# Patient Record
Sex: Male | Born: 1955
Health system: Southern US, Community
[De-identification: ages and names within clinical notes are randomized; demographics above are authoritative.]

## PROBLEM LIST (undated history)

## (undated) DIAGNOSIS — Z9229 Personal history of other drug therapy: Secondary | ICD-10-CM

## (undated) DIAGNOSIS — I2510 Atherosclerotic heart disease of native coronary artery without angina pectoris: Secondary | ICD-10-CM

## (undated) DIAGNOSIS — I480 Paroxysmal atrial fibrillation: Secondary | ICD-10-CM

## (undated) DIAGNOSIS — F1010 Alcohol abuse, uncomplicated: Secondary | ICD-10-CM

## (undated) DIAGNOSIS — I700 Atherosclerosis of aorta: Secondary | ICD-10-CM

## (undated) DIAGNOSIS — E785 Hyperlipidemia, unspecified: Secondary | ICD-10-CM

## (undated) DIAGNOSIS — Z91148 Patient's other noncompliance with medication regimen for other reason: Secondary | ICD-10-CM

## (undated) DIAGNOSIS — I4891 Unspecified atrial fibrillation: Secondary | ICD-10-CM

## (undated) DIAGNOSIS — I639 Cerebral infarction, unspecified: Secondary | ICD-10-CM

## (undated) DIAGNOSIS — Z720 Tobacco use: Secondary | ICD-10-CM

## (undated) DIAGNOSIS — Z7901 Long term (current) use of anticoagulants: Secondary | ICD-10-CM

## (undated) DIAGNOSIS — N189 Chronic kidney disease, unspecified: Secondary | ICD-10-CM

## (undated) DIAGNOSIS — I10 Essential (primary) hypertension: Secondary | ICD-10-CM

## (undated) DIAGNOSIS — K219 Gastro-esophageal reflux disease without esophagitis: Secondary | ICD-10-CM

## (undated) DIAGNOSIS — Z79899 Other long term (current) drug therapy: Secondary | ICD-10-CM

## (undated) DIAGNOSIS — J449 Chronic obstructive pulmonary disease, unspecified: Secondary | ICD-10-CM

## (undated) DIAGNOSIS — M4802 Spinal stenosis, cervical region: Secondary | ICD-10-CM

## (undated) HISTORY — DX: Hyperlipidemia, unspecified: E78.5

## (undated) HISTORY — DX: Tobacco use: Z72.0

## (undated) HISTORY — PX: TONSILLECTOMY: SUR1361

## (undated) HISTORY — DX: Chronic kidney disease, unspecified: N18.9

## (undated) HISTORY — DX: Other long term (current) drug therapy: Z79.899

## (undated) HISTORY — DX: Unspecified atrial fibrillation: I48.91

## (undated) HISTORY — DX: Essential (primary) hypertension: I10

---

## 1998-10-18 ENCOUNTER — Emergency Department (HOSPITAL_COMMUNITY): Admission: EM | Admit: 1998-10-18 | Discharge: 1998-10-18 | Payer: Self-pay | Admitting: Emergency Medicine

## 1998-10-18 ENCOUNTER — Encounter: Payer: Self-pay | Admitting: Emergency Medicine

## 2000-09-10 ENCOUNTER — Emergency Department (HOSPITAL_COMMUNITY): Admission: EM | Admit: 2000-09-10 | Discharge: 2000-09-10 | Payer: Self-pay | Admitting: Emergency Medicine

## 2000-09-10 ENCOUNTER — Encounter: Payer: Self-pay | Admitting: Emergency Medicine

## 2006-12-23 ENCOUNTER — Emergency Department (HOSPITAL_COMMUNITY): Admission: EM | Admit: 2006-12-23 | Discharge: 2006-12-23 | Payer: Self-pay | Admitting: Emergency Medicine
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
## 2009-12-30 ENCOUNTER — Emergency Department (HOSPITAL_COMMUNITY): Admission: EM | Admit: 2009-12-30 | Discharge: 2009-12-30 | Payer: Self-pay | Admitting: Emergency Medicine

## 2010-03-22 ENCOUNTER — Encounter (INDEPENDENT_AMBULATORY_CARE_PROVIDER_SITE_OTHER): Payer: Self-pay | Admitting: Family Medicine

## 2010-03-22 ENCOUNTER — Ambulatory Visit: Payer: Self-pay | Admitting: Internal Medicine

## 2010-03-22 LAB — CONVERTED CEMR LAB
ALT: 18 units/L (ref 0–53)
AST: 23 units/L (ref 0–37)
Albumin: 4.4 g/dL (ref 3.5–5.2)
Alkaline Phosphatase: 58 units/L (ref 39–117)
BUN: 12 mg/dL (ref 6–23)
Basophils Absolute: 0.1 10*3/uL (ref 0.0–0.1)
Basophils Relative: 1 % (ref 0–1)
CO2: 24 meq/L (ref 19–32)
Calcium: 9 mg/dL (ref 8.4–10.5)
Chloride: 104 meq/L (ref 96–112)
Cholesterol: 143 mg/dL (ref 0–200)
Creatinine, Ser: 1.01 mg/dL (ref 0.40–1.50)
Eosinophils Absolute: 0.1 10*3/uL (ref 0.0–0.7)
Eosinophils Relative: 2 % (ref 0–5)
Glucose, Bld: 84 mg/dL (ref 70–99)
HCT: 45.9 % (ref 39.0–52.0)
HDL: 48 mg/dL (ref >39)
Hemoglobin: 15 g/dL (ref 13.0–17.0)
LDL Cholesterol: 83 mg/dL (ref 0–99)
Lymphocytes Relative: 13 % (ref 12–46)
Lymphs Abs: 0.9 10*3/uL (ref 0.7–4.0)
MCHC: 32.7 g/dL (ref 30.0–36.0)
MCV: 98.5 fL (ref 78.0–100.0)
Monocytes Absolute: 0.6 10*3/uL (ref 0.1–1.0)
Monocytes Relative: 8 % (ref 3–12)
Neutro Abs: 5.6 10*3/uL (ref 1.7–7.7)
Neutrophils Relative %: 77 % (ref 43–77)
PSA: 0.54 ng/mL (ref 0.10–4.00)
Platelets: 253 10*3/uL (ref 150–400)
Potassium: 4.2 meq/L (ref 3.5–5.3)
RBC: 4.66 M/uL (ref 4.22–5.81)
RDW: 14.3 % (ref 11.5–15.5)
Sodium: 141 meq/L (ref 135–145)
Total Bilirubin: 0.7 mg/dL (ref 0.3–1.2)
Total CHOL/HDL Ratio: 3
Total Protein: 7.1 g/dL (ref 6.0–8.3)
Triglycerides: 61 mg/dL (ref <150)
VLDL: 12 mg/dL (ref 0–40)
Vit D, 25-Hydroxy: 29 ng/mL — ABNORMAL LOW (ref 30–89)
WBC: 7.3 10*3/uL (ref 4.0–10.5)

## 2010-04-05 ENCOUNTER — Encounter (INDEPENDENT_AMBULATORY_CARE_PROVIDER_SITE_OTHER): Payer: Self-pay | Admitting: Family Medicine

## 2010-04-05 ENCOUNTER — Ambulatory Visit: Payer: Self-pay | Admitting: Internal Medicine

## 2010-04-05 LAB — CONVERTED CEMR LAB
BUN: 14 mg/dL (ref 6–23)
CO2: 27 meq/L (ref 19–32)
Calcium: 9.6 mg/dL (ref 8.4–10.5)
Chloride: 97 meq/L (ref 96–112)
Creatinine, Ser: 1.16 mg/dL (ref 0.40–1.50)
Glucose, Bld: 87 mg/dL (ref 70–99)
Potassium: 4.5 meq/L (ref 3.5–5.3)
Sodium: 135 meq/L (ref 135–145)

## 2010-06-29 ENCOUNTER — Telehealth (INDEPENDENT_AMBULATORY_CARE_PROVIDER_SITE_OTHER): Payer: Self-pay | Admitting: *Deleted

## 2010-06-29 ENCOUNTER — Emergency Department (HOSPITAL_COMMUNITY): Admission: EM | Admit: 2010-06-29 | Discharge: 2010-06-29 | Payer: Self-pay | Admitting: Family Medicine

## 2010-07-05 ENCOUNTER — Encounter (INDEPENDENT_AMBULATORY_CARE_PROVIDER_SITE_OTHER): Payer: Self-pay | Admitting: Family Medicine

## 2010-07-05 ENCOUNTER — Ambulatory Visit: Payer: Self-pay | Admitting: Internal Medicine

## 2010-07-05 LAB — CONVERTED CEMR LAB
BUN: 17 mg/dL (ref 6–23)
CO2: 25 meq/L (ref 19–32)
Calcium: 9.7 mg/dL (ref 8.4–10.5)
Creatinine, Ser: 1.19 mg/dL (ref 0.40–1.50)
Glucose, Bld: 80 mg/dL (ref 70–99)

## 2010-10-20 ENCOUNTER — Encounter (INDEPENDENT_AMBULATORY_CARE_PROVIDER_SITE_OTHER): Payer: Self-pay | Admitting: *Deleted

## 2011-01-20 NOTE — Progress Notes (Signed)
Summary: triage/swollen face/broken tooth/pain  Phone Note Call from Patient   Caller: Patient Reason for Call: Talk to Nurse Summary of Call: Patient states he has a broken front tooth and is in alot of pain...he says his face is swollen and has a hard time breathing out of his nose.  Patient wanting appointment today. Patient advised to go to urgent care since we already have all our acute slots filled and there are no openings in our schedule and the possibility of increasing swelling and compromised airway. Patient agrees and will call and follow up with [place] if needed. Initial call taken by: Conchita Paris,  June 29, 2010 9:23 AM

## 2012-05-16 ENCOUNTER — Ambulatory Visit (HOSPITAL_COMMUNITY)
Admission: RE | Admit: 2012-05-16 | Discharge: 2012-05-16 | Disposition: A | Discharge status: Home / Self Care | Payer: Self-pay | Source: Ambulatory Visit | Attending: Family Medicine | Admitting: Family Medicine

## 2012-05-16 DIAGNOSIS — I10 Essential (primary) hypertension: Secondary | ICD-10-CM | POA: Insufficient documentation

## 2012-05-16 DIAGNOSIS — R42 Dizziness and giddiness: Secondary | ICD-10-CM | POA: Insufficient documentation

## 2012-05-16 DIAGNOSIS — R55 Syncope and collapse: Secondary | ICD-10-CM

## 2012-05-16 DIAGNOSIS — I951 Orthostatic hypotension: Secondary | ICD-10-CM

## 2012-05-16 DIAGNOSIS — F17200 Nicotine dependence, unspecified, uncomplicated: Secondary | ICD-10-CM | POA: Insufficient documentation

## 2012-05-16 NOTE — Progress Notes (Signed)
VASCULAR LAB PRELIMINARY  PRELIMINARY  PRELIMINARY  PRELIMINARY  Carotid duplex completed.    Preliminary report: Bilateral:  No evidence of hemodynamically significant internal carotid artery stenosis.   Vertebral artery flow is antegrade.     Azariel Banik D, RVS 05/16/2012, 1:54 PM

## 2013-02-21 ENCOUNTER — Other Ambulatory Visit: Payer: Self-pay | Admitting: Radiology

## 2013-03-15 ENCOUNTER — Encounter: Payer: Self-pay | Admitting: Cardiology

## 2013-03-15 ENCOUNTER — Ambulatory Visit (INDEPENDENT_AMBULATORY_CARE_PROVIDER_SITE_OTHER): Payer: No Typology Code available for payment source | Admitting: Cardiology

## 2013-03-15 VITALS — BP 118/82 | HR 82 | Ht 73.0 in | Wt 175.2 lb

## 2013-03-15 DIAGNOSIS — R55 Syncope and collapse: Secondary | ICD-10-CM

## 2013-03-15 DIAGNOSIS — E7800 Pure hypercholesterolemia, unspecified: Secondary | ICD-10-CM

## 2013-03-15 DIAGNOSIS — R9431 Abnormal electrocardiogram [ECG] [EKG]: Secondary | ICD-10-CM

## 2013-03-15 NOTE — Progress Notes (Signed)
HPI The patient presents for evaluation of syncope. He has had no prior cardiovascular history. However, he's had 3 episodes of near syncope. He's not quite sure that he lost consciousness completely although one time he hit his head. All 3 episodes have been when he has been lying down and jumps up suddenly. He'll make his several feet and then have a near loss of consciousness. He recovers quickly. He otherwise has not numbness and can be quite active without bringing on any symptoms. He has no chest pain, neck or arm discomfort. He has no palpitations. He has no shortness of breath, PND or orthopnea. He's not been exercising but he has been working particularly recently vigorously without bringing on any symptoms. He is trying to quit smoking. He did see his provider recently. I have reviewed these records.  Of note the patient does describe a chronic and constant tinglingin his left hand without pain or weakness. He reports some neck pain as well with certain positions. He is apparently scheduled to see a neurologist.   No Known Allergies  Current Outpatient Prescriptions  Medication Sig Dispense Refill  . aspirin 81 MG tablet Take 81 mg by mouth daily.      Marland Kitchen ibuprofen (ADVIL,MOTRIN) 200 MG tablet Take 200 mg by mouth every 6 (six) hours as needed for pain.      Marland Kitchen lisinopril (PRINIVIL,ZESTRIL) 20 MG tablet Take 20 mg by mouth daily.       No current facility-administered medications for this visit.    Past Medical History  Diagnosis Date  . Chest pain   . Hypertension   . Limb pain   . Numbness     Past Surgical History  Procedure Laterality Date  . Tonsillectomy      No family history on file.  History   Social History  . Marital Status: Divorced    Spouse Name: N/A    Number of Children: N/A  . Years of Education: N/A   Occupational History  . Not on file.   Social History Main Topics  . Smoking status: Current Every Day Smoker -- 1.00 packs/day for 40 years   Types: Cigarettes  . Smokeless tobacco: Not on file     Comment: pt is using the vapor cigeratte  . Alcohol Use: Yes  . Drug Use: Not on file  . Sexually Active: Not on file   Other Topics Concern  . Not on file   Social History Narrative  . No narrative on file    ROS: positive for dizziness, cough with sputum production, occasional reflux, urinary frequency, hand cramping. Otherwise as stated in the HPI and negative for all other systems.   PHYSICAL EXAM BP 118/82  Pulse 82  Ht 6\' 1"  (1.854 m)  Wt 175 lb 3.2 oz (79.47 kg)  BMI 23.12 kg/m2  SpO2 97% GENERAL:  Well appearing HEENT:  Pupils equal round and reactive, fundi not visualized, oral mucosa unremarkable NECK:  No jugular venous distention, waveform within normal limits, carotid upstroke brisk and symmetric, no bruits, no thyromegaly LYMPHATICS:  No cervical, inguinal adenopathy LUNGS:   Decreased breath sounds, no wheezing.   BACK:  No CVA tenderness CHEST:  Unremarkable HEART:  PMI not displaced or sustained,S1 and S2 within normal limits, no S3, no S4, no clicks, no rubs, no murmurs ABD:  Flat, positive bowel sounds normal in frequency in pitch, no bruits, no rebound, no guarding, no midline pulsatile mass, no hepatomegaly, no splenomegaly EXT:  2 plus  pulses throughout, no edema, no cyanosis no clubbing SKIN:  No rashes no nodules NEURO:  Cranial nerves II through XII grossly intact, motor grossly intact throughout PSYCH:  Cognitively intact, oriented to person place and time  EKG:  Sinus rhythm, , rate 73, axis within normal limits, intervals within the limits except for borderline QTC, premature ventricular contractions, no acute ST-T wave changes.  03/15/2013   ASSESSMENT AND PLAN  SYNCOPE:  This is clearly related to orthostasis. He has learned how to avoid this. No further evaluation is indicated.  TOBACCO ABUSE:  We had a long discussion about this. He's going to try to quit but wouldn't be a little  forward any of the replacement therapies other than the cigarette he is currently doing.  RISK REDUCTION:  Given his family history and risk factors I think exercise treadmill testing would be indicated. However, this would be a substantial cost burden for this patient. He does agree to come back for fasting lipid profile.

## 2013-03-15 NOTE — Patient Instructions (Addendum)
The current medical regimen is effective;  continue present plan and medications.  Please return fasting for lipid profile.  Follow up as needed.  You Can Quit Smoking If you are ready to quit smoking or are thinking about it, congratulations! You have chosen to help yourself be healthier and live longer! There are lots of different ways to quit smoking. Nicotine gum, nicotine patches, a nicotine inhaler, or nicotine nasal spray can help with physical craving. Hypnosis, support groups, and medicines help break the habit of smoking. TIPS TO GET OFF AND STAY OFF CIGARETTES  Learn to predict your moods. Do not let a bad situation be your excuse to have a cigarette. Some situations in your life might tempt you to have a cigarette.  Ask friends and co-workers not to smoke around you.  Make your home smoke-free.  Never have "just one" cigarette. It leads to wanting another and another. Remind yourself of your decision to quit.  On a card, make a list of your reasons for not smoking. Read it at least the same number of times a day as you have a cigarette. Tell yourself everyday, "I do not want to smoke. I choose not to smoke."  Ask someone at home or work to help you with your plan to quit smoking.  Have something planned after you eat or have a cup of coffee. Take a walk or get other exercise to perk you up. This will help to keep you from overeating.  Try a relaxation exercise to calm you down and decrease your stress. Remember, you may be tense and nervous the first two weeks after you quit. This will pass.  Find new activities to keep your hands busy. Play with a pen, coin, or rubber band. Doodle or draw things on paper.  Brush your teeth right after eating. This will help cut down the craving for the taste of tobacco after meals. You can try mouthwash too.  Try gum, breath mints, or diet candy to keep something in your mouth. IF YOU SMOKE AND WANT TO QUIT:  Do not stock up on  cigarettes. Never buy a carton. Wait until one pack is finished before you buy another.  Never carry cigarettes with you at work or at home.  Keep cigarettes as far away from you as possible. Leave them with someone else.  Never carry matches or a lighter with you.  Ask yourself, "Do I need this cigarette or is this just a reflex?"  Bet with someone that you can quit. Put cigarette money in a piggy bank every morning. If you smoke, you give up the money. If you do not smoke, by the end of the week, you keep the money.  Keep trying. It takes 21 days to change a habit!  Talk to your doctor about using medicines to help you quit. These include nicotine replacement gum, lozenges, or skin patches. Document Released: 10/01/2009 Document Revised: 02/27/2012 Document Reviewed: 10/01/2009 [facility] Patient Information 2013 [place], [place].

## 2013-03-17 DIAGNOSIS — R55 Syncope and collapse: Secondary | ICD-10-CM | POA: Insufficient documentation

## 2013-03-27 ENCOUNTER — Encounter: Payer: Self-pay | Admitting: Cardiology

## 2013-04-12 DIAGNOSIS — I10 Essential (primary) hypertension: Secondary | ICD-10-CM | POA: Insufficient documentation

## 2014-06-12 ENCOUNTER — Encounter (HOSPITAL_COMMUNITY): Payer: Self-pay | Admitting: Emergency Medicine

## 2014-06-12 ENCOUNTER — Emergency Department (HOSPITAL_COMMUNITY): Payer: PRIVATE HEALTH INSURANCE

## 2014-06-12 ENCOUNTER — Emergency Department (INDEPENDENT_AMBULATORY_CARE_PROVIDER_SITE_OTHER)
Admission: EM | Admit: 2014-06-12 | Discharge: 2014-06-12 | Disposition: A | Discharge status: Hospice | Payer: PRIVATE HEALTH INSURANCE | Source: Home / Self Care | Attending: Family Medicine | Admitting: Family Medicine

## 2014-06-12 ENCOUNTER — Inpatient Hospital Stay (HOSPITAL_COMMUNITY)
Admission: EM | Admit: 2014-06-12 | Discharge: 2014-06-13 | DRG: 684 | Disposition: A | Discharge status: Home / Self Care | Payer: PRIVATE HEALTH INSURANCE | Attending: Internal Medicine | Admitting: Internal Medicine

## 2014-06-12 DIAGNOSIS — N179 Acute kidney failure, unspecified: Principal | ICD-10-CM | POA: Diagnosis present

## 2014-06-12 DIAGNOSIS — R55 Syncope and collapse: Secondary | ICD-10-CM

## 2014-06-12 DIAGNOSIS — I10 Essential (primary) hypertension: Secondary | ICD-10-CM | POA: Diagnosis present

## 2014-06-12 DIAGNOSIS — X30XXXA Exposure to excessive natural heat, initial encounter: Secondary | ICD-10-CM | POA: Diagnosis present

## 2014-06-12 DIAGNOSIS — T671XXA Heat syncope, initial encounter: Secondary | ICD-10-CM | POA: Diagnosis present

## 2014-06-12 DIAGNOSIS — I951 Orthostatic hypotension: Secondary | ICD-10-CM | POA: Diagnosis present

## 2014-06-12 DIAGNOSIS — E860 Dehydration: Secondary | ICD-10-CM | POA: Diagnosis present

## 2014-06-12 DIAGNOSIS — F17200 Nicotine dependence, unspecified, uncomplicated: Secondary | ICD-10-CM | POA: Diagnosis present

## 2014-06-12 DIAGNOSIS — R9431 Abnormal electrocardiogram [ECG] [EKG]: Secondary | ICD-10-CM | POA: Diagnosis present

## 2014-06-12 LAB — URINALYSIS, ROUTINE W REFLEX MICROSCOPIC
Bilirubin Urine: NEGATIVE
Glucose, UA: NEGATIVE mg/dL
Ketones, ur: NEGATIVE mg/dL
Leukocytes, UA: NEGATIVE
NITRITE: NEGATIVE
PROTEIN: 30 mg/dL — AB
Specific Gravity, Urine: 1.015 (ref 1.005–1.030)
UROBILINOGEN UA: 0.2 mg/dL (ref 0.0–1.0)
pH: 5 (ref 5.0–8.0)

## 2014-06-12 LAB — CBC WITH DIFFERENTIAL/PLATELET
Basophils Absolute: 0 10*3/uL (ref 0.0–0.1)
Basophils Relative: 0 % (ref 0–1)
EOS PCT: 1 % (ref 0–5)
Eosinophils Absolute: 0.1 10*3/uL (ref 0.0–0.7)
HCT: 41.8 % (ref 39.0–52.0)
Hemoglobin: 14.6 g/dL (ref 13.0–17.0)
LYMPHS ABS: 1.3 10*3/uL (ref 0.7–4.0)
LYMPHS PCT: 15 % (ref 12–46)
MCH: 33.3 pg (ref 26.0–34.0)
MCHC: 34.9 g/dL (ref 30.0–36.0)
MCV: 95.2 fL (ref 78.0–100.0)
MONOS PCT: 13 % — AB (ref 3–12)
Monocytes Absolute: 1.1 10*3/uL — ABNORMAL HIGH (ref 0.1–1.0)
Neutro Abs: 5.9 10*3/uL (ref 1.7–7.7)
Neutrophils Relative %: 71 % (ref 43–77)
Platelets: 271 10*3/uL (ref 150–400)
RBC: 4.39 MIL/uL (ref 4.22–5.81)
RDW: 12.8 % (ref 11.5–15.5)
WBC: 8.4 10*3/uL (ref 4.0–10.5)

## 2014-06-12 LAB — POCT I-STAT, CHEM 8
BUN: 41 mg/dL — ABNORMAL HIGH (ref 6–23)
CREATININE: 5.9 mg/dL — AB (ref 0.50–1.35)
Calcium, Ion: 1.25 mmol/L — ABNORMAL HIGH (ref 1.12–1.23)
Chloride: 103 mEq/L (ref 96–112)
Glucose, Bld: 96 mg/dL (ref 70–99)
HCT: 50 % (ref 39.0–52.0)
Hemoglobin: 17 g/dL (ref 13.0–17.0)
Potassium: 4.4 mEq/L (ref 3.7–5.3)
SODIUM: 134 meq/L — AB (ref 137–147)
TCO2: 18 mmol/L (ref 0–100)

## 2014-06-12 LAB — COMPREHENSIVE METABOLIC PANEL
ALT: 14 U/L (ref 0–53)
AST: 24 U/L (ref 0–37)
Albumin: 4.6 g/dL (ref 3.5–5.2)
Alkaline Phosphatase: 60 U/L (ref 39–117)
BILIRUBIN TOTAL: 0.4 mg/dL (ref 0.3–1.2)
BUN: 43 mg/dL — AB (ref 6–23)
CALCIUM: 9.4 mg/dL (ref 8.4–10.5)
CO2: 20 meq/L (ref 19–32)
CREATININE: 4.98 mg/dL — AB (ref 0.50–1.35)
Chloride: 95 mEq/L — ABNORMAL LOW (ref 96–112)
GFR, EST AFRICAN AMERICAN: 13 mL/min — AB (ref >90)
GFR, EST NON AFRICAN AMERICAN: 12 mL/min — AB (ref >90)
GLUCOSE: 91 mg/dL (ref 70–99)
Potassium: 4.8 mEq/L (ref 3.7–5.3)
Sodium: 134 mEq/L — ABNORMAL LOW (ref 137–147)
Total Protein: 7.9 g/dL (ref 6.0–8.3)

## 2014-06-12 LAB — I-STAT TROPONIN, ED: TROPONIN I, POC: 0 ng/mL (ref 0.00–0.08)

## 2014-06-12 LAB — CK TOTAL AND CKMB (NOT AT ARMC)
CK TOTAL: 172 U/L (ref 7–232)
CK, MB: 4.7 ng/mL — ABNORMAL HIGH (ref 0.3–4.0)
Relative Index: 2.7 — ABNORMAL HIGH (ref 0.0–2.5)

## 2014-06-12 LAB — URINE MICROSCOPIC-ADD ON

## 2014-06-12 LAB — CBG MONITORING, ED: Glucose-Capillary: 83 mg/dL (ref 70–99)

## 2014-06-12 LAB — CK: Total CK: 174 U/L (ref 7–232)

## 2014-06-12 IMAGING — CR DG CHEST 2V
2 series · 2 of 2 positions shown · non-contrast
Comparison: Chest x-ray [DATE].

CLINICAL DATA: Loss of consciousness.

EXAM:
CHEST  2 VIEW

[w chest pa]
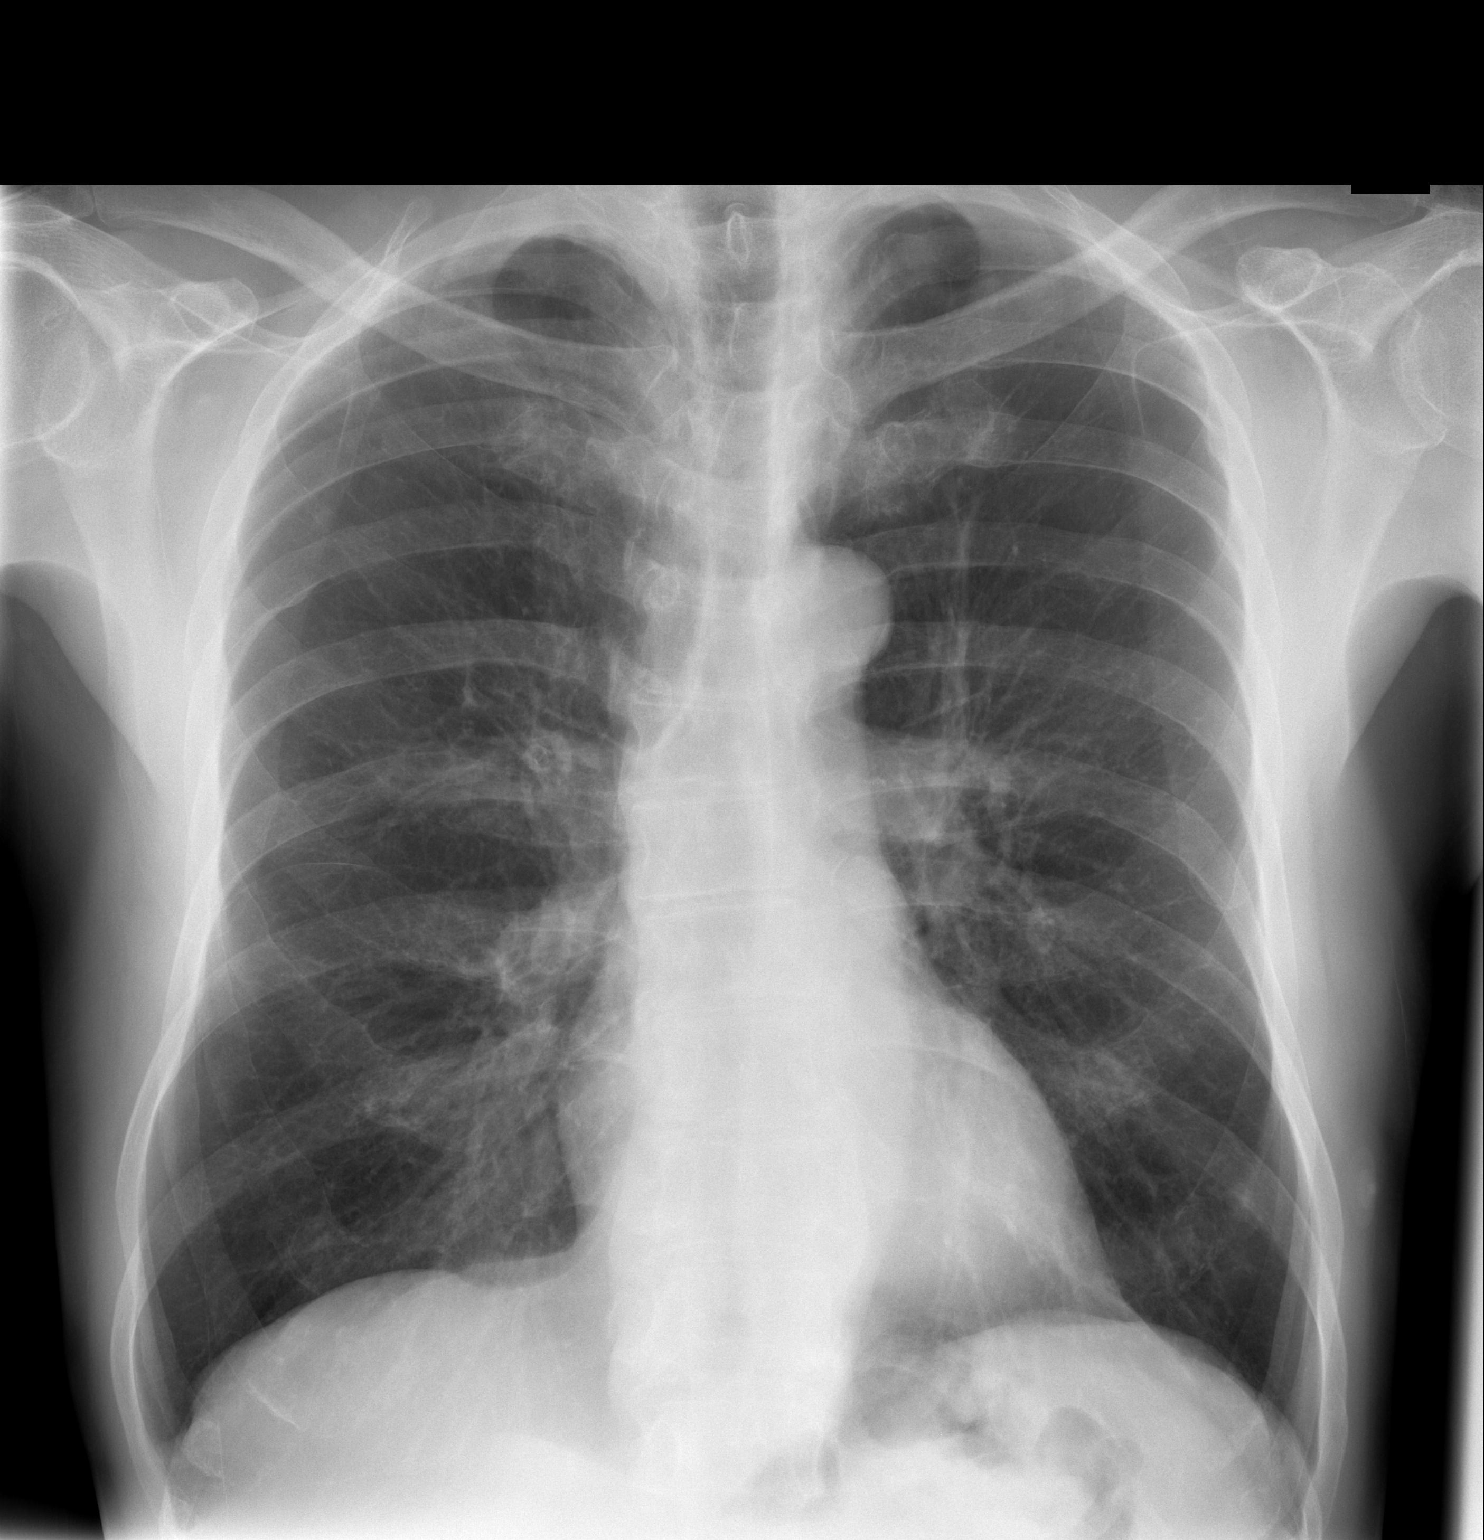

[w chest lat]
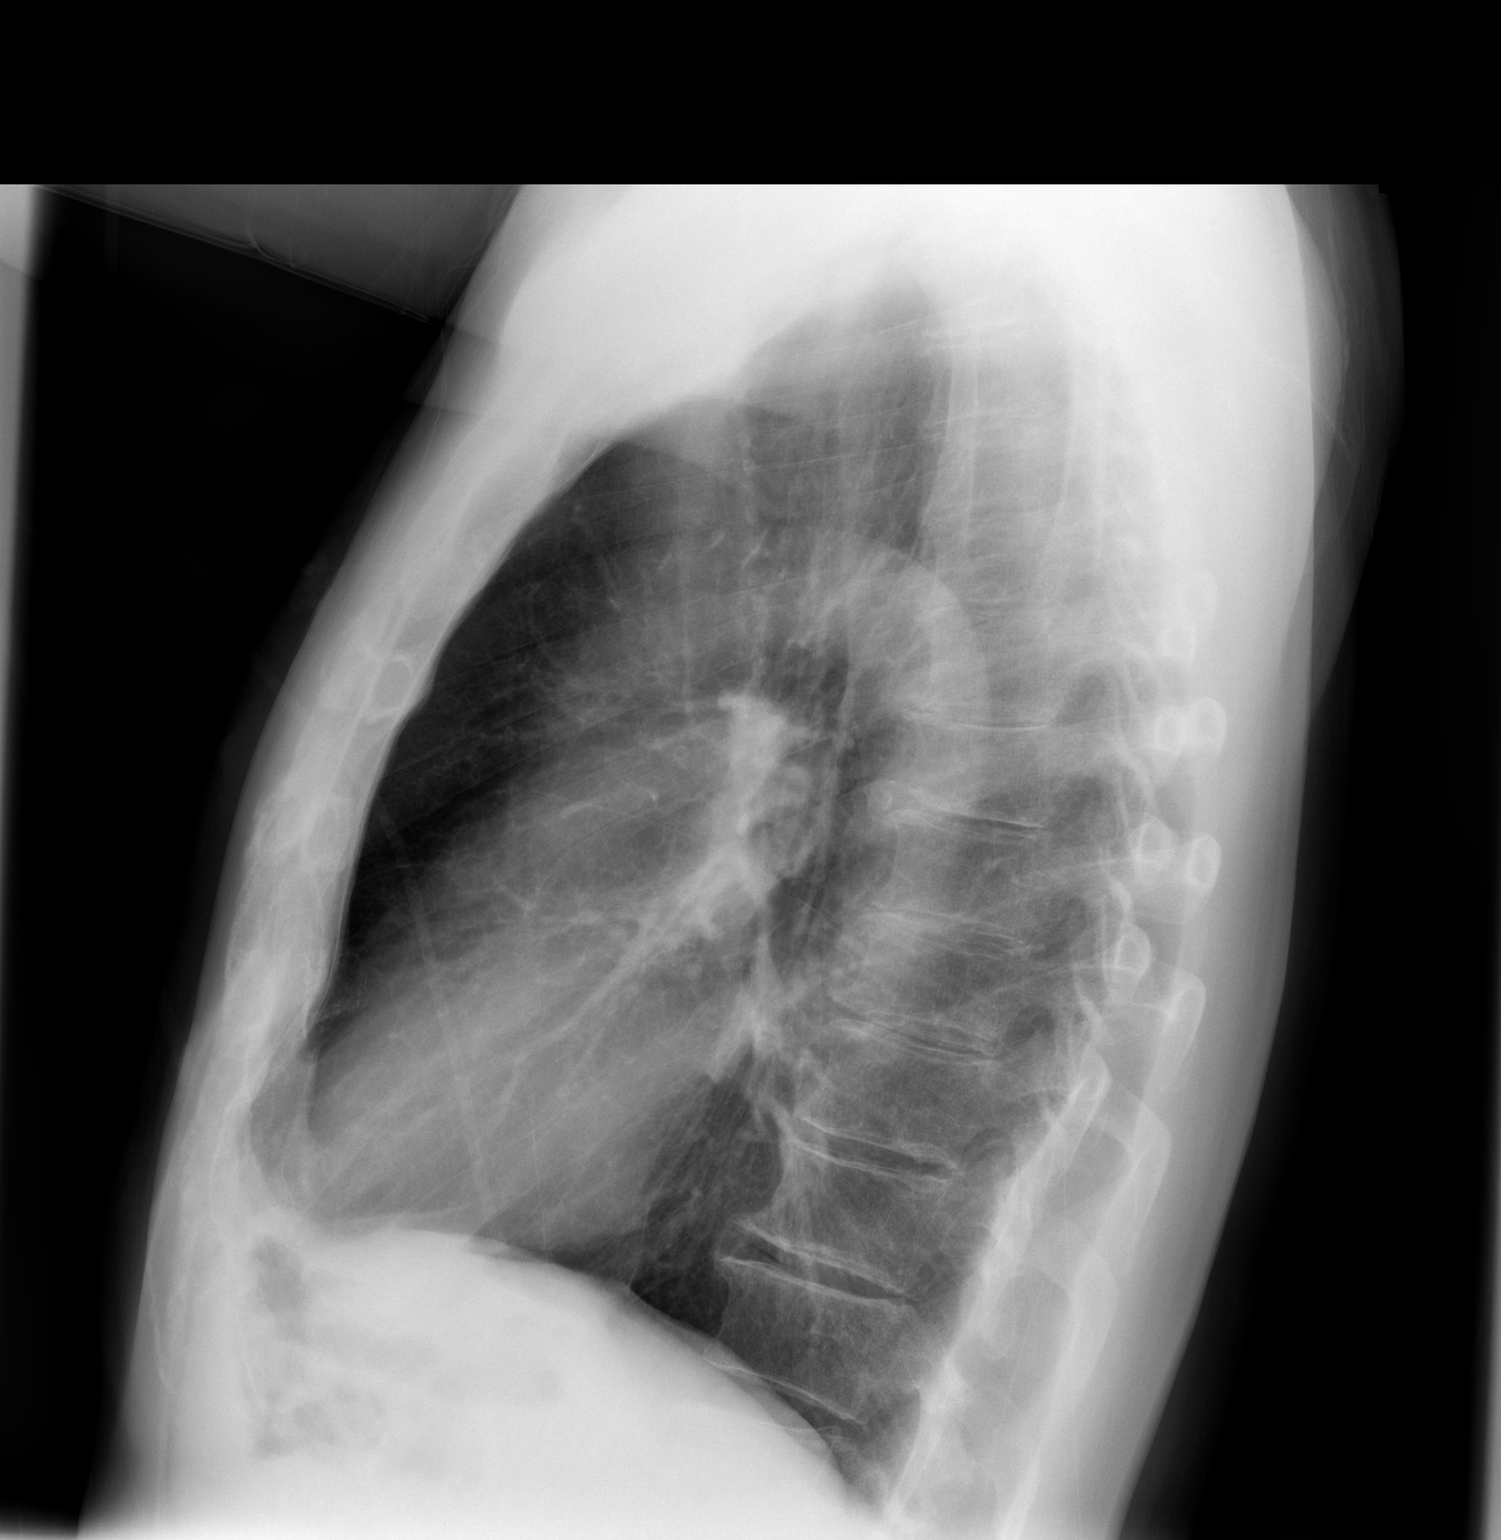

[2 of 2 positions shown; findings below may reference images not displayed]

FINDINGS: Lung volumes are normal. No consolidative airspace disease. No
pleural effusions. No pneumothorax. No pulmonary nodule or mass
noted. Pulmonary vasculature and the cardiomediastinal silhouette
are within normal limits. Atherosclerotic calcifications within the
arch of the aorta.
IMPRESSION: 1. No radiographic evidence of acute cardiopulmonary disease.
2. Atherosclerosis.

## 2014-06-12 MED ORDER — SODIUM CHLORIDE 0.9 % IV SOLN
INTRAVENOUS | Status: DC
  Administered 2014-06-12 – 2014-06-13 (×2): via INTRAVENOUS

## 2014-06-12 MED ORDER — NICOTINE 21 MG/24HR TD PT24
21.0000 mg | MEDICATED_PATCH | Freq: Every day | TRANSDERMAL | Status: DC
  Administered 2014-06-12 – 2014-06-13 (×2): 21 mg via TRANSDERMAL
  Filled 2014-06-12 (×2): qty 1

## 2014-06-12 MED ORDER — SODIUM CHLORIDE 0.9 % IV BOLUS (SEPSIS)
2000.0000 mL | Freq: Once | INTRAVENOUS | Status: AC
  Administered 2014-06-12: 2000 mL via INTRAVENOUS

## 2014-06-12 MED ORDER — SODIUM CHLORIDE 0.9 % IJ SOLN: 3.0000 mL | Freq: Two times a day (BID) | INTRAMUSCULAR | Status: DC

## 2014-06-12 MED ORDER — HEPARIN SODIUM (PORCINE) 5000 UNIT/ML IJ SOLN
5000.0000 [IU] | Freq: Three times a day (TID) | INTRAMUSCULAR | Status: DC
  Administered 2014-06-12 – 2014-06-13 (×2): 5000 [IU] via SUBCUTANEOUS
  Filled 2014-06-12 (×5): qty 1

## 2014-06-12 MED ORDER — ASPIRIN 325 MG PO TABS
325.0000 mg | ORAL_TABLET | ORAL | Status: DC
  Administered 2014-06-12: 325 mg via ORAL
  Filled 2014-06-12: qty 1

## 2014-06-12 MED ORDER — SODIUM CHLORIDE 0.9 % IV BOLUS (SEPSIS): 1000.0000 mL | Freq: Once | INTRAVENOUS | Status: DC

## 2014-06-12 NOTE — ED Notes (Signed)
Pt back from x-ray.

## 2014-06-12 NOTE — ED Notes (Signed)
Attempted report x1. 

## 2014-06-12 NOTE — ED Notes (Signed)
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 Today he had a syncopal episode at work, left the scene and drove himself to Urgent Care. He had positive orthostatic BPs there, UC infused 700 cc of NS and the orthostatics lessened in severity. He has been transferred her because UC suspected kidney damage from dehydration.

## 2014-06-12 NOTE — ED Notes (Signed)
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

## 2014-06-12 NOTE — H&P (Signed)
[facility] History and Physical  Ronald Lowe VEL:[id number] DOB: 11/05/1956 DOA: 06/12/2014  Referring physician: EDP PCP: No PCP Per Patient   Chief Complaint: Syncope   HPI: Ronald Lowe is a 58 y.o. 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  Today he suffered a syncopal episode at work.  He left the scene and drove himself to UC who then took labs and promptly sent him to the ED for AKI.  In the ED he was confirmed to be in AKI, also found to have orthostatic hypotension.  Review of Systems: Systems reviewed.  As above, otherwise negative  Past Medical History  Diagnosis Date  . Hypertension   . Tobacco abuse    Past Surgical History  Procedure Laterality Date  . Tonsillectomy     Social History:  reports that he has been smoking Cigarettes.  He has a 40 pack-year smoking history. He does not have any smokeless tobacco history on file. He reports that he drinks alcohol. His drug history is not on file.  No Known Allergies  Family History  Problem Relation Age of Onset  . Emphysema Father   . Stroke Mother   . CAD Brother 38    CABG     Prior to Admission medications   Medication Sig Start Date End Date Taking? Authorizing Provider  aspirin 325 MG tablet Take 325 mg by mouth 2 (two) times a week.   Yes Historical Provider, MD  ibuprofen (ADVIL,MOTRIN) 200 MG tablet Take 600 mg by mouth daily.    Yes Historical Provider, MD  lisinopril (PRINIVIL,ZESTRIL) 20 MG tablet Take 20 mg by mouth at bedtime.    Yes Historical Provider, MD   Physical Exam: Filed Vitals:   06/12/14 1800  BP: 110/66  Pulse: 87  Resp: 21    BP 110/66  Pulse 87  Resp 21  SpO2 100%  General Appearance:    Alert, oriented, no distress, appears stated age  Head:    Normocephalic, atraumatic  Eyes:    PERRL, EOMI, sclera non-icteric        Nose:   Nares without drainage or epistaxis. Mucosa, turbinates normal  Throat:   Moist  mucous membranes. Oropharynx without erythema or exudate.  Neck:   Supple. No carotid bruits.  No thyromegaly.  No lymphadenopathy.   Back:     No CVA tenderness, no spinal tenderness  Lungs:     Clear to auscultation bilaterally, without wheezes, rhonchi or rales  Chest wall:    No tenderness to palpitation  Heart:    Regular rate and rhythm without murmurs, gallops, rubs  Abdomen:     Soft, non-tender, nondistended, normal bowel sounds, no organomegaly  Genitalia:    deferred  Rectal:    deferred  Extremities:   No clubbing, cyanosis or edema.  Pulses:   2+ and symmetric all extremities  Skin:   Skin color, texture, turgor normal, no rashes or lesions  Lymph nodes:   Cervical, supraclavicular, and axillary nodes normal  Neurologic:   CNII-XII intact. Normal strength, sensation and reflexes      throughout    Labs on Admission:  Basic Metabolic Panel:  Recent Labs Lab 06/12/14 1631 06/12/14 1719  NA 134* 134*  K 4.4 4.8  CL 103 95*  CO2  --  20  GLUCOSE 96 91  BUN 41* 43*  CREATININE 5.90* 4.98*  CALCIUM  --  9.4   Liver Function Tests:  Recent Labs Lab 06/12/14 1719  AST 24  ALT 14  ALKPHOS 60  BILITOT 0.4  PROT 7.9  ALBUMIN 4.6   No results found for this basename: LIPASE, AMYLASE,  in the last 168 hours No results found for this basename: AMMONIA,  in the last 168 hours CBC:  Recent Labs Lab 06/12/14 1631 06/12/14 1719  WBC  --  8.4  NEUTROABS  --  5.9  HGB 17.0 14.6  HCT 50.0 41.8  MCV  --  95.2  PLT  --  271   Cardiac Enzymes:  Recent Labs Lab 06/12/14 1719  CKTOTAL 172  174  CKMB 4.7*    BNP (last 3 results) No results found for this basename: PROBNP,  in the last 8760 hours CBG:  Recent Labs Lab 06/12/14 1722  GLUCAP 83    Radiological Exams on Admission: Dg Chest 2 View  06/12/2014   CLINICAL DATA:  Loss of consciousness.  EXAM: CHEST  2 VIEW  COMPARISON:  Chest x-ray 12/23/2006.  FINDINGS: Lung volumes are normal. No  consolidative airspace disease. No pleural effusions. No pneumothorax. No pulmonary nodule or mass noted. Pulmonary vasculature and the cardiomediastinal silhouette are within normal limits. Atherosclerotic calcifications within the arch of the aorta.  IMPRESSION: 1. No radiographic evidence of acute cardiopulmonary disease. 2. Atherosclerosis.   Electronically Signed   By: Vinnie Langton M.D.   On: 06/12/2014 18:41    EKG: Independently reviewed.  Assessment/Plan Principal Problem:   AKI (acute kidney injury) Active Problems:   Syncope   Orthostatic hypotension   Dehydration, severe   EKG abnormality   HTN (hypertension)   1. Severe Dehydration: causing Acute kidney failure and orthostatic hypotension- UA pending, pre-renal vs ATN, thankfully he is already making urine after IVF resuscitation in the ED.  Hold nephrotoxic lisinopril and NSAID.  Strict intake and output, 3L IVF bolus in ED, and NS going at 75 cc/hr.  Recheck labs in AM.  If no improvement then check urine lytes.  No evidence at this time of hyperkalemia.  Exact baseline unknown but was 1.1 back in 2011.  B renal ultrasound ordered.  If no improvement then would also consult nephrology given severity of AKI. 2. Syncope and EKG abnormality - due to dehydration and orthostatic hypotension most likely.  No cardiac symptoms, but will keep patient on tele monitor due to pan ST elevation.  Although the EKG abnormality is likely just early repol, will also get 2D echo to rule out pericarditis (though I doubt it given no symptoms).  Troponin pending, though again, no SOB, no chest pain.    Code Status: Full Code  Family Communication: Wife at bedside Disposition Plan: Admit to inpatient   Time spent: 77 min  GARDNER, JARED M. [facility] Pager [phone]  If 7AM-7PM, please contact the day team taking care of the patient [email or web address] Password [facility] 06/12/2014, 7:41 PM

## 2014-06-12 NOTE — Progress Notes (Signed)
New Admission Note:  Arrival Method: via stretcher with nurse Tech Mental Orientation: alert and orientedx4 Telemetry: Placed on box 18, notified CCMD Assessment: Completed Skin: dry and intact Pain: denies pain Safety Measures: Safety Fall Prevention Plan was given, discussed and signed. Admission: Completed [address] Orientation: Patient has been orientated to the room, unit and the staff. Family: None at bedside  Orders have been reviewed and implemented. Will continue to monitor the patient. Call light has been placed within reach and bed alarm has been activated.   Leandro Reasoner BSN, RN  Phone Number: [phone] [facility]

## 2014-06-12 NOTE — ED Provider Notes (Signed)
JOHNHENRY Lowe is a 58 y.o. male who presents to Urgent Care today for syncope. Patient suffered a syncopal episode today while working outside. Over the past several days he has become extremely lightheaded. 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 He notes that he is drinking plenty of water and urinating normally. He has a has a history of hypertension and is currently taking lisinopril. He denies any chest pains or palpitations or shortness of breath.   Past Medical History  Diagnosis Date  . Hypertension   . Tobacco abuse    History  Substance Use Topics  . Smoking status: Current Every Day Smoker -- 1.00 packs/day for 40 years    Types: Cigarettes  . Smokeless tobacco: Not on file     Comment: Pt is using the vapor cigeratte  . Alcohol Use: Yes   ROS as above Medications: Current Facility-Administered Medications  Medication Dose Route Frequency Provider Last Rate Last Dose  . sodium chloride 0.9 % bolus 1,000 mL  1,000 mL Intravenous Once Gregor Hams, MD       Current Outpatient Prescriptions  Medication Sig Dispense Refill  . aspirin 81 MG tablet Take 81 mg by mouth daily.      Marland Kitchen ibuprofen (ADVIL,MOTRIN) 200 MG tablet Take 200 mg by mouth every 6 (six) hours as needed for pain.      Marland Kitchen lisinopril (PRINIVIL,ZESTRIL) 20 MG tablet Take 20 mg by mouth daily.        Exam:  BP 92/55  Pulse 110  Temp(Src) 97.5 F (36.4 C) (Oral)  Resp 18  SpO2 100% Orthostatic Lying - BP- Lying: 81/58 mmHg ; Pulse- Lying: 89  Orthostatic Sitting - BP- Sitting: 120/108 mmHg ; Pulse- Sitting: 105  Orthostatic Standing at 0 minutes - BP- Standing at 0 minutes: 87/34 mmHg ; Pulse- Standing at 0 minutes: 150   Gen: Well NAD HEENT: EOMI,  MMM Lungs: Normal work of breathing. CTABL Heart: RRR no MRG Abd: NABS, Soft. NT, ND Exts: Brisk capillary refill, warm and well perfused.   Twelve-lead EKG shows normal sinus rhythm at 88 beats per minute. No significant abnormalities.  Results for orders  placed during the hospital encounter of 06/12/14 (from the past 24 hour(s))  POCT I-STAT, CHEM 8     Status: Abnormal   Collection Time    06/12/14  4:31 PM      Result Value Ref Range   Sodium 134 (*) 137 - 147 mEq/L   Potassium 4.4  3.7 - 5.3 mEq/L   Chloride 103  96 - 112 mEq/L   BUN 41 (*) 6 - 23 mg/dL   Creatinine, Ser 5.90 (*) 0.50 - 1.35 mg/dL   Glucose, Bld 96  70 - 99 mg/dL   Calcium, Ion 1.25 (*) 1.12 - 1.23 mmol/L   TCO2 18  0 - 100 mmol/L   Hemoglobin 17.0  13.0 - 17.0 g/dL   HCT 50.0  39.0 - 52.0 %   No results found.  Assessment and Plan: 58 y.o. male with syncope with severe orthostatic hypotension.  Patient additionally has acute kidney failure also likely as a result of the dehydration This is exacerbated by lisinopril.  Plan to start an IV and transferred to the emergency room for evaluation and management.   Discussed warning signs or symptoms. Please see discharge instructions. Patient expresses understanding.    Gregor Hams, MD 06/12/14 [phone]

## 2014-06-12 NOTE — ED Provider Notes (Signed)
CSN: [id number]     Arrival date & time 06/12/14  1707 History   First MD Initiated Contact with Patient 06/12/14 1715     Chief Complaint  Patient presents with  . Loss of Consciousness     (Consider location/radiation/quality/duration/timing/severity/associated sxs/prior Treatment) The history is provided by the patient.    This is a 58 year old male who presents to the emergency department for chief complaint of syncope. Past medical history of hypertension, tobacco abuse. The patient has been working outside in the heat for the past several days. The patient states that he has been drinking beer, water and Gatorade daily for the past several days. She states that over the past several days he has had multiple episodes of orthostatic dizziness, worsening cramps especially at night after being in the sun all day, and fatigue. Patient states that he cannot remember the last time he urinated. Today he was out in the heat and syncopized by his work truck. The patient was found to have positive orthostatic hypotension at the urgent care as well as acute kidney injury.  Past Medical History  Diagnosis Date  . Hypertension   . Tobacco abuse    Past Surgical History  Procedure Laterality Date  . Tonsillectomy     Family History  Problem Relation Age of Onset  . Emphysema Father   . Stroke Mother   . CAD Brother 51    CABG   History  Substance Use Topics  . Smoking status: Current Every Day Smoker -- 1.00 packs/day for 40 years    Types: Cigarettes  . Smokeless tobacco: Not on file     Comment: Pt is using the vapor cigeratte  . Alcohol Use: Yes    Review of Systems  Ten systems reviewed and are negative for acute change, except as noted in the HPI.    Allergies  Review of patient's allergies indicates no known allergies.  Home Medications   Prior to Admission medications   Medication Sig Start Date End Date Taking? Authorizing Provider  aspirin 325 MG tablet Take 325  mg by mouth 2 (two) times a week.   Yes Historical Provider, MD  ibuprofen (ADVIL,MOTRIN) 200 MG tablet Take 600 mg by mouth daily.    Yes Historical Provider, MD  lisinopril (PRINIVIL,ZESTRIL) 20 MG tablet Take 20 mg by mouth at bedtime.    Yes Historical Provider, MD   BP 110/66  Pulse 87  Resp 21  SpO2 100% Physical Exam  Nursing note and vitals reviewed. Constitutional: He appears well-developed and well-nourished. No distress.  HENT:  Head: Normocephalic and atraumatic.  Eyes: Conjunctivae are normal. No scleral icterus.  Neck: Normal range of motion. Neck supple.  Cardiovascular: Normal rate, regular rhythm and normal heart sounds.   Pulmonary/Chest: Effort normal and breath sounds normal. No respiratory distress.  Abdominal: Soft. There is no tenderness.  Musculoskeletal: He exhibits no edema.  Neurological: He is alert.  Skin: Skin is warm and dry. He is not diaphoretic.  Psychiatric: His behavior is normal.    ED Course  Procedures (including critical care time) Labs Review Labs Reviewed  CK TOTAL AND CKMB - Abnormal; Notable for the following:    CK, MB 4.7 (*)    Relative Index 2.7 (*)    All other components within normal limits  CBC WITH DIFFERENTIAL - Abnormal; Notable for the following:    Monocytes Relative 13 (*)    Monocytes Absolute 1.1 (*)    All other components within normal limits  COMPREHENSIVE METABOLIC PANEL - Abnormal; Notable for the following:    Sodium 134 (*)    Chloride 95 (*)    BUN 43 (*)    Creatinine, Ser 4.98 (*)    GFR calc non Af Amer 12 (*)    GFR calc Af Amer 13 (*)    All other components within normal limits  CK  URINALYSIS, ROUTINE W REFLEX MICROSCOPIC  POCT CBG (FASTING - GLUCOSE)-MANUAL ENTRY  CBG MONITORING, ED    Imaging Review Dg Chest 2 View  06/12/2014   CLINICAL DATA:  Loss of consciousness.  EXAM: CHEST  2 VIEW  COMPARISON:  Chest x-ray 12/23/2006.  FINDINGS: Lung volumes are normal. No consolidative airspace  disease. No pleural effusions. No pneumothorax. No pulmonary nodule or mass noted. Pulmonary vasculature and the cardiomediastinal silhouette are within normal limits. Atherosclerotic calcifications within the arch of the aorta.  IMPRESSION: 1. No radiographic evidence of acute cardiopulmonary disease. 2. Atherosclerosis.   Electronically Signed   By: Vinnie Langton M.D.   On: 06/12/2014 18:41     EKG Interpretation   Date/Time:  Thursday June 12 2014 17:09:16 EDT Ventricular Rate:  76 PR Interval:  146 QRS Duration: 100 QT Interval:  396 QTC Calculation: 445 R Axis:   78 Text Interpretation:  Sinus rhythm ST elevation suggests acute  pericarditis No significant change since last tracing Confirmed by  Maryan Rued  MD, Loree Fee ([id number]) on 06/12/2014 6:16:44 PM      MDM   Final diagnoses:  Heat causing syncope  Acute kidney injury   Patient with AKI, likely due to dehydration and heat exposure, no concern for rhabdo. claculated gfr odf 12 at this time.  CXR unremarkable. Diffuse ST elevations. EKG shows changes from Previous. No cp or sob. I have troponins pending. Patient will be admitted for acute renal failure. Discussed finding with the patient and his wife who express their understanding and agree with plan of care.  Dr. Alcario Drought will accept for admission.  Patient / Family / Caregiver informed of clinical course, understand medical decision-making process, and agree with plan.  I personally reviewed the imaging tests through PACS system. I have reviewed and interpreted Lab values. I reviewed available ER/hospitalization records through the [place], [place] 06/16/14 1721

## 2014-06-13 ENCOUNTER — Inpatient Hospital Stay (HOSPITAL_COMMUNITY): Payer: PRIVATE HEALTH INSURANCE

## 2014-06-13 LAB — BASIC METABOLIC PANEL
BUN: 45 mg/dL — AB (ref 6–23)
CO2: 15 meq/L — AB (ref 19–32)
Calcium: 7.9 mg/dL — ABNORMAL LOW (ref 8.4–10.5)
Chloride: 102 mEq/L (ref 96–112)
Creatinine, Ser: 3.74 mg/dL — ABNORMAL HIGH (ref 0.50–1.35)
GFR calc Af Amer: 19 mL/min — ABNORMAL LOW (ref >90)
GFR calc non Af Amer: 16 mL/min — ABNORMAL LOW (ref >90)
Glucose, Bld: 94 mg/dL (ref 70–99)
Potassium: 4 mEq/L (ref 3.7–5.3)
Sodium: 136 mEq/L — ABNORMAL LOW (ref 137–147)

## 2014-06-13 LAB — CBC
HCT: 35.1 % — ABNORMAL LOW (ref 39.0–52.0)
Hemoglobin: 12 g/dL — ABNORMAL LOW (ref 13.0–17.0)
MCH: 32.5 pg (ref 26.0–34.0)
MCHC: 34.2 g/dL (ref 30.0–36.0)
MCV: 95.1 fL (ref 78.0–100.0)
Platelets: 203 10*3/uL (ref 150–400)
RBC: 3.69 MIL/uL — AB (ref 4.22–5.81)
RDW: 13.1 % (ref 11.5–15.5)
WBC: 5.4 10*3/uL (ref 4.0–10.5)

## 2014-06-13 IMAGING — US US RENAL
1 series · 14 of 16 positions shown · non-contrast
Comparison: None.

CLINICAL DATA: Elevated creatinine.

EXAM:
RENAL/URINARY TRACT ULTRASOUND COMPLETE

[Series 1: us renal · 0.20mm/px · 14 of 16 slices shown]
[im 1/16]
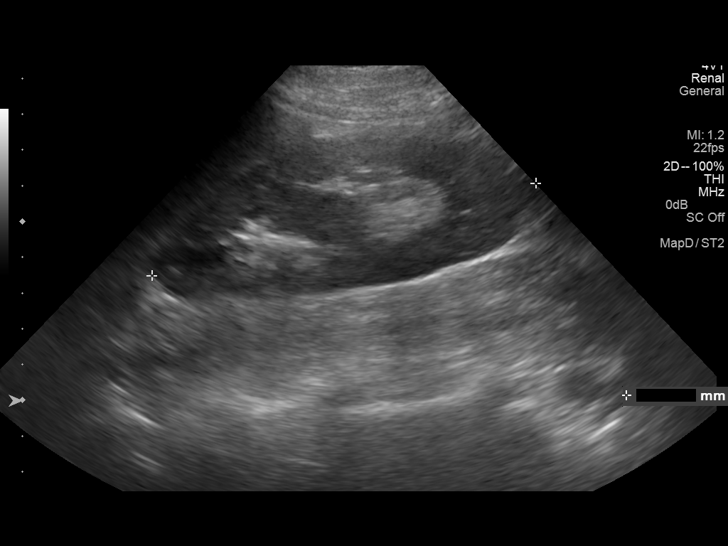
[im 2/16]
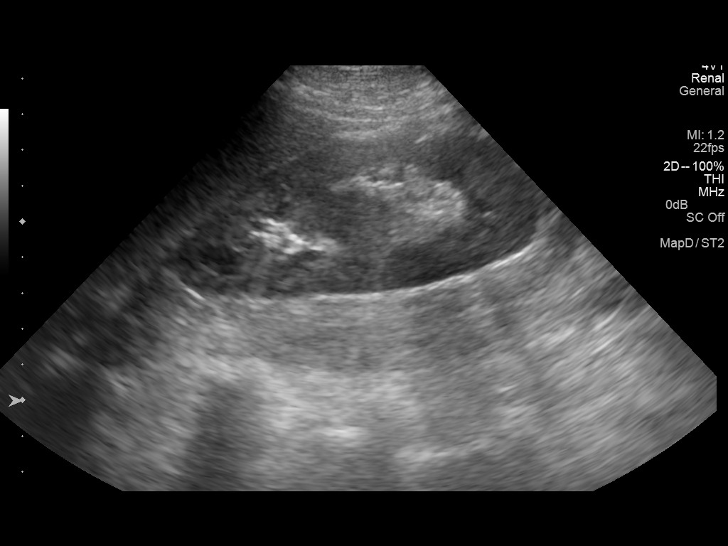
[im 3/16]
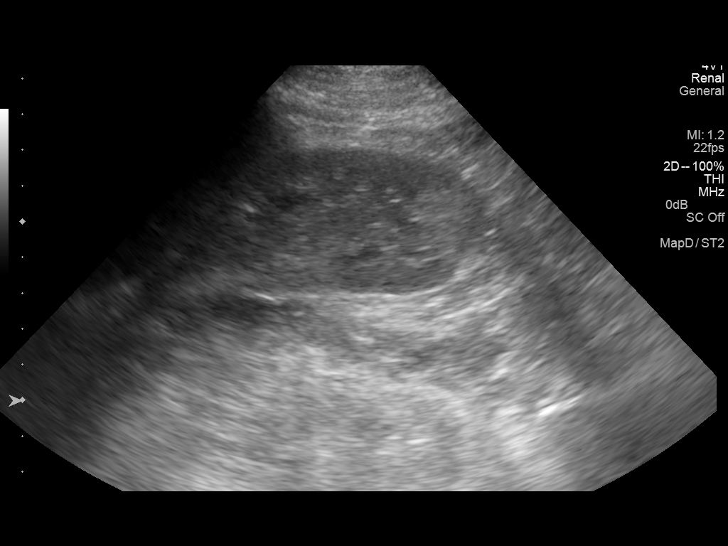
[im 5/16]
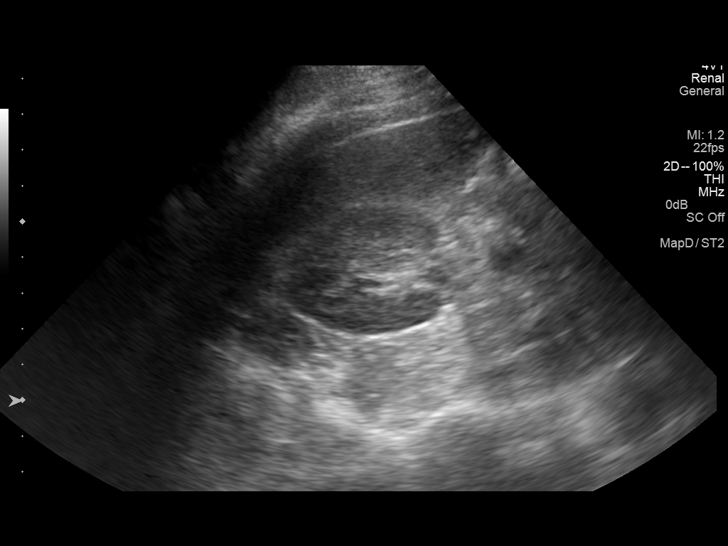
[im 6/16]
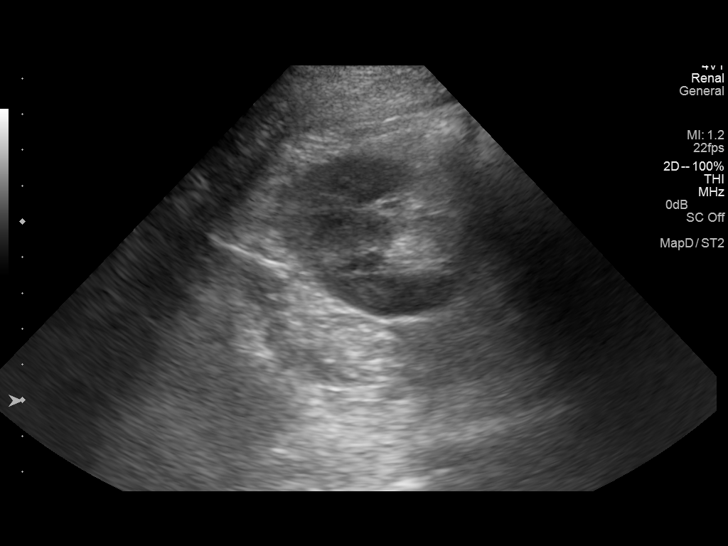
[im 7/16]
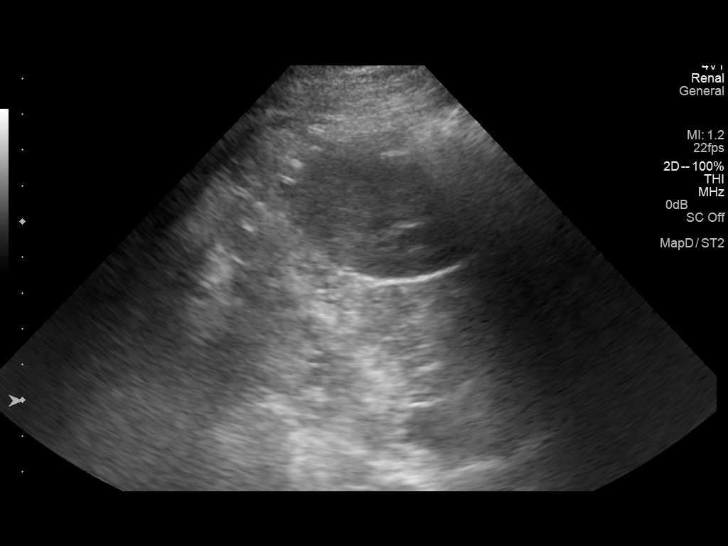
[im 8/16]
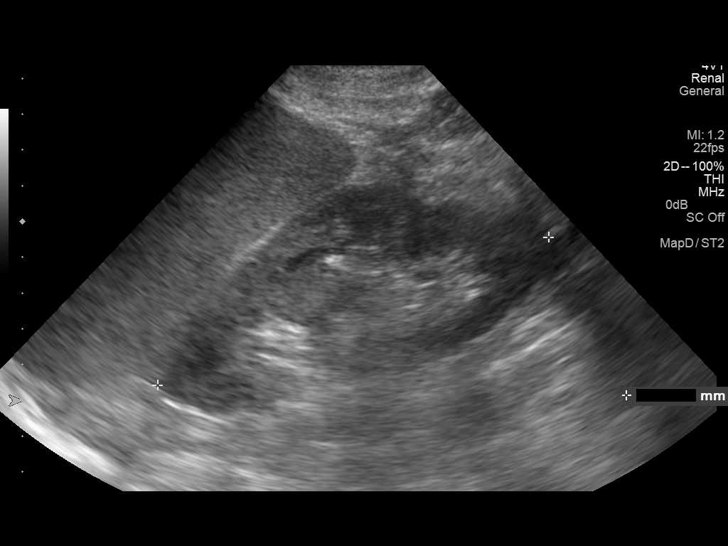
[im 9/16]
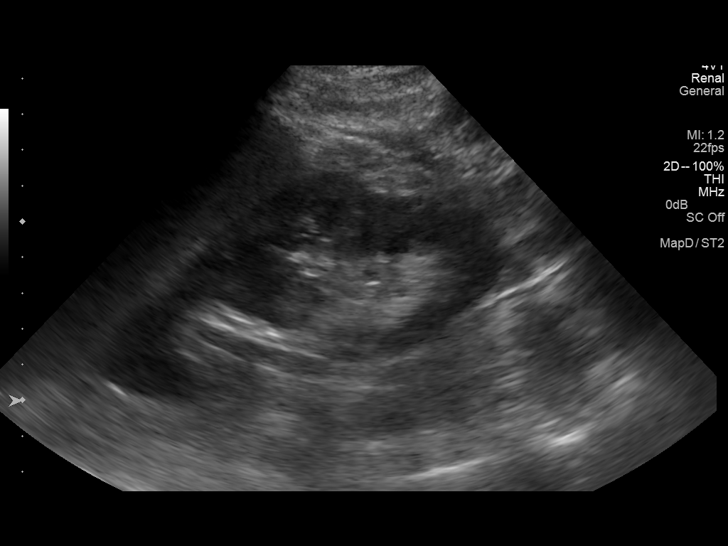
[im 10/16]
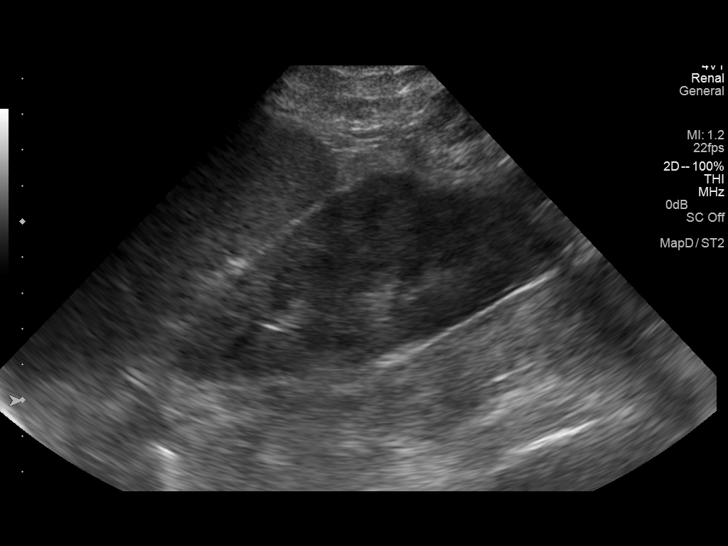
[im 11/16]
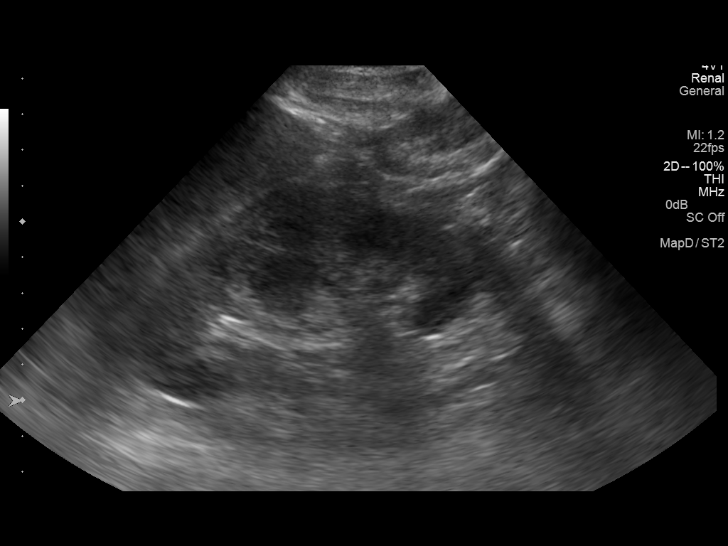
[im 13/16]
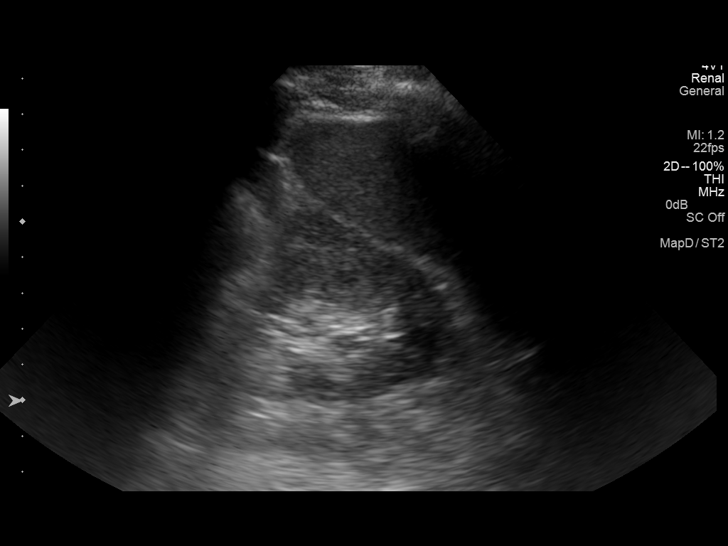
[im 14/16]
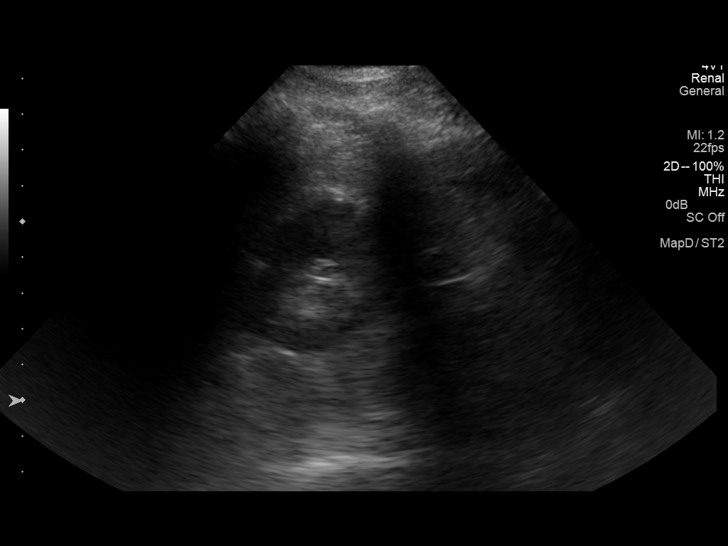
[im 15/16]
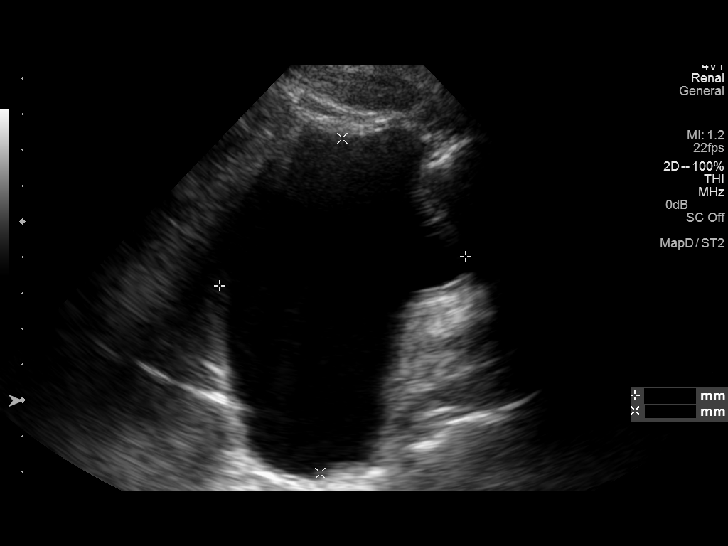
[im 16/16]
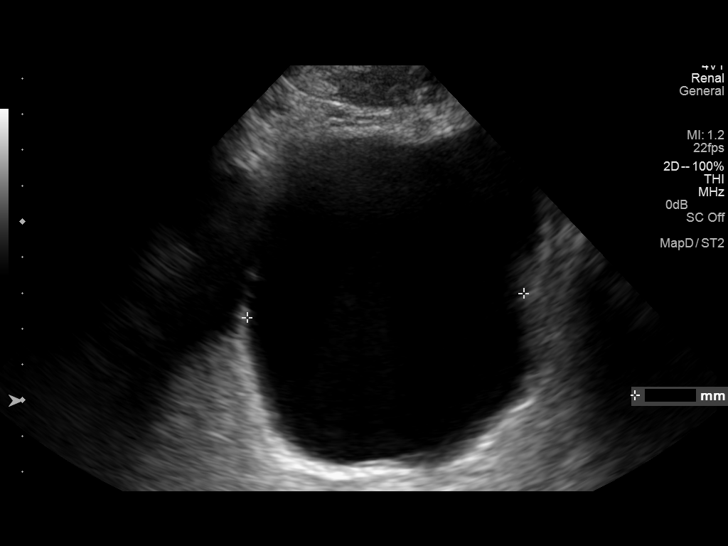

[14 of 16 positions shown; findings below may reference images not displayed]

FINDINGS: Right Kidney:

Length: 11.1 cm. Echogenicity within normal limits. No mass or
hydronephrosis visualized.

Left Kidney:

Length: 11.7 cm. Echogenicity within normal limits. No mass or
hydronephrosis visualized.

Bladder:

Appears normal for degree of bladder distention. Prevoid volume is
252 mm. No postvoid images obtained.
IMPRESSION: Normal ultrasound appearance of the kidneys and bladder. No
hydronephrosis.

## 2014-06-13 MED ORDER — HYDRALAZINE HCL 20 MG/ML IJ SOLN
5.0000 mg | Freq: Four times a day (QID) | INTRAMUSCULAR | Status: DC | PRN
Start: 2014-06-13 — End: 2014-06-13
  Administered 2014-06-13: 5 mg via INTRAVENOUS
  Filled 2014-06-13: qty 1

## 2014-06-13 NOTE — Progress Notes (Signed)
Utilization Review Completed.Dowell, Deborah T6/26/2015  

## 2014-06-13 NOTE — Discharge Summary (Signed)
Physician Discharge Summary  TRACE WIRICK MRN: [id number] DOB/AGE: 1956-05-01 58 y.o.  PCP: No PCP Per Patient   Admit date: 06/12/2014 Discharge date: 06/13/2014  Discharge Diagnoses:      AKI (acute kidney injury) Active Problems:   Syncope   Orthostatic hypotension   Dehydration, severe   EKG abnormality   HTN (hypertension)     Medication List    STOP taking these medications       ibuprofen 200 MG tablet  Commonly known as:  ADVIL,MOTRIN     lisinopril 20 MG tablet  Commonly known as:  PRINIVIL,ZESTRIL      TAKE these medications       aspirin 325 MG tablet  Take 325 mg by mouth 2 (two) times a week.        Discharge Condition: Stable  Disposition: [facility]-Hospice/Medical Facility   Consults: None   Significant Diagnostic Studies: Dg Chest 2 View  06/12/2014   CLINICAL DATA:  Loss of consciousness.  EXAM: CHEST  2 VIEW  COMPARISON:  Chest x-ray 12/23/2006.  FINDINGS: Lung volumes are normal. No consolidative airspace disease. No pleural effusions. No pneumothorax. No pulmonary nodule or mass noted. Pulmonary vasculature and the cardiomediastinal silhouette are within normal limits. Atherosclerotic calcifications within the arch of the aorta.  IMPRESSION: 1. No radiographic evidence of acute cardiopulmonary disease. 2. Atherosclerosis.   Electronically Signed   By: Vinnie Langton M.D.   On: 06/12/2014 18:41   US Renal  06/13/2014   CLINICAL DATA:  Elevated creatinine.  EXAM: RENAL/URINARY TRACT ULTRASOUND COMPLETE  COMPARISON:  None.  FINDINGS: Right Kidney:  Length: 11.1 cm. Echogenicity within normal limits. No mass or hydronephrosis visualized.  Left Kidney:  Length: 11.7 cm. Echogenicity within normal limits. No mass or hydronephrosis visualized.  Bladder:  Appears normal for degree of bladder distention. Prevoid volume is 252 mm. No postvoid images obtained.  IMPRESSION: Normal ultrasound appearance of the kidneys and bladder. No hydronephrosis.    Electronically Signed   By: Lucienne Capers M.D.   On: 06/13/2014 05:26      Microbiology: No results found for this or any previous visit (from the past 240 hour(s)).   Labs: Results for orders placed during the hospital encounter of 06/12/14 (from the past 48 hour(s))  CK TOTAL AND CKMB     Status: Abnormal   Collection Time    06/12/14  5:19 PM      Result Value Ref Range   Total CK 172  7 - 232 U/L   CK, MB 4.7 (*) 0.3 - 4.0 ng/mL   Relative Index 2.7 (*) 0.0 - 2.5  CK     Status: None   Collection Time    06/12/14  5:19 PM      Result Value Ref Range   Total CK 174  7 - 232 U/L  CBC WITH DIFFERENTIAL     Status: Abnormal   Collection Time    06/12/14  5:19 PM      Result Value Ref Range   WBC 8.4  4.0 - 10.5 K/uL   RBC 4.39  4.22 - 5.81 MIL/uL   Hemoglobin 14.6  13.0 - 17.0 g/dL   HCT 41.8  39.0 - 52.0 %   MCV 95.2  78.0 - 100.0 fL   MCH 33.3  26.0 - 34.0 pg   MCHC 34.9  30.0 - 36.0 g/dL   RDW 12.8  11.5 - 15.5 %   Platelets 271  150 - 400  K/uL   Neutrophils Relative % 71  43 - 77 %   Neutro Abs 5.9  1.7 - 7.7 K/uL   Lymphocytes Relative 15  12 - 46 %   Lymphs Abs 1.3  0.7 - 4.0 K/uL   Monocytes Relative 13 (*) 3 - 12 %   Monocytes Absolute 1.1 (*) 0.1 - 1.0 K/uL   Eosinophils Relative 1  0 - 5 %   Eosinophils Absolute 0.1  0.0 - 0.7 K/uL   Basophils Relative 0  0 - 1 %   Basophils Absolute 0.0  0.0 - 0.1 K/uL  COMPREHENSIVE METABOLIC PANEL     Status: Abnormal   Collection Time    06/12/14  5:19 PM      Result Value Ref Range   Sodium 134 (*) 137 - 147 mEq/L   Potassium 4.8  3.7 - 5.3 mEq/L   Chloride 95 (*) 96 - 112 mEq/L   CO2 20  19 - 32 mEq/L   Glucose, Bld 91  70 - 99 mg/dL   BUN 43 (*) 6 - 23 mg/dL   Creatinine, Ser 4.98 (*) 0.50 - 1.35 mg/dL   Calcium 9.4  8.4 - 10.5 mg/dL   Total Protein 7.9  6.0 - 8.3 g/dL   Albumin 4.6  3.5 - 5.2 g/dL   AST 24  0 - 37 U/L   ALT 14  0 - 53 U/L   Alkaline Phosphatase 60  39 - 117 U/L   Total Bilirubin 0.4   0.3 - 1.2 mg/dL   GFR calc non Af Amer 12 (*) >90 mL/min   GFR calc Af Amer 13 (*) >90 mL/min   Comment: (NOTE)     The eGFR has been calculated using the CKD EPI equation.     This calculation has not been validated in all clinical situations.     eGFR's persistently <90 mL/min signify possible Chronic Kidney     Disease.  CBG MONITORING, ED     Status: None   Collection Time    06/12/14  5:22 PM      Result Value Ref Range   Glucose-Capillary 83  70 - 99 mg/dL  I-STAT TROPOININ, ED     Status: None   Collection Time    06/12/14  7:38 PM      Result Value Ref Range   Troponin i, poc 0.00  0.00 - 0.08 ng/mL   Comment 3            Comment: Due to the release kinetics of cTnI,     a negative result within the first hours     of the onset of symptoms does not rule out     myocardial infarction with certainty.     If myocardial infarction is still suspected,     repeat the test at appropriate intervals.  URINALYSIS, ROUTINE W REFLEX MICROSCOPIC     Status: Abnormal   Collection Time    06/12/14  7:43 PM      Result Value Ref Range   Color, Urine YELLOW  YELLOW   APPearance CLEAR  CLEAR   Specific Gravity, Urine 1.015  1.005 - 1.030   pH 5.0  5.0 - 8.0   Glucose, UA NEGATIVE  NEGATIVE mg/dL   Hgb urine dipstick SMALL (*) NEGATIVE   Bilirubin Urine NEGATIVE  NEGATIVE   Ketones, ur NEGATIVE  NEGATIVE mg/dL   Protein, ur 30 (*) NEGATIVE mg/dL   Urobilinogen, UA 0.2  0.0 - 1.0  mg/dL   Nitrite NEGATIVE  NEGATIVE   Leukocytes, UA NEGATIVE  NEGATIVE  URINE MICROSCOPIC-ADD ON     Status: Abnormal   Collection Time    06/12/14  7:43 PM      Result Value Ref Range   Squamous Epithelial / LPF FEW (*) RARE   WBC, UA 0-2  <3 WBC/hpf   Bacteria, UA FEW (*) RARE   Casts HYALINE CASTS (*) NEGATIVE  CBC     Status: Abnormal   Collection Time    06/13/14  2:45 AM      Result Value Ref Range   WBC 5.4  4.0 - 10.5 K/uL   RBC 3.69 (*) 4.22 - 5.81 MIL/uL   Hemoglobin 12.0 (*) 13.0 - 17.0  g/dL   Comment: DELTA CHECK NOTED     REPEATED TO VERIFY   HCT 35.1 (*) 39.0 - 52.0 %   MCV 95.1  78.0 - 100.0 fL   MCH 32.5  26.0 - 34.0 pg   MCHC 34.2  30.0 - 36.0 g/dL   RDW 13.1  11.5 - 15.5 %   Platelets 203  150 - 400 K/uL   Comment: DELTA CHECK NOTED     REPEATED TO VERIFY  BASIC METABOLIC PANEL     Status: Abnormal   Collection Time    06/13/14  2:45 AM      Result Value Ref Range   Sodium 136 (*) 137 - 147 mEq/L   Potassium 4.0  3.7 - 5.3 mEq/L   Chloride 102  96 - 112 mEq/L   CO2 15 (*) 19 - 32 mEq/L   Glucose, Bld 94  70 - 99 mg/dL   BUN 45 (*) 6 - 23 mg/dL   Creatinine, Ser 3.74 (*) 0.50 - 1.35 mg/dL   Calcium 7.9 (*) 8.4 - 10.5 mg/dL   GFR calc non Af Amer 16 (*) >90 mL/min   GFR calc Af Amer 19 (*) >90 mL/min   Comment: (NOTE)     The eGFR has been calculated using the CKD EPI equation.     This calculation has not been validated in all clinical situations.     eGFR's persistently <90 mL/min signify possible Chronic Kidney     Disease.     HPI  58 year old male with history of hypertension admitted yesterday after a syncopal episode. The patient was admitted for orthostatic hypotension and acute kidney injury   HOSPITAL COURSE: Acute renal failure/prerenal Initial creatinine was found to be 5.9, patient was found to be orthostatic upon presentation This is likely secondary to dehydration Patient started making urine after volume resuscitation He regularly takes ibuprofen, 3 tablets on a daily basis He is also an ACE inhibitor Patient instructed to discontinue ibuprofen and hold lisinopril until a repeat BMP at his PCPs office Creatinine today prior to discharge is 3.74. The patient has had 1825 cc of urine output over the last 24 hours Instructed to follow up with PCP closely CK was normal, renal ultrasound was negative without any hydronephrosis  Syncope Likely secondary to orthostatic hypotension, cardiac enzymes negative, 2-D echo pending at this  time, previous echo done on 5/13 showed EF of 60-65% Blood pressure stable off lisinopril Resume lisinopril after PCP followup if creatinine normalizes   Discharge Exam:   Blood pressure 137/87, pulse 80, temperature 97.8 F (36.6 C), temperature source Oral, resp. rate 18, height 6' (1.829 m), weight 73.7 kg (162 lb 7.7 oz), SpO2 99.00%.  Gen: Well NAD  HEENT: EOMI,  MMM  Lungs: Normal work of breathing. CTABL  Heart: RRR no MRG  Abd: NABS, Soft. NT, ND  Exts: Brisk capillary refill, warm and well perfused.         Discharge Instructions   Diet - low sodium heart healthy    Complete by:  As directed      Increase activity slowly    Complete by:  As directed            Follow-up Information   Follow up with PCP In 1 week.      SignedReyne Dumas 06/13/2014, 1:53 PM

## 2014-06-13 NOTE — Progress Notes (Signed)
5 mg of Hydralazine given for BP of 169/90, passed information to morning RN.

## 2014-06-13 NOTE — Progress Notes (Signed)
Pt discharge instructions given, pt verbalized understanding.  VSS. Denies pain.  Pt left floor via wheelchair accompanied by staff and family. 

## 2014-06-13 NOTE — Discharge Instructions (Signed)
Discontinue ibuprofen Hold lisinopril until kidney function is rechecked by primary care provider

## 2014-06-17 NOTE — ED Provider Notes (Signed)
Medical screening examination/treatment/procedure(s) were performed by non-physician practitioner and as supervising physician I was immediately available for consultation/collaboration.   EKG Interpretation   Date/Time:  Thursday June 12 2014 17:09:16 EDT Ventricular Rate:  76 PR Interval:  146 QRS Duration: 100 QT Interval:  396 QTC Calculation: 445 R Axis:   78 Text Interpretation:  Sinus rhythm ST elevation suggests acute  pericarditis No significant change since last tracing Confirmed by  Maryan Rued  MD, WHITNEY ([id number]) on 06/12/2014 6:16:44 PM        Blanchie Dessert, MD 06/17/14 [phone]

## 2016-03-03 ENCOUNTER — Emergency Department (INDEPENDENT_AMBULATORY_CARE_PROVIDER_SITE_OTHER): Payer: No Typology Code available for payment source

## 2016-03-03 ENCOUNTER — Encounter (HOSPITAL_COMMUNITY): Payer: Self-pay | Admitting: Emergency Medicine

## 2016-03-03 ENCOUNTER — Emergency Department (HOSPITAL_COMMUNITY)
Admission: EM | Admit: 2016-03-03 | Discharge: 2016-03-03 | Disposition: A | Discharge status: Home / Self Care | Payer: No Typology Code available for payment source | Source: Home / Self Care | Attending: Family Medicine | Admitting: Family Medicine

## 2016-03-03 DIAGNOSIS — M79641 Pain in right hand: Secondary | ICD-10-CM

## 2016-03-03 DIAGNOSIS — N179 Acute kidney failure, unspecified: Secondary | ICD-10-CM

## 2016-03-03 LAB — POCT I-STAT, CHEM 8
BUN: 20 mg/dL (ref 6–20)
CREATININE: 1.1 mg/dL (ref 0.61–1.24)
Calcium, Ion: 1.16 mmol/L (ref 1.13–1.30)
Chloride: 104 mmol/L (ref 101–111)
Glucose, Bld: 123 mg/dL — ABNORMAL HIGH (ref 65–99)
HEMATOCRIT: 48 % (ref 39.0–52.0)
HEMOGLOBIN: 16.3 g/dL (ref 13.0–17.0)
Potassium: 3.7 mmol/L (ref 3.5–5.1)
Sodium: 142 mmol/L (ref 135–145)
TCO2: 27 mmol/L (ref 0–100)

## 2016-03-03 IMAGING — DX DG HAND COMPLETE 3+V*R*
3 series · 3 of 3 positions shown · non-contrast
Comparison: None.

CLINICAL DATA: Pain in the right hand. Weakness. Symptoms
especially in the index finger MCP joint region.

EXAM:
RIGHT HAND - COMPLETE 3+ VIEW

[hand pa]
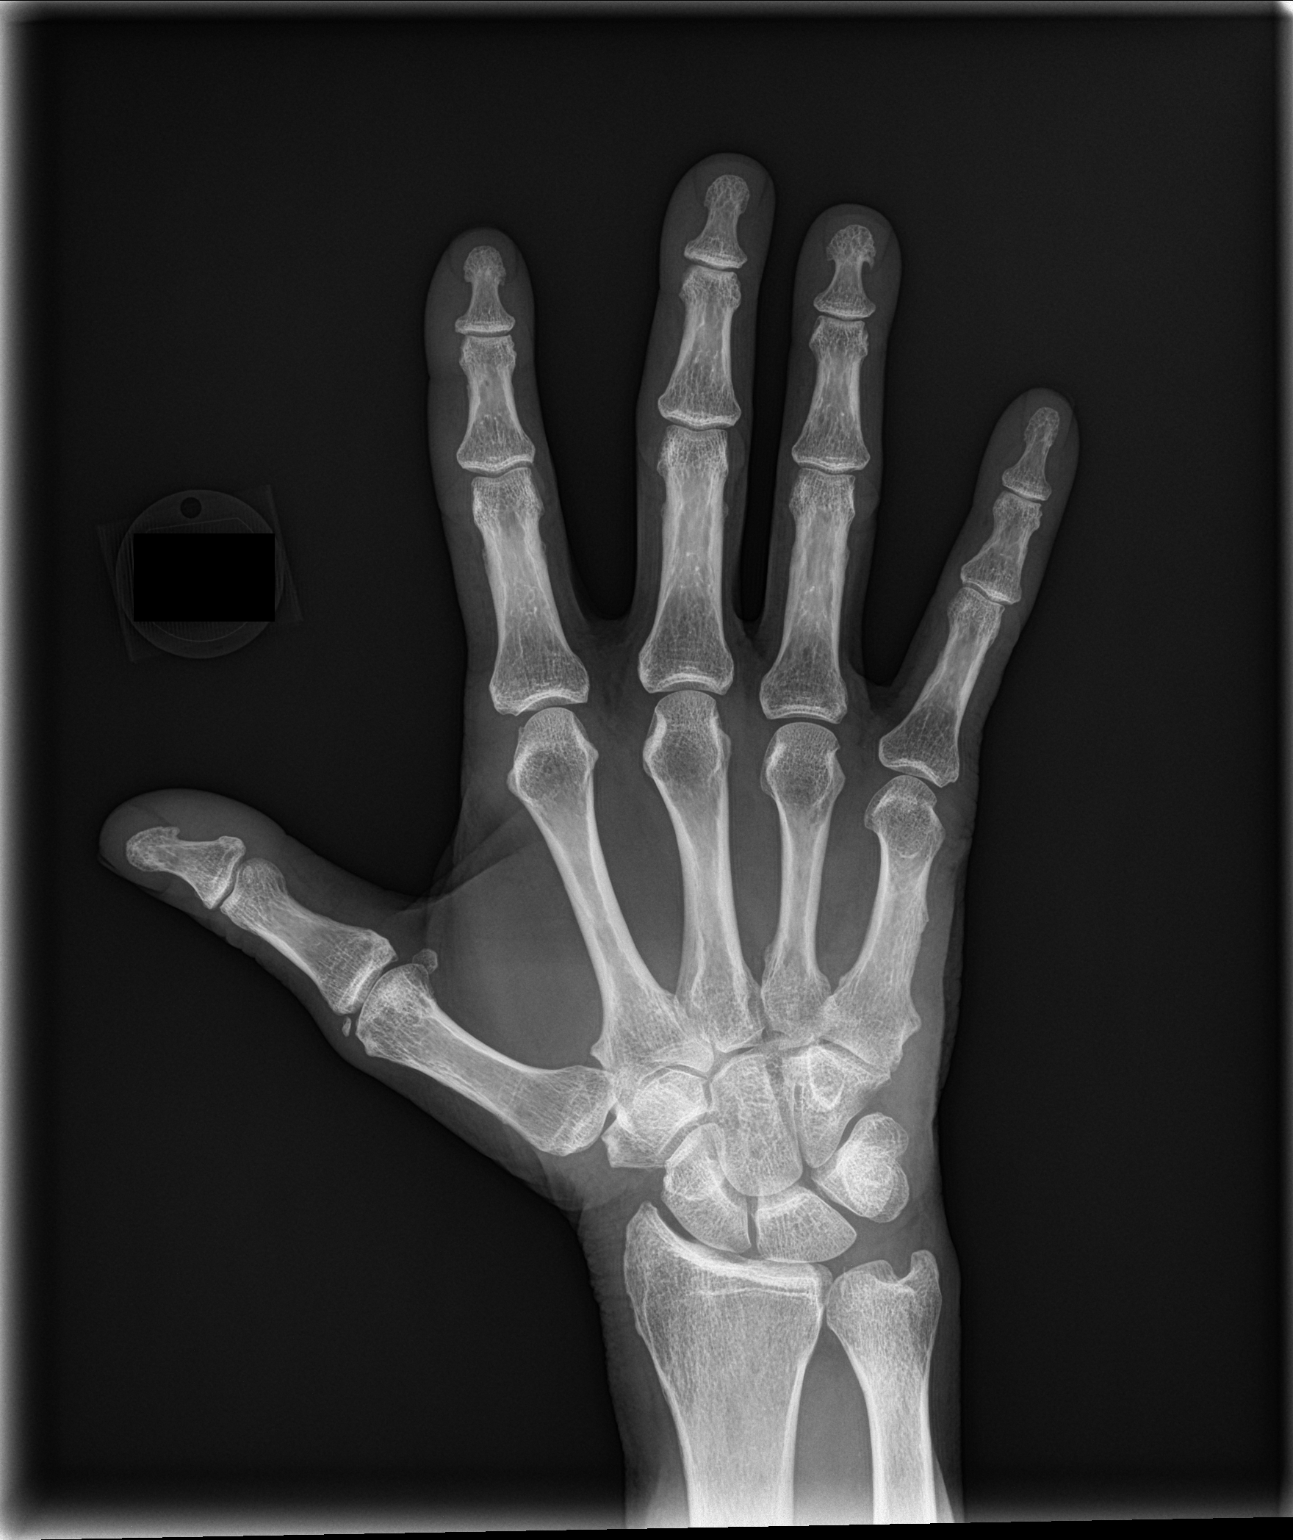

[hand obl]
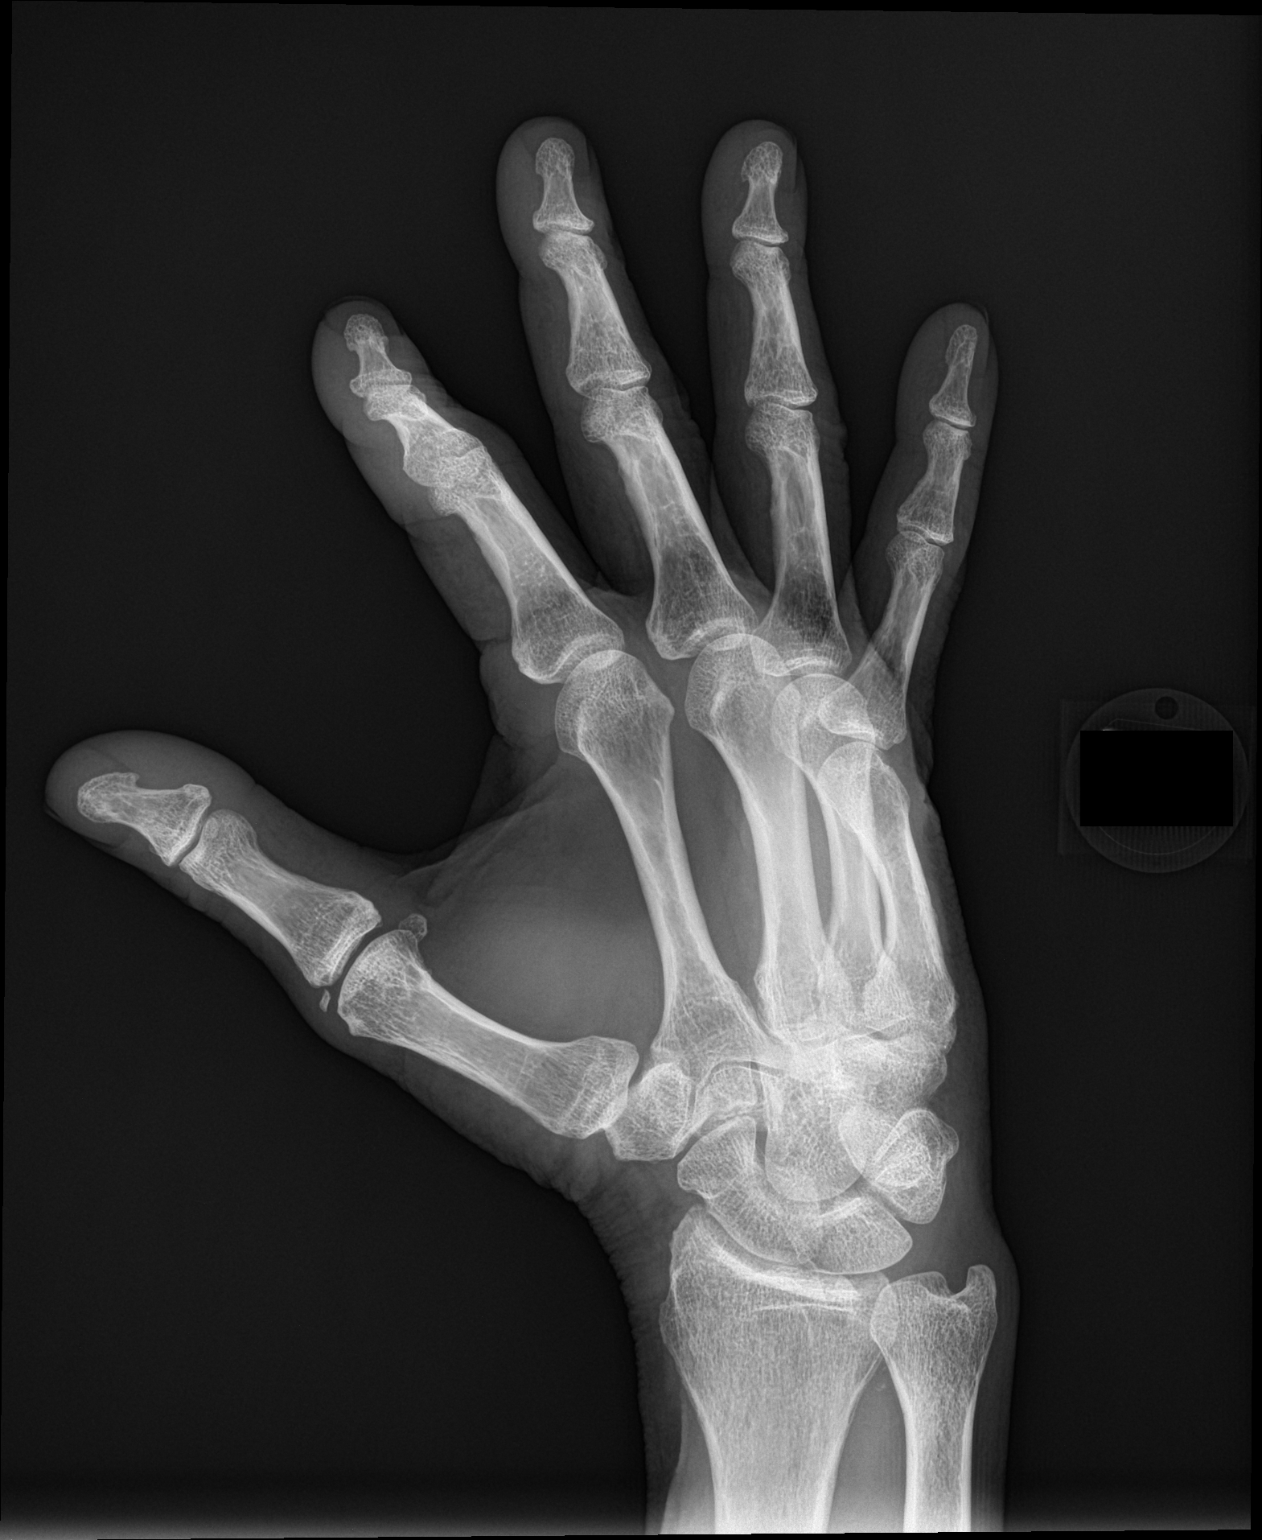

[hand lat]
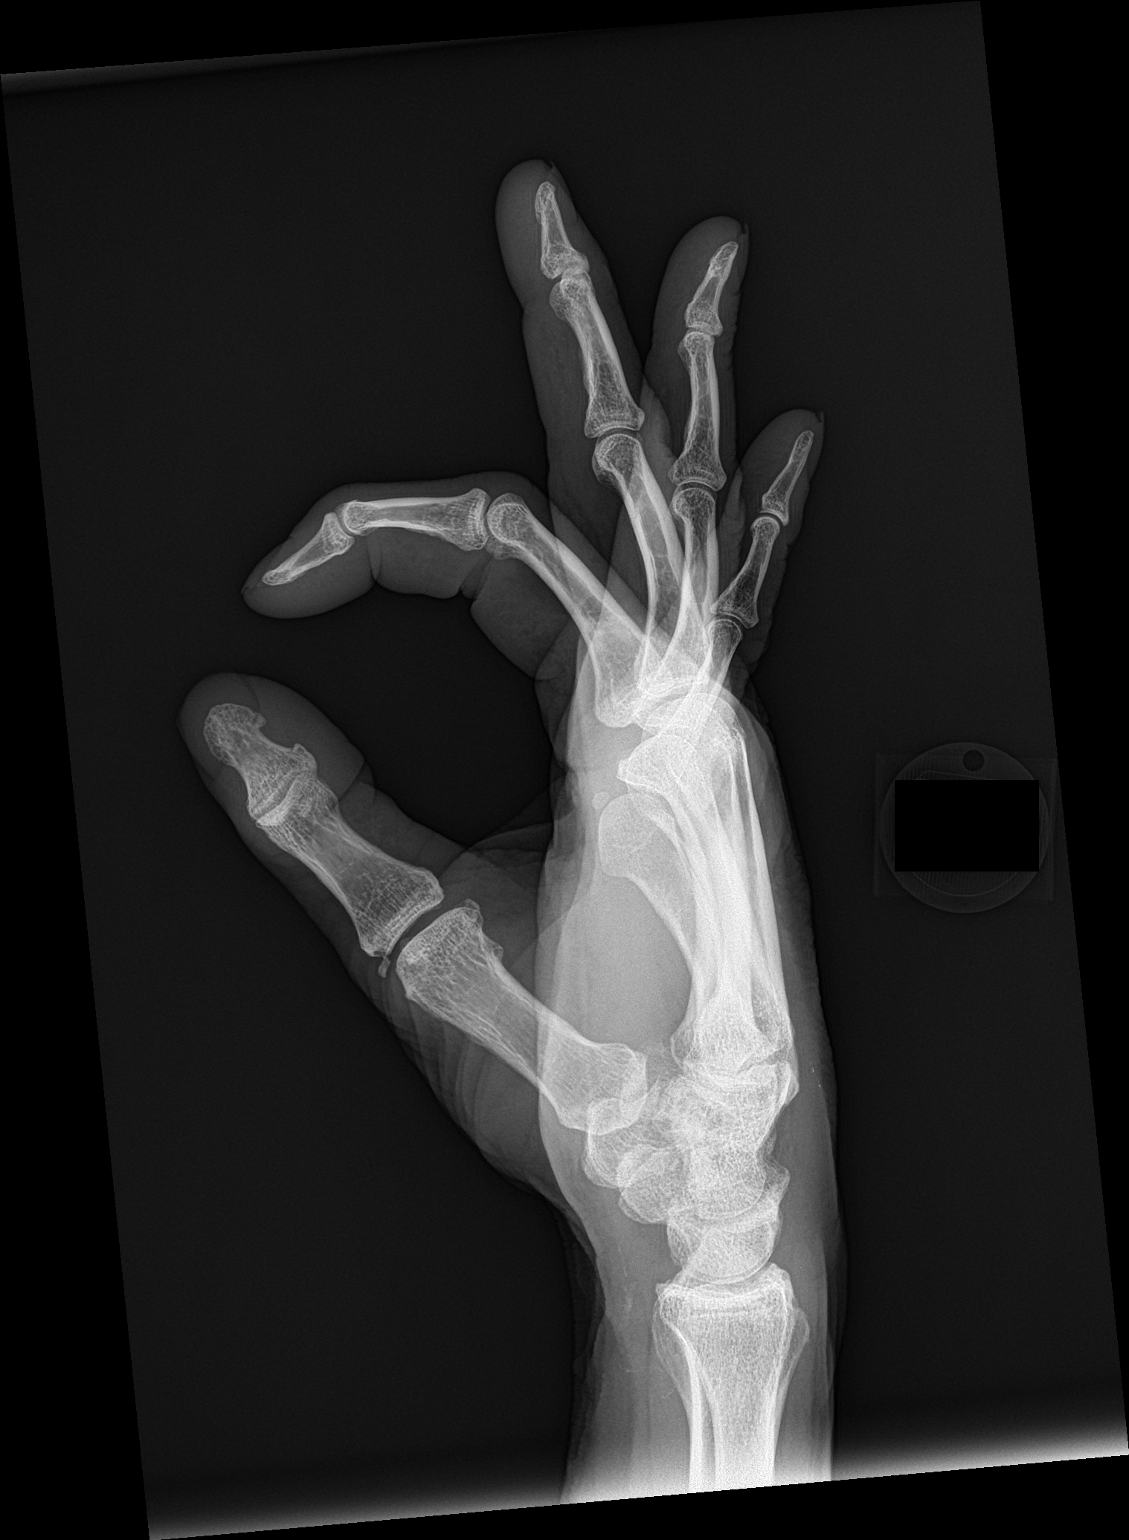

[3 of 3 positions shown; findings below may reference images not displayed]

FINDINGS: There is no evidence of acute fracture or dislocation. Questionable
old boxer' s fracture the fifth metacarpal, versus unusual spurring.

At the index finger MCP joint there may be some minimal spurring but
otherwise no significant arthropathy.
IMPRESSION: 1. No acute findings. There may be minimal spurring at the index
finger MCP joint but otherwise no significant arthropathy. If pain
persists despite conservative therapy, MRI may be warranted for
further characterization.

## 2016-03-03 MED ORDER — TRAMADOL HCL 50 MG PO TABS: 50.0000 mg | ORAL_TABLET | Freq: Two times a day (BID) | ORAL | Status: DC | PRN

## 2016-03-03 NOTE — ED Notes (Signed)
Pt injured the knuckle on his right second finger last year.  Since then he has had a lot of trouble and pain in the knuckle.  Recently that pain went away and has moved to the top of his hand.  He states when he is working the hand will swell, become very painful and turn red.

## 2016-03-03 NOTE — ED Provider Notes (Addendum)
CSN: [id number]     Arrival date & time 03/03/16  1353 History   First MD Initiated Contact with Patient 03/03/16 1605     Chief Complaint  Patient presents with  . Hand Pain   (Consider location/radiation/quality/duration/timing/severity/associated sxs/prior Treatment) Patient is a 60 y.o. male presenting with hand pain. The history is provided by the patient. No language interpreter was used.  Hand Pain   Patient presents with complaint of R hand pain, primarily focused on R index finger at MCP joint.  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  Has felt weaker to grasp wiwth R hand than usual.  No wrist pain. No focal trauma recently. Pain is not present when resting, gets worse the more he uses the R hand.   Of note, patient had labs in June 2015, had Cr 5.9 on admission and came down to 3.74 at discharge. AKI attributed to ACEI and NSAID use.   Patient does not have a doctor and no records of follow up labs since then.   Past Medical History  Diagnosis Date  . Hypertension   . Tobacco abuse    Past Surgical History  Procedure Laterality Date  . Tonsillectomy     Family History  Problem Relation Age of Onset  . Emphysema Father   . Stroke Mother   . CAD Brother 3    CABG  . CAD Father     CABG   Social History  Substance Use Topics  . Smoking status: Current Every Day Smoker -- 0.50 packs/day for 40 years    Types: Cigarettes  . Smokeless tobacco: None     Comment: Pt is using the vapor cigeratte  . Alcohol Use: Yes    Review of Systems  Constitutional: Negative for fever, chills and fatigue.  HENT: Negative for congestion.   All other systems reviewed and are negative.   Allergies  Review of patient's allergies indicates no known allergies.  Home Medications   Prior to Admission medications    Medication Sig Start Date End Date Taking? Authorizing Provider  aspirin 325 MG tablet Take 325 mg by mouth 2 (two) times a week.    Historical Provider, MD   Meds Ordered and Administered this Visit  Medications - No data to display  BP 163/93 mmHg  Pulse 84  Temp(Src) 98.4 F (36.9 C) (Oral)  Resp 18  SpO2 97% No data found.   Physical Exam  Constitutional: He appears well-developed and well-nourished. No distress.  HENT:  Head: Normocephalic and atraumatic.  Musculoskeletal:  No active synovitis in knuckles of R or L hand.  Full active strength and ROM; handgrip intact and symmetric.  Radial pulses symmetric.  Enlargement of R index MCP joint, without redness or focal tenderness Negative Tinnels signs bilaterally.  Full active ROM bilateral wrists. No point tenderness over anatomic snuff box.    Neurological:  Sensation in distal finger tips of both hands intact; brisk cap refill < 2 seconds bilaterally.   Skin: He is not diaphoretic.    ED Course  Procedures (including critical care time)  Labs Review Labs Reviewed - No data to display  Imaging Review No results found.   Visual Acuity Review  Right Eye Distance:   Left Eye Distance:   Bilateral Distance:    Right Eye Near:  Left Eye Near:    Bilateral Near:         MDM   1. AKI (acute kidney injury) ([place])   2. Right hand pain    R hand pain-- Suspect overuse; no active synovitis.  Distant history (9 months or more) of hammer injury to R index finger MCP joint. Recommend rest.  X-ray R hand reviewed by me: radiology read, "There may be minimal spurring at the index finger MCP joint but otherwise no significant arthropathy."  To recommend rest, may use Tramadol 50mg  po every 12 hours as needed. Avoid NSAIDs.  2. HIstory AKI, attributed to NSAIDs and dehydration. No follow up labs on record. To check iSTAT today, Cr 1.1 and K+ 3.7.  PATIENT ADVISED NOT TO TAKE NSAIDS.   Dalbert Mayotte,  MD    Willeen Niece, MD 03/03/16 [place], MD 03/03/16 [phone]

## 2016-03-03 NOTE — Discharge Instructions (Signed)
It is a pleasure to see you today.  The x-ray does not show any marked findings to explain your hand pain.   I suspect the pain is related to overuse.   I recommend attempting to rest the right hand as much as possible.  I am giving you a prescription for Tramadol 50mg  to be taken every 12 hours as needed for extreme pain> Not to take with alcohol; may cause drowsiness.   GIVEN YOUR PRIOR HISTORY OF KIDNEY ISSUES WITH ANTI-INFLAMMATORIES, I RECOMMEND NO ADVIL OR NAPROXEN OR OTHER NSAIDS.    I recommend you establish care with a primary care doctor.

## 2019-09-23 ENCOUNTER — Telehealth: Payer: Self-pay

## 2019-09-23 NOTE — Telephone Encounter (Signed)

## 2019-09-24 ENCOUNTER — Other Ambulatory Visit: Payer: Self-pay

## 2019-09-24 ENCOUNTER — Ambulatory Visit (INDEPENDENT_AMBULATORY_CARE_PROVIDER_SITE_OTHER): Payer: Medicare Other | Admitting: Internal Medicine

## 2019-09-24 VITALS — BP 166/101 | HR 81 | Temp 97.2°F | Resp 17 | Ht 70.0 in | Wt 161.2 lb

## 2019-09-24 DIAGNOSIS — F1010 Alcohol abuse, uncomplicated: Secondary | ICD-10-CM | POA: Insufficient documentation

## 2019-09-24 DIAGNOSIS — K5900 Constipation, unspecified: Secondary | ICD-10-CM | POA: Diagnosis not present

## 2019-09-24 DIAGNOSIS — I10 Essential (primary) hypertension: Secondary | ICD-10-CM

## 2019-09-24 DIAGNOSIS — F1090 Alcohol use, unspecified, uncomplicated: Secondary | ICD-10-CM | POA: Insufficient documentation

## 2019-09-24 DIAGNOSIS — Z23 Encounter for immunization: Secondary | ICD-10-CM | POA: Diagnosis not present

## 2019-09-24 DIAGNOSIS — Z114 Encounter for screening for human immunodeficiency virus [HIV]: Secondary | ICD-10-CM

## 2019-09-24 DIAGNOSIS — Z125 Encounter for screening for malignant neoplasm of prostate: Secondary | ICD-10-CM

## 2019-09-24 DIAGNOSIS — R768 Other specified abnormal immunological findings in serum: Secondary | ICD-10-CM | POA: Diagnosis not present

## 2019-09-24 DIAGNOSIS — Z1211 Encounter for screening for malignant neoplasm of colon: Secondary | ICD-10-CM

## 2019-09-24 DIAGNOSIS — R21 Rash and other nonspecific skin eruption: Secondary | ICD-10-CM

## 2019-09-24 DIAGNOSIS — F17200 Nicotine dependence, unspecified, uncomplicated: Secondary | ICD-10-CM | POA: Insufficient documentation

## 2019-09-24 DIAGNOSIS — Z2821 Immunization not carried out because of patient refusal: Secondary | ICD-10-CM

## 2019-09-24 DIAGNOSIS — F17210 Nicotine dependence, cigarettes, uncomplicated: Secondary | ICD-10-CM | POA: Diagnosis not present

## 2019-09-24 DIAGNOSIS — Z1159 Encounter for screening for other viral diseases: Secondary | ICD-10-CM

## 2019-09-24 MED ORDER — NICOTINE 21 MG/24HR TD PT24: 21.0000 mg | MEDICATED_PATCH | Freq: Every day | TRANSDERMAL | 0 refills | Status: DC

## 2019-09-24 MED ORDER — LISINOPRIL 5 MG PO TABS: 5.0000 mg | ORAL_TABLET | Freq: Every day | ORAL | 3 refills | Status: DC

## 2019-09-24 MED ORDER — TRIAMCINOLONE ACETONIDE 0.1 % EX CREA: 1.0000 "application " | TOPICAL_CREAM | Freq: Two times a day (BID) | CUTANEOUS | 0 refills | Status: DC

## 2019-09-24 MED ORDER — AMLODIPINE BESYLATE 5 MG PO TABS: 5.0000 mg | ORAL_TABLET | Freq: Every day | ORAL | 3 refills | Status: DC

## 2019-09-24 NOTE — Patient Instructions (Addendum)
Start amlodipine 5 mg and lisinopril 5 mg daily for blood pressure.  Continue to check your blood pressure at least twice a week.  Goal is 130/80 or lower.  Use the triamcinolone cream for the rash on your back.  Check and make sure you do not have bugs on your couch.  Start using the nicotine patch the 21 mg daily to help quit smoking.  He will get the Cologuard kit in the mail for colon cancer screening.

## 2019-09-24 NOTE — Progress Notes (Signed)
Patient here to get established.  Patient is fasting today.  Doesn't get flu shots.  Was on BP medications in the past.

## 2019-09-24 NOTE — Progress Notes (Signed)
Patient ID: Ronald Lowe, male    DOB: 1956-08-31  MRN: [id number]  CC: Establish Care   Subjective: Ronald Lowe is a 63 y.o. male who presents for new pt visit at [facility] His concerns today include:   Last PCP was at [facility].  Last seen 10 yrs ago.  Hx of HTN.  Last took med 5 yrs ago Has wrist cuff.  Checks BP occasionally; last check this wkend.  Reports reading was high Denies CP/SOB/LE edema/HA dizziness. Tries to limit salt in foods  Tob dep: was 1 pk a day smoker since age 41. Now 1/3 pk a day.  Had cut back "almost to a crawl" but never quit. No chronic cough Drinks 4 beers and 2 mix drinks/day.  He has been doing this for quite some time.  HM:  Due for colon CA screen, Prevnar 13, Tdap and flu shot.  Patient Active Problem List   Diagnosis Date Noted  . Orthostatic hypotension 06/12/2014  . EKG abnormality 06/12/2014  . HTN (hypertension) 06/12/2014     No current outpatient medications on file prior to visit.   No current facility-administered medications on file prior to visit.     No Known Allergies  Social History   Socioeconomic History  . Marital status: Married    Spouse name: Not on file  . Number of children: 2  . Years of education: Not on file  . Highest education level: Not on file  Occupational History  . Occupation: retired  Scientific laboratory technician  . Financial resource strain: Not on file  . Food insecurity    Worry: Not on file    Inability: Not on file  . Transportation needs    Medical: Not on file    Non-medical: Not on file  Tobacco Use  . Smoking status: Current Every Day Smoker    Packs/day: 0.50    Years: 40.00    Pack years: 20.00    Types: Cigarettes  . Smokeless tobacco: Never Used  Substance and Sexual Activity  . Alcohol use: Yes    Alcohol/week: 42.0 standard drinks    Types: 28 Cans of beer, 14 Shots of liquor per week  . Drug use: No  . Sexual activity: Yes    Partners: Female    Birth  control/protection: None  Lifestyle  . Physical activity    Days per week: Not on file    Minutes per session: Not on file  . Stress: Not on file  Relationships  . Social Herbalist on phone: Not on file    Gets together: Not on file    Attends religious service: Not on file    Active member of club or organization: Not on file    Attends meetings of clubs or organizations: Not on file    Relationship status: Not on file  . Intimate partner violence    Fear of current or ex partner: Not on file    Emotionally abused: Not on file    Physically abused: Not on file    Forced sexual activity: Not on file  Other Topics Concern  . Not on file  Social History Narrative   Lives with girlfriend.      Family History  Problem Relation Age of Onset  . Emphysema Father   . CAD Father        CABG  . Stroke Mother   . CAD Brother 52  CABG  . Hypertension Brother     Past Surgical History:  Procedure Laterality Date  . TONSILLECTOMY      ROS: Review of Systems  Constitutional: Negative for activity change and appetite change.  Cardiovascular: Negative for palpitations and leg swelling.  Gastrointestinal:       Stools have been hard intermittently.  No blood in stools. Loss 15 lbs over last 5 yrs. reports good appetite  Skin:       Rash on posterior thorax x 2 wks. very itchy.  No initiating factors.  He lives with his wife.  She has not had a rash.  There is been no recent change in body products.  He normally sleeps on the couch.  No bugs on the house.    PHYSICAL EXAM: BP (!) 166/101   Pulse 81   Temp (!) 97.2 F (36.2 C) (Temporal)   Resp 17   Ht 5\' 10"  (1.778 m)   Wt 161 lb 3.2 oz (73.1 kg)   SpO2 96%   BMI 23.13 kg/m   Physical Exam  General appearance - alert, well appearing, older Caucasian male and in no distress Mental status - normal mood, behavior, speech, dress, motor activity, and thought processes Eyes - pupils equal and reactive,  extraocular eye movements intact Nose - normal and patent, no erythema, discharge or polyps Mouth - mucous membranes moist, pharynx normal without lesions Neck -no thyroid enlargement.  No neck masses. Lymphatics -no cervical axillary lymphadenopathy Chest - clear to auscultation, no wheezes, rales or rhonchi, symmetric air entry Heart - normal rate, regular rhythm, normal S1, S2, no murmurs, rubs, clicks or gallops Extremities -no lower extremity edema.  Radial and brachial pulses are 3+ bilaterally. Skin: Scattered macular rash over the posterior thorax.  Looks like petit borrows into the skin.  No rash noted on the scalp, anterior thorax, between the webs or in the abdomen. CMP Latest Ref Rng & Units 03/03/2016 06/13/2014 06/12/2014  Glucose 65 - 99 mg/dL 123(H) 94 91  BUN 6 - 20 mg/dL 20 45(H) 43(H)  Creatinine 0.61 - 1.24 mg/dL 1.10 3.74(H) 4.98(H)  Sodium 135 - 145 mmol/L 142 136(L) 134(L)  Potassium 3.5 - 5.1 mmol/L 3.7 4.0 4.8  Chloride 101 - 111 mmol/L 104 102 95(L)  CO2 19 - 32 mEq/L - 15(L) 20  Calcium 8.4 - 10.5 mg/dL - 7.9(L) 9.4  Total Protein 6.0 - 8.3 g/dL - - 7.9  Total Bilirubin 0.3 - 1.2 mg/dL - - 0.4  Alkaline Phos 39 - 117 U/L - - 60  AST 0 - 37 U/L - - 24  ALT 0 - 53 U/L - - 14   Lipid Panel     Component Value Date/Time   CHOL 143 03/22/2010 1954   TRIG 61 03/22/2010 1954   HDL 48 03/22/2010 1954   CHOLHDL 3.0 Ratio 03/22/2010 1954   VLDL 12 03/22/2010 1954   LDLCALC 83 03/22/2010 1954    CBC    Component Value Date/Time   WBC 5.4 06/13/2014 0245   RBC 3.69 (L) 06/13/2014 0245   HGB 16.3 03/03/2016 1635   HCT 48.0 03/03/2016 1635   PLT 203 06/13/2014 0245   MCV 95.1 06/13/2014 0245   MCH 32.5 06/13/2014 0245   MCHC 34.2 06/13/2014 0245   RDW 13.1 06/13/2014 0245   LYMPHSABS 1.3 06/12/2014 1719   MONOABS 1.1 (H) 06/12/2014 1719   EOSABS 0.1 06/12/2014 1719   BASOSABS 0.0 06/12/2014 1719    ASSESSMENT AND PLAN:  1. Essential hypertension DASH  diet discussed and encouraged.  We will start him on low-dose amlodipine and lisinopril.  I chose not to use HCTZ given daily EtOH use - CBC - Lipid panel - Comprehensive metabolic panel - amLODipine (NORVASC) 5 MG tablet; Take 1 tablet (5 mg total) by mouth daily.  Dispense: 30 tablet; Refill: 3 - lisinopril (ZESTRIL) 5 MG tablet; Take 1 tablet (5 mg total) by mouth daily.  Dispense: 30 tablet; Refill: 3  2. Tobacco dependence Patient advised to quit smoking. Discussed health risks associated with smoking including lung and other types of cancers, chronic lung diseases and CV risks.. Pt ready to give trail of quitting.  Discussed methods to help quit including quitting cold [place], use of NRT, Chantix and Bupropion.  He is wanting to try the nicotine patches.  We will do the stepdown approach. I also discussed lung cancer screening with the patient.  He does not want to do low-dose CT scan at this time.  5 minutes spent on smoking cessation counseling.  - nicotine (NICODERM CQ - DOSED IN MG/24 HOURS) 21 mg/24hr patch; Place 1 patch (21 mg total) onto the skin daily.  Dispense: 28 patch; Refill: 0  3. Rash and nonspecific skin eruption Advised patient to check his couch and make sure that he does not have bedbugs as the rash looks like it may be due to insect bite - triamcinolone cream (KENALOG) 0.1 %; Apply 1 application topically 2 (two) times daily.  Dispense: 60 g; Refill: 0  4. Constipation, unspecified constipation type Encourage drinking several glasses of water a day and eating foods high in fiber especially stocking green leafy vegetables.  Advised to purchase MiraLAX over-the-counter and use as needed  5. Influenza vaccination declined   6. Need for Tdap vaccination - Tdap vaccine greater than or equal to 7yo IM  7. Need for vaccination against Streptococcus pneumoniae using pneumococcal conjugate vaccine 13 - Pneumococcal conjugate vaccine 13-valent  8. Alcohol use disorder,  mild, abuse Discussed how much is too much.  Discussed health issues that can develop due to excessive alcohol use.  Encouraged him to cut back to no more than 2 standard drinks a day.  9. Screening for HIV (human immunodeficiency virus) - HIV antibody (with reflex)  10. Need for hepatitis C screening test - Hepatitis C Antibody  11. Screening for colon cancer Discussed colon cancer screening methods.  Patient prefer to do the Cologuard test - Cologuard; Future  12. Prostate cancer screening His insurance does not pay for PSA   Patient was given the opportunity to ask questions.  Patient verbalized understanding of the plan and was able to repeat key elements of the plan.   Orders Placed This Encounter  Procedures  . Pneumococcal conjugate vaccine 13-valent  . Tdap vaccine greater than or equal to 7yo IM  . HIV antibody (with reflex)  . Hepatitis C Antibody  . CBC  . Lipid panel  . Comprehensive metabolic panel  . Cologuard     Requested Prescriptions   Signed Prescriptions Disp Refills  . nicotine (NICODERM CQ - DOSED IN MG/24 HOURS) 21 mg/24hr patch 28 patch 0    Sig: Place 1 patch (21 mg total) onto the skin daily.  Marland Kitchen amLODipine (NORVASC) 5 MG tablet 30 tablet 3    Sig: Take 1 tablet (5 mg total) by mouth daily.  Marland Kitchen lisinopril (ZESTRIL) 5 MG tablet 30 tablet 3    Sig: Take 1 tablet (5 mg total) by  mouth daily.  Marland Kitchen triamcinolone cream (KENALOG) 0.1 % 60 g 0    Sig: Apply 1 application topically 2 (two) times daily.    Return in about 4 weeks (around 10/22/2019) for BP recheck.  Karle Plumber, MD, FACP

## 2019-09-25 LAB — LIPID PANEL
Chol/HDL Ratio: 2.7 ratio (ref 0.0–5.0)
Cholesterol, Total: 182 mg/dL (ref 100–199)
HDL: 68 mg/dL (ref >39)
LDL Chol Calc (NIH): 99 mg/dL (ref 0–99)
Triglycerides: 84 mg/dL (ref 0–149)
VLDL Cholesterol Cal: 15 mg/dL (ref 5–40)

## 2019-09-25 LAB — HEPATITIS C ANTIBODY: Hep C Virus Ab: 9.2 s/co ratio — ABNORMAL HIGH (ref 0.0–0.9)

## 2019-09-25 LAB — CBC
Hematocrit: 48.4 % (ref 37.5–51.0)
Hemoglobin: 17 g/dL (ref 13.0–17.7)
MCH: 35.3 pg — ABNORMAL HIGH (ref 26.6–33.0)
MCHC: 35.1 g/dL (ref 31.5–35.7)
MCV: 101 fL — ABNORMAL HIGH (ref 79–97)
Platelets: 274 10*3/uL (ref 150–450)
RBC: 4.81 x10E6/uL (ref 4.14–5.80)
RDW: 12.8 % (ref 11.6–15.4)
WBC: 5.4 10*3/uL (ref 3.4–10.8)

## 2019-09-25 LAB — COMPREHENSIVE METABOLIC PANEL
ALT: 21 IU/L (ref 0–44)
AST: 33 IU/L (ref 0–40)
Albumin/Globulin Ratio: 1.8 (ref 1.2–2.2)
Albumin: 4.9 g/dL — ABNORMAL HIGH (ref 3.8–4.8)
Alkaline Phosphatase: 73 IU/L (ref 39–117)
BUN/Creatinine Ratio: 9 — ABNORMAL LOW (ref 10–24)
BUN: 9 mg/dL (ref 8–27)
Bilirubin Total: 1 mg/dL (ref 0.0–1.2)
CO2: 19 mmol/L — ABNORMAL LOW (ref 20–29)
Calcium: 9.8 mg/dL (ref 8.6–10.2)
Chloride: 100 mmol/L (ref 96–106)
Creatinine, Ser: 1.01 mg/dL (ref 0.76–1.27)
GFR calc Af Amer: 90 mL/min/{1.73_m2} (ref >59)
GFR calc non Af Amer: 78 mL/min/{1.73_m2} (ref >59)
Globulin, Total: 2.7 g/dL (ref 1.5–4.5)
Glucose: 89 mg/dL (ref 65–99)
Potassium: 4.4 mmol/L (ref 3.5–5.2)
Sodium: 142 mmol/L (ref 134–144)
Total Protein: 7.6 g/dL (ref 6.0–8.5)

## 2019-09-25 LAB — HIV ANTIBODY (ROUTINE TESTING W REFLEX): HIV Screen 4th Generation wRfx: NONREACTIVE

## 2019-09-25 NOTE — Addendum Note (Signed)
Addended by: Karle Plumber B on: 09/25/2019 11:00 AM   Modules accepted: Orders

## 2019-09-27 NOTE — Progress Notes (Signed)
Patient notified of results & recommendations. Expressed understanding. Will call back to make a lab appointment.

## 2019-09-30 ENCOUNTER — Other Ambulatory Visit: Payer: Self-pay

## 2019-09-30 ENCOUNTER — Other Ambulatory Visit: Payer: Medicare Other

## 2019-09-30 DIAGNOSIS — R768 Other specified abnormal immunological findings in serum: Secondary | ICD-10-CM

## 2019-09-30 NOTE — Progress Notes (Signed)
Patient here for additional labs.

## 2019-10-01 LAB — HCV RNA QUANT: Hepatitis C Quantitation: NOT DETECTED IU/mL

## 2019-10-02 ENCOUNTER — Encounter: Payer: Self-pay | Admitting: Internal Medicine

## 2019-10-02 DIAGNOSIS — R768 Other specified abnormal immunological findings in serum: Secondary | ICD-10-CM | POA: Insufficient documentation

## 2019-10-03 NOTE — Progress Notes (Signed)
Patient notified of results & recommendations. Expressed understanding.

## 2019-10-19 LAB — COLOGUARD: Cologuard: NEGATIVE

## 2019-10-21 ENCOUNTER — Telehealth: Payer: Self-pay

## 2019-10-21 NOTE — Telephone Encounter (Signed)
Cologuard results received via fax. Updated health maintenance.  Results were negative.

## 2019-10-21 NOTE — Telephone Encounter (Signed)

## 2019-10-21 NOTE — Addendum Note (Signed)
Addended by: Carylon Perches on: 10/21/2019 05:25 PM   Modules accepted: Orders

## 2019-10-22 ENCOUNTER — Ambulatory Visit (INDEPENDENT_AMBULATORY_CARE_PROVIDER_SITE_OTHER): Payer: Medicare Other | Admitting: Internal Medicine

## 2019-10-22 ENCOUNTER — Other Ambulatory Visit: Payer: Self-pay

## 2019-10-22 VITALS — BP 129/87 | HR 80 | Temp 97.5°F | Resp 17 | Ht 71.5 in | Wt 163.4 lb

## 2019-10-22 DIAGNOSIS — F17210 Nicotine dependence, cigarettes, uncomplicated: Secondary | ICD-10-CM | POA: Diagnosis not present

## 2019-10-22 DIAGNOSIS — F1010 Alcohol abuse, uncomplicated: Secondary | ICD-10-CM

## 2019-10-22 DIAGNOSIS — I10 Essential (primary) hypertension: Secondary | ICD-10-CM

## 2019-10-22 DIAGNOSIS — F17200 Nicotine dependence, unspecified, uncomplicated: Secondary | ICD-10-CM

## 2019-10-22 DIAGNOSIS — J069 Acute upper respiratory infection, unspecified: Secondary | ICD-10-CM

## 2019-10-22 MED ORDER — LISINOPRIL 5 MG PO TABS: 2.5000 mg | ORAL_TABLET | Freq: Every day | ORAL | 3 refills | Status: DC

## 2019-10-22 NOTE — Progress Notes (Signed)
Patient ID: Ronald Lowe, male    DOB: August 31, 1956  MRN: [id number]  CC: Hypertension   Subjective: Ronald Lowe is a 63 y.o. male who presents for chronic ds management His concerns today include:  Pt with hx of HTN, pos Hep C ab, ETOH use disorder, tob dep  HYPERTENSION Currently taking: see medication list Med Adherence: .  He was started on Lisinopril and Norvasc on last visit.  started taking meds QOD about 2 weeks ago because he was having dizziness when he bends over then go to stand up.  Medication side effects: [x]  Yes    []  No Adherence with salt restriction: [x]  Yes    []  No Home Monitoring?: [x]  Yes  -using a wrist cuff but does not write down numbers  Monitoring Frequency:  2 x a wk Home BP results range: []  Yes    []  No SOB? []  Yes    [x]  No Chest Pain?: []  Yes    [x]  No Leg swelling?: []  Yes    [x]  No Headaches?: []  Yes    [x]  No Dizziness? [x]  Yes - see above    []  No Comments:   Tob dep:  Did not get nicotine patches because of cost.    ETOH use disorder:  Reports he sometimes has 2 mix drinks OR 4 beers a day but not doing both the same day anymore since we spoke on last visit.   He reports having a little bit of runny nose this morning.  No cough or fever.  Denies any shortness of breath no sore throat.  States he plans to buy an over-the-counter medication to help with the "sniffles."  I went over labs from his last visit.  His hepatitis C antibody was positive but confirmatory RNA test negative.  We did not check a PSA because his insurance would not pay for it.  Patient was made aware of this.  I offered today whether he would like to have it done with the understanding that he would be billed for it.  Patient declined. Patient Active Problem List   Diagnosis Date Noted  . Positive hepatitis C antibody test 10/02/2019  . Tobacco dependence 09/24/2019  . Alcohol use disorder, mild, abuse 09/24/2019  . Orthostatic hypotension 06/12/2014  . EKG  abnormality 06/12/2014  . HTN (hypertension) 06/12/2014     Current Outpatient Medications on File Prior to Visit  Medication Sig Dispense Refill  . amLODipine (NORVASC) 5 MG tablet Take 1 tablet (5 mg total) by mouth daily. (Patient taking differently: Take 5 mg by mouth every other day. ) 30 tablet 3  . nicotine (NICODERM CQ - DOSED IN MG/24 HOURS) 21 mg/24hr patch Place 1 patch (21 mg total) onto the skin daily. 28 patch 0  . triamcinolone cream (KENALOG) 0.1 % Apply 1 application topically 2 (two) times daily. 60 g 0   No current facility-administered medications on file prior to visit.     No Known Allergies  Social History   Socioeconomic History  . Marital status: Married    Spouse name: Not on file  . Number of children: 2  . Years of education: Not on file  . Highest education level: Not on file  Occupational History  . Occupation: retired  Scientific laboratory technician  . Financial resource strain: Not on file  . Food insecurity    Worry: Not on file    Inability: Not on file  . Transportation needs    Medical: Not on file  Non-medical: Not on file  Tobacco Use  . Smoking status: Current Every Day Smoker    Packs/day: 0.50    Years: 40.00    Pack years: 20.00    Types: Cigarettes  . Smokeless tobacco: Never Used  Substance and Sexual Activity  . Alcohol use: Yes    Alcohol/week: 42.0 standard drinks    Types: 28 Cans of beer, 14 Shots of liquor per week  . Drug use: No  . Sexual activity: Yes    Partners: Female    Birth control/protection: None  Lifestyle  . Physical activity    Days per week: Not on file    Minutes per session: Not on file  . Stress: Not on file  Relationships  . Social Herbalist on phone: Not on file    Gets together: Not on file    Attends religious service: Not on file    Active member of club or organization: Not on file    Attends meetings of clubs or organizations: Not on file    Relationship status: Not on file  . Intimate  partner violence    Fear of current or ex partner: Not on file    Emotionally abused: Not on file    Physically abused: Not on file    Forced sexual activity: Not on file  Other Topics Concern  . Not on file  Social History Narrative   Lives with girlfriend.      Family History  Problem Relation Age of Onset  . Emphysema Father   . CAD Father        CABG  . Stroke Mother   . CAD Brother 28       CABG  . Hypertension Brother     Past Surgical History:  Procedure Laterality Date  . TONSILLECTOMY      ROS: Review of Systems Negative except as stated above  PHYSICAL EXAM: BP 129/87   Pulse 80   Temp (!) 97.5 F (36.4 C) (Temporal)   Resp 17   Ht 5' 11.5" (1.816 m)   Wt 163 lb 6.4 oz (74.1 kg)   SpO2 94%   BMI 22.47 kg/m   Repeat BP sitting 124/87, standing 124/86 Physical Exam General appearance - alert, well appearing, and in no distress Mental status - normal mood, behavior, speech, dress, motor activity, and thought processes Nose -clear mucus in the nostrils.  No enlargement of nasal turbinates. Mouth - mucous membranes moist, pharynx normal without lesions Chest - clear to auscultation, no wheezes, rales or rhonchi, symmetric air entry Heart - normal rate, regular rhythm, normal S1, S2, no murmurs, rubs, clicks or gallops Extremities - peripheral pulses normal, no pedal edema, no clubbing or cyanosis  CMP Latest Ref Rng & Units 09/24/2019 03/03/2016 06/13/2014  Glucose 65 - 99 mg/dL 89 123(H) 94  BUN 8 - 27 mg/dL 9 20 45(H)  Creatinine 0.76 - 1.27 mg/dL 1.01 1.10 3.74(H)  Sodium 134 - 144 mmol/L 142 142 136(L)  Potassium 3.5 - 5.2 mmol/L 4.4 3.7 4.0  Chloride 96 - 106 mmol/L 100 104 102  CO2 20 - 29 mmol/L 19(L) - 15(L)  Calcium 8.6 - 10.2 mg/dL 9.8 - 7.9(L)  Total Protein 6.0 - 8.5 g/dL 7.6 - -  Total Bilirubin 0.0 - 1.2 mg/dL 1.0 - -  Alkaline Phos 39 - 117 IU/L 73 - -  AST 0 - 40 IU/L 33 - -  ALT 0 - 44 IU/L 21 - -  Lipid Panel     Component  Value Date/Time   CHOL 182 09/24/2019 1144   TRIG 84 09/24/2019 1144   HDL 68 09/24/2019 1144   CHOLHDL 2.7 09/24/2019 1144   CHOLHDL 3.0 Ratio 03/22/2010 1954   VLDL 12 03/22/2010 1954   LDLCALC 99 09/24/2019 1144    CBC    Component Value Date/Time   WBC 5.4 09/24/2019 1144   WBC 5.4 06/13/2014 0245   RBC 4.81 09/24/2019 1144   RBC 3.69 (L) 06/13/2014 0245   HGB 17.0 09/24/2019 1144   HCT 48.4 09/24/2019 1144   PLT 274 09/24/2019 1144   MCV 101 (H) 09/24/2019 1144   MCH 35.3 (H) 09/24/2019 1144   MCH 32.5 06/13/2014 0245   MCHC 35.1 09/24/2019 1144   MCHC 34.2 06/13/2014 0245   RDW 12.8 09/24/2019 1144   LYMPHSABS 1.3 06/12/2014 1719   MONOABS 1.1 (H) 06/12/2014 1719   EOSABS 0.1 06/12/2014 1719   BASOSABS 0.0 06/12/2014 1719    ASSESSMENT AND PLAN:  1. Essential hypertension Diastolic blood pressure not at goal.  He has not taken blood pressure medicines today. Advised that patient go slow with position changes. Recommend that he continue Norvasc as 5 mg but decrease lisinopril to 2.5 mg daily.  Advised to check blood pressure at least twice a week and write down the numbers.  Goal is 130/80 or lower. - lisinopril (ZESTRIL) 5 MG tablet; Take 0.5 tablets (2.5 mg total) by mouth daily.  Dispense: 30 tablet; Refill: 3  2. Tobacco dependence Advised to quit.  He is unable to afford the nicotine patches.  I gave him the information for 1 800 quit NOW to call and request the nicotine patches for free.  3. Alcohol use disorder, mild, abuse Commended him on cutting back some.  Encouraged him to cut back more.  4. Acute upper respiratory infection Recommend over-the-counter Coricidin HBP.    Patient was given the opportunity to ask questions.  Patient verbalized understanding of the plan and was able to repeat key elements of the plan.   No orders of the defined types were placed in this encounter.    Requested Prescriptions   Signed Prescriptions Disp Refills  .  lisinopril (ZESTRIL) 5 MG tablet 30 tablet 3    Sig: Take 0.5 tablets (2.5 mg total) by mouth daily.    Return in about 3 months (around 01/22/2020).  Karle Plumber, MD, FACP

## 2019-10-22 NOTE — Progress Notes (Signed)
Patient notified of results & recommendations. Expressed understanding.

## 2019-10-22 NOTE — Progress Notes (Signed)
Here for BP follow up. Does check BP at home, states that readings have been getting better. Denies chest pain, SHOB, headaches, palpitations, lower extremity swelling. Has some c/o dizziness. Has started taking BP medication every other day.  Informed of negative Cologuard results. Aware of repeat in 3 years.

## 2019-10-22 NOTE — Patient Instructions (Signed)
Call 1 800 quit now to request the nicotine patches for free.  Go slow with position changes.  Continue taking amlodipine 5 mg daily. Decrease the lisinopril to 1/2 tablet daily. Continue to check your blood pressure at least twice a week.  The goal is 130/80 or lower.

## 2020-01-20 ENCOUNTER — Telehealth: Payer: Self-pay | Admitting: Internal Medicine

## 2020-01-20 NOTE — Telephone Encounter (Signed)
Called and confirmed patient phone visit for tomorrow, 01/21/2020 @ 9:10.

## 2020-01-21 ENCOUNTER — Encounter: Payer: Self-pay | Admitting: Internal Medicine

## 2020-01-21 ENCOUNTER — Ambulatory Visit (INDEPENDENT_AMBULATORY_CARE_PROVIDER_SITE_OTHER): Payer: Medicare Other | Admitting: Internal Medicine

## 2020-01-21 DIAGNOSIS — F17200 Nicotine dependence, unspecified, uncomplicated: Secondary | ICD-10-CM

## 2020-01-21 DIAGNOSIS — I10 Essential (primary) hypertension: Secondary | ICD-10-CM | POA: Diagnosis not present

## 2020-01-21 MED ORDER — LISINOPRIL 5 MG PO TABS: 2.5000 mg | ORAL_TABLET | ORAL | 3 refills | Status: DC

## 2020-01-21 NOTE — Progress Notes (Signed)
Virtual Visit via Telephone Note  I connected with Ronald Lowe, on 01/21/2020 at 9:11 AM by telephone due to the COVID-19 pandemic and verified that I am speaking with the correct person using two identifiers.   Consent: I discussed the limitations, risks, security and privacy concerns of performing an evaluation and management service by telephone and the availability of in person appointments. I also discussed with the patient that there may be a patient responsible charge related to this service. The patient expressed understanding and agreed to proceed.   Location of Patient: Home   Location of Provider: Clinic    Persons participating in Telemedicine visit: Ronald Lowe Dr. Juleen China      History of Present Illness: Patient has follow up for HTN. Medication changed recently due to some lightheadedness Lisinopril 2.5 every other day and Amlodipine 5 mg every other day. No LOC or syncope. No lightheadedness or dizziness. Denies chest pain, SOB, headaches, vision changes. Was previously keeping a blood pressure log but doesn't have the correct batteries for his meter and its dead. His wife took it about two weeks ago and said it was in range but he doesn't recall the number.   Current smoker. Has cut back to 1/2 ppd from 1 ppd. Makes an effort to not smoke until after lunch and that helps a lot. Has gotten into the routine of it, been consistent with this for 6 months. Has called the 800 Quit Now and hasn't been able to get through 3x.    Past Medical History:  Diagnosis Date  . Hypertension   . Tobacco abuse    No Known Allergies  Current Outpatient Medications on File Prior to Visit  Medication Sig Dispense Refill  . amLODipine (NORVASC) 5 MG tablet Take 1 tablet (5 mg total) by mouth daily. (Patient taking differently: Take 5 mg by mouth every other day. ) 30 tablet 3  . lisinopril (ZESTRIL) 5 MG tablet Take 0.5 tablets (2.5 mg total) by mouth daily. 30  tablet 3  . nicotine (NICODERM CQ - DOSED IN MG/24 HOURS) 21 mg/24hr patch Place 1 patch (21 mg total) onto the skin daily. 28 patch 0  . triamcinolone cream (KENALOG) 0.1 % Apply 1 application topically 2 (two) times daily. 60 g 0   No current facility-administered medications on file prior to visit.    Observations/Objective: NAD. Speaking clearly.  Work of breathing normal.  Alert and oriented. Mood appropriate.   Assessment and Plan: 1. Essential hypertension Continue current regimen. Advised to obtain proper batteries; patient willing to resume keeping a log.  - lisinopril (ZESTRIL) 5 MG tablet; Take 0.5 tablets (2.5 mg total) by mouth every other day.  Dispense: 30 tablet; Refill: 3  2. Tobacco dependence Patient currently smokes 1/2 ppd. Spent 4 minutes counseling on dangers of tobacco use and benefits of quitting, offered pharmacological intervention to aid quitting and patient is not ready to fully quit saying it is a "nervous habit" but wants to continue to cut down. Discussed trying the 1800 Quit Now line again. Congratulated on cutting back by 1/2 of the amount he was previously smoking.    Follow Up Instructions: Return in 3 months for HTN f/u    I discussed the assessment and treatment plan with the patient. The patient was provided an opportunity to ask questions and all were answered. The patient agreed with the plan and demonstrated an understanding of the instructions.   The patient was advised to call back  or seek an in-person evaluation if the symptoms worsen or if the condition fails to improve as anticipated.     I provided 12 minutes total of non-face-to-face time during this encounter including median intraservice time, reviewing previous notes, investigations, ordering medications, medical decision making, coordinating care and patient verbalized understanding at the end of the visit.    Phill Myron, D.O. Primary [facility] at [facility]  01/21/2020,  9:11 AM

## 2020-02-10 ENCOUNTER — Telehealth: Payer: Self-pay | Admitting: Internal Medicine

## 2020-02-10 DIAGNOSIS — R21 Rash and other nonspecific skin eruption: Secondary | ICD-10-CM

## 2020-02-10 MED ORDER — TRIAMCINOLONE ACETONIDE 0.1 % EX CREA: 1.0000 "application " | TOPICAL_CREAM | Freq: Two times a day (BID) | CUTANEOUS | 1 refills | Status: DC

## 2020-02-10 NOTE — Telephone Encounter (Signed)
Rx sent 

## 2020-02-10 NOTE — Telephone Encounter (Signed)
1) Medication(s) Requested (by name):triamcinolone cream (KENALOG) 0.1 % [id number]    2) Pharmacy of Choice:[id number] - [place], [address] [place] [address]   3) Special Requests:   Approved medications will be sent to the pharmacy, we will reach out if there is an issue.  Requests made after 3pm may not be addressed until the following business day!  If a patient is unsure of the name of the medication(s) please note and ask patient to call back when they are able to provide all info, do not send to responsible party until all information is available!

## 2020-04-20 ENCOUNTER — Ambulatory Visit (INDEPENDENT_AMBULATORY_CARE_PROVIDER_SITE_OTHER): Payer: Medicare Other

## 2020-04-20 ENCOUNTER — Ambulatory Visit
Admission: EM | Admit: 2020-04-20 | Discharge: 2020-04-20 | Disposition: A | Discharge status: Home / Self Care | Payer: Medicare Other | Attending: Emergency Medicine | Admitting: Emergency Medicine

## 2020-04-20 DIAGNOSIS — Z720 Tobacco use: Secondary | ICD-10-CM

## 2020-04-20 DIAGNOSIS — R61 Generalized hyperhidrosis: Secondary | ICD-10-CM

## 2020-04-20 DIAGNOSIS — R519 Headache, unspecified: Secondary | ICD-10-CM

## 2020-04-20 DIAGNOSIS — R5381 Other malaise: Secondary | ICD-10-CM | POA: Diagnosis not present

## 2020-04-20 DIAGNOSIS — R6883 Chills (without fever): Secondary | ICD-10-CM | POA: Diagnosis not present

## 2020-04-20 LAB — POC SARS CORONAVIRUS 2 AG -  ED: SARS Coronavirus 2 Ag: NEGATIVE

## 2020-04-20 IMAGING — DX DG CHEST 2V
2 series · 2 of 2 positions shown · non-contrast
Comparison: Radiograph [DATE] and [DATE].

CLINICAL DATA: Chills with diaphoresis and headaches for 1 day.

EXAM:
CHEST - 2 VIEW

[chest pa]
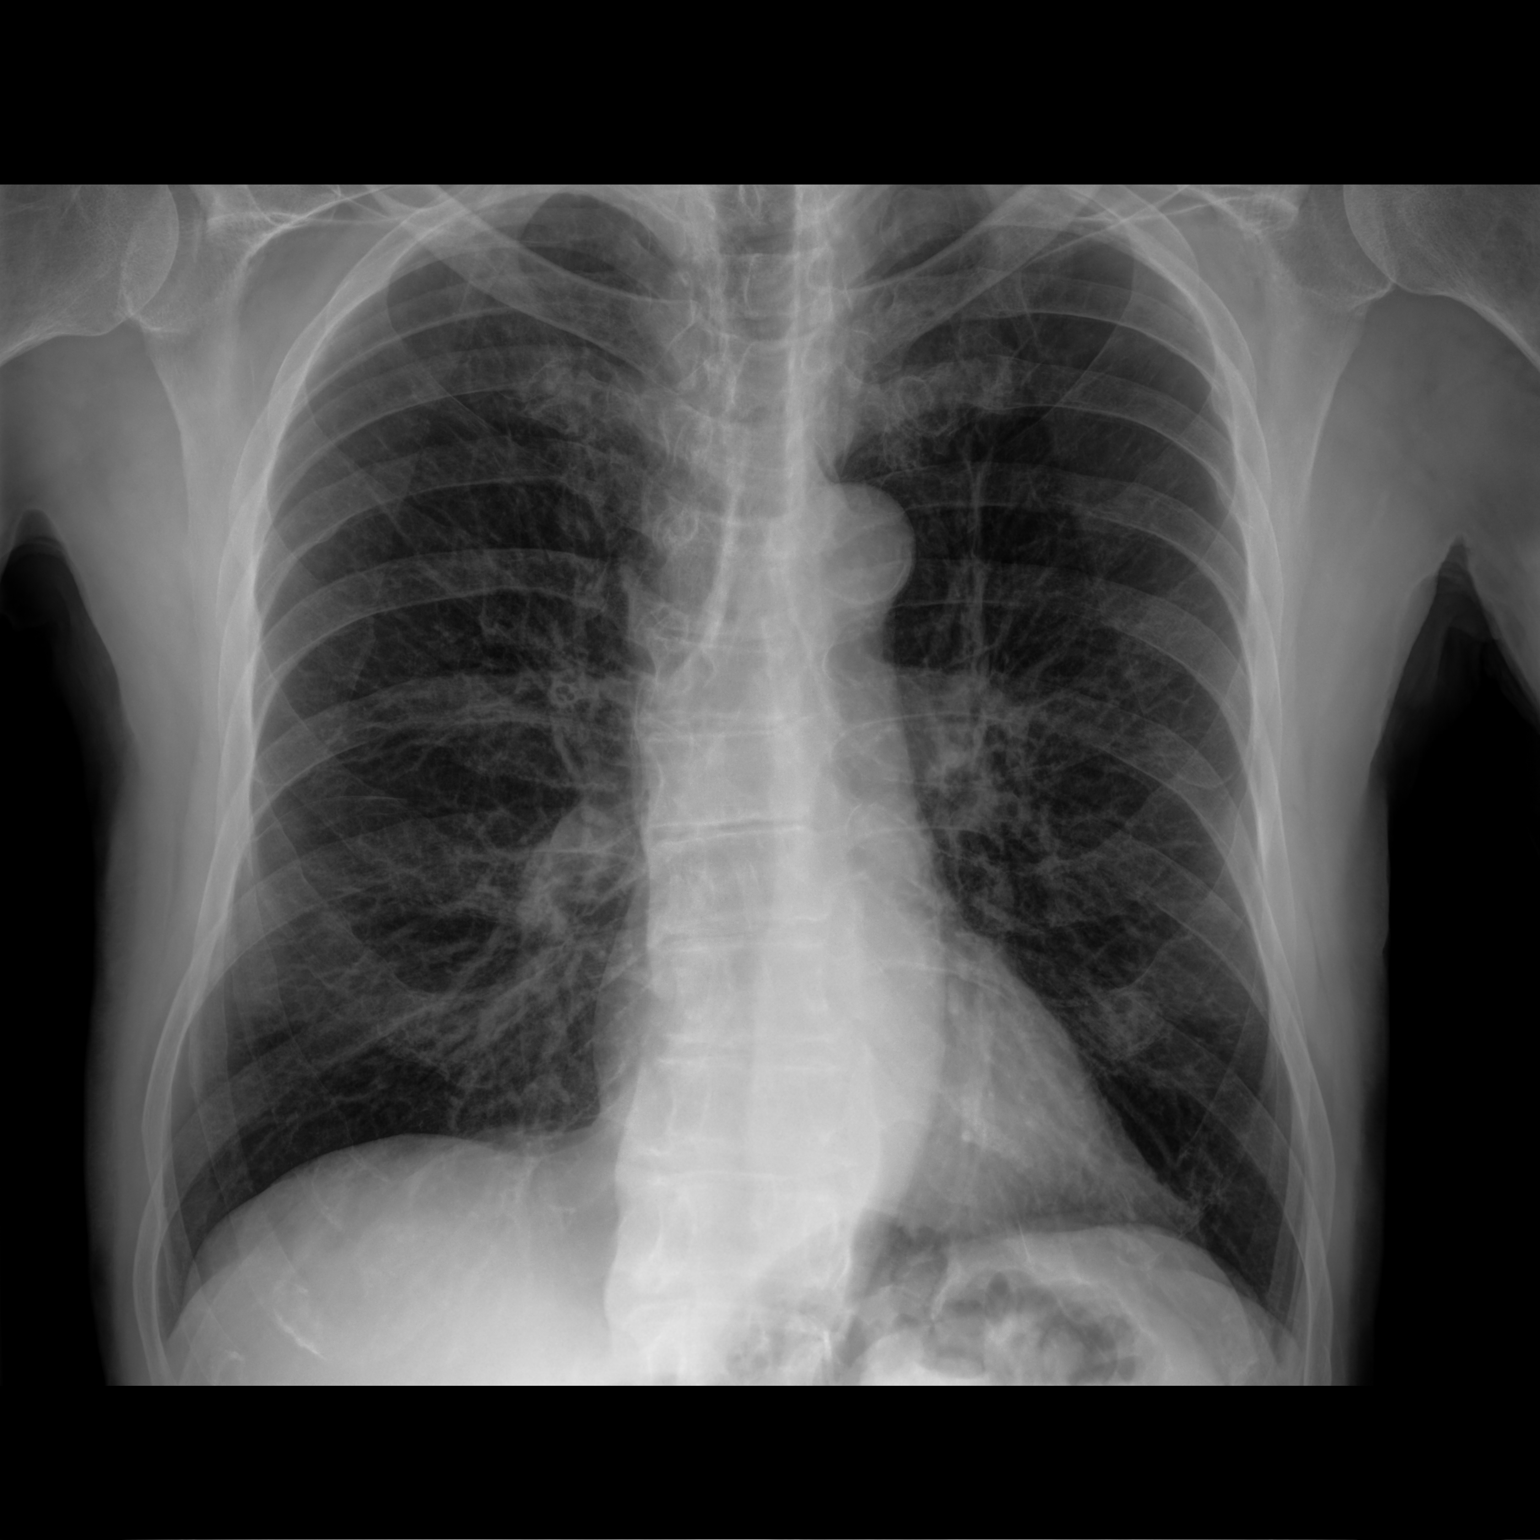

[chest lat]
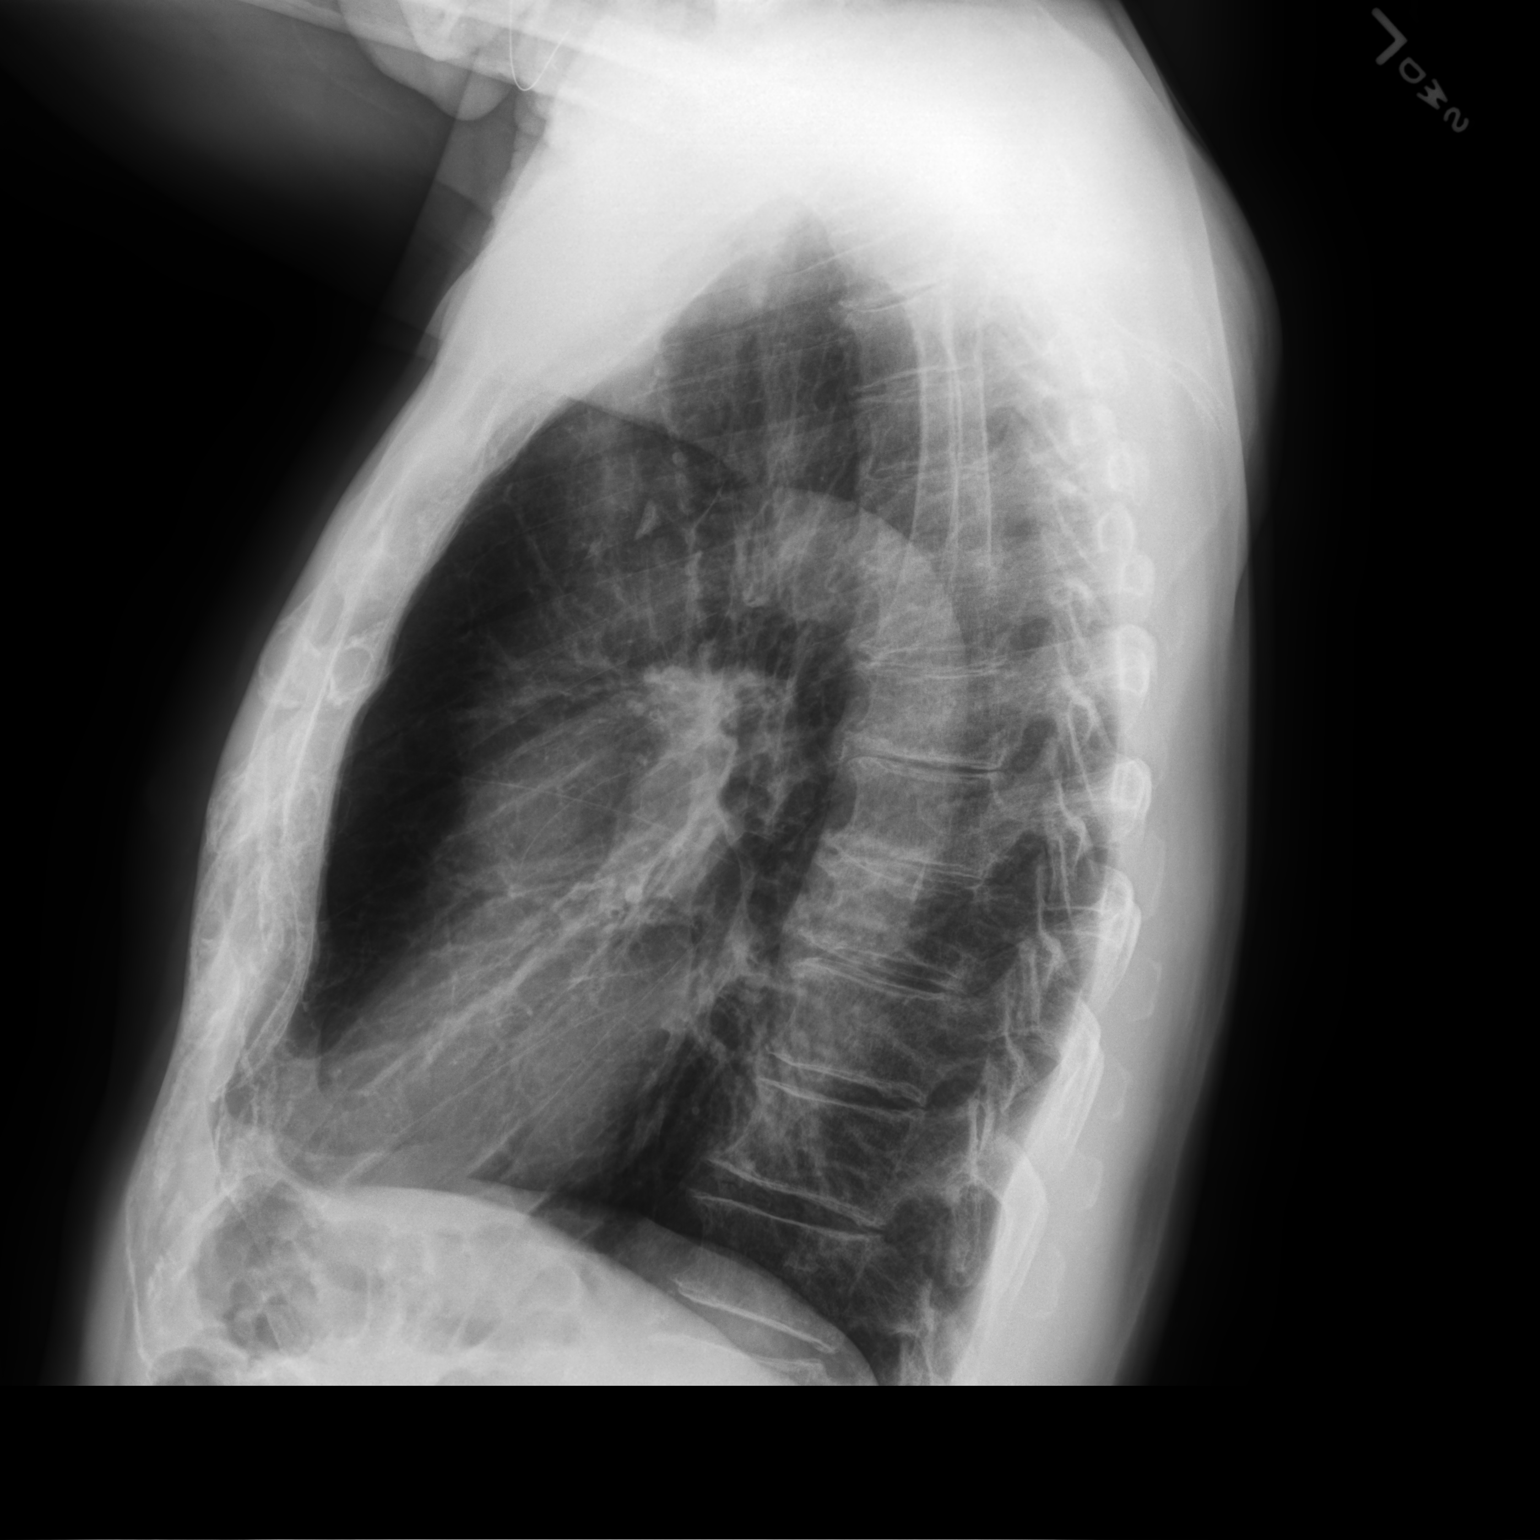

[2 of 2 positions shown; findings below may reference images not displayed]

FINDINGS: The heart size and mediastinal contours are stable. Possible mild
chronic central airway thickening. There is no airspace disease,
edema, pleural effusion or pneumothorax. No acute osseous findings.
IMPRESSION: No acute cardiopulmonary process. Possible mild chronic central
airway thickening.

## 2020-04-20 NOTE — ED Triage Notes (Signed)
Pt c/o sweatiness last night and today. States "I just don't feel good". Pt states had a headache last night.

## 2020-04-20 NOTE — ED Provider Notes (Signed)
[name]    CSN: [id number] Arrival date & time: 04/20/20  1050      History   Chief Complaint Chief Complaint  Patient presents with  . Headache    HPI Ronald Lowe is a 64 y.o. male with history of hypertension, tobacco use, alcohol use presenting for malaise since yesterday.  Patient had a general headache, which since resolved, as well as night sweats.  Patient states he did sleep with the heat on, though just "does not feel good".  Patient denying change in cough, breathing, chest pain, leg swelling.  No fever, known sick contacts.  Has not tried thing for this.   Past Medical History:  Diagnosis Date  . Hypertension   . Tobacco abuse     Patient Active Problem List   Diagnosis Date Noted  . Positive hepatitis C antibody test 10/02/2019  . Tobacco dependence 09/24/2019  . Alcohol use disorder, mild, abuse 09/24/2019  . Orthostatic hypotension 06/12/2014  . EKG abnormality 06/12/2014  . HTN (hypertension) 06/12/2014    Past Surgical History:  Procedure Laterality Date  . TONSILLECTOMY         Home Medications    Prior to Admission medications   Medication Sig Start Date End Date Taking? Authorizing Provider  amLODipine (NORVASC) 5 MG tablet Take 1 tablet (5 mg total) by mouth daily. Patient taking differently: Take 5 mg by mouth every other day.  09/24/19   Ladell Pier, MD  lisinopril (ZESTRIL) 5 MG tablet Take 0.5 tablets (2.5 mg total) by mouth every other day. 01/21/20   Nicolette Bang, DO  nicotine (NICODERM CQ - DOSED IN MG/24 HOURS) 21 mg/24hr patch Place 1 patch (21 mg total) onto the skin daily. 09/24/19   Ladell Pier, MD  triamcinolone cream (KENALOG) 0.1 % Apply 1 application topically 2 (two) times daily. 02/10/20   Nicolette Bang, DO    Family History Family History  Problem Relation Age of Onset  . Emphysema Father   . CAD Father        CABG  . Stroke Mother   . CAD Brother 71       CABG    . Hypertension Brother     Social History Social History   Tobacco Use  . Smoking status: Current Every Day Smoker    Packs/day: 0.50    Years: 40.00    Pack years: 20.00    Types: Cigarettes  . Smokeless tobacco: Never Used  Substance Use Topics  . Alcohol use: Yes    Alcohol/week: 42.0 standard drinks    Types: 28 Cans of beer, 14 Shots of liquor per week  . Drug use: No     Allergies   Patient has no known allergies.   Review of Systems As per HPI   Physical Exam Triage Vital Signs ED Triage Vitals  Enc Vitals Group     BP      Pulse      Resp      Temp      Temp src      SpO2      Weight      Height      Head Circumference      Peak Flow      Pain Score      Pain Loc      Pain Edu?      Excl. in [place]?    No data found.  Updated Vital Signs BP (!) 142/99 (  BP Location: Left Arm)   Pulse (!) 104   Temp 98.5 F (36.9 C) (Oral)   Resp 18   SpO2 96%   Visual Acuity Right Eye Distance:   Left Eye Distance:   Bilateral Distance:    Right Eye Near:   Left Eye Near:    Bilateral Near:     Physical Exam Constitutional:      General: He is not in acute distress.    Appearance: He is not toxic-appearing or diaphoretic.  HENT:     Head: Normocephalic and atraumatic.     Mouth/Throat:     Mouth: Mucous membranes are moist.     Pharynx: Oropharynx is clear.  Eyes:     General: No scleral icterus.    Conjunctiva/sclera: Conjunctivae normal.     Pupils: Pupils are equal, round, and reactive to light.  Neck:     Comments: Trachea midline, negative JVD Cardiovascular:     Rate and Rhythm: Normal rate and regular rhythm.  Pulmonary:     Effort: Pulmonary effort is normal. No respiratory distress.     Breath sounds: No wheezing.     Comments: Decreased breath sounds bilaterally Musculoskeletal:     Cervical back: Neck supple. No tenderness.  Lymphadenopathy:     Cervical: No cervical adenopathy.  Skin:    Capillary Refill: Capillary refill  takes less than 2 seconds.     Coloration: Skin is not jaundiced or pale.     Findings: No rash.  Neurological:     Mental Status: He is alert and oriented to person, place, and time.      UC Treatments / Results  Labs (all labs ordered are listed, but only abnormal results are displayed) Labs Reviewed  POC SARS CORONAVIRUS 2 AG -  ED - Normal  NOVEL CORONAVIRUS, NAA    EKG   Radiology DG Chest 2 View  Result Date: 04/20/2020 CLINICAL DATA:  Chills with diaphoresis and headaches for 1 day. EXAM: CHEST - 2 VIEW COMPARISON:  Radiograph 06/12/2014 and 12/23/2006. FINDINGS: The heart size and mediastinal contours are stable. Possible mild chronic central airway thickening. There is no airspace disease, edema, pleural effusion or pneumothorax. No acute osseous findings. IMPRESSION: No acute cardiopulmonary process. Possible mild chronic central airway thickening. Electronically Signed   By: Richardean Sale M.D.   On: 04/20/2020 12:19    Procedures Procedures (including critical care time)  Medications Ordered in UC Medications - No data to display  Initial Impression / Assessment and Plan / UC Course  I have reviewed the triage vital signs and the nursing notes.  Pertinent labs & imaging results that were available during my care of the patient were reviewed by me and considered in my medical decision making (see chart for details).     Febrile, nontoxic in office today.  Rapid Covid negative, PCR pending: Patient to quarantine until results are back.  Chest x-ray done office, reviewed by me radiology.  Compared to previous from 12/23/2006: No acute cardiopulmonary process, though possible mild chronic central airway thickening.  Reviewed findings with patient verbalized understanding.Low concern for pneumonia, though discussed that given patient's chronic tobacco use patient should have further evaluation for COPD with his PCP.  Return precautions discussed, patient verbalized  understanding and is agreeable to plan. Final Clinical Impressions(s) / UC Diagnoses   Final diagnoses:  Malaise  Tobacco use     Discharge Instructions     Your COVID test is pending - it is important to  quarantine / isolate at home until your results are back. If you test positive and would like further evaluation for persistent or worsening symptoms, you may schedule an E-visit or virtual (video) visit throughout the [facility] app or website.  PLEASE NOTE: If you develop severe chest pain or shortness of breath please go to the ER or call 9-1-1 for further evaluation --> DO NOT schedule electronic or virtual visits for this. Please call our office for further guidance / recommendations as needed.  For information about the Covid vaccine, please visit [email or web address]    ED Prescriptions    None     PDMP not reviewed this encounter.   [place], [place], [place] 04/20/20 1713

## 2020-04-20 NOTE — Discharge Instructions (Signed)
Your COVID test is pending - it is important to quarantine / isolate at home until your results are back. °If you test positive and would like further evaluation for persistent or worsening symptoms, you may schedule an E-visit or virtual (video) visit throughout the [facility] app or website. ° °PLEASE NOTE: If you develop severe chest pain or shortness of breath please go to the ER or call 9-1-1 for further evaluation --> DO NOT schedule electronic or virtual visits for this. °Please call our office for further guidance / recommendations as needed. ° °For information about the Covid vaccine, please visit [email or web address] °

## 2020-04-21 LAB — SARS-COV-2, NAA 2 DAY TAT

## 2020-04-21 LAB — NOVEL CORONAVIRUS, NAA: SARS-CoV-2, NAA: NOT DETECTED

## 2020-05-01 ENCOUNTER — Other Ambulatory Visit: Payer: Self-pay

## 2020-05-01 ENCOUNTER — Telehealth (INDEPENDENT_AMBULATORY_CARE_PROVIDER_SITE_OTHER): Payer: Medicare Other | Admitting: Internal Medicine

## 2020-05-01 ENCOUNTER — Encounter: Payer: Self-pay | Admitting: Internal Medicine

## 2020-05-01 DIAGNOSIS — R5383 Other fatigue: Secondary | ICD-10-CM | POA: Diagnosis not present

## 2020-05-01 DIAGNOSIS — M545 Low back pain, unspecified: Secondary | ICD-10-CM

## 2020-05-01 DIAGNOSIS — G8929 Other chronic pain: Secondary | ICD-10-CM

## 2020-05-01 DIAGNOSIS — R5381 Other malaise: Secondary | ICD-10-CM | POA: Diagnosis not present

## 2020-05-01 MED ORDER — MELOXICAM 15 MG PO TABS: 15.0000 mg | ORAL_TABLET | Freq: Every day | ORAL | 0 refills | Status: DC

## 2020-05-01 MED ORDER — CYCLOBENZAPRINE HCL 10 MG PO TABS: 10.0000 mg | ORAL_TABLET | Freq: Three times a day (TID) | ORAL | 0 refills | Status: DC | PRN

## 2020-05-01 NOTE — Progress Notes (Signed)
Virtual Visit via Telephone Note  I connected with Ronald Lowe, on 05/01/2020 at 10:33 AM by telephone due to the COVID-19 pandemic and verified that I am speaking with the correct person using two identifiers.   Consent: I discussed the limitations, risks, security and privacy concerns of performing an evaluation and management service by telephone and the availability of in person appointments. I also discussed with the patient that there may be a patient responsible charge related to this service. The patient expressed understanding and agreed to proceed.   Location of Patient: Home   Location of Provider: Clinic    Persons participating in Telemedicine visit: De[facility] Dr. Juleen China      History of Present Illness: Patient has follow up for urgent care visit on 5/3 for malaise, headache, and night sweats x1 day. COVID testing was negative. CXR was done with no acute cardiopulmonary process, though possible mild chronic central airway thickening. He reports that all of these symptoms have resolved.   He is now complaining of low back pain. Located on left lower back, patient reports 6 inches above pelvis. Reports that it has been occurring for 2 months. This pain started after he had to carry a toilet out of his house. Reports by the time he got the toilet out of the house it was hurting. It has improved for the past month. Rated as 8/10. Hurts with bending over and sitting for long periods of time. No changes in bowel/bladder function and no incontinence. No radiation down leg of pain. No numbness or tingling in the inner thighs. No fevers. No history of cancer. Has been taking Ibuprofen occasionally.    Past Medical History:  Diagnosis Date  . Hypertension   . Tobacco abuse    No Known Allergies  Current Outpatient Medications on File Prior to Visit  Medication Sig Dispense Refill  . amLODipine (NORVASC) 5 MG tablet Take 1 tablet (5 mg total) by mouth  daily. (Patient taking differently: Take 5 mg by mouth every other day. ) 30 tablet 3  . lisinopril (ZESTRIL) 5 MG tablet Take 0.5 tablets (2.5 mg total) by mouth every other day. 30 tablet 3  . nicotine (NICODERM CQ - DOSED IN MG/24 HOURS) 21 mg/24hr patch Place 1 patch (21 mg total) onto the skin daily. 28 patch 0  . triamcinolone cream (KENALOG) 0.1 % Apply 1 application topically 2 (two) times daily. 60 g 1   No current facility-administered medications on file prior to visit.    Observations/Objective: NAD. Speaking clearly.  Work of breathing normal.  Alert and oriented. Mood appropriate.   Assessment and Plan: 1. Malaise and fatigue Resolved since urgent care visit. No further work up.   2. Chronic left-sided low back pain without sciatica Sounds most consistent with a muscle strain given occurred after heavy lifting/carrying/bending. Will treat with NSAID and muscle relaxant. Discussed heat to the area and epsom salt baths. Avoid heavy lifting. If not improving within one month, return for x-ray.  - meloxicam (MOBIC) 15 MG tablet; Take 1 tablet (15 mg total) by mouth daily.  Dispense: 30 tablet; Refill: 0 - cyclobenzaprine (FLEXERIL) 10 MG tablet; Take 1 tablet (10 mg total) by mouth 3 (three) times daily as needed for muscle spasms.  Dispense: 30 tablet; Refill: 0   Follow Up Instructions: PRN and for chronic medical care    I discussed the assessment and treatment plan with the patient. The patient was provided an opportunity to ask  questions and all were answered. The patient agreed with the plan and demonstrated an understanding of the instructions.   The patient was advised to call back or seek an in-person evaluation if the symptoms worsen or if the condition fails to improve as anticipated.     I provided 12 minutes total of non-face-to-face time during this encounter including median intraservice time, reviewing previous notes, investigations, ordering medications,  medical decision making, coordinating care and patient verbalized understanding at the end of the visit.    Phill Myron, D.O. Primary [facility] at [facility]  05/01/2020, 10:33 AM

## 2020-05-28 ENCOUNTER — Other Ambulatory Visit: Payer: Self-pay | Admitting: Internal Medicine

## 2020-05-28 DIAGNOSIS — I10 Essential (primary) hypertension: Secondary | ICD-10-CM

## 2020-06-08 ENCOUNTER — Other Ambulatory Visit: Payer: Self-pay

## 2020-06-08 DIAGNOSIS — M545 Low back pain, unspecified: Secondary | ICD-10-CM

## 2020-06-08 DIAGNOSIS — G8929 Other chronic pain: Secondary | ICD-10-CM

## 2020-06-08 MED ORDER — MELOXICAM 15 MG PO TABS: 15.0000 mg | ORAL_TABLET | Freq: Every day | ORAL | 1 refills | Status: DC

## 2020-08-26 ENCOUNTER — Other Ambulatory Visit: Payer: Self-pay

## 2020-08-26 DIAGNOSIS — I10 Essential (primary) hypertension: Secondary | ICD-10-CM

## 2020-08-26 MED ORDER — AMLODIPINE BESYLATE 5 MG PO TABS: 5.0000 mg | ORAL_TABLET | ORAL | 0 refills | Status: DC

## 2020-09-08 ENCOUNTER — Telehealth: Payer: Self-pay | Admitting: Oncology

## 2020-09-08 ENCOUNTER — Emergency Department (HOSPITAL_COMMUNITY): Payer: Medicare Other

## 2020-09-08 ENCOUNTER — Other Ambulatory Visit: Payer: Self-pay

## 2020-09-08 ENCOUNTER — Emergency Department (HOSPITAL_COMMUNITY)
Admission: EM | Admit: 2020-09-08 | Discharge: 2020-09-08 | Discharge status: Against Medical Advice | Payer: Medicare Other | Attending: Emergency Medicine | Admitting: Emergency Medicine

## 2020-09-08 ENCOUNTER — Ambulatory Visit (HOSPITAL_COMMUNITY)
Admission: EM | Admit: 2020-09-08 | Discharge: 2020-09-08 | Disposition: A | Discharge status: Home / Self Care | Payer: Medicare Other

## 2020-09-08 ENCOUNTER — Encounter (HOSPITAL_COMMUNITY): Payer: Self-pay | Admitting: Emergency Medicine

## 2020-09-08 DIAGNOSIS — C641 Malignant neoplasm of right kidney, except renal pelvis: Secondary | ICD-10-CM | POA: Insufficient documentation

## 2020-09-08 DIAGNOSIS — R509 Fever, unspecified: Secondary | ICD-10-CM

## 2020-09-08 DIAGNOSIS — Z79899 Other long term (current) drug therapy: Secondary | ICD-10-CM | POA: Diagnosis not present

## 2020-09-08 DIAGNOSIS — R591 Generalized enlarged lymph nodes: Secondary | ICD-10-CM | POA: Diagnosis not present

## 2020-09-08 DIAGNOSIS — R1909 Other intra-abdominal and pelvic swelling, mass and lump: Secondary | ICD-10-CM

## 2020-09-08 DIAGNOSIS — Z20822 Contact with and (suspected) exposure to covid-19: Secondary | ICD-10-CM | POA: Diagnosis not present

## 2020-09-08 DIAGNOSIS — F17210 Nicotine dependence, cigarettes, uncomplicated: Secondary | ICD-10-CM | POA: Diagnosis not present

## 2020-09-08 DIAGNOSIS — R1031 Right lower quadrant pain: Secondary | ICD-10-CM | POA: Diagnosis not present

## 2020-09-08 DIAGNOSIS — I10 Essential (primary) hypertension: Secondary | ICD-10-CM | POA: Diagnosis not present

## 2020-09-08 DIAGNOSIS — D49519 Neoplasm of unspecified behavior of unspecified kidney: Secondary | ICD-10-CM

## 2020-09-08 LAB — CBC WITH DIFFERENTIAL/PLATELET
Abs Immature Granulocytes: 0.05 10*3/uL (ref 0.00–0.07)
Basophils Absolute: 0.1 10*3/uL (ref 0.0–0.1)
Basophils Relative: 1 %
Eosinophils Absolute: 0.1 10*3/uL (ref 0.0–0.5)
Eosinophils Relative: 1 %
HCT: 47.9 % (ref 39.0–52.0)
Hemoglobin: 16.2 g/dL (ref 13.0–17.0)
Immature Granulocytes: 1 %
Lymphocytes Relative: 19 %
Lymphs Abs: 1.5 10*3/uL (ref 0.7–4.0)
MCH: 34.8 pg — ABNORMAL HIGH (ref 26.0–34.0)
MCHC: 33.8 g/dL (ref 30.0–36.0)
MCV: 103 fL — ABNORMAL HIGH (ref 80.0–100.0)
Monocytes Absolute: 0.8 10*3/uL (ref 0.1–1.0)
Monocytes Relative: 10 %
Neutro Abs: 5.6 10*3/uL (ref 1.7–7.7)
Neutrophils Relative %: 68 %
Platelets: 231 10*3/uL (ref 150–400)
RBC: 4.65 MIL/uL (ref 4.22–5.81)
RDW: 13.8 % (ref 11.5–15.5)
WBC: 8.1 10*3/uL (ref 4.0–10.5)
nRBC: 0 % (ref 0.0–0.2)

## 2020-09-08 LAB — COMPREHENSIVE METABOLIC PANEL
ALT: 17 U/L (ref 0–44)
AST: 24 U/L (ref 15–41)
Albumin: 3.9 g/dL (ref 3.5–5.0)
Alkaline Phosphatase: 51 U/L (ref 38–126)
Anion gap: 15 (ref 5–15)
BUN: 10 mg/dL (ref 8–23)
CO2: 20 mmol/L — ABNORMAL LOW (ref 22–32)
Calcium: 9 mg/dL (ref 8.9–10.3)
Chloride: 99 mmol/L (ref 98–111)
Creatinine, Ser: 1.46 mg/dL — ABNORMAL HIGH (ref 0.61–1.24)
GFR calc Af Amer: 58 mL/min — ABNORMAL LOW (ref >60)
GFR calc non Af Amer: 50 mL/min — ABNORMAL LOW (ref >60)
Glucose, Bld: 143 mg/dL — ABNORMAL HIGH (ref 70–99)
Potassium: 4 mmol/L (ref 3.5–5.1)
Sodium: 134 mmol/L — ABNORMAL LOW (ref 135–145)
Total Bilirubin: 1.1 mg/dL (ref 0.3–1.2)
Total Protein: 7.2 g/dL (ref 6.5–8.1)

## 2020-09-08 LAB — URINALYSIS, ROUTINE W REFLEX MICROSCOPIC
Bilirubin Urine: NEGATIVE
Glucose, UA: NEGATIVE mg/dL
Hgb urine dipstick: NEGATIVE
Ketones, ur: 5 mg/dL — AB
Leukocytes,Ua: NEGATIVE
Nitrite: NEGATIVE
Protein, ur: NEGATIVE mg/dL
Specific Gravity, Urine: >1.046 — ABNORMAL HIGH (ref 1.005–1.030)
pH: 5 (ref 5.0–8.0)

## 2020-09-08 LAB — SARS CORONAVIRUS 2 BY RT PCR (HOSPITAL ORDER, PERFORMED IN ~~LOC~~ HOSPITAL LAB): SARS Coronavirus 2: NEGATIVE

## 2020-09-08 LAB — LACTIC ACID, PLASMA
Lactic Acid, Venous: 2.4 mmol/L (ref 0.5–1.9)
Lactic Acid, Venous: 1.3 mmol/L (ref 0.5–1.9)

## 2020-09-08 IMAGING — CT CT ABD-PELV W/ CM
2 of 5 series · 15 of 46 positions shown, 17 images · IV contrast (omnipaque)
Comparison: Renal ultrasound [DATE]

CLINICAL DATA: Right inguinal lymphadenopathy, fever

EXAM:
CT ABDOMEN AND PELVIS WITH CONTRAST
TECHNIQUE: Multidetector CT imaging of the abdomen and pelvis was performed
using the standard protocol following bolus administration of
intravenous contrast.
CONTRAST:  100mL OMNIPAQUE IOHEXOL 300 MG/ML  SOLN

[Series 3: abdomen 5.0 · axial · 0.85mm/px · z∈[+786,+1191]mm · 12 of 95 slices shown, 14 images]
[im 7/95  soft-tissue]
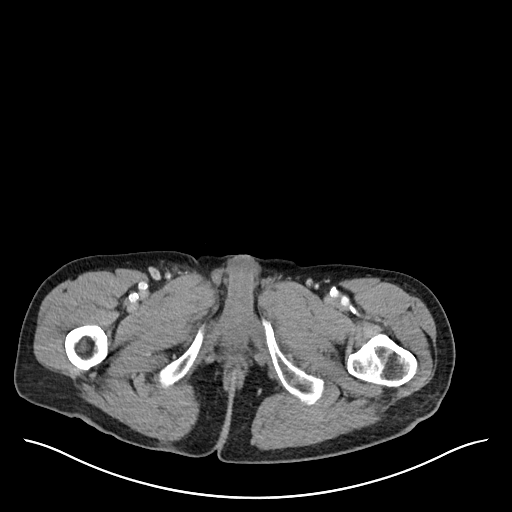
[im 7/95  bone]
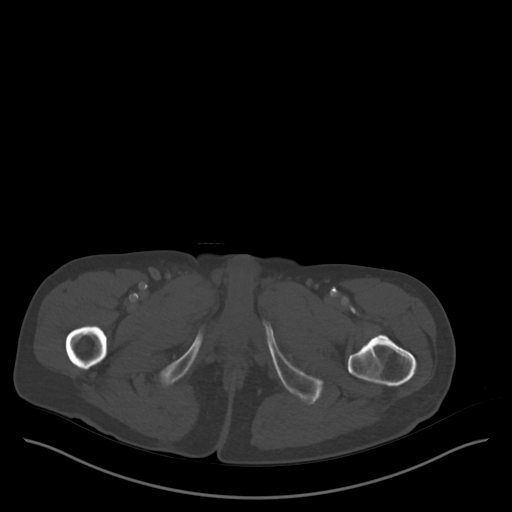
[im 13/95  soft-tissue]
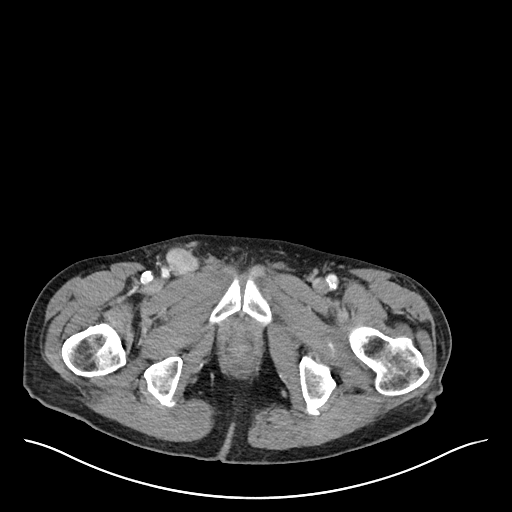
[im 19/95  soft-tissue]
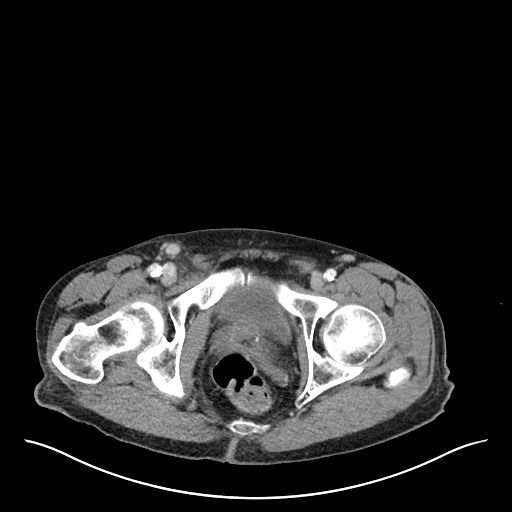
[im 32/95  soft-tissue]
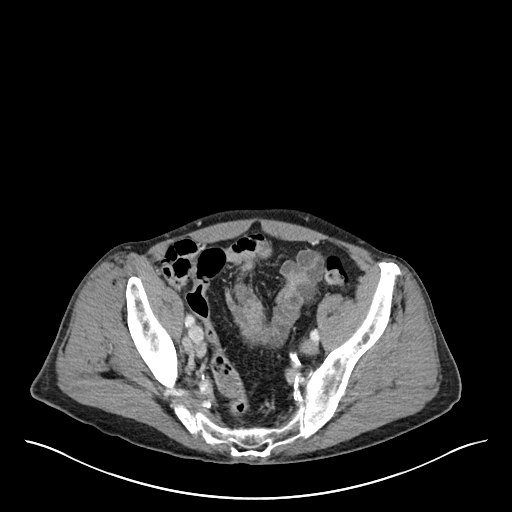
[im 38/95  soft-tissue]
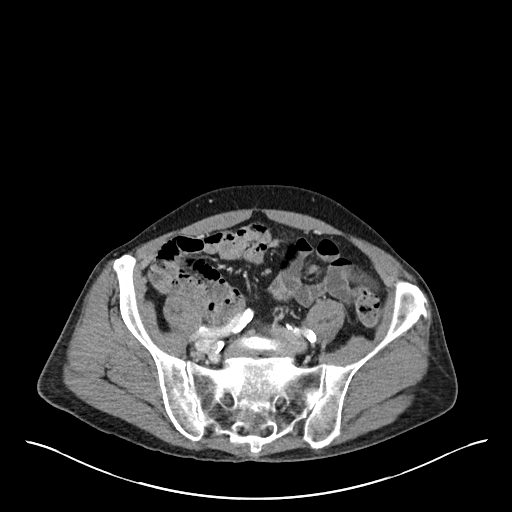
[im 44/95  soft-tissue]
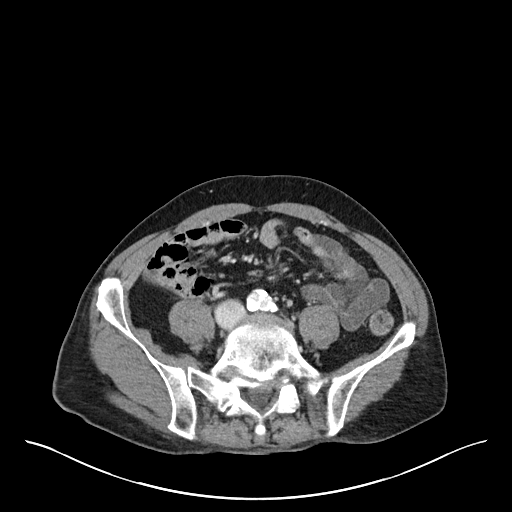
[im 51/95  soft-tissue]
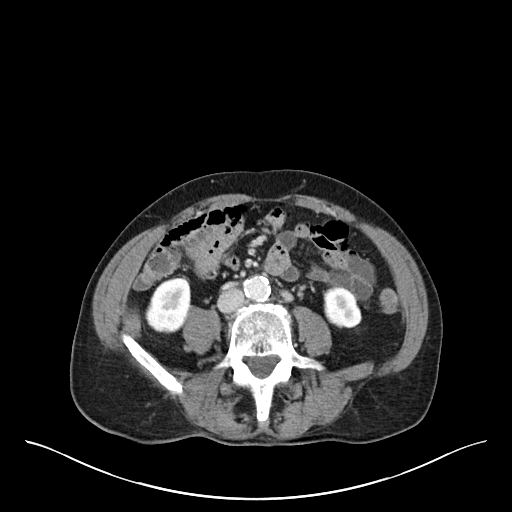
[im 57/95  soft-tissue]
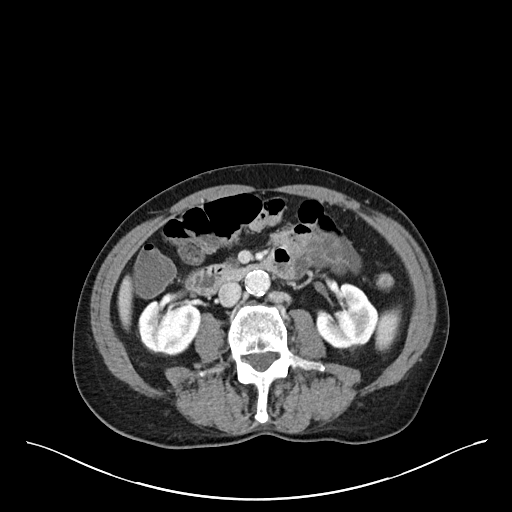
[im 63/95  soft-tissue]
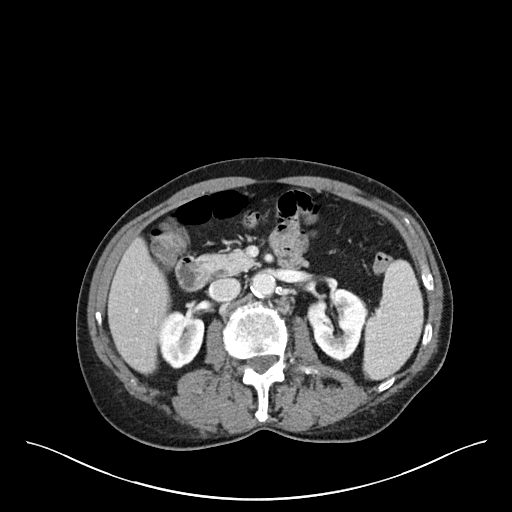
[im 63/95  bone]
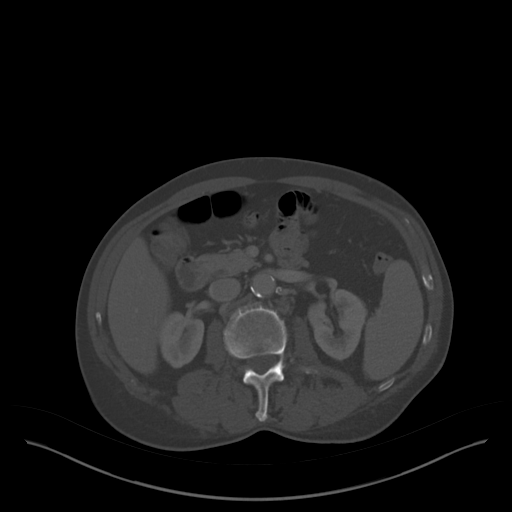
[im 76/95  soft-tissue]
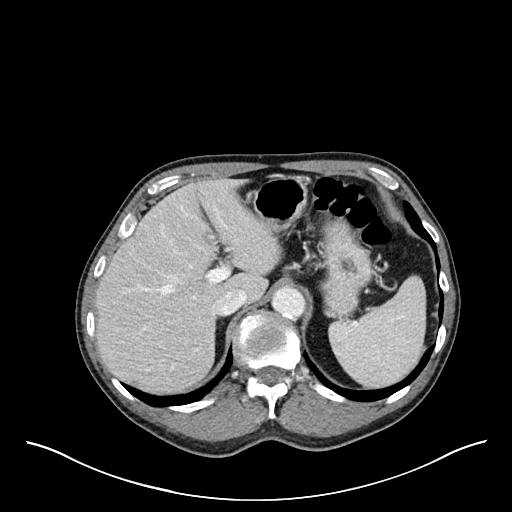
[im 82/95  soft-tissue]
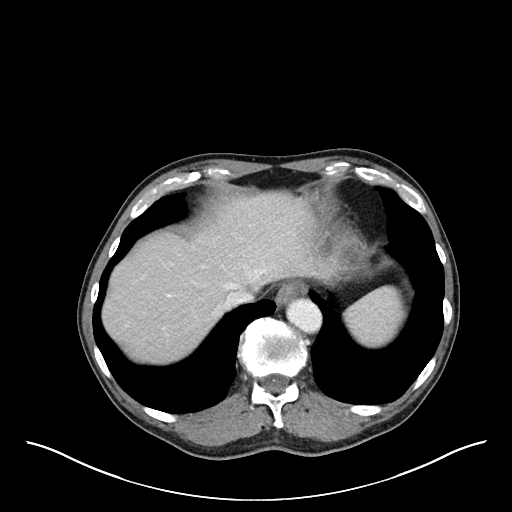
[im 88/95  soft-tissue]
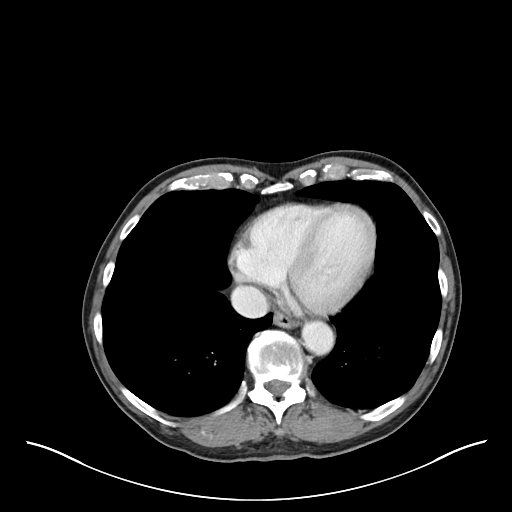

[Series 6: abdomen 3.0 mpr cor · coronal · 0.72mm/px · 3 of 92 slices shown]
[im 31/92  soft-tissue]
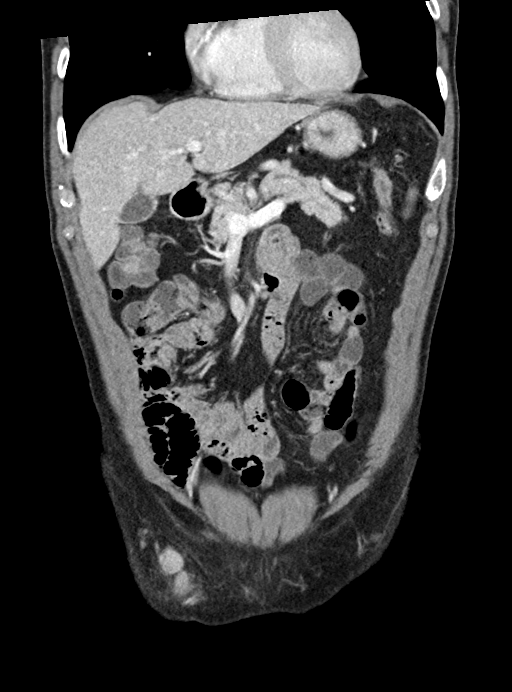
[im 41/92  soft-tissue]
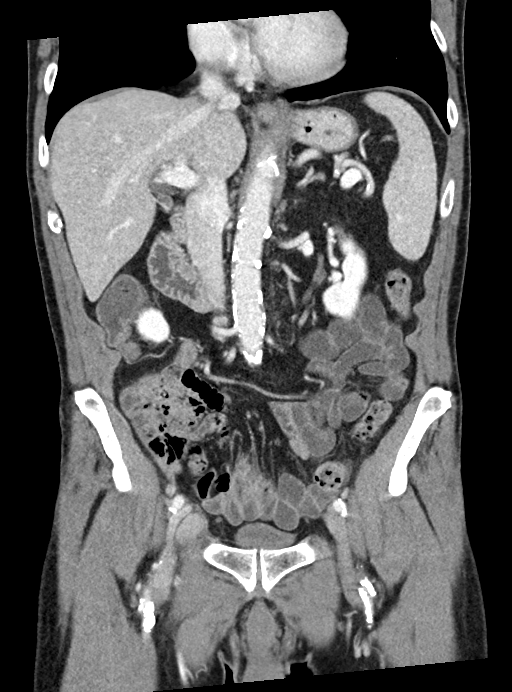
[im 51/92  soft-tissue]
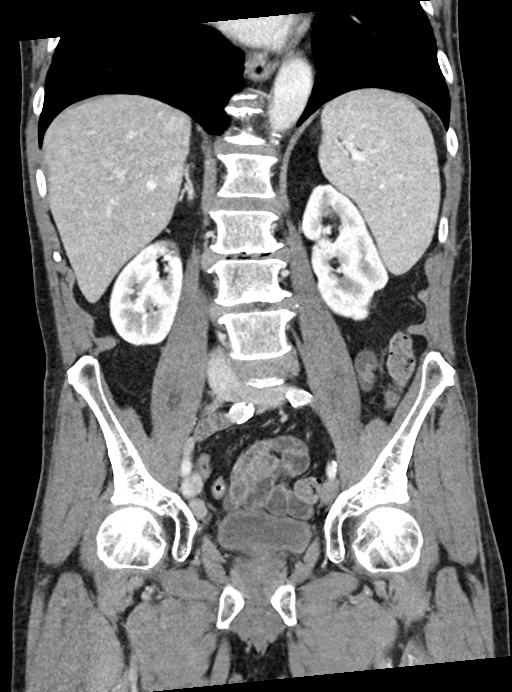

[15 of 46 positions shown; findings below may reference images not displayed]

FINDINGS: Lower chest: Lung bases are clear. No pulmonary nodules identified.
Heart size is normal.

Hepatobiliary: No focal liver lesion is identified. Unremarkable
appearance of the gallbladder. No biliary dilatation.

Pancreas: Unremarkable. No pancreatic ductal dilatation or
surrounding inflammatory changes.

Spleen: Normal in size without focal abnormality.

Adrenals/Urinary Tract: No adrenal nodule. Heterogeneously enhancing
solid mass within the posterolateral aspect of the mid to upper pole
of the right kidney measuring 2.0 x 1.3 x 1.6 cm (series 3, image
36; series 6, image 60). Left kidney is unremarkable. No renal
stone. No hydronephrosis. Right renal vein appears unremarkable.
Urinary bladder unremarkable for the degree of distension.

Stomach/Bowel: Stomach is within normal limits. No evidence of bowel
wall thickening, distention, or inflammatory changes.

Vascular/Lymphatic: Multiple enlarged right external iliac chain and
right inguinal lymph nodes, largest within the right inguinal region
measuring 2.2 cm short axis (series 3, image 82). Extensive
calcified atherosclerotic plaque of the aortoiliac axis. No
aneurysm.

Reproductive: Prostate is unremarkable.

Other: No free fluid. No abdominopelvic fluid collection. No
pneumoperitoneum. No abdominal wall hernia.

Musculoskeletal: No acute or significant osseous findings. Chronic
appearing bilateral L5 pars interarticularis defects. No suspicious
bony lesion.
IMPRESSION: 1. Heterogeneously enhancing 2.0 cm solid mass within the right
kidney, highly suspicious for renal cell carcinoma.
2. Multiple enlarged right external iliac chain and right inguinal
lymph nodes, largest within the right inguinal region measuring up
to 2.2 cm. Findings are most suggestive of metastatic disease. A
primary lymphoproliferative process such as lymphoma is also a
consideration.

Aortic Atherosclerosis ([F8]-[F8]).

## 2020-09-08 IMAGING — CR DG CHEST 2V
2 series · 2 of 2 positions shown · non-contrast
Comparison: [DATE].

CLINICAL DATA: Cough.

EXAM:
CHEST - 2 VIEW

[chest pa]
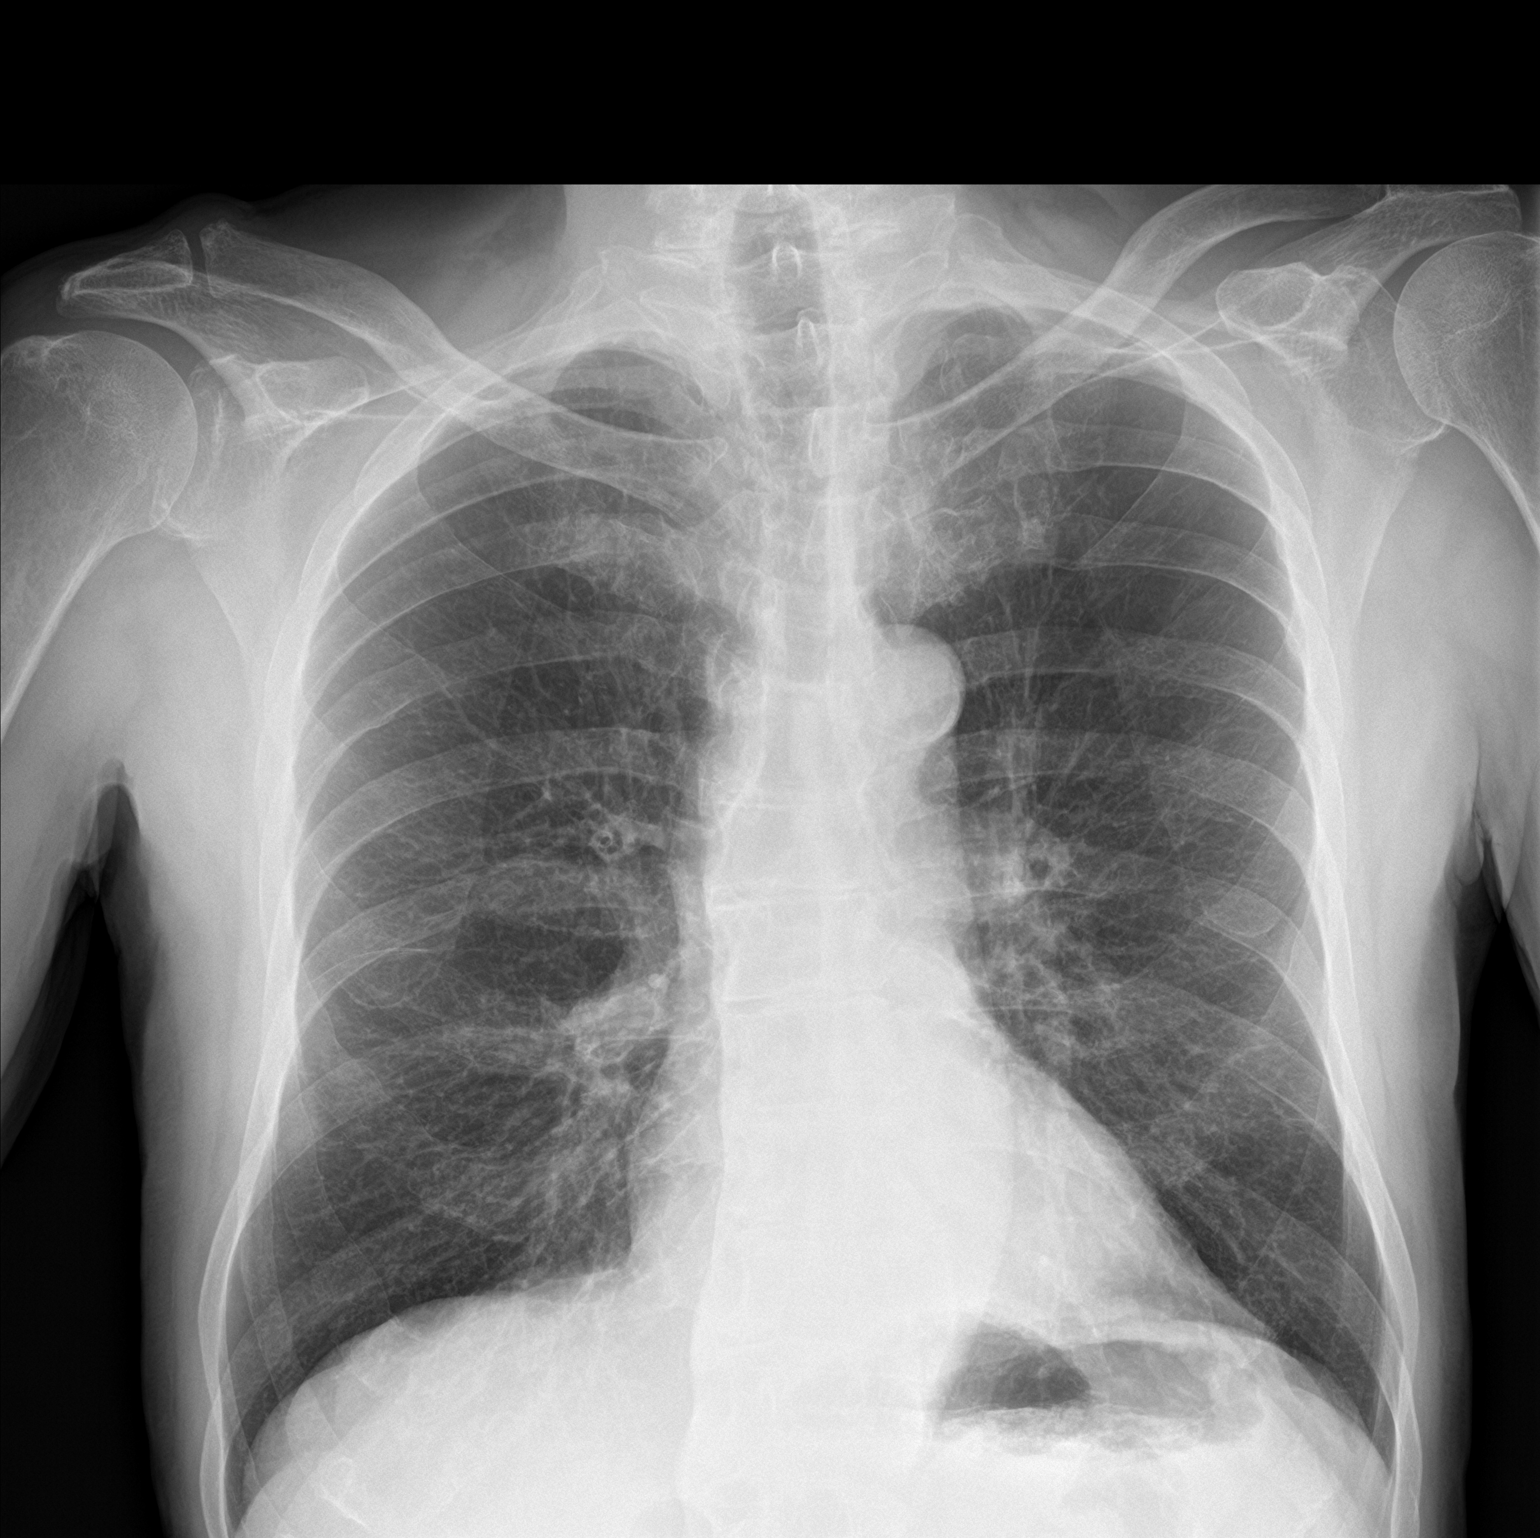

[chest lat]
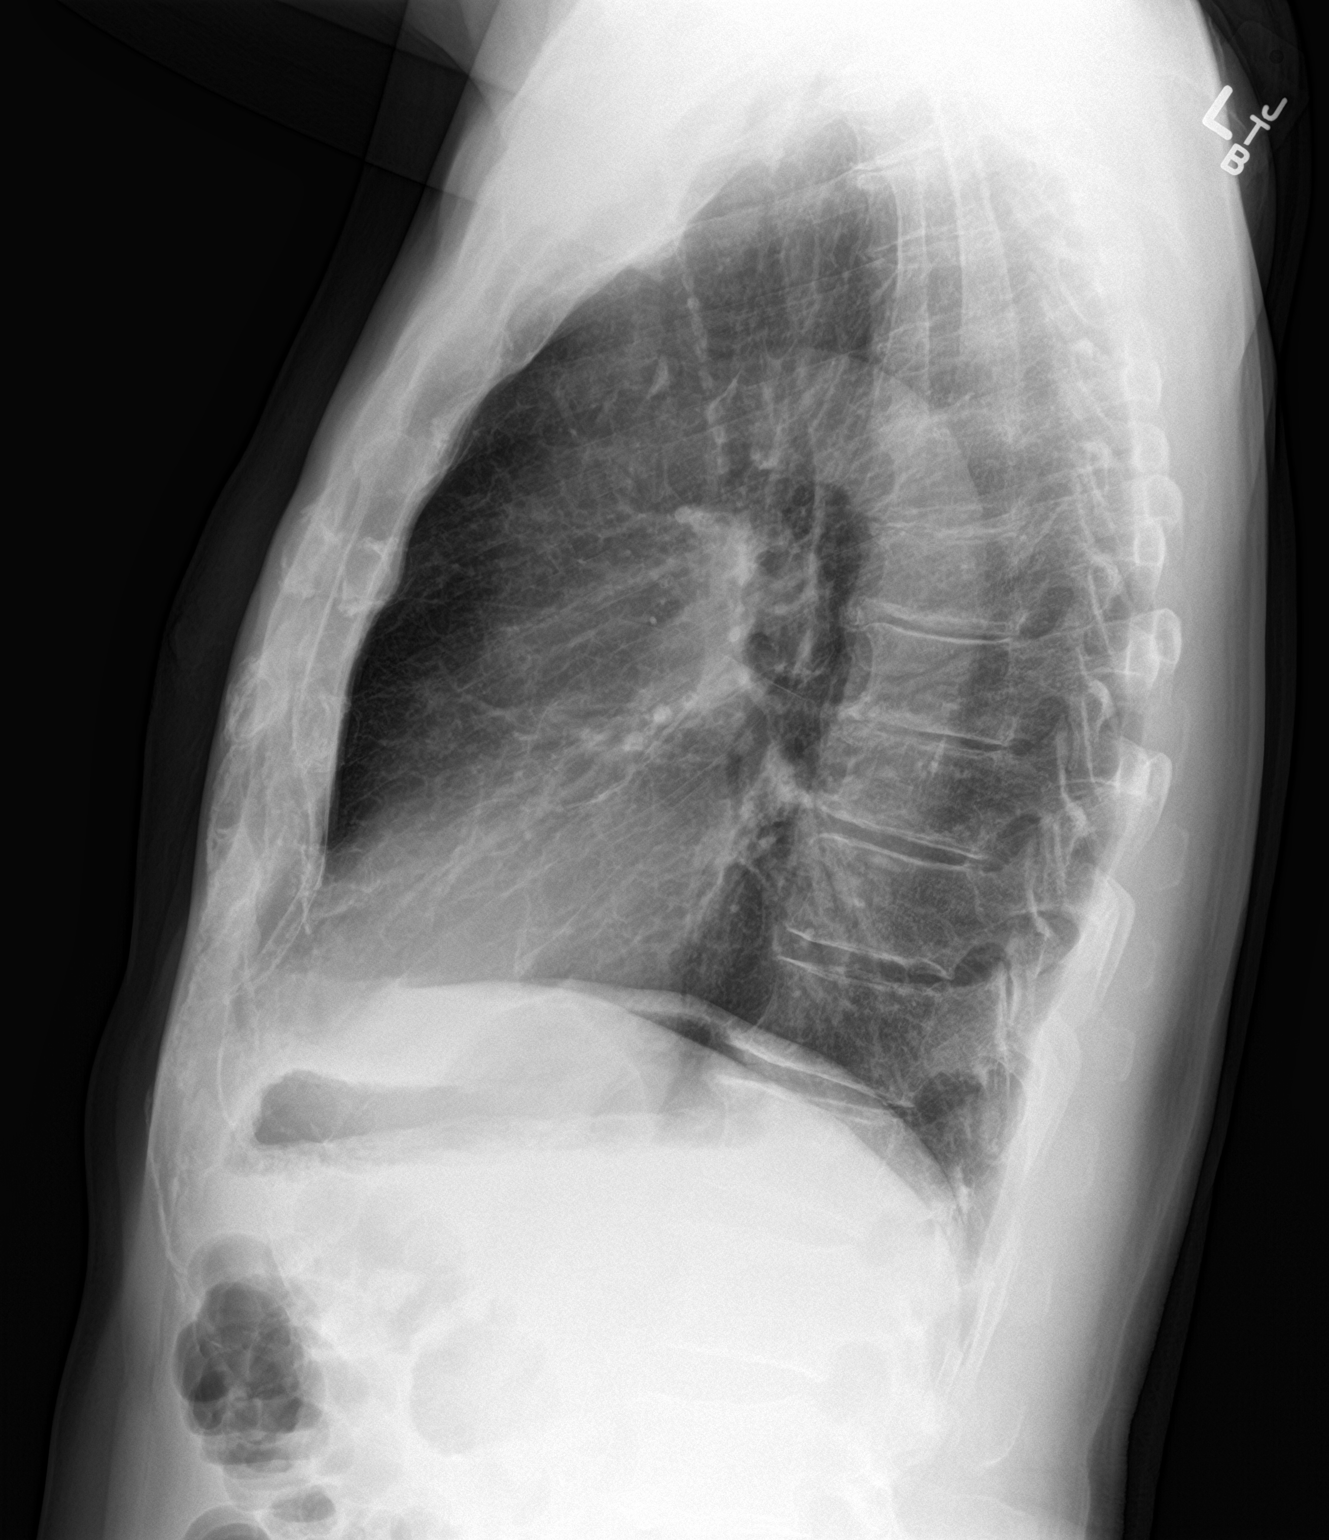

[2 of 2 positions shown; findings below may reference images not displayed]

FINDINGS: Mediastinum hilar structures normal. Heart size normal. No focal
infiltrate. Mild peribronchial cuffing again noted consistent with
bronchitis. Mild pleuroparenchymal thickening again noted consistent
scarring. No pleural effusion or pneumothorax. Degenerative changes
scoliosis thoracic spine.
IMPRESSION: Mild peribronchial cuffing again noted consistent with bronchitis.
Mild pleuroparenchymal thickening again noted consistent scarring.
No acute cardiopulmonary disease.

## 2020-09-08 MED ORDER — SODIUM CHLORIDE 0.9 % IV BOLUS
500.0000 mL | Freq: Once | INTRAVENOUS | Status: AC
  Administered 2020-09-08: 500 mL via INTRAVENOUS

## 2020-09-08 MED ORDER — IOHEXOL 300 MG/ML  SOLN
100.0000 mL | Freq: Once | INTRAMUSCULAR | Status: AC | PRN
  Administered 2020-09-08: 100 mL via INTRAVENOUS

## 2020-09-08 NOTE — ED Provider Notes (Signed)
[facility]    CSN: [id number] Arrival date & time: 09/08/20  0805      History   Chief Complaint Chief Complaint  Patient presents with  . Groin Pain    HPI Ronald Lowe is a 64 y.o. male.   Patient reports for 3-day history of painful groin mass.  He reports he has had a small mass in his groin for almost 25 years, states about the size of the tip of his finger.  He reports since Saturday after his cat jumped on his groin this mass is grown to the size of his thumb.  He reports that his 7 out of 10 in pain, worse with touch.  Reports he ran a fever of 100.5 yesterday, 100.2 this morning.  He has been taking Tylenol for the pain fever.  He reports he has had issues in his groin before, which would get sore after heavy lifting and working and typically goes away after a week.  He reports this is much more painful concerning that it has been before.  Denies nausea, vomiting, diarrhea.  Denies other symptoms today.  Urinating per usual.     Past Medical History:  Diagnosis Date  . Hypertension   . Tobacco abuse     Patient Active Problem List   Diagnosis Date Noted  . Positive hepatitis C antibody test 10/02/2019  . Tobacco dependence 09/24/2019  . Alcohol use disorder, mild, abuse 09/24/2019  . Orthostatic hypotension 06/12/2014  . EKG abnormality 06/12/2014  . HTN (hypertension) 06/12/2014    Past Surgical History:  Procedure Laterality Date  . TONSILLECTOMY         Home Medications    Prior to Admission medications   Medication Sig Start Date End Date Taking? Authorizing Provider  amLODipine (NORVASC) 5 MG tablet Take 1 tablet (5 mg total) by mouth every other day. Dx: I10 08/26/20   Nicolette Bang, DO  cyclobenzaprine (FLEXERIL) 10 MG tablet Take 1 tablet (10 mg total) by mouth 3 (three) times daily as needed for muscle spasms. 05/01/20   Nicolette Bang, DO  lisinopril (ZESTRIL) 5 MG tablet Take 0.5 tablets (2.5 mg total) by  mouth every other day. 01/21/20   Nicolette Bang, DO  meloxicam (MOBIC) 15 MG tablet Take 1 tablet (15 mg total) by mouth daily. 06/08/20   Nicolette Bang, DO  nicotine (NICODERM CQ - DOSED IN MG/24 HOURS) 21 mg/24hr patch Place 1 patch (21 mg total) onto the skin daily. 09/24/19   Ladell Pier, MD  triamcinolone cream (KENALOG) 0.1 % Apply 1 application topically 2 (two) times daily. 02/10/20   Nicolette Bang, DO    Family History Family History  Problem Relation Age of Onset  . Emphysema Father   . CAD Father        CABG  . Stroke Mother   . CAD Brother 108       CABG  . Hypertension Brother     Social History Social History   Tobacco Use  . Smoking status: Current Every Day Smoker    Packs/day: 0.50    Years: 40.00    Pack years: 20.00    Types: Cigarettes  . Smokeless tobacco: Never Used  Vaping Use  . Vaping Use: Never used  Substance Use Topics  . Alcohol use: Yes    Alcohol/week: 42.0 standard drinks    Types: 28 Cans of beer, 14 Shots of liquor per week  . Drug use: No  Allergies   Patient has no known allergies.   Review of Systems Review of Systems   Physical Exam Triage Vital Signs ED Triage Vitals  Enc Vitals Group     BP      Pulse      Resp      Temp      Temp src      SpO2      Weight      Height      Head Circumference      Peak Flow      Pain Score      Pain Loc      Pain Edu?      Excl. in [place]?    No data found.  Updated Vital Signs BP (!) 151/102 (BP Location: Right Arm)   Pulse (!) 108   Temp 100.2 F (37.9 C) (Oral)   Resp 16   SpO2 97%   Visual Acuity Right Eye Distance:   Left Eye Distance:   Bilateral Distance:    Right Eye Near:   Left Eye Near:    Bilateral Near:     Physical Exam Vitals and nursing note reviewed.  Constitutional:      Appearance: He is well-developed.  HENT:     Head: Normocephalic and atraumatic.  Eyes:     Conjunctiva/sclera: Conjunctivae normal.    Cardiovascular:     Rate and Rhythm: Regular rhythm. Tachycardia present.     Heart sounds: No murmur heard.   Pulmonary:     Effort: Pulmonary effort is normal. No respiratory distress.     Breath sounds: Normal breath sounds.  Abdominal:     Palpations: Abdomen is soft.     Tenderness: There is no abdominal tenderness.  Genitourinary:    Comments: Right groin with tender firm masses roughly the size of a thumb and visible swelling.  Masses feel deep in the tissues, does not feel superficial in the skin.  Exquisitely tender to palpation.  No overlying erythema or open wound.  No testicular swelling or appreciable mass in the scrotum.  Left groin without swelling. Musculoskeletal:     Cervical back: Neck supple.  Skin:    General: Skin is warm and dry.  Neurological:     Mental Status: He is alert.      UC Treatments / Results  Labs (all labs ordered are listed, but only abnormal results are displayed) Labs Reviewed - No data to display  EKG   Radiology No results found.  Procedures Procedures (including critical care time)  Medications Ordered in UC Medications - No data to display  Initial Impression / Assessment and Plan / UC Course  I have reviewed the triage vital signs and the nursing notes.  Pertinent labs & imaging results that were available during my care of the patient were reviewed by me and considered in my medical decision making (see chart for details).     #Groin mass #Groin pain #Fever Patient is a 64 year old presenting with painful groin mass, low-grade temperature elevated heart rate.  Concern would be for incarcerated hernia with infection, believe patient needs higher level evaluation for rule out of this with of his pelvis.  Discussed with patient given his fever, elevated heart rate and complaints today that he needs higher level evaluation in the emergency department.  Stressed the importance of being evaluated in the emergency department.   Patient verbalized agreement understanding plan of care Final Clinical Impressions(s) / UC Diagnoses   Final diagnoses:  Right groin mass  Fever, unspecified  Right inguinal pain     Discharge Instructions     You need higher level of evaluation in the Emergency Department today. Please report to [facility] Emergency Department following discharge from Urgent Care. It is important you have this fully evaluated today    ED Prescriptions    None     PDMP not reviewed this encounter.   Purnell Shoemaker, PA-C 09/08/20 [phone]

## 2020-09-08 NOTE — ED Triage Notes (Signed)
Pt states he has had a knot near his groin for 25 years that has not bothered him. He states his cat jumped onto his lap Saturday and landed on that spot. Pt states he started running a fever of 100.5 and he took tylenol. Pt states the pain is 7/10.

## 2020-09-08 NOTE — ED Triage Notes (Signed)
Onset 3 days ago patient sitting on couch and cat jump on right groin. States 30 years ago had a lump right groin and now increased in size since the cat jump on lap. Pain 6/10 dull sharp. States fever yesterday 100.5 F.

## 2020-09-08 NOTE — Discharge Instructions (Signed)
You need higher level of evaluation in the Emergency Department today. Please report to [facility] Emergency Department following discharge from Urgent Care. It is important you have this fully evaluated today

## 2020-09-08 NOTE — Telephone Encounter (Signed)
Received a new pt referral from Dr. Annamary Rummage from the Ed for renal cell carcinoma. Mr. Conover has been scheduled to see Dr. Alen Blew on 10/12 at 2pm. Letter mailed to the pt.

## 2020-09-08 NOTE — ED Provider Notes (Signed)
[facility] EMERGENCY DEPARTMENT Provider Note   CSN: [id number] Arrival date & time: 09/08/20  7209     History Chief Complaint  Patient presents with  . Groin Pain    Ronald Lowe is a 64 y.o. male.  HPI Patient reports he has a very tender swollen area in his right groin.  He advises he has had a small hernia there for over 20 years.  Occasionally, if he jumps in and out of a truck or lift something it will get slightly larger but then go back down again.  Is never been much larger than a few centimeters by his illustration.  He reports he was lying on the couch 3 days ago and the cat jumped directly onto his groin using both front and rear feet to launch off his groin.  It was very painful.  He reports since then, he has been extremely tender in that area.  He noticed that it was more swollen and almost the size of his thumb.  He has been resting on the couch and taking some Tylenol.  He reports last night it was hard for him to sleep because it was very painful.  No radiation of pain into the scrotum.  No pain into the leg.  No nausea or vomiting.  No abdominal pain.  Patient reports he did have a low-grade fever yesterday 100.5.  He reports he always has a slight cough due to smoking but he quit recently.  He has been immunized for Covid having gotten both immunizations.  Denies recent body aches, URI symptoms diarrhea or vomiting.  Patient recently married his long-term male partner of over 27 years.  He reports they are however not sexually active due to erectile dysfunction.  He has not sought professional evaluation for this.    Past Medical History:  Diagnosis Date  . Hypertension   . Tobacco abuse     Patient Active Problem List   Diagnosis Date Noted  . Positive hepatitis C antibody test 10/02/2019  . Tobacco dependence 09/24/2019  . Alcohol use disorder, mild, abuse 09/24/2019  . Orthostatic hypotension 06/12/2014  . EKG abnormality 06/12/2014  .  HTN (hypertension) 06/12/2014    Past Surgical History:  Procedure Laterality Date  . TONSILLECTOMY         Family History  Problem Relation Age of Onset  . Emphysema Father   . CAD Father        CABG  . Stroke Mother   . CAD Brother 59       CABG  . Hypertension Brother     Social History   Tobacco Use  . Smoking status: Current Every Day Smoker    Packs/day: 0.50    Years: 40.00    Pack years: 20.00    Types: Cigarettes  . Smokeless tobacco: Never Used  Vaping Use  . Vaping Use: Never used  Substance Use Topics  . Alcohol use: Yes    Alcohol/week: 42.0 standard drinks    Types: 28 Cans of beer, 14 Shots of liquor per week  . Drug use: No    Home Medications Prior to Admission medications   Medication Sig Start Date End Date Taking? Authorizing Provider  acetaminophen (TYLENOL) 500 MG tablet Take 1,000 mg by mouth every 6 (six) hours as needed for mild pain.   Yes [provider]  amLODipine (NORVASC) 5 MG tablet Take 1 tablet (5 mg total) by mouth every other day. Dx: [place] Patient taking  differently: Take 2.5 mg by mouth every other day. Dx: I10 08/26/20  Yes Nicolette Bang, DO  lisinopril (ZESTRIL) 5 MG tablet Take 0.5 tablets (2.5 mg total) by mouth every other day. 01/21/20  Yes Nicolette Bang, DO  cyclobenzaprine (FLEXERIL) 10 MG tablet Take 1 tablet (10 mg total) by mouth 3 (three) times daily as needed for muscle spasms. Patient not taking: Reported on 09/08/2020 05/01/20   Nicolette Bang, DO  meloxicam (MOBIC) 15 MG tablet Take 1 tablet (15 mg total) by mouth daily. Patient not taking: Reported on 09/08/2020 06/08/20   Nicolette Bang, DO  triamcinolone cream (KENALOG) 0.1 % Apply 1 application topically 2 (two) times daily. Patient not taking: Reported on 09/08/2020 02/10/20   Nicolette Bang, DO    Allergies    Patient has no known allergies.  Review of Systems   Review of Systems 10 systems  reviewed and negative except as per HPI Physical Exam Updated Vital Signs BP (!) 142/89 (BP Location: Right Arm)   Pulse (!) 109   Temp 98.8 F (37.1 C) (Oral)   Resp 16   Ht 6' (1.829 m)   Wt 79.4 kg   SpO2 100%   BMI 23.73 kg/m   Physical Exam Constitutional:      Comments: Alert and nontoxic.  Clinically well in appearance.  No respiratory distress.  HENT:     Head: Normocephalic and atraumatic.     Mouth/Throat:     Pharynx: Oropharynx is clear.  Eyes:     Extraocular Movements: Extraocular movements intact.     Conjunctiva/sclera: Conjunctivae normal.  Cardiovascular:     Comments: Borderline tachycardia.  Regular no rub murmur gallop. Pulmonary:     Effort: Pulmonary effort is normal.     Breath sounds: Normal breath sounds.  Abdominal:     General: There is no distension.     Palpations: Abdomen is soft.     Tenderness: There is no abdominal tenderness. There is no guarding.     Comments: In standing position, mild fullness of inguinal hernia in right groin.  This reduces easily with pressure.  No scrotal enlargement.  Smooth contours of right testicle, nontender.  Patient is very tender to palpation over the inguinal ligament on the right.  In supine position hernia spontaneously reduces.  No significant lymphadenopathy.  No lymphadenopathy in the left groin.  Left groin nontender.  Femoral pulses normal without any palpaple thrill or nodule enlargement of the vessel.  Penis normal in appearance without any rash erythema or drainage.  Musculoskeletal:        General: No swelling. Normal range of motion.     Right lower leg: No edema.     Left lower leg: No edema.  Skin:    General: Skin is warm and dry.  Neurological:     General: No focal deficit present.     Mental Status: He is oriented to person, place, and time.     Coordination: Coordination normal.  Psychiatric:        Mood and Affect: Mood normal.     ED Results / Procedures / Treatments   Labs (all  labs ordered are listed, but only abnormal results are displayed) Labs Reviewed  LACTIC ACID, PLASMA - Abnormal; Notable for the following components:      Result Value   Lactic Acid, Venous 2.4 (*)    All other components within normal limits  COMPREHENSIVE METABOLIC PANEL - Abnormal; Notable for the following  components:   Sodium 134 (*)    CO2 20 (*)    Glucose, Bld 143 (*)    Creatinine, Ser 1.46 (*)    GFR calc non Af Amer 50 (*)    GFR calc Af Amer 58 (*)    All other components within normal limits  CBC WITH DIFFERENTIAL/PLATELET - Abnormal; Notable for the following components:   MCV 103.0 (*)    MCH 34.8 (*)    All other components within normal limits  URINALYSIS, ROUTINE W REFLEX MICROSCOPIC - Abnormal; Notable for the following components:   Specific Gravity, Urine >1.046 (*)    Ketones, ur 5 (*)    All other components within normal limits  SARS CORONAVIRUS 2 BY RT PCR (HOSPITAL ORDER, [facility])  LACTIC ACID, PLASMA    EKG None  Radiology DG Chest 2 View  Result Date: 09/08/2020 CLINICAL DATA:  Cough. EXAM: CHEST - 2 VIEW COMPARISON:  04/20/2020. FINDINGS: Mediastinum hilar structures normal. Heart size normal. No focal infiltrate. Mild peribronchial cuffing again noted consistent with bronchitis. Mild pleuroparenchymal thickening again noted consistent scarring. No pleural effusion or pneumothorax. Degenerative changes scoliosis thoracic spine. IMPRESSION: Mild peribronchial cuffing again noted consistent with bronchitis. Mild pleuroparenchymal thickening again noted consistent scarring. No acute cardiopulmonary disease. Electronically Signed   By: Marcello Moores  Register   On: 09/08/2020 09:48   CT Abdomen Pelvis W Contrast  Result Date: 09/08/2020 CLINICAL DATA:  Right inguinal lymphadenopathy, fever EXAM: CT ABDOMEN AND PELVIS WITH CONTRAST TECHNIQUE: Multidetector CT imaging of the abdomen and pelvis was performed using the standard protocol  following bolus administration of intravenous contrast. CONTRAST:  148mL OMNIPAQUE IOHEXOL 300 MG/ML  SOLN COMPARISON:  Renal ultrasound 06/13/2014 FINDINGS: Lower chest: Lung bases are clear. No pulmonary nodules identified. Heart size is normal. Hepatobiliary: No focal liver lesion is identified. Unremarkable appearance of the gallbladder. No biliary dilatation. Pancreas: Unremarkable. No pancreatic ductal dilatation or surrounding inflammatory changes. Spleen: Normal in size without focal abnormality. Adrenals/Urinary Tract: No adrenal nodule. Heterogeneously enhancing solid mass within the posterolateral aspect of the mid to upper pole of the right kidney measuring 2.0 x 1.3 x 1.6 cm (series 3, image 36; series 6, image 60). Left kidney is unremarkable. No renal stone. No hydronephrosis. Right renal vein appears unremarkable. Urinary bladder unremarkable for the degree of distension. Stomach/Bowel: Stomach is within normal limits. No evidence of bowel wall thickening, distention, or inflammatory changes. Vascular/Lymphatic: Multiple enlarged right external iliac chain and right inguinal lymph nodes, largest within the right inguinal region measuring 2.2 cm short axis (series 3, image 82). Extensive calcified atherosclerotic plaque of the aortoiliac axis. No aneurysm. Reproductive: Prostate is unremarkable. Other: No free fluid. No abdominopelvic fluid collection. No pneumoperitoneum. No abdominal wall hernia. Musculoskeletal: No acute or significant osseous findings. 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 No suspicious bony lesion. IMPRESSION: 1. Heterogeneously enhancing 2.0 cm solid mass within the right kidney, highly suspicious for renal cell carcinoma. 2. Multiple enlarged right external iliac chain and right inguinal lymph nodes, largest within the right inguinal region measuring up to 2.2 cm. Findings are most suggestive of metastatic disease. A primary lymphoproliferative process  such as lymphoma is also a consideration. Aortic Atherosclerosis (ICD10-I70.0). Electronically Signed   By: Davina Poke D.O.   On: 09/08/2020 13:09    Procedures Procedures (including critical care time)  Medications Ordered in ED Medications  sodium chloride 0.9 % bolus 500 mL (0 mLs Intravenous Stopped 09/08/20 1335)  iohexol (OMNIPAQUE) 300 MG/ML solution 100 mL (100 mLs Intravenous Contrast Given 09/08/20 1249)  sodium chloride 0.9 % bolus 500 mL (0 mLs Intravenous Stopped 09/08/20 1527)    ED Course  I have reviewed the triage vital signs and the nursing notes.  Pertinent labs & imaging results that were available during my care of the patient were reviewed by me and considered in my medical decision making (see chart for details).    MDM Rules/Calculators/A&P                         Patient presents with findings most consistent with soft tissue contusion to inguinal ligament.  Palpation of the groin is normal except for tenderness along the inguinal ligament.  There is a small, easily reducible hernia.  No significant lymphadenopathy.  No scrotal swelling or rashes or skin lesions of the genitals.  No numbness weakness or pain radiating into the leg to suggest arterial injury.  Femoral pulse normal without thrill or palpable enlargement.  Patient does have low-grade fever 100.5 and mild tachycardia.  Unclear etiology for this.  This does not correlate well with the patient's history and physical exam.  Will obtain basic lab work, chest x-ray, urinalysis and Covid swab.  CT scan obtained shows a 2 cm right renal mass concerning for malignancy and right inguinal lymphadenopathy concerning for metastatic lymphadenopathy.  Patient was made aware of this finding.  He reported feeling much improved since having hydration.  He is comfortable in appearance and in no distress.  I advised the patient I was contacting oncology to make plans for disposition and ongoing treatment.  Patient made it  clear he did not intend to stay as an admitted patient in the hospital.  Advised that was probably reasonable but I did want to speak with oncology for ongoing planning.  There was delay in return contact from oncology after 2 pages.  Patient's nurse advised me that he removed his own IV and eloped.  I have messaged Dr. Julien Nordmann regarding this patient and the necessity for contact and follow-up.  Ambulatory referral has been placed for oncology.  Patient clearly understood the diagnosis and the need to establish a follow-up plan. Final Clinical Impression(s) / ED Diagnoses Final diagnoses:  Kidney neoplasm  Lymphadenopathy    Rx / DC Orders ED Discharge Orders         Ordered    Ambulatory referral to Oncology        09/08/20 1530           Charlesetta Shanks, MD 09/08/20 1538

## 2020-09-08 NOTE — ED Notes (Signed)
Pt transported to CT ?

## 2020-09-08 NOTE — ED Notes (Signed)
Cam to check on Pt and Pt was not there. IV was on bed along with pulse ox and BP cuff. Pt had been given new that he had cancer and was awaiting for further planning

## 2020-09-21 ENCOUNTER — Other Ambulatory Visit: Payer: Self-pay

## 2020-09-21 ENCOUNTER — Ambulatory Visit (INDEPENDENT_AMBULATORY_CARE_PROVIDER_SITE_OTHER): Payer: Medicare Other | Admitting: Internal Medicine

## 2020-09-21 ENCOUNTER — Encounter: Payer: Self-pay | Admitting: Internal Medicine

## 2020-09-21 VITALS — BP 158/94 | HR 82 | Temp 97.3°F | Resp 17 | Wt 149.0 lb

## 2020-09-21 DIAGNOSIS — N2889 Other specified disorders of kidney and ureter: Secondary | ICD-10-CM

## 2020-09-21 DIAGNOSIS — I10 Essential (primary) hypertension: Secondary | ICD-10-CM

## 2020-09-21 MED ORDER — LISINOPRIL 5 MG PO TABS: 2.5000 mg | ORAL_TABLET | Freq: Every day | ORAL | 0 refills | Status: DC

## 2020-09-21 MED ORDER — AMLODIPINE BESYLATE 5 MG PO TABS: 5.0000 mg | ORAL_TABLET | Freq: Every day | ORAL | 0 refills | Status: DC

## 2020-09-21 NOTE — Progress Notes (Signed)
  Subjective:    Ronald Lowe - 64 y.o. male MRN [id number]  Date of birth: [birth year]  HPI  Ronald Lowe is here for HTN f/u.   Chronic HTN Disease Monitoring:  Home BP Monitoring - Does not monitor  Chest pain- no  Dyspnea- no Headache - no  Medications: Amlodipine 5 mg, Lisinopril 2.5 mg  Compliance- no---reports taking every other to every third day Lightheadedness- no  Edema- no    Health Maintenance:  Health Maintenance Due  Topic Date Due  . COVID-19 Vaccine (1) Never done    -  reports that he has been smoking cigarettes. He has a 20.00 pack-year smoking history. He has never used smokeless tobacco. - Review of Systems: Per HPI. - Past Medical History: Patient Active Problem List   Diagnosis Date Noted  . Positive hepatitis C antibody test 10/02/2019  . Tobacco dependence 09/24/2019  . Alcohol use disorder, mild, abuse 09/24/2019  . Orthostatic hypotension 06/12/2014  . EKG abnormality 06/12/2014  . HTN (hypertension) 06/12/2014   - Medications: reviewed and updated   Objective:   Physical Exam BP (!) 158/94   Pulse 82   Temp (!) 97.3 F (36.3 C) (Temporal)   Resp 17   Wt 149 lb (67.6 kg)   SpO2 96%   BMI 20.21 kg/m  Physical Exam Constitutional:      General: He is not in acute distress.    Appearance: He is not diaphoretic.  HENT:     Head: Normocephalic and atraumatic.  Eyes:     Conjunctiva/sclera: Conjunctivae normal.  Cardiovascular:     Rate and Rhythm: Normal rate and regular rhythm.     Heart sounds: Normal heart sounds. No murmur heard.   Pulmonary:     Effort: Pulmonary effort is normal. No respiratory distress.     Breath sounds: Normal breath sounds.  Musculoskeletal:        General: Normal range of motion.  Skin:    General: Skin is warm and dry.  Neurological:     Mental Status: He is alert and oriented to person, place, and time.  Psychiatric:        Mood and Affect: Affect normal.        Judgment:  Judgment normal.            Assessment & Plan:   1. Essential hypertension BP elevated. Discussed would recommend daily compliance with medication for more optimal control; continue at current doses. Return within 2 months for follow up and further adjustment as needed. Asymptomatic.  Counseled on blood pressure goal of less than 130/80, low-sodium, DASH diet, medication compliance, 150 minutes of moderate intensity exercise per week. Discussed medication compliance, adverse effects. - amLODipine (NORVASC) 5 MG tablet; Take 1 tablet (5 mg total) by mouth daily. Dx: I10  Dispense: 90 tablet; Refill: 0 - lisinopril (ZESTRIL) 5 MG tablet; Take 0.5 tablets (2.5 mg total) by mouth daily.  Dispense: 90 tablet; Refill: 0  2. Renal mass, right Discussed imaging findings from ED visit with patient--- heterogeneously enhancing 2.0 cm solid mass of right kidney with multiple enlarged right inguinal lymph nodes concerning for neoplasm and potentially metastatic disease. Patient has an appointment with Oncology later this week to establish care. Discussed biopsy or nephrectomy for definitive diagnosis is likely course. Provided support.     Phill Myron, D.O. 09/21/2020, 11:19 AM Primary Care at [facility]

## 2020-09-29 ENCOUNTER — Other Ambulatory Visit: Payer: Self-pay

## 2020-09-29 ENCOUNTER — Inpatient Hospital Stay: Payer: Medicare Other | Attending: Oncology | Admitting: Oncology

## 2020-09-29 ENCOUNTER — Encounter (HOSPITAL_COMMUNITY): Payer: Self-pay | Admitting: Radiology

## 2020-09-29 VITALS — BP 85/73 | HR 78 | Temp 97.8°F | Resp 18 | Ht 72.0 in | Wt 155.1 lb

## 2020-09-29 DIAGNOSIS — F17210 Nicotine dependence, cigarettes, uncomplicated: Secondary | ICD-10-CM | POA: Insufficient documentation

## 2020-09-29 DIAGNOSIS — R590 Localized enlarged lymph nodes: Secondary | ICD-10-CM | POA: Diagnosis not present

## 2020-09-29 DIAGNOSIS — N2889 Other specified disorders of kidney and ureter: Secondary | ICD-10-CM | POA: Insufficient documentation

## 2020-09-29 NOTE — Progress Notes (Signed)
Ronald Lowe. Ronald Lowe Male, 64 y.o., 10/30/1956 MRN:  [id number] Phone:  [phone] Ronald Lowe) PCP:  Nicolette Bang, DO Primary Cvg:  Medicare/Medicare Part A And B Next Appt With Radiology ([facility]) 10/09/2020 at 1:00 PM  RE: [place] Core Lymph Node Biopsy Received: Today Suttle, Rosanne Ashing, MD  Garth Bigness D Approved for ultrasound guided right inguinal lymph node biopsy.   Dylan       Previous Messages   ----- Message -----  From: Garth Bigness D  Sent: 09/29/2020  2:11 PM EDT  To: [facility] Procedure Requests  Subject: [place] Core Lymph Node Biopsy             Procedure: [place] Core Biopsy Lymph Node   Reason: Inguinal lymphadenopathy   History: CT in computer   Provider:  Wyatt Portela   Provider Contact: [phone]

## 2020-09-29 NOTE — Progress Notes (Signed)
Reason for the request:    Renal neoplasm  HPI: I was asked by Dr. Juleen China  to evaluate Ronald Lowe for possible diagnosis of renal neoplasm.  He is a 64 year old man presented to the emergency department with pain in his right groin.  His evaluation at that time included a CT scan completed on 09/09/2019 one of the abdomen and pelvis.  The scan showed a 2.0 cm solid mass in the right kidney suspicious for neoplasm.  He has also noted to have multiple enlarged right external iliac chain and inguinal lymph nodes largest floating right inguinal node measuring 2.2 cm.  Clinically, he reports no major changes in his health.  He denies any weight loss or appetite changes.  Benign abdominal pain, hematochezia or melena.  Continues to smoke a pack every 2 days.   He does not report any headaches, blurry vision, syncope or seizures. Does not report any fevers, chills or sweats.  Does not report any cough, wheezing or hemoptysis.  Does not report any chest pain, palpitation, orthopnea or leg edema.  Does not report any nausea, vomiting or abdominal pain.  Does not report any constipation or diarrhea.  Does not report any skeletal complaints.    Does not report frequency, urgency or hematuria.  Does not report any skin rashes or lesions. Does not report any heat or cold intolerance.  Does not report any lymphadenopathy or petechiae.  Does not report any anxiety or depression.  Remaining review of systems is negative.    Past Medical History:  Diagnosis Date  . Hypertension   . Tobacco abuse   :  Past Surgical History:  Procedure Laterality Date  . TONSILLECTOMY    :   Current Outpatient Medications:  .  acetaminophen (TYLENOL) 500 MG tablet, Take 1,000 mg by mouth every 6 (six) hours as needed for mild pain., Disp: , Rfl:  .  amLODipine (NORVASC) 5 MG tablet, Take 1 tablet (5 mg total) by mouth daily. Dx: I10, Disp: 90 tablet, Rfl: 0 .  lisinopril (ZESTRIL) 5 MG tablet, Take 0.5 tablets (2.5 mg total) by  mouth daily., Disp: 90 tablet, Rfl: 0:  No Known Allergies:  Family History  Problem Relation Age of Onset  . Emphysema Father   . CAD Father        CABG  . Stroke Mother   . CAD Brother 50       CABG  . Hypertension Brother   :  Social History   Socioeconomic History  . Marital status: Married    Spouse name: Not on file  . Number of children: 2  . Years of education: Not on file  . Highest education level: Not on file  Occupational History  . Occupation: retired  Tobacco Use  . Smoking status: Current Every Day Smoker    Packs/day: 0.50    Years: 40.00    Pack years: 20.00    Types: Cigarettes  . Smokeless tobacco: Never Used  Vaping Use  . Vaping Use: Never used  Substance and Sexual Activity  . Alcohol use: Yes    Alcohol/week: 42.0 standard drinks    Types: 28 Cans of beer, 14 Shots of liquor per week  . Drug use: No  . Sexual activity: Yes    Partners: Female    Birth control/protection: None  Other Topics Concern  . Not on file  Social History Narrative   Lives with girlfriend.     Social Determinants of Radio broadcast assistant  Strain:   . Difficulty of Paying Living Expenses: Not on file  Food Insecurity:   . Worried About Charity fundraiser in the Last Year: Not on file  . Ran Out of Food in the Last Year: Not on file  Transportation Needs:   . Lack of Transportation (Medical): Not on file  . Lack of Transportation (Non-Medical): Not on file  Physical Activity:   . Days of Exercise per Week: Not on file  . Minutes of Exercise per Session: Not on file  Stress:   . Feeling of Stress : Not on file  Social Connections:   . Frequency of Communication with Friends and Family: Not on file  . Frequency of Social Gatherings with Friends and Family: Not on file  . Attends Religious Services: Not on file  . Active Member of Clubs or Organizations: Not on file  . Attends Archivist Meetings: Not on file  . Marital Status: Not on file   Intimate Partner Violence:   . Fear of Current or Ex-Partner: Not on file  . Emotionally Abused: Not on file  . Physically Abused: Not on file  . Sexually Abused: Not on file  :  Pertinent items are noted in HPI.  Exam: Blood pressure (!) 85/73, pulse 78, temperature 97.8 F (36.6 C), temperature source Tympanic, resp. rate 18, height 6' (1.829 m), weight 155 lb 1.6 oz (70.4 kg), SpO2 100 %.   ECOG 1  General appearance: alert and cooperative appeared without distress. Head: atraumatic without any abnormalities. Eyes: conjunctivae/corneas clear. PERRL.  Sclera anicteric. Throat: lips, mucosa, and tongue normal; without oral thrush or ulcers. Resp: clear to auscultation bilaterally without rhonchi, wheezes or dullness to percussion. Cardio: regular rate and rhythm, S1, S2 normal, no murmur, click, rub or gallop GI: soft, non-tender; bowel sounds normal; no masses,  no organomegaly Skin: Skin color, texture, turgor normal. No rashes or lesions Lymph nodes: Cervical, supraclavicular, and axillary nodes normal. Neurologic: Grossly normal without any motor, sensory or deep tendon reflexes. Musculoskeletal: No joint deformity or effusion.    DG Chest 2 View  Result Date: 09/08/2020 CLINICAL DATA:  Cough. EXAM: CHEST - 2 VIEW COMPARISON:  04/20/2020. FINDINGS: Mediastinum hilar structures normal. Heart size normal. No focal infiltrate. Mild peribronchial cuffing again noted consistent with bronchitis. Mild pleuroparenchymal thickening again noted consistent scarring. No pleural effusion or pneumothorax. Degenerative changes scoliosis thoracic spine. IMPRESSION: Mild peribronchial cuffing again noted consistent with bronchitis. Mild pleuroparenchymal thickening again noted consistent scarring. No acute cardiopulmonary disease. Electronically Signed   By: Marcello Moores  Register   On: 09/08/2020 09:48   CT Abdomen Pelvis W Contrast  Result Date: 09/08/2020 CLINICAL DATA:  Right inguinal  lymphadenopathy, fever EXAM: CT ABDOMEN AND PELVIS WITH CONTRAST TECHNIQUE: Multidetector CT imaging of the abdomen and pelvis was performed using the standard protocol following bolus administration of intravenous contrast. CONTRAST:  123mL OMNIPAQUE IOHEXOL 300 MG/ML  SOLN COMPARISON:  Renal ultrasound 06/13/2014 FINDINGS: Lower chest: Lung bases are clear. No pulmonary nodules identified. Heart size is normal. Hepatobiliary: No focal liver lesion is identified. Unremarkable appearance of the gallbladder. No biliary dilatation. Pancreas: Unremarkable. No pancreatic ductal dilatation or surrounding inflammatory changes. Spleen: Normal in size without focal abnormality. Adrenals/Urinary Tract: No adrenal nodule. Heterogeneously enhancing solid mass within the posterolateral aspect of the mid to upper pole of the right kidney measuring 2.0 x 1.3 x 1.6 cm (series 3, image 36; series 6, image 60). Left kidney is unremarkable. No renal stone. No  hydronephrosis. Right renal vein appears unremarkable. Urinary bladder unremarkable for the degree of distension. Stomach/Bowel: Stomach is within normal limits. No evidence of bowel wall thickening, distention, or inflammatory changes. Vascular/Lymphatic: Multiple enlarged right external iliac chain and right inguinal lymph nodes, largest within the right inguinal region measuring 2.2 cm short axis (series 3, image 82). Extensive calcified atherosclerotic plaque of the aortoiliac axis. No aneurysm. Reproductive: Prostate is unremarkable. Other: No free fluid. No abdominopelvic fluid collection. No pneumoperitoneum. No abdominal wall hernia. Musculoskeletal: No acute or significant osseous findings. 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 No suspicious bony lesion. IMPRESSION: 1. Heterogeneously enhancing 2.0 cm solid mass within the right kidney, highly suspicious for renal cell carcinoma. 2. Multiple enlarged right external iliac chain and right inguinal  lymph nodes, largest within the right inguinal region measuring up to 2.2 cm. Findings are most suggestive of metastatic disease. A primary lymphoproliferative process such as lymphoma is also a consideration. Aortic Atherosclerosis (ICD10-I70.0). Electronically Signed   By: Davina Poke D.O.   On: 09/08/2020 13:09    Assessment and Plan:   64 year old with:  1.  Right renal mass noted on a CT scan on September 08, 2020.  Mass measuring 2.0 x 1.3 and 1.6 cm.  He has also lymphadenopathy noted including right inguinal as well as iliac chain lymph nodes.  These findings were reviewed and discussed with the patient.  Differential diagnosis was also reiterated.  Primary kidney neoplasm appears to be very likely with metastatic disease to the lymph nodes appear to be a possibility.  A 2nd primary in addition to his kidney neoplasm such as lymphoproliferative disorder would be a possibility but less likely.  Laboratory data obtained on 09/08/2020 showed a normal CBC except for elevated MCV and no other abnormalities to suggest lymphoma.  From a management standpoint, I recommended obtaining tissue biopsy to confirm the diagnosis.  I favor obtaining biopsy of his inguinal lymph node to determine whether he has indeed metastatic kidney cancer.  Based on these findings we can determine best course of action which include systemic therapy, nephrectomy or combination of the above.  We will make the appropriate referral for interventional radiology for a biopsy and will discuss results after that.  2.  Follow-up: We will be in the next few weeks after obtaining tissue biopsy.  60  minutes were dedicated to this visit. The time was spent on reviewing laboratory data, imaging studies, discussing treatment options, discussing differential diagnosis and answering questions regarding future plan.     A copy of this consult has been forwarded to the requesting physician.

## 2020-10-02 ENCOUNTER — Telehealth: Payer: Self-pay | Admitting: Oncology

## 2020-10-02 NOTE — Telephone Encounter (Signed)
Scheduled per los, patient has been called and notified. 

## 2020-10-08 ENCOUNTER — Other Ambulatory Visit: Payer: Self-pay | Admitting: Student

## 2020-10-09 ENCOUNTER — Other Ambulatory Visit: Payer: Self-pay

## 2020-10-09 ENCOUNTER — Ambulatory Visit (HOSPITAL_COMMUNITY)
Admission: RE | Admit: 2020-10-09 | Discharge: 2020-10-09 | Disposition: A | Discharge status: Home / Self Care | Payer: Medicare Other | Source: Ambulatory Visit | Attending: Oncology | Admitting: Oncology

## 2020-10-09 ENCOUNTER — Encounter (HOSPITAL_COMMUNITY): Payer: Self-pay

## 2020-10-09 DIAGNOSIS — F17210 Nicotine dependence, cigarettes, uncomplicated: Secondary | ICD-10-CM | POA: Insufficient documentation

## 2020-10-09 DIAGNOSIS — D34 Benign neoplasm of thyroid gland: Secondary | ICD-10-CM | POA: Insufficient documentation

## 2020-10-09 DIAGNOSIS — R590 Localized enlarged lymph nodes: Secondary | ICD-10-CM

## 2020-10-09 IMAGING — US US BIOPSY LYMPH NODE
1 series · 12 of 12 positions shown · non-contrast
Comparison: none

INDICATION: 64-year-old male with right inguinal lymphadenopathy and right renal
mass.

[Series 1: us biopsy lymph node · 12 of 12 slices shown]
[im 1/12]
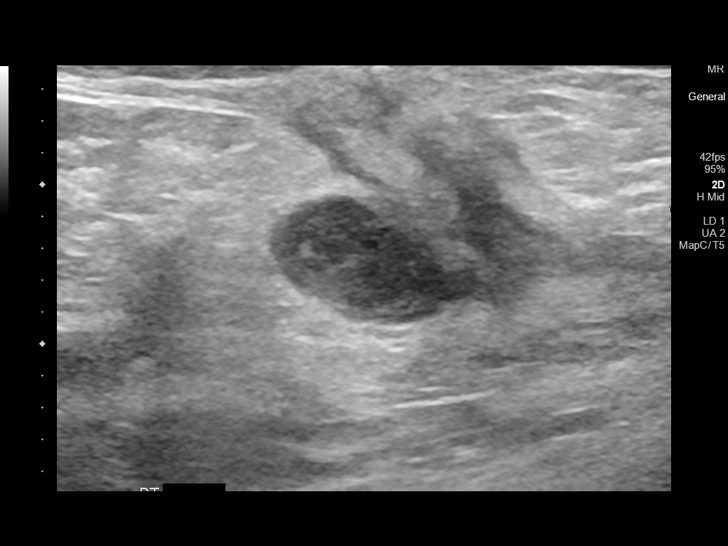
[im 2/12]
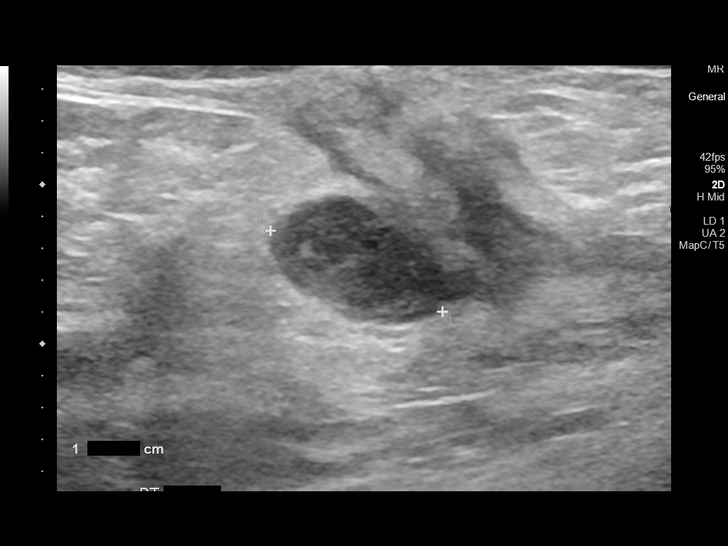
[im 3/12]
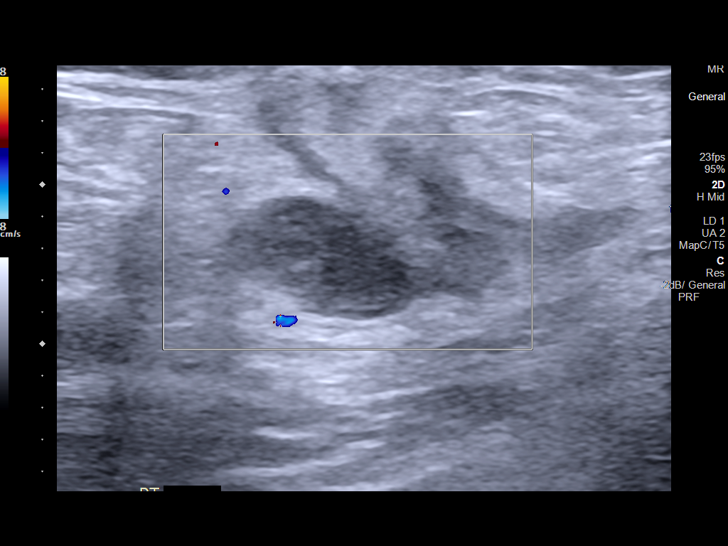
[im 4/12]
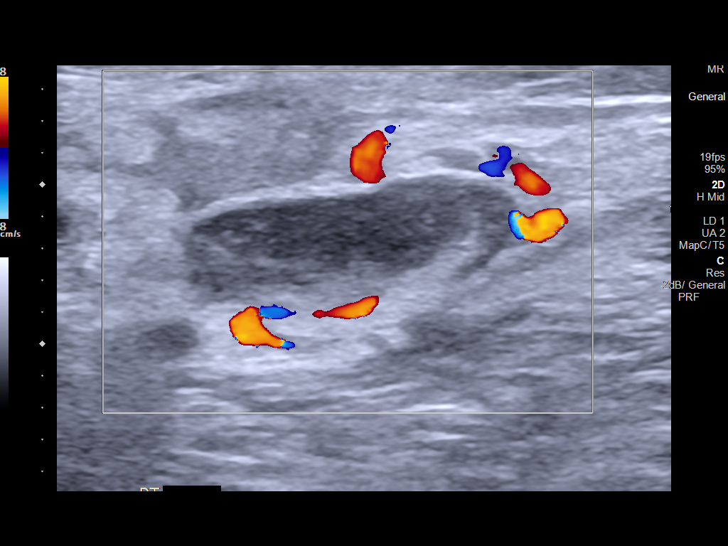
[im 5/12]
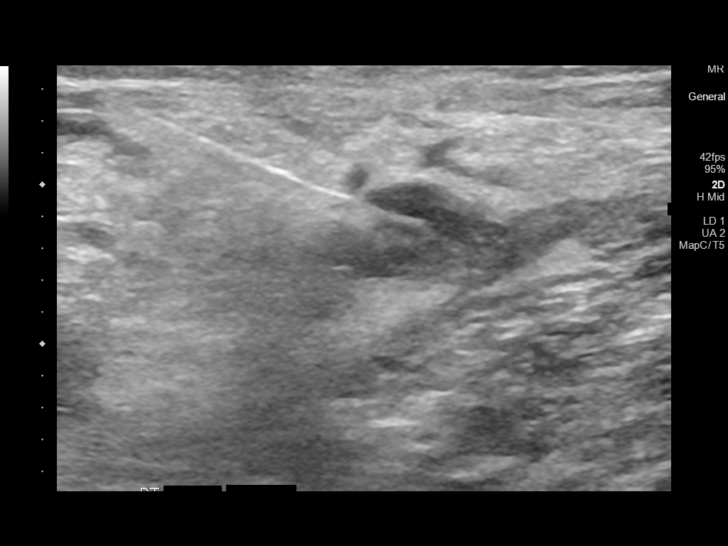
[im 6/12]
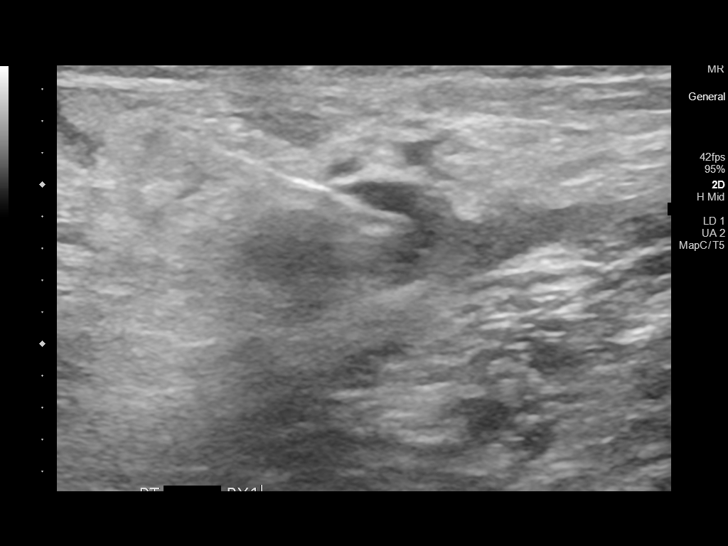
[im 7/12]
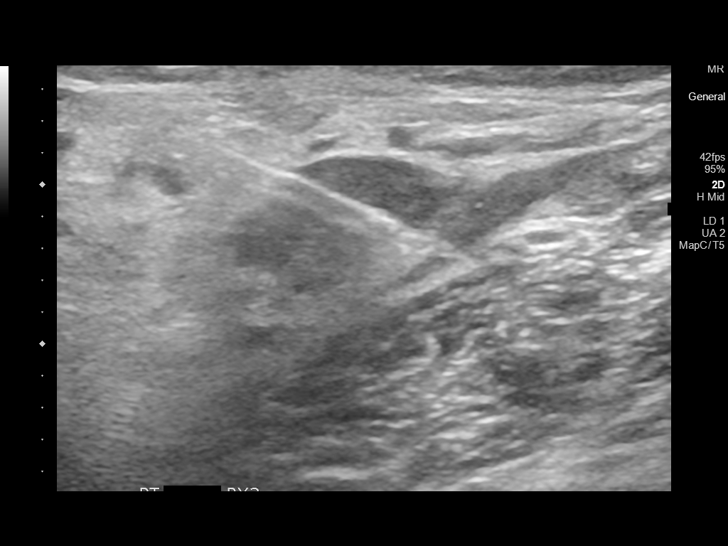
[im 8/12]
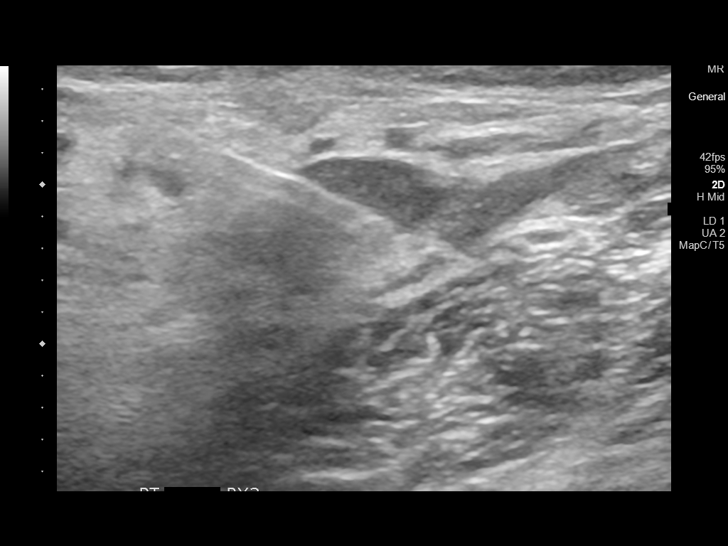
[im 9/12]
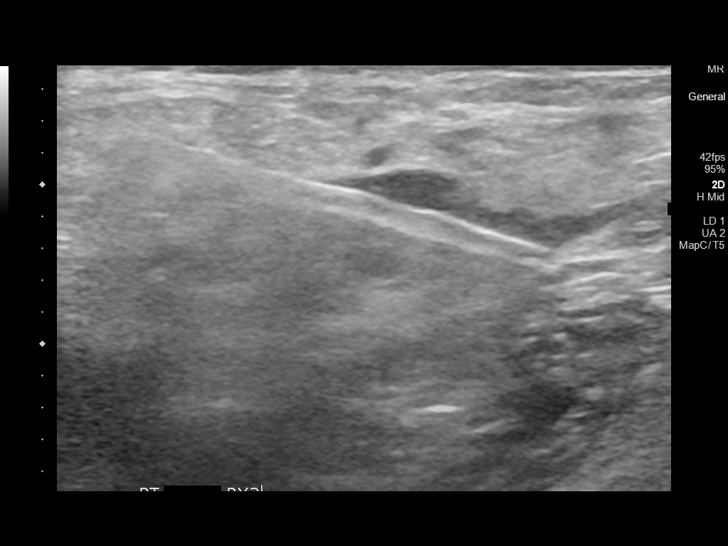
[im 10/12]
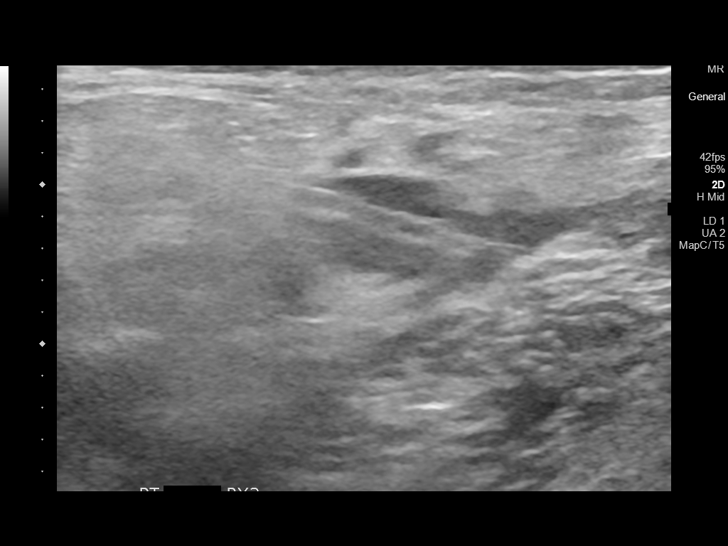
[im 11/12]
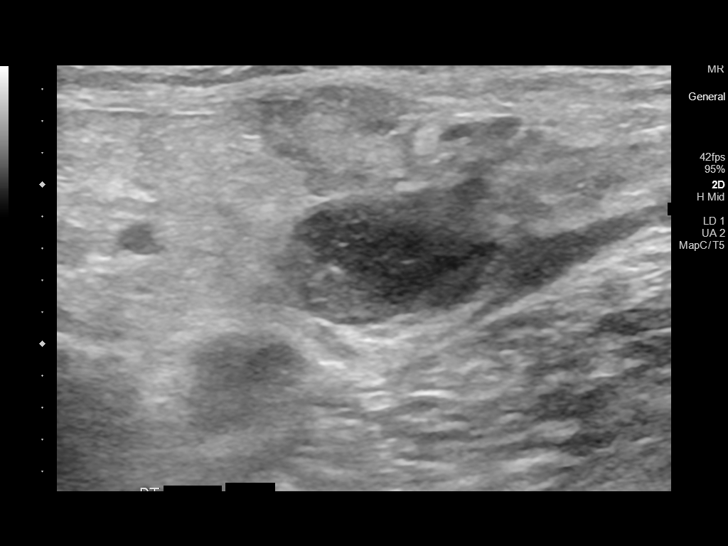
[im 12/12]
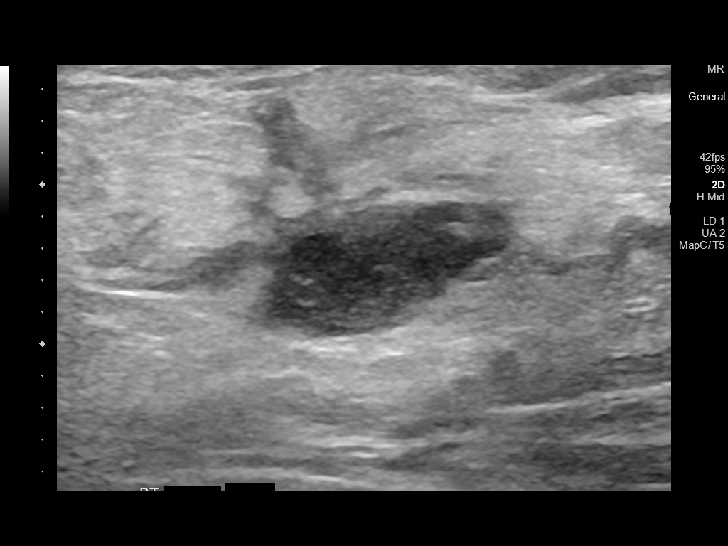

[12 of 12 positions shown; findings below may reference images not displayed]

EXAM:
ULTRASOUND GUIDED core BIOPSY OF right inguinal lymph node

MEDICATIONS:
None.

ANESTHESIA/SEDATION:
Fentanyl 100 mcg IV; Versed 2 mg IV

Moderate Sedation Time:  15

The patient was continuously monitored during the procedure by the
interventional radiology nurse under my direct supervision.

PROCEDURE:
The procedure, risks, benefits, and alternatives were explained to
the patient. Questions regarding the procedure were encouraged and
answered. The patient understands and consents to the procedure.

The right groin was prepped with chlorhexidine in a sterile fashion,
and a sterile drape was applied covering the operative field. A
sterile gown and sterile gloves were used for the procedure. Local
anesthesia was provided with 1% Lidocaine.

Ultrasound evaluation demonstrated a hypoechoic irregular appearing
lymph node in the right groin compatible with findings on recent CT
abdomen pelvis. The procedure was planned. Local anesthesia was
provided subdermally with 1% lidocaine as well as deeper along the
planned biopsy entrance of the lymph node. Small skin nick was made.
Under direct ultrasound guidance, an 18 gauge biopsy device was
directed to the periphery of the lesion. A total of 4 core biopsies
were obtained and placed in formalin prior to sending to Pathology.
Postprocedure ultrasound images demonstrated no evidence of
surrounding hematoma. Hemostasis was obtained with manual
compression. A bandage was applied. The patient tolerated the
procedure well was transferred to the recovery area in stable
condition.

COMPLICATIONS:
None immediate.
FINDINGS: Abnormal right inguinal lymph node.
IMPRESSION: Successful core biopsy of right inguinal lymph node.

## 2020-10-09 MED ORDER — MIDAZOLAM HCL 2 MG/2ML IJ SOLN
INTRAMUSCULAR | Status: AC
  Filled 2020-10-09: qty 4

## 2020-10-09 MED ORDER — FENTANYL CITRATE (PF) 100 MCG/2ML IJ SOLN
INTRAMUSCULAR | Status: AC | PRN
Start: 2020-10-09 — End: 2020-10-09
  Administered 2020-10-09 (×2): 50 ug via INTRAVENOUS

## 2020-10-09 MED ORDER — SODIUM CHLORIDE 0.9 % IV SOLN: INTRAVENOUS | Status: DC

## 2020-10-09 MED ORDER — MIDAZOLAM HCL 2 MG/2ML IJ SOLN
INTRAMUSCULAR | Status: AC | PRN
  Administered 2020-10-09 (×2): 1 mg via INTRAVENOUS

## 2020-10-09 MED ORDER — LIDOCAINE HCL 1 % IJ SOLN
INTRAMUSCULAR | Status: AC
  Filled 2020-10-09: qty 20

## 2020-10-09 MED ORDER — FENTANYL CITRATE (PF) 100 MCG/2ML IJ SOLN
INTRAMUSCULAR | Status: DC
Start: 2020-10-09 — End: 2020-10-10
  Filled 2020-10-09: qty 2

## 2020-10-09 NOTE — Procedures (Signed)
Interventional Radiology Procedure Note  Procedure: Ultrasound guided right inguinal lymph node biopsy  Findings: Please refer to procedural dictation for full description. 18 ga core lymph node biopsy x4.  Samples placed in formalin and sent to Pathology.  Complications: None immediate.  Estimated Blood Loss: <5 mL  Recommendations: Follow up Pathology results.   Ruthann Cancer, MD

## 2020-10-09 NOTE — Consult Note (Signed)
Chief Complaint: Patient was seen in consultation today for image guided biopsy of right inguinal lymph node  Referring Physician(s): Wyatt Portela  Supervising Physician: Gretta Cool  Patient Status: [facility] - Out-pt  History of Present Illness: Ronald Lowe is a 64 y.o. male smoker with history of weight loss, right groin discomfort and subsequent imaging which revealed:  1. Heterogeneously enhancing 2.0 cm solid mass within the right kidney, highly suspicious for renal cell carcinoma. 2. Multiple enlarged right external iliac chain and right inguinal lymph nodes, largest within the right inguinal region measuring up to 2.2 cm. Findings are most suggestive of metastatic disease. A primary lymphoproliferative process such as lymphoma is also a consideration.  Aortic Atherosclerosis   He has no reported history of malignancy.  He presents today for image guided right inguinal lymph node biopsy for further evaluation.  Past Medical History:  Diagnosis Date  . Hypertension   . Tobacco abuse     Past Surgical History:  Procedure Laterality Date  . TONSILLECTOMY      Allergies: Patient has no known allergies.  Medications: Prior to Admission medications   Medication Sig Start Date End Date Taking? Authorizing Provider  acetaminophen (TYLENOL) 500 MG tablet Take 1,000 mg by mouth every 6 (six) hours as needed for mild pain.   Yes [provider]  amLODipine (NORVASC) 5 MG tablet Take 1 tablet (5 mg total) by mouth daily. Dx: I10 09/21/20  Yes Nicolette Bang, DO  lisinopril (ZESTRIL) 5 MG tablet Take 0.5 tablets (2.5 mg total) by mouth daily. 09/21/20  Yes Nicolette Bang, DO     Family History  Problem Relation Age of Onset  . Emphysema Father   . CAD Father        CABG  . Stroke Mother   . CAD Brother 44       CABG  . Hypertension Brother     Social History   Socioeconomic History  . Marital status: Married    Spouse name: Not  on file  . Number of children: 2  . Years of education: Not on file  . Highest education level: Not on file  Occupational History  . Occupation: retired  Tobacco Use  . Smoking status: Current Every Day Smoker    Packs/day: 0.50    Years: 40.00    Pack years: 20.00    Types: Cigarettes  . Smokeless tobacco: Never Used  Vaping Use  . Vaping Use: Never used  Substance and Sexual Activity  . Alcohol use: Yes    Alcohol/week: 42.0 standard drinks    Types: 28 Cans of beer, 14 Shots of liquor per week  . Drug use: No  . Sexual activity: Yes    Partners: Female    Birth control/protection: None  Other Topics Concern  . Not on file  Social History Narrative   Lives with girlfriend.     Social Determinants of Health   Financial Resource Strain:   . Difficulty of Paying Living Expenses: Not on file  Food Insecurity:   . Worried About Charity fundraiser in the Last Year: Not on file  . Ran Out of Food in the Last Year: Not on file  Transportation Needs:   . Lack of Transportation (Medical): Not on file  . Lack of Transportation (Non-Medical): Not on file  Physical Activity:   . Days of Exercise per Week: Not on file  . Minutes of Exercise per Session: Not on file  Stress:   .  Feeling of Stress : Not on file  Social Connections:   . Frequency of Communication with Friends and Family: Not on file  . Frequency of Social Gatherings with Friends and Family: Not on file  . Attends Religious Services: Not on file  . Active Member of Clubs or Organizations: Not on file  . Attends Archivist Meetings: Not on file  . Marital Status: Not on file      Review of Systems : denies fever, headache, chest pain, worsening dyspnea, cough, abdominal pain, back pain, nausea, vomiting or bleeding.  Does have some right groin discomfort: has had wt loss   Vital Signs: BP (!) 153/99 (BP Location: Right Arm)   Pulse 73   Temp 98.4 F (36.9 C) (Oral)   Resp 18   SpO2 99%    Physical Exam awake, alert.  Chest with distant breath sounds bilaterally.  Heart with regular rate and rhythm.  Abdomen soft, positive bowel sounds, nontender.  Right inguinal adenopathy noted,tender to palpation.  No lower extremity edema.  Imaging: No results found.  Labs:  CBC: Recent Labs    09/08/20 0910  WBC 8.1  HGB 16.2  HCT 47.9  PLT 231    COAGS: No results for input(s): INR, APTT in the last 8760 hours.  BMP: Recent Labs    09/08/20 0910  NA 134*  K 4.0  CL 99  CO2 20*  GLUCOSE 143*  BUN 10  CALCIUM 9.0  CREATININE 1.46*  GFRNONAA 50*  GFRAA 58*    LIVER FUNCTION TESTS: Recent Labs    09/08/20 0910  BILITOT 1.1  AST 24  ALT 17  ALKPHOS 51  PROT 7.2  ALBUMIN 3.9    TUMOR MARKERS: No results for input(s): AFPTM, CEA, CA199, CHROMGRNA in the last 8760 hours.  Assessment and Plan: 65 y.o. male smoker with history of weight loss, right groin discomfort and subsequent imaging which revealed:  1. Heterogeneously enhancing 2.0 cm solid mass within the right kidney, highly suspicious for renal cell carcinoma. 2. Multiple enlarged right external iliac chain and right inguinal lymph nodes, largest within the right inguinal region measuring up to 2.2 cm. Findings are most suggestive of metastatic disease. A primary lymphoproliferative process such as lymphoma is also a consideration.  Aortic Atherosclerosis   He has no reported history of malignancy.  He presents today for image guided right inguinal lymph node biopsy for further evaluation.Risks and benefits of procedure was discussed with the patient  including, but not limited to bleeding, infection, damage to adjacent structures or low yield requiring additional tests.  All of the questions were answered and there is agreement to proceed.  Consent signed and in chart.    Thank you for this interesting consult.  I greatly enjoyed meeting Ronald Lowe and look forward to participating  in their care.  A copy of this report was sent to the requesting provider on this date.  Electronically Signed: D. Rowe Robert, PA-C 10/09/2020, 12:38 PM   I spent a total of 25 minutes in face to face in clinical consultation, greater than 50% of which was counseling/coordinating care for image guided right inguinal lymph node biopsy

## 2020-10-09 NOTE — Discharge Instructions (Signed)
Please call Interventional Radiology clinic [phone] with any questions or concerns.  You may remove your dressing and shower tomorrow.   Needle Biopsy, Care After These instructions tell you how to care for yourself after your procedure. Your doctor may also give you more specific instructions. Call your doctor if you have any problems or questions. What can I expect after the procedure? After the procedure, it is common to have:  Soreness.  Bruising.  Mild pain. Follow these instructions at home:   Return to your normal activities as told by your doctor. Ask your doctor what activities are safe for you.  Take over-the-counter and prescription medicines only as told by your doctor.  Wash your hands with soap and water before you change your bandage (dressing). If you cannot use soap and water, use hand sanitizer.  Follow instructions from your doctor about: ? How to take care of your puncture site. ? When and how to change your bandage. ? When to remove your bandage.  Check your puncture site every day for signs of infection. Watch for: ? Redness, swelling, or pain. ? Fluid or blood. ? Pus or a bad smell. ? Warmth.  Do not take baths, swim, or use a hot tub until your doctor approves. Ask your doctor if you may take showers. You may only be allowed to take sponge baths.  Keep all follow-up visits as told by your doctor. This is important. Contact a doctor if you have:  A fever.  Redness, swelling, or pain at the puncture site, and it lasts longer than a few days.  Fluid, blood, or pus coming from the puncture site.  Warmth coming from the puncture site. Get help right away if:  You have a lot of bleeding from the puncture site. Summary  After the procedure, it is common to have soreness, bruising, or mild pain at the puncture site.  Check your puncture site every day for signs of infection, such as redness, swelling, or pain.  Get help right away if you  have severe bleeding from your puncture site. This information is not intended to replace advice given to you by your health care provider. Make sure you discuss any questions you have with your health care provider. Document Revised: 12/18/2017 Document Reviewed: 12/18/2017 Elsevier Patient Education  2020 [employer]   Moderate Conscious Sedation, Adult, Care After These instructions provide you with information about caring for yourself after your procedure. Your health care provider may also give you more specific instructions. Your treatment has been planned according to current medical practices, but problems sometimes occur. Call your health care provider if you have any problems or questions after your procedure. What can I expect after the procedure? After your procedure, it is common:  To feel sleepy for several hours.  To feel clumsy and have poor balance for several hours.  To have poor judgment for several hours.  To vomit if you eat too soon. Follow these instructions at home: For at least 24 hours after the procedure:   Do not: ? Participate in activities where you could fall or become injured. ? Drive. ? Use heavy machinery. ? Drink alcohol. ? Take sleeping pills or medicines that cause drowsiness. ? Make important decisions or sign legal documents. ? Take care of children on your own.  Rest. Eating and drinking  Follow the diet recommended by your health care provider.  If you vomit: ? Drink water, juice, or soup when you can drink without vomiting. ?   Make sure you have little or no nausea before eating solid foods. General instructions  Have a responsible adult stay with you until you are awake and alert.  Take over-the-counter and prescription medicines only as told by your health care provider.  If you smoke, do not smoke without supervision.  Keep all follow-up visits as told by your health care provider. This is important. Contact a health care  provider if:  You keep feeling nauseous or you keep vomiting.  You feel light-headed.  You develop a rash.  You have a fever. Get help right away if:  You have trouble breathing. This information is not intended to replace advice given to you by your health care provider. Make sure you discuss any questions you have with your health care provider. Document Revised: 11/17/2017 Document Reviewed: 03/26/2016 Elsevier Patient Education  2020 [employer].   

## 2020-10-13 LAB — SURGICAL PATHOLOGY

## 2020-10-15 ENCOUNTER — Encounter: Payer: Self-pay | Admitting: Physician Assistant

## 2020-10-15 ENCOUNTER — Other Ambulatory Visit: Payer: Self-pay

## 2020-10-15 ENCOUNTER — Inpatient Hospital Stay (HOSPITAL_BASED_OUTPATIENT_CLINIC_OR_DEPARTMENT_OTHER): Payer: Medicare Other | Admitting: Oncology

## 2020-10-15 ENCOUNTER — Telehealth (INDEPENDENT_AMBULATORY_CARE_PROVIDER_SITE_OTHER): Payer: Medicare Other | Admitting: Physician Assistant

## 2020-10-15 VITALS — BP 150/100 | HR 93 | Temp 97.4°F | Resp 18 | Wt 155.2 lb

## 2020-10-15 DIAGNOSIS — N2889 Other specified disorders of kidney and ureter: Secondary | ICD-10-CM

## 2020-10-15 DIAGNOSIS — R590 Localized enlarged lymph nodes: Secondary | ICD-10-CM

## 2020-10-15 DIAGNOSIS — K219 Gastro-esophageal reflux disease without esophagitis: Secondary | ICD-10-CM

## 2020-10-15 MED ORDER — PANTOPRAZOLE SODIUM 40 MG PO TBEC: 40.0000 mg | DELAYED_RELEASE_TABLET | Freq: Every day | ORAL | 1 refills | Status: DC

## 2020-10-15 NOTE — Progress Notes (Signed)
Established Patient Office Visit  Subjective:  Patient ID: Ronald Lowe, male    DOB: [birth year]  Age: 64 y.o. MRN: [id number]  CC:  Chief Complaint  Patient presents with  . Heartburn    Virtual Visit via Telephone Note  I connected with Ronald Lowe on 10/15/20 at  2:50 PM EDT by telephone and verified that I am speaking with the correct person using two identifiers.  Location: Patient: Home Provider: Primary Care at [facility]   I discussed the limitations, risks, security and privacy concerns of performing an evaluation and management service by telephone and the availability of in person appointments. I also discussed with the patient that there may be a patient responsible charge related to this service. The patient expressed understanding and agreed to proceed.   History of Present Illness: Reports that he has been having heartburn and acid reflux for the past three weeks.  States that he will wake at night with acid reflux.  Has been taking an OTC heartburn medication (name unknown) without relief.  States new stressor of recent biopsy waiting for results.  Denies NSAID use, denies significant history of GERD.     Observations/Objective: Medical history and current medications reviewed, no physical exam completed    Past Medical History:  Diagnosis Date  . Hypertension   . Tobacco abuse     Past Surgical History:  Procedure Laterality Date  . TONSILLECTOMY      Family History  Problem Relation Age of Onset  . Emphysema Father   . CAD Father        CABG  . Stroke Mother   . CAD Brother 31       CABG  . Hypertension Brother     Social History   Socioeconomic History  . Marital status: Married    Spouse name: Not on file  . Number of children: 2  . Years of education: Not on file  . Highest education level: Not on file  Occupational History  . Occupation: retired  Tobacco Use  . Smoking status: Current Every Day Smoker    Packs/day: 0.50     Years: 40.00    Pack years: 20.00    Types: Cigarettes  . Smokeless tobacco: Never Used  Vaping Use  . Vaping Use: Never used  Substance and Sexual Activity  . Alcohol use: Yes    Alcohol/week: 42.0 standard drinks    Types: 28 Cans of beer, 14 Shots of liquor per week  . Drug use: No  . Sexual activity: Yes    Partners: Female    Birth control/protection: None  Other Topics Concern  . Not on file  Social History Narrative   Lives with girlfriend.     Social Determinants of Health   Financial Resource Strain:   . Difficulty of Paying Living Expenses: Not on file  Food Insecurity:   . Worried About Charity fundraiser in the Last Year: Not on file  . Ran Out of Food in the Last Year: Not on file  Transportation Needs:   . Lack of Transportation (Medical): Not on file  . Lack of Transportation (Non-Medical): Not on file  Physical Activity:   . Days of Exercise per Week: Not on file  . Minutes of Exercise per Session: Not on file  Stress:   . Feeling of Stress : Not on file  Social Connections:   . Frequency of Communication with Friends and Family: Not on file  . Frequency of  Social Gatherings with Friends and Family: Not on file  . Attends Religious Services: Not on file  . Active Member of Clubs or Organizations: Not on file  . Attends Archivist Meetings: Not on file  . Marital Status: Not on file  Intimate Partner Violence:   . Fear of Current or Ex-Partner: Not on file  . Emotionally Abused: Not on file  . Physically Abused: Not on file  . Sexually Abused: Not on file    Outpatient Medications Prior to Visit  Medication Sig Dispense Refill  . acetaminophen (TYLENOL) 500 MG tablet Take 1,000 mg by mouth every 6 (six) hours as needed for mild pain.    Marland Kitchen amLODipine (NORVASC) 5 MG tablet Take 1 tablet (5 mg total) by mouth daily. Dx: I10 90 tablet 0  . lisinopril (ZESTRIL) 5 MG tablet Take 0.5 tablets (2.5 mg total) by mouth daily. 90 tablet 0   No  facility-administered medications prior to visit.    No Known Allergies  ROS Review of Systems  Constitutional: Negative for chills, fatigue and fever.  HENT: Negative.   Eyes: Negative.   Respiratory: Negative for shortness of breath and wheezing.   Cardiovascular: Negative for chest pain.  Gastrointestinal: Negative for abdominal pain, diarrhea, nausea and vomiting.  Endocrine: Negative.   Genitourinary: Negative.   Musculoskeletal: Negative.   Skin: Negative.   Allergic/Immunologic: Negative.   Neurological: Negative.   Hematological: Negative.   Psychiatric/Behavioral: Negative.       Objective:   There were no vitals taken for this visit. Wt Readings from Last 3 Encounters:  10/15/20 155 lb 3.2 oz (70.4 kg)  09/29/20 155 lb 1.6 oz (70.4 kg)  09/21/20 149 lb (67.6 kg)     Health Maintenance Due  Topic Date Due  . PNA vac Low Risk Adult (2 of 2 - PPSV23) 09/23/2020    There are no preventive care reminders to display for this patient.  No results found for: TSH Lab Results  Component Value Date   WBC 8.1 09/08/2020   HGB 16.2 09/08/2020   HCT 47.9 09/08/2020   MCV 103.0 (H) 09/08/2020   PLT 231 09/08/2020   Lab Results  Component Value Date   NA 134 (L) 09/08/2020   K 4.0 09/08/2020   CO2 20 (L) 09/08/2020   GLUCOSE 143 (H) 09/08/2020   BUN 10 09/08/2020   CREATININE 1.46 (H) 09/08/2020   BILITOT 1.1 09/08/2020   ALKPHOS 51 09/08/2020   AST 24 09/08/2020   ALT 17 09/08/2020   PROT 7.2 09/08/2020   ALBUMIN 3.9 09/08/2020   CALCIUM 9.0 09/08/2020   ANIONGAP 15 09/08/2020   Lab Results  Component Value Date   CHOL 182 09/24/2019   Lab Results  Component Value Date   HDL 68 09/24/2019   Lab Results  Component Value Date   LDLCALC 99 09/24/2019   Lab Results  Component Value Date   TRIG 84 09/24/2019   Lab Results  Component Value Date   CHOLHDL 2.7 09/24/2019   No results found for: HGBA1C    Assessment & Plan:   Problem List  Items Addressed This Visit    None    Visit Diagnoses    Gastroesophageal reflux disease without esophagitis    -  Primary   Relevant Medications   pantoprazole (PROTONIX) 40 MG tablet     Assessment and Plan: 1. Gastroesophageal reflux disease without esophagitis Patient education given.  Gerd lifestyle modifications, trial protonix.  The patient was  given clear instructions to go to ER or return to medical center if symptoms don't improve, worsen or new problems develop. The patient verbalized understanding.       Follow Up Instructions:    I discussed the assessment and treatment plan with the patient. The patient was provided an opportunity to ask questions and all were answered. The patient agreed with the plan and demonstrated an understanding of the instructions.   The patient was advised to call back or seek an in-person evaluation if the symptoms worsen or if the condition fails to improve as anticipated.  I provided 21 minutes of non-face-to-face time during this encounter.   Kashia Brossard S Mayers, PA-C  No AVS created, patient declines MyChart   Meds ordered this encounter  Medications  . pantoprazole (PROTONIX) 40 MG tablet    Sig: Take 1 tablet (40 mg total) by mouth daily.    Dispense:  30 tablet    Refill:  1    Order Specific Question:   Supervising Provider    Answer:   Elsie Stain [[id number]]    Follow-up: Return if symptoms worsen or fail to improve.    Loraine Grip Mayers, PA-C

## 2020-10-15 NOTE — Progress Notes (Signed)
Patient has taken medication today. Patient has used OTC (no name recalled) Patient reports acid wakes him up at night and comes at random times.

## 2020-10-15 NOTE — Progress Notes (Signed)
Hematology and Oncology Follow Up Visit  Ronald Lowe [id number] 12-Aug-1956 64 y.o. 10/15/2020 12:38 PM Ronald Lowe, DOWallace, Ronald Lowe*   Principle Diagnosis: 63 year old with right renal mass noted in September 2021.  He was found to have 2.0 x 1.7 x 1.6 cm.  He has also lymphadenopathy that is biopsy proven to be benign.   Prior Therapy: He is status post right inguinal lymph node biopsy completed on October 09, 2020.   Current therapy: Under evaluation for his right kidney mass.  Interim History: Ronald Lowe returns today for a follow-up visit.  Since the last visit, he underwent biopsy without any complications.  The results did not show any malignancy at this time.  Clinically, he reports no inguinal adenopathy that is palpated at this time.  Not reporting any pain or discomfort.  He denies any fevers chills.  He denies any hematuria or dysuria.     Medications: I have reviewed the patient's current medications.  Current Outpatient Medications  Medication Sig Dispense Refill  . acetaminophen (TYLENOL) 500 MG tablet Take 1,000 mg by mouth every 6 (six) hours as needed for mild pain.    Marland Kitchen amLODipine (NORVASC) 5 MG tablet Take 1 tablet (5 mg total) by mouth daily. Dx: I10 90 tablet 0  . lisinopril (ZESTRIL) 5 MG tablet Take 0.5 tablets (2.5 mg total) by mouth daily. 90 tablet 0   No current facility-administered medications for this visit.     Allergies: No Known Allergies    Physical Exam: Blood pressure (!) 150/100, pulse 93, temperature (!) 97.4 F (36.3 C), temperature source Tympanic, resp. rate 18, weight 155 lb 3.2 oz (70.4 kg), SpO2 100 %.   ECOG: 0  General appearance: Comfortable appearing without any discomfort Head: Normocephalic without any trauma Oropharynx: Mucous membranes are moist and pink without any thrush or ulcers. Eyes: Pupils are equal and round reactive to light. Lymph nodes: No cervical, supraclavicular, inguinal or  axillary lymphadenopathy.   Heart:regular rate and rhythm.  S1 and S2 without leg edema. Lung: Clear without any rhonchi or wheezes.  No dullness to percussion. Abdomin: Soft, nontender, nondistended with good bowel sounds.  No hepatosplenomegaly. Musculoskeletal: No joint deformity or effusion.  Full range of motion noted. Neurological: No deficits noted on motor, sensory and deep tendon reflex exam. Skin: No petechial rash or dryness.  Appeared moist.      Lab Results: Lab Results  Component Value Date   WBC 8.1 09/08/2020   HGB 16.2 09/08/2020   HCT 47.9 09/08/2020   MCV 103.0 (H) 09/08/2020   PLT 231 09/08/2020     Chemistry      Component Value Date/Time   NA 134 (L) 09/08/2020 0910   NA 142 09/24/2019 1144   K 4.0 09/08/2020 0910   CL 99 09/08/2020 0910   CO2 20 (L) 09/08/2020 0910   BUN 10 09/08/2020 0910   BUN 9 09/24/2019 1144   CREATININE 1.46 (H) 09/08/2020 0910      Component Value Date/Time   CALCIUM 9.0 09/08/2020 0910   ALKPHOS 51 09/08/2020 0910   AST 24 09/08/2020 0910   ALT 17 09/08/2020 0910   BILITOT 1.1 09/08/2020 0910   BILITOT 1.0 09/24/2019 1144        Impression and Plan:  64 year old with:  1.  Right kidney mass noted in September 2021 measuring 2.0 x 1.7 x 1.6 cm.  Imaging studies did not show any evidence of metastatic disease but did show regional adenopathy with  right inguinal lymph node.  The differential diagnosis of these findings were reviewed.  Given that his inguinal lymph node biopsy does not show clear-cut metastatic disease and likely reactive lymphadenopathy, I will refer him to urology for the management of his kidney mass.  Appeared of observation versus tissue biopsy versus nephrectomy would be a possibility at this time.  Cryoablation could also be a consideration.   2.  Pelvic and inguinal adenopathy: Unclear etiology at this time.  This is biopsy proven to be benign at this time.  Repeat imaging studies may be needed  in the future to follow the status of of these lymphadenopathy.   3.  Follow-up: Will be in the next few months after evaluation by urology    30  minutes were dedicated to this encounter.  The time was spent on reviewing his pathology results, discussing differential diagnosis and management options for the future.    Zola Button, MD 10/28/202112:38 PM

## 2020-11-10 ENCOUNTER — Other Ambulatory Visit: Payer: Self-pay | Admitting: Internal Medicine

## 2020-11-10 DIAGNOSIS — M545 Low back pain, unspecified: Secondary | ICD-10-CM

## 2020-11-10 DIAGNOSIS — G8929 Other chronic pain: Secondary | ICD-10-CM

## 2020-11-24 ENCOUNTER — Other Ambulatory Visit: Payer: Self-pay

## 2020-11-24 ENCOUNTER — Encounter: Payer: Self-pay | Admitting: Internal Medicine

## 2020-11-24 ENCOUNTER — Ambulatory Visit (INDEPENDENT_AMBULATORY_CARE_PROVIDER_SITE_OTHER): Payer: Medicare Other | Admitting: Internal Medicine

## 2020-11-24 VITALS — BP 138/90 | HR 87 | Temp 97.2°F | Resp 17 | Wt 151.0 lb

## 2020-11-24 DIAGNOSIS — I10 Essential (primary) hypertension: Secondary | ICD-10-CM

## 2020-11-24 MED ORDER — PANTOPRAZOLE SODIUM 40 MG PO TBEC
40.0000 mg | DELAYED_RELEASE_TABLET | Freq: Every day | ORAL | 1 refills | Status: DC
Start: 2020-11-24 — End: 2020-12-02

## 2020-11-24 NOTE — Progress Notes (Signed)
  Subjective:    Ronald Lowe - 64 y.o. male MRN [id number]  Date of birth: [birth year]  HPI  Ronald Lowe is here for follow up of HTN. At last visit, BP was above goal at 150/100 and recommended daily compliance with medications as patient had been taking  every third day. He reports he has only been taking every other day.   Chronic HTN Disease Monitoring:  Home BP Monitoring - Does not monitor  Chest pain- no  Dyspnea- no Headache - no  Medications: Amlodipine 5 mg, Lisinopril 2.5 mg  Compliance- yes Lightheadedness- no  Edema- no        Health Maintenance:  Health Maintenance Due  Topic Date Due  . PNA vac Low Risk Adult (2 of 2 - PPSV23) 09/23/2020    -  reports that he has been smoking cigarettes. He has a 20.00 pack-year smoking history. He has never used smokeless tobacco. - Review of Systems: Per HPI. - Past Medical History: Patient Active Problem List   Diagnosis Date Noted  . Positive hepatitis C antibody test 10/02/2019  . Tobacco dependence 09/24/2019  . Alcohol use disorder, mild, abuse 09/24/2019  . Orthostatic hypotension 06/12/2014  . EKG abnormality 06/12/2014  . HTN (hypertension) 06/12/2014   - Medications: reviewed and updated   Objective:   Physical Exam BP 138/90   Pulse 87   Temp (!) 97.2 F (36.2 C) (Temporal)   Resp 17   Wt 151 lb (68.5 kg)   SpO2 98%   BMI 20.48 kg/m  Physical Exam Constitutional:      General: He is not in acute distress.    Appearance: He is not diaphoretic.  HENT:     Head: Normocephalic and atraumatic.  Eyes:     Conjunctiva/sclera: Conjunctivae normal.  Cardiovascular:     Rate and Rhythm: Normal rate and regular rhythm.     Heart sounds: Normal heart sounds. No murmur heard.   Pulmonary:     Effort: Pulmonary effort is normal. No respiratory distress.     Breath sounds: Normal breath sounds.  Musculoskeletal:        General: Normal range of motion.  Skin:    General: Skin is  warm and dry.  Neurological:     Mental Status: He is alert and oriented to person, place, and time.  Psychiatric:        Mood and Affect: Affect normal.        Judgment: Judgment normal.            Assessment & Plan:   1. Essential hypertension BP better controlled with every other day instead of every third day dosing of Lisinopril and Amlodipine. Will leave regimen as is and continue to monitor. Patient is asymptomatic.      Phill Myron, D.O. 11/24/2020, 10:39 AM Primary Care at [facility]

## 2020-11-27 ENCOUNTER — Telehealth: Payer: Self-pay | Admitting: Internal Medicine

## 2020-11-27 NOTE — Telephone Encounter (Signed)
Pt is asking if he could get a call from his PCP at her earliest conveniece, regarding a cough he's been having.

## 2020-11-30 NOTE — Telephone Encounter (Signed)
Please schedule him a phone visit.

## 2020-11-30 NOTE — Telephone Encounter (Signed)
Called pt to schedule tele.visit with Dr. Juleen China. No answer. LVM to return call at [facility] to schedule appt.

## 2020-12-02 ENCOUNTER — Other Ambulatory Visit: Payer: Self-pay | Admitting: Physician Assistant

## 2020-12-28 ENCOUNTER — Other Ambulatory Visit: Payer: Self-pay

## 2020-12-28 DIAGNOSIS — M545 Low back pain, unspecified: Secondary | ICD-10-CM

## 2020-12-28 DIAGNOSIS — G8929 Other chronic pain: Secondary | ICD-10-CM

## 2020-12-28 MED ORDER — MELOXICAM 15 MG PO TABS: 15.0000 mg | ORAL_TABLET | Freq: Every day | ORAL | 0 refills | Status: DC

## 2021-03-08 ENCOUNTER — Other Ambulatory Visit: Payer: Self-pay

## 2021-03-08 ENCOUNTER — Ambulatory Visit (INDEPENDENT_AMBULATORY_CARE_PROVIDER_SITE_OTHER): Payer: Medicare Other | Admitting: Internal Medicine

## 2021-03-08 ENCOUNTER — Encounter: Payer: Self-pay | Admitting: Internal Medicine

## 2021-03-08 ENCOUNTER — Other Ambulatory Visit: Payer: Self-pay | Admitting: Internal Medicine

## 2021-03-08 VITALS — BP 121/81 | HR 81 | Temp 97.2°F | Ht 72.0 in | Wt 152.2 lb

## 2021-03-08 DIAGNOSIS — E119 Type 2 diabetes mellitus without complications: Secondary | ICD-10-CM | POA: Diagnosis not present

## 2021-03-08 DIAGNOSIS — Z87891 Personal history of nicotine dependence: Secondary | ICD-10-CM

## 2021-03-08 DIAGNOSIS — R058 Other specified cough: Secondary | ICD-10-CM

## 2021-03-08 DIAGNOSIS — Z122 Encounter for screening for malignant neoplasm of respiratory organs: Secondary | ICD-10-CM | POA: Diagnosis not present

## 2021-03-08 DIAGNOSIS — J439 Emphysema, unspecified: Secondary | ICD-10-CM

## 2021-03-08 LAB — POCT GLYCOSYLATED HEMOGLOBIN (HGB A1C)

## 2021-03-08 MED ORDER — ALBUTEROL SULFATE HFA 108 (90 BASE) MCG/ACT IN AERS: 2.0000 | INHALATION_SPRAY | Freq: Four times a day (QID) | RESPIRATORY_TRACT | 1 refills | Status: DC | PRN

## 2021-03-08 MED ORDER — ADVAIR HFA 45-21 MCG/ACT IN AERO: 2.0000 | INHALATION_SPRAY | Freq: Two times a day (BID) | RESPIRATORY_TRACT | 12 refills | Status: DC

## 2021-03-08 NOTE — Progress Notes (Unsigned)
Subjective:    Ronald Lowe - 65 y.o. male MRN [id number]  Date of birth: May 06, 1956  HPI  Ronald Lowe is here for acute concerns. Feels like for the past few weeks to months has been spitting out white foam. He is coughing it up. Has always been coughing up stuff from his lungs but has changed in nature. He denies SOB, chest tightness, DOE. Has been cigarette smoker since he was 71, close to 50 years. Smokes THC as well. Has never seen a pulmonologist. His wife has been established. She has prescriptions for Advair, Albuterol, Ipatropium. Patient has no known history of COPD or asthma. No history of seasonal allergies.    Health Maintenance:  Health Maintenance Due  Topic Date Due  . PNA vac Low Risk Adult (2 of 2 - PPSV23) 09/23/2020  . COVID-19 Vaccine (3 - Booster for Pfizer series) 10/10/2020    -  reports that he has been smoking cigarettes. He has a 20.00 pack-year smoking history. He has never used smokeless tobacco. - Review of Systems: Per HPI. - Past Medical History: Patient Active Problem List   Diagnosis Date Noted  . Positive hepatitis C antibody test 10/02/2019  . Tobacco dependence 09/24/2019  . Alcohol use disorder, mild, abuse 09/24/2019  . Orthostatic hypotension 06/12/2014  . EKG abnormality 06/12/2014  . HTN (hypertension) 06/12/2014   - Medications: reviewed and updated   Objective:   Physical Exam BP 121/81 (BP Location: Right Arm, Patient Position: Sitting, Cuff Size: Normal)   Pulse 81   Temp (!) 97.2 F (36.2 C) (Temporal)   Ht 6' (1.829 m)   Wt 152 lb 3.2 oz (69 kg)   SpO2 96%   BMI 20.64 kg/m  Physical Exam Constitutional:      General: He is not in acute distress.    Appearance: He is not diaphoretic.  HENT:     Head: Normocephalic and atraumatic.  Eyes:     Conjunctiva/sclera: Conjunctivae normal.  Cardiovascular:     Rate and Rhythm: Normal rate and regular rhythm.     Heart sounds: Normal heart sounds. No murmur  heard.   Pulmonary:     Effort: Pulmonary effort is normal. No respiratory distress.     Breath sounds: Wheezing present.     Comments: Chronic, intermittent cough noted.  Musculoskeletal:        General: Normal range of motion.  Skin:    General: Skin is warm and dry.  Neurological:     Mental Status: He is alert and oriented to person, place, and time.  Psychiatric:        Mood and Affect: Affect normal.        Judgment: Judgment normal.          Assessment & Plan:   1. Productive cough 2. History of smoking Suspect that patient has undiagnosed COPD given report of symptoms, lung exam findings, and significant smoking history. CXR Sept 2021 showed mild peribronchial cuffing and mild pleuroparenchymal thickening. Will repeat CXR today. Send in rescue inhaler and start chronic ICS-LABA combo. Will refer to pulmonology for PFTs and further evaluation.  - DG Chest 2 View; Future - albuterol (VENTOLIN HFA) 108 (90 Base) MCG/ACT inhaler; Inhale 2 puffs into the lungs every 6 (six) hours as needed for wheezing or shortness of breath.  Dispense: 18 g; Refill: 1 - fluticasone-salmeterol (ADVAIR HFA) 45-21 MCG/ACT inhaler; Inhale 2 puffs into the lungs 2 (two) times daily.  Dispense: 1 each; Refill: 12 -  Ambulatory referral to Pulmonology - CT CHEST LUNG CA SCREEN LOW DOSE W/O CM; Future  3. Screening for lung cancer Discussed that patient qualifies for low dose CT chest for lung cancer screening given age >87, >20 year pack history, and current smoker.  - CT CHEST LUNG CA SCREEN LOW DOSE W/O CM; Future  4. Type 2 diabetes mellitus without complication, without long-term current use of insulin ([place]) - HgB A1c   Phill Myron, D.O. 03/08/2021, 10:24 AM Primary Care at [facility]

## 2021-03-08 NOTE — Progress Notes (Signed)
Pt has been using his wifes inhalers he has brought them with him. He is wanting to know if he can get a prescription of his own  Pt reports he did not take nay medication over the weekend  Takes Bp meds before bed

## 2021-03-09 NOTE — Telephone Encounter (Signed)
It looks like all the inhalers are T3 on Medicare list. Can we clarify with the pharmacy if there is an alternative that they know would be covered? Thanks.   Phill Myron, D.O. Primary Care at [facility]  03/09/2021, 10:13 AM

## 2021-03-15 NOTE — Telephone Encounter (Signed)
Current inhaler is not covered, please provide an alternate for patient.

## 2021-03-16 ENCOUNTER — Ambulatory Visit (HOSPITAL_COMMUNITY)
Admission: RE | Admit: 2021-03-16 | Discharge: 2021-03-16 | Disposition: A | Discharge status: Home / Self Care | Payer: Medicare Other | Source: Ambulatory Visit | Attending: Internal Medicine | Admitting: Internal Medicine

## 2021-03-16 ENCOUNTER — Other Ambulatory Visit: Payer: Self-pay

## 2021-03-16 DIAGNOSIS — Z122 Encounter for screening for malignant neoplasm of respiratory organs: Secondary | ICD-10-CM

## 2021-03-16 DIAGNOSIS — Z87891 Personal history of nicotine dependence: Secondary | ICD-10-CM

## 2021-03-16 DIAGNOSIS — J984 Other disorders of lung: Secondary | ICD-10-CM | POA: Diagnosis not present

## 2021-03-16 DIAGNOSIS — R058 Other specified cough: Secondary | ICD-10-CM | POA: Insufficient documentation

## 2021-03-16 DIAGNOSIS — R059 Cough, unspecified: Secondary | ICD-10-CM | POA: Diagnosis not present

## 2021-03-16 DIAGNOSIS — J398 Other specified diseases of upper respiratory tract: Secondary | ICD-10-CM | POA: Diagnosis not present

## 2021-03-16 DIAGNOSIS — J438 Other emphysema: Secondary | ICD-10-CM | POA: Diagnosis not present

## 2021-03-16 IMAGING — CT CT CHEST W/O CM
2 of 4 series · 15 of 36 positions shown, 18 images · non-contrast
Comparison: CT abdomen pelvis [DATE]

CLINICAL DATA: Lung cancer screening. Productive cough for 6
months. Smoker for 50 years.

EXAM:
CT CHEST WITHOUT CONTRAST
TECHNIQUE: Multidetector CT imaging of the chest was performed following the
standard protocol without IV contrast.

[Series 2: thorax · axial · 0.67mm/px · z∈[-486,-168]mm · 12 of 189 slices shown, 15 images]
[im 15/189  mediastinal]
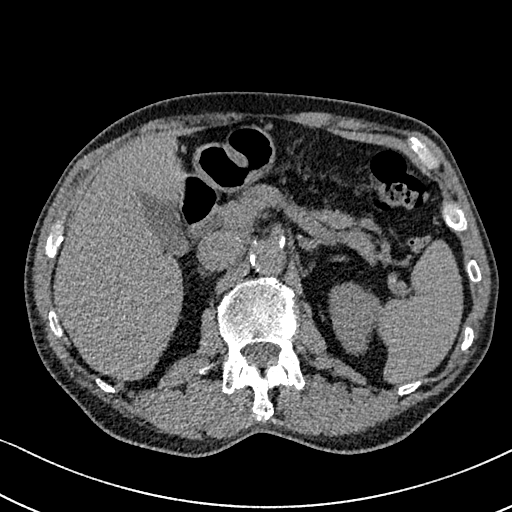
[im 15/189  lung]
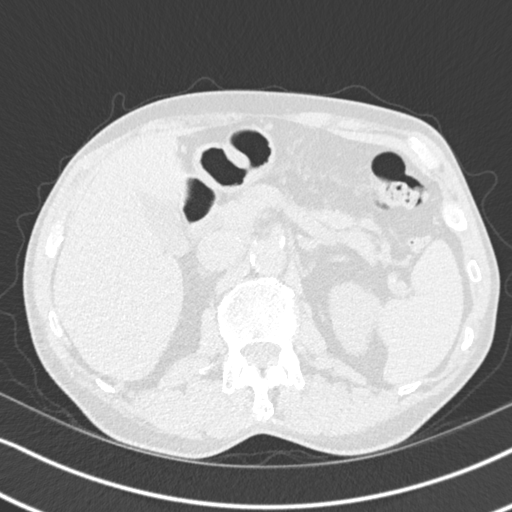
[im 29/189  lung]
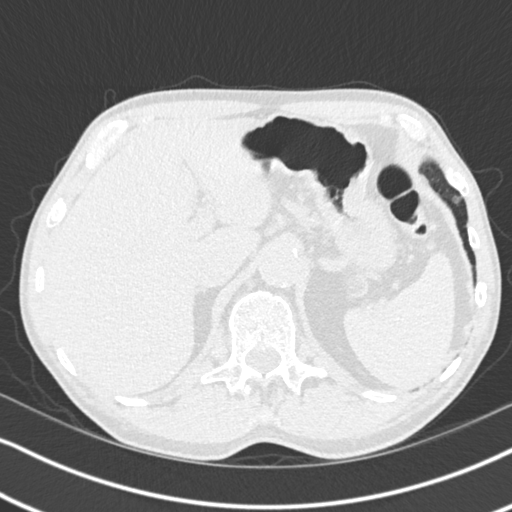
[im 44/189  lung]
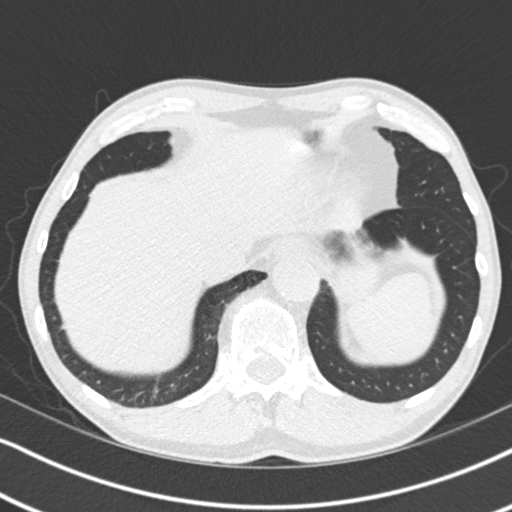
[im 58/189  lung]
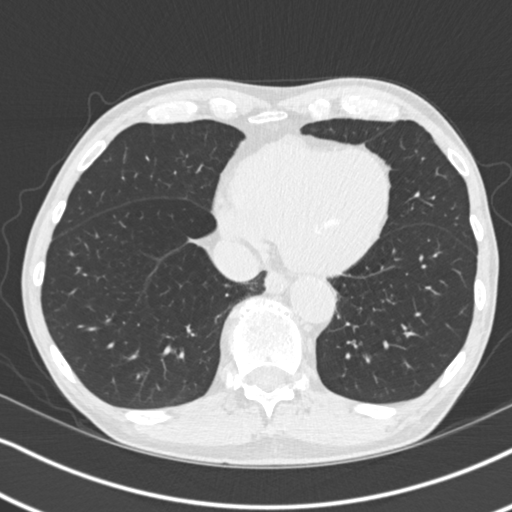
[im 73/189  mediastinal]
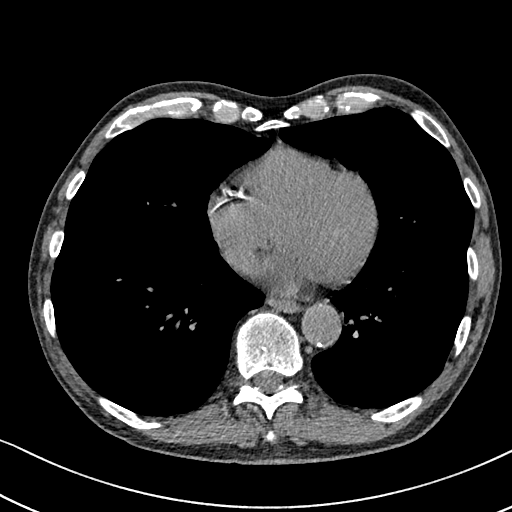
[im 73/189  lung]
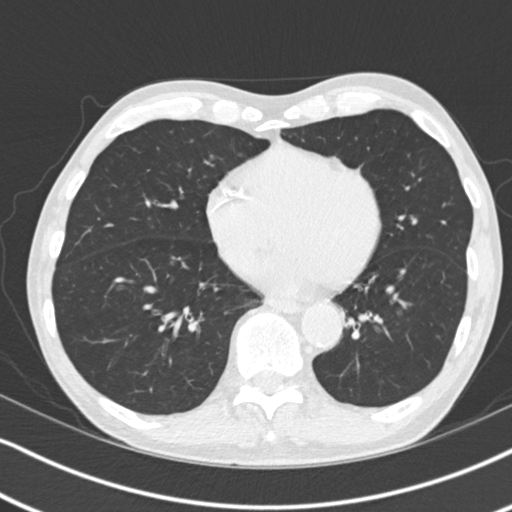
[im 87/189  lung]
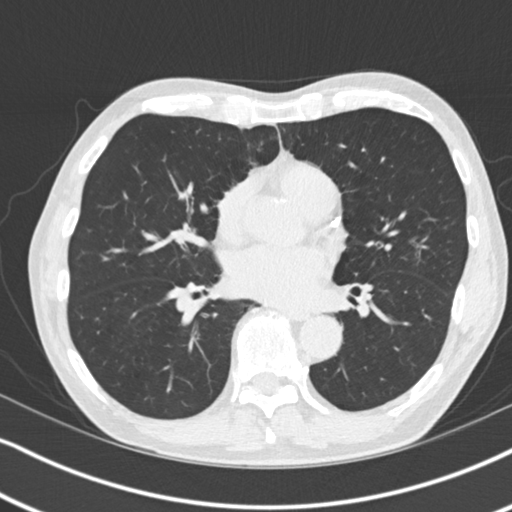
[im 102/189  lung]
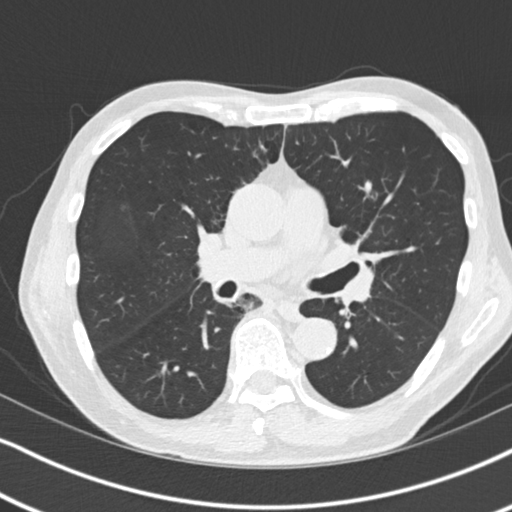
[im 116/189  lung]
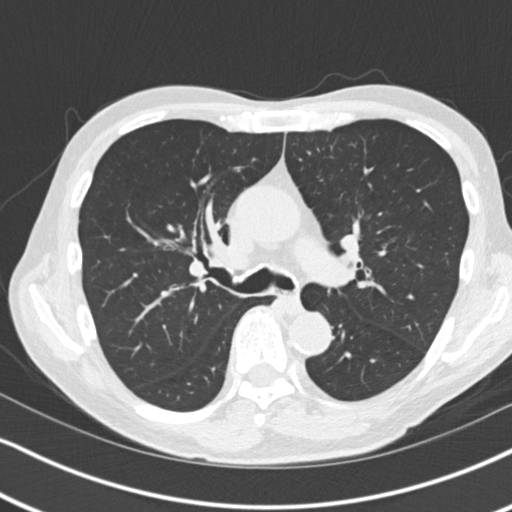
[im 131/189  mediastinal]
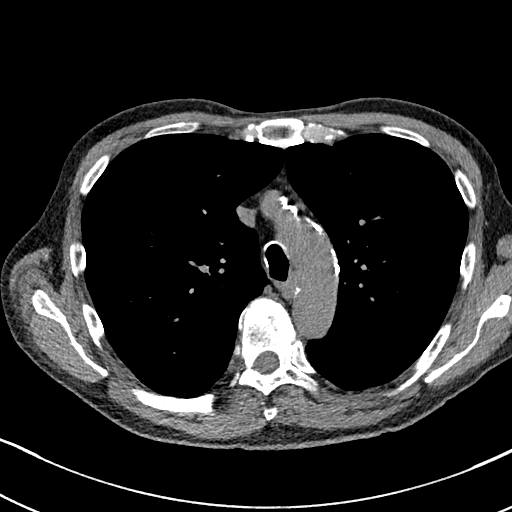
[im 131/189  lung]
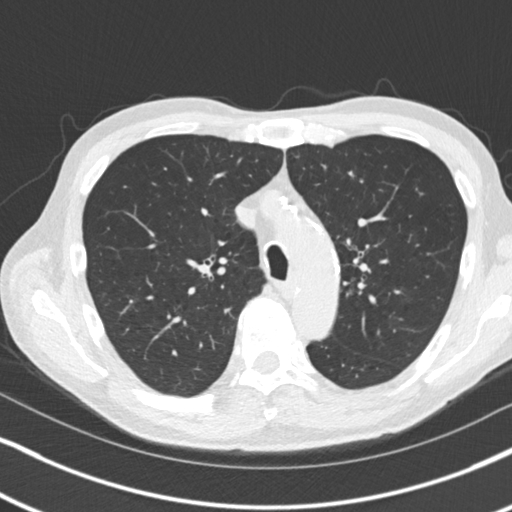
[im 145/189  lung]
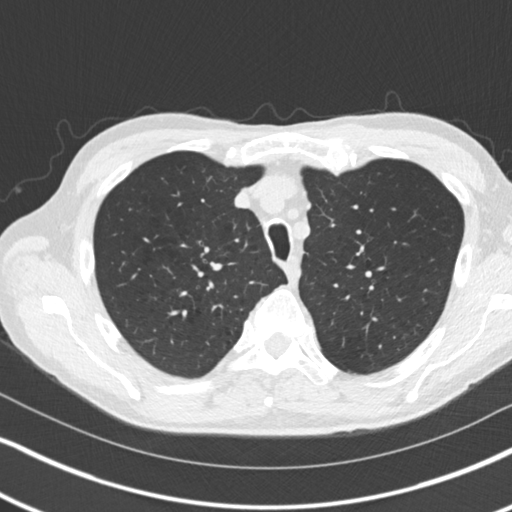
[im 160/189  lung]
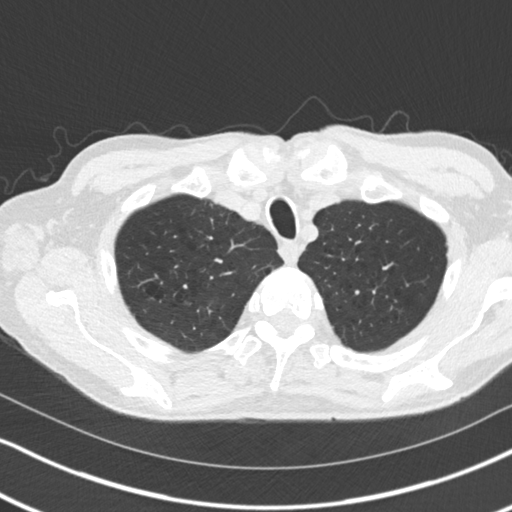
[im 174/189  lung]
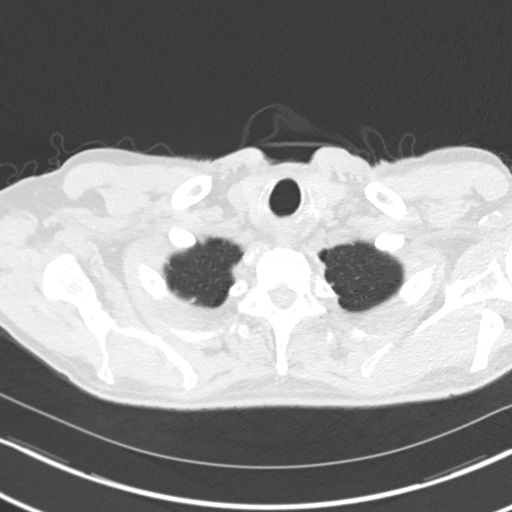

[Series 6: coronal · coronal · 0.73mm/px · 3 of 139 slices shown]
[im 28/139  lung]
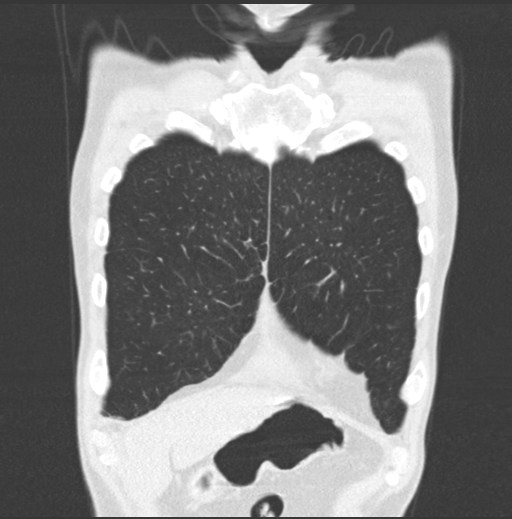
[im 56/139  lung]
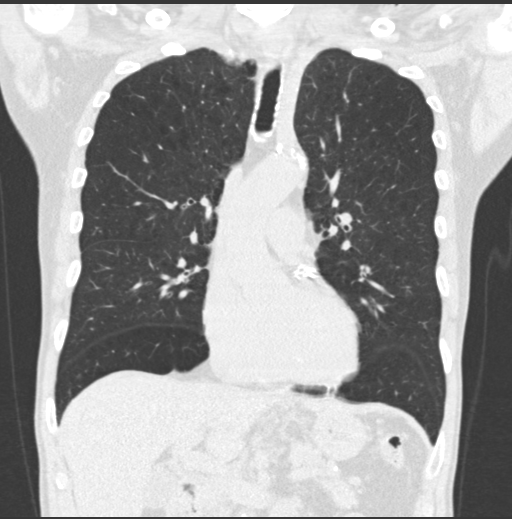
[im 83/139  lung]
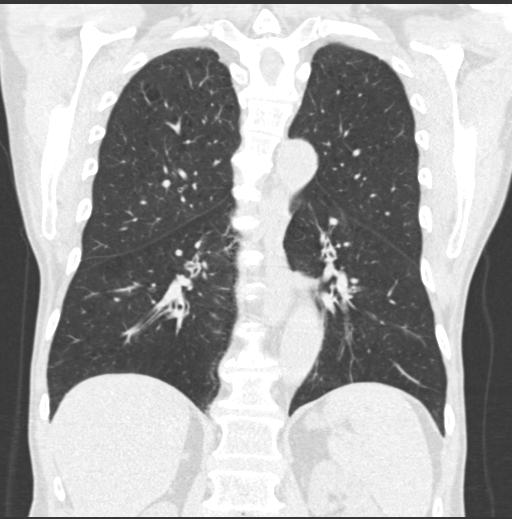

[15 of 36 positions shown; findings below may reference images not displayed]

FINDINGS: Cardiovascular: Normal heart size. No significant pericardial
effusion. The thoracic aorta is normal in caliber. Mild
atherosclerotic plaque of the thoracic aorta. At least mild
three-vessel coronary artery calcifications. The main pulmonary
artery is normal in caliber.

Mediastinum/Nodes: No gross hilar adenopathy, noting limited
sensitivity for the detection of hilar adenopathy on this
noncontrast study. No enlarged mediastinal or axillary lymph nodes.
Thyroid gland, trachea, and esophagus demonstrate no significant
findings.

Lungs/Pleura: Debris within the trachea and main bronchi
bilaterally. Biapical pleural/pulmonary scarring. Moderate
centrilobular emphysematous changes. Right lower lobe subsegmental
atelectasis. Couple of subpleural micronodules within the right
upper lobe ([DATE], 77, 80). No pulmonary mass. No pleural effusion.
No pneumothorax.

Upper Abdomen: No acute abnormality.

Musculoskeletal:

No abdominal wall hernia or abnormality.

No suspicious lytic or blastic osseous lesions. No acute displaced
fracture. Multilevel degenerative changes of the spine.
IMPRESSION: 1. Debris within the trachea and main bronchi bilaterally. Correlate
with aspiration.
2. Smoking-related emphysema and small airway disease.
3. Aortic Atherosclerosis ([FQ]-[FQ]) and Emphysema ([FQ]-[FQ]).
4. Recommend continued yearly low-dose lung cancer screening chest
CT.

## 2021-03-16 IMAGING — DX DG CHEST 2V
2 series · 2 of 2 positions shown · non-contrast
Comparison: [DATE]

CLINICAL DATA: Productive cough and history of tobacco abuse.

EXAM:
CHEST - 2 VIEW

[chest pa]
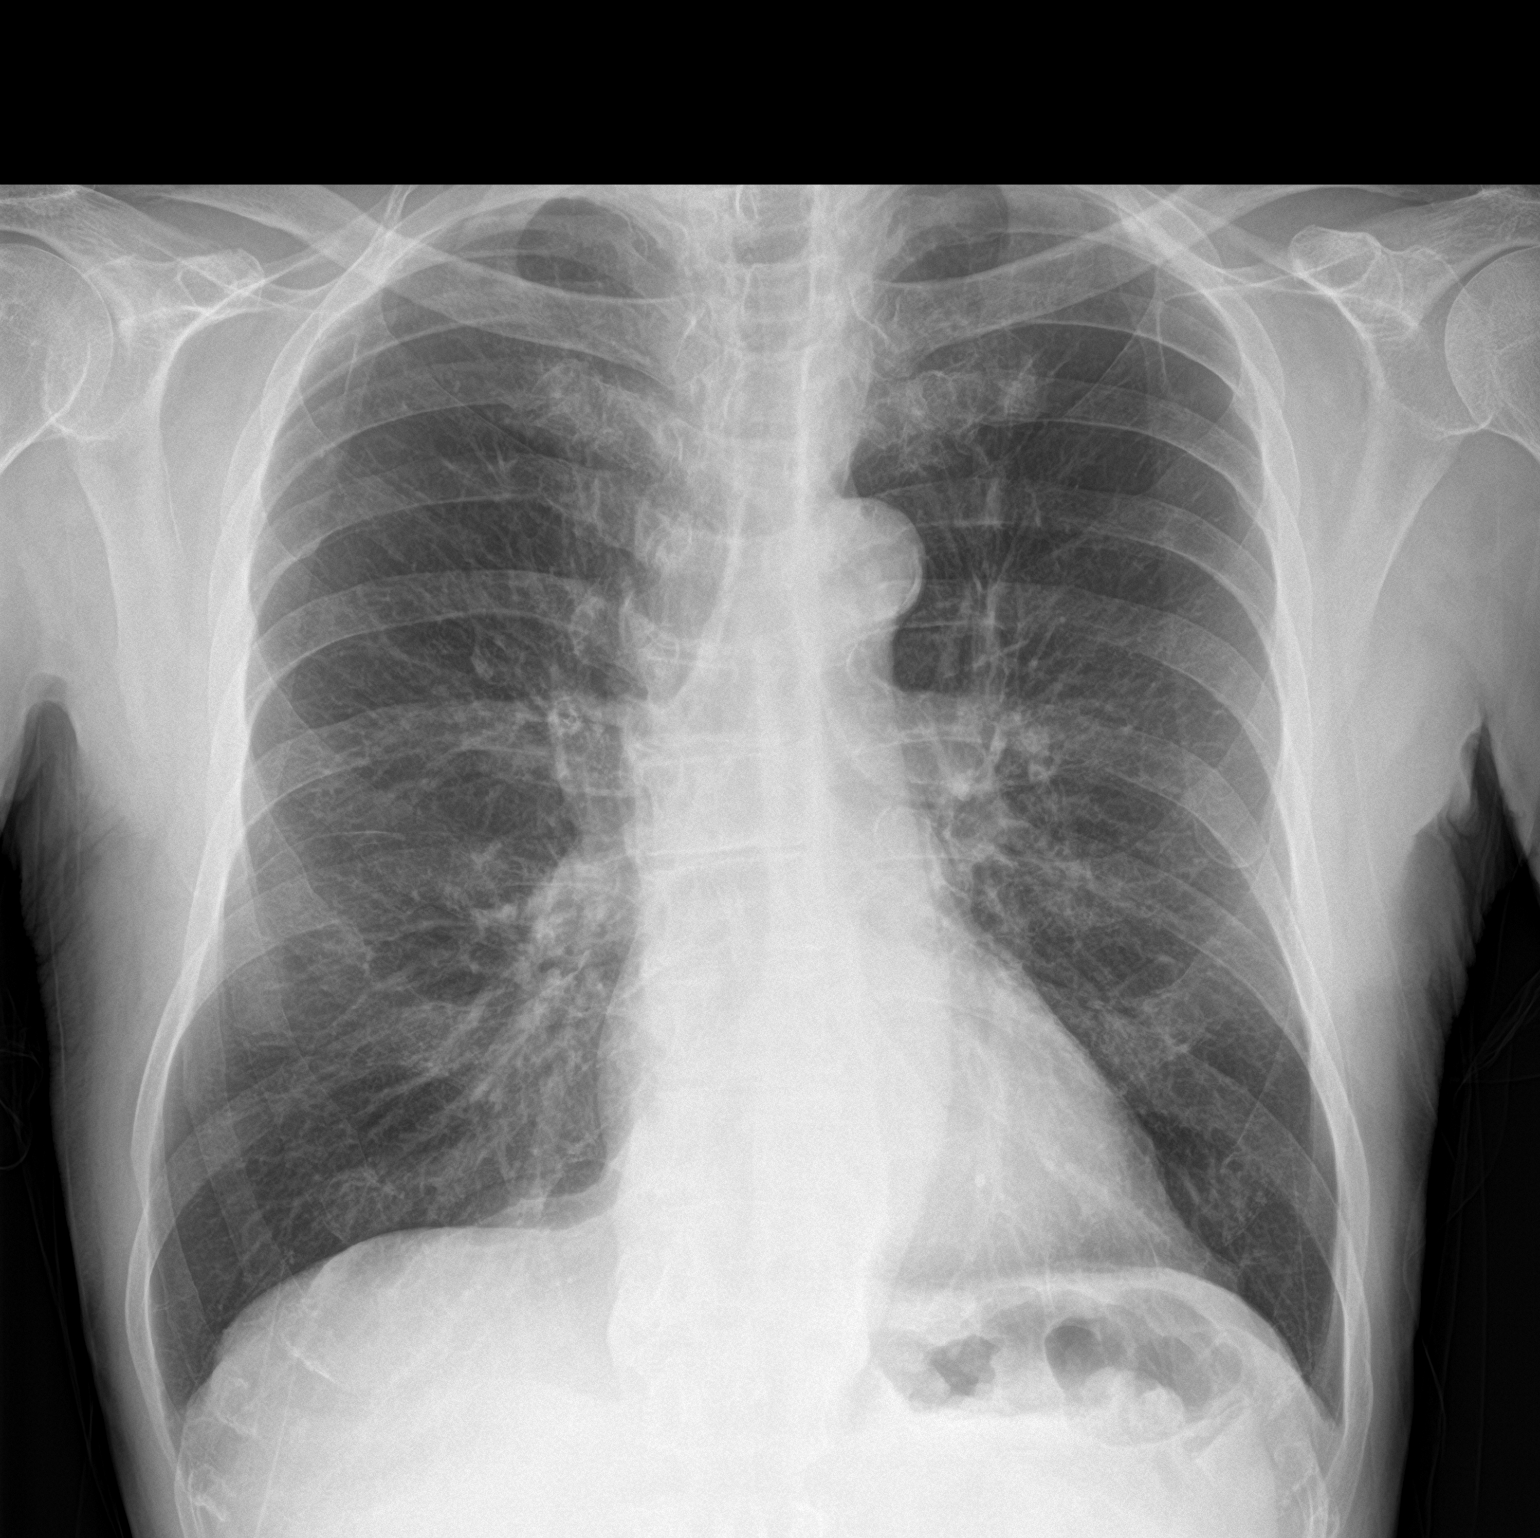

[chest lat]
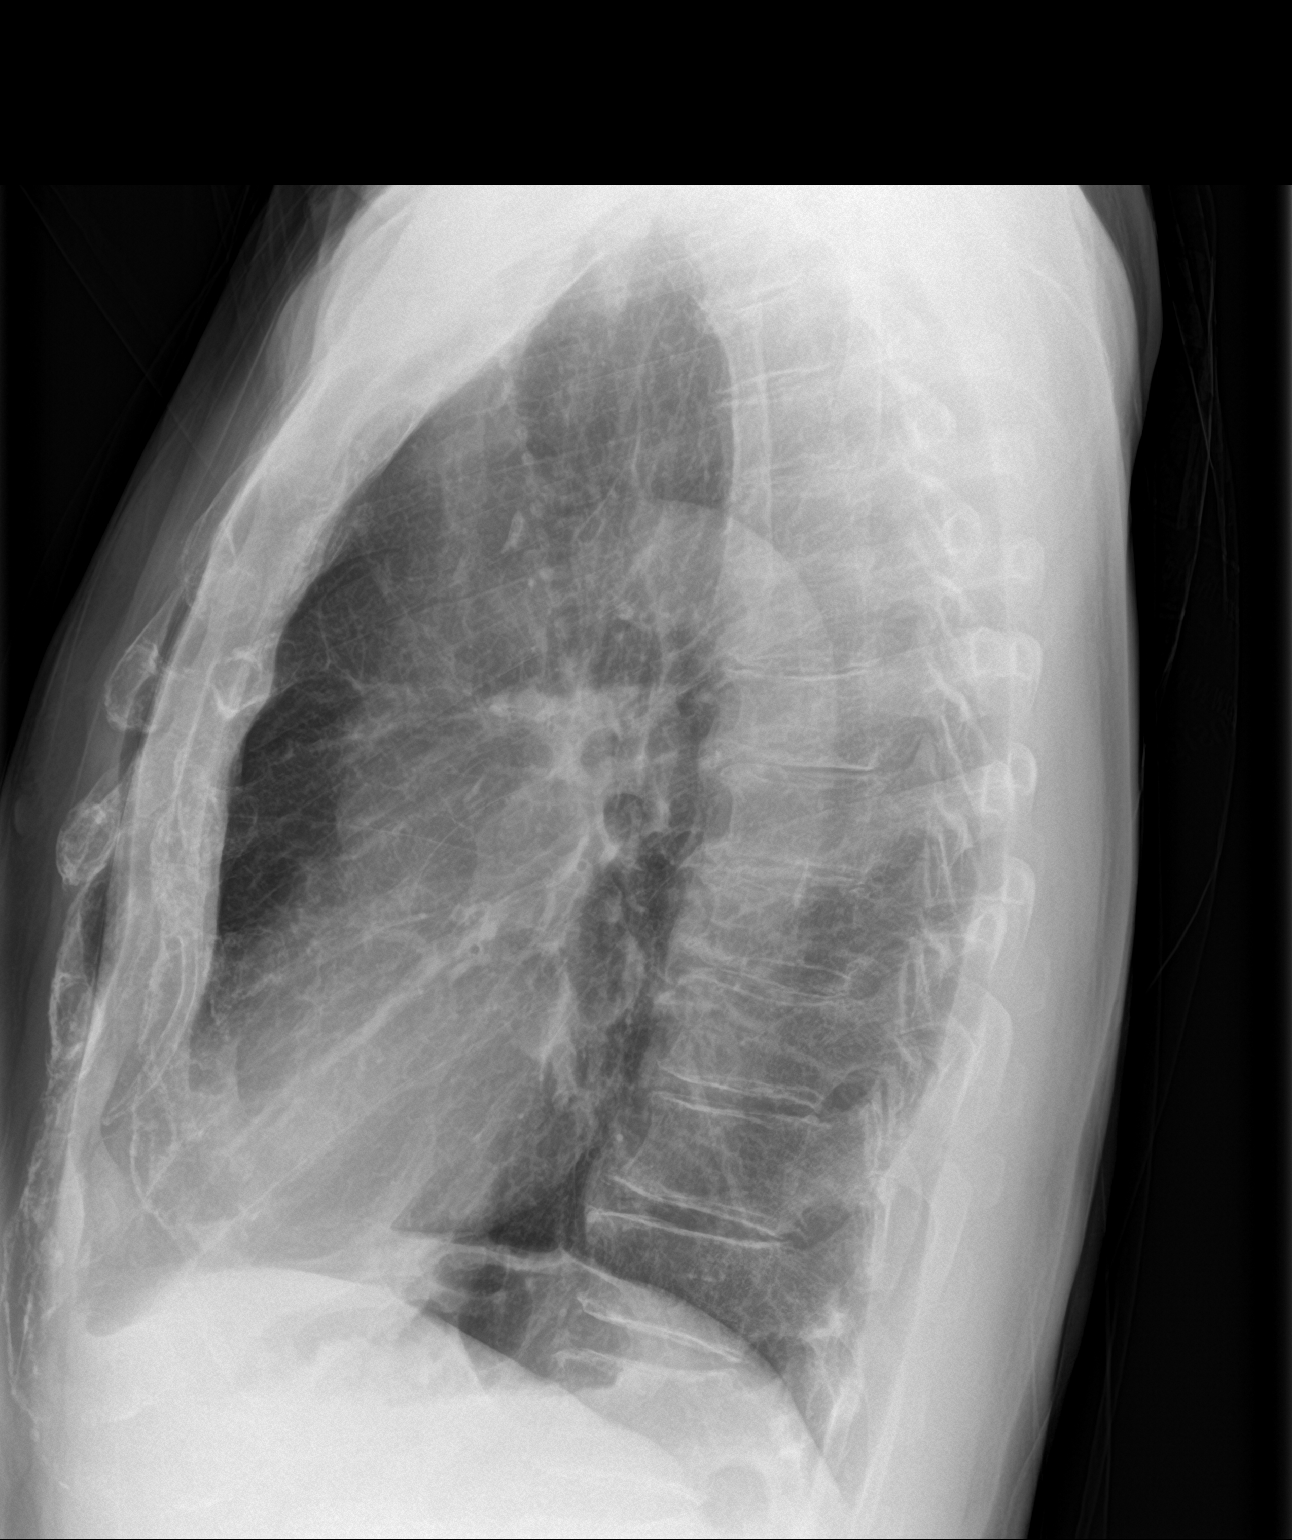

[2 of 2 positions shown; findings below may reference images not displayed]

FINDINGS: Cardiac shadow is within normal limits. Mild aortic calcifications
are noted. The lungs are well aerated bilaterally. No focal
infiltrate or sizable effusion is seen. No focal nodule is seen.
Degenerative changes of the thoracic spine are noted.
IMPRESSION: No acute abnormality noted.

## 2021-03-16 NOTE — Addendum Note (Signed)
Addended by: Camillia Herter on: 03/16/2021 11:24 AM   Modules accepted: Orders

## 2021-03-17 DIAGNOSIS — J439 Emphysema, unspecified: Secondary | ICD-10-CM | POA: Insufficient documentation

## 2021-03-17 NOTE — Progress Notes (Signed)
Referral to Pulmonology for emphysema.   Patient encouraged to return for updated cholesterol testing for aortic atherosclerosis/hardening of the arteries.   Lung cancer shows no mass. Repeat screening in 1 year or sooner if needed.

## 2021-03-17 NOTE — Addendum Note (Signed)
Addended by: Camillia Herter on: 03/17/2021 10:09 AM   Modules accepted: Orders

## 2021-03-18 ENCOUNTER — Telehealth: Payer: Self-pay | Admitting: Internal Medicine

## 2021-03-18 NOTE — Telephone Encounter (Signed)
Pt is calling, asking for the latest Imaging Results ordered by PCP. Pt was informed PCP will be back on Monday. Pt agreed that'd be fine. Please advise and thankyou

## 2021-03-19 ENCOUNTER — Encounter: Payer: Self-pay | Admitting: *Deleted

## 2021-03-19 NOTE — Telephone Encounter (Signed)
Patient given results to CT scan-  Referral to Pulmonology for emphysema.   Patient encouraged to return for updated cholesterol testing for aortic atherosclerosis/hardening of the arteries.   Lung cancer shows no mass. Repeat screening in 1 year or sooner if needed.    Please advise for labs on cholesterol.  Place orders if applicable.

## 2021-03-22 ENCOUNTER — Other Ambulatory Visit: Payer: Self-pay | Admitting: Internal Medicine

## 2021-03-22 DIAGNOSIS — I700 Atherosclerosis of aorta: Secondary | ICD-10-CM

## 2021-03-22 NOTE — Telephone Encounter (Signed)
I have placed orders for lipid panel. Please have him return for testing when he is fasting. Given the aortic atherosclerosis present on imaging, would likely recommend considering starting medication for that finding but we will obtain labs first to have more data.   Phill Myron, D.O. Primary Care at [facility]  03/22/2021, 9:12 AM

## 2021-03-23 NOTE — Telephone Encounter (Signed)
Please schedule patient an appt for tomorrow to come to office to have lab test: Lipid panel.   Left message on voicemail to return call.

## 2021-03-24 ENCOUNTER — Other Ambulatory Visit: Payer: Self-pay

## 2021-03-24 ENCOUNTER — Telehealth: Payer: Self-pay | Admitting: Internal Medicine

## 2021-03-24 ENCOUNTER — Other Ambulatory Visit: Payer: Medicare Other

## 2021-03-24 DIAGNOSIS — I700 Atherosclerosis of aorta: Secondary | ICD-10-CM

## 2021-03-24 NOTE — Telephone Encounter (Signed)
L. Hand swollen, hurting, so much that his wedding ring was stuck and was able to remove only with Dishwasher soap. Pt states it's been swollen since 03/16/21 since he got his CT Scan, and it has been feeling very hot.  Pt is asking if there's something he could do if he's retaining liquids and if the CT scan had anything to do with that. Please advise and thank you

## 2021-03-25 ENCOUNTER — Other Ambulatory Visit: Payer: Self-pay | Admitting: Internal Medicine

## 2021-03-25 LAB — LIPID PANEL
Chol/HDL Ratio: 2.1 ratio (ref 0.0–5.0)
Cholesterol, Total: 154 mg/dL (ref 100–199)
HDL: 74 mg/dL (ref >39)
LDL Chol Calc (NIH): 66 mg/dL (ref 0–99)
Triglycerides: 74 mg/dL (ref 0–149)
VLDL Cholesterol Cal: 14 mg/dL (ref 5–40)

## 2021-03-25 MED ORDER — ATORVASTATIN CALCIUM 40 MG PO TABS: 40.0000 mg | ORAL_TABLET | Freq: Every day | ORAL | 3 refills | Status: DC

## 2021-03-26 NOTE — Telephone Encounter (Signed)
States that swelling is not bad today.  It onset after he had CT scan.  He did not have contrast with CT.   Advised patient that he may need lab test for inflammation or arthritis. At this time he did not wish to schedule an appt. Advised he could f/u with UC or the [facility] if it persist in the future since we rarely have same day appts.    Also given phone numbers to: [facility] for CT f/u emphysema Urology for f/u kidney mass and Dr. Alen Blew

## 2021-04-07 ENCOUNTER — Other Ambulatory Visit: Payer: Self-pay

## 2021-04-07 ENCOUNTER — Emergency Department (HOSPITAL_COMMUNITY)
Admission: EM | Admit: 2021-04-07 | Discharge: 2021-04-07 | Disposition: A | Discharge status: Home / Self Care | Payer: Medicare Other | Attending: Emergency Medicine | Admitting: Emergency Medicine

## 2021-04-07 ENCOUNTER — Emergency Department (HOSPITAL_COMMUNITY): Payer: Medicare Other

## 2021-04-07 DIAGNOSIS — N179 Acute kidney failure, unspecified: Secondary | ICD-10-CM | POA: Diagnosis not present

## 2021-04-07 DIAGNOSIS — Z79899 Other long term (current) drug therapy: Secondary | ICD-10-CM | POA: Diagnosis not present

## 2021-04-07 DIAGNOSIS — R531 Weakness: Secondary | ICD-10-CM

## 2021-04-07 DIAGNOSIS — I10 Essential (primary) hypertension: Secondary | ICD-10-CM | POA: Insufficient documentation

## 2021-04-07 DIAGNOSIS — R4182 Altered mental status, unspecified: Secondary | ICD-10-CM | POA: Diagnosis not present

## 2021-04-07 DIAGNOSIS — F17210 Nicotine dependence, cigarettes, uncomplicated: Secondary | ICD-10-CM | POA: Insufficient documentation

## 2021-04-07 DIAGNOSIS — I951 Orthostatic hypotension: Secondary | ICD-10-CM | POA: Insufficient documentation

## 2021-04-07 DIAGNOSIS — R000 Tachycardia, unspecified: Secondary | ICD-10-CM | POA: Diagnosis not present

## 2021-04-07 LAB — PROTIME-INR
INR: 1 (ref 0.8–1.2)
Prothrombin Time: 12.8 seconds (ref 11.4–15.2)

## 2021-04-07 LAB — I-STAT CHEM 8, ED
BUN: 11 mg/dL (ref 8–23)
Calcium, Ion: 1.02 mmol/L — ABNORMAL LOW (ref 1.15–1.40)
Chloride: 98 mmol/L (ref 98–111)
Creatinine, Ser: 1.9 mg/dL — ABNORMAL HIGH (ref 0.61–1.24)
Glucose, Bld: 135 mg/dL — ABNORMAL HIGH (ref 70–99)
HCT: 50 % (ref 39.0–52.0)
Hemoglobin: 17 g/dL (ref 13.0–17.0)
Potassium: 3.6 mmol/L (ref 3.5–5.1)
Sodium: 134 mmol/L — ABNORMAL LOW (ref 135–145)
TCO2: 23 mmol/L (ref 22–32)

## 2021-04-07 LAB — COMPREHENSIVE METABOLIC PANEL
ALT: 32 U/L (ref 0–44)
AST: 55 U/L — ABNORMAL HIGH (ref 15–41)
Albumin: 4.6 g/dL (ref 3.5–5.0)
Alkaline Phosphatase: 60 U/L (ref 38–126)
Anion gap: 12 (ref 5–15)
BUN: 11 mg/dL (ref 8–23)
CO2: 25 mmol/L (ref 22–32)
Calcium: 9.5 mg/dL (ref 8.9–10.3)
Chloride: 98 mmol/L (ref 98–111)
Creatinine, Ser: 2.09 mg/dL — ABNORMAL HIGH (ref 0.61–1.24)
GFR, Estimated: 34 mL/min — ABNORMAL LOW (ref >60)
Glucose, Bld: 138 mg/dL — ABNORMAL HIGH (ref 70–99)
Potassium: 3.6 mmol/L (ref 3.5–5.1)
Sodium: 135 mmol/L (ref 135–145)
Total Bilirubin: 1.9 mg/dL — ABNORMAL HIGH (ref 0.3–1.2)
Total Protein: 7.7 g/dL (ref 6.5–8.1)

## 2021-04-07 LAB — DIFFERENTIAL
Abs Immature Granulocytes: 0.08 10*3/uL — ABNORMAL HIGH (ref 0.00–0.07)
Basophils Absolute: 0.1 10*3/uL (ref 0.0–0.1)
Basophils Relative: 1 %
Eosinophils Absolute: 0.3 10*3/uL (ref 0.0–0.5)
Eosinophils Relative: 3 %
Immature Granulocytes: 1 %
Lymphocytes Relative: 9 %
Lymphs Abs: 1 10*3/uL (ref 0.7–4.0)
Monocytes Absolute: 0.8 10*3/uL (ref 0.1–1.0)
Monocytes Relative: 7 %
Neutro Abs: 9 10*3/uL — ABNORMAL HIGH (ref 1.7–7.7)
Neutrophils Relative %: 79 %

## 2021-04-07 LAB — URINALYSIS, ROUTINE W REFLEX MICROSCOPIC
Bilirubin Urine: NEGATIVE
Glucose, UA: NEGATIVE mg/dL
Hgb urine dipstick: NEGATIVE
Ketones, ur: NEGATIVE mg/dL
Leukocytes,Ua: NEGATIVE
Nitrite: NEGATIVE
Protein, ur: NEGATIVE mg/dL
Specific Gravity, Urine: 1.005 (ref 1.005–1.030)
pH: 7 (ref 5.0–8.0)

## 2021-04-07 LAB — CBC
HCT: 47.3 % (ref 39.0–52.0)
Hemoglobin: 16.3 g/dL (ref 13.0–17.0)
MCH: 35.1 pg — ABNORMAL HIGH (ref 26.0–34.0)
MCHC: 34.5 g/dL (ref 30.0–36.0)
MCV: 101.7 fL — ABNORMAL HIGH (ref 80.0–100.0)
Platelets: 263 10*3/uL (ref 150–400)
RBC: 4.65 MIL/uL (ref 4.22–5.81)
RDW: 13 % (ref 11.5–15.5)
WBC: 11.3 10*3/uL — ABNORMAL HIGH (ref 4.0–10.5)
nRBC: 0 % (ref 0.0–0.2)

## 2021-04-07 LAB — APTT: aPTT: 28 seconds (ref 24–36)

## 2021-04-07 LAB — CK: Total CK: 225 U/L (ref 49–397)

## 2021-04-07 LAB — RAPID URINE DRUG SCREEN, HOSP PERFORMED
Amphetamines: POSITIVE — AB
Barbiturates: NOT DETECTED
Benzodiazepines: NOT DETECTED
Cocaine: NOT DETECTED
Opiates: NOT DETECTED
Tetrahydrocannabinol: NOT DETECTED

## 2021-04-07 LAB — ETHANOL: Alcohol, Ethyl (B): <10 mg/dL (ref <10)

## 2021-04-07 LAB — CBG MONITORING, ED: Glucose-Capillary: 175 mg/dL — ABNORMAL HIGH (ref 70–99)

## 2021-04-07 IMAGING — CT CT HEAD W/O CM
3 series · 15 of 47 positions shown, 18 images · non-contrast
Comparison: None.

CLINICAL DATA: Mental status change.

EXAM:
CT HEAD WITHOUT CONTRAST
TECHNIQUE: Contiguous axial images were obtained from the base of the skull
through the vertex without intravenous contrast.

[Series 3: head 5.0 h30s · axial · 0.46mm/px · z∈[-54,+71]mm · 9 of 31 slices shown, 12 images]
[im 3/31  brain]
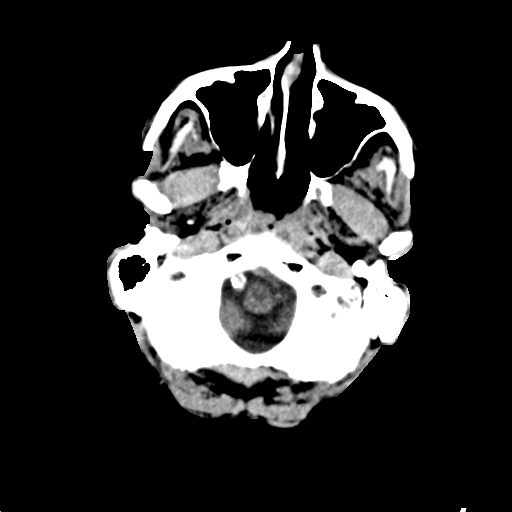
[im 3/31  bone]
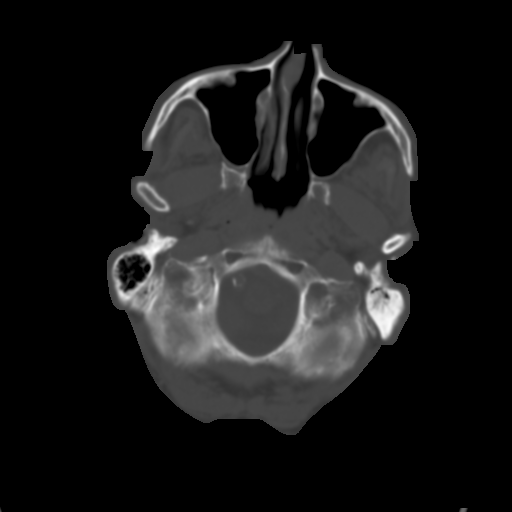
[im 6/31  brain]
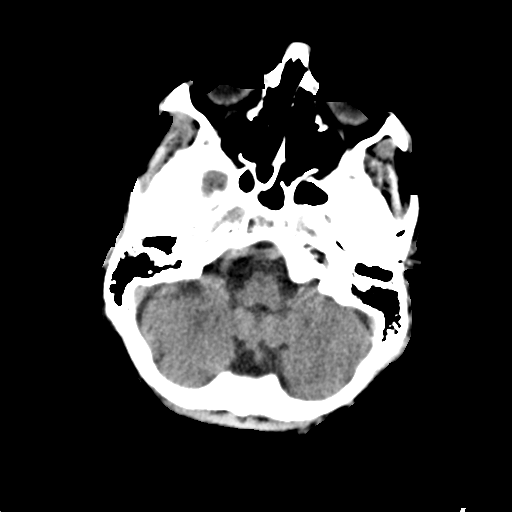
[im 9/31  brain]
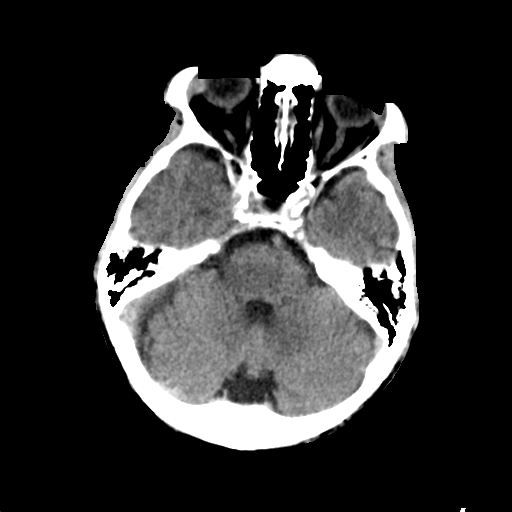
[im 12/31  brain]
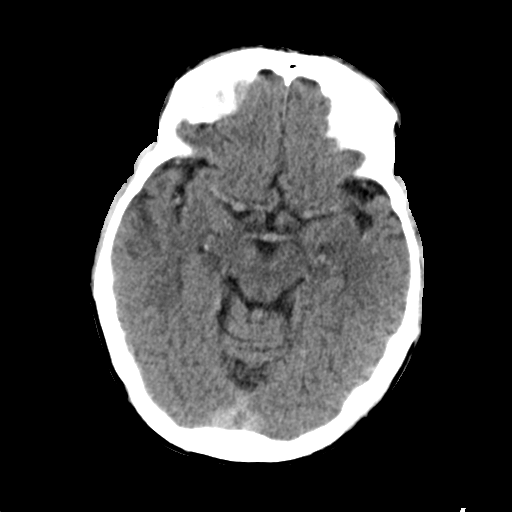
[im 16/31  brain]
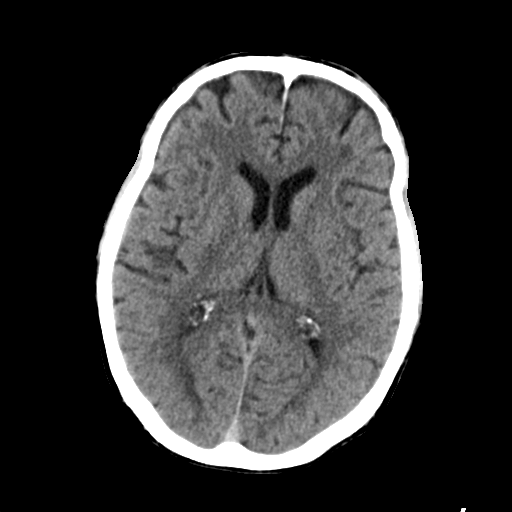
[im 16/31  bone]
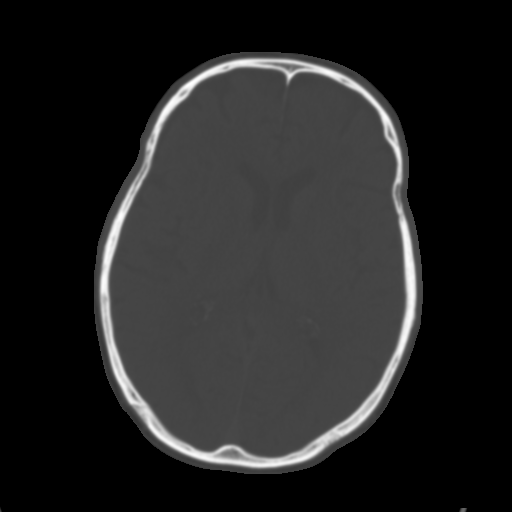
[im 19/31  brain]
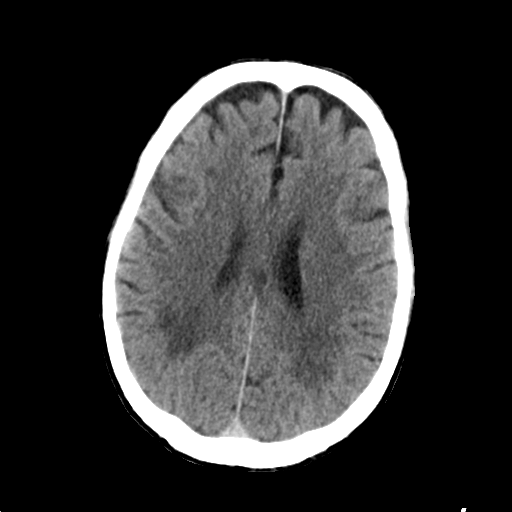
[im 22/31  brain]
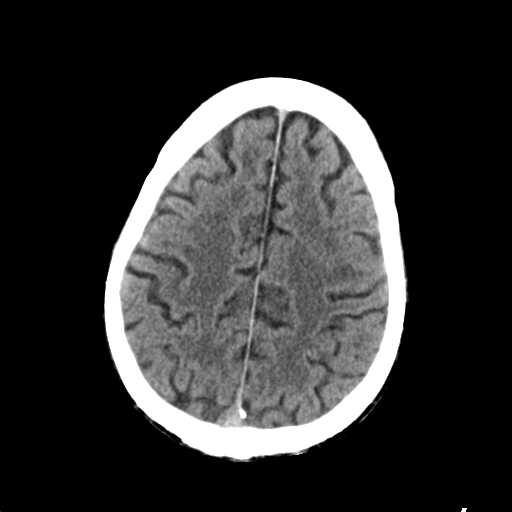
[im 25/31  brain]
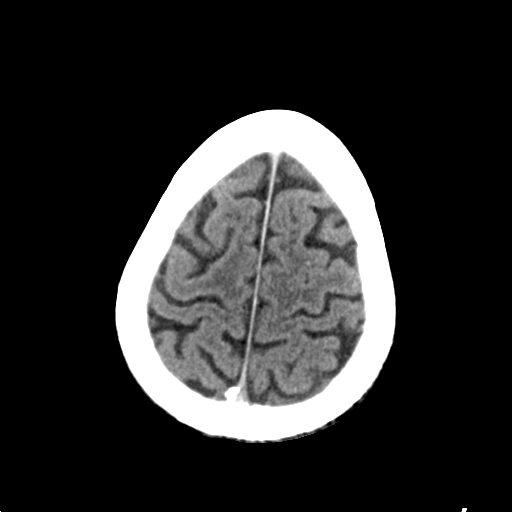
[im 28/31  brain]
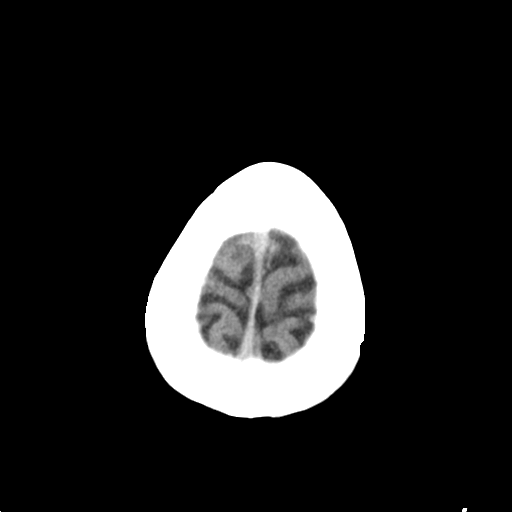
[im 28/31  bone]
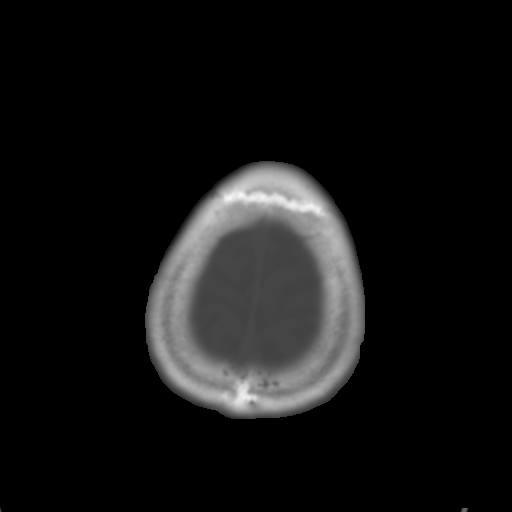

[Series 5: head 3.0 mpr cor · coronal · 0.33mm/px · 3 of 67 slices shown]
[im 23/67  brain]
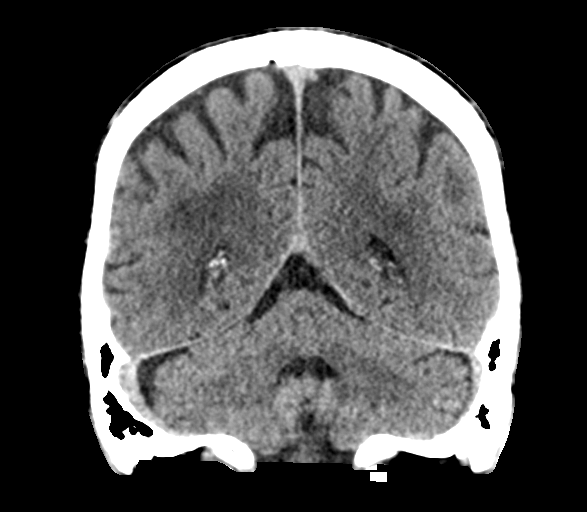
[im 30/67  brain]
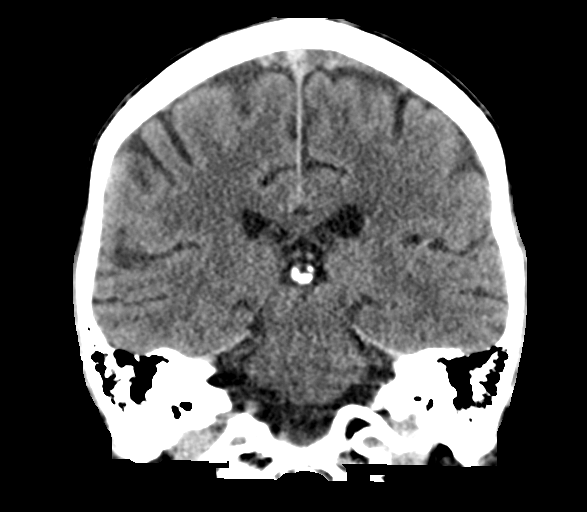
[im 37/67  brain]
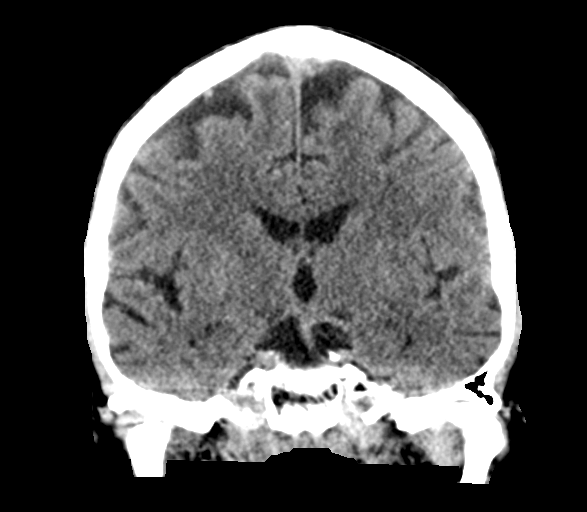

[Series 6: head 3.0 mpr sag · sagittal · 0.33mm/px · 3 of 53 slices shown]
[im 18/53  brain]
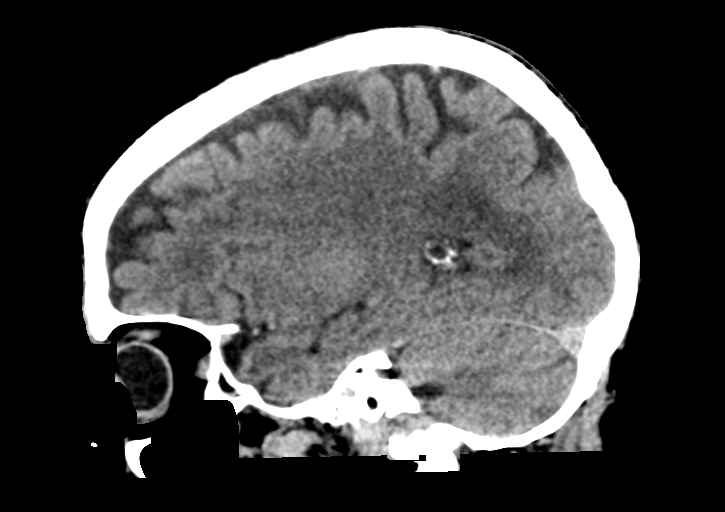
[im 27/53  brain]
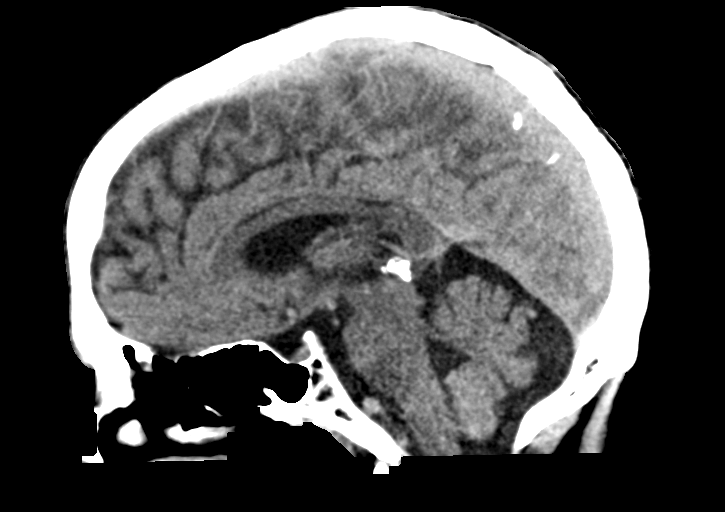
[im 35/53  brain]
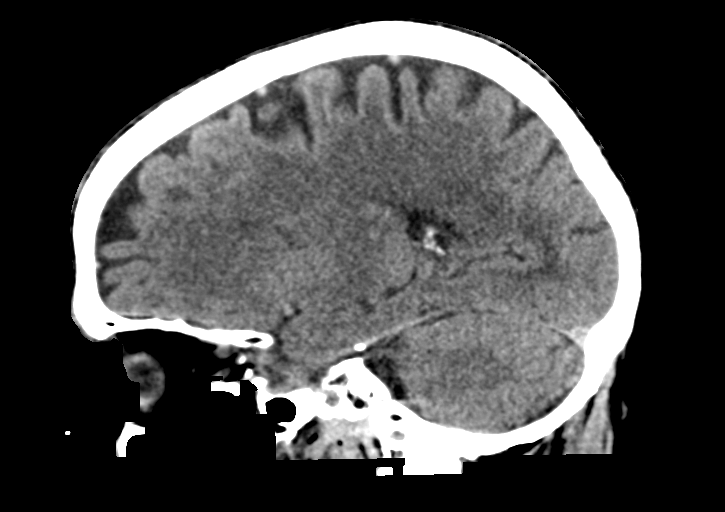

[15 of 47 positions shown; findings below may reference images not displayed]

FINDINGS: Brain: No evidence of acute large vascular territory infarction,
hemorrhage, hydrocephalus, extra-axial collection or mass
lesion/mass effect. Moderate patchy white matter hypoattenuation,
nonspecific but most likely related to chronic microvascular
ischemic disease. Mildly prominent midline retro cerebellar CSF,
likely chronic arachnoid cyst versus mega cisterna magna without
significant mass effect.

Vascular: Calcific atherosclerosis. No hyperdense vessel identified.

Skull: No acute fracture.

Sinuses/Orbits: Mild left maxillary and ethmoid air cell mucosal
thickening without air-fluid levels. Unremarkable orbits.

Other: No mastoid effusions. Cerumen in bilateral external auditory
canals.
IMPRESSION: 1. No evidence of acute intracranial abnormality.
2. Moderate presumed chronic microvascular ischemic disease.

## 2021-04-07 MED ORDER — SODIUM CHLORIDE 0.9 % IV BOLUS
1000.0000 mL | Freq: Once | INTRAVENOUS | Status: AC
  Administered 2021-04-07: 1000 mL via INTRAVENOUS

## 2021-04-07 MED ORDER — LACTATED RINGERS IV BOLUS: 1000.0000 mL | Freq: Once | INTRAVENOUS | Status: DC

## 2021-04-07 NOTE — ED Notes (Signed)
Orthostatic VS Lying BP 164/88 PR 96 Sitting BP 177/96 PR 67 Standing 0 Min BP 121/107 PR 95 Standing 3 Min BP 106/82 PR 120  PA Peter informed

## 2021-04-07 NOTE — ED Triage Notes (Signed)
Pt here with c/o gen weakness while working , near syncopal, alert and oriented on arrival

## 2021-04-07 NOTE — ED Provider Notes (Signed)
[facility] EMERGENCY DEPARTMENT Provider Note   CSN: [id number] Arrival date & time: 04/07/21  1442     History No chief complaint on file.   Ronald Lowe is a 65 y.o. male hypertension, emphysema, orthostatic hypotension, alcohol use disorder, tobacco dependence.  Patient presents with a chief complaint of generalized weakness.  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  Patient reports that weakness was in his arms and legs bilaterally.  Patient reports he was working at approximately 1230 in the longer he worked more week he felt.  Patient reports that his "knees went out on him," causing him to sit down in the bucket.  Patient denies any falls, traumatic injuries, or syncopal episodes.  Patient stated after this when he went to stand up he began to feel dizzy.    He denied any fever, chills, visual disturbance, shortness of breath, chest pain, palpitations, abdominal pain, nausea, vomiting, difficulty urinating or back pain, neck pain, facial asymmetry, slurred speech, syncope.  Patient endorses nightly alcohol consumption of approximately 2-6 beers.  Patient endorses regular marijuana use.  Patient endorses tobacco use with 50-year history.  HPI     Past Medical History:  Diagnosis Date  . Hypertension   . Tobacco abuse     Patient Active Problem List   Diagnosis Date Noted  . Emphysema lung ([place]) 03/17/2021  . Positive hepatitis C antibody test 10/02/2019  . Tobacco dependence 09/24/2019  . Alcohol use disorder, mild, abuse 09/24/2019  . Orthostatic hypotension 06/12/2014  . EKG abnormality 06/12/2014  . HTN (hypertension) 06/12/2014    Past Surgical History:  Procedure Laterality Date  . TONSILLECTOMY         Family History  Problem Relation Age of Onset  . Emphysema Father   . CAD Father        CABG  . Stroke Mother   . CAD Brother 59       CABG  . Hypertension  Brother     Social History   Tobacco Use  . Smoking status: Current Every Day Smoker    Packs/day: 0.50    Years: 40.00    Pack years: 20.00    Types: Cigarettes  . Smokeless tobacco: Never Used  Vaping Use  . Vaping Use: Never used  Substance Use Topics  . Alcohol use: Yes    Alcohol/week: 42.0 standard drinks    Types: 28 Cans of beer, 14 Shots of liquor per week  . Drug use: No    Home Medications Prior to Admission medications   Medication Sig Start Date End Date Taking? Authorizing Provider  atorvastatin (LIPITOR) 40 MG tablet Take 1 tablet (40 mg total) by mouth daily. 03/25/21   Nicolette Bang, DO  ADVAIR HFA [phone] MCG/ACT inhaler INHALE 2 PUFFS INTO THE LUNGS TWICE A DAY 03/18/21   Nicolette Bang, DO  albuterol (VENTOLIN HFA) 108 (90 Base) MCG/ACT inhaler Inhale 2 puffs into the lungs every 6 (six) hours as needed for wheezing or shortness of breath. 03/08/21   Nicolette Bang, DO  amLODipine (NORVASC) 5 MG tablet Take 1 tablet (5 mg total) by mouth daily. Dx: I10 09/21/20   Nicolette Bang, DO  lisinopril (ZESTRIL) 5 MG tablet Take 0.5 tablets (2.5 mg total) by mouth daily. 09/21/20   Nicolette Bang, DO  meloxicam (MOBIC) 15 MG tablet Take 1 tablet (15 mg total) by mouth daily.  12/28/20   Nicolette Bang, DO  pantoprazole (PROTONIX) 40 MG tablet Take 1 tablet (40 mg total) by mouth daily. 12/02/20   Nicolette Bang, DO    Allergies    Patient has no known allergies.  Review of Systems   Review of Systems  Constitutional: Negative for chills and fever.  Eyes: Negative for visual disturbance.  Respiratory: Positive for cough (baseline per patient). Negative for shortness of breath.   Cardiovascular: Negative for chest pain.  Gastrointestinal: Negative for abdominal pain, anal bleeding, blood in stool, nausea and vomiting.  Genitourinary: Negative for difficulty urinating, dysuria, flank pain, frequency  and hematuria.  Musculoskeletal: Negative for back pain and neck pain.  Skin: Negative for color change and rash.  Neurological: Positive for dizziness and weakness (Generalized). Negative for tremors, seizures, syncope, facial asymmetry, speech difficulty, light-headedness, numbness and headaches.  Psychiatric/Behavioral: Negative for confusion.    Physical Exam Updated Vital Signs BP 104/84 (BP Location: Right Arm)   Pulse (!) 117   Temp 97.7 F (36.5 C) (Oral)   Resp 18   SpO2 99%   Physical Exam Vitals and nursing note reviewed.  Constitutional:      General: He is not in acute distress.    Appearance: He is not ill-appearing, toxic-appearing or diaphoretic.  HENT:     Head: Normocephalic and atraumatic. No raccoon eyes, abrasion, masses, right periorbital erythema or left periorbital erythema. Hair is normal.     Jaw: No trismus or pain on movement.     Mouth/Throat:     Dentition: Abnormal dentition.     Pharynx: Oropharynx is clear. Uvula midline. No pharyngeal swelling, oropharyngeal exudate, posterior oropharyngeal erythema or uvula swelling.     Comments: Patient has no teeth Eyes:     General: No scleral icterus.       Right eye: No discharge.        Left eye: No discharge.     Extraocular Movements: Extraocular movements intact.     Pupils: Pupils are equal, round, and reactive to light.  Cardiovascular:     Rate and Rhythm: Normal rate.  Pulmonary:     Effort: Pulmonary effort is normal. No tachypnea, bradypnea or respiratory distress.     Breath sounds: Normal breath sounds. No stridor.  Abdominal:     General: Abdomen is flat.     Palpations: Abdomen is soft. There is no mass or pulsatile mass.     Tenderness: There is no abdominal tenderness. There is no guarding or rebound.  Musculoskeletal:     Cervical back: Normal range of motion and neck supple. No rigidity.     Right lower leg: No swelling or tenderness. No edema.     Left lower leg: No swelling or  tenderness. No edema.  Skin:    General: Skin is warm and dry.     Coloration: Skin is not jaundiced or pale.  Neurological:     General: No focal deficit present.     Mental Status: He is alert.     GCS: GCS eye subscore is 4. GCS verbal subscore is 5. GCS motor subscore is 6.     Cranial Nerves: No cranial nerve deficit or facial asymmetry.     Sensory: Sensation is intact.     Motor: No weakness, tremor, seizure activity or pronator drift.     Coordination: Finger-Nose-Finger Test normal.     Gait: Gait is intact. Gait normal.     Comments: CN II-XII intact, equal grip  strength, +5 strength to bilateral upper and lower extremities     Psychiatric:        Behavior: Behavior is cooperative.     ED Results / Procedures / Treatments   Labs (all labs ordered are listed, but only abnormal results are displayed) Labs Reviewed  CBC - Abnormal; Notable for the following components:      Result Value   WBC 11.3 (*)    MCV 101.7 (*)    MCH 35.1 (*)    All other components within normal limits  DIFFERENTIAL - Abnormal; Notable for the following components:   Neutro Abs 9.0 (*)    Abs Immature Granulocytes 0.08 (*)    All other components within normal limits  COMPREHENSIVE METABOLIC PANEL - Abnormal; Notable for the following components:   Glucose, Bld 138 (*)    Creatinine, Ser 2.09 (*)    AST 55 (*)    Total Bilirubin 1.9 (*)    GFR, Estimated 34 (*)    All other components within normal limits  RAPID URINE DRUG SCREEN, HOSP PERFORMED - Abnormal; Notable for the following components:   Amphetamines POSITIVE (*)    All other components within normal limits  URINALYSIS, ROUTINE W REFLEX MICROSCOPIC - Abnormal; Notable for the following components:   APPearance HAZY (*)    All other components within normal limits  I-STAT CHEM 8, ED - Abnormal; Notable for the following components:   Sodium 134 (*)    Creatinine, Ser 1.90 (*)    Glucose, Bld 135 (*)    Calcium, Ion 1.02 (*)     All other components within normal limits  CBG MONITORING, ED - Abnormal; Notable for the following components:   Glucose-Capillary 175 (*)    All other components within normal limits  ETHANOL  PROTIME-INR  APTT  CK    EKG EKG Interpretation  Date/Time:  Wednesday April 07 2021 15:21:58 EDT Ventricular Rate:  107 PR Interval:  148 QRS Duration: 100 QT Interval:  364 QTC Calculation: 486 R Axis:   83 Text Interpretation: Sinus tachycardia Borderline right axis deviation Borderline prolonged QT interval when compared to prior, faster rate. No STEMI Confirmed by Antony Blackbird [id number]) on 04/07/2021 4:24:01 PM   Radiology CT HEAD WO CONTRAST  Result Date: 04/07/2021 CLINICAL DATA:  Mental status change. EXAM: CT HEAD WITHOUT CONTRAST TECHNIQUE: Contiguous axial images were obtained from the base of the skull through the vertex without intravenous contrast. COMPARISON:  None. FINDINGS: Brain: No evidence of acute large vascular territory infarction, hemorrhage, hydrocephalus, extra-axial collection or mass lesion/mass effect. Moderate patchy white matter hypoattenuation, nonspecific but most likely related to chronic microvascular ischemic disease. Mildly prominent midline retro cerebellar CSF, likely chronic arachnoid cyst versus mega cisterna magna without significant mass effect. Vascular: Calcific atherosclerosis. No hyperdense vessel identified. Skull: No acute fracture. Sinuses/Orbits: Mild left maxillary and ethmoid air cell mucosal thickening without air-fluid levels. Unremarkable orbits. Other: No mastoid effusions. Cerumen in bilateral external auditory canals. IMPRESSION: 1. No evidence of acute intracranial abnormality. 2. Moderate presumed chronic microvascular ischemic disease. Electronically Signed   By: Margaretha Sheffield MD   On: 04/07/2021 16:19    Procedures Procedures   Medications Ordered in ED Medications  sodium chloride 0.9 % bolus 1,000 mL (0 mLs Intravenous  Stopped 04/07/21 1738)  sodium chloride 0.9 % bolus 1,000 mL (0 mLs Intravenous Stopped 04/07/21 2028)    ED Course  I have reviewed the triage vital signs and the nursing notes.  Pertinent  labs & imaging results that were available during my care of the patient were reviewed by me and considered in my medical decision making (see chart for details).    MDM Rules/Calculators/A&P                          Alert 65 year old male no acute distress, nontoxic-appearing.  Patient presents with chief complaint of generalized weakness and near syncopal episode.  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  Patient reports the longer he works the week or he became.  Patient reports he had a near syncopal episode where his "legs gave way."  Patient denies losing consciousness, falling or any traumatic injuries.  On physical exam patient has no focal neurological deficit.  Patient is able to stand and ambulate without difficulty.  Patient does report feeling dizzy when standing.  CBG, CMP, CBC, ethanol, APTT, INR, urinalysis, urine drug screen, EKG, CT head without contrast ordered while patient was in triage.  Will add CK to evaluate for possible rhabdo.  Will obtain orthostatic vital signs.  We will give patient 1 L fluid bolus.  CBG 175.  CT head without contrast showed no evidence of acute intracranial abnormality.  Moderate presumed chronic microvascular ischemic disease. Low suspicion for CVA as patient denies having any slurred speech, facial asymmetry or focal neurological deficit.  EKG shows sinus tachycardia. CMP shows AKI with creatinine elevated at 2.09.  This is increased from 1.46 on lab work was obtained 7 months prior.   CBC shows slight leukocytosis at 11.3; no signs of anemia. Ethanol less than 10; low suspicion for alcohol intoxication causing patient's symptoms. APTT and INR within normal  limits. CK within normal limits; low suspicion for rhabdo.  Orthostatic vital signs obtained showed orthostatic hypotension.  Will add another 1 L fluid bolus for patient.  On serial reexamination patient continues to have no focal neurological deficits.  Patient reports feeling better after receiving IV fluids.  Patient symptoms likely due to dehydration and orthostatic hypotension.  Patient expresses that he does not wish to remain in the hospital overnight.  Urinalysis and UDS still pending at this time.  If urinalysis shows no signs of infection will discharge patient to follow-up with his primary care provider.  Patient care transferred to Dr.Tegeler at the end of my shift. Patient presentation, ED course, and plan of care discussed with review of all pertinent labs and imaging. Please see his/her note for further details regarding further ED course and disposition.   Final Clinical Impression(s) / ED Diagnoses Final diagnoses:  Weakness  AKI (acute kidney injury) ([place])  Orthostatic hypotension    Rx / DC Orders ED Discharge Orders    None       Dyann Ruddle 04/07/21 2211    Tegeler, Gwenyth Allegra, MD 04/08/21 [phone]

## 2021-04-07 NOTE — ED Notes (Signed)
Repeat orthostatic VS following 2 L IVF Lying BP 174/98 PR 84 Sitting BP 138/87 PR 79 Standing 0 min BP 105/75 PR 115 Standing 3 min BP 120/78 PR 122  Dr. [name] Ruffing informed.

## 2021-04-07 NOTE — ED Notes (Signed)
Patient transported to CT 

## 2021-04-07 NOTE — ED Provider Notes (Signed)
7:08 PM Care assumed from [facility], [place].  At time of transfer care, patient is awaiting results of urinalysis and reassessment after rehydration.  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  Patient was found to have AKI but otherwise had no evidence of rhabdo.  CT head was completed showing no acute intracranial abnormality and patient has been feeling better after some fluids.  Plan of care is to discuss disposition and if patient is feeling better, likely discharge home with close follow-up.  9:28 PM CK is normal.  Patient is feeling much better after fluids.  He does not want to be admitted for this AKI and would like to go home.   Ronald Lowe, Gwenyth Allegra, MD 04/08/21 [phone]

## 2021-04-07 NOTE — ED Notes (Signed)
Pt pulled out IV. States he is ready to go. Dr. [name] Ruffing informed to come to bedside

## 2021-04-07 NOTE — ED Triage Notes (Signed)
Emergency Medicine Provider Triage Evaluation Note  Ronald Lowe , a 66 y.o. male  was evaluated in triage.  Pt complains of weakness.  Patient was dropped off by a friend who stated that he was weak and not acting like himself and seemed to have some slurred speech.  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  Currently he reports he feels much better.  Denies prior history of stroke.  Patient also reports daily alcohol use.  Review of Systems  Positive: Weakness, slurred speech Negative: Fever, syncope  Physical Exam  BP 104/84 (BP Location: Right Arm)   Pulse (!) 117   Temp 97.7 F (36.5 C) (Oral)   Resp 18   SpO2 99%  Gen:   Awake, no distress   HEENT:  Atraumatic  Resp:  Normal effort  Cardiac:  Normal rate  Abd:   Nondistended, nontender  MSK:   Moves extremities without difficulty Neuro:    Speech is clear, able to follow commands  CN III-XII intact  Normal strength in upper and lower extremities bilaterally including dorsiflexion and plantar flexion,  strong and equal grip strength  Sensation normal to light and sharp touch  Moves extremities without ataxia, coordination intact  Normal finger to nose and rapid alternating movements  No pronator drift    Medical Decision Making  Medically screening exam initiated at 2:46 PM.  Appropriate orders placed.  Patient does not have any focal neurologic deficits currently and is oriented, question whether he may have had TIA, versus near syncopal episode.  Will be bedded immediately for further evaluation.  Clinical Impression  1. Weakness   Jacqlyn Larsen, [place] 04/07/21 1451

## 2021-04-07 NOTE — ED Notes (Signed)
Discharge instructions including follow up care discussed with pt. Pt was able to verbalize understanding with no other questions at this time. Pt to go home with family.

## 2021-04-07 NOTE — Discharge Instructions (Addendum)
You came to the emergency department today to be evaluated for your generalized weakness.  Your symptoms were likely due to orthostatic hypotension resulting from dehydration.  It was found that you have been acute kidney injury.  Please make sure to stay well-hydrated.  You will need to follow-up with your primary care provider for repeat testing.  Get help right away if you: Develop sudden weakness, especially on one side of your face or body. Have chest pain. Have trouble breathing or shortness of breath. Have problems with your vision. Have trouble talking or swallowing. Have trouble standing or walking. Are light-headed or lose consciousness.

## 2021-04-15 ENCOUNTER — Other Ambulatory Visit: Payer: Self-pay | Admitting: Internal Medicine

## 2021-04-15 DIAGNOSIS — I10 Essential (primary) hypertension: Secondary | ICD-10-CM

## 2021-04-16 ENCOUNTER — Other Ambulatory Visit: Payer: Self-pay

## 2021-04-16 ENCOUNTER — Inpatient Hospital Stay: Payer: Medicare Other | Attending: Oncology | Admitting: Oncology

## 2021-04-16 VITALS — BP 133/93 | HR 81 | Temp 97.5°F | Resp 18 | Ht 72.0 in | Wt 151.4 lb

## 2021-04-16 DIAGNOSIS — R059 Cough, unspecified: Secondary | ICD-10-CM | POA: Diagnosis not present

## 2021-04-16 DIAGNOSIS — F17200 Nicotine dependence, unspecified, uncomplicated: Secondary | ICD-10-CM | POA: Diagnosis not present

## 2021-04-16 DIAGNOSIS — N2889 Other specified disorders of kidney and ureter: Secondary | ICD-10-CM | POA: Diagnosis not present

## 2021-04-16 DIAGNOSIS — R062 Wheezing: Secondary | ICD-10-CM | POA: Diagnosis not present

## 2021-04-16 DIAGNOSIS — R590 Localized enlarged lymph nodes: Secondary | ICD-10-CM | POA: Insufficient documentation

## 2021-04-16 NOTE — Progress Notes (Signed)
Hematology and Oncology Follow Up Visit  Ronald Lowe [id number] 11-11-1956 65 y.o. 04/16/2021 9:57 AM Ronald Lowe, DOWallace, Ronald Lowe*   Principle Diagnosis: 65 year old with 2.0 x 1.7 x 1.6 right renal mass diagnosed in September 2021. He he was noted to have inguinal lymphadenopathy with benign pathology on biopsy.   Prior Therapy: He is status post right inguinal lymph node biopsy completed on October 09, 2020.   Current therapy: Not receiving any active treatment.  Interim History: Ronald Lowe returns today for repeat evaluation.  Since the last visit, he reports no major changes in his health.  He does report some occasional respiratory complaints including cough and wheezing.  He has history of heavy smoking and continues to smoke at this time.  He reports that he has cut down some.  He had denies any flank pain, hematuria or dysuria.  He denies any enlarging lymphadenopathy.     Medications: Updated on review. Current Outpatient Medications  Medication Sig Dispense Refill  . atorvastatin (LIPITOR) 40 MG tablet Take 1 tablet (40 mg total) by mouth daily. 90 tablet 3  . ADVAIR HFA 45-21 MCG/ACT inhaler INHALE 2 PUFFS INTO THE LUNGS TWICE A DAY 8 each 12  . albuterol (VENTOLIN HFA) 108 (90 Base) MCG/ACT inhaler Inhale 2 puffs into the lungs every 6 (six) hours as needed for wheezing or shortness of breath. 18 g 1  . amLODipine (NORVASC) 5 MG tablet Take 1 tablet (5 mg total) by mouth daily. Dx: I10 90 tablet 0  . lisinopril (ZESTRIL) 5 MG tablet Take 0.5 tablets (2.5 mg total) by mouth daily. 90 tablet 0  . meloxicam (MOBIC) 15 MG tablet Take 1 tablet (15 mg total) by mouth daily. 30 tablet 0  . pantoprazole (PROTONIX) 40 MG tablet Take 1 tablet (40 mg total) by mouth daily. 90 tablet 1   No current facility-administered medications for this visit.     Allergies: No Known Allergies    Physical Exam:  Blood pressure (!) 133/93, pulse 81, temperature  (!) 97.5 F (36.4 C), temperature source Tympanic, resp. rate 18, height 6' (1.829 m), weight 151 lb 6.4 oz (68.7 kg), SpO2 100 %.   ECOG: 0    General appearance: Alert, awake without any distress. Head: Atraumatic without abnormalities Oropharynx: Without any thrush or ulcers. Eyes: No scleral icterus. Lymph nodes: No lymphadenopathy noted in the cervical, supraclavicular, or axillary nodes Heart:regular rate and rhythm, without any murmurs or gallops.   Lung: Clear to auscultation without any rhonchi, wheezes or dullness to percussion. Abdomin: Soft, nontender without any shifting dullness or ascites. Musculoskeletal: No clubbing or cyanosis. Neurological: No motor or sensory deficits. Skin: No rashes or lesions.     Lab Results: Lab Results  Component Value Date   WBC 11.3 (H) 04/07/2021   HGB 17.0 04/07/2021   HCT 50.0 04/07/2021   MCV 101.7 (H) 04/07/2021   PLT 263 04/07/2021     Chemistry      Component Value Date/Time   NA 134 (L) 04/07/2021 1506   NA 142 09/24/2019 1144   K 3.6 04/07/2021 1506   CL 98 04/07/2021 1506   CO2 25 04/07/2021 1448   BUN 11 04/07/2021 1506   BUN 9 09/24/2019 1144   CREATININE 1.90 (H) 04/07/2021 1506      Component Value Date/Time   CALCIUM 9.5 04/07/2021 1448   ALKPHOS 60 04/07/2021 1448   AST 55 (H) 04/07/2021 1448   ALT 32 04/07/2021 1448   BILITOT  1.9 (H) 04/07/2021 1448   BILITOT 1.0 09/24/2019 1144        Impression and Plan:  65 year old with:  1.  2.0 x 1.7 x 1.6 cm right kidney mass noted in September 2021.     His disease status was updated at this time and management options were reiterated.  He has been evaluated by urology and he is planned to have repeat imaging studies 6 months from his previous scan.  Partial nephrectomy versus radical nephrectomy could be his options moving forward.  Adjuvant immunotherapy could be considered if he has high risk tumor after surgical resection.   2.  Pelvic and  inguinal adenopathy: This is biopsy-proven to be benign at this time.  We will reevaluate in the future with imaging studies.   3.  Dyspnea and cough: CT scan of the chest obtained in March 2022 personally reviewed and showed signs and symptoms of emphysema without any malignancy.  He is advised to decrease smoking.   4.  Follow-up: I am happy to see him in the future as needed at this time.    30  minutes were spent on this visit.  The time was dedicated to reviewing his disease status, treatment options and outlining future plan of care.    Zola Button, MD 4/29/20229:57 AM

## 2021-04-22 ENCOUNTER — Encounter: Payer: Self-pay | Admitting: Internal Medicine

## 2021-04-22 ENCOUNTER — Ambulatory Visit (INDEPENDENT_AMBULATORY_CARE_PROVIDER_SITE_OTHER): Payer: Medicare Other | Admitting: Internal Medicine

## 2021-04-22 ENCOUNTER — Other Ambulatory Visit: Payer: Self-pay

## 2021-04-22 VITALS — BP 102/60 | HR 94 | Ht 72.0 in | Wt 150.0 lb

## 2021-04-22 DIAGNOSIS — F17210 Nicotine dependence, cigarettes, uncomplicated: Secondary | ICD-10-CM | POA: Diagnosis not present

## 2021-04-22 DIAGNOSIS — J449 Chronic obstructive pulmonary disease, unspecified: Secondary | ICD-10-CM

## 2021-04-22 DIAGNOSIS — F17200 Nicotine dependence, unspecified, uncomplicated: Secondary | ICD-10-CM

## 2021-04-22 MED ORDER — INCRUSE ELLIPTA 62.5 MCG/INH IN AEPB: 1.0000 | INHALATION_SPRAY | Freq: Every day | RESPIRATORY_TRACT | 11 refills | Status: DC

## 2021-04-22 NOTE — Patient Instructions (Signed)
The patient should have follow up scheduled with myself in 3 months.   Prior to next visit patient should have: Full set of PFTs - one hour, sometime before the follow appt.   Start incruse once daily one puff Continue advair hfa (purple) 2 puffs twice a day. Gargle after use.  Take the albuterol rescue inhaler every 4 to 6 hours as needed for wheezing or shortness of breath. You can also take it 15 minutes before exercise or exertional activity. Side effects include heart racing or pounding, jitters or anxiety. If you have a history of an irregular heart rhythm, it can make this worse. Can also give some patients a hard time sleeping.  To inhale the aerosol using an inhaler, follow these steps:  1. Remove the protective dust cap from the end of the mouthpiece. If the dust cap was not placed on the mouthpiece, check the mouthpiece for dirt or other objects. Be sure that the canister is fully and firmly inserted in the mouthpiece. 2. If you are using the inhaler for the first time or if you have not used the inhaler in more than 14 days, you will need to prime it. You may also need to prime the inhaler if it has been dropped. Ask your pharmacist or check the manufacturer's information if this happens. To prime the inhaler, shake it well and then press down on the canister 4 times to release 4 sprays into the air, away from your face. Be careful not to get albuterol in your eyes. 3. Shake the inhaler well. 4. Breathe out as completely as possible through your mouth. 4. Hold the canister with the mouthpiece on the bottom, facing you and the canister pointing upward. Place the open end of the mouthpiece into your mouth. Close your lips tightly around the mouthpiece. 6. Breathe in slowly and deeply through the mouthpiece.At the same time, press down once on the container to spray the medication into your mouth. 7. Try to hold your breath for 10 seconds. remove the inhaler, and breathe out slowly. 8.  If you were told to use 2 puffs, wait 1 minute and then repeat steps 3-7. 9. Replace the protective cap on the inhaler. 10. Clean your inhaler regularly. Follow the manufacturer's directions carefully and ask your doctor or pharmacist if you have any questions about cleaning your inhaler.  Check the back of the inhaler to keep track of the total number of doses left on the inhaler.

## 2021-04-22 NOTE — Progress Notes (Signed)
The patient has been prescribed the inhaler incruse. Inhaler technique was demonstrated to patient. The patient subsequently demonstrated correct technique.

## 2021-04-22 NOTE — Progress Notes (Signed)
Ronald Lowe    [id number]    [birth year]  Primary Care Physician:[name], Bayard Beaver, DO  Referring Physician: Nicolette Bang, DO [address] [place],  [place] [address] Reason for Consultation: shortness of breath Date of Consultation: 04/22/2021  Chief complaint:   Chief Complaint  Patient presents with  . Consult    Shortness of breath x 6 months, cough     HPI: Ronald Lowe is a 65 y.o. gentleman with tobacco use disorder who presents with cough and shortness of breath. Worsening over the last 6 months. Never had pneumonia or bronchitis. Never been hospitalized.  Dyspnea is with exertion usually.  Cough is constant with sputum production, whitish, no blood.  Has been started on advair and albuterol for about a month, but previous to this was borrowing his wife's inhalers.  He also takes his wife's spiriva which helps him a lot. Takes albuterol about 4 times/day. 2 puffs each time.   He has tried smoking with patches, gum, quit line before.  His wife says a neighbor took chantix and it made him nauseated and she doesn't want him to take that.   No fevers, chills, night sweats or weight loss  Social history:  Occupation: He is retired from [employer], installed signs all around [place] Exposures: lives at home with his wife. Pet cat.  Smoking history: as below. He smoked 1 ppd but is currently smoking 1/3 ppd. Trying to cut back. Both parents worked at [employer].  Social History   Occupational History  . Occupation: retired  Tobacco Use  . Smoking status: Current Every Day Smoker    Packs/day: 1.00    Years: 50.00    Pack years: 50.00    Types: Cigarettes    Start date: 12/19/1970  . Smokeless tobacco: Never Used  Vaping Use  . Vaping Use: Never used  Substance and Sexual Activity  . Alcohol use: Yes    Alcohol/week: 42.0 standard drinks    Types: 28 Cans of beer, 14 Shots of liquor per  week  . Drug use: No  . Sexual activity: Yes    Partners: Female    Birth control/protection: None    Relevant family history:  Family History  Problem Relation Age of Onset  . Emphysema Father   . CAD Father        CABG  . Stroke Mother   . CAD Brother 95       CABG  . Hypertension Brother     Past Medical History:  Diagnosis Date  . Chronic kidney disease   . Hyperlipidemia   . Hypertension   . Tobacco abuse     Past Surgical History:  Procedure Laterality Date  . TONSILLECTOMY       Physical Exam: Blood pressure 102/60, pulse 94, height 6' (1.829 m), weight 150 lb (68 kg), SpO2 96 %. Gen:      No acute distress ENT:  no nasal polyps, mucus membranes moist Lungs:    Diminished bilaterally, no wheezes or crackles CV:         Regular rate and rhythm; no murmurs, rubs, or gallops.  No pedal edema Abd:      + bowel sounds; soft, non-tender; no distension MSK: no acute synovitis of DIP or PIP joints, no mechanics hands.  Skin:      Warm and dry; no rashes Neuro: normal speech, no focal facial asymmetry Psych: alert and  oriented x3, normal mood and affect   Data Reviewed/Medical Decision Making:  Independent interpretation of tests: Imaging: . Review of patient's CT Chest images revealed in March 2022 sabersheat trachea, centrilobular emphysema, mucus in the airways. The patient's images have been independently reviewed by me.    PFTs: None on file No flowsheet data found.  Labs:  Lab Results  Component Value Date   WBC 11.3 (H) 04/07/2021   HGB 17.0 04/07/2021   HCT 50.0 04/07/2021   MCV 101.7 (H) 04/07/2021   PLT 263 04/07/2021   Lab Results  Component Value Date   NA 134 (L) 04/07/2021   K 3.6 04/07/2021   CL 98 04/07/2021   CO2 25 04/07/2021     Immunization status:  Immunization History  Administered Date(s) Administered  . PFIZER(Purple Top)SARS-COV-2 Vaccination 03/20/2020, 04/10/2020  . Pneumococcal Conjugate-13 09/24/2019  . Tdap  09/24/2019    . I reviewed prior external note(s) from PCP, ED visit . I reviewed the result(s) of the labs and imaging as noted above.  . I have ordered PFT  Assessment:  COPD, new diagnosis 2 cm renal mass concerning for renal cell carcinoma Tobacco use disorder  Plan/Recommendations:  Will start incruse inhaler one puff once daily Continue advair 2 puffs BID Continue albuterol prn Will obtain PFTs He is due for LDCT in March 2023 for lung cancer screening  Smoking Cessation Counseling:  1. The patient is an everyday smoker and symptomatic due to the following condition COPD 2. The patient is currentlypre-contemplative in quitting smoking. 3. I advised patient to quit smoking. 4. We identified patient specific barriers to change.  5. I personally spent 22minutes counseling the patient regarding tobacco use disorder. 6. We discussed management of stress and anxiety to help with smoking cessation, when applicable. 7. We discussed nicotine replacement therapy, Wellbutrin, Chantix as possible options. 8. I advised setting a quit date. 9. Follow?up arranged with our office to continue ongoing discussions. 10.Resources given to patient including quit hotline.    We discussed disease management and progression at length today for COPD.     Return to Care: Return in about 3 months (around 07/23/2021).  Lenice Llamas, MD Pulmonary and [place]  CC: Caryl Never*

## 2021-05-04 ENCOUNTER — Ambulatory Visit: Payer: Medicare Other | Admitting: Internal Medicine

## 2021-05-05 ENCOUNTER — Other Ambulatory Visit: Payer: Self-pay

## 2021-05-05 ENCOUNTER — Ambulatory Visit (INDEPENDENT_AMBULATORY_CARE_PROVIDER_SITE_OTHER): Payer: Medicare Other | Admitting: Internal Medicine

## 2021-05-05 ENCOUNTER — Encounter: Payer: Self-pay | Admitting: Internal Medicine

## 2021-05-05 VITALS — BP 131/86 | HR 85 | Temp 97.3°F | Resp 20 | Wt 150.0 lb

## 2021-05-05 DIAGNOSIS — J439 Emphysema, unspecified: Secondary | ICD-10-CM | POA: Diagnosis not present

## 2021-05-05 DIAGNOSIS — I10 Essential (primary) hypertension: Secondary | ICD-10-CM

## 2021-05-05 MED ORDER — ALBUTEROL SULFATE HFA 108 (90 BASE) MCG/ACT IN AERS: 2.0000 | INHALATION_SPRAY | Freq: Four times a day (QID) | RESPIRATORY_TRACT | 1 refills | Status: DC | PRN

## 2021-05-05 NOTE — Progress Notes (Signed)
Follow up  BP- takes BP medication every other day d/t dizziness SHOB- states one inhaler made it difficult for him to breath

## 2021-05-05 NOTE — Progress Notes (Signed)
  Subjective:    Ronald Lowe - 65 y.o. male MRN [id number]  Date of birth: [birth year]  HPI  Ronald Lowe is here for follow up.  Was seen by pulmonology on 5/5. Another inhaler was added. He reports compliance with both daily inhalers. Does not yet have PFTs scheduled.   Chronic HTN Disease Monitoring:  Home BP Monitoring - Does not monitor  Chest pain- no  Dyspnea- no Headache - no  Medications: Amlodipine 10 mg, Lisinopril 5 mg ---takes every other day otherwise gets lightheaded  Compliance- yes Lightheadedness- no  Edema- no    Health Maintenance:  Health Maintenance Due  Topic Date Due  . COVID-19 Vaccine (3 - Booster for Pfizer series) 09/10/2020  . PNA vac Low Risk Adult (2 of 2 - PPSV23) 09/23/2020    -  reports that he has been smoking cigarettes. He started smoking about 50 years ago. He has a 50.00 pack-year smoking history. He has never used smokeless tobacco. - Review of Systems: Per HPI. - Past Medical History: Patient Active Problem List   Diagnosis Date Noted  . Emphysema lung ([place]) 03/17/2021  . Positive hepatitis C antibody test 10/02/2019  . Tobacco dependence 09/24/2019  . Alcohol use disorder, mild, abuse 09/24/2019  . Orthostatic hypotension 06/12/2014  . EKG abnormality 06/12/2014  . HTN (hypertension) 06/12/2014   - Medications: reviewed and updated   Objective:   Physical Exam BP 131/86 (BP Location: Right Arm, Patient Position: Sitting, Cuff Size: Normal)   Pulse 85   Temp (!) 97.3 F (36.3 C)   Resp 20   Wt 150 lb (68 kg)   SpO2 97%   BMI 20.34 kg/m  Physical Exam Constitutional:      General: He is not in acute distress.    Appearance: He is not diaphoretic.  Cardiovascular:     Rate and Rhythm: Normal rate.  Pulmonary:     Effort: Pulmonary effort is normal. No respiratory distress.  Musculoskeletal:        General: Normal range of motion.  Skin:    General: Skin is warm and dry.  Neurological:      Mental Status: He is alert and oriented to person, place, and time.  Psychiatric:        Mood and Affect: Affect normal.        Judgment: Judgment normal.            Assessment & Plan:   1. Hypertension, unspecified type BP well controlled. Asymptomatic. Continue current regimen.   2. Pulmonary emphysema, unspecified emphysema type ([place]) Continue Advair and Incruse. Provided refill for rescue inhaler Albuterol. Discussed need to f/u with pulm and purpose of PFTs.  - albuterol (VENTOLIN HFA) 108 (90 Base) MCG/ACT inhaler; Inhale 2 puffs into the lungs every 6 (six) hours as needed for wheezing or shortness of breath.  Dispense: 18 g; Refill: [place], D.O. 05/05/2021, 10:16 AM Primary Care at [facility]

## 2021-05-20 ENCOUNTER — Other Ambulatory Visit: Payer: Self-pay | Admitting: Internal Medicine

## 2021-05-20 DIAGNOSIS — I10 Essential (primary) hypertension: Secondary | ICD-10-CM

## 2021-05-24 ENCOUNTER — Telehealth: Payer: Self-pay | Admitting: Internal Medicine

## 2021-05-24 NOTE — Telephone Encounter (Signed)
PT requesting refills for his  ADVAIR [facility] 45-21 MCG/ACT inhaler [[id number]]  And  pantoprazole (PROTONIX) 40 MG tablet [[id number]]   Pharmacy  [id number] [address], [place] [place] [address]  Phone:  [phone] Fax:  [phone]     Please advise and thank you.

## 2021-05-26 ENCOUNTER — Other Ambulatory Visit: Payer: Self-pay | Admitting: Internal Medicine

## 2021-05-26 ENCOUNTER — Ambulatory Visit: Payer: Medicare Other | Admitting: Internal Medicine

## 2021-05-26 ENCOUNTER — Other Ambulatory Visit: Payer: Self-pay | Admitting: *Deleted

## 2021-05-26 DIAGNOSIS — R058 Other specified cough: Secondary | ICD-10-CM

## 2021-05-26 DIAGNOSIS — Z87891 Personal history of nicotine dependence: Secondary | ICD-10-CM

## 2021-05-26 MED ORDER — ADVAIR HFA 45-21 MCG/ACT IN AERO: INHALATION_SPRAY | RESPIRATORY_TRACT | 12 refills | Status: DC

## 2021-05-26 MED ORDER — PANTOPRAZOLE SODIUM 40 MG PO TBEC: 40.0000 mg | DELAYED_RELEASE_TABLET | Freq: Every day | ORAL | 1 refills | Status: DC

## 2021-06-01 ENCOUNTER — Other Ambulatory Visit: Payer: Self-pay

## 2021-06-01 ENCOUNTER — Ambulatory Visit (HOSPITAL_COMMUNITY)
Admission: RE | Admit: 2021-06-01 | Discharge: 2021-06-01 | Disposition: A | Discharge status: Home / Self Care | Payer: Medicare Other | Source: Ambulatory Visit | Attending: Urology | Admitting: Urology

## 2021-06-01 ENCOUNTER — Other Ambulatory Visit (HOSPITAL_COMMUNITY): Payer: Self-pay | Admitting: Urology

## 2021-06-01 DIAGNOSIS — M545 Low back pain, unspecified: Secondary | ICD-10-CM | POA: Diagnosis not present

## 2021-06-01 DIAGNOSIS — C641 Malignant neoplasm of right kidney, except renal pelvis: Secondary | ICD-10-CM

## 2021-06-01 DIAGNOSIS — Z85528 Personal history of other malignant neoplasm of kidney: Secondary | ICD-10-CM | POA: Diagnosis not present

## 2021-06-01 DIAGNOSIS — I700 Atherosclerosis of aorta: Secondary | ICD-10-CM | POA: Diagnosis not present

## 2021-06-01 DIAGNOSIS — R590 Localized enlarged lymph nodes: Secondary | ICD-10-CM | POA: Diagnosis not present

## 2021-06-01 DIAGNOSIS — N2889 Other specified disorders of kidney and ureter: Secondary | ICD-10-CM | POA: Diagnosis not present

## 2021-06-01 IMAGING — DX DG CHEST 2V
2 series · 2 of 2 positions shown · non-contrast
Comparison: Chest x-ray [DATE].

CLINICAL DATA: 64-year-old male with history of right renal cancer.

EXAM:
CHEST - 2 VIEW

[chest pa]
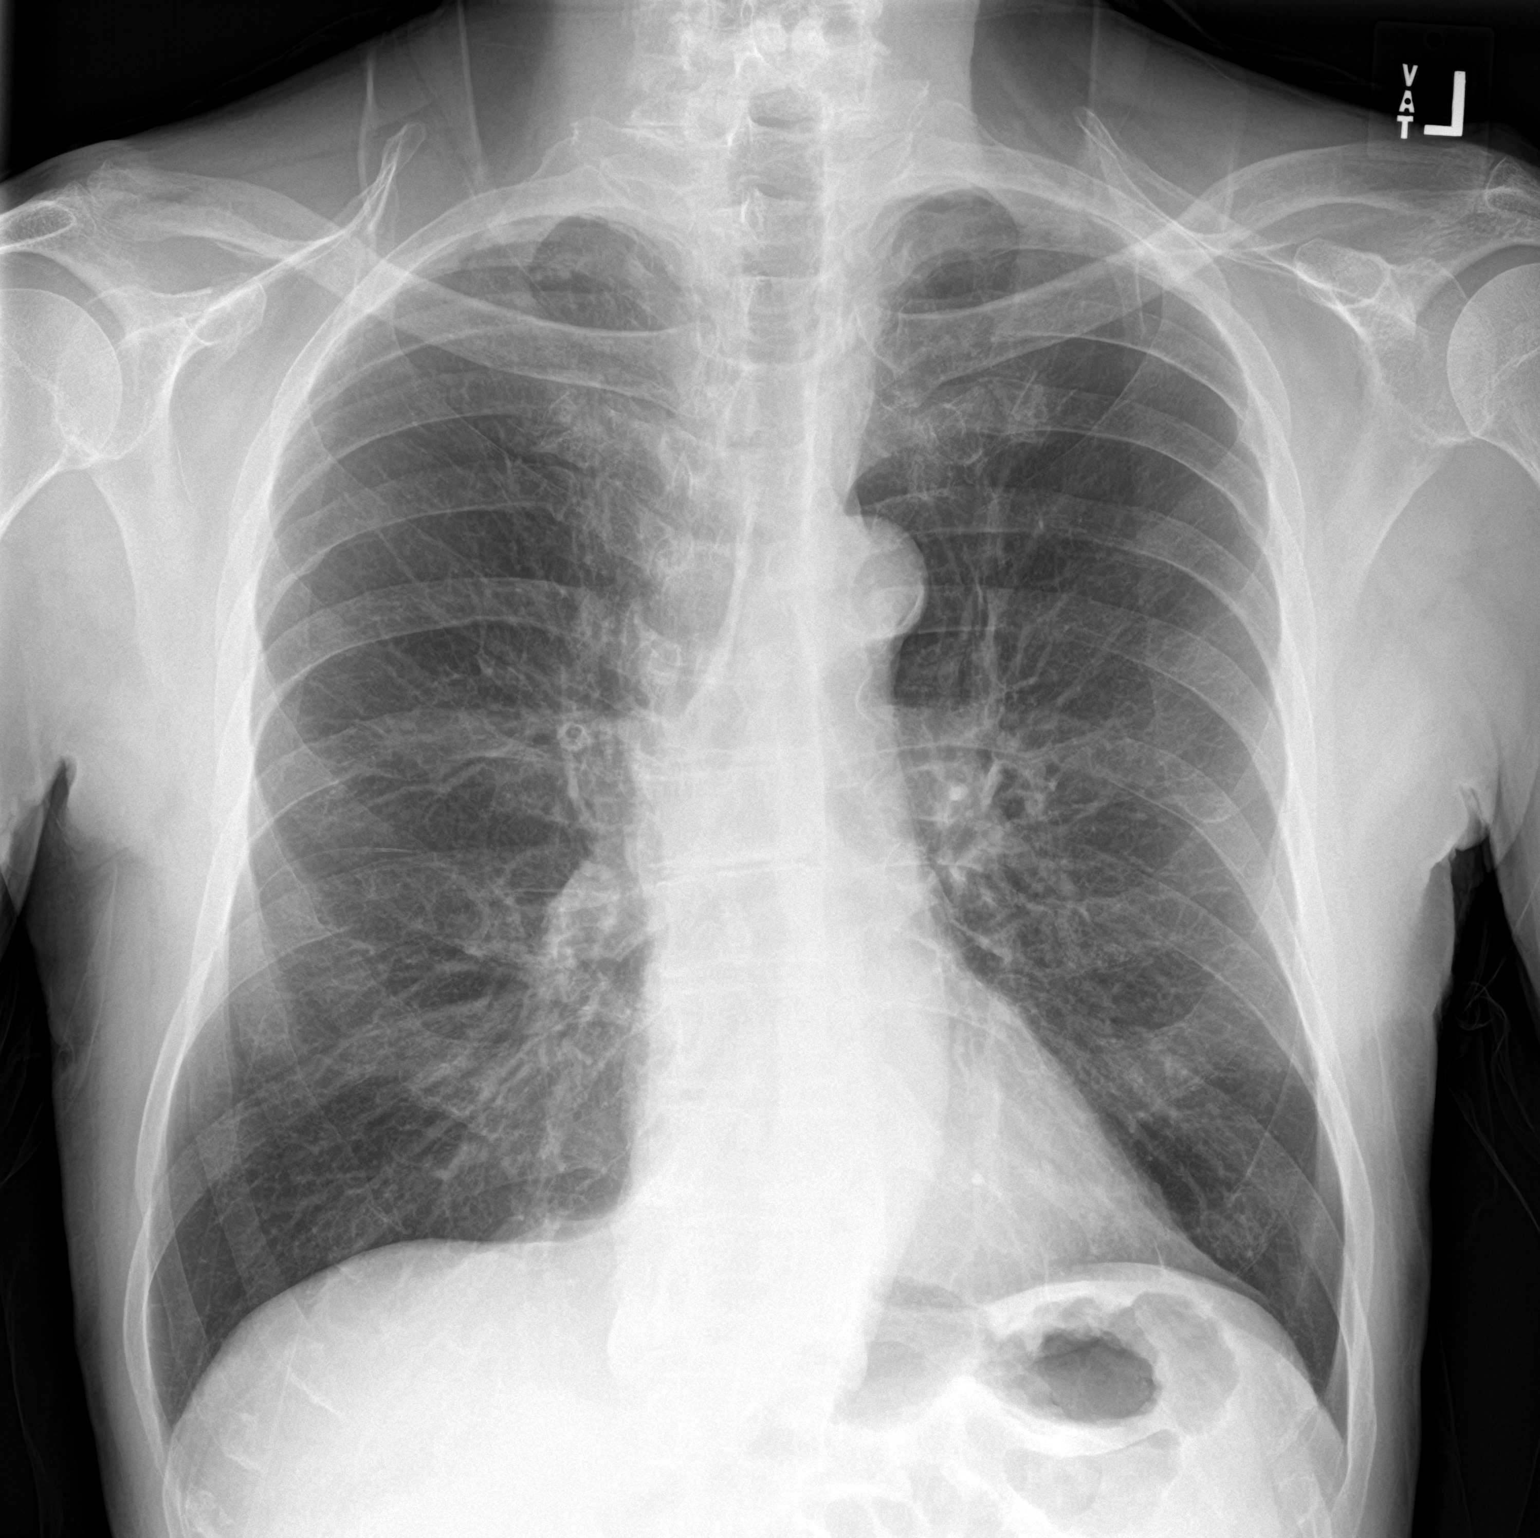

[chest lat]
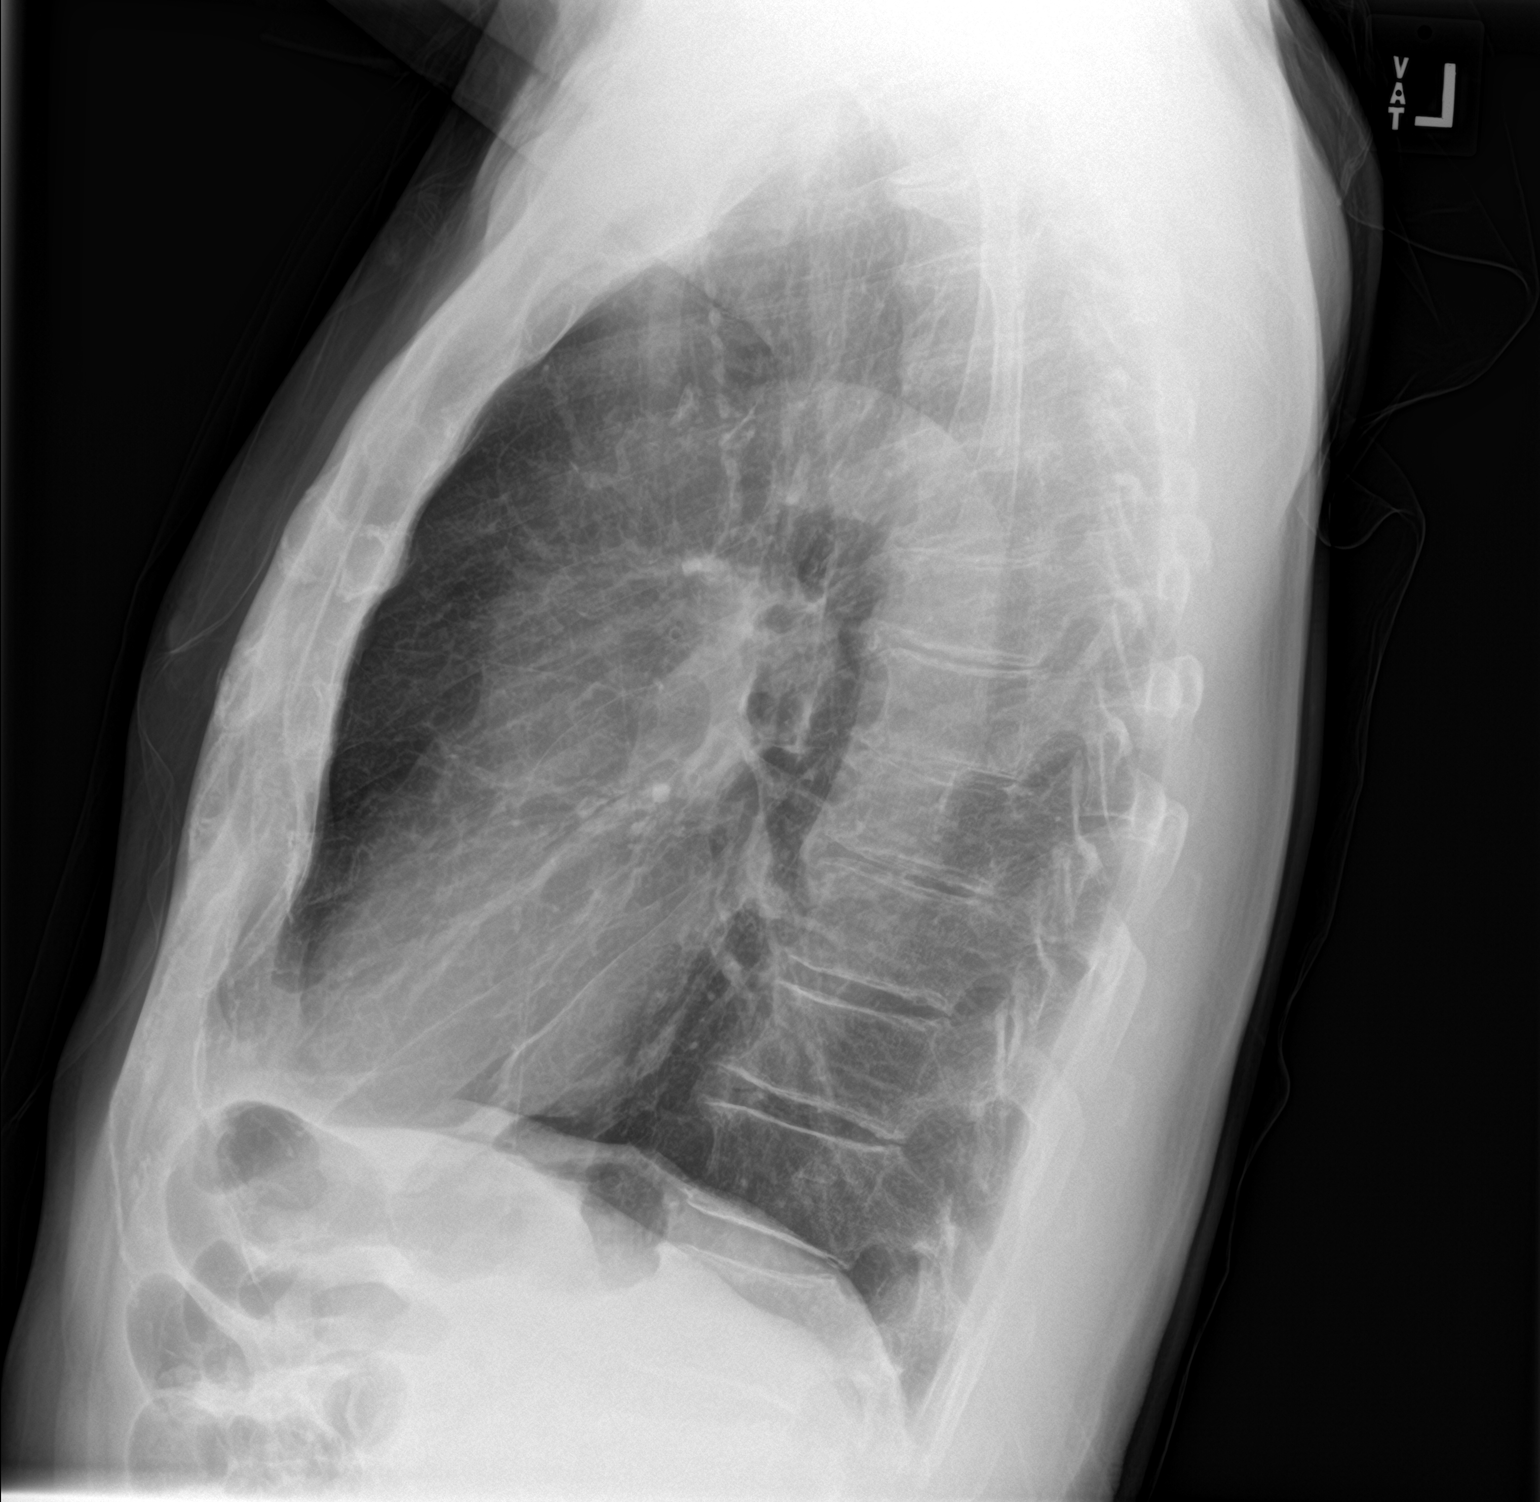

[2 of 2 positions shown; findings below may reference images not displayed]

FINDINGS: Lung volumes are normal. No consolidative airspace disease. No
pleural effusions. No pneumothorax. No pulmonary nodule or mass
noted. Pulmonary vasculature and the cardiomediastinal silhouette
are within normal limits. Atherosclerosis in the thoracic aorta.
IMPRESSION: 1.  No radiographic evidence of acute cardiopulmonary disease.
2. Aortic atherosclerosis.

## 2021-06-13 ENCOUNTER — Encounter (HOSPITAL_COMMUNITY): Payer: Self-pay | Admitting: *Deleted

## 2021-06-13 ENCOUNTER — Emergency Department (HOSPITAL_COMMUNITY): Payer: Medicare Other

## 2021-06-13 ENCOUNTER — Inpatient Hospital Stay (HOSPITAL_COMMUNITY)
Admission: EM | Admit: 2021-06-13 | Discharge: 2021-06-18 | DRG: 896 | Disposition: A | Discharge status: Home / Self Care | Payer: Medicare Other | Attending: Family Medicine | Admitting: Family Medicine

## 2021-06-13 ENCOUNTER — Other Ambulatory Visit: Payer: Self-pay

## 2021-06-13 DIAGNOSIS — J189 Pneumonia, unspecified organism: Secondary | ICD-10-CM | POA: Diagnosis not present

## 2021-06-13 DIAGNOSIS — J439 Emphysema, unspecified: Secondary | ICD-10-CM | POA: Diagnosis present

## 2021-06-13 DIAGNOSIS — E872 Acidosis: Secondary | ICD-10-CM | POA: Diagnosis present

## 2021-06-13 DIAGNOSIS — Z79899 Other long term (current) drug therapy: Secondary | ICD-10-CM

## 2021-06-13 DIAGNOSIS — F10231 Alcohol dependence with withdrawal delirium: Secondary | ICD-10-CM | POA: Diagnosis not present

## 2021-06-13 DIAGNOSIS — R339 Retention of urine, unspecified: Secondary | ICD-10-CM | POA: Diagnosis present

## 2021-06-13 DIAGNOSIS — I959 Hypotension, unspecified: Secondary | ICD-10-CM | POA: Diagnosis present

## 2021-06-13 DIAGNOSIS — E876 Hypokalemia: Secondary | ICD-10-CM | POA: Diagnosis present

## 2021-06-13 DIAGNOSIS — F17200 Nicotine dependence, unspecified, uncomplicated: Secondary | ICD-10-CM | POA: Diagnosis present

## 2021-06-13 DIAGNOSIS — Z8249 Family history of ischemic heart disease and other diseases of the circulatory system: Secondary | ICD-10-CM

## 2021-06-13 DIAGNOSIS — F17210 Nicotine dependence, cigarettes, uncomplicated: Secondary | ICD-10-CM | POA: Diagnosis present

## 2021-06-13 DIAGNOSIS — Y902 Blood alcohol level of 40-59 mg/100 ml: Secondary | ICD-10-CM | POA: Diagnosis present

## 2021-06-13 DIAGNOSIS — Z825 Family history of asthma and other chronic lower respiratory diseases: Secondary | ICD-10-CM

## 2021-06-13 DIAGNOSIS — I4891 Unspecified atrial fibrillation: Secondary | ICD-10-CM | POA: Diagnosis not present

## 2021-06-13 DIAGNOSIS — J441 Chronic obstructive pulmonary disease with (acute) exacerbation: Secondary | ICD-10-CM | POA: Diagnosis not present

## 2021-06-13 DIAGNOSIS — R000 Tachycardia, unspecified: Secondary | ICD-10-CM | POA: Diagnosis not present

## 2021-06-13 DIAGNOSIS — Z7951 Long term (current) use of inhaled steroids: Secondary | ICD-10-CM

## 2021-06-13 DIAGNOSIS — F1290 Cannabis use, unspecified, uncomplicated: Secondary | ICD-10-CM | POA: Diagnosis present

## 2021-06-13 DIAGNOSIS — R1011 Right upper quadrant pain: Secondary | ICD-10-CM | POA: Diagnosis not present

## 2021-06-13 DIAGNOSIS — E785 Hyperlipidemia, unspecified: Secondary | ICD-10-CM | POA: Diagnosis present

## 2021-06-13 DIAGNOSIS — R062 Wheezing: Secondary | ICD-10-CM | POA: Diagnosis not present

## 2021-06-13 DIAGNOSIS — I10 Essential (primary) hypertension: Secondary | ICD-10-CM | POA: Diagnosis present

## 2021-06-13 DIAGNOSIS — I129 Hypertensive chronic kidney disease with stage 1 through stage 4 chronic kidney disease, or unspecified chronic kidney disease: Secondary | ICD-10-CM | POA: Diagnosis present

## 2021-06-13 DIAGNOSIS — F10931 Alcohol use, unspecified with withdrawal delirium: Secondary | ICD-10-CM | POA: Diagnosis present

## 2021-06-13 DIAGNOSIS — E1165 Type 2 diabetes mellitus with hyperglycemia: Secondary | ICD-10-CM | POA: Diagnosis not present

## 2021-06-13 DIAGNOSIS — Z20822 Contact with and (suspected) exposure to covid-19: Secondary | ICD-10-CM | POA: Diagnosis present

## 2021-06-13 DIAGNOSIS — Z823 Family history of stroke: Secondary | ICD-10-CM

## 2021-06-13 DIAGNOSIS — D539 Nutritional anemia, unspecified: Secondary | ICD-10-CM | POA: Diagnosis present

## 2021-06-13 DIAGNOSIS — N179 Acute kidney failure, unspecified: Secondary | ICD-10-CM | POA: Diagnosis present

## 2021-06-13 DIAGNOSIS — Z781 Physical restraint status: Secondary | ICD-10-CM

## 2021-06-13 DIAGNOSIS — R0602 Shortness of breath: Secondary | ICD-10-CM | POA: Diagnosis not present

## 2021-06-13 DIAGNOSIS — F419 Anxiety disorder, unspecified: Secondary | ICD-10-CM | POA: Diagnosis present

## 2021-06-13 LAB — HEPATIC FUNCTION PANEL
ALT: 21 U/L (ref 0–44)
AST: 36 U/L (ref 15–41)
Albumin: 3 g/dL — ABNORMAL LOW (ref 3.5–5.0)
Alkaline Phosphatase: 35 U/L — ABNORMAL LOW (ref 38–126)
Bilirubin, Direct: 0.3 mg/dL — ABNORMAL HIGH (ref 0.0–0.2)
Indirect Bilirubin: 0.8 mg/dL (ref 0.3–0.9)
Total Bilirubin: 1.1 mg/dL (ref 0.3–1.2)
Total Protein: 6.1 g/dL — ABNORMAL LOW (ref 6.5–8.1)

## 2021-06-13 LAB — RESP PANEL BY RT-PCR (FLU A&B, COVID) ARPGX2
Influenza A by PCR: NEGATIVE
Influenza B by PCR: NEGATIVE
SARS Coronavirus 2 by RT PCR: NEGATIVE

## 2021-06-13 LAB — CBC
HCT: 37.9 % — ABNORMAL LOW (ref 39.0–52.0)
Hemoglobin: 13 g/dL (ref 13.0–17.0)
MCH: 35.4 pg — ABNORMAL HIGH (ref 26.0–34.0)
MCHC: 34.3 g/dL (ref 30.0–36.0)
MCV: 103.3 fL — ABNORMAL HIGH (ref 80.0–100.0)
Platelets: 166 10*3/uL (ref 150–400)
RBC: 3.67 MIL/uL — ABNORMAL LOW (ref 4.22–5.81)
RDW: 13.2 % (ref 11.5–15.5)
WBC: 9.1 10*3/uL (ref 4.0–10.5)
nRBC: 0 % (ref 0.0–0.2)

## 2021-06-13 LAB — LACTIC ACID, PLASMA
Lactic Acid, Venous: 7 mmol/L (ref 0.5–1.9)
Lactic Acid, Venous: 3.5 mmol/L (ref 0.5–1.9)

## 2021-06-13 LAB — ETHANOL: Alcohol, Ethyl (B): 55 mg/dL — ABNORMAL HIGH (ref <10)

## 2021-06-13 LAB — BASIC METABOLIC PANEL
Anion gap: 16 — ABNORMAL HIGH (ref 5–15)
BUN: 14 mg/dL (ref 8–23)
CO2: 16 mmol/L — ABNORMAL LOW (ref 22–32)
Calcium: 8 mg/dL — ABNORMAL LOW (ref 8.9–10.3)
Chloride: 99 mmol/L (ref 98–111)
Creatinine, Ser: 1.21 mg/dL (ref 0.61–1.24)
GFR, Estimated: >60 mL/min (ref >60)
Glucose, Bld: 218 mg/dL — ABNORMAL HIGH (ref 70–99)
Potassium: 3.2 mmol/L — ABNORMAL LOW (ref 3.5–5.1)
Sodium: 131 mmol/L — ABNORMAL LOW (ref 135–145)

## 2021-06-13 IMAGING — CR DG CHEST 2V
2 series · 2 of 2 positions shown · non-contrast
Comparison: Chest x-ray dated [DATE].

CLINICAL DATA: Shortness of breath.

EXAM:
CHEST - 2 VIEW

[chest lat]
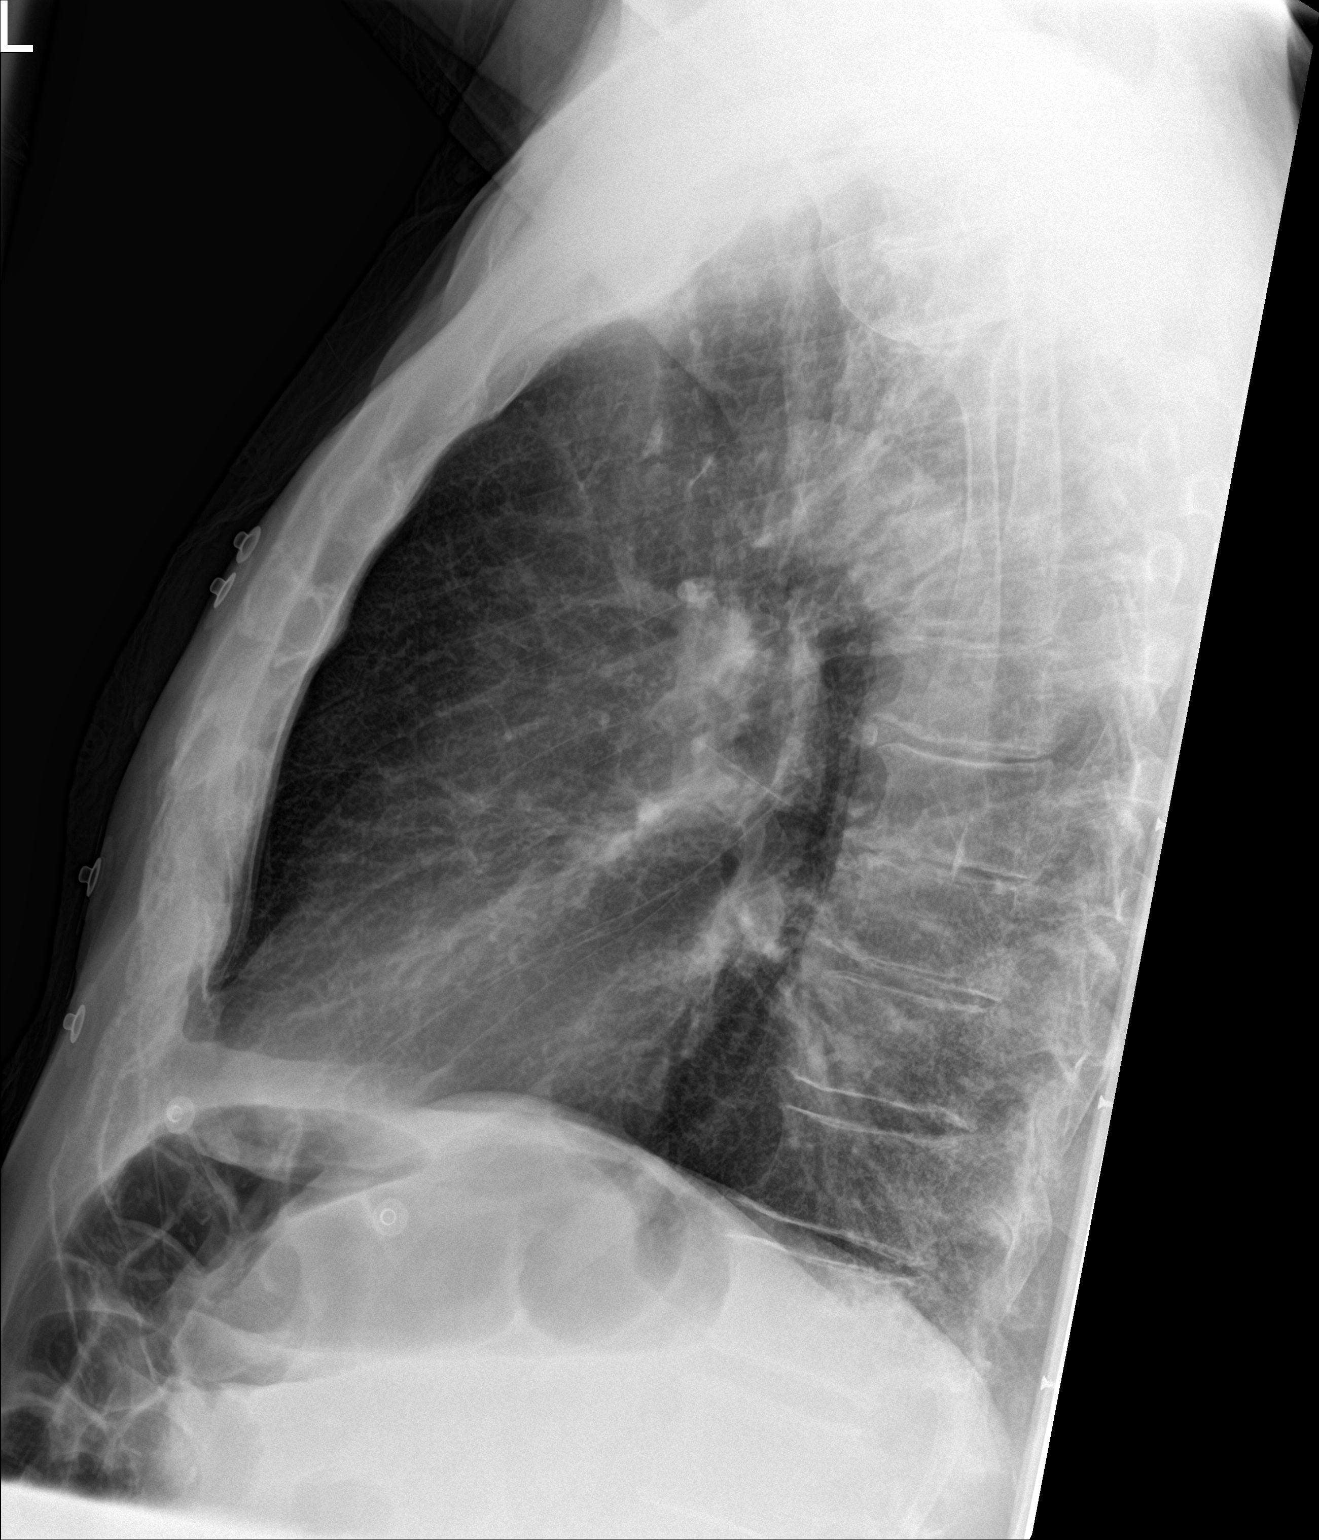

[chest ap]
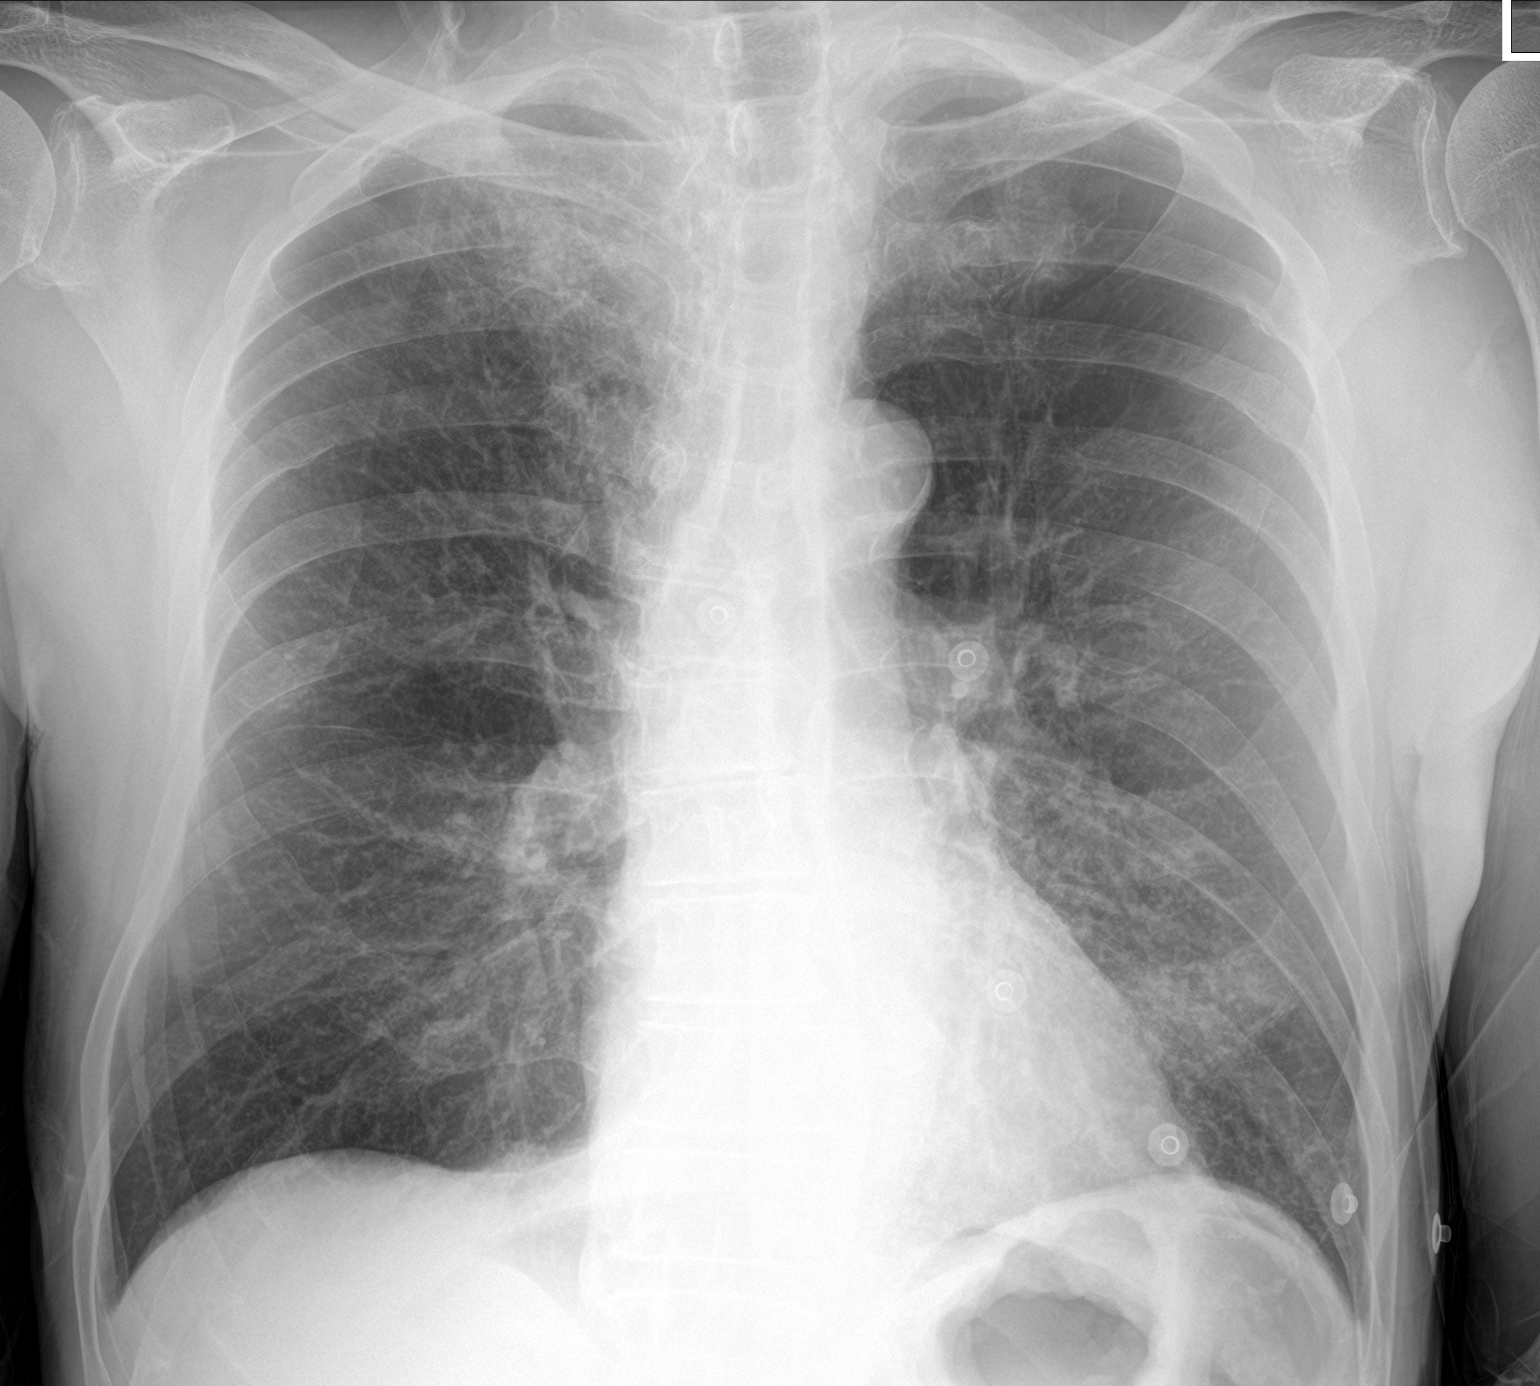

[2 of 2 positions shown; findings below may reference images not displayed]

FINDINGS: The heart size and mediastinal contours are within normal limits.
Normal pulmonary vascularity. The lungs remain hyperinflated. New
reticulonodular opacities in the right upper lobe and left lower
lobe. No pleural effusion or pneumothorax. No acute osseous
abnormality.
IMPRESSION: 1. New reticulonodular opacities in the right upper lobe and left
lower lobe, concerning for multifocal pneumonia.

## 2021-06-13 MED ORDER — ACETAMINOPHEN 325 MG PO TABS
650.0000 mg | ORAL_TABLET | Freq: Four times a day (QID) | ORAL | Status: DC | PRN
  Administered 2021-06-14: 650 mg via ORAL
  Filled 2021-06-13: qty 2

## 2021-06-13 MED ORDER — PANTOPRAZOLE SODIUM 40 MG PO TBEC: 40.0000 mg | DELAYED_RELEASE_TABLET | Freq: Every day | ORAL | Status: DC

## 2021-06-13 MED ORDER — LISINOPRIL 2.5 MG PO TABS: 2.5000 mg | ORAL_TABLET | ORAL | Status: DC

## 2021-06-13 MED ORDER — SODIUM CHLORIDE 0.9 % IV BOLUS
1000.0000 mL | Freq: Once | INTRAVENOUS | Status: AC
  Administered 2021-06-13: 1000 mL via INTRAVENOUS

## 2021-06-13 MED ORDER — ADULT MULTIVITAMIN W/MINERALS CH
1.0000 | ORAL_TABLET | Freq: Every day | ORAL | Status: DC
  Administered 2021-06-16 – 2021-06-18 (×3): 1 via ORAL
  Filled 2021-06-13 (×3): qty 1

## 2021-06-13 MED ORDER — FOLIC ACID 1 MG PO TABS: 1.0000 mg | ORAL_TABLET | Freq: Every day | ORAL | Status: DC

## 2021-06-13 MED ORDER — FLUTICASONE FUROATE-VILANTEROL 100-25 MCG/INH IN AEPB
1.0000 | INHALATION_SPRAY | Freq: Every day | RESPIRATORY_TRACT | Status: DC
  Filled 2021-06-13: qty 28

## 2021-06-13 MED ORDER — AZITHROMYCIN 250 MG PO TABS
500.0000 mg | ORAL_TABLET | Freq: Once | ORAL | Status: AC
  Administered 2021-06-13: 500 mg via ORAL
  Filled 2021-06-13: qty 2

## 2021-06-13 MED ORDER — LORAZEPAM 2 MG/ML IJ SOLN
1.0000 mg | Freq: Once | INTRAMUSCULAR | Status: AC
  Administered 2021-06-13: 1 mg via INTRAVENOUS
  Filled 2021-06-13: qty 1

## 2021-06-13 MED ORDER — UMECLIDINIUM BROMIDE 62.5 MCG/INH IN AEPB
1.0000 | INHALATION_SPRAY | Freq: Every day | RESPIRATORY_TRACT | Status: DC
  Filled 2021-06-13: qty 7

## 2021-06-13 MED ORDER — ALBUTEROL SULFATE (2.5 MG/3ML) 0.083% IN NEBU: 3.0000 mL | INHALATION_SOLUTION | RESPIRATORY_TRACT | Status: DC | PRN

## 2021-06-13 MED ORDER — ATORVASTATIN CALCIUM 40 MG PO TABS
40.0000 mg | ORAL_TABLET | Freq: Every day | ORAL | Status: DC
  Administered 2021-06-16 – 2021-06-18 (×3): 40 mg via ORAL
  Filled 2021-06-13 (×3): qty 1

## 2021-06-13 MED ORDER — THIAMINE HCL 100 MG PO TABS: 100.0000 mg | ORAL_TABLET | Freq: Every day | ORAL | Status: DC

## 2021-06-13 MED ORDER — SODIUM CHLORIDE 0.9 % IV SOLN
1.0000 g | Freq: Once | INTRAVENOUS | Status: AC
  Administered 2021-06-13: 1 g via INTRAVENOUS
  Filled 2021-06-13: qty 10

## 2021-06-13 MED ORDER — IPRATROPIUM-ALBUTEROL 0.5-2.5 (3) MG/3ML IN SOLN: 3.0000 mL | Freq: Once | RESPIRATORY_TRACT | Status: DC

## 2021-06-13 MED ORDER — IPRATROPIUM BROMIDE HFA 17 MCG/ACT IN AERS
2.0000 | INHALATION_SPRAY | Freq: Once | RESPIRATORY_TRACT | Status: AC
  Administered 2021-06-13: 2 via RESPIRATORY_TRACT
  Filled 2021-06-13: qty 12.9

## 2021-06-13 MED ORDER — ACETAMINOPHEN 650 MG RE SUPP: 650.0000 mg | Freq: Four times a day (QID) | RECTAL | Status: DC | PRN

## 2021-06-13 MED ORDER — ENOXAPARIN SODIUM 40 MG/0.4ML IJ SOSY
40.0000 mg | PREFILLED_SYRINGE | INTRAMUSCULAR | Status: DC
  Administered 2021-06-14: 40 mg via SUBCUTANEOUS
  Filled 2021-06-13: qty 0.4

## 2021-06-13 MED ORDER — AMLODIPINE BESYLATE 5 MG PO TABS: 5.0000 mg | ORAL_TABLET | ORAL | Status: DC

## 2021-06-13 MED ORDER — NICOTINE 14 MG/24HR TD PT24
14.0000 mg | MEDICATED_PATCH | Freq: Every day | TRANSDERMAL | Status: DC
  Administered 2021-06-14 – 2021-06-18 (×5): 14 mg via TRANSDERMAL
  Filled 2021-06-13 (×5): qty 1

## 2021-06-13 MED ORDER — METHYLPREDNISOLONE SODIUM SUCC 125 MG IJ SOLR
125.0000 mg | Freq: Once | INTRAMUSCULAR | Status: AC
  Administered 2021-06-13: 125 mg via INTRAVENOUS
  Filled 2021-06-13: qty 2

## 2021-06-13 MED ORDER — POTASSIUM CHLORIDE CRYS ER 20 MEQ PO TBCR
40.0000 meq | EXTENDED_RELEASE_TABLET | Freq: Two times a day (BID) | ORAL | Status: DC
  Administered 2021-06-13: 40 meq via ORAL
  Filled 2021-06-13: qty 2

## 2021-06-13 MED ORDER — ALBUTEROL SULFATE HFA 108 (90 BASE) MCG/ACT IN AERS
4.0000 | INHALATION_SPRAY | Freq: Once | RESPIRATORY_TRACT | Status: AC
  Administered 2021-06-13: 4 via RESPIRATORY_TRACT
  Filled 2021-06-13: qty 6.7

## 2021-06-13 NOTE — H&P (Addendum)
[facility] Admission History and Physical Service Pager: [phone]  Patient name: Ronald Lowe Medical record number: [id number] Date of birth: 1956/04/22 Age: 65 y.o. Gender: male  Primary Care Provider: Nicolette Bang, MD Consultants: None Code Status: Full Code  Preferred Emergency Contact: Darrick Meigs, Wife, [phone]  Chief Complaint: SOB  Assessment and Plan: Ronald Lowe is a 65 y.o. male presenting with SOB. PMH is significant for COPD, Alcohol Use d/o, HLD, HTN, Tobacco Use d/o.     SOB 2/2 to PNA vs COPD Exacerbation Has been going on for past 2 days.  Also had myalgias "all over" body that believes was due to hangover from heavy drinking on night of 6/24.  Endorses increased sputum production and blood tinged sputum. Denies chest pain.  Currently satting >95% on room air.  BP elevated to 160's-170's.  HR- 90's-120's.  WBC normal.  Ethanol is 55.  Lactic acid of 3.5.  Concern for pneumonia given this and chest X-Ray.  Chest X-ray showed new reticulonodular opacities in the right upper lobe and left lower lobe, concerning for multifocal pneumonia. Received dose of Azithromycin and Ceftriaxone in ED.  Blood cultures obtained after antibiotic usage.  Denies any urinary symptoms.  Tachycardia and Hypertension possibly due to Alcohol withdrawal.  Wells score 2.5.  CTA completed due to persistent SOB, tachycardia and tachypnea, no PE. Plan to treat for pneumonia and COPD exacerbation.  Reports chronic productive cough, denies fever or pleuritic chest pain. On Incruse Elipta and Advair HFA scheduled and Albuterol inhaler PRN.  - Admit to FPTS, Attending Dr Erin Hearing    - Vital signs per Unit - follow up A1C, B1 - follow up BCx - CIWA monitoring  with PRN Ativan  - Continue Azithromycin  and Ceftriaxone - Continue steroid course, total 5 days, s/p IV solumedrol x1  - Continuous O2 montioring - cardiac monitoring  - AM EKG    RUQ  Tenderness  Patient endorses right upper quadrant tenderness on abdominal exam.  Given history of chronic alcohol consumption, concern for potential cirrhotic changes.  Chart review shows history of positive hepatitis C testing with negative RNA as of 2020. -Right upper quadrant ultrasound -LFTs   Alcohol Use Disorder Typically has around 4 drinks a day.  Last drink 6/26 prior to reporting to ED. Wife indicated patient had shaking in arms and legs and encouraged him to have drink given concern for withdrawal. Patient reports some shakiness at baseline.  Action tremor observed on Physical exam.  Both patient and wife deny hx of DT.  Initial CIWA is 8.  Received IV Ativan 1 mg x2 in ED.  RUQ Ultrrasound obtained and normal. - CIWA scoring with PRN Ativan - TOC consult for alcohol consumption    Tobacco Use  Patient reports smoking 7 cigarettes per day.  -nicotine patch   HTN On Amlodipine 5 mg every other day and Lisinopril 2.5 mg every other day. Patient consistently hypertensive in ED 101-170/74-101. Denies presence of HA or chest pain. Most recently 152/86  - Continue home Amlodipine, will order for daily while admitted  - Hold Lisinopril in setting of AKI  Mactrocytic Anemia Hgb normal at 13.  MCV Currently elevated at 103.3.  B12 and folate normal. - Start Folate supplementation  Hypokalemia K 3.2.  Repleted with 40 mEq Kdur x2.   - AM BMP  FEN/GI: heart healthy  Prophylaxis: lovenox  Disposition: med-surg  History of Present Illness:  Ronald Lowe is a 65  y.o. male presenting with SOB.   Patient reports that two days ago he hurt his back and then woke up Saturday with difficulty breathing. Pt reports that he has been using his prescribed inhalers including Advair and ProAir. Patient continues to report smoking cigarettes. He reports that today was the first time he used his inhalers and they did not help his breathing. Patient adds that his wife gave him Seroquel and he was  able to sleep. Patient denies sick contacts. Patient reports sometimes feeling lightheaded. He has chronic productive cough. On Saturday, he reports having pain from his head to his feet due to a hangover after over indulging in alcohol. He reports drinking alcohol daily and sometimes he drinks more than other days. He usually has 4 drinks per day and Friday evening he had 8 drinks including margaritas and vodka among other mixed drinks. He reports smoking 7 cigarettes per day usually sharing the pack with his wife. He reports at the most he used to smoke a 1/4 pack per day along with marijuana. Patient smokes marijuana daily.   Review Of Systems: Per HPI with the following additions:   Review of Systems  Constitutional:  Negative for fever.  HENT:  Negative for sneezing and sore throat.   Respiratory:  Positive for cough, shortness of breath and wheezing.   Gastrointestinal:  Negative for abdominal distention, constipation, diarrhea and vomiting.  Genitourinary:  Negative for difficulty urinating, dysuria and hematuria.  Neurological:  Positive for light-headedness.    Patient Active Problem List   Diagnosis Date Noted   Emphysema lung ([place]) 03/17/2021   Positive hepatitis C antibody test 10/02/2019   Tobacco dependence 09/24/2019   Alcohol use disorder, mild, abuse 09/24/2019   Orthostatic hypotension 06/12/2014   EKG abnormality 06/12/2014   HTN (hypertension) 06/12/2014    Past Medical History: Past Medical History:  Diagnosis Date   Chronic kidney disease    Hyperlipidemia    Hypertension    Tobacco abuse     Past Surgical History: Past Surgical History:  Procedure Laterality Date   TONSILLECTOMY      Social History: Social History   Tobacco Use   Smoking status: Every Day    Packs/day: 1.00    Years: 50.00    Pack years: 50.00    Types: Cigarettes    Start date: 12/19/1970   Smokeless tobacco: Never  Vaping Use   Vaping Use: Never used  Substance Use Topics    Alcohol use: Yes    Alcohol/week: 42.0 standard drinks    Types: 28 Cans of beer, 14 Shots of liquor per week    Comment: did not drink yesterday, was shaky today and wife gave him one shot pta   Drug use: No    Family History: Family History  Problem Relation Age of Onset   Emphysema Father    CAD Father        CABG   Stroke Mother    CAD Brother 15       CABG   Hypertension Brother    Allergies and Medications: No Known Allergies No current facility-administered medications on file prior to encounter.   Current Outpatient Medications on File Prior to Encounter  Medication Sig Dispense Refill   albuterol (VENTOLIN HFA) 108 (90 Base) MCG/ACT inhaler Inhale 2 puffs into the lungs every 6 (six) hours as needed for wheezing or shortness of breath. 18 g 1   amLODipine (NORVASC) 5 MG tablet TAKE 1 TABLET (5 MG TOTAL) BY MOUTH  EVERY OTHER DAY. DX: I10 (Patient taking differently: Take 5 mg by mouth every other day.) 90 tablet 1   atorvastatin (LIPITOR) 40 MG tablet Take 1 tablet (40 mg total) by mouth daily. 90 tablet 3   fluticasone-salmeterol (ADVAIR HFA) 45-21 MCG/ACT inhaler INHALE 2 PUFFS INTO THE LUNGS TWICE A DAY (Patient taking differently: Inhale 2 puffs into the lungs 2 (two) times daily.) 8 each 12   lisinopril (ZESTRIL) 5 MG tablet TAKE 0.5 TABLETS (2.5 MG TOTAL) BY MOUTH EVERY OTHER DAY. (Patient taking differently: Take 2.5 mg by mouth every other day.) 23 tablet 5   pantoprazole (PROTONIX) 40 MG tablet Take 1 tablet (40 mg total) by mouth daily. 90 tablet 1   umeclidinium bromide (INCRUSE ELLIPTA) 62.5 MCG/INH AEPB Inhale 1 puff into the lungs daily. 1 each 11    Objective: BP 104/82   Pulse (!) 115   Temp 97.6 F (36.4 C) (Oral)   Resp (!) 23   SpO2 98%   Exam:   Physical Exam Constitutional:      General: He is not in acute distress. HENT:     Head: Normocephalic and atraumatic.  Cardiovascular:     Rate and Rhythm: Normal rate and regular rhythm.      Pulses: Normal pulses.  Pulmonary:     Breath sounds: Examination of the right-middle field reveals rhonchi. Examination of the left-middle field reveals rhonchi. Examination of the right-lower field reveals rhonchi. Examination of the left-lower field reveals rhonchi. Rhonchi present. No decreased breath sounds, wheezing or rales.  Chest:     Chest wall: No deformity or crepitus.  Abdominal:     Tenderness: There is abdominal tenderness.     Comments: Right sided abdominal tenderness  Musculoskeletal:     Right lower leg: No edema.     Left lower leg: No edema.  Skin:    General: Skin is warm.  Neurological:     General: No focal deficit present.     Mental Status: He is alert.     Labs and Imaging: CBC BMET  Recent Labs  Lab 06/13/21 1310  WBC 9.1  HGB 13.0  HCT 37.9*  PLT 166   Recent Labs  Lab 06/13/21 1310  NA 131*  K 3.2*  CL 99  CO2 16*  BUN 14  CREATININE 1.21  GLUCOSE 218*  CALCIUM 8.0*     EKG: Sinus tachycardia   Delora Fuel, MD 06/13/2021, 8:32 PM PGY-1, [place] [facility] pager: [phone], text pages welcome    FPTS Upper-Level Resident Addendum   I have independently interviewed and examined the patient. I have discussed the above with the Dr. Manus Rudd and agree with the documentation. My edits for correction/addition/clarification are included above. Please see any attending notes.   Eulis Foster, MD PGY-2, [facility] 06/14/2021 3:40 AM  FPTS Service pager: [phone] (text pages welcome through [facility])

## 2021-06-13 NOTE — ED Notes (Signed)
Wife [name] can be reached at [phone]. Angie, neighbor and family friend can be reached at [phone]

## 2021-06-13 NOTE — ED Notes (Signed)
Pt had a 18g IV placed in LAC by ems and received the following medications:  125mg  solumedrol 1mg  atrovent 15mg  albuterol 474ml NS  Pt CBG was 317 for ems RR 28 and HR 116.  GCS 15

## 2021-06-13 NOTE — ED Notes (Signed)
Ronald Lowe wife [phone]

## 2021-06-13 NOTE — ED Triage Notes (Addendum)
Pt arrives from home by ems for SOB.  Pt has been sob since last pm and was not able to sleep well.  HX copd and emphysema, wheezing.  Pt had neb by ems.  EMS reports that pt was tachypneic and diaphoretic and initially did not want to come but then reluctantly agreed.  Pt also drinks ETOH heavily but did not drink yesterday due to significant hangover.  Pt was not able to sleep last night so wife gave him half of her seroquel, pt was restless this am so wife gave him one shot prior to EMS arrival.  Denies any CP

## 2021-06-13 NOTE — ED Provider Notes (Signed)
Care assumed from [facility], PA-C at shift change with re-assessment pending.  In brief, this patient is a 65 y.o. M past medical Struve hypertension, COPD, chronic tobacco use who presents for evaluation of shortness of breath.  He reports that since last night, he has had trouble breathing.  He does have a history of COPD.  On EMS arrival, he was given a nebulizer and did have improvement.  He has also had persistent cough.  He has not noted any fevers.  He has been vaccinated for COVID.  No sick contacts that he knows of.  Please see note from previous provider for full history/physical exam.  Patient also daily drinker.  CIWA score was low but was given an Ativan   Physical Exam  BP (!) 169/93   Pulse (!) 114   Temp 97.6 F (36.4 C) (Oral)   Resp (!) 22   SpO2 96%   Physical Exam Mild diffuse expiratory wheezing noted.  Rales noted at bilateral bases.  Mild rhonchi.  ED Course/Procedures     Procedures Results for orders placed or performed during the hospital encounter of 06/13/21 (from the past 24 hour(s))  Ethanol     Status: Abnormal   Collection Time: 06/13/21  1:05 PM  Result Value Ref Range   Alcohol, Ethyl (B) 55 (H) <10 mg/dL  CBC     Status: Abnormal   Collection Time: 06/13/21  1:10 PM  Result Value Ref Range   WBC 9.1 4.0 - 10.5 K/uL   RBC 3.67 (L) 4.22 - 5.81 MIL/uL   Hemoglobin 13.0 13.0 - 17.0 g/dL   HCT 37.9 (L) 39.0 - 52.0 %   MCV 103.3 (H) 80.0 - 100.0 fL   MCH 35.4 (H) 26.0 - 34.0 pg   MCHC 34.3 30.0 - 36.0 g/dL   RDW 13.2 11.5 - 15.5 %   Platelets 166 150 - 400 K/uL   nRBC 0.0 0.0 - 0.2 %  Basic metabolic panel     Status: Abnormal   Collection Time: 06/13/21  1:10 PM  Result Value Ref Range   Sodium 131 (L) 135 - 145 mmol/L   Potassium 3.2 (L) 3.5 - 5.1 mmol/L   Chloride 99 98 - 111 mmol/L   CO2 16 (L) 22 - 32 mmol/L   Glucose, Bld 218 (H) 70 - 99 mg/dL   BUN 14 8 - 23 mg/dL   Creatinine, Ser 1.21 0.61 - 1.24 mg/dL   Calcium 8.0 (L) 8.9 -  10.3 mg/dL   GFR, Estimated >60 >60 mL/min   Anion gap 16 (H) 5 - 15  Resp Panel by RT-PCR (Flu A&B, Covid) Nasopharyngeal Swab     Status: None   Collection Time: 06/13/21  2:00 PM   Specimen: Nasopharyngeal Swab; Nasopharyngeal(NP) swabs in vial transport medium  Result Value Ref Range   SARS Coronavirus 2 by RT PCR NEGATIVE NEGATIVE   Influenza A by PCR NEGATIVE NEGATIVE   Influenza B by PCR NEGATIVE NEGATIVE  Lactic acid, plasma     Status: Abnormal   Collection Time: 06/13/21  2:06 PM  Result Value Ref Range   Lactic Acid, Venous 7.0 (HH) 0.5 - 1.9 mmol/L  Lactic acid, plasma     Status: Abnormal   Collection Time: 06/13/21  4:06 PM  Result Value Ref Range   Lactic Acid, Venous 3.5 (HH) 0.5 - 1.9 mmol/L     MDM   PLAN: Patient with pneumonia here today.  He is not hypoxic.  He is  still having some wheezing.  Will reassess after nebulizers.  If improvement, will discharge home.  Patient given antibiotics here in the ED.  MDM: BMP shows potassium 3.2.  BUN and creatinine within normal limits.  Anion gap is 16.  Alcohol level is 55.  Likely alcohol acidosis.  CBC shows reassuring white blood cell count, hemoglobin.  COVID is negative.  Chest x-ray shows new opacities in the right upper lobe and left lower lobe concerning for multifocal pneumonia.  Patient given IV Rocephin, azithromycin by previous provider.  Lactic acid was 7.  We will repeat it to ensure that this was not a lab error.  Repeat lactic acid was 3.5.    Reevaluation.  Patient was still mild wheezing, tachypnea.  Given lactic acidosis, continued tachypnea, tachycardia we will plan for admission.  Discussed patient with Family Medicine team who accepts patient for admission.   Portions of this note were generated with Lobbyist. Dictation errors may occur despite best attempts at proofreading.   1. COPD exacerbation ([place])   2. Multifocal pneumonia      Desma Mcgregor 06/13/21  2312    Quintella Reichert, MD 06/14/21 2243

## 2021-06-13 NOTE — ED Provider Notes (Signed)
[facility] EMERGENCY DEPARTMENT Provider Note   CSN: [id number] Arrival date & time: 06/13/21  1257     History Chief Complaint  Patient presents with   Shortness of Breath    Ronald Lowe is a 65 y.o. male with pertinent past medical history of hypertension, CKD, emphysema, chronic tobacco use that presents to the emergency department today for shortness of breath.  Patient states that he is having difficulty breathing last night, EMS was called by his wife and patient initially did not want to come but then reluctantly agreed.  Patient states that he has been shortness of breath since last night, EMS did report that he was diaphoretic initially, they did give him a nebulizer and this did slightly improve.  Patient states that he has a persistent ongoing nonproductive cough, thinks this is most likely from tobacco use, states that it has not been any worse.  Patient denies any fevers, sore throat congestion.  States that he is been vaccinated against COVID has not been boosted.  Denies any sick contacts.  Patient states that he feels fine, just having some shortness of breath.  Audible wheezes heard on exam.  Patient denies any chest pain, myalgias nausea vomiting diarrhea.  Patient is not on any oxygen for COPD.  Patient does smoke about a pack per day.  Does also admit to alcohol use, states that he does drink pretty heavily, normally drinks about a couple beers and a couple mixed drinks daily, states that he did not drink yesterday due to being hung over.  However EMS notes that patient given 1 shot prior to EMS arrival.  Patient states that he is been taking his COPD medications as prescribed.  HPI     Past Medical History:  Diagnosis Date   Chronic kidney disease    Hyperlipidemia    Hypertension    Tobacco abuse     Patient Active Problem List   Diagnosis Date Noted   Emphysema lung ([place]) 03/17/2021   Positive hepatitis C antibody test 10/02/2019   Tobacco  dependence 09/24/2019   Alcohol use disorder, mild, abuse 09/24/2019   Orthostatic hypotension 06/12/2014   EKG abnormality 06/12/2014   HTN (hypertension) 06/12/2014    Past Surgical History:  Procedure Laterality Date   TONSILLECTOMY         Family History  Problem Relation Age of Onset   Emphysema Father    CAD Father        CABG   Stroke Mother    CAD Brother 51       CABG   Hypertension Brother     Social History   Tobacco Use   Smoking status: Every Day    Packs/day: 1.00    Years: 50.00    Pack years: 50.00    Types: Cigarettes    Start date: 12/19/1970   Smokeless tobacco: Never  Vaping Use   Vaping Use: Never used  Substance Use Topics   Alcohol use: Yes    Alcohol/week: 42.0 standard drinks    Types: 28 Cans of beer, 14 Shots of liquor per week    Comment: did not drink yesterday, was shaky today and wife gave him one shot pta   Drug use: No    Home Medications Prior to Admission medications   Medication Sig Start Date End Date Taking? Authorizing Provider  albuterol (VENTOLIN HFA) 108 (90 Base) MCG/ACT inhaler Inhale 2 puffs into the lungs every 6 (six) hours as needed for wheezing  or shortness of breath. 05/05/21  Yes Nicolette Bang, MD  amLODipine (NORVASC) 5 MG tablet TAKE 1 TABLET (5 MG TOTAL) BY MOUTH EVERY OTHER DAY. DX: I10 Patient taking differently: Take 5 mg by mouth every other day. 05/20/21  Yes Nicolette Bang, MD  atorvastatin (LIPITOR) 40 MG tablet Take 1 tablet (40 mg total) by mouth daily. 03/25/21  Yes Nicolette Bang, MD  fluticasone-salmeterol (ADVAIR HFA) [phone] MCG/ACT inhaler INHALE 2 PUFFS INTO THE LUNGS TWICE A DAY Patient taking differently: Inhale 2 puffs into the lungs 2 (two) times daily. 05/26/21  Yes Nicolette Bang, MD  lisinopril (ZESTRIL) 5 MG tablet TAKE 0.5 TABLETS (2.5 MG TOTAL) BY MOUTH EVERY OTHER DAY. Patient taking differently: Take 2.5 mg by mouth every other day. 04/16/21  Yes  Nicolette Bang, MD  pantoprazole (PROTONIX) 40 MG tablet Take 1 tablet (40 mg total) by mouth daily. 05/26/21  Yes Nicolette Bang, MD  umeclidinium bromide (INCRUSE ELLIPTA) 62.5 MCG/INH AEPB Inhale 1 puff into the lungs daily. 04/22/21  Yes Spero Geralds, MD    Allergies    Patient has no known allergies.  Review of Systems   Review of Systems  Constitutional:  Negative for chills, diaphoresis, fatigue and fever.  HENT:  Negative for congestion, sore throat and trouble swallowing.   Eyes:  Negative for pain and visual disturbance.  Respiratory:  Positive for cough, shortness of breath and wheezing.   Cardiovascular:  Negative for chest pain, palpitations and leg swelling.  Gastrointestinal:  Negative for abdominal distention, abdominal pain, diarrhea, nausea and vomiting.  Genitourinary:  Negative for difficulty urinating.  Musculoskeletal:  Negative for back pain, neck pain and neck stiffness.  Skin:  Negative for pallor.  Neurological:  Negative for dizziness, speech difficulty, weakness and headaches.  Psychiatric/Behavioral:  Negative for confusion.    Physical Exam Updated Vital Signs BP 125/74 (BP Location: Right Arm)   Pulse 88   Temp 97.6 F (36.4 C) (Oral)   Resp (!) 23   SpO2 96%   Physical Exam Constitutional:      General: He is in acute distress.     Appearance: Normal appearance. He is not ill-appearing, toxic-appearing or diaphoretic.     Comments: Patient with mild respiratory distress, is using accessory muscle use however is able to speak to me in full sentences.  Patient is satting at 98% on room air.    HENT:     Mouth/Throat:     Mouth: Mucous membranes are moist.     Pharynx: Oropharynx is clear.  Eyes:     General: No scleral icterus.    Extraocular Movements: Extraocular movements intact.     Pupils: Pupils are equal, round, and reactive to light.  Cardiovascular:     Rate and Rhythm: Regular rhythm. Tachycardia present.      Pulses: Normal pulses.     Heart sounds: Normal heart sounds.  Pulmonary:     Effort: Tachypnea and accessory muscle usage present. No respiratory distress.     Breath sounds: No stridor. Wheezing and rhonchi present. No rales.     Comments: Audible wheezes heard Chest:     Chest wall: No tenderness.  Abdominal:     General: Abdomen is flat. There is no distension.     Palpations: Abdomen is soft.     Tenderness: There is no abdominal tenderness. There is no guarding or rebound.  Musculoskeletal:        General: No swelling or  tenderness. Normal range of motion.     Cervical back: Normal range of motion and neck supple. No rigidity.     Right lower leg: No edema.     Left lower leg: No edema.  Skin:    General: Skin is warm and dry.     Capillary Refill: Capillary refill takes less than 2 seconds.     Coloration: Skin is not pale.  Neurological:     General: No focal deficit present.     Mental Status: He is alert and oriented to person, place, and time.  Psychiatric:        Mood and Affect: Mood normal.        Behavior: Behavior normal.    ED Results / Procedures / Treatments   Labs (all labs ordered are listed, but only abnormal results are displayed) Labs Reviewed  CBC - Abnormal; Notable for the following components:      Result Value   RBC 3.67 (*)    HCT 37.9 (*)    MCV 103.3 (*)    MCH 35.4 (*)    All other components within normal limits  BASIC METABOLIC PANEL - Abnormal; Notable for the following components:   Sodium 131 (*)    Potassium 3.2 (*)    CO2 16 (*)    Glucose, Bld 218 (*)    Calcium 8.0 (*)    Anion gap 16 (*)    All other components within normal limits  RESP PANEL BY RT-PCR (FLU A&B, COVID) ARPGX2  ETHANOL  LACTIC ACID, PLASMA  LACTIC ACID, PLASMA    EKG None  Radiology DG Chest 2 View  Result Date: 06/13/2021 CLINICAL DATA:  Shortness of breath. EXAM: CHEST - 2 VIEW COMPARISON:  Chest x-ray dated June 01, 2021. FINDINGS: The heart  size and mediastinal contours are within normal limits. Normal pulmonary vascularity. The lungs remain hyperinflated. New reticulonodular opacities in the right upper lobe and left lower lobe. No pleural effusion or pneumothorax. No acute osseous abnormality. IMPRESSION: 1. New reticulonodular opacities in the right upper lobe and left lower lobe, concerning for multifocal pneumonia. Electronically Signed   By: Titus Dubin M.D.   On: 06/13/2021 13:37    Procedures Procedures   Medications Ordered in ED Medications  albuterol (PROVENTIL) (2.5 MG/3ML) 0.083% nebulizer solution 3 mL (has no administration in time range)  cefTRIAXone (ROCEPHIN) 1 g in sodium chloride 0.9 % 100 mL IVPB (has no administration in time range)  azithromycin (ZITHROMAX) tablet 500 mg (has no administration in time range)  ipratropium-albuterol (DUONEB) 0.5-2.5 (3) MG/3ML nebulizer solution 3 mL (has no administration in time range)  albuterol (VENTOLIN HFA) 108 (90 Base) MCG/ACT inhaler 4 puff (4 puffs Inhalation Given 06/13/21 1434)  ipratropium (ATROVENT HFA) inhaler 2 puff (2 puffs Inhalation Given 06/13/21 1434)  methylPREDNISolone sodium succinate (SOLU-MEDROL) 125 mg/2 mL injection 125 mg (125 mg Intravenous Given 06/13/21 1435)  LORazepam (ATIVAN) injection 1 mg (1 mg Intravenous Given 06/13/21 1454)    ED Course  I have reviewed the triage vital signs and the nursing notes.  Pertinent labs & imaging results that were available during my care of the patient were reviewed by me and considered in my medical decision making (see chart for details).    MDM Rules/Calculators/A&P                          Patient presents to the emergency department today for shortness of breath.  Patient is in mild respiratory distress, however not needing oxygen requirement at this time.  Patient is tachypneic and tachycardic, with wheezing heard.  We will treat for COPD at this time.  Chest x-ray interpreted by me does appear as  if patient does have multifocal pneumonia.  EKG interpreted me without any signs of ischemia, interpreted by Dr. [name] Ruffing as well. Pt will need DuoNeb once COVID results.  We will give Atrovent and albuterol and Solu-Medrol this time.  Does not appear septic I think he is tachycardic and tachypneic from COPD exacerbation.  Patient does not have a white count, will obtain lactic acid.  Think is appropriate to start antibiotics at this time.   Per nursing CIWA 8, however patient states that he drank today.  Ethanol pending.  We will give Ativan at this time.  Pt care was handed off to L. Layden PA-C at 330.  Complete history and physical and current plan have been communicated.  Please refer to their note for the remainder of ED care and ultimate disposition.  Plan is for pending labs, if patient gets better after DuoNeb given, patient will be able to be discharged if able to ambulate with normal oxygen.  However if patient is continuously tachypneic, tachycardic with accessory muscle use, patient will need to be admitted with CAP coverage.  Final Clinical Impression(s) / ED Diagnoses Final diagnoses:  COPD exacerbation [facility])  Multifocal pneumonia    Rx / DC Orders ED Discharge Orders     None        Alfredia Client, PA-C 06/13/21 1523    Tegeler, Gwenyth Allegra, MD 06/13/21 [phone]

## 2021-06-14 ENCOUNTER — Observation Stay (HOSPITAL_COMMUNITY): Payer: Medicare Other

## 2021-06-14 ENCOUNTER — Inpatient Hospital Stay (HOSPITAL_COMMUNITY): Payer: Medicare Other

## 2021-06-14 DIAGNOSIS — Z781 Physical restraint status: Secondary | ICD-10-CM | POA: Diagnosis not present

## 2021-06-14 DIAGNOSIS — E785 Hyperlipidemia, unspecified: Secondary | ICD-10-CM | POA: Diagnosis present

## 2021-06-14 DIAGNOSIS — G9340 Encephalopathy, unspecified: Secondary | ICD-10-CM

## 2021-06-14 DIAGNOSIS — J439 Emphysema, unspecified: Secondary | ICD-10-CM | POA: Diagnosis present

## 2021-06-14 DIAGNOSIS — R0602 Shortness of breath: Secondary | ICD-10-CM | POA: Diagnosis not present

## 2021-06-14 DIAGNOSIS — I129 Hypertensive chronic kidney disease with stage 1 through stage 4 chronic kidney disease, or unspecified chronic kidney disease: Secondary | ICD-10-CM | POA: Diagnosis present

## 2021-06-14 DIAGNOSIS — J189 Pneumonia, unspecified organism: Secondary | ICD-10-CM

## 2021-06-14 DIAGNOSIS — F419 Anxiety disorder, unspecified: Secondary | ICD-10-CM | POA: Diagnosis present

## 2021-06-14 DIAGNOSIS — Y902 Blood alcohol level of 40-59 mg/100 ml: Secondary | ICD-10-CM | POA: Diagnosis present

## 2021-06-14 DIAGNOSIS — Z79899 Other long term (current) drug therapy: Secondary | ICD-10-CM | POA: Diagnosis not present

## 2021-06-14 DIAGNOSIS — F17210 Nicotine dependence, cigarettes, uncomplicated: Secondary | ICD-10-CM | POA: Diagnosis present

## 2021-06-14 DIAGNOSIS — F1290 Cannabis use, unspecified, uncomplicated: Secondary | ICD-10-CM | POA: Diagnosis present

## 2021-06-14 DIAGNOSIS — Z823 Family history of stroke: Secondary | ICD-10-CM | POA: Diagnosis not present

## 2021-06-14 DIAGNOSIS — E876 Hypokalemia: Secondary | ICD-10-CM | POA: Diagnosis present

## 2021-06-14 DIAGNOSIS — Z7951 Long term (current) use of inhaled steroids: Secondary | ICD-10-CM | POA: Diagnosis not present

## 2021-06-14 DIAGNOSIS — Z8249 Family history of ischemic heart disease and other diseases of the circulatory system: Secondary | ICD-10-CM | POA: Diagnosis not present

## 2021-06-14 DIAGNOSIS — J441 Chronic obstructive pulmonary disease with (acute) exacerbation: Secondary | ICD-10-CM

## 2021-06-14 DIAGNOSIS — F10231 Alcohol dependence with withdrawal delirium: Secondary | ICD-10-CM | POA: Diagnosis present

## 2021-06-14 DIAGNOSIS — I4891 Unspecified atrial fibrillation: Secondary | ICD-10-CM | POA: Diagnosis not present

## 2021-06-14 DIAGNOSIS — N179 Acute kidney failure, unspecified: Secondary | ICD-10-CM | POA: Diagnosis present

## 2021-06-14 DIAGNOSIS — D539 Nutritional anemia, unspecified: Secondary | ICD-10-CM | POA: Diagnosis present

## 2021-06-14 DIAGNOSIS — Z20822 Contact with and (suspected) exposure to covid-19: Secondary | ICD-10-CM | POA: Diagnosis present

## 2021-06-14 DIAGNOSIS — E872 Acidosis: Secondary | ICD-10-CM | POA: Diagnosis present

## 2021-06-14 DIAGNOSIS — I959 Hypotension, unspecified: Secondary | ICD-10-CM | POA: Diagnosis present

## 2021-06-14 DIAGNOSIS — R1011 Right upper quadrant pain: Secondary | ICD-10-CM | POA: Diagnosis not present

## 2021-06-14 DIAGNOSIS — R339 Retention of urine, unspecified: Secondary | ICD-10-CM | POA: Diagnosis present

## 2021-06-14 DIAGNOSIS — Z825 Family history of asthma and other chronic lower respiratory diseases: Secondary | ICD-10-CM | POA: Diagnosis not present

## 2021-06-14 LAB — LACTIC ACID, PLASMA
Lactic Acid, Venous: 3.2 mmol/L (ref 0.5–1.9)
Lactic Acid, Venous: 2.2 mmol/L (ref 0.5–1.9)

## 2021-06-14 LAB — CBC
HCT: 32.4 % — ABNORMAL LOW (ref 39.0–52.0)
Hemoglobin: 11.6 g/dL — ABNORMAL LOW (ref 13.0–17.0)
MCH: 35.2 pg — ABNORMAL HIGH (ref 26.0–34.0)
MCHC: 35.8 g/dL (ref 30.0–36.0)
MCV: 98.2 fL (ref 80.0–100.0)
Platelets: 164 10*3/uL (ref 150–400)
RBC: 3.3 MIL/uL — ABNORMAL LOW (ref 4.22–5.81)
RDW: 13.2 % (ref 11.5–15.5)
WBC: 7.3 10*3/uL (ref 4.0–10.5)
nRBC: 0 % (ref 0.0–0.2)

## 2021-06-14 LAB — BASIC METABOLIC PANEL
Anion gap: 13 (ref 5–15)
BUN: 16 mg/dL (ref 8–23)
CO2: 20 mmol/L — ABNORMAL LOW (ref 22–32)
Calcium: 8.3 mg/dL — ABNORMAL LOW (ref 8.9–10.3)
Chloride: 98 mmol/L (ref 98–111)
Creatinine, Ser: 1.1 mg/dL (ref 0.61–1.24)
GFR, Estimated: >60 mL/min (ref >60)
Glucose, Bld: 181 mg/dL — ABNORMAL HIGH (ref 70–99)
Potassium: 3.7 mmol/L (ref 3.5–5.1)
Sodium: 131 mmol/L — ABNORMAL LOW (ref 135–145)

## 2021-06-14 LAB — COMPREHENSIVE METABOLIC PANEL
ALT: 20 U/L (ref 0–44)
AST: 29 U/L (ref 15–41)
Albumin: 3 g/dL — ABNORMAL LOW (ref 3.5–5.0)
Alkaline Phosphatase: 34 U/L — ABNORMAL LOW (ref 38–126)
Anion gap: 12 (ref 5–15)
BUN: 19 mg/dL (ref 8–23)
CO2: 20 mmol/L — ABNORMAL LOW (ref 22–32)
Calcium: 8.5 mg/dL — ABNORMAL LOW (ref 8.9–10.3)
Chloride: 103 mmol/L (ref 98–111)
Creatinine, Ser: 0.99 mg/dL (ref 0.61–1.24)
GFR, Estimated: >60 mL/min (ref >60)
Glucose, Bld: 123 mg/dL — ABNORMAL HIGH (ref 70–99)
Potassium: 3.5 mmol/L (ref 3.5–5.1)
Sodium: 135 mmol/L (ref 135–145)
Total Bilirubin: 0.9 mg/dL (ref 0.3–1.2)
Total Protein: 6 g/dL — ABNORMAL LOW (ref 6.5–8.1)

## 2021-06-14 LAB — MRSA NEXT GEN BY PCR, NASAL: MRSA by PCR Next Gen: NOT DETECTED

## 2021-06-14 LAB — VITAMIN B12: Vitamin B-12: 211 pg/mL (ref 180–914)

## 2021-06-14 LAB — GLUCOSE, CAPILLARY
Glucose-Capillary: 118 mg/dL — ABNORMAL HIGH (ref 70–99)
Glucose-Capillary: 133 mg/dL — ABNORMAL HIGH (ref 70–99)

## 2021-06-14 LAB — HIV ANTIBODY (ROUTINE TESTING W REFLEX): HIV Screen 4th Generation wRfx: NONREACTIVE

## 2021-06-14 LAB — FOLATE: Folate: 6.8 ng/mL (ref >5.9)

## 2021-06-14 LAB — MAGNESIUM
Magnesium: 1.3 mg/dL — ABNORMAL LOW (ref 1.7–2.4)
Magnesium: 2.6 mg/dL — ABNORMAL HIGH (ref 1.7–2.4)

## 2021-06-14 LAB — PROCALCITONIN: Procalcitonin: 21.61 ng/mL

## 2021-06-14 IMAGING — US US ABDOMEN LIMITED
1 series · 14 of 25 positions shown · non-contrast
Comparison: [DATE]

CLINICAL DATA: Right upper quadrant pain

EXAM:
ULTRASOUND ABDOMEN LIMITED RIGHT UPPER QUADRANT

[Series 1: us abdomen limited ruq (liver/gb) · 14 of 50 slices shown]
[im 1/50]
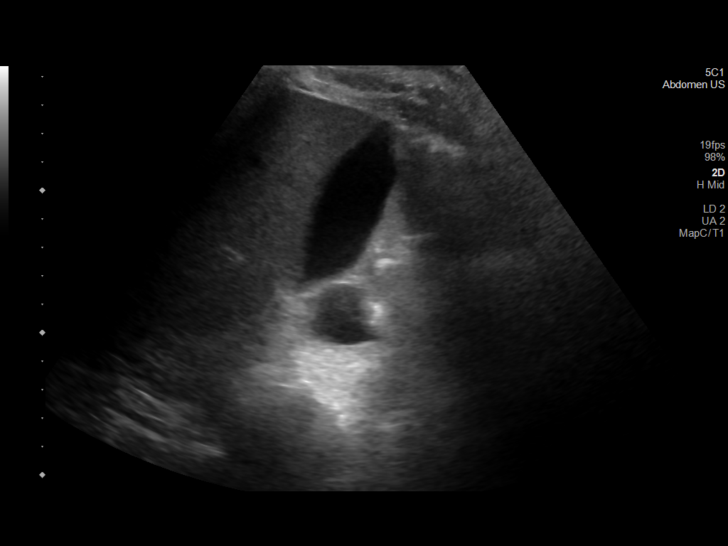
[im 5/50]
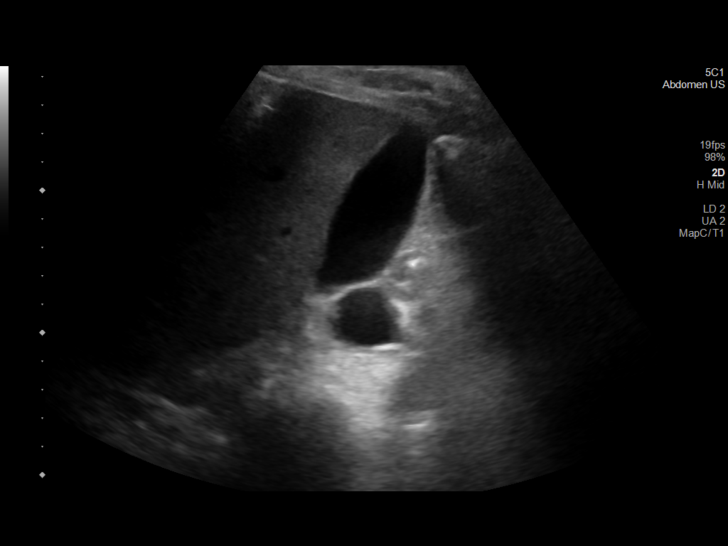
[im 9/50]
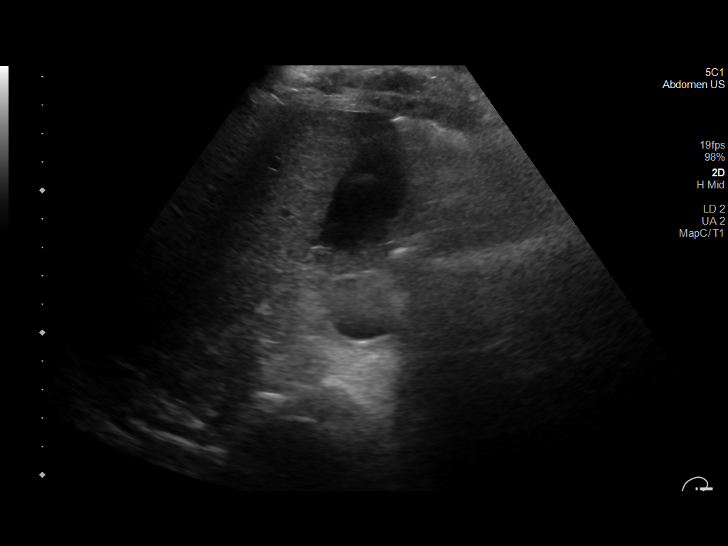
[im 13/50]
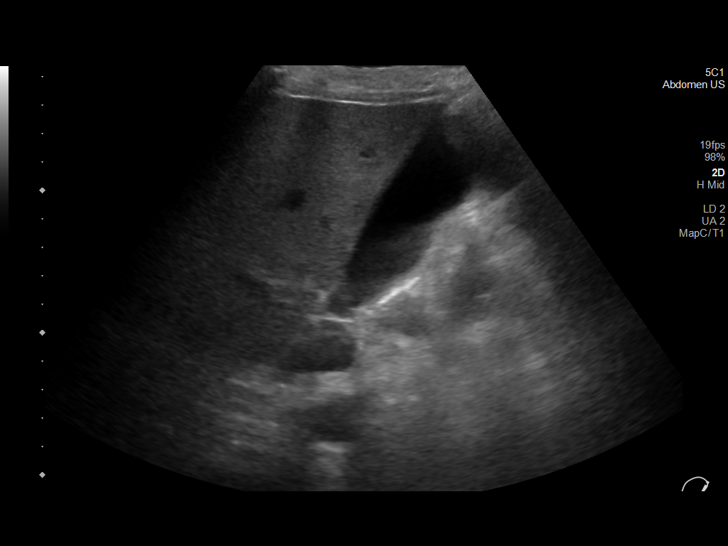
[im 17/50]
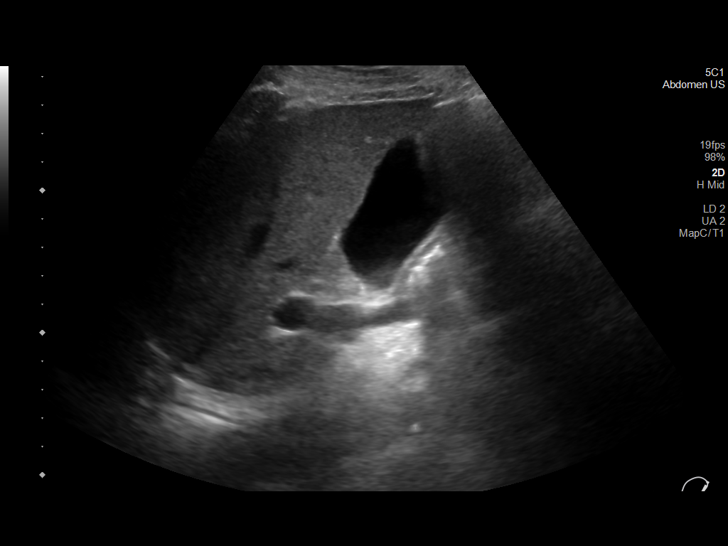
[im 19/50]
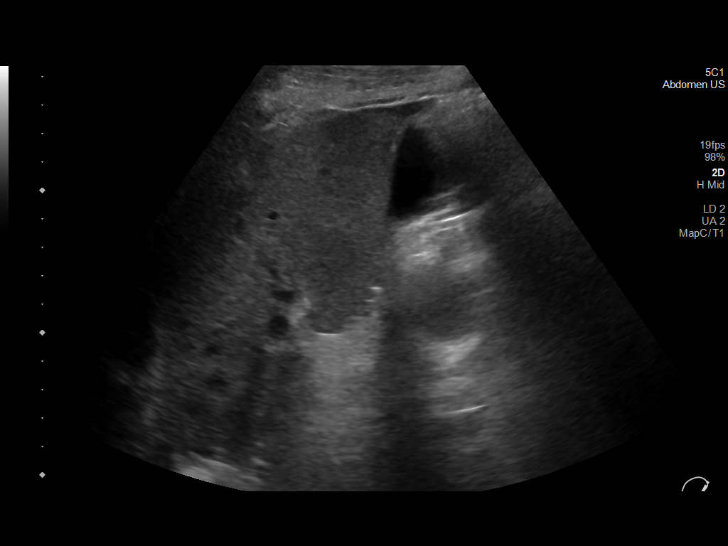
[im 23/50]
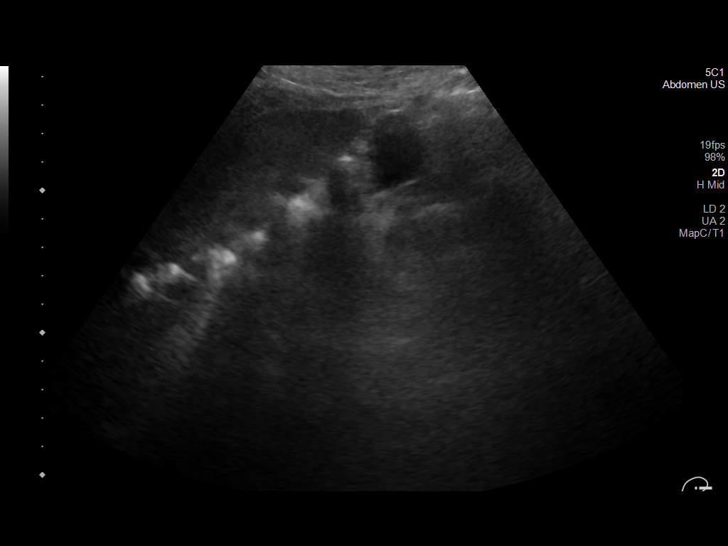
[im 27/50]
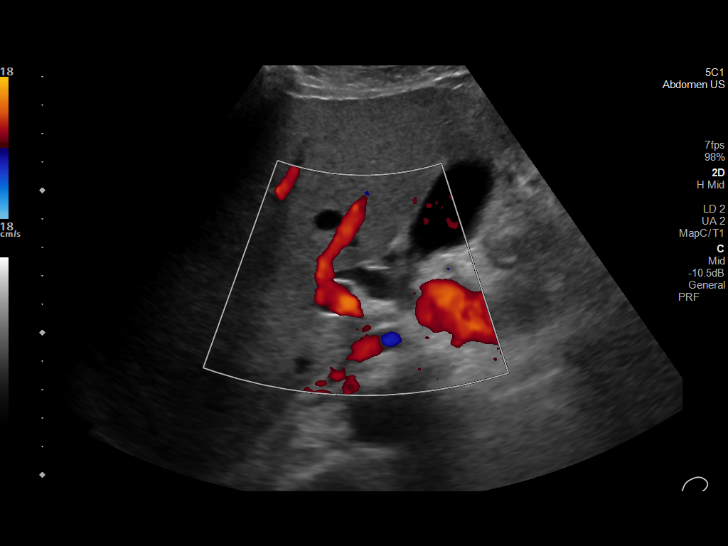
[im 31/50]
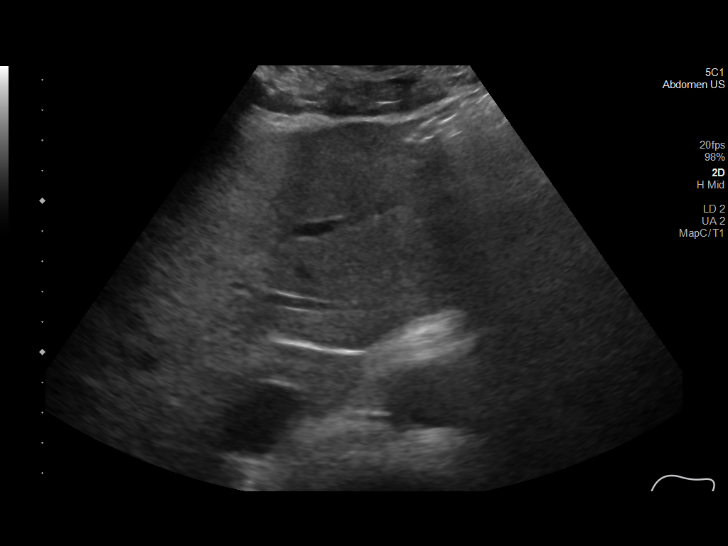
[im 33/50]
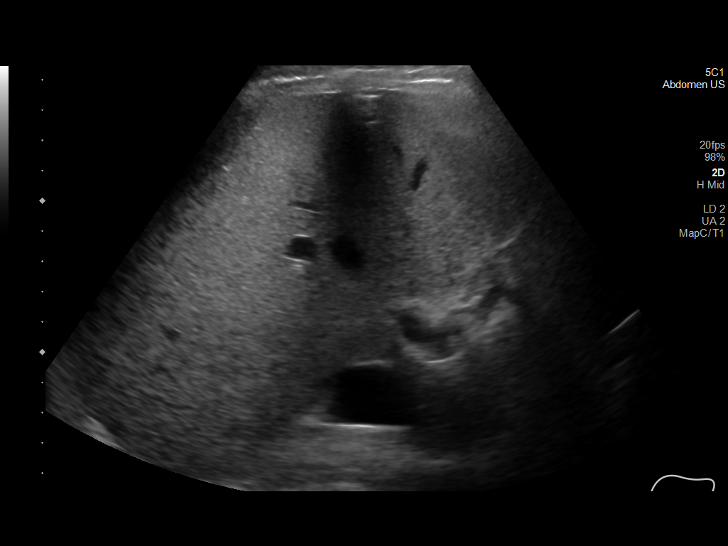
[im 37/50]
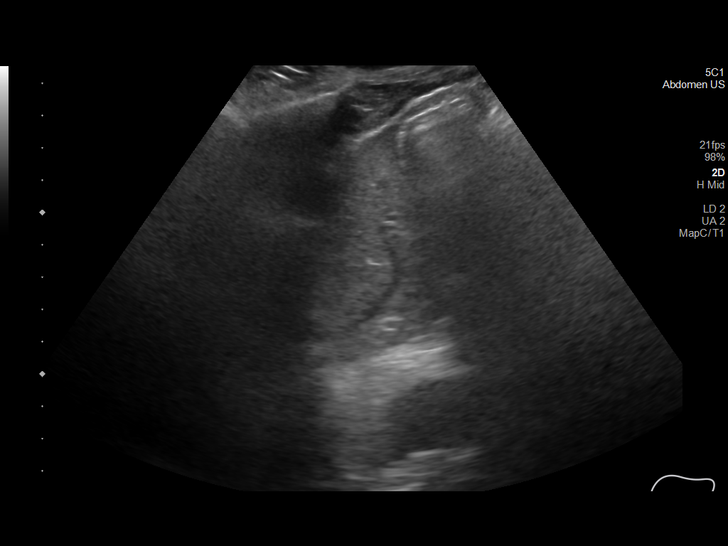
[im 41/50]
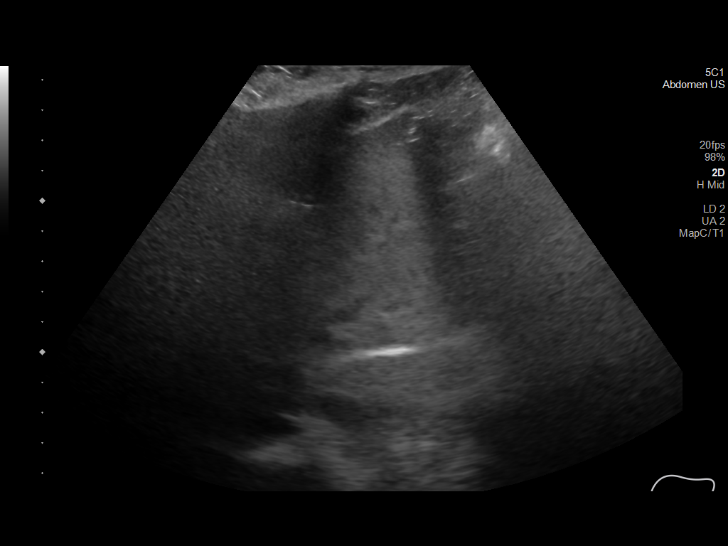
[im 45/50]
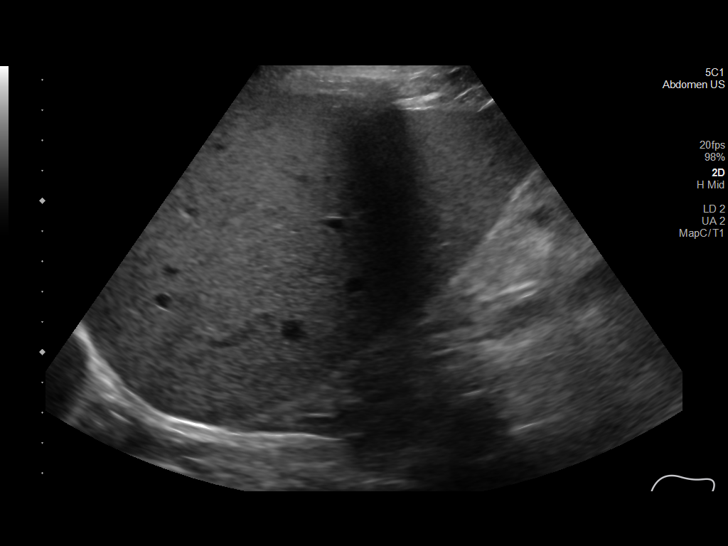
[im 50/50]
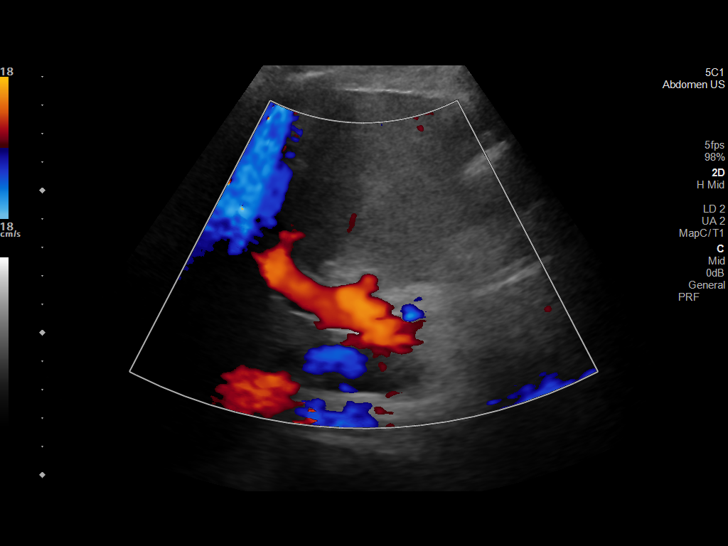

[14 of 25 positions shown; findings below may reference images not displayed]

FINDINGS: Gallbladder:

No gallstones or wall thickening visualized. No sonographic Murphy
sign noted by sonographer.

Common bile duct:

Diameter: Mildly prominent, 8 mm

Liver:

No focal lesion identified. Within normal limits in parenchymal
echogenicity. Portal vein is patent on color Doppler imaging with
normal direction of blood flow towards the liver.

Other: Small right pleural effusion
IMPRESSION: No acute findings in the right upper quadrant.

Small right pleural effusion.

## 2021-06-14 MED ORDER — POTASSIUM CHLORIDE 10 MEQ/100ML IV SOLN
10.0000 meq | INTRAVENOUS | Status: AC
  Administered 2021-06-14 – 2021-06-15 (×4): 10 meq via INTRAVENOUS
  Filled 2021-06-14 (×4): qty 100

## 2021-06-14 MED ORDER — AMIODARONE LOAD VIA INFUSION
150.0000 mg | Freq: Once | INTRAVENOUS | Status: AC
  Administered 2021-06-14: 150 mg via INTRAVENOUS
  Filled 2021-06-14: qty 83.34

## 2021-06-14 MED ORDER — LORAZEPAM 2 MG/ML IJ SOLN
1.0000 mg | INTRAMUSCULAR | Status: DC | PRN
  Administered 2021-06-14: 4 mg via INTRAVENOUS
  Administered 2021-06-14 (×2): 2 mg via INTRAVENOUS
  Administered 2021-06-14: 1 mg via INTRAVENOUS
  Administered 2021-06-14: 2 mg via INTRAVENOUS
  Administered 2021-06-14: 4 mg via INTRAVENOUS
  Filled 2021-06-14 (×3): qty 1
  Filled 2021-06-14: qty 2
  Filled 2021-06-14: qty 1
  Filled 2021-06-14: qty 2
  Filled 2021-06-14: qty 1

## 2021-06-14 MED ORDER — DEXMEDETOMIDINE HCL IN NACL 400 MCG/100ML IV SOLN
0.2000 ug/kg/h | INTRAVENOUS | Status: DC
Start: 2021-06-14 — End: 2021-06-16
  Administered 2021-06-14 – 2021-06-15 (×2): 0.7 ug/kg/h via INTRAVENOUS
  Administered 2021-06-15: 0.2 ug/kg/h via INTRAVENOUS
  Filled 2021-06-14 (×2): qty 100

## 2021-06-14 MED ORDER — IOHEXOL 350 MG/ML SOLN
80.0000 mL | Freq: Once | INTRAVENOUS | Status: AC | PRN
  Administered 2021-06-14: 80 mL via INTRAVENOUS

## 2021-06-14 MED ORDER — PREDNISONE 20 MG PO TABS: 40.0000 mg | ORAL_TABLET | Freq: Every day | ORAL | Status: DC

## 2021-06-14 MED ORDER — MAGNESIUM SULFATE 4 GM/100ML IV SOLN
4.0000 g | Freq: Once | INTRAVENOUS | Status: AC
  Administered 2021-06-14: 4 g via INTRAVENOUS
  Filled 2021-06-14: qty 100

## 2021-06-14 MED ORDER — HYDROXYZINE HCL 25 MG PO TABS
25.0000 mg | ORAL_TABLET | Freq: Four times a day (QID) | ORAL | Status: DC
  Administered 2021-06-14 – 2021-06-17 (×5): 25 mg via ORAL
  Filled 2021-06-14 (×5): qty 1

## 2021-06-14 MED ORDER — LORAZEPAM 2 MG/ML IJ SOLN
2.0000 mg | INTRAMUSCULAR | Status: DC
  Administered 2021-06-14: 2 mg via INTRAVENOUS
  Filled 2021-06-14: qty 1

## 2021-06-14 MED ORDER — LORAZEPAM 1 MG PO TABS: 1.0000 mg | ORAL_TABLET | ORAL | Status: DC | PRN

## 2021-06-14 MED ORDER — LORAZEPAM 2 MG/ML IJ SOLN: 4.0000 mg | INTRAMUSCULAR | Status: DC

## 2021-06-14 MED ORDER — CHLORHEXIDINE GLUCONATE CLOTH 2 % EX PADS
6.0000 | MEDICATED_PAD | Freq: Every day | CUTANEOUS | Status: DC
  Administered 2021-06-14 – 2021-06-18 (×5): 6 via TOPICAL

## 2021-06-14 MED ORDER — ENOXAPARIN SODIUM 40 MG/0.4ML IJ SOSY: 40.0000 mg | PREFILLED_SYRINGE | INTRAMUSCULAR | Status: DC

## 2021-06-14 MED ORDER — DEXMEDETOMIDINE HCL IN NACL 400 MCG/100ML IV SOLN
0.4000 ug/kg/h | INTRAVENOUS | Status: DC
  Administered 2021-06-14: 0.8 ug/kg/h via INTRAVENOUS
  Filled 2021-06-14: qty 100

## 2021-06-14 MED ORDER — LORAZEPAM 2 MG/ML IJ SOLN
1.0000 mg | INTRAMUSCULAR | Status: DC | PRN
  Administered 2021-06-14: 2 mg via INTRAVENOUS
  Filled 2021-06-14: qty 1

## 2021-06-14 MED ORDER — SODIUM CHLORIDE 0.9 % IV SOLN
INTRAVENOUS | Status: DC | PRN
  Administered 2021-06-14: 500 mL via INTRAVENOUS

## 2021-06-14 MED ORDER — BUDESONIDE 0.25 MG/2ML IN SUSP
0.2500 mg | Freq: Two times a day (BID) | RESPIRATORY_TRACT | Status: DC
  Administered 2021-06-15 – 2021-06-18 (×7): 0.25 mg via RESPIRATORY_TRACT
  Filled 2021-06-14 (×7): qty 2

## 2021-06-14 MED ORDER — SODIUM CHLORIDE 0.9 % IV SOLN
1.0000 g | INTRAVENOUS | Status: AC
  Administered 2021-06-14 – 2021-06-15 (×2): 1 g via INTRAVENOUS
  Filled 2021-06-14: qty 10
  Filled 2021-06-14: qty 1

## 2021-06-14 MED ORDER — METHYLPREDNISOLONE SODIUM SUCC 125 MG IJ SOLR
125.0000 mg | Freq: Once | INTRAMUSCULAR | Status: AC
  Administered 2021-06-14: 125 mg via INTRAVENOUS
  Filled 2021-06-14: qty 2

## 2021-06-14 MED ORDER — HEPARIN BOLUS VIA INFUSION
3700.0000 [IU] | Freq: Once | INTRAVENOUS | Status: AC
  Administered 2021-06-14: 3700 [IU] via INTRAVENOUS
  Filled 2021-06-14: qty 3700

## 2021-06-14 MED ORDER — HEPARIN (PORCINE) 25000 UT/250ML-% IV SOLN
1650.0000 [IU]/h | INTRAVENOUS | Status: DC
  Administered 2021-06-14: 1100 [IU]/h via INTRAVENOUS
  Administered 2021-06-15: 1600 [IU]/h via INTRAVENOUS
  Administered 2021-06-16: 1650 [IU]/h via INTRAVENOUS
  Filled 2021-06-14 (×3): qty 250

## 2021-06-14 MED ORDER — SODIUM CHLORIDE 0.9 % IV SOLN
500.0000 mg | Freq: Once | INTRAVENOUS | Status: AC
  Administered 2021-06-14: 500 mg via INTRAVENOUS
  Filled 2021-06-14: qty 500

## 2021-06-14 MED ORDER — HALOPERIDOL LACTATE 5 MG/ML IJ SOLN
5.0000 mg | Freq: Four times a day (QID) | INTRAMUSCULAR | Status: DC | PRN
  Administered 2021-06-16: 5 mg via INTRAVENOUS
  Filled 2021-06-14: qty 1

## 2021-06-14 MED ORDER — REVEFENACIN 175 MCG/3ML IN SOLN
175.0000 ug | Freq: Every day | RESPIRATORY_TRACT | Status: DC
  Filled 2021-06-14: qty 3

## 2021-06-14 MED ORDER — LACTATED RINGERS IV BOLUS
2000.0000 mL | Freq: Once | INTRAVENOUS | Status: AC
  Administered 2021-06-14: 2000 mL via INTRAVENOUS

## 2021-06-14 MED ORDER — ARFORMOTEROL TARTRATE 15 MCG/2ML IN NEBU
15.0000 ug | INHALATION_SOLUTION | Freq: Two times a day (BID) | RESPIRATORY_TRACT | Status: DC
  Administered 2021-06-15 – 2021-06-18 (×7): 15 ug via RESPIRATORY_TRACT
  Filled 2021-06-14 (×8): qty 2

## 2021-06-14 MED ORDER — AMIODARONE HCL IN DEXTROSE 360-4.14 MG/200ML-% IV SOLN
30.0000 mg/h | INTRAVENOUS | Status: DC
  Administered 2021-06-15 – 2021-06-17 (×6): 30 mg/h via INTRAVENOUS
  Filled 2021-06-14 (×7): qty 200

## 2021-06-14 MED ORDER — AMIODARONE HCL IN DEXTROSE 360-4.14 MG/200ML-% IV SOLN
60.0000 mg/h | INTRAVENOUS | Status: AC
  Administered 2021-06-14 – 2021-06-15 (×2): 60 mg/h via INTRAVENOUS
  Filled 2021-06-14 (×2): qty 200

## 2021-06-14 MED ORDER — LORAZEPAM 2 MG/ML IJ SOLN: 1.0000 mg | INTRAMUSCULAR | Status: DC | PRN

## 2021-06-14 MED ORDER — AMLODIPINE BESYLATE 5 MG PO TABS
5.0000 mg | ORAL_TABLET | Freq: Every day | ORAL | Status: DC
  Administered 2021-06-14 – 2021-06-18 (×4): 5 mg via ORAL
  Filled 2021-06-14 (×4): qty 1

## 2021-06-14 MED ORDER — LORAZEPAM 2 MG/ML IJ SOLN
2.0000 mg | Freq: Once | INTRAMUSCULAR | Status: AC
  Administered 2021-06-14: 2 mg via INTRAVENOUS

## 2021-06-14 MED ORDER — AZITHROMYCIN 250 MG PO TABS: 250.0000 mg | ORAL_TABLET | Freq: Every day | ORAL | Status: DC

## 2021-06-14 MED ORDER — HALOPERIDOL LACTATE 5 MG/ML IJ SOLN
INTRAMUSCULAR | Status: AC
  Filled 2021-06-14: qty 1

## 2021-06-14 MED ORDER — LORAZEPAM 2 MG/ML IJ SOLN
4.0000 mg | Freq: Once | INTRAMUSCULAR | Status: AC
  Administered 2021-06-14: 4 mg via INTRAVENOUS
  Filled 2021-06-14: qty 2

## 2021-06-14 NOTE — Progress Notes (Signed)
FPTS Interim Progress Note  S: Patient seen at the bedside with primary RN. Patient hallucinating.   O: BP (!) 154/106 (BP Location: Left Arm)   Pulse (!) 112   Temp (!) 97 F (36.1 C) (Axillary)   Resp 20   SpO2 98%    General: alert, confused,  CVS: tachycardic, regular rhythm  RESP: no increased work of breathing   A/P: Acute EtOH Withdrawal Continue scheduled Ativan 2mg  q4h  Add Atarax 25mg  q6h  RN to reassess in 1 hr    Lyndee Hensen, DO 06/14/2021, 3:13 PM PGY-2, [place] [facility] Service pager [phone]

## 2021-06-14 NOTE — Progress Notes (Signed)
FPTS Interim Progress Note  S:  Received page from nurse about patient being agitated, tachycardic and slightly hypotensive and had CIWA score of 34.  Patient had 4 mg of Ativan scheduled but nurse held due to hypotension.  On our arrival, patient combatitive with nurses, spitting, and trying to removed soft restraints.  SBP came back up to 110's on recheck.  Gave 2 mg of Ativan.  Waited 15 minutes, and patient remained normotensive and still agitated and instructed nurses to give remaining 2 mg.   O: BP 112/90 (BP Location: Left Arm)   Pulse (!) 163   Temp (!) 97.5 F (36.4 C) (Axillary)   Resp 20   SpO2 98%    Physical Exam HENT:     Head: Normocephalic and atraumatic.     Mouth/Throat:     Mouth: Mucous membranes are moist.  Cardiovascular:     Rate and Rhythm: Tachycardia present.  Pulmonary:     Breath sounds: Normal breath sounds.  Neurological:     Mental Status: He is alert. He is disoriented.     Cranial Nerves: Cranial nerves are intact.     Comments: Patient disoriented and agitated  Psychiatric:        Mood and Affect: Affect is angry.        Behavior: Behavior is uncooperative, agitated, aggressive and combative.     A/P:  Alcohol withdrawal Gave 4 mg Ativan.  Patient now received 21 mg over last 12 hours.  CIWA score 34.  Patient in wrist restraints.  Given agitation, combativeness, CIWA of 34 and 21 mg of Ativan over last 12 hours, consulted CCM for further withdrawal management.  Indicated they would come see patient and transfer to their service.  Updated wife on patient's condition.  Delora Fuel, MD 06/14/2021, 8:30 PM PGY-1, [place] [facility] Service pager [phone]

## 2021-06-14 NOTE — ED Notes (Signed)
Breakfast order placed ?

## 2021-06-14 NOTE — Significant Event (Signed)
Rapid Response Event Note   Reason for Call :  Agitation  Initial Focused Assessment:  Pt agitated in bed, trying to get up, remove mittens, spit, and kick staff.  Pt disoriented x 4 and is having auditory and visual hallucinations. He is tachycardic-160s and hypotensive(SBP-80s). Lungs clear. Skin warm and diaphoretic.   T-97.5, HR-160, BP-88/61, RR-30s, SpO2-96% on RA, CIWA-34  Pt has received 17mg  ativan IV since 0744 this AM.   Interventions:  EKG-Afib RVR 2mg  ativan given once BP up to 115/82, additional 2mg  ativan given d/t continued agitation (making 21mg  total since 0744) PCCM consulted: 5mg  haldol given IV  Tx to 2M13 for precedex Plan of Care:  Tx to 2M13   Event Summary:   MD Notified: MDs Maness and  Simmons-Robinson notified and came to bedside Call Stickney [id number] End [id number]  Dillard Essex, RN

## 2021-06-14 NOTE — ED Notes (Signed)
Pt sleeping at this time. Restraints discontinued

## 2021-06-14 NOTE — ED Notes (Signed)
Teaching service providers at bedside to reassess pt

## 2021-06-14 NOTE — Consult Note (Addendum)
NAME:  Ronald Lowe, MRN:  [id number], DOB:  07/13/1956, LOS: 0 ADMISSION DATE:  06/13/2021, CONSULTATION DATE:  6/27 REFERRING MD:  Dr. Erin Hearing, CHIEF COMPLAINT:  Pneumonia; alcohol withdrawal   History of Present Illness:  Patient is a 66 yo M w/ pertinent PMH of COPD (Dr. Mauricio Po patient),  tobacco use d/o, Alcohol use d/o, HLD, HTN presents to [facility] on 6/26 with SOB.  Patient symptoms started 2 days prior to admission. ER course: Positive for myalgias, increased sputum, SOB. Denies chest pain. On room air. SBP 160-170s. WBC normal. Lactic acid 3.5. CXR indicative of RUL and LLL pneumonia. Started on Azithromycin and Ceftriaxone.  Patients last drink on 6/25. Urine ethanol level 55.   PCCM consulted on 6/27 to manage alcohol withdrawal and admit to ICU to start on precedex.  Pertinent  Medical History   Past Medical History:  Diagnosis Date   Chronic kidney disease    Hyperlipidemia    Hypertension    Tobacco abuse      Significant Hospital Events: Including procedures, antibiotic start and stop dates in addition to other pertinent events   6/26: admitted to [facility] for pneumonia. Started on Azithromycin and ceftriaxone 6/27: pccm consulted for alcohol withdrawal. 17 ativan and 5 of haldol given. Admitted to icu and started on precedex.  Interim History / Subjective:  Patient tachycardic (160s) with EKG showing Afib RVR rhythm BP stable Patient agitated and coming out of bed. Restraints in place.  On RA  Objective   Blood pressure 90/72, pulse (!) 163, temperature (!) 97.5 F (36.4 C), temperature source Axillary, resp. rate 20, SpO2 99 %.    FiO2 (%):  [21 %] 21 %   Intake/Output Summary (Last 24 hours) at 06/14/2021 2019 Last data filed at 06/14/2021 1816 Gross per 24 hour  Intake 1000 ml  Output 475 ml  Net 525 ml   There were no vitals filed for this visit.  Examination: General:  restless and agitated. Wearing mittens and restrained. HEENT: MM  pink/moist Neuro: agitated and confused;  CV: s1s2, EKG showing Afib RVR with HR in 160s, no m/r/g PULM:  dim clear BS with crackles in bilateral bases; on room air GI: soft, bsx4 active  Extremities: warm/dry, no edema  Skin: no rashes or lesions   Resolved Hospital Problem list     Assessment & Plan:  Acute alcohol withdrawal: hx of alcohol use disorder: 6/27 not responding to 17 ativan and 5 of haldol. Sent to ICU and started on Precedex drip Delirium Tremens P: -admit to ICU for telemetry monitoring -Start Precedex -LR bolus given -Continue folate and thiamine -CIWA protocol in place -prn Ativan and Haldol  Multifocal Pneumonia Acute COPD exacerbation P: -Continue Azithromycin and Ceftriaxone -Order urine legionella and strep -Respiratory PCR panel -MRSA PCR neg -expectorated sputum ordered -dc home inhalers and started Yupelri, brovana, pulmicort nebs -continue steroids  Afib RVR:6/27 likely agitation related from alcohol withdrawal P: -telemetry monitoring -start precedex -consider Amio -heparin per pharmacy  Hypomagnesemia Hypokalemia P: -Trend and replete as needed  AKI with HTN P: -continue amoldipine -hold lisinopril in setting of aki -IV fluids  Tobacco use P: -nicotine   Best Practice (right click and "Reselect all SmartList Selections" daily)   Diet/type: NPO DVT prophylaxis: systemic heparin per pharmacy GI prophylaxis: PPI Lines: N/A Foley:  N/A Code Status:  full code Last date of multidisciplinary goals of care discussion [pending]  Labs   CBC: Recent Labs  Lab 06/13/21 1310  WBC 9.1  HGB 13.0  HCT 37.9*  MCV 103.3*  PLT 712    Basic Metabolic Panel: Recent Labs  Lab 06/13/21 1310 06/14/21 0357  NA 131* 131*  K 3.2* 3.7  CL 99 98  CO2 16* 20*  GLUCOSE 218* 181*  BUN 14 16  CREATININE 1.21 1.10  CALCIUM 8.0* 8.3*  MG  --  1.3*   GFR: CrCl cannot be calculated (Unknown ideal weight.). Recent Labs  Lab  06/13/21 1310 06/13/21 1406 06/13/21 1606 06/14/21 0357  WBC 9.1  --   --   --   LATICACIDVEN  --  7.0* 3.5* 3.2*    Liver Function Tests: Recent Labs  Lab 06/13/21 2216  AST 36  ALT 21  ALKPHOS 35*  BILITOT 1.1  PROT 6.1*  ALBUMIN 3.0*   No results for input(s): LIPASE, AMYLASE in the last 168 hours. No results for input(s): AMMONIA in the last 168 hours.  ABG    Component Value Date/Time   TCO2 23 04/07/2021 1506     Coagulation Profile: No results for input(s): INR, PROTIME in the last 168 hours.  Cardiac Enzymes: No results for input(s): CKTOTAL, CKMB, CKMBINDEX, TROPONINI in the last 168 hours.  HbA1C: Hemoglobin A1C  Date/Time Value Ref Range Status  03/08/2021 10:59 AM   Corrected    Comment:    ordered in error unable to delete     CBG: No results for input(s): GLUCAP in the last 168 hours.  Review of Systems:   Unable to obtain from patient; obtained from chart and bedside nurse  Past Medical History:  He,  has a past medical history of Chronic kidney disease, Hyperlipidemia, Hypertension, and Tobacco abuse.   Surgical History:   Past Surgical History:  Procedure Laterality Date   TONSILLECTOMY       Social History:   reports that he has been smoking cigarettes. He started smoking about 50 years ago. He has a 50.00 pack-year smoking history. He has never used smokeless tobacco. He reports current alcohol use of about 42.0 standard drinks of alcohol per week. He reports that he does not use drugs.   Family History:  His family history includes CAD in his father; CAD (age of onset: 75) in his brother; Emphysema in his father; Hypertension in his brother; Stroke in his mother.   Allergies No Known Allergies   Home Medications  Prior to Admission medications   Medication Sig Start Date End Date Taking? Authorizing Provider  albuterol (VENTOLIN HFA) 108 (90 Base) MCG/ACT inhaler Inhale 2 puffs into the lungs every 6 (six) hours as needed for  wheezing or shortness of breath. 05/05/21  Yes Nicolette Bang, MD  amLODipine (NORVASC) 5 MG tablet TAKE 1 TABLET (5 MG TOTAL) BY MOUTH EVERY OTHER DAY. DX: I10 Patient taking differently: Take 5 mg by mouth every other day. 05/20/21  Yes Nicolette Bang, MD  atorvastatin (LIPITOR) 40 MG tablet Take 1 tablet (40 mg total) by mouth daily. 03/25/21  Yes Nicolette Bang, MD  fluticasone-salmeterol (ADVAIR HFA) [phone] MCG/ACT inhaler INHALE 2 PUFFS INTO THE LUNGS TWICE A DAY Patient taking differently: Inhale 2 puffs into the lungs 2 (two) times daily. 05/26/21  Yes Nicolette Bang, MD  lisinopril (ZESTRIL) 5 MG tablet TAKE 0.5 TABLETS (2.5 MG TOTAL) BY MOUTH EVERY OTHER DAY. Patient taking differently: Take 2.5 mg by mouth every other day. 04/16/21  Yes Nicolette Bang, MD  pantoprazole (PROTONIX) 40 MG tablet Take 1 tablet (40 mg total)  by mouth daily. 05/26/21  Yes Nicolette Bang, MD  umeclidinium bromide (INCRUSE ELLIPTA) 62.5 MCG/INH AEPB Inhale 1 puff into the lungs daily. 04/22/21  Yes Spero Geralds, MD     Critical care time: 38       JD Rexene Agent [facility] 06/14/2021, 8:19 PM  Please see [email or web address] for pager details.  From 7A-7P if no response, please call [phone]. After hours, please call ELink [phone].

## 2021-06-14 NOTE — Progress Notes (Signed)
Spoke with ED RN, notified RN that I was in a pt room, providing pt care. I will call back for report shortly.

## 2021-06-14 NOTE — ED Notes (Signed)
Roslyn Smiling MD to notify of CIWA of 25. Per Chambliss MD pt is under internal medicine teaching service. Will notify them of CIWA.

## 2021-06-14 NOTE — Progress Notes (Signed)
Attempted report 

## 2021-06-14 NOTE — ED Notes (Addendum)
Chambliss MD at bedside

## 2021-06-14 NOTE — Progress Notes (Signed)
Family Medicine Teaching Service Daily Progress Note Intern Pager: [phone]  Patient name: Ronald Lowe Medical record number: [id number] Date of birth: 05/23/1956 Age: 65 y.o. Gender: male  Primary Care Provider: Nicolette Bang, MD Consultants: None Code Status: Full  Pt Overview and Major Events to Date:  6/26: admitted  XAYNE BRUMBAUGH is a 65 y.o. male presenting with shortness of breath. PMH significant for COPD, alcohol use disorder, HTN, HLD, and tobacco use.  Assessment and Plan:  Acute Alcohol Withdrawal  Hx of Alcohol Use Disorder Last drink 6/26 in am. Alcohol level 55 on admission. This morning patient tachycardic, hypertensive, agitated, trying to eat the pulse ox device. Bilateral soft wrist restraints in place. Oriented to self only. Last CIWA score 25.  -s/p 10mg  IV Ativan this morning -Continue CIWA monitoring with prn Ativan -Consider scheduled Ativan if persistently elevated CIWA scores -Consider Librium taper -Daily thiamine, folic acid, MVI -Check UDS  Multifocal Opacity Consistent With PNA  COPD Exacerbation CXR and CTA both concerning for multifocal pneumonia. Patient afebrile with normal white count on admission, although lactic acid significantly elevated at 3.2. Patient breathing comfortably on room air this morning with SpO2 >95%. -Continue CTX and azithromycin -s/p Solumedrol 125mg , tentative plan to start PO prednisone tomorrow -f/u blood cx results -Continuous pulse ox -Breo & Incruse ellipta daily -Albuterol inhaler q2h prn  Hypomagnesemia Mag 1.3 this morning. -4g IV Mag -Daily BMP, mag   FEN/GI: Heart healthy diet, Protonix PPx: Lovenox Dispo:Home pending clinical improvement . Barriers include acute withdrawal.   Subjective:  Patient acutely agitated, confused this morning. Unable to provide any meaningful history.  Objective: Temp:  [97.6 F (36.4 C)] 97.6 F (36.4 C) (06/26 1304) Pulse Rate:  [80-125] 96 (06/27  0553) Resp:  [15-25] 22 (06/27 0515) BP: (101-170)/(74-101) 141/92 (06/27 0515) SpO2:  [90 %-100 %] 98 % (06/27 0553) Physical Exam: General: alert, agitated, bilateral soft wrist restraints in place Cardiovascular: sinus tachycardia, regular rhythm, normal S1/S2 Respiratory: normal effort Abdomen: soft, nondistended, nontender Extremities: no peripheral edema Neuro: patient repeatedly trying to sit up in bed, attempting to bite the pulse ox device, oriented to self only, follows basic commands, no focal deficits  Laboratory: Recent Labs  Lab 06/13/21 1310  WBC 9.1  HGB 13.0  HCT 37.9*  PLT 166   Recent Labs  Lab 06/13/21 1310 06/13/21 2216 06/14/21 0357  NA 131*  --  131*  K 3.2*  --  3.7  CL 99  --  98  CO2 16*  --  20*  BUN 14  --  16  CREATININE 1.21  --  1.10  CALCIUM 8.0*  --  8.3*  PROT  --  6.1*  --   BILITOT  --  1.1  --   ALKPHOS  --  35*  --   ALT  --  21  --   AST  --  36  --   GLUCOSE 218*  --  181*      Imaging/Diagnostic Tests: DG Chest 2 View  Result Date: 06/13/2021 CLINICAL DATA:  Shortness of breath. EXAM: CHEST - 2 VIEW COMPARISON:  Chest x-ray dated June 01, 2021. FINDINGS: The heart size and mediastinal contours are within normal limits. Normal pulmonary vascularity. The lungs remain hyperinflated. New reticulonodular opacities in the right upper lobe and left lower lobe. No pleural effusion or pneumothorax. No acute osseous abnormality. IMPRESSION: 1. New reticulonodular opacities in the right upper lobe and left lower lobe, concerning for multifocal  pneumonia. Electronically Signed   By: Titus Dubin M.D.   On: 06/13/2021 13:37   CT Angio Chest Pulmonary Embolism (PE) W or WO Contrast  Result Date: 06/14/2021 CLINICAL DATA:  Shortness of breath EXAM: CT ANGIOGRAPHY CHEST WITH CONTRAST TECHNIQUE: Multidetector CT imaging of the chest was performed using the standard protocol during bolus administration of intravenous contrast. Multiplanar CT  image reconstructions and MIPs were obtained to evaluate the vascular anatomy. CONTRAST:  66mL OMNIPAQUE IOHEXOL 350 MG/ML SOLN COMPARISON:  03/16/2021 FINDINGS: Cardiovascular: No filling defects in the pulmonary arteries to suggest pulmonary emboli. Heart is normal size. Aorta is normal caliber. Coronary artery and aortic calcifications. Mediastinum/Nodes: No mediastinal, hilar, or axillary adenopathy. Trachea and esophagus are unremarkable. Thyroid unremarkable. Lungs/Pleura: Airspace disease posteriorly in the right upper lobe as well as posteriorly in the left lower lobe compatible with multifocal pneumonia. Trace right pleural effusion. Upper Abdomen: Imaging into the upper abdomen demonstrates no acute findings. Musculoskeletal: Chest wall soft tissues are unremarkable. No acute bony abnormality. Review of the MIP images confirms the above findings. IMPRESSION: No evidence of pulmonary embolus. Areas of consolidation noted in the posterior right upper lobe and posterior left lower lobe compatible with multifocal pneumonia. Trace right pleural effusion. Coronary artery disease. Aortic Atherosclerosis (ICD10-I70.0). Electronically Signed   By: Rolm Baptise M.D.   On: 06/14/2021 02:18   US Abdomen Limited RUQ (LIVER/GB)  Result Date: 06/14/2021 CLINICAL DATA:  Right upper quadrant pain EXAM: ULTRASOUND ABDOMEN LIMITED RIGHT UPPER QUADRANT COMPARISON:  06/01/2021 FINDINGS: Gallbladder: No gallstones or wall thickening visualized. No sonographic Murphy sign noted by sonographer. Common bile duct: Diameter: Mildly prominent, 8 mm Liver: No focal lesion identified. Within normal limits in parenchymal echogenicity. Portal vein is patent on color Doppler imaging with normal direction of blood flow towards the liver. Other: Small right pleural effusion IMPRESSION: No acute findings in the right upper quadrant. Small right pleural effusion. Electronically Signed   By: Rolm Baptise M.D.   On: 06/14/2021 00:43      Alcus Dad, MD 06/14/2021, 6:41 AM PGY-1, [place] Intern pager: [phone], text pages welcome

## 2021-06-14 NOTE — ED Notes (Signed)
Admitting providers at bedside again to reevaluate pt

## 2021-06-14 NOTE — Progress Notes (Signed)
ANTICOAGULATION CONSULT NOTE - Initial Consult  Pharmacy Consult for Heparin Indication: atrial fibrillation  No Known Allergies  Patient Measurements: Height: 6' (182.9 cm) Weight: 74 kg (163 lb 2.3 oz) IBW/kg (Calculated) : 77.6 Heparin Dosing Weight: 74 kg   Vital Signs: Temp: 98.2 F (36.8 C) (06/27 2047) Temp Source: Axillary (06/27 2047) BP: 95/65 (06/27 2115) Pulse Rate: 120 (06/27 2115)  Labs: Recent Labs    06/13/21 1310 06/14/21 0357 06/14/21 2103  HGB 13.0  --  11.6*  HCT 37.9*  --  32.4*  PLT 166  --  164  CREATININE 1.21 1.10 0.99    Estimated Creatinine Clearance: 76.8 mL/min (by C-G formula based on SCr of 0.99 mg/dL).   Medical History: Past Medical History:  Diagnosis Date   Chronic kidney disease    Hyperlipidemia    Hypertension    Tobacco abuse     Medications:  Medications Prior to Admission  Medication Sig Dispense Refill Last Dose   albuterol (VENTOLIN HFA) 108 (90 Base) MCG/ACT inhaler Inhale 2 puffs into the lungs every 6 (six) hours as needed for wheezing or shortness of breath. 18 g 1 unknown   amLODipine (NORVASC) 5 MG tablet TAKE 1 TABLET (5 MG TOTAL) BY MOUTH EVERY OTHER DAY. DX: I10 (Patient taking differently: Take 5 mg by mouth every other day.) 90 tablet 1 06/12/2021   atorvastatin (LIPITOR) 40 MG tablet Take 1 tablet (40 mg total) by mouth daily. 90 tablet 3 06/12/2021   fluticasone-salmeterol (ADVAIR HFA) 45-21 MCG/ACT inhaler INHALE 2 PUFFS INTO THE LUNGS TWICE A DAY (Patient taking differently: Inhale 2 puffs into the lungs 2 (two) times daily.) 8 each 12 06/13/2021   lisinopril (ZESTRIL) 5 MG tablet TAKE 0.5 TABLETS (2.5 MG TOTAL) BY MOUTH EVERY OTHER DAY. (Patient taking differently: Take 2.5 mg by mouth every other day.) 23 tablet 5 06/12/2021   pantoprazole (PROTONIX) 40 MG tablet Take 1 tablet (40 mg total) by mouth daily. 90 tablet 1 06/12/2021   umeclidinium bromide (INCRUSE ELLIPTA) 62.5 MCG/INH AEPB Inhale 1 puff into the  lungs daily. 1 each 11 06/13/2021    Assessment: 65 yo male presented on 06/13/2021 with SOB. Transferred to ICU for management of alcohol. Patient now in Afib. Pharmacy consulted to dose heparin for Afib. No anticoagulation prior to admission. Hgb 11.6. Plt wnl. No bleeding noted.  Goal of Therapy:  Heparin level 0.3-0.7 units/ml Monitor platelets by anticoagulation protocol: Yes   Plan:  Heparin 3700 units bolus x1  Start heparin 1100 units/hr  Check 6 hr heparin level  Monitor heparin level, CBC and s/s of bleeding daily   Cristela Felt, PharmD Clinical Pharmacist  06/14/2021,10:03 PM

## 2021-06-14 NOTE — ED Notes (Signed)
Admitting team at bedside. Obtained verbal order for 2mg  IV ativan from [facility] MD

## 2021-06-14 NOTE — ED Notes (Signed)
Paged internal medicine teaching service. Awaiting response.

## 2021-06-14 NOTE — ED Notes (Signed)
Pt was found wandering around room trying to come out of room and talking about leaving. Pt is tachycardic, hypertensive, has tremors and is disoriented. CIWA to be obtained and admitting provider to be notified.

## 2021-06-14 NOTE — ED Notes (Signed)
Patient transported to CT 

## 2021-06-14 NOTE — ED Notes (Signed)
Family medicine resident paged by Network engineer. Received call back from provider. Updated provider on pt status w/ CIWA of 25, AMS, and pt in restraints for safety. Provider to come reassess pt.

## 2021-06-14 NOTE — ED Notes (Signed)
Attempted report x 2 

## 2021-06-14 NOTE — ED Notes (Signed)
Attempted report x1. 

## 2021-06-14 NOTE — Progress Notes (Addendum)
eLink Physician-Brief Progress Note Patient Name: ANUEL SITTER DOB: [birth year] MRN: [id number]   Date of Service  06/14/2021  HPI/Events of Note  AFIB with RVR - Ventricular rate = 148. BP = 103/82. K+ = 3.5 and Creatinine = 0.99.  eICU Interventions  Plan: Amiodarone IV load and infusion. Replace K+. Mg++ level STAT. Heparin IV infusion per pharmacy consult. D/C Lovenox.     Intervention Category Major Interventions: Electrolyte abnormality - evaluation and management;Arrhythmia - evaluation and management  Frederick Marro Eugene 06/14/2021, 9:59 PM

## 2021-06-15 ENCOUNTER — Inpatient Hospital Stay (HOSPITAL_COMMUNITY): Payer: Medicare Other

## 2021-06-15 DIAGNOSIS — J441 Chronic obstructive pulmonary disease with (acute) exacerbation: Secondary | ICD-10-CM | POA: Diagnosis not present

## 2021-06-15 LAB — RESPIRATORY PANEL BY PCR

## 2021-06-15 LAB — CBC
HCT: 33.8 % — ABNORMAL LOW (ref 39.0–52.0)
Hemoglobin: 11.9 g/dL — ABNORMAL LOW (ref 13.0–17.0)
MCH: 35.1 pg — ABNORMAL HIGH (ref 26.0–34.0)
MCHC: 35.2 g/dL (ref 30.0–36.0)
MCV: 99.7 fL (ref 80.0–100.0)
Platelets: 150 10*3/uL (ref 150–400)
RBC: 3.39 MIL/uL — ABNORMAL LOW (ref 4.22–5.81)
RDW: 13.3 % (ref 11.5–15.5)
WBC: 6.6 10*3/uL (ref 4.0–10.5)
nRBC: 0 % (ref 0.0–0.2)

## 2021-06-15 LAB — HEMOGLOBIN A1C
Hgb A1c MFr Bld: 5 % (ref 4.8–5.6)
Mean Plasma Glucose: 97 mg/dL

## 2021-06-15 LAB — GLUCOSE, CAPILLARY: Glucose-Capillary: 147 mg/dL — ABNORMAL HIGH (ref 70–99)

## 2021-06-15 LAB — HEPARIN LEVEL (UNFRACTIONATED)
Heparin Unfractionated: 0.13 IU/mL — ABNORMAL LOW (ref 0.30–0.70)
Heparin Unfractionated: 0.14 IU/mL — ABNORMAL LOW (ref 0.30–0.70)
Heparin Unfractionated: 0.33 IU/mL (ref 0.30–0.70)

## 2021-06-15 LAB — BASIC METABOLIC PANEL
Anion gap: 10 (ref 5–15)
BUN: 19 mg/dL (ref 8–23)
CO2: 21 mmol/L — ABNORMAL LOW (ref 22–32)
Calcium: 8.6 mg/dL — ABNORMAL LOW (ref 8.9–10.3)
Chloride: 103 mmol/L (ref 98–111)
Creatinine, Ser: 0.94 mg/dL (ref 0.61–1.24)
GFR, Estimated: >60 mL/min (ref >60)
Glucose, Bld: 163 mg/dL — ABNORMAL HIGH (ref 70–99)
Potassium: 4.3 mmol/L (ref 3.5–5.1)
Sodium: 134 mmol/L — ABNORMAL LOW (ref 135–145)

## 2021-06-15 LAB — MAGNESIUM: Magnesium: 2.6 mg/dL — ABNORMAL HIGH (ref 1.7–2.4)

## 2021-06-15 LAB — PROCALCITONIN: Procalcitonin: 17.3 ng/mL

## 2021-06-15 IMAGING — DX DG CHEST 1V PORT
1 series · 1 of 1 positions shown · non-contrast
Comparison: [DATE].  CT [DATE]

CLINICAL DATA: Pneumonia

EXAM:
PORTABLE CHEST 1 VIEW

[chest ap]
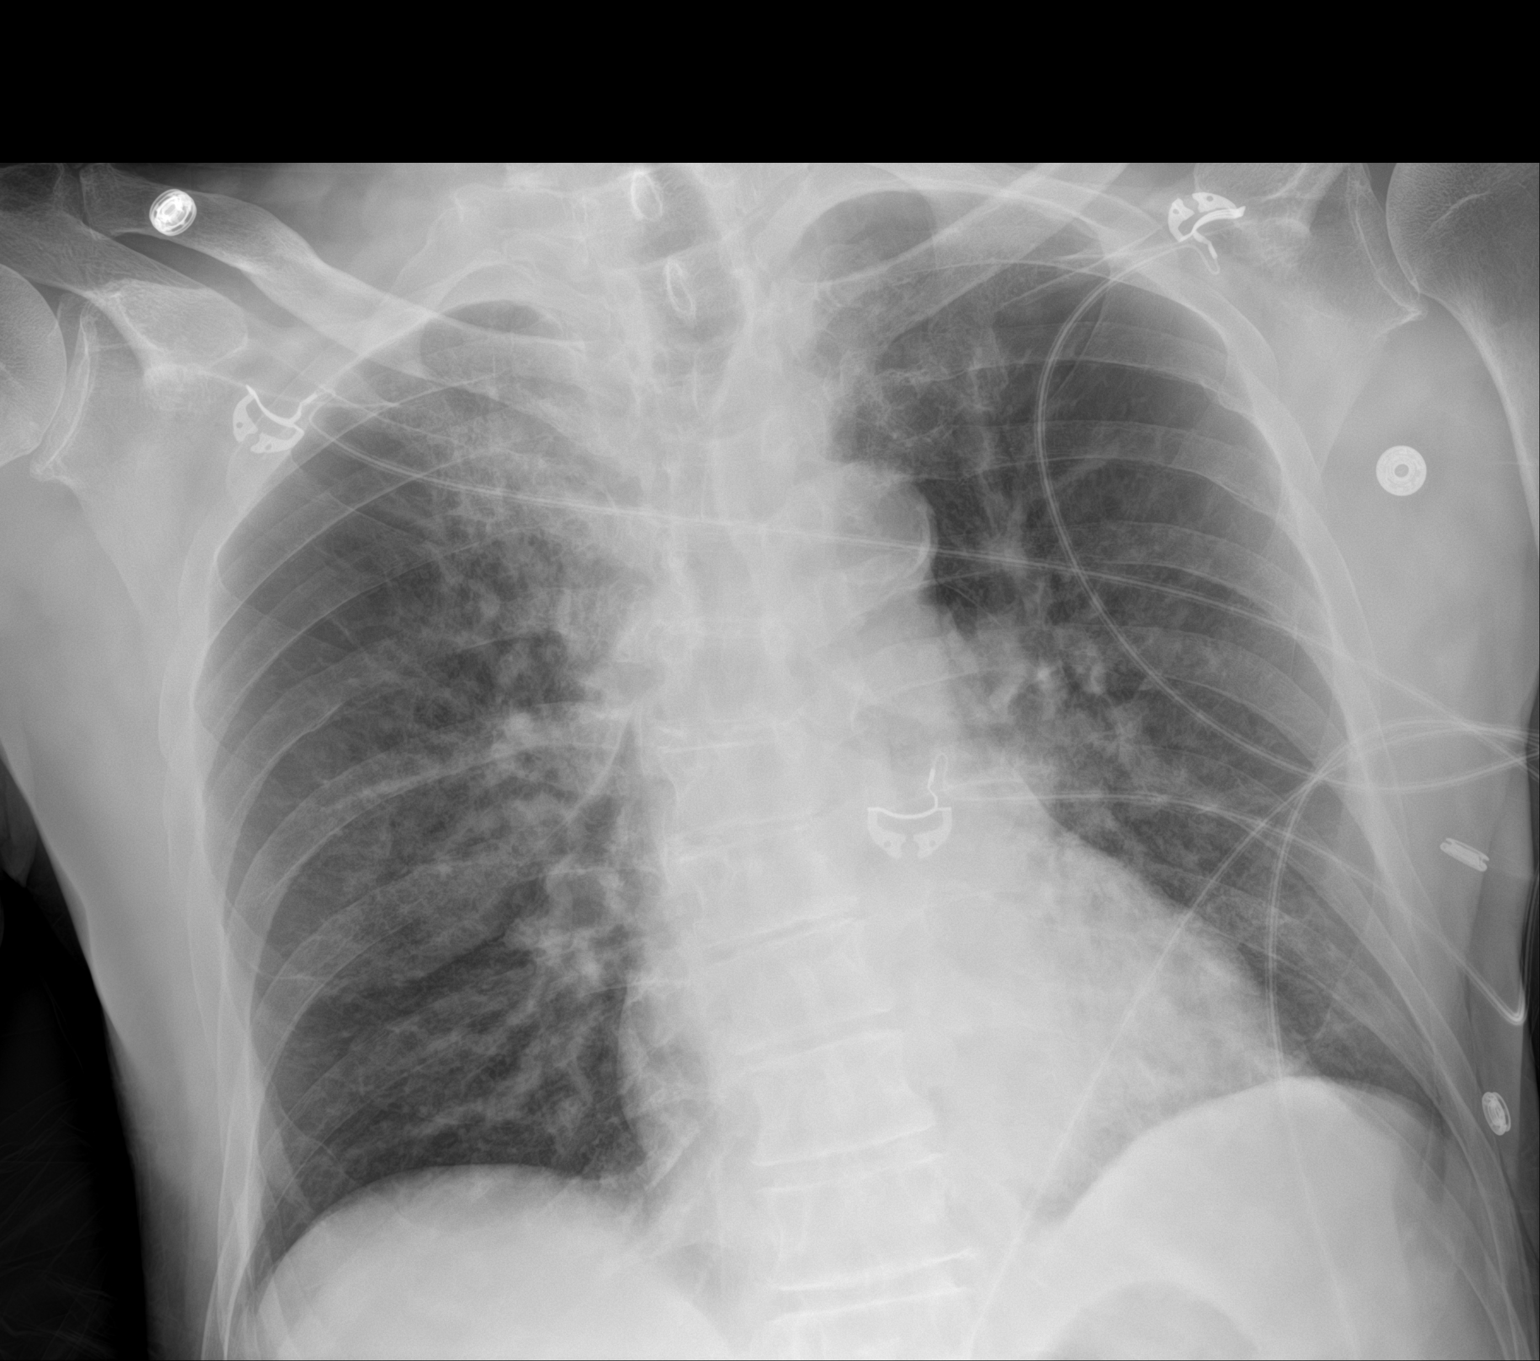

[1 of 1 positions shown; findings below may reference images not displayed]

FINDINGS: Patchy airspace disease in the left lower lobe and medial right
upper lobe compatible with pneumonia as seen on earlier chest CT.
Heart is normal size. No effusions or pneumothorax. No acute bony
abnormality.
IMPRESSION: Continued right upper lobe and left lower lobe airspace
opacities/pneumonia.

## 2021-06-15 MED ORDER — ORAL CARE MOUTH RINSE
15.0000 mL | Freq: Two times a day (BID) | OROMUCOSAL | Status: DC
  Administered 2021-06-15 – 2021-06-18 (×7): 15 mL via OROMUCOSAL

## 2021-06-15 MED ORDER — SODIUM CHLORIDE 0.9 % IV SOLN
1.0000 mg | Freq: Once | INTRAVENOUS | Status: AC
  Administered 2021-06-15: 1 mg via INTRAVENOUS
  Filled 2021-06-15: qty 0.2

## 2021-06-15 MED ORDER — HEPARIN BOLUS VIA INFUSION
2500.0000 [IU] | Freq: Once | INTRAVENOUS | Status: AC
  Administered 2021-06-15: 2500 [IU] via INTRAVENOUS
  Filled 2021-06-15: qty 2500

## 2021-06-15 MED ORDER — PANTOPRAZOLE SODIUM 40 MG IV SOLR
40.0000 mg | INTRAVENOUS | Status: DC
  Administered 2021-06-15 – 2021-06-16 (×2): 40 mg via INTRAVENOUS
  Filled 2021-06-15 (×2): qty 40

## 2021-06-15 MED ORDER — REVEFENACIN 175 MCG/3ML IN SOLN
175.0000 ug | Freq: Every day | RESPIRATORY_TRACT | Status: DC
  Administered 2021-06-15 – 2021-06-18 (×4): 175 ug via RESPIRATORY_TRACT
  Filled 2021-06-15 (×3): qty 3

## 2021-06-15 MED ORDER — METHYLPREDNISOLONE SODIUM SUCC 125 MG IJ SOLR
40.0000 mg | Freq: Two times a day (BID) | INTRAMUSCULAR | Status: DC
  Administered 2021-06-15 – 2021-06-16 (×2): 40 mg via INTRAVENOUS
  Filled 2021-06-15 (×2): qty 2

## 2021-06-15 MED ORDER — THIAMINE HCL 100 MG/ML IJ SOLN
100.0000 mg | Freq: Every day | INTRAMUSCULAR | Status: DC
  Administered 2021-06-15: 100 mg via INTRAVENOUS
  Filled 2021-06-15 (×2): qty 2

## 2021-06-15 MED ORDER — HEPARIN BOLUS VIA INFUSION
3000.0000 [IU] | Freq: Once | INTRAVENOUS | Status: AC
  Administered 2021-06-15: 3000 [IU] via INTRAVENOUS
  Filled 2021-06-15: qty 3000

## 2021-06-15 NOTE — Progress Notes (Signed)
NAME:  Ronald Lowe, MRN:  [id number], DOB:  1956/09/12, LOS: 1 ADMISSION DATE:  06/13/2021, CONSULTATION DATE:  6/27 REFERRING MD:  Dr. Erin Hearing, CHIEF COMPLAINT:  Pneumonia; alcohol withdrawal   History of Present Illness:  Patient is a 65 yo M w/ pertinent PMH of COPD (Dr. Mauricio Po patient),  tobacco use d/o, Alcohol use d/o, HLD, HTN [facility].  Patient symptoms started 2 days prior to admission. ER course: Positive for myalgias, increased sputum, SOB. Denies chest pain. On room air. SBP 160-170s. WBC normal. Lactic acid 3.5. CXR indicative of RUL and LLL pneumonia. Started on Azithromycin and Ceftriaxone.  Patients last drink on 6/25. Urine ethanol level 55.   PCCM consulted on 6/27 to manage alcohol withdrawal and admit to ICU to start on precedex.  Pertinent  Medical History   Past Medical History:  Diagnosis Date   Chronic kidney disease    Hyperlipidemia    Hypertension    Tobacco abuse      Significant Hospital Events: Including procedures, antibiotic start and stop dates in addition to other pertinent events   6/26: admitted to [facility] for pneumonia. Started on Azithromycin and ceftriaxone 6/27: pccm consulted for alcohol withdrawal. 17 ativan and 5 of haldol given. Admitted to icu and started on precedex.  Interim History / Subjective:  6/28: transferred to ICU overnight 2/2 afib with rvr, agitation and potential need for intubation. At this time pt appears comfortable, sedated on precedex but spontaneously moving all 4. Wife at bedside and updated.   Objective   Blood pressure (!) 138/109, pulse 91, temperature 98 F (36.7 C), temperature source Axillary, resp. rate 17, height 6' (1.829 m), weight 74 kg, SpO2 98 %.    FiO2 (%):  [21 %] 21 %   Intake/Output Summary (Last 24 hours) at 06/15/2021 0830 Last data filed at 06/15/2021 0600 Gross per 24 hour  Intake 2852.24 ml  Output 1175 ml  Net 1677.24 ml   Filed Weights   06/14/21 2001   Weight: 74 kg    Examination: General:  sedated on precedex laying supine in bed in nad HEENT: ncat , perrla, MM pink/moist Neuro: sedated but no focal deficits appreciated, mobilizing all 4 spontaneously CV: s1s2, EKG showing Afib HR in 80's PULM:  dim clear BS with crackles in bilateral bases; on room air GI: soft, bsx4 active  Extremities: warm/dry, no edema  Skin: no rashes or lesions   Resolved Hospital Problem list     Assessment & Plan:  Acute alcohol withdrawal: hx of alcohol use disorder: 6/27 not responding to 17 ativan and 5 of haldol. Sent to ICU and started on Precedex drip Delirium Tremens P: -admit to ICU for telemetry monitoring -wean precedex at this time -LR bolus given -Continue folate and thiamine -CIWA protocol in place -prn Ativan and Haldol  Multifocal Pneumonia Acute COPD exacerbation P: -Continue Azithromycin and Ceftriaxone - urine legionella (pending) and strep (pending collection -RVP negative -MRSA PCR neg -expectorated sputum ordered -dc home inhalers and started Yupelri, brovana, pulmicort nebs -steroids  Afib RVR:6/27 likely agitation related from alcohol withdrawal P: -telemetry monitoring -amio infusion -heparin per pharmacy  Hypomagnesemia Hypokalemia P: -Trend and replete as needed  AKI with HTN P: -continue amoldipine -hold lisinopril in setting of aki -IV fluids  Tobacco use P: -nicotine   Best Practice (right click and "Reselect all SmartList Selections" daily)   Diet/type: NPO DVT prophylaxis: systemic heparin per pharmacy GI prophylaxis: PPI Lines: N/A Foley:  N/A Code Status:  full code Last date of multidisciplinary goals of care discussion [with wife and RN at bedside 6/28]  Labs   CBC: Recent Labs  Lab 06/13/21 1310 06/14/21 2103 06/15/21 0504  WBC 9.1 7.3 6.6  HGB 13.0 11.6* 11.9*  HCT 37.9* 32.4* 33.8*  MCV 103.3* 98.2 99.7  PLT 166 164 779    Basic Metabolic Panel: Recent Labs  Lab  06/13/21 1310 06/14/21 0357 06/14/21 2103 06/15/21 0504  NA 131* 131* 135 134*  K 3.2* 3.7 3.5 4.3  CL 99 98 103 103  CO2 16* 20* 20* 21*  GLUCOSE 218* 181* 123* 163*  BUN 14 16 19 19   CREATININE 1.21 1.10 0.99 0.94  CALCIUM 8.0* 8.3* 8.5* 8.6*  MG  --  1.3* 2.6* 2.6*   GFR: Estimated Creatinine Clearance: 80.9 mL/min (by C-G formula based on SCr of 0.94 mg/dL). Recent Labs  Lab 06/13/21 1310 06/13/21 1406 06/13/21 1606 06/14/21 0357 06/14/21 2103 06/15/21 0504  PROCALCITON  --   --   --   --  21.61 17.30  WBC 9.1  --   --   --  7.3 6.6  LATICACIDVEN  --  7.0* 3.5* 3.2* 2.2*  --     Liver Function Tests: Recent Labs  Lab 06/13/21 2216 06/14/21 2103  AST 36 29  ALT 21 20  ALKPHOS 35* 34*  BILITOT 1.1 0.9  PROT 6.1* 6.0*  ALBUMIN 3.0* 3.0*   No results for input(s): LIPASE, AMYLASE in the last 168 hours. No results for input(s): AMMONIA in the last 168 hours.  ABG    Component Value Date/Time   TCO2 23 04/07/2021 1506     Coagulation Profile: No results for input(s): INR, PROTIME in the last 168 hours.  Cardiac Enzymes: No results for input(s): CKTOTAL, CKMB, CKMBINDEX, TROPONINI in the last 168 hours.  HbA1C: Hemoglobin A1C  Date/Time Value Ref Range Status  03/08/2021 10:59 AM   Corrected    Comment:    ordered in error unable to delete    Hgb A1c MFr Bld  Date/Time Value Ref Range Status  06/14/2021 03:57 AM 5.0 4.8 - 5.6 % Final    Comment:    (NOTE)         Prediabetes: 5.7 - 6.4         Diabetes: >6.4         Glycemic control for adults with diabetes: <7.0     CBG: Recent Labs  Lab 06/14/21 2047 06/14/21 2331 06/15/21 0321  GLUCAP 118* 133* 147*   Critical care time: The patient is critically ill with multiple organ systems failure and requires high complexity decision making for assessment and support, frequent evaluation and titration of therapies, application of advanced monitoring technologies and extensive interpretation of  multiple databases.  Critical care time 35 mins. This represents my time independent of the NPs time taking care of the pt. This is excluding procedures.    Audria Nine DO [facility] 06/15/2021, 8:30 AM See Amion for pager If no response to pager, please call [phone] until 1900 After 1900 please call [facility] [phone]

## 2021-06-15 NOTE — Progress Notes (Signed)
ANTICOAGULATION CONSULT NOTE - Follow Up Consult  Pharmacy Consult for heparin Indication: atrial fibrillation   Labs: Recent Labs    06/13/21 1310 06/14/21 0357 06/14/21 2103 06/15/21 0504  HGB 13.0  --  11.6* 11.9*  HCT 37.9*  --  32.4* 33.8*  PLT 166  --  164 150  HEPARINUNFRC  --   --   --  0.13*  CREATININE 1.21 1.10 0.99 0.94    Assessment: 64yo male subtherapeutic on heparin with initial dosing for Afib; no gtt issues or signs of bleeding per RN.  Goal of Therapy:  Heparin level 0.3-0.7 units/ml   Plan:  Will rebolus with heparin 3000 units and increase heparin gtt by 3 units/kg/hr to 1300 units/hr and check level in 6 hours.    Wynona Neat, PharmD, BCPS  06/15/2021,6:04 AM

## 2021-06-15 NOTE — Hospital Course (Addendum)
Ronald Lowe is a 65 y.o. male who presented with dyspnea and found to have multifocal PNA and later underwent acute alcohol withdrawal.   Multifocal pneumonia  Acute COPD exacerbation  Patient was treated with IV ceftriaxone, azithromycin and given a dose of IV steroids with suspicion for COPD exacerbation.  Patient did not require supplemental oxygen.  Patient was started on steroids for total of 5 days. Patient was treated with antibiotics for several days (ceftriaxone x3 doses, azithromycin x2 doses) and was not continued while in the ICU, patient missed doses before transfer to the floor and did not feel the need to continue antibiotics as patient was significantly improved. In the ICU, patient was tested for Legionella and strep pneumo. His strep pneumo was negative and Legionella had not resulted at the time of discharge.   Acute Alcohol withdrawal  Delirium Tremens On the morning after admission, patient's CIWA was elevated and he was transitioned from [facility] Ativan to 4mg  Ativan every 4 hours. His agitation later worsened and CIWA increased to 34. The patient also became more agitated and combative despite receiving 4 mg of Ativan. Vitals at the time were hypotensive with systolic BP in the 56O &  tachycardia (AF with RVR). He was evaluated by CCM and subsequently transferred to ICU and started on Precedex drip.  Patient transferred back out to floor status on 6/30. Patient started on Naltrexone for decreasing alcohol craving. Patient was continued on folate and thiamine during hospitalization and upon discharge.   Afib with RVR On 6/27 patient went into A. fib with RVR, likely related to alcohol use and withdrawal. In the ICU, patient was placed on amiodarone drip. Patient was started on Eliquis 5mg  BID. On return to the floor, cardiology was consulted and recommended transition to oral 200mg  of amiodarone followed by 30 days of amiodarone once daily. Echocardiogram was completed to evaluate  cardiac structure and showed EF 13-08%, Grade I diastolic dysfunction and no valvular pathology. Patient's heart rate was  controlled at the time of discharge.  AKI Patient had elevated creatinine of 1.90.  Home lisinopril was held until resolution. Blood pressures were stable during hospitalization and patient discharged on 06/18/2021.   All other chronic conditions were stable during hospitalization.   Issues for follow-up: Recommend outpt PFTs given amiodarone therapy.  Naltrexone started in the hospital, continued encouragement of cessation counseling  Recommend continuation of folic acid and thiamine supplementation. Monitor respiratory symptoms, antibiotic treatment would not have covered duration recommended for CAP. Patient voices a desire to quit smoking. Given nicotine patches upon discharge. Recommend further counseling regarding cessation/abstinence.  May consider increasing metoprolol tartrate from 12.5mg  to 25mg  twice daily if his BP remains high. Recommend follow-up CXR in 6 weeks to follow-up on COPD/possible multifocal PNA

## 2021-06-15 NOTE — Progress Notes (Signed)
eLink Physician-Brief Progress Note Patient Name: Ronald Lowe DOB: 1956/02/22 MRN: [id number]   Date of Service  06/15/2021  HPI/Events of Note  Urinary retention - Bladder scan with 604 mL residual.   eICU Interventions  Plan: I/O Cath PRN.      Intervention Category Major Interventions: Other:  [name] 06/15/2021, 3:53 AM

## 2021-06-15 NOTE — Progress Notes (Signed)
SLP Cancellation Note  Patient Details Name: Ronald Lowe MRN: [id number] DOB: September 11, 1956   Cancelled treatment:       Reason Eval/Treat Not Completed: Patient's level of consciousness. May check back later if possible   Danai Gotto, Katherene Ponto 06/15/2021, 8:48 AM

## 2021-06-15 NOTE — Progress Notes (Signed)
ANTICOAGULATION CONSULT NOTE - Follow Up Consult  Pharmacy Consult for IV Heparin Indication: atrial fibrillation  No Known Allergies  Patient Measurements: Height: 6' (182.9 cm) Weight: 74 kg (163 lb 2.3 oz) IBW/kg (Calculated) : 77.6 Heparin Dosing Weight: 74 kg  Vital Signs: Temp: 98 F (36.7 C) (06/28 0331) Temp Source: Axillary (06/28 0331) BP: 130/107 (06/28 0900) Pulse Rate: 96 (06/28 0900)  Labs: Recent Labs    06/13/21 1310 06/14/21 0357 06/14/21 2103 06/15/21 0504 06/15/21 1305  HGB 13.0  --  11.6* 11.9*  --   HCT 37.9*  --  32.4* 33.8*  --   PLT 166  --  164 150  --   HEPARINUNFRC  --   --   --  0.13* 0.14*  CREATININE 1.21 1.10 0.99 0.94  --     Estimated Creatinine Clearance: 80.9 mL/min (by C-G formula based on SCr of 0.94 mg/dL).   Medications:  Infusions:   sodium chloride 10 mL/hr at 06/15/21 0700   amiodarone 30 mg/hr (06/15/21 1043)   cefTRIAXone (ROCEPHIN)  IV Stopped (06/14/21 1629)   dexmedetomidine (PRECEDEX) IV infusion 0.2 mcg/kg/hr (06/15/21 0700)   heparin 1,300 Units/hr (06/15/21 0700)    Assessment: 65 years of age male who presented on 06/13/2021 with shortness of breath. Patient was transferred to ICU care for management of alcohol withdrawal. Patient went into atrial fibrillation and pharmacy was consulted to dose IV Heparin. Patient was not on any anticoagulation prior to admission.   Heparin level remains low at 0.14 after bolus and increase in drip rate to 1300 units/hr. CBC is stable. No issues with infusion or bleeding noted per discussion with Nate,RN.  Goal of Therapy:  Heparin level 0.3-0.7 units/ml Monitor platelets by anticoagulation protocol: Yes   Plan:  Re-bolus IV Heparin 2500 units x1. Increase IV Heparin to 1600 units/hr.  Recheck Heparin level in 6-8 hours.  Daily Heparin level and CBC while on therapy.   Sloan Leiter, PharmD, BCPS, BCCCP Clinical Pharmacist Please refer to [facility] for [place]  numbers 06/15/2021,2:13 PM

## 2021-06-15 NOTE — Progress Notes (Signed)
ANTICOAGULATION CONSULT NOTE - Follow Up Consult  Pharmacy Consult for IV Heparin Indication: atrial fibrillation  No Known Allergies  Patient Measurements: Height: 6' (182.9 cm) Weight: 74 kg (163 lb 2.3 oz) IBW/kg (Calculated) : 77.6 Heparin Dosing Weight: 74 kg  Vital Signs: Temp: 98.3 F (36.8 C) (06/28 1936) Temp Source: Oral (06/28 1936) BP: 141/108 (06/28 2200) Pulse Rate: 101 (06/28 2200)  Labs: Recent Labs    06/13/21 1310 06/14/21 0357 06/14/21 2103 06/15/21 0504 06/15/21 1305 06/15/21 2115  HGB 13.0  --  11.6* 11.9*  --   --   HCT 37.9*  --  32.4* 33.8*  --   --   PLT 166  --  164 150  --   --   HEPARINUNFRC  --   --   --  0.13* 0.14* 0.33  CREATININE 1.21 1.10 0.99 0.94  --   --      Estimated Creatinine Clearance: 80.9 mL/min (by C-G formula based on SCr of 0.94 mg/dL).   Medications:  Infusions:   sodium chloride 10 mL/hr at 06/15/21 2200   amiodarone 30 mg/hr (06/15/21 2200)   dexmedetomidine (PRECEDEX) IV infusion 0.2 mcg/kg/hr (06/15/21 2200)   heparin 1,600 Units/hr (06/15/21 2200)    Assessment: 65 years of age male who presented on 06/13/2021 with shortness of breath. Patient was transferred to ICU care for management of alcohol withdrawal. Patient went into atrial fibrillation and pharmacy was consulted to dose IV Heparin. Patient was not on any anticoagulation prior to admission.   Heparin level of 0.33 on heparin 1600 units/hr is therapeutic though at the lower end of range. Level drawn appropriately per RN. No issues with IV infusion or access. No bleeding noted.   Goal of Therapy:  Heparin level 0.3-0.7 units/ml Monitor platelets by anticoagulation protocol: Yes   Plan:  Increase heparin to 1650 units/hr to ensure remains in therapeutic range  Check 6 hr heparin level  Monitor heparin level, CBC and s/s of bleeding  Cristela Felt, PharmD Clinical Pharmacist  06/15/2021,10:28 PM

## 2021-06-16 DIAGNOSIS — J441 Chronic obstructive pulmonary disease with (acute) exacerbation: Secondary | ICD-10-CM | POA: Diagnosis not present

## 2021-06-16 LAB — BASIC METABOLIC PANEL
Anion gap: 9 (ref 5–15)
BUN: 25 mg/dL — ABNORMAL HIGH (ref 8–23)
CO2: 21 mmol/L — ABNORMAL LOW (ref 22–32)
Calcium: 8.5 mg/dL — ABNORMAL LOW (ref 8.9–10.3)
Chloride: 109 mmol/L (ref 98–111)
Creatinine, Ser: 1.06 mg/dL (ref 0.61–1.24)
GFR, Estimated: >60 mL/min (ref >60)
Glucose, Bld: 135 mg/dL — ABNORMAL HIGH (ref 70–99)
Potassium: 4.1 mmol/L (ref 3.5–5.1)
Sodium: 139 mmol/L (ref 135–145)

## 2021-06-16 LAB — CBC
HCT: 36.8 % — ABNORMAL LOW (ref 39.0–52.0)
Hemoglobin: 12.9 g/dL — ABNORMAL LOW (ref 13.0–17.0)
MCH: 35 pg — ABNORMAL HIGH (ref 26.0–34.0)
MCHC: 35.1 g/dL (ref 30.0–36.0)
MCV: 99.7 fL (ref 80.0–100.0)
Platelets: 210 10*3/uL (ref 150–400)
RBC: 3.69 MIL/uL — ABNORMAL LOW (ref 4.22–5.81)
RDW: 13.6 % (ref 11.5–15.5)
WBC: 4.9 10*3/uL (ref 4.0–10.5)
nRBC: 0 % (ref 0.0–0.2)

## 2021-06-16 LAB — VITAMIN B1: Vitamin B1 (Thiamine): 132.8 nmol/L (ref 66.5–200.0)

## 2021-06-16 LAB — RAPID URINE DRUG SCREEN, HOSP PERFORMED
Amphetamines: NOT DETECTED
Barbiturates: NOT DETECTED
Benzodiazepines: POSITIVE — AB
Cocaine: POSITIVE — AB
Opiates: NOT DETECTED
Tetrahydrocannabinol: POSITIVE — AB

## 2021-06-16 LAB — HEPARIN LEVEL (UNFRACTIONATED): Heparin Unfractionated: 0.26 IU/mL — ABNORMAL LOW (ref 0.30–0.70)

## 2021-06-16 LAB — PROCALCITONIN: Procalcitonin: 7.38 ng/mL

## 2021-06-16 LAB — STREP PNEUMONIAE URINARY ANTIGEN: Strep Pneumo Urinary Antigen: NEGATIVE

## 2021-06-16 MED ORDER — AMIODARONE IV BOLUS ONLY 150 MG/100ML
150.0000 mg | Freq: Once | INTRAVENOUS | Status: DC
  Filled 2021-06-16: qty 100

## 2021-06-16 MED ORDER — THIAMINE HCL 100 MG PO TABS
100.0000 mg | ORAL_TABLET | Freq: Every day | ORAL | Status: DC
  Administered 2021-06-16 – 2021-06-18 (×3): 100 mg via ORAL
  Filled 2021-06-16 (×3): qty 1

## 2021-06-16 MED ORDER — PREDNISONE 20 MG PO TABS
40.0000 mg | ORAL_TABLET | Freq: Every day | ORAL | Status: DC
  Administered 2021-06-17: 40 mg via ORAL
  Filled 2021-06-16: qty 2

## 2021-06-16 MED ORDER — APIXABAN 5 MG PO TABS
5.0000 mg | ORAL_TABLET | Freq: Two times a day (BID) | ORAL | Status: DC
  Administered 2021-06-16 – 2021-06-18 (×5): 5 mg via ORAL
  Filled 2021-06-16 (×5): qty 1

## 2021-06-16 MED ORDER — FOLIC ACID 1 MG PO TABS
1.0000 mg | ORAL_TABLET | Freq: Every day | ORAL | Status: DC
  Administered 2021-06-16 – 2021-06-18 (×3): 1 mg via ORAL
  Filled 2021-06-16 (×3): qty 1

## 2021-06-16 MED ORDER — PANTOPRAZOLE SODIUM 40 MG PO TBEC
40.0000 mg | DELAYED_RELEASE_TABLET | Freq: Every day | ORAL | Status: DC
  Administered 2021-06-17: 40 mg via ORAL
  Filled 2021-06-16: qty 1

## 2021-06-16 MED ORDER — AMIODARONE LOAD VIA INFUSION
150.0000 mg | Freq: Once | INTRAVENOUS | Status: AC
  Administered 2021-06-16: 150 mg via INTRAVENOUS
  Filled 2021-06-16: qty 83.34

## 2021-06-16 NOTE — Progress Notes (Signed)
SLP Cancellation Note  Patient Details Name: Ronald Lowe MRN: [id number] DOB: 05/06/1956   Cancelled treatment:       Reason Eval/Treat Not Completed: Other (comment). Pt passed [facility] with RN. Will defer eval.    Lakai Moree, Katherene Ponto 06/16/2021, 9:59 AM

## 2021-06-16 NOTE — Progress Notes (Signed)
ANTICOAGULATION CONSULT NOTE - Follow Up Consult  Pharmacy Consult for IV Heparin Indication: atrial fibrillation  No Known Allergies  Patient Measurements: Height: 6' (182.9 cm) Weight: 73.6 kg (162 lb 4.1 oz) IBW/kg (Calculated) : 77.6 Heparin Dosing Weight: 74 kg  Vital Signs: Temp: 97.6 F (36.4 C) (06/29 0739) Temp Source: Axillary (06/29 0739) BP: 132/96 (06/29 0800) Pulse Rate: 103 (06/29 0800)  Labs: Recent Labs    06/14/21 2103 06/14/21 2103 06/15/21 0504 06/15/21 1305 06/15/21 2115 06/16/21 0725  HGB 11.6*  --  11.9*  --   --  12.9*  HCT 32.4*  --  33.8*  --   --  36.8*  PLT 164  --  150  --   --  210  HEPARINUNFRC  --    < > 0.13* 0.14* 0.33 0.26*  CREATININE 0.99  --  0.94  --   --  1.06   < > = values in this interval not displayed.    Estimated Creatinine Clearance: 71.4 mL/min (by C-G formula based on SCr of 1.06 mg/dL).   Medications:  Infusions:   sodium chloride 10 mL/hr at 06/16/21 1021   amiodarone 30 mg/hr (06/16/21 1021)   dexmedetomidine (PRECEDEX) IV infusion Stopped (06/16/21 0824)   heparin 1,650 Units/hr (06/16/21 1021)    Assessment: 65 years of age male who presented on 06/13/2021 with shortness of breath. Patient was transferred to ICU care for management of alcohol withdrawal. Patient went into atrial fibrillation and pharmacy was consulted to dose IV Heparin. Patient was not on any anticoagulation prior to admission.   Heparin level of 026 on heparin 1650 units/hr is sub-therapeutic. Level drawn appropriately per RN. No issues with IV infusion or access. No bleeding noted.  Renal function is stable. Patient is now able to take orals.   Goal of Therapy:  Heparin level 0.3-0.7 units/ml Monitor platelets by anticoagulation protocol: Yes   Plan:  Increase heparin to 1750 units/hr  Check 6 hr heparin level  Monitor heparin level, CBC and s/s of bleeding Will discuss with CCM team regarding change to DOAC or Lovenox.   Sloan Leiter, PharmD, BCPS, BCCCP Clinical Pharmacist Please refer to [facility] for [place] numbers 06/16/2021,10:33 AM

## 2021-06-16 NOTE — Care Management (Signed)
Benefit check sent for DOACs

## 2021-06-16 NOTE — Progress Notes (Signed)
NAME:  Ronald Lowe, MRN:  [id number], DOB:  16-Nov-1956, LOS: 2 ADMISSION DATE:  06/13/2021, CONSULTATION DATE:  6/27 REFERRING MD:  Dr. Erin Hearing, CHIEF COMPLAINT:  Pneumonia; alcohol withdrawal   History of Present Illness:  Patient is a 65 yo M w/ pertinent PMH of COPD (Dr. Mauricio Po patient),  tobacco use d/o, Alcohol use d/o, HLD, HTN presents to [facility] on 6/26 with SOB.  Patient symptoms started 2 days prior to admission. ER course: Positive for myalgias, increased sputum, SOB. Denies chest pain. On room air. SBP 160-170s. WBC normal. Lactic acid 3.5. CXR indicative of RUL and LLL pneumonia. Started on Azithromycin and Ceftriaxone.  Patients last drink on 6/25. Urine ethanol level 55.   PCCM consulted on 6/27 to manage alcohol withdrawal and admit to ICU to start on precedex.  Pertinent  Medical History   Past Medical History:  Diagnosis Date   Chronic kidney disease    Hyperlipidemia    Hypertension    Tobacco abuse      Significant Hospital Events: Including procedures, antibiotic start and stop dates in addition to other pertinent events   6/26: admitted to [facility] for pneumonia. Started on Azithromycin and ceftriaxone 6/27: pccm consulted for alcohol withdrawal. 17 ativan and 5 of haldol given. Admitted to icu and started on precedex.  Interim History / Subjective:  6/29: improved off precedex infusion. Remains on amio as rate is up to 110's at this time. Alert and oriented.  Does state has no interest in quitting drinking.  Stable for transfer to floor at this time. Will ask trh to assume care and ccm will sign off 6/28: transferred to ICU overnight 2/2 afib with rvr, agitation and potential need for intubation. At this time pt appears comfortable, sedated on precedex but spontaneously moving all 4. Wife at bedside and updated.   Objective   Blood pressure (!) 136/97, pulse (!) 102, temperature 97.6 F (36.4 C), temperature source Axillary, resp. rate 18, height 6' (1.829 m),  weight 73.6 kg, SpO2 98 %.    FiO2 (%):  [21 %] 21 %   Intake/Output Summary (Last 24 hours) at 06/16/2021 0818 Last data filed at 06/16/2021 0700 Gross per 24 hour  Intake 1214.92 ml  Output 1025 ml  Net 189.92 ml   Filed Weights   06/14/21 2001 06/16/21 0600  Weight: 74 kg 73.6 kg    Examination: General:  awake following commands, conversant, pleasant  HEENT: ncat , perrla, MM pink/moist Neuro: no focal deficits CV: s1s2, EKG showing Afib HR in 110's PULM:  no wheeze no rales; on room air GI: soft, bsx4 active  Extremities: warm/dry, no edema  Skin: no rashes or lesions   Resolved Hospital Problem list   Acute delirium  Assessment & Plan:  Acute alcohol withdrawal: hx of alcohol use disorder: 6/27 not responding to 17 ativan and 5 of haldol. Sent to ICU and started on Precedex drip Delirium Tremens P: -admit to ICU for telemetry monitoring -off precedex -Continue folate and thiamine -prn Ativan and Haldol  Multifocal Pneumonia Acute COPD exacerbation P: -Continue Azithromycin and Ceftriaxone - urine legionella (pending) and strep (neg) -RVP negative -MRSA PCR neg -dc home inhalers and started Yupelri, brovana, pulmicort nebs -steroids, change to po today for 2 days then stop  Afib RVR:6/27 likely agitation related from alcohol withdrawal P: -telemetry monitoring -amio infusion with rebolus 150mg  at this time. Would hesitate transitioning to oral until rate better controlled -transition to oral a/c   Hypomagnesemia Hypokalemia P: -  Trend and replete as needed  AKI with HTN P: -continue amoldipine -hold lisinopril in setting of aki   Tobacco use P: -nicotine   Best Practice (right click and "Reselect all SmartList Selections" daily)   Diet/type: Regular consistency (see orders) DVT prophylaxis: DOAC per pharmacy GI prophylaxis: N/A Lines: N/A Foley:  N/A Code Status:  full code Last date of multidisciplinary goals of care discussion [with  wife and RN at bedside 6/28]  Labs   CBC: Recent Labs  Lab 06/13/21 1310 06/14/21 2103 06/15/21 0504  WBC 9.1 7.3 6.6  HGB 13.0 11.6* 11.9*  HCT 37.9* 32.4* 33.8*  MCV 103.3* 98.2 99.7  PLT 166 164 174    Basic Metabolic Panel: Recent Labs  Lab 06/13/21 1310 06/14/21 0357 06/14/21 2103 06/15/21 0504  NA 131* 131* 135 134*  K 3.2* 3.7 3.5 4.3  CL 99 98 103 103  CO2 16* 20* 20* 21*  GLUCOSE 218* 181* 123* 163*  BUN 14 16 19 19   CREATININE 1.21 1.10 0.99 0.94  CALCIUM 8.0* 8.3* 8.5* 8.6*  MG  --  1.3* 2.6* 2.6*   GFR: Estimated Creatinine Clearance: 80.5 mL/min (by C-G formula based on SCr of 0.94 mg/dL). Recent Labs  Lab 06/13/21 1310 06/13/21 1406 06/13/21 1606 06/14/21 0357 06/14/21 2103 06/15/21 0504  PROCALCITON  --   --   --   --  21.61 17.30  WBC 9.1  --   --   --  7.3 6.6  LATICACIDVEN  --  7.0* 3.5* 3.2* 2.2*  --     Liver Function Tests: Recent Labs  Lab 06/13/21 2216 06/14/21 2103  AST 36 29  ALT 21 20  ALKPHOS 35* 34*  BILITOT 1.1 0.9  PROT 6.1* 6.0*  ALBUMIN 3.0* 3.0*   No results for input(s): LIPASE, AMYLASE in the last 168 hours. No results for input(s): AMMONIA in the last 168 hours.  ABG    Component Value Date/Time   TCO2 23 04/07/2021 1506     Coagulation Profile: No results for input(s): INR, PROTIME in the last 168 hours.  Cardiac Enzymes: No results for input(s): CKTOTAL, CKMB, CKMBINDEX, TROPONINI in the last 168 hours.  HbA1C: Hemoglobin A1C  Date/Time Value Ref Range Status  03/08/2021 10:59 AM   Corrected    Comment:    ordered in error unable to delete    Hgb A1c MFr Bld  Date/Time Value Ref Range Status  06/14/2021 03:57 AM 5.0 4.8 - 5.6 % Final    Comment:    (NOTE)         Prediabetes: 5.7 - 6.4         Diabetes: >6.4         Glycemic control for adults with diabetes: <7.0     CBG: Recent Labs  Lab 06/14/21 2047 06/14/21 2331 06/15/21 0321  GLUCAP 118* 133* 147*   care time 35 mins. This  represents my time independent of the NPs time taking care of the pt. This is excluding procedures.    Audria Nine DO [place] 06/16/2021, 8:18 AM See Amion for pager If no response to pager, please call [phone] until 1900 After 1900 please call [facility] [phone]

## 2021-06-17 ENCOUNTER — Other Ambulatory Visit (HOSPITAL_COMMUNITY): Payer: Self-pay

## 2021-06-17 DIAGNOSIS — D539 Nutritional anemia, unspecified: Secondary | ICD-10-CM

## 2021-06-17 DIAGNOSIS — F10231 Alcohol dependence with withdrawal delirium: Principal | ICD-10-CM

## 2021-06-17 DIAGNOSIS — F10931 Alcohol use, unspecified with withdrawal delirium: Secondary | ICD-10-CM | POA: Diagnosis present

## 2021-06-17 DIAGNOSIS — I4891 Unspecified atrial fibrillation: Secondary | ICD-10-CM

## 2021-06-17 LAB — CBC
HCT: 36 % — ABNORMAL LOW (ref 39.0–52.0)
Hemoglobin: 12.6 g/dL — ABNORMAL LOW (ref 13.0–17.0)
MCH: 35.1 pg — ABNORMAL HIGH (ref 26.0–34.0)
MCHC: 35 g/dL (ref 30.0–36.0)
MCV: 100.3 fL — ABNORMAL HIGH (ref 80.0–100.0)
Platelets: 212 10*3/uL (ref 150–400)
RBC: 3.59 MIL/uL — ABNORMAL LOW (ref 4.22–5.81)
RDW: 13.7 % (ref 11.5–15.5)
WBC: 8.7 10*3/uL (ref 4.0–10.5)
nRBC: 0 % (ref 0.0–0.2)

## 2021-06-17 MED ORDER — MELATONIN 3 MG PO TABS
3.0000 mg | ORAL_TABLET | Freq: Every day | ORAL | Status: DC
  Administered 2021-06-17: 3 mg via ORAL
  Filled 2021-06-17: qty 1

## 2021-06-17 MED ORDER — NALTREXONE HCL 50 MG PO TABS
50.0000 mg | ORAL_TABLET | Freq: Every day | ORAL | Status: DC
  Administered 2021-06-17 – 2021-06-18 (×2): 50 mg via ORAL
  Filled 2021-06-17 (×2): qty 1

## 2021-06-17 MED ORDER — AMIODARONE HCL 200 MG PO TABS
200.0000 mg | ORAL_TABLET | Freq: Two times a day (BID) | ORAL | Status: DC
  Administered 2021-06-17 – 2021-06-18 (×3): 200 mg via ORAL
  Filled 2021-06-17 (×3): qty 1

## 2021-06-17 MED ORDER — METOPROLOL TARTRATE 12.5 MG HALF TABLET
12.5000 mg | ORAL_TABLET | Freq: Two times a day (BID) | ORAL | Status: DC
  Administered 2021-06-17 – 2021-06-18 (×3): 12.5 mg via ORAL
  Filled 2021-06-17 (×3): qty 1

## 2021-06-17 MED ORDER — HYDROXYZINE HCL 25 MG PO TABS
25.0000 mg | ORAL_TABLET | Freq: Four times a day (QID) | ORAL | Status: DC | PRN
  Administered 2021-06-18: 25 mg via ORAL
  Filled 2021-06-17: qty 1

## 2021-06-17 NOTE — Evaluation (Signed)
Physical Therapy Evaluation Patient Details Name: Ronald Lowe MRN: [id number] DOB: [birth year] Today's Date: 06/17/2021   History of Present Illness  Pt adm 6/27 dyspnea and found to have multifocal PNA. Hospitalization complicated by ETOH withdrawal and afib with RVR. PMH - copd, etoh abuse, htn  Clinical Impression  Pt doing well with mobility and no further PT needed.  Ready for dc from PT standpoint when medically ready.     Follow Up Recommendations No PT follow up    Equipment Recommendations  None recommended by PT    Recommendations for Other Services       Precautions / Restrictions Precautions Precautions: None      Mobility  Bed Mobility               General bed mobility comments: Pt sitting EOB    Transfers Overall transfer level: Modified independent Equipment used: None                Ambulation/Gait Ambulation/Gait assistance: Supervision;Modified independent (Device/Increase time) Gait Distance (Feet): 475 Feet Assistive device: None Gait Pattern/deviations: Step-to pattern;Decreased stride length Gait velocity: decr Gait velocity interpretation: >2.62 ft/sec, indicative of community ambulatory General Gait Details: supervision for management of IV. No gross instability or LOB  Stairs            Wheelchair Mobility    Modified Rankin (Stroke Patients Only)       Balance Overall balance assessment: Mild deficits observed, not formally tested                                           Pertinent Vitals/Pain Pain Assessment: No/denies pain    Home Living Family/patient expects to be discharged to:: Private residence Living Arrangements: Spouse/significant other Available Help at Discharge: Family Type of Home: House Home Access: Stairs to enter   Technical brewer of Steps: 1 Home Layout: One level Home Equipment: None      Prior Function Level of Independence: Independent          Comments: drive     Hand Dominance        Extremity/Trunk Assessment   Upper Extremity Assessment Upper Extremity Assessment: Defer to OT evaluation    Lower Extremity Assessment Lower Extremity Assessment: Overall WFL for tasks assessed       Communication   Communication: No difficulties  Cognition Arousal/Alertness: Awake/alert Behavior During Therapy: WFL for tasks assessed/performed Overall Cognitive Status: No family/caregiver present to determine baseline cognitive functioning Area of Impairment: Safety/judgement                         Safety/Judgement: Decreased awareness of safety            General Comments General comments (skin integrity, edema, etc.): VSS    Exercises     Assessment/Plan    PT Assessment Patent does not need any further PT services  PT Problem List         PT Treatment Interventions      PT Goals (Current goals can be found in the Care Plan section)  Acute Rehab PT Goals PT Goal Formulation: All assessment and education complete, DC therapy    Frequency     Barriers to discharge        Co-evaluation               AM-PAC  PT "6 Clicks" Mobility  Outcome Measure Help needed turning from your back to your side while in a flat bed without using bedrails?: None Help needed moving from lying on your back to sitting on the side of a flat bed without using bedrails?: None Help needed moving to and from a bed to a chair (including a wheelchair)?: None Help needed standing up from a chair using your arms (e.g., wheelchair or bedside chair)?: None Help needed to walk in hospital room?: None Help needed climbing 3-5 steps with a railing? : A Little 6 Click Score: 23    End of Session   Activity Tolerance: Patient tolerated treatment well Patient left: in chair;with call bell/phone within reach Nurse Communication: Mobility status;Other (comment) (Nurse tech. Bed stripped and needed to be remade) PT Visit  Diagnosis: Other abnormalities of gait and mobility (R26.89)    Time: 5397-6734 PT Time Calculation (min) (ACUTE ONLY): 22 min   Charges:   PT Evaluation $PT Eval Low Complexity: [place] Pager [phone] Office [place] 06/17/2021, 1:58 PM

## 2021-06-17 NOTE — Progress Notes (Addendum)
Family Medicine Teaching Service Daily Progress Note Intern Pager: [phone]  Patient name: Ronald Lowe Medical record number: [id number] Date of birth: [birth year] Age: 65 y.o. Gender: male  Primary Care Provider: Nicolette Bang, MD Consultants: CCM (s/o), cardiology Code Status: Full  Pt Overview and Major Events to Date:  6/26 - Admitted 6/27 - Transferred to ICU and on Precedex drip  Assessment and Plan: Ronald Lowe is a 65 y.o. male who presented with dyspnea and found to have multifocal PNA and later underwent acute alcohol withdrawal. PMH significant for COPD, alcohol use disorder, HLD, HTN, tobacco use disorder  Alcohol withdrawal  Delirium Tremens  Polysubstance abuse LFTs on admission were within normal limits.  Patient just transferred out of the ICU off of Precedex drip.  Last CIWA's have been 0.  Of note, UDS on 6/29 showed positive for cocaine, benzos, THC. Patient agreeable to naltrexone for decreasing alcohol craving. - Continue thiamine and folic acid supplements - CIWA protocols - Discontinued PRN Ativan - Hydroxyzine 20 mg q6h PRN - Discontinued as needed haloperidol  -- CMP in the AM -- PT/OT eval and treat -- Start Naltrexone  A. fib with RVR Heart rate currently in the low 100s, patient appears stable.  Noted on 6/27 likely related to alcohol withdrawal.  Patient was placed on amiodarone drip while in the ICU.  Potassium 4.1 today. - Consulting cardiology, appreciate recommendations - Currently on amiodarone drip. - Continue Eliquis 5 mg twice daily -- Follow-up echocardiogram -- Follow-up TSH in the AM  COPD exacerbation  multifocal pneumonia Patient is s/p treatment with ceftriaxone (x3 doses) and azithromycin (x1 dose IV and 1 dose oral), which would not be an adequate treatment if this was truly a multifocal pneumonia and will need consideration on if we need to continue this vs not and having close follow-up. Given patient does have  some clinical improvement.  Patient is continued on prednisone 40 mg daily. CCM collected urine Legionella testing which is pending. - Continue prednisone 40 mg daily (to be finished today?) - Revefenacin 175 MCG nebulization daily - Pulmicort nebulizer twice daily - Arformoterol nebulization twice daily -- PT/OT eval and treat  Difficulty with sleep Patient reports that he has not been able to sleep for more than an hour due to being woken up constantly in an uncomfortable bed.  Requesting something for sleep. - Trial of melatonin tonight  HTN BP in the last 24 hours ranged from 118-140/69-29.  Home medications include lisinopril 5mg  and amlodipine 5mg  daily. Lisinopril was initially held due to an AKI, BPs have overall been well managed without at this time.  We will continue to monitor and add back as appropriate. - Continue amlodipine 5 mg daily  FEN/GI: Soft diet PPx: On Eliquis Dispo:Home pending clinical improvement . Barriers include continued medical work-up.   Subjective:  Patient is wondering when he can go home.  He has no other issues but would like to be able to get out the hospital. Reports that he does not have any palpitations, shortness of breath.  He does report that he has a cough but that he has been dealing with a "for a long time".  Objective: Temp:  [97.6 F (36.4 C)-97.9 F (36.6 C)] 97.9 F (36.6 C) (06/29 1919) Pulse Rate:  [79-106] 106 (06/29 1938) Resp:  [18-19] 19 (06/29 1938) BP: (118-140)/(69-99) 118/69 (06/29 1919) SpO2:  [99 %-100 %] 99 % (06/30 0731) Physical Exam: General: NAD, supine in bed, overall well-appearing Cardiovascular:  Tachycardic, irregular rate, no murmur appreciated Respiratory: Breathing easily on room air, no increased work of breathing, cough noted upon examination Abdomen: Soft, nontender, nondistended, bowel sounds present Extremities: Moving all extremities equally and appropriate, no peripheral edema noted, no tremors noted  during examination  Laboratory: Recent Labs  Lab 06/15/21 0504 06/16/21 0725 06/17/21 0307  WBC 6.6 4.9 8.7  HGB 11.9* 12.9* 12.6*  HCT 33.8* 36.8* 36.0*  PLT 150 210 212   Recent Labs  Lab 06/13/21 2216 06/14/21 0357 06/14/21 2103 06/15/21 0504 06/16/21 0725  NA  --    < > 135 134* 139  K  --    < > 3.5 4.3 4.1  CL  --    < > 103 103 109  CO2  --    < > 20* 21* 21*  BUN  --    < > 19 19 25*  CREATININE  --    < > 0.99 0.94 1.06  CALCIUM  --    < > 8.5* 8.6* 8.5*  PROT 6.1*  --  6.0*  --   --   BILITOT 1.1  --  0.9  --   --   ALKPHOS 35*  --  34*  --   --   ALT 21  --  20  --   --   AST 36  --  29  --   --   GLUCOSE  --    < > 123* 163* 135*   < > = values in this interval not displayed.     Imaging/Diagnostic Tests: No results found.   Rise Patience, DO 06/17/2021, 8:51 AM PGY-1, [facility] Intern pager: [phone], text pages welcome

## 2021-06-17 NOTE — TOC Benefit Eligibility Note (Signed)
Transition of Care [facility]) Benefit Eligibility Note    Patient Details  Name: Ronald Lowe MRN: [id number] Date of Birth: 1956/06/29   Medication/Dose: RIVAROXABAN : NON-FORMULARY , XARELTO 20 MG BID COVER- YES  Covered?: Yes  Tier: 3 Drug  Prescription Coverage Pref[facility]: [facility]  [place] with Person/Company/Phone Number:: [employer] @  [place] [facility] #  [phone]  Co-Pay: $4.00  Prior Approval: No  Deductible:  (NO DEDUCTIBLE  and  PT'S HAS LOWE INCOME SUBSIDY LEVEL-2  and  MEDICAID OF [place])  Additional Notes: APIXABAN : NON-FORMULARY   ELIQUIS  5 MG BID COVER- YES  CO-PAY- $4.00  TIER- 3 DRUG  P/A-NO    Memory Argue Phone Number: 06/17/2021, 11:47 AM

## 2021-06-17 NOTE — TOC Benefit Eligibility Note (Addendum)
Patient Teacher, English as a foreign language completed.    The patient is currently admitted and upon discharge could be taking Eliquis 5 mg.  The current 30 day co-pay is, $4.00.   The patient is currently admitted and upon discharge could be taking Xarelto 20 mg.  The current 30 day co-pay is, $4.00.   The patient is currently admitted and upon discharge could be taking Trelegy Ellipta 200 mcg.  The current 30 day co-pay is, $4.00.   The patient is insured through [place], [place] Patient Advocate Specialist [place] Team Direct Number: [phone]  Fax: [phone]

## 2021-06-17 NOTE — Consult Note (Signed)
Cardiology Consultation:   Patient ID: Ronald Lowe; [id number]; 1956/09/24   Admit date: 06/13/2021 Date of Consult: 06/17/2021  Primary Care Provider: Nicolette Bang, MD Primary Cardiologist: New to [facility]-seen by Dr. Percival Spanish 2014  Patient Profile:   Ronald Lowe is a 65 y.o. male with a hx of HTN, HLD,  COPD with tobacco use and ETOH/marijuana use who is being seen today for the evaluation of new onset atrial fibrillation at the request of Dr. Oleh Genin.  History of Present Illness:   Ronald Lowe is a 65yo M with a hx as stated above who presented to [facility] 06/13/21 with a 2-day history of shortness of breath and all over body pain felt to be be due to a hang-over from drinking too much alcohol the evening prior.  He was previously prescribed inhalers for which he reported using these prior to presentation.  Given his persistent symptoms, he presented to the ED for further evaluation.  On arrival, he was hypokalemic with a K+ 3.2.  Lactic acid initially at 7.0 with improvement to 3.5>>2.2.  CXR with new reticulonodular opacities in the right upper lobe and left lower lobe concerning for multifocal pneumonia.  Subsequent CTA showed no evidence of PE with areas of consolidation compatible with multifocal pneumonia.  Also noted to have coronary artery and aortic calcifications.   He was initially placed on empiric antibiotic therapy along with IV fluid resuscitation.  Unfortunately, due to EtOH abuse he began showing signs and symptoms of acute alcohol withdrawal requiring transfer to ICU with CIWA protocol and Precedex therapies.  Symptoms have now resolved.  On my exam, he is up sitting in a chair with no complaints of chest pain, palpitations, LE edema, shortness of breath, dizziness or syncope.  He does state he has recently been decreasing 2 of his home medications due to mild dizziness.  Appears he takes amlodipine 5, lisinopril 5>> likely the 2 medications he has been self  decreasing.  He follows with regularly with his primary care physician and made her aware.  He has no signs and symptoms of fluid volume overload on exam.  Primary team has ordered an echocardiogram however this is not yet been performed.  Prior echocardiogram from 2013 with LVEF at 60 to 65% with normal diastolic parameters.  He was previously seen in 2014 for the evaluation of syncope with no prior CAD. At initial consultation he reported three prior episodes of positional near syncope when going from lying to standing positions secondary to orthostasis. He was educated on how to avoid this. He had previously undergone an echocardiogram in 2013 as above. Carotid artery ultrasound showed no significant extracranial carotid artery stenosis.  THere was no further cardiac work up performed. He has not been seen since that time.   Past Medical History:  Diagnosis Date   Chronic kidney disease    Hyperlipidemia    Hypertension    Tobacco abuse     Past Surgical History:  Procedure Laterality Date   TONSILLECTOMY       Prior to Admission medications   Medication Sig Start Date End Date Taking? Authorizing Provider  albuterol (VENTOLIN HFA) 108 (90 Base) MCG/ACT inhaler Inhale 2 puffs into the lungs every 6 (six) hours as needed for wheezing or shortness of breath. 05/05/21  Yes Nicolette Bang, MD  amLODipine (NORVASC) 5 MG tablet TAKE 1 TABLET (5 MG TOTAL) BY MOUTH EVERY OTHER DAY. DX: I10 Patient taking differently: Take 5 mg by mouth  every other day. 05/20/21  Yes Nicolette Bang, MD  atorvastatin (LIPITOR) 40 MG tablet Take 1 tablet (40 mg total) by mouth daily. 03/25/21  Yes Nicolette Bang, MD  fluticasone-salmeterol (ADVAIR HFA) [phone] MCG/ACT inhaler INHALE 2 PUFFS INTO THE LUNGS TWICE A DAY Patient taking differently: Inhale 2 puffs into the lungs 2 (two) times daily. 05/26/21  Yes Nicolette Bang, MD  lisinopril (ZESTRIL) 5 MG tablet TAKE 0.5 TABLETS (2.5  MG TOTAL) BY MOUTH EVERY OTHER DAY. Patient taking differently: Take 2.5 mg by mouth every other day. 04/16/21  Yes Nicolette Bang, MD  pantoprazole (PROTONIX) 40 MG tablet Take 1 tablet (40 mg total) by mouth daily. 05/26/21  Yes Nicolette Bang, MD  umeclidinium bromide (INCRUSE ELLIPTA) 62.5 MCG/INH AEPB Inhale 1 puff into the lungs daily. 04/22/21  Yes Spero Geralds, MD    Inpatient Medications: Scheduled Meds:  amLODipine  5 mg Oral Daily   apixaban  5 mg Oral BID   arformoterol  15 mcg Nebulization BID   atorvastatin  40 mg Oral Daily   budesonide (PULMICORT) nebulizer solution  0.25 mg Nebulization BID   Chlorhexidine Gluconate Cloth  6 each Topical Daily   folic acid  1 mg Oral Daily   mouth rinse  15 mL Mouth Rinse BID   melatonin  3 mg Oral QHS   multivitamin with minerals  1 tablet Oral Daily   naltrexone  50 mg Oral Daily   nicotine  14 mg Transdermal Daily   pantoprazole  40 mg Oral QHS   revefenacin  175 mcg Nebulization Daily   thiamine  100 mg Oral Daily   Continuous Infusions:  sodium chloride 10 mL/hr at 06/16/21 1021   amiodarone 30 mg/hr (06/17/21 0621)   PRN Meds: sodium chloride, acetaminophen **OR** acetaminophen, albuterol, hydrOXYzine  Allergies:   No Known Allergies  Social History:   Social History   Socioeconomic History   Marital status: Married    Spouse name: Not on file   Number of children: 2   Years of education: Not on file   Highest education level: Not on file  Occupational History   Occupation: retired  Tobacco Use   Smoking status: Every Day    Packs/day: 1.00    Years: 50.00    Pack years: 50.00    Types: Cigarettes    Start date: 12/19/1970   Smokeless tobacco: Never  Vaping Use   Vaping Use: Never used  Substance and Sexual Activity   Alcohol use: Yes    Alcohol/week: 42.0 standard drinks    Types: 28 Cans of beer, 14 Shots of liquor per week    Comment: did not drink yesterday, was shaky today and  wife gave him one shot pta   Drug use: No   Sexual activity: Yes    Partners: Female    Birth control/protection: None  Other Topics Concern   Not on file  Social History Narrative   Lives with girlfriend.     Social Determinants of Health   Financial Resource Strain: Not on file  Food Insecurity: Not on file  Transportation Needs: Not on file  Physical Activity: Not on file  Stress: Not on file  Social Connections: Not on file  Intimate Partner Violence: Not on file    Family History:   Family History  Problem Relation Age of Onset   Emphysema Father    CAD Father        CABG   Stroke  Mother    CAD Brother 21       CABG   Hypertension Brother    Family Status:  Family Status  Relation Name Status   Father  Deceased   Mother  Deceased   Brother  (Not Specified)    ROS:  Please see the history of present illness.  All other ROS reviewed and negative.     Physical Exam/Data:   Vitals:   06/16/21 1524 06/16/21 1919 06/16/21 1938 06/17/21 0731  BP: (!) 140/99 118/69    Pulse: 79 (!) 106 (!) 106   Resp: 18 19 19    Temp: 97.8 F (36.6 C) 97.9 F (36.6 C)    TempSrc:  Oral    SpO2: 99% 100% 100% 99%  Weight:      Height:        Intake/Output Summary (Last 24 hours) at 06/17/2021 1131 Last data filed at 06/17/2021 0528 Gross per 24 hour  Intake --  Output 800 ml  Net -800 ml   Filed Weights   06/14/21 2001 06/16/21 0600  Weight: 74 kg 73.6 kg   Body mass index is 22.01 kg/m.   General: Well developed, well nourished, NAD Neck: Negative for carotid bruits. No JVD Lungs: Bilateral lower lobe rhonchi with diminished upper lobes. Breathing is unlabored. Cardiovascular: RRR with S1 S2. No murmurs Abdomen: Soft, non-tender, non-distended. No obvious abdominal masses. Extremities: No edema. Neuro: Alert and oriented. No focal deficits. No facial asymmetry. MAE spontaneously. Psych: Responds to questions appropriately with normal affect.    EKG:  The  EKG was personally reviewed and demonstrates:  06/17/21 NSR  Telemetry:  Telemetry was personally reviewed and demonstrates: 06/17/2021 NSR with stable rates  Relevant CV Studies:  Echocardiogram 05/16/2012:  Study Conclusions   Left ventricle: The cavity size was normal. Wall thickness  was normal. Systolic function was normal. The estimated  ejection fraction was in the range of 60% to 65%. Left  ventricular diastolic function parameters were normal.         Transthoracic echocardiography.  M-mode, complete 2D,  spectral Doppler, and color Doppler.  Height:  Height:  182.9cm. Height: 72in.  Weight:  Weight: 77.3kg. Weight:  170lb.  Body mass index:  BMI: 23.1kg/m^2.  Body surface  area:    BSA: 1.29m^2.  Patient status:  Inpatient.  Location:  Echo laboratory.  Laboratory Data:  Chemistry Recent Labs  Lab 06/14/21 2103 06/15/21 0504 06/16/21 0725  NA 135 134* 139  K 3.5 4.3 4.1  CL 103 103 109  CO2 20* 21* 21*  GLUCOSE 123* 163* 135*  BUN 19 19 25*  CREATININE 0.99 0.94 1.06  CALCIUM 8.5* 8.6* 8.5*  GFRNONAA >60 >60 >60  ANIONGAP 12 10 9     Total Protein  Date Value Ref Range Status  06/14/2021 6.0 (L) 6.5 - 8.1 g/dL Final  09/24/2019 7.6 6.0 - 8.5 g/dL Final   Albumin  Date Value Ref Range Status  06/14/2021 3.0 (L) 3.5 - 5.0 g/dL Final  09/24/2019 4.9 (H) 3.8 - 4.8 g/dL Final   AST  Date Value Ref Range Status  06/14/2021 29 15 - 41 U/L Final   ALT  Date Value Ref Range Status  06/14/2021 20 0 - 44 U/L Final   Alkaline Phosphatase  Date Value Ref Range Status  06/14/2021 34 (L) 38 - 126 U/L Final   Total Bilirubin  Date Value Ref Range Status  06/14/2021 0.9 0.3 - 1.2 mg/dL Final   Bilirubin Total  Date Value Ref Range Status  09/24/2019 1.0 0.0 - 1.2 mg/dL Final   Hematology Recent Labs  Lab 06/15/21 0504 06/16/21 0725 06/17/21 0307  WBC 6.6 4.9 8.7  RBC 3.39* 3.69* 3.59*  HGB 11.9* 12.9* 12.6*  HCT 33.8* 36.8* 36.0*  MCV 99.7 99.7  100.3*  MCH 35.1* 35.0* 35.1*  MCHC 35.2 35.1 35.0  RDW 13.3 13.6 13.7  PLT 150 210 212   Cardiac EnzymesNo results for input(s): TROPONINI in the last 168 hours. No results for input(s): TROPIPOC in the last 168 hours.  BNPNo results for input(s): BNP, PROBNP in the last 168 hours.  DDimer No results for input(s): DDIMER in the last 168 hours. TSH: No results found for: TSH Lipids: Lab Results  Component Value Date   CHOL 154 03/24/2021   HDL 74 03/24/2021   LDLCALC 66 03/24/2021   TRIG 74 03/24/2021   CHOLHDL 2.1 03/24/2021   HgbA1c: Lab Results  Component Value Date   HGBA1C 5.0 06/14/2021    Radiology/Studies:  DG Chest 2 View  Result Date: 06/13/2021 CLINICAL DATA:  Shortness of breath. EXAM: CHEST - 2 VIEW COMPARISON:  Chest x-ray dated June 01, 2021. FINDINGS: The heart size and mediastinal contours are within normal limits. Normal pulmonary vascularity. The lungs remain hyperinflated. New reticulonodular opacities in the right upper lobe and left lower lobe. No pleural effusion or pneumothorax. No acute osseous abnormality. IMPRESSION: 1. New reticulonodular opacities in the right upper lobe and left lower lobe, concerning for multifocal pneumonia. Electronically Signed   By: Titus Dubin M.D.   On: 06/13/2021 13:37   CT Angio Chest Pulmonary Embolism (PE) W or WO Contrast  Result Date: 06/14/2021 CLINICAL DATA:  Shortness of breath EXAM: CT ANGIOGRAPHY CHEST WITH CONTRAST TECHNIQUE: Multidetector CT imaging of the chest was performed using the standard protocol during bolus administration of intravenous contrast. Multiplanar CT image reconstructions and MIPs were obtained to evaluate the vascular anatomy. CONTRAST:  39mL OMNIPAQUE IOHEXOL 350 MG/ML SOLN COMPARISON:  03/16/2021 FINDINGS: Cardiovascular: No filling defects in the pulmonary arteries to suggest pulmonary emboli. Heart is normal size. Aorta is normal caliber. Coronary artery and aortic calcifications.  Mediastinum/Nodes: No mediastinal, hilar, or axillary adenopathy. Trachea and esophagus are unremarkable. Thyroid unremarkable. Lungs/Pleura: Airspace disease posteriorly in the right upper lobe as well as posteriorly in the left lower lobe compatible with multifocal pneumonia. Trace right pleural effusion. Upper Abdomen: Imaging into the upper abdomen demonstrates no acute findings. Musculoskeletal: Chest wall soft tissues are unremarkable. No acute bony abnormality. Review of the MIP images confirms the above findings. IMPRESSION: No evidence of pulmonary embolus. Areas of consolidation noted in the posterior right upper lobe and posterior left lower lobe compatible with multifocal pneumonia. Trace right pleural effusion. Coronary artery disease. Aortic Atherosclerosis (ICD10-I70.0). Electronically Signed   By: Rolm Baptise M.D.   On: 06/14/2021 02:18   DG CHEST PORT 1 VIEW  Result Date: 06/15/2021 CLINICAL DATA:  Pneumonia EXAM: PORTABLE CHEST 1 VIEW COMPARISON:  06/13/2021.  CT 06/14/2021 FINDINGS: Patchy airspace disease in the left lower lobe and medial right upper lobe compatible with pneumonia as seen on earlier chest CT. Heart is normal size. No effusions or pneumothorax. No acute bony abnormality. IMPRESSION: Continued right upper lobe and left lower lobe airspace opacities/pneumonia. Electronically Signed   By: Rolm Baptise M.D.   On: 06/15/2021 00:21   US Abdomen Limited RUQ (LIVER/GB)  Result Date: 06/14/2021 CLINICAL DATA:  Right upper quadrant pain EXAM: ULTRASOUND ABDOMEN LIMITED  RIGHT UPPER QUADRANT COMPARISON:  06/01/2021 FINDINGS: Gallbladder: No gallstones or wall thickening visualized. No sonographic Murphy sign noted by sonographer. Common bile duct: Diameter: Mildly prominent, 8 mm Liver: No focal lesion identified. Within normal limits in parenchymal echogenicity. Portal vein is patent on color Doppler imaging with normal direction of blood flow towards the liver. Other: Small right  pleural effusion IMPRESSION: No acute findings in the right upper quadrant. Small right pleural effusion. Electronically Signed   By: Rolm Baptise M.D.   On: 06/14/2021 00:43    Assessment and Plan:   1.  New onset atrial fibrillation: -Patient presented with a 2-day history of generalized fatigue and shortness of breath found to have CXR and CTA both concerning for multifocal PNA.  Lactic acid elevated on admission at 7.0>>3.5>>3.2.  He was started overload empiric antibiotics per primary team along with Solu-Medrol.  Unfortunately, on 06/14/2021 PCCM consulted via E link for new onset atrial fibrillation with RVR. -He was started on IV amiodarone along with IV heparin for anticoagulation -IV heparin transition to p.o. Eliquis 5 mg twice daily -Currently maintaining normal sinus rhythm with stable rates -Likely new onset A. fib in the setting of alcohol abuse along with acute infection with PNA -Would plan to transition to p.o. amiodarone however low likelihood he will need antiarrhythmic long-term.   -Plan for 200 mg twice daily x1 week then stop.  -Would add low-dose beta-blocker and discontinue home lisinopril -Start metoprolol 12.5 mg twice daily with the intention to transition to Toprol XL prior to discharge -Echocardiogram pending -CHA2DS2VASc score= 2   2.  Multifocal left lower lobe/right upper lobe posterior PNA: -CTA and CXR on ED admission concerning for multifocal PNA therefore he was started on empiric antibiotics>> Short course with improvement.  Felt to be possibly viral etiology due to rapid improvement -Management per primary team/PCCM  3.  Acute alcohol withdrawal: -Patient has known history of alcohol abuse who subsequently developed acute alcohol withdrawal and agitation now treated with CIWA protocol -He was transferred to ICU and started on Precedex infusion -Symptoms seem to be fully resolved at this time -Management per primary team/PCCM  4.  COPD with ongoing  tobacco use: -Followed with outpatient pulmonology -Continue management per primary team/pulmonary medicine  5.  Coronary and aortic calcifications: -Seen on chest CTA on ED admission -Denies anginal symptoms -We will risk stratify with lipid panel, hemoglobin A1c -No ASA given anticoagulation -Continue statin -Start beta-blocker   For questions or updates, please contact [facility] Please consult [email or web address] for contact info under Cardiology/STEMI.   SignedKathyrn Drown NP-C [facility] Pager: [phone] 06/17/2021 11:31 AM

## 2021-06-17 NOTE — Discharge Instructions (Addendum)
You were admitted to the hospital due to your shortness of breath. We were concerned that you might have had pneumonia as well as a COPD exacerbation.  During this hospitalization, you went into alcohol withdrawal which required you to be replaced into the ICU.  Your heart also went into a irregular rhythm called atrial fibrillation which required IV medications and management by cardiology.  It is very important that you continue taking the medication called amiodarone to help keep your heart rhythm regular. You will take this twice per day until next Wednesday, July 6th. At that point, you will switch to taking one pill per day for 30 days. We are also putting you on a medication called Eliquis that will help to prevent a stroke if your heart goes back into this rhythm. Please be sure to make a follow-up appointment with your primary care doctor, Dr. Juleen China, in the next 7-14 days.  Alcohol cessation will be very important to keep your medical conditions stable at this time.  While you are here you were started on naltrexone which should help decrease your alcohol cravings; and you are also started on vitamin supplementation that we recommend for patients with significant alcohol use (thiamine and folic acid). There are many other resources available to help you quit alcohol, your primary doctor can help you access these.   We are also recommending that you have a repeat Chest X Ray in 6 weeks to follow up on your COPD and possible pneumonia. We will also let your primary doctor know this.   ---------------------------------------------------------------------------------------------------------------------------------------- Information on my medicine - ELIQUIS (apixaban)  This medication education was reviewed with me or my healthcare representative as part of my discharge preparation.     Why was Eliquis prescribed for you? Eliquis was prescribed for you to reduce the risk of a blood clot  forming that can cause a stroke if you have a medical condition called atrial fibrillation (a type of irregular heartbeat).  What do You need to know about Eliquis ? Take your Eliquis TWICE DAILY - one tablet in the morning and one tablet in the evening with or without food. If you have difficulty swallowing the tablet whole please discuss with your pharmacist how to take the medication safely.  Take Eliquis exactly as prescribed by your doctor and DO NOT stop taking Eliquis without talking to the doctor who prescribed the medication.  Stopping may increase your risk of developing a stroke.  Refill your prescription before you run out.  After discharge, you should have regular check-up appointments with your healthcare provider that is prescribing your Eliquis.  In the future your dose may need to be changed if your kidney function or weight changes by a significant amount or as you get older.  What do you do if you miss a dose? If you miss a dose, take it as soon as you remember on the same day and resume taking twice daily.  Do not take more than one dose of ELIQUIS at the same time to make up a missed dose.  Important Safety Information A possible side effect of Eliquis is bleeding. You should call your healthcare provider right away if you experience any of the following: Bleeding from an injury or your nose that does not stop. Unusual colored urine (red or dark brown) or unusual colored stools (red or black). Unusual bruising for unknown reasons. A serious fall or if you hit your head (even if there is no bleeding).  Some medicines  may interact with Eliquis and might increase your risk of bleeding or clotting while on Eliquis. To help avoid this, consult your healthcare provider or pharmacist prior to using any new prescription or non-prescription medications, including herbals, vitamins, non-steroidal anti-inflammatory drugs (NSAIDs) and supplements.  This website has more  information on Eliquis (apixaban): http://www.eliquis.com/eliquis/home   We are discharging you on a new inhaler called Trelegy.  You will use this in place of your other inhalers.  You can continue to use the albuterol as needed for breakthrough symptoms.  You should not need to use this very often while on the Trelegy.  You will use the Trelegy once per day.  You are also being discharged on amiodarone that was started by cardiology and he should continue taking this as indicated.  I recommend that you stop your lisinopril until your primary doctor tells you to restart it.  You may not need to restart it according to your blood pressure and kidney function.  We are also sending you out with naltrexone which can help with alcohol cravings

## 2021-06-18 ENCOUNTER — Inpatient Hospital Stay (HOSPITAL_COMMUNITY): Payer: Medicare Other

## 2021-06-18 ENCOUNTER — Other Ambulatory Visit (HOSPITAL_COMMUNITY): Payer: Self-pay

## 2021-06-18 DIAGNOSIS — I4891 Unspecified atrial fibrillation: Secondary | ICD-10-CM

## 2021-06-18 LAB — CBC
HCT: 37.1 % — ABNORMAL LOW (ref 39.0–52.0)
Hemoglobin: 13.1 g/dL (ref 13.0–17.0)
MCH: 35 pg — ABNORMAL HIGH (ref 26.0–34.0)
MCHC: 35.3 g/dL (ref 30.0–36.0)
MCV: 99.2 fL (ref 80.0–100.0)
Platelets: 235 10*3/uL (ref 150–400)
RBC: 3.74 MIL/uL — ABNORMAL LOW (ref 4.22–5.81)
RDW: 13.4 % (ref 11.5–15.5)
WBC: 7.1 10*3/uL (ref 4.0–10.5)
nRBC: 0 % (ref 0.0–0.2)

## 2021-06-18 LAB — ECHOCARDIOGRAM COMPLETE
Area-P 1/2: 4.41 cm2
Height: 72 in
S' Lateral: 2.9 cm
Weight: 2596.14 oz

## 2021-06-18 LAB — CULTURE, BLOOD (ROUTINE X 2)
Culture: NO GROWTH
Culture: NO GROWTH

## 2021-06-18 LAB — COMPREHENSIVE METABOLIC PANEL
ALT: 27 U/L (ref 0–44)
AST: 20 U/L (ref 15–41)
Albumin: 2.9 g/dL — ABNORMAL LOW (ref 3.5–5.0)
Alkaline Phosphatase: 43 U/L (ref 38–126)
Anion gap: 8 (ref 5–15)
BUN: 16 mg/dL (ref 8–23)
CO2: 24 mmol/L (ref 22–32)
Calcium: 8.9 mg/dL (ref 8.9–10.3)
Chloride: 105 mmol/L (ref 98–111)
Creatinine, Ser: 1.01 mg/dL (ref 0.61–1.24)
GFR, Estimated: >60 mL/min (ref >60)
Glucose, Bld: 132 mg/dL — ABNORMAL HIGH (ref 70–99)
Potassium: 3.6 mmol/L (ref 3.5–5.1)
Sodium: 137 mmol/L (ref 135–145)
Total Bilirubin: 0.9 mg/dL (ref 0.3–1.2)
Total Protein: 5.8 g/dL — ABNORMAL LOW (ref 6.5–8.1)

## 2021-06-18 LAB — TSH: TSH: 1.009 u[IU]/mL (ref 0.350–4.500)

## 2021-06-18 MED ORDER — NICOTINE 14 MG/24HR TD PT24
14.0000 mg | MEDICATED_PATCH | Freq: Every day | TRANSDERMAL | 0 refills | Status: DC
  Filled 2021-06-18: qty 28, 28d supply, fill #0

## 2021-06-18 MED ORDER — THIAMINE HCL 100 MG PO TABS
100.0000 mg | ORAL_TABLET | Freq: Every day | ORAL | 0 refills | Status: DC
  Filled 2021-06-18: qty 30, 30d supply, fill #0

## 2021-06-18 MED ORDER — AMIODARONE HCL 200 MG PO TABS
ORAL_TABLET | ORAL | 0 refills | Status: DC
  Filled 2021-06-18: qty 42, 30d supply, fill #0

## 2021-06-18 MED ORDER — METOPROLOL TARTRATE 25 MG PO TABS
12.5000 mg | ORAL_TABLET | Freq: Two times a day (BID) | ORAL | 0 refills | Status: DC
  Filled 2021-06-18: qty 30, 30d supply, fill #0

## 2021-06-18 MED ORDER — APIXABAN 5 MG PO TABS
5.0000 mg | ORAL_TABLET | Freq: Two times a day (BID) | ORAL | 0 refills | Status: DC
  Filled 2021-06-18: qty 60, 30d supply, fill #0

## 2021-06-18 MED ORDER — FOLIC ACID 1 MG PO TABS
1.0000 mg | ORAL_TABLET | Freq: Every day | ORAL | 0 refills | Status: DC
  Filled 2021-06-18: qty 30, 30d supply, fill #0

## 2021-06-18 MED ORDER — CERTAVITE/ANTIOXIDANTS PO TABS
1.0000 | ORAL_TABLET | Freq: Every day | ORAL | 0 refills | Status: DC
  Filled 2021-06-18: qty 30, 30d supply, fill #0

## 2021-06-18 MED ORDER — TRELEGY ELLIPTA 100-62.5-25 MCG/INH IN AEPB
1.0000 | INHALATION_SPRAY | Freq: Every day | RESPIRATORY_TRACT | 0 refills | Status: DC
Start: 2021-06-18 — End: 2021-10-19
  Filled 2021-06-18: qty 60, 30d supply, fill #0

## 2021-06-18 MED ORDER — NALTREXONE HCL 50 MG PO TABS
50.0000 mg | ORAL_TABLET | Freq: Every day | ORAL | 0 refills | Status: DC
  Filled 2021-06-18: qty 30, 30d supply, fill #0

## 2021-06-18 NOTE — TOC CAGE-AID Note (Signed)
Transition of Care [facility]) - CAGE-AID Screening   Patient Details  Name: Ronald Lowe MRN: [id number] Date of Birth: Jan 26, 1956  [facility] CM/SW Contact:    Joanne Chars, LCSW Phone Number: 06/18/2021, 9:38 AM   Clinical Narrative:CSW met with pt to complete Cage aid.  When asked about drinking pattern, pt responds "none this week."  CSW noted pt has been hospitalized.  Pt then reports daily use of alcohol, usually around 4 beers, for many years.  Pt denies drug use, despite UDS positive for benzo, cocaine, marijuana.  CSW engaged pt in conversation regarding impact of alcohol use on his health and whether he had considered if he needs to stop.  Pt did participate, states "it's too early to think about things like this" but did accept resource list.  Pt also acknowledged that his father was alcoholic and that he knows that it could impact his health and even his life.      CAGE-AID Screening:    Have You Ever Felt You Ought to Cut Down on Your Drinking or Drug Use?: Yes Have People Annoyed You By Critizing Your Drinking Or Drug Use?: No Have You Felt Bad Or Guilty About Your Drinking Or Drug Use?: No Have You Ever Had a Drink or Used Drugs First Thing In The Morning to Steady Your Nerves or to Get Rid of a Hangover?: No CAGE-AID Score: 1  Substance Abuse Education Offered: Yes  Substance abuse interventions: Patient Counseling, Other (must comment) Civil engineer, contracting)

## 2021-06-18 NOTE — Progress Notes (Signed)
  Echocardiogram 2D Echocardiogram has been performed.  Ronald Lowe 06/18/2021, 10:19 AM

## 2021-06-18 NOTE — Discharge Summary (Addendum)
[facility] Discharge Summary  Patient name: Ronald Lowe Medical record number: [id number] Date of birth: Apr 28, 1956 Age: 65 y.o. Gender: male Date of Admission: 06/13/2021  Date of Discharge: 06/18/2021 Admitting Physician: Lyndee Hensen, DO  Primary Care Provider: Nicolette Bang, MD Consultants: PCCM, Cardiology  Indication for Hospitalization: Dyspnea, etOH withdrawal  Discharge Diagnoses/Problem List:  Principal Problem:   Alcohol withdrawal delirium ([place]) Active Problems:   HTN (hypertension)   Tobacco dependence   Emphysema lung ([place])   CAP (community acquired pneumonia)   COPD exacerbation ([place])   Multifocal pneumonia   Atrial fibrillation with rapid ventricular response ([place])   Macrocytic anemia   Disposition: Home  Discharge Condition: Stable  Discharge Exam:  General: Appears comfortable, ambulating around room, NAD Cardiovascular: Normal heart rate, regular rhythm, pulses intact, no murmurs, rubs, or gallops Respiratory: Lung fields clear throughout, normal WOB on RA Extremities: No LE edema or bruising  Brief Hospital Course:  Ronald Lowe is a 65 y.o. male who presented with dyspnea and found to have multifocal PNA and later underwent acute alcohol withdrawal.   Multifocal pneumonia  Acute COPD exacerbation  Patient was treated with IV ceftriaxone, azithromycin and given a dose of IV steroids with suspicion for COPD exacerbation.  Patient did not require supplemental oxygen.  Patient was started on steroids for total of 5 days. Patient was treated with antibiotics for several days (ceftriaxone x3 doses, azithromycin x2 doses) and was not continued while in the ICU, patient missed doses before transfer to the floor and did not feel the need to continue antibiotics as patient was significantly improved. In the ICU, patient was tested for Legionella and strep pneumo. His strep pneumo was negative and Legionella had not  resulted at the time of discharge.   Acute Alcohol withdrawal  Delirium Tremens On the morning after admission, patient's CIWA was elevated and he was transitioned from [facility] Ativan to 4mg  Ativan every 4 hours. His agitation later worsened and CIWA increased to 34. The patient also became more agitated and combative despite receiving 4 mg of Ativan. Vitals at the time were hypotensive with systolic BP in the 57Q &  tachycardia (AF with RVR). He was evaluated by CCM and subsequently transferred to ICU and started on Precedex drip.  Patient transferred back out to floor status on 6/30. Patient started on Naltrexone for decreasing alcohol craving. Patient was continued on folate and thiamine during hospitalization and upon discharge.   Afib with RVR On 6/27 patient went into A. fib with RVR, likely related to alcohol use and withdrawal. In the ICU, patient was placed on amiodarone drip. Patient was started on Eliquis 5mg  BID. On return to the floor, cardiology was consulted and recommended transition to oral 200mg  of amiodarone followed by 30 days of amiodarone once daily. Echocardiogram was completed to evaluate cardiac structure and showed EF 46-96%, Grade I diastolic dysfunction and no valvular pathology. Patient's heart rate was  controlled at the time of discharge.  AKI Patient had elevated creatinine of 1.90.  Home lisinopril was held until resolution. Blood pressures were stable during hospitalization and patient discharged on 06/18/2021.   All other chronic conditions were stable during hospitalization.   Issues for follow-up: Recommend outpt PFTs given amiodarone therapy.  Naltrexone started in the hospital, continued encouragement of cessation counseling  Recommend continuation of folic acid and thiamine supplementation. Monitor respiratory symptoms, antibiotic treatment would not have covered duration recommended for CAP. Patient voices a desire to quit  smoking. Given nicotine patches upon  discharge. Recommend further counseling regarding cessation/abstinence.  May consider increasing metoprolol tartrate from 12.5mg  to 25mg  twice daily if his BP remains high. Recommend follow-up CXR in 6 weeks to follow-up on COPD/possible multifocal PNA Lisinopril was held during hospitalization due to AKI.  Consider if appropriate to restart at outpatient follow-up. Recommend repeat LFTs at follow-up appointment We did start Trelegy on discharge    Significant Procedures: 06/26-06/30 on precedex gtt in ICU  Significant Labs and Imaging:  Recent Labs  Lab 06/16/21 0725 06/17/21 0307 06/18/21 0100  WBC 4.9 8.7 7.1  HGB 12.9* 12.6* 13.1  HCT 36.8* 36.0* 37.1*  PLT 210 212 235   Recent Labs  Lab 06/13/21 2216 06/14/21 0357 06/14/21 2103 06/15/21 0504 06/16/21 0725 06/18/21 0100  NA  --  131* 135 134* 139 137  K  --  3.7 3.5 4.3 4.1 3.6  CL  --  98 103 103 109 105  CO2  --  20* 20* 21* 21* 24  GLUCOSE  --  181* 123* 163* 135* 132*  BUN  --  16 19 19  25* 16  CREATININE  --  1.10 0.99 0.94 1.06 1.01  CALCIUM  --  8.3* 8.5* 8.6* 8.5* 8.9  MG  --  1.3* 2.6* 2.6*  --   --   ALKPHOS 35*  --  34*  --   --  43  AST 36  --  29  --   --  20  ALT 21  --  20  --   --  27  ALBUMIN 3.0*  --  3.0*  --   --  2.9*     Results/Tests Pending at Time of Discharge:  Urine Legionella   Discharge Medications:  Allergies as of 06/18/2021   No Known Allergies      Medication List     STOP taking these medications    Advair HFA 45-21 MCG/ACT inhaler Generic drug: fluticasone-salmeterol   Incruse Ellipta 62.5 MCG/INH Aepb Generic drug: umeclidinium bromide   lisinopril 5 MG tablet Commonly known as: ZESTRIL       TAKE these medications    albuterol 108 (90 Base) MCG/ACT inhaler Commonly known as: VENTOLIN HFA Inhale 2 puffs into the lungs every 6 (six) hours as needed for wheezing or shortness of breath.   amiodarone 200 MG tablet Commonly known as: PACERONE Take 1 tablet  (200 mg total) by mouth 2 (two) times daily for 6 days, THEN 1 tablet (200 mg total) daily. Start taking on: June 18, 2021   atorvastatin 40 MG tablet Commonly known as: LIPITOR Take 1 tablet (40 mg total) by mouth daily.   CertaVite/Antioxidants Tabs Take 1 tablet by mouth daily.   Eliquis 5 MG Tabs tablet Generic drug: apixaban Take 1 tablet (5 mg total) by mouth 2 (two) times daily.   folic acid 1 MG tablet Commonly known as: FOLVITE Take 1 tablet (1 mg total) by mouth daily.   metoprolol tartrate 25 MG tablet Commonly known as: LOPRESSOR Take 0.5 tablets (12.5 mg total) by mouth 2 (two) times daily.   naltrexone 50 MG tablet Commonly known as: DEPADE Take 1 tablet (50 mg total) by mouth daily.   nicotine 14 mg/24hr patch Commonly known as: NICODERM CQ - dosed in mg/24 hours Place 1 patch (14 mg total) onto the skin daily.   pantoprazole 40 MG tablet Commonly known as: PROTONIX Take 1 tablet (40 mg total) by mouth daily.   thiamine  100 MG tablet Take 1 tablet (100 mg total) by mouth daily.   Trelegy Ellipta 100-62.5-25 MCG/INH Aepb Generic drug: Fluticasone-Umeclidin-Vilant Inhale 1 Dose into the lungs daily.       ASK your doctor about these medications    amLODipine 5 MG tablet Commonly known as: NORVASC TAKE 1 TABLET (5 MG TOTAL) BY MOUTH EVERY OTHER DAY. DX: I10        Discharge Instructions: Please refer to Patient Instructions section of EMR for full details.  Patient was counseled important signs and symptoms that should prompt return to medical care, changes in medications, dietary instructions, activity restrictions, and follow up appointments.   Follow-Up Appointments:  Follow-up Information     Nicolette Bang, MD. Go in 2 day(s).   Specialty: Family Medicine Why: Hospital follow up Contact information: [address] [place] [address] [phone]                 Pearla Dubonnet 06/18/2021, 2:19 PM PGY-1, [facility]  Upper Level Addendum:  I have seen and evaluated this patient along with Dr. Joelyn Oms and reviewed the above note, making necessary revisions as appropriate.  I agree with the documentation and physical exam as noted above.  Lurline Del, DO PGY-3  [facility] Family Medicine Residency

## 2021-06-18 NOTE — Evaluation (Signed)
Occupational Therapy Evaluation Patient Details Name: Ronald Lowe MRN: [id number] DOB: 27-Jun-1956 Today's Date: 06/18/2021    History of Present Illness Pt adm 6/27 dyspnea and found to have multifocal PNA. Hospitalization complicated by ETOH withdrawal and afib with RVR. PMH - copd, etoh abuse, htn   Clinical Impression   Pt admitted with above and presents to OT at baseline level.  Pt received upright making his bed with no apparent LOB or weakness.  Pt reports MD in earlier and reports pt to d/c home today.  Pt able to don socks/shoes without issue.  Pt ready for d/c from OT standpoint when medically ready.    Follow Up Recommendations  No OT follow up    Equipment Recommendations  None recommended by OT    Recommendations for Other Services       Precautions / Restrictions Precautions Precautions: None Restrictions Weight Bearing Restrictions: No      Mobility Bed Mobility                    Transfers Overall transfer level: Independent Equipment used: None             General transfer comment: Pt standing up at EOB making his bed with no issue    Balance Overall balance assessment: No apparent balance deficits (not formally assessed) (standing EOB making the bed with no balance issues)                                         ADL either performed or assessed with clinical judgement   ADL Overall ADL's : At baseline;Independent                                             Vision Baseline Vision/History: Wears glasses Wears Glasses:  (bifocals) Patient Visual Report: No change from baseline Vision Assessment?: No apparent visual deficits     Perception     Praxis      Pertinent Vitals/Pain Pain Assessment: No/denies pain     Hand Dominance Right   Extremity/Trunk Assessment Upper Extremity Assessment Upper Extremity Assessment: Overall WFL for tasks assessed           Communication  Communication Communication: No difficulties   Cognition Arousal/Alertness: Awake/alert Behavior During Therapy: WFL for tasks assessed/performed Overall Cognitive Status: No family/caregiver present to determine baseline cognitive functioning Area of Impairment: Safety/judgement                         Safety/Judgement: Decreased awareness of safety                    Home Living Family/patient expects to be discharged to:: Private residence Living Arrangements: Spouse/significant other Available Help at Discharge: Family Type of Home: House Home Access: Stairs to enter Technical brewer of Steps: 1   Home Layout: One level     Bathroom Shower/Tub: Teacher, early years/pre: Standard     Home Equipment: None   Additional Comments: he reports he may put in grab bars in shower      Prior Functioning/Environment Level of Independence: Independent        Comments: drive        OT Problem List:  OT Treatment/Interventions:      OT Goals(Current goals can be found in the care plan section) Acute Rehab OT Goals Patient Stated Goal: to go home today OT Goal Formulation: All assessment and education complete, DC therapy  OT Frequency:       AM-PAC OT "6 Clicks" Daily Activity     Outcome Measure Help from another person eating meals?: None Help from another person taking care of personal grooming?: None Help from another person toileting, which includes using toliet, bedpan, or urinal?: None Help from another person bathing (including washing, rinsing, drying)?: None Help from another person to put on and taking off regular upper body clothing?: None Help from another person to put on and taking off regular lower body clothing?: None 6 Click Score: 24   End of Session Equipment Utilized During Treatment: Gait belt Nurse Communication: Mobility status  Activity Tolerance: Patient tolerated treatment well Patient left: in  bed  OT Visit Diagnosis: Unsteadiness on feet (R26.81);History of falling (Z91.81)                Time: 7614-7092 OT Time Calculation (min): 12 min Charges:  OT General Charges $OT Visit: 1 Visit OT Evaluation $OT Eval Low Complexity: [place], [place] 06/18/2021, 8:24 AM

## 2021-06-18 NOTE — Plan of Care (Signed)
  Problem: Safety: Goal: Non-violent Restraint(s) 06/18/2021 1411 by Shelton Silvas, RN Outcome: Adequate for Discharge 06/18/2021 1411 by Shelton Silvas, RN Outcome: Adequate for Discharge   Problem: Education: Goal: Knowledge of General Education information will improve Description: Including pain rating scale, medication(s)/side effects and non-pharmacologic comfort measures 06/18/2021 1411 by Shelton Silvas, RN Outcome: Adequate for Discharge 06/18/2021 1411 by Shelton Silvas, RN Outcome: Adequate for Discharge   Problem: Health Behavior/Discharge Planning: Goal: Ability to manage health-related needs will improve 06/18/2021 1411 by Shelton Silvas, RN Outcome: Adequate for Discharge 06/18/2021 1411 by Shelton Silvas, RN Outcome: Adequate for Discharge   Problem: Clinical Measurements: Goal: Ability to maintain clinical measurements within normal limits will improve 06/18/2021 1411 by Shelton Silvas, RN Outcome: Adequate for Discharge 06/18/2021 1411 by Shelton Silvas, RN Outcome: Adequate for Discharge Goal: Will remain free from infection 06/18/2021 1411 by Shelton Silvas, RN Outcome: Adequate for Discharge 06/18/2021 1411 by Shelton Silvas, RN Outcome: Adequate for Discharge Goal: Diagnostic test results will improve 06/18/2021 1411 by Shelton Silvas, RN Outcome: Adequate for Discharge 06/18/2021 1411 by Shelton Silvas, RN Outcome: Adequate for Discharge Goal: Respiratory complications will improve 06/18/2021 1411 by Shelton Silvas, RN Outcome: Adequate for Discharge 06/18/2021 1411 by Shelton Silvas, RN Outcome: Adequate for Discharge Goal: Cardiovascular complication will be avoided 06/18/2021 1411 by Shelton Silvas, RN Outcome: Adequate for Discharge 06/18/2021 1411 by Shelton Silvas, RN Outcome: Adequate for Discharge   Problem: Activity: Goal: Risk for activity intolerance will decrease 06/18/2021 1411 by Shelton Silvas, RN Outcome: Adequate for Discharge 06/18/2021 1411  by Shelton Silvas, RN Outcome: Adequate for Discharge   Problem: Nutrition: Goal: Adequate nutrition will be maintained 06/18/2021 1411 by Shelton Silvas, RN Outcome: Adequate for Discharge 06/18/2021 1411 by Shelton Silvas, RN Outcome: Adequate for Discharge   Problem: Coping: Goal: Level of anxiety will decrease 06/18/2021 1411 by Shelton Silvas, RN Outcome: Adequate for Discharge 06/18/2021 1411 by Shelton Silvas, RN Outcome: Adequate for Discharge   Problem: Elimination: Goal: Will not experience complications related to bowel motility 06/18/2021 1411 by Shelton Silvas, RN Outcome: Adequate for Discharge 06/18/2021 1411 by Shelton Silvas, RN Outcome: Adequate for Discharge Goal: Will not experience complications related to urinary retention 06/18/2021 1411 by Shelton Silvas, RN Outcome: Adequate for Discharge 06/18/2021 1411 by Shelton Silvas, RN Outcome: Adequate for Discharge   Problem: Pain Managment: Goal: General experience of comfort will improve 06/18/2021 1411 by Shelton Silvas, RN Outcome: Adequate for Discharge 06/18/2021 1411 by Shelton Silvas, RN Outcome: Adequate for Discharge   Problem: Safety: Goal: Ability to remain free from injury will improve 06/18/2021 1411 by Shelton Silvas, RN Outcome: Adequate for Discharge 06/18/2021 1411 by Shelton Silvas, RN Outcome: Adequate for Discharge   Problem: Skin Integrity: Goal: Risk for impaired skin integrity will decrease 06/18/2021 1411 by Shelton Silvas, RN Outcome: Adequate for Discharge 06/18/2021 1411 by Shelton Silvas, RN Outcome: Adequate for Discharge

## 2021-06-18 NOTE — Progress Notes (Signed)
Patient IV removal well tolerated, discharge instructions explained to patient and his wife.  Patient stated his has all of his belongings. To be transported by wife in family car.

## 2021-06-18 NOTE — Plan of Care (Signed)

## 2021-06-18 NOTE — Progress Notes (Signed)
Family Medicine Teaching Service Daily Progress Note Intern Pager: [phone]  Patient name: Ronald Lowe Medical record number: [id number] Date of birth: [birth year] Age: 65 y.o. Gender: male  Primary Care Provider: Nicolette Bang, MD Consultants: Cards, PCCM (signed off) Code Status: Full  Pt Overview and Major Events to Date:  6/26- Admitted 6/26- Transferred to ICU for Precedex gtt 6/30- Transferred back to our service  Assessment and Plan:  Ronald Lowe is a 65yo male who presented with dyspnea, found to have multifocal PNA who quickly underwent acute etOH withdrawal requiring precedex drip in the ICU.  Alcohol withdrawal  Delirium Tremens  Polysubstance abuse CMP this am with normal liver function. CIWAs remain 0. Patient received first dose of naltrexone yesterday. Continues to voice desire for abstinence, no active cravings.  - Continue thiamine and folate - Continue PRN atarax for anxiety - Nicotine patch    A. fib with RVR Rate controlled at 85 this am. Echo still pending. TSH is normal. As noted by cards yesterday, it seems likely that this was precipitated in the setting of pneumonia/withdrawal.  - Continue PO amiodarone per cards.   - Day 2/7 of amiodarione 200mg  BID then will switch to 200mg  daily for a total of 30 days - Metoprolol tartrate 12.5 mg BID per cards - Echo today, stable for discharge if no significant findings    COPD exacerbation  multifocal pneumonia Continues  to breathe comfortably on room air. Remains afebrile. Completed course of prednisone yesterday. As noted previously, he received CTX x3 and Azithromycin x2, which would not be considered adequate treatment, but as he appears well and is improved from initial presentation it does not seem he needs further antibiotic treatment at this time.  - Continue Yupelri daily - Arformoterol nebs BID - Pulmicort nebs BID  - Urine Legionella pending.    Difficulty with sleep Tried melatonin  last night with no improvement. Expect this to resolve when he returns home.    HTN BP remains mildly elevated to mid 140s/90s. Lisinopril was held on admission in the setting of AKI. Cardiology favors keeping him off of this for the time being.  - Continue with home amlodipine - Metoprolol as above.   FEN/GI: Soft PPx: Eliquis Dispo:Home today. Barriers include pending Echo.   Subjective:  Ronald Lowe says he's "ready to go." Says he "feels great." No complaints today. No chest pain, shortness of breath. No alcohol or nicotine cravings.   Objective: Temp:  [98.2 F (36.8 C)] 98.2 F (36.8 C) (07/01 0457) Pulse Rate:  [84-94] 92 (07/01 0904) Resp:  [17-18] 17 (07/01 0457) BP: (125-157)/(92-99) 125/99 (07/01 0904) SpO2:  [97 %-98 %] 97 % (07/01 0757) FiO2 (%):  [21 %] 21 % (06/30 1940) Physical Exam: General: Appears comfortable, ambulating around room, NAD Cardiovascular: Normal heart rate, regular rhythm, no murmurs, rubs, or gallops Respiratory: Lung fields clear throughout, normal WOB on RA Extremities: No LE edema or bruising  Laboratory: Recent Labs  Lab 06/16/21 0725 06/17/21 0307 06/18/21 0100  WBC 4.9 8.7 7.1  HGB 12.9* 12.6* 13.1  HCT 36.8* 36.0* 37.1*  PLT 210 212 235   Recent Labs  Lab 06/13/21 2216 06/14/21 0357 06/14/21 2103 06/15/21 0504 06/16/21 0725 06/18/21 0100  NA  --    < > 135 134* 139 137  K  --    < > 3.5 4.3 4.1 3.6  CL  --    < > 103 103 109 105  CO2  --    < >  20* 21* 21* 24  BUN  --    < > 19 19 25* 16  CREATININE  --    < > 0.99 0.94 1.06 1.01  CALCIUM  --    < > 8.5* 8.6* 8.5* 8.9  PROT 6.1*  --  6.0*  --   --  5.8*  BILITOT 1.1  --  0.9  --   --  0.9  ALKPHOS 35*  --  34*  --   --  43  ALT 21  --  20  --   --  27  AST 36  --  29  --   --  20  GLUCOSE  --    < > 123* 163* 135* 132*   < > = values in this interval not displayed.    Imaging/Diagnostic Tests: Echo pending today  Ronald Gibson, MD 06/18/2021, 9:19 AM PGY-1,  [place] Intern pager: [phone], text pages welcome

## 2021-06-18 NOTE — Care Management Important Message (Signed)
Important Message  Patient Details  Name: Ronald Lowe MRN: [id number] Date of Birth: 02-21-1956   Medicare Important Message Given:  Yes - Important Message mailed due to current National Emergency  Verbal consent obtained due to current National Emergency  Relationship to patient: Self Contact Name: Bettie Call Date: 06/18/21  Time: 1031 Phone: [phone] Outcome: Spoke with contact Important Message mailed to: Patient address on file   [name] 06/18/2021, 10:33 AM

## 2021-06-19 LAB — LEGIONELLA PNEUMOPHILA SEROGP 1 UR AG: L. pneumophila Serogp 1 Ur Ag: NEGATIVE

## 2021-06-22 ENCOUNTER — Telehealth: Payer: Self-pay

## 2021-06-22 NOTE — Telephone Encounter (Signed)
Transition Care Management Follow-up Telephone Call  Call completed with patient's wife, [name]. DPR on file to speak with her. She said he was not home at the time of this call.  Date of discharge and from where: 06/18/2021, [facility]  How have you been since you were released from the hospital? She said he is doing a lot better Any questions or concerns? No  Items Reviewed: Did the pt receive and understand the discharge instructions provided? Yes  Medications obtained and verified? Yes  Other? No  Any new allergies since your discharge? No  Dietary orders reviewed? No Do you have support at home? Yes   Home Care and Equipment/Supplies: Were home health services ordered? no If so, what is the name of the agency? N/a  Has the agency set up a time to come to the patient's home? not applicable Were any new equipment or medical supplies ordered?  No What is the name of the medical supply agency? N/a Were you able to get the supplies/equipment? not applicable Do you have any questions related to the use of the equipment or supplies? No  Functional Questionnaire: (I = Independent and D = Dependent) ADLs: independent   Follow up appointments reviewed:  PCP Hospital f/u appt confirmed?  His wife said he will need to call back to schedule an appointment.   [facility] f/u appt confirmed?  None scheduled at this time   Are transportation arrangements needed?  His wife stated that he would need to discuss with this CM.  She said she would have him call this CM back when he returns home.  If their condition worsens, is the pt aware to call PCP or go to the Emergency Dept.? Yes Was the patient provided with contact information for the PCP's office or ED? Yes Was to pt encouraged to call back with questions or concerns? Yes

## 2021-06-29 ENCOUNTER — Other Ambulatory Visit (HOSPITAL_COMMUNITY): Payer: Self-pay

## 2021-06-29 ENCOUNTER — Other Ambulatory Visit (HOSPITAL_BASED_OUTPATIENT_CLINIC_OR_DEPARTMENT_OTHER): Payer: Self-pay

## 2021-06-29 ENCOUNTER — Encounter (HOSPITAL_BASED_OUTPATIENT_CLINIC_OR_DEPARTMENT_OTHER): Payer: Self-pay | Admitting: Pharmacist

## 2021-06-29 ENCOUNTER — Telehealth (HOSPITAL_BASED_OUTPATIENT_CLINIC_OR_DEPARTMENT_OTHER): Payer: Self-pay | Admitting: Pharmacist

## 2021-06-29 NOTE — Telephone Encounter (Signed)
Spoke with the patient and his wife.  Counseled Ronald Lowe about medication compliance.  Created an I-vent with detailed notes including PCP follow up appointment.

## 2021-07-15 DIAGNOSIS — C641 Malignant neoplasm of right kidney, except renal pelvis: Secondary | ICD-10-CM | POA: Diagnosis not present

## 2021-07-15 DIAGNOSIS — Z125 Encounter for screening for malignant neoplasm of prostate: Secondary | ICD-10-CM | POA: Diagnosis not present

## 2021-07-15 DIAGNOSIS — R590 Localized enlarged lymph nodes: Secondary | ICD-10-CM | POA: Diagnosis not present

## 2021-07-21 ENCOUNTER — Other Ambulatory Visit: Payer: Self-pay

## 2021-07-21 ENCOUNTER — Ambulatory Visit
Admission: EM | Admit: 2021-07-21 | Discharge: 2021-07-21 | Disposition: A | Discharge status: Home / Self Care | Payer: Medicare Other | Attending: Family Medicine | Admitting: Family Medicine

## 2021-07-21 DIAGNOSIS — R21 Rash and other nonspecific skin eruption: Secondary | ICD-10-CM | POA: Diagnosis not present

## 2021-07-21 MED ORDER — TRIAMCINOLONE ACETONIDE 0.1 % EX CREA: 1.0000 "application " | TOPICAL_CREAM | Freq: Two times a day (BID) | CUTANEOUS | 0 refills | Status: AC | PRN

## 2021-07-21 MED ORDER — PREDNISONE 10 MG PO TABS: ORAL_TABLET | ORAL | 0 refills | Status: DC

## 2021-07-21 NOTE — ED Provider Notes (Signed)
[name]    CSN: [id number] Arrival date & time: 07/21/21  1403      History   Chief Complaint Chief Complaint  Patient presents with   Rash    HPI Ronald Lowe is a 65 y.o. male.   Patient presenting today with an itchy rash to bilateral arms diffusely.  Started on the legs but has overall improved in this area.  Denies any new exposures that he is aware of, new medication changes, new foods.  Has not been trying anything over-the-counter for symptoms.  No fevers, chills, body aches, joint pains, chest tightness, difficulty breathing.   Past Medical History:  Diagnosis Date   Chronic kidney disease    Hyperlipidemia    Hypertension    Tobacco abuse     Patient Active Problem List   Diagnosis Date Noted   Alcohol withdrawal delirium ([name]) 06/17/2021   Atrial fibrillation with rapid ventricular response ([place]) 06/17/2021   Macrocytic anemia 06/17/2021   COPD exacerbation ([place]) 06/14/2021   Multifocal pneumonia    CAP (community acquired pneumonia) 06/13/2021   Emphysema lung ([place]) 03/17/2021   Positive hepatitis C antibody test 10/02/2019   Tobacco dependence 09/24/2019   Alcohol use disorder, mild, abuse 09/24/2019   Orthostatic hypotension 06/12/2014   EKG abnormality 06/12/2014   HTN (hypertension) 06/12/2014    Past Surgical History:  Procedure Laterality Date   TONSILLECTOMY         Home Medications    Prior to Admission medications   Medication Sig Start Date End Date Taking? Authorizing Provider  predniSONE (DELTASONE) 10 MG tablet Take 6 tabs daily with breakfast x 2 days, 5 tabs daily x 2 days, 4 tabs daily x 2 days, etc 07/21/21  Yes Volney American, PA-C  triamcinolone cream (KENALOG) 0.1 % Apply 1 application topically 2 (two) times daily as needed for up to 10 days. 07/21/21 07/31/21 Yes Volney American, PA-C  albuterol (VENTOLIN HFA) 108 (90 Base) MCG/ACT inhaler Inhale 2 puffs into the lungs every 6 (six) hours as  needed for wheezing or shortness of breath. 05/05/21   Nicolette Bang, MD  amiodarone (PACERONE) 200 MG tablet Take 1 tablet (200 mg total) by mouth 2 (two) times daily for 6 days, THEN 1 tablet (200 mg total) daily. 06/18/21 07/24/21  Lurline Del, DO  amLODipine (NORVASC) 5 MG tablet TAKE 1 TABLET (5 MG TOTAL) BY MOUTH EVERY OTHER DAY. DX: I10 Patient taking differently: Take 5 mg by mouth every other day. 05/20/21   Nicolette Bang, MD  apixaban (ELIQUIS) 5 MG TABS tablet Take 1 tablet (5 mg total) by mouth 2 (two) times daily. 06/18/21   Lurline Del, DO  atorvastatin (LIPITOR) 40 MG tablet Take 1 tablet (40 mg total) by mouth daily. 03/25/21   Nicolette Bang, MD  Fluticasone-Umeclidin-Vilant (TRELEGY ELLIPTA) 100-62.5-25 MCG/INH AEPB Inhale 1 Dose into the lungs daily. 06/18/21   Lurline Del, DO  folic acid (FOLVITE) 1 MG tablet Take 1 tablet (1 mg total) by mouth daily. 06/18/21   Lurline Del, DO  metoprolol tartrate (LOPRESSOR) 25 MG tablet Take 0.5 tablets (12.5 mg total) by mouth 2 (two) times daily. 06/18/21   Lurline Del, DO  Multiple Vitamins-Minerals (CERTAVITE/ANTIOXIDANTS) TABS Take 1 tablet by mouth daily. 06/18/21   Lurline Del, DO  naltrexone (DEPADE) 50 MG tablet Take 1 tablet (50 mg total) by mouth daily. 06/18/21   Lurline Del, DO  pantoprazole (PROTONIX) 40 MG tablet Take 1 tablet (40  mg total) by mouth daily. 05/26/21   Nicolette Bang, MD  thiamine 100 MG tablet Take 1 tablet (100 mg total) by mouth daily. 06/18/21   Lurline Del, DO    Family History Family History  Problem Relation Age of Onset   Emphysema Father    CAD Father        CABG   Stroke Mother    CAD Brother 58       CABG   Hypertension Brother     Social History Social History   Tobacco Use   Smoking status: Every Day    Packs/day: 1.00    Years: 50.00    Pack years: 50.00    Types: Cigarettes    Start date: 12/19/1970   Smokeless tobacco: Never  Vaping Use    Vaping Use: Never used  Substance Use Topics   Alcohol use: Yes    Alcohol/week: 42.0 standard drinks    Types: 28 Cans of beer, 14 Shots of liquor per week    Comment: did not drink yesterday, was shaky today and wife gave him one shot pta   Drug use: No     Allergies   Patient has no known allergies.   Review of Systems Review of Systems PER HPI    Physical Exam Triage Vital Signs ED Triage Vitals [07/21/21 1646]  Enc Vitals Group     BP (!) 168/98     Pulse Rate 71     Resp 18     Temp 98.7 F (37.1 C)     Temp Source Oral     SpO2 97 %     Weight      Height      Head Circumference      Peak Flow      Pain Score 0     Pain Loc      Pain Edu?      Excl. in [place]?    No data found.  Updated Vital Signs BP (!) 168/98 (BP Location: Left Arm)   Pulse 71   Temp 98.7 F (37.1 C) (Oral)   Resp 18   SpO2 97%   Visual Acuity Right Eye Distance:   Left Eye Distance:   Bilateral Distance:    Right Eye Near:   Left Eye Near:    Bilateral Near:     Physical Exam Vitals and nursing note reviewed.  Constitutional:      Appearance: Normal appearance.  HENT:     Head: Atraumatic.  Eyes:     Extraocular Movements: Extraocular movements intact.     Conjunctiva/sclera: Conjunctivae normal.  Cardiovascular:     Rate and Rhythm: Normal rate and regular rhythm.  Pulmonary:     Effort: Pulmonary effort is normal.     Breath sounds: Normal breath sounds.  Musculoskeletal:        General: Normal range of motion.     Cervical back: Normal range of motion and neck supple.  Skin:    General: Skin is warm and dry.     Findings: Rash present.     Comments: Erythematous maculopapular rash mid forearm up to upper arm bilaterally.  Neurological:     General: No focal deficit present.     Mental Status: He is oriented to person, place, and time.  Psychiatric:        Mood and Affect: Mood normal.        Thought Content: Thought content normal.  Judgment:  Judgment normal.     UC Treatments / Results  Labs (all labs ordered are listed, but only abnormal results are displayed) Labs Reviewed - No data to display  EKG   Radiology No results found.  Procedures Procedures (including critical care time)  Medications Ordered in UC Medications - No data to display  Initial Impression / Assessment and Plan / UC Course  I have reviewed the triage vital signs and the nursing notes.  Pertinent labs & imaging results that were available during my care of the patient were reviewed by me and considered in my medical decision making (see chart for details).     Possibly poison ivy dermatitis or irritant dermatitis.  We will treat with prednisone extended taper and triamcinolone cream.  Follow-up if worsening or not resolving. Final Clinical Impressions(s) / UC Diagnoses   Final diagnoses:  Rash   Discharge Instructions   None    ED Prescriptions     Medication Sig Dispense Auth. Provider   predniSONE (DELTASONE) 10 MG tablet Take 6 tabs daily with breakfast x 2 days, 5 tabs daily x 2 days, 4 tabs daily x 2 days, etc 21 tablet Volney American, PA-C   triamcinolone cream (KENALOG) 0.1 % Apply 1 application topically 2 (two) times daily as needed for up to 10 days. 80 g Volney American, [place]      PDMP not reviewed this encounter.   Volney American, [place] 07/21/21 1713

## 2021-07-21 NOTE — ED Triage Notes (Signed)
Pt c/o itchy rash to arms and legs for 2wks.

## 2021-09-11 ENCOUNTER — Ambulatory Visit (INDEPENDENT_AMBULATORY_CARE_PROVIDER_SITE_OTHER): Payer: Medicare HMO

## 2021-09-11 DIAGNOSIS — Z0000 Encounter for general adult medical examination without abnormal findings: Secondary | ICD-10-CM

## 2021-09-11 NOTE — Progress Notes (Deleted)
Subjective:   ANIKETH HUBERTY is a 65 y.o. male who presents for an Initial Medicare Annual Wellness Visit.  I connected with  [name] on 09/11/21 by a video enabled telemedicine application and verified that I am speaking with the correct person using two identifiers.   I discussed the limitations of evaluation and management by telemedicine. The patient expressed understanding and agreed to proceed.  Location of patient: home  Location of provider: office Persons participating in visit: Jaysten (patient) and Lanier Prude, CMA/RT(R)(BD)  Review of Systems    Defer to PCP       Objective:    There were no vitals filed for this visit. There is no height or weight on file to calculate BMI.  Advanced Directives 06/16/2021 06/13/2021 04/07/2021 10/15/2020 10/09/2020 06/12/2014  Does Patient Have a Medical Advance Directive? - No No No No Patient does not have advance directive  Would patient like information on creating a medical advance directive? No - Patient declined - No - Patient declined No - Patient declined No - Patient declined -  Pre-existing out of facility DNR order (yellow form or pink MOST form) - - - - - No    Current Medications (verified) Outpatient Encounter Medications as of 09/11/2021  Medication Sig   albuterol (VENTOLIN HFA) 108 (90 Base) MCG/ACT inhaler Inhale 2 puffs into the lungs every 6 (six) hours as needed for wheezing or shortness of breath.   amiodarone (PACERONE) 200 MG tablet Take 1 tablet (200 mg total) by mouth 2 (two) times daily for 6 days, THEN 1 tablet (200 mg total) daily.   amLODipine (NORVASC) 5 MG tablet TAKE 1 TABLET (5 MG TOTAL) BY MOUTH EVERY OTHER DAY. DX: I10 (Patient taking differently: Take 5 mg by mouth every other day.)   apixaban (ELIQUIS) 5 MG TABS tablet Take 1 tablet (5 mg total) by mouth 2 (two) times daily.   atorvastatin (LIPITOR) 40 MG tablet Take 1 tablet (40 mg total) by mouth daily.   Fluticasone-Umeclidin-Vilant  (TRELEGY ELLIPTA) 100-62.5-25 MCG/INH AEPB Inhale 1 Dose into the lungs daily.   folic acid (FOLVITE) 1 MG tablet Take 1 tablet (1 mg total) by mouth daily.   metoprolol tartrate (LOPRESSOR) 25 MG tablet Take 0.5 tablets (12.5 mg total) by mouth 2 (two) times daily.   Multiple Vitamins-Minerals (CERTAVITE/ANTIOXIDANTS) TABS Take 1 tablet by mouth daily.   naltrexone (DEPADE) 50 MG tablet Take 1 tablet (50 mg total) by mouth daily.   pantoprazole (PROTONIX) 40 MG tablet Take 1 tablet (40 mg total) by mouth daily.   predniSONE (DELTASONE) 10 MG tablet Take 6 tabs daily with breakfast x 2 days, 5 tabs daily x 2 days, 4 tabs daily x 2 days, etc   thiamine 100 MG tablet Take 1 tablet (100 mg total) by mouth daily.   No facility-administered encounter medications on file as of 09/11/2021.    Allergies (verified) Patient has no known allergies.   History: Past Medical History:  Diagnosis Date   Chronic kidney disease    Hyperlipidemia    Hypertension    Tobacco abuse    Past Surgical History:  Procedure Laterality Date   TONSILLECTOMY     Family History  Problem Relation Age of Onset   Emphysema Father    CAD Father        CABG   Stroke Mother    CAD Brother 77       CABG   Hypertension Brother    Social  History   Socioeconomic History   Marital status: Married    Spouse name: Not on file   Number of children: 2   Years of education: Not on file   Highest education level: Not on file  Occupational History   Occupation: retired  Tobacco Use   Smoking status: Every Day    Packs/day: 1.00    Years: 50.00    Pack years: 50.00    Types: Cigarettes    Start date: 12/19/1970   Smokeless tobacco: Never  Vaping Use   Vaping Use: Never used  Substance and Sexual Activity   Alcohol use: Yes    Alcohol/week: 42.0 standard drinks    Types: 28 Cans of beer, 14 Shots of liquor per week    Comment: did not drink yesterday, was shaky today and wife gave him one shot pta   Drug use:  No   Sexual activity: Yes    Partners: Female    Birth control/protection: None  Other Topics Concern   Not on file  Social History Narrative   Lives with girlfriend.     Social Determinants of Health   Financial Resource Strain: Not on file  Food Insecurity: Not on file  Transportation Needs: Not on file  Physical Activity: Not on file  Stress: Not on file  Social Connections: Not on file    Tobacco Counseling Ready to quit: Not Answered Counseling given: Not Answered   Clinical Intake:                 Diabetic? no         Activities of Daily Living In your present state of health, do you have any difficulty performing the following activities: 06/16/2021 10/09/2020  Hearing? N N  Vision? N N  Difficulty concentrating or making decisions? N N  Walking or climbing stairs? N N  Dressing or bathing? Y Y  Doing errands, shopping? N -  Some recent data might be hidden    Patient Care Team: Nicolette Bang, MD as PCP - General (Family Medicine)  Indicate any recent Medical Services you may have received from other than [facility] providers in the past year (date may be approximate).     Assessment:   This is a routine wellness examination for Ronald Lowe.  Hearing/Vision screen No results found.  Dietary issues and exercise activities discussed:     Goals Addressed   None    Depression Screen PHQ 2/9 Scores 05/05/2021 03/08/2021 11/24/2020 09/24/2019  PHQ - 2 Score 0 0 0 0  PHQ- 9 Score 0 - - -    Fall Risk Fall Risk  05/05/2021 03/08/2021 11/24/2020 09/24/2019  Falls in the past year? 0 0 0 0  Number falls in past yr: 0 - 0 0  Injury with Fall? 0 - 0 0  Risk for fall due to : No Fall Risks - - -  Follow up Falls evaluation completed - - -    FALL RISK PREVENTION PERTAINING TO THE HOME:  Any stairs in or around the home? {YES/NO:21197} If so, are there any without handrails? {YES/NO:21197} Home free of loose throw rugs in walkways, pet beds,  electrical cords, etc? {YES/NO:21197} Adequate lighting in your home to reduce risk of falls? {YES/NO:21197}  ASSISTIVE DEVICES UTILIZED TO PREVENT FALLS:  Life alert? {YES/NO:21197} Use of a cane, walker or w/c? {YES/NO:21197} Grab bars in the bathroom? {YES/NO:21197} Shower chair or bench in shower? {YES/NO:21197} Elevated toilet seat or a handicapped toilet? {YES/NO:21197}  TIMED  UP AND GO:  Was the test performed?  N/A .  Length of time to ambulate 10 feet: N/A sec.   These test cannon be preformed in a telehealth encounter.   Cognitive Function:        Immunizations Immunization History  Administered Date(s) Administered   PFIZER(Purple Top)SARS-COV-2 Vaccination 03/20/2020, 04/10/2020   Pneumococcal Conjugate-13 09/24/2019   Tdap 09/24/2019    TDAP status: Up to date  Flu Vaccine status: Due, Education has been provided regarding the importance of this vaccine. Advised may receive this vaccine at local pharmacy or Health Dept. Aware to provide a copy of the vaccination record if obtained from local pharmacy or Health Dept. Verbalized acceptance and understanding.  {Pneumococcal vaccine status:2101807}  {Covid-19 vaccine status:2101808}  Qualifies for Shingles Vaccine? {YES/NO:21197}  Zostavax completed {YES/NO:21197}  {Shingrix Completed?:2101804}  Screening Tests Health Maintenance  Topic Date Due   Zoster Vaccines- Shingrix (1 of 2) Never done   URINE MICROALBUMIN  10/21/2011   COVID-19 Vaccine (3 - Pfizer risk series) 05/08/2020   INFLUENZA VACCINE  Never done   Fecal DNA (Cologuard)  10/13/2022   TETANUS/TDAP  09/23/2029   Hepatitis C Screening  Completed   HPV VACCINES  Aged Out    Health Maintenance  Health Maintenance Due  Topic Date Due   Zoster Vaccines- Shingrix (1 of 2) Never done   URINE MICROALBUMIN  10/21/2011   COVID-19 Vaccine (3 - Pfizer risk series) 05/08/2020   INFLUENZA VACCINE  Never done    Colorectal cancer screening: Type  of screening: Cologuard. Completed 10/14/2019. Repeat every 3 years  Lung Cancer Screening: (Low Dose CT Chest recommended if Age 35-80 years, 30 pack-year currently smoking OR have quit w/in 15years.) does qualify.   Lung Cancer Screening Referral: order was placed 03/16/2020  Additional Screening:  Hepatitis C Screening: does qualify; Completed 09/24/2019  Vision Screening: Recommended annual ophthalmology exams for early detection of glaucoma and other disorders of the eye. Is the patient up to date with their annual eye exam?  {YES/NO:21197} Who is the provider or what is the name of the office in which the patient attends annual eye exams? *** If pt is not established with a provider, would they like to be referred to a provider to establish care? {YES/NO:21197}.   Dental Screening: Recommended annual dental exams for proper oral hygiene  Community Resource Referral / Chronic Care Management: CRR required this visit?  {YES/NO:21197}  CCM required this visit?  {YES/NO:21197}     Plan:     I have personally reviewed and noted the following in the patient's chart:   Medical and social history Use of alcohol, tobacco or illicit drugs  Current medications and supplements including opioid prescriptions. Patient is not currently taking opioid prescriptions. Functional ability and status Nutritional status Physical activity Advanced directives List of other physicians Hospitalizations, surgeries, and ER visits in previous 12 months Vitals Screenings to include cognitive, depression, and falls Referrals and appointments  In addition, I have reviewed and discussed with patient certain preventive protocols, quality metrics, and best practice recommendations. A written personalized care plan for preventive services as well as general preventive health recommendations were provided to patient.     Jerilee Field, RT   09/11/2021   Nurse Notes: 30 minute non face to face visit.  AVS will be mailed to the patient.

## 2021-09-12 ENCOUNTER — Ambulatory Visit (INDEPENDENT_AMBULATORY_CARE_PROVIDER_SITE_OTHER): Payer: Medicare HMO

## 2021-09-12 DIAGNOSIS — Z0000 Encounter for general adult medical examination without abnormal findings: Secondary | ICD-10-CM

## 2021-09-12 NOTE — Progress Notes (Signed)
Subjective:   Ronald Lowe is a 65 y.o. male who presents for an Initial Medicare Annual Wellness Visit.  I connected with  Ronald Lowe on 09/12/21 by a video enabled telemedicine application and verified that I am speaking with the correct person using two identifiers.   I discussed the limitations of evaluation and management by telemedicine. The patient expressed understanding and agreed to proceed.  Location of patient: home  Location of provider: office Persons participating in visit: Ronald Lowe (patient) and Lanier Prude, CMA/RT(R)(BD) Review of Systems    Defer to PCP       Objective:    There were no vitals filed for this visit. There is no height or weight on file to calculate BMI.  Advanced Directives 09/12/2021 06/16/2021 06/13/2021 04/07/2021 10/15/2020 10/09/2020 06/12/2014  Does Patient Have a Medical Advance Directive? No - No No No No Patient does not have advance directive  Would patient like information on creating a medical advance directive? No - Patient declined No - Patient declined - No - Patient declined No - Patient declined No - Patient declined -  Pre-existing out of facility DNR order (yellow form or pink MOST form) - - - - - - No    Current Medications (verified) Outpatient Encounter Medications as of 09/12/2021  Medication Sig   ADVAIR HFA 45-21 MCG/ACT inhaler Inhale 2 puffs into the lungs 2 (two) times daily.   albuterol (VENTOLIN HFA) 108 (90 Base) MCG/ACT inhaler Inhale 2 puffs into the lungs every 6 (six) hours as needed for wheezing or shortness of breath.   amLODipine (NORVASC) 5 MG tablet TAKE 1 TABLET (5 MG TOTAL) BY MOUTH EVERY OTHER DAY. DX: I10 (Patient taking differently: Take 5 mg by mouth every other day.)   apixaban (ELIQUIS) 5 MG TABS tablet Take 1 tablet (5 mg total) by mouth 2 (two) times daily.   atorvastatin (LIPITOR) 40 MG tablet Take 1 tablet (40 mg total) by mouth daily.   Fluticasone-Umeclidin-Vilant (TRELEGY ELLIPTA)  100-62.5-25 MCG/INH AEPB Inhale 1 Dose into the lungs daily.   folic acid (FOLVITE) 1 MG tablet Take 1 tablet (1 mg total) by mouth daily.   metoprolol tartrate (LOPRESSOR) 25 MG tablet Take 0.5 tablets (12.5 mg total) by mouth 2 (two) times daily.   Multiple Vitamins-Minerals (CERTAVITE/ANTIOXIDANTS) TABS Take 1 tablet by mouth daily.   naltrexone (DEPADE) 50 MG tablet Take 1 tablet (50 mg total) by mouth daily.   pantoprazole (PROTONIX) 40 MG tablet Take 1 tablet (40 mg total) by mouth daily.   thiamine 100 MG tablet Take 1 tablet (100 mg total) by mouth daily.   [DISCONTINUED] amiodarone (PACERONE) 200 MG tablet Take 1 tablet (200 mg total) by mouth 2 (two) times daily for 6 days, THEN 1 tablet (200 mg total) daily.   [DISCONTINUED] predniSONE (DELTASONE) 10 MG tablet Take 6 tabs daily with breakfast x 2 days, 5 tabs daily x 2 days, 4 tabs daily x 2 days, etc   No facility-administered encounter medications on file as of 09/12/2021.    Allergies (verified) Patient has no known allergies.   History: Past Medical History:  Diagnosis Date   Chronic kidney disease    Hyperlipidemia    Hypertension    Tobacco abuse    Past Surgical History:  Procedure Laterality Date   TONSILLECTOMY     Family History  Problem Relation Age of Onset   Emphysema Father    CAD Father        CABG  Stroke Mother    CAD Brother 24       CABG   Hypertension Brother    Social History   Socioeconomic History   Marital status: Married    Spouse name: Not on file   Number of children: 2   Years of education: Not on file   Highest education level: Not on file  Occupational History   Occupation: retired  Tobacco Use   Smoking status: Every Day    Packs/day: 1.00    Years: 50.00    Pack years: 50.00    Types: Cigarettes    Start date: 12/19/1970   Smokeless tobacco: Never  Vaping Use   Vaping Use: Never used  Substance and Sexual Activity   Alcohol use: Yes    Alcohol/week: 42.0 standard  drinks    Types: 28 Cans of beer, 14 Shots of liquor per week    Comment: did not drink yesterday, was shaky today and wife gave him one shot pta   Drug use: No   Sexual activity: Yes    Partners: Female    Birth control/protection: None  Other Topics Concern   Not on file  Social History Narrative   Lives with girlfriend.     Social Determinants of Health   Financial Resource Strain: Low Risk    Difficulty of Paying Living Expenses: Not hard at all  Food Insecurity: No Food Insecurity   Worried About Charity fundraiser in the Last Year: Never true   [place] in the Last Year: Never true  Transportation Needs: No Transportation Needs   Lack of Transportation (Medical): No   Lack of Transportation (Non-Medical): No  Physical Activity: Insufficiently Active   Days of Exercise per Week: 3 days   Minutes of Exercise per Session: 20 min  Stress: No Stress Concern Present   Feeling of Stress : Not at all  Social Connections: Moderately Isolated   Frequency of Communication with Friends and Family: Twice a week   Frequency of Social Gatherings with Friends and Family: More than three times a week   Attends Religious Services: Never   Marine scientist or Organizations: Yes   Attends Archivist Meetings: Never   Marital Status: Separated    Tobacco Counseling: Ready to quit - no but patient states that he has decreased the amount that he smokes some. Education on quitting smoking provided in AVS.     Clinical Intake:  Pre-visit preparation completed: Yes  Pain : No/denies pain     Diabetes: No  How often do you need to have someone help you when you read instructions, pamphlets, or other written materials from your doctor or pharmacy?: 1 - Never  Diabetic? no  Interpreter Needed?: No      Activities of Daily Living In your present state of health, do you have any difficulty performing the following activities: 09/12/2021 06/16/2021  Hearing?  N N  Vision? N N  Difficulty concentrating or making decisions? N N  Walking or climbing stairs? N N  Dressing or bathing? N Y  Doing errands, shopping? N N  Some recent data might be hidden    Patient Care Team: Nicolette Bang, MD as PCP - General (Family Medicine)  Indicate any recent Medical Services you may have received from other than [facility] providers in the past year (date may be approximate).     Assessment:   This is a routine wellness examination for Ronald Lowe.  Hearing/Vision screen  No results found.  Dietary issues and exercise activities discussed: Current Exercise Habits: Home exercise routine, Type of exercise: strength training/weights, Time (Minutes): 15, Frequency (Times/Week): 4, Weekly Exercise (Minutes/Week): 60, Intensity: Other (Comment) (patietn has dumbells that he uses mutli times during the week)   Goals Addressed   None    Depression Screen PHQ 2/9 Scores 09/12/2021 05/05/2021 03/08/2021 11/24/2020 09/24/2019  PHQ - 2 Score 0 0 0 0 0  PHQ- 9 Score - 0 - - -    Fall Risk Fall Risk  09/12/2021 05/05/2021 03/08/2021 11/24/2020 09/24/2019  Falls in the past year? 0 0 0 0 0  Number falls in past yr: 0 0 - 0 0  Injury with Fall? 0 0 - 0 0  Risk for fall due to : No Fall Risks No Fall Risks - - -  Follow up Falls evaluation completed Falls evaluation completed - - -    FALL RISK PREVENTION PERTAINING TO THE HOME:  Any stairs in or around the home? No  If so, are there any without handrails? No  Home free of loose throw rugs in walkways, pet beds, electrical cords, etc? Yes  Adequate lighting in your home to reduce risk of falls? Yes   ASSISTIVE DEVICES UTILIZED TO PREVENT FALLS:  Life alert? No  Use of a cane, walker or w/c? No  Grab bars in the bathroom? No  Shower chair or bench in shower? No  Elevated toilet seat or a handicapped toilet? No   TIMED UP AND GO:  Was the test performed?  N/A .  Length of time to ambulate 10 feet: N/A sec.      Cognitive Function:     6CIT Screen 09/12/2021  What Year? 0 points  What month? 0 points  What time? 0 points  Count back from 20 0 points  Months in reverse 0 points  Repeat phrase 0 points  Total Score 0    Immunizations Immunization History  Administered Date(s) Administered   PFIZER(Purple Top)SARS-COV-2 Vaccination 03/20/2020, 04/10/2020   Pneumococcal Conjugate-13 09/24/2019   Tdap 09/24/2019    TDAP status: Up to date  Flu Vaccine status: Due, Education has been provided regarding the importance of this vaccine. Advised may receive this vaccine at local pharmacy or Health Dept. Aware to provide a copy of the vaccination record if obtained from local pharmacy or Health Dept. Verbalized acceptance and understanding.  Pneumococcal vaccine status: Due, Education has been provided regarding the importance of this vaccine. Advised may receive this vaccine at local pharmacy or Health Dept. Aware to provide a copy of the vaccination record if obtained from local pharmacy or Health Dept. Verbalized acceptance and understanding.  Covid-19 vaccine status: Completed vaccines - per patient had first 2 injections at a local [facility]  Qualifies for Shingles Vaccine? Yes   Zostavax completed No   Shingrix Completed?: Yes - per patient had both done at local [facility]  Topic Date Due   Zoster Vaccines- Shingrix (1 of 2) Never done   URINE MICROALBUMIN  10/21/2011   COVID-19 Vaccine (3 - Pfizer risk series) 05/08/2020   INFLUENZA VACCINE  Never done   Fecal DNA (Cologuard)  10/13/2022   TETANUS/TDAP  09/23/2029   Hepatitis C Screening  Completed   HPV VACCINES  Aged Out    Health Maintenance  Health Maintenance Due  Topic Date Due   Zoster Vaccines- Shingrix (1 of 2) Never done   URINE MICROALBUMIN  10/21/2011  COVID-19 Vaccine (3 - Pfizer risk series) 05/08/2020   INFLUENZA VACCINE  Never done    Colorectal cancer screening:  Type of screening: Cologuard. Completed 10/14/2019. Repeat every 3 years  Lung Cancer Screening: (Low Dose CT Chest recommended if Age 32-80 years, 30 pack-year currently smoking OR have quit w/in 15years.) does qualify.   Lung Cancer Screening Referral: ordered 03/16/2021  Additional Screening:  Hepatitis C Screening: does qualify; Completed 09/24/2019  Vision Screening: Recommended annual ophthalmology exams for early detection of glaucoma and other disorders of the eye. Is the patient up to date with their annual eye exam?  Yes  Who is the provider or what is the name of the office in which the patient attends annual eye exams? [facility] If pt is not established with a provider, would they like to be referred to a provider to establish care? No .   Dental Screening: Recommended annual dental exams for proper oral hygiene  Community Resource Referral / Chronic Care Management: CRR required this visit?  No   CCM required this visit?  No      Plan:     I have personally reviewed and noted the following in the patient's chart:   Medical and social history Use of alcohol, tobacco or illicit drugs  Current medications and supplements including opioid prescriptions. Patient is not currently taking opioid prescriptions. Functional ability and status Nutritional status Physical activity Advanced directives List of other physicians Hospitalizations, surgeries, and ER visits in previous 12 months Vitals Screenings to include cognitive, depression, and falls Referrals and appointments  In addition, I have reviewed and discussed with patient certain preventive protocols, quality metrics, and best practice recommendations. A written personalized care plan for preventive services as well as general preventive health recommendations were provided to patient.     Jerilee Field, RT   09/12/2021   Nurse Notes: 30 minute non face to face visit. AVS will be mailed to patient.

## 2021-09-22 DIAGNOSIS — H5203 Hypermetropia, bilateral: Secondary | ICD-10-CM | POA: Diagnosis not present

## 2021-09-27 DIAGNOSIS — H524 Presbyopia: Secondary | ICD-10-CM | POA: Diagnosis not present

## 2021-09-27 DIAGNOSIS — H52223 Regular astigmatism, bilateral: Secondary | ICD-10-CM | POA: Diagnosis not present

## 2021-10-15 ENCOUNTER — Telehealth: Payer: Self-pay | Admitting: Family Medicine

## 2021-10-15 NOTE — Telephone Encounter (Signed)
PT States he's not been able to obtain inhaler from [facility] [address] urgently. He said he was told he needed a prior authorization was needed for this. Pt states he has [employer] if that helps. Scheduled next available appt.   Pt states he also needs Blood Pressure  Medication but doesn't recall name. And also needs inhaler mentioned by name:  ADVAIR HFA 45-21 MCG/ACT inhaler [[id number]]   Pharmacy  [id number] - [place], [place] [address]  [address] [place] [address]  Phone:  [phone]  Fax:  [phone]

## 2021-10-19 ENCOUNTER — Ambulatory Visit (INDEPENDENT_AMBULATORY_CARE_PROVIDER_SITE_OTHER): Payer: Medicare HMO | Admitting: Nurse Practitioner

## 2021-10-19 ENCOUNTER — Encounter: Payer: Self-pay | Admitting: Nurse Practitioner

## 2021-10-19 ENCOUNTER — Other Ambulatory Visit: Payer: Self-pay

## 2021-10-19 VITALS — BP 144/86 | HR 87 | Resp 18

## 2021-10-19 DIAGNOSIS — I4891 Unspecified atrial fibrillation: Secondary | ICD-10-CM | POA: Diagnosis not present

## 2021-10-19 DIAGNOSIS — J449 Chronic obstructive pulmonary disease, unspecified: Secondary | ICD-10-CM

## 2021-10-19 MED ORDER — TRELEGY ELLIPTA 100-62.5-25 MCG/ACT IN AEPB: 1.0000 | INHALATION_SPRAY | Freq: Every day | RESPIRATORY_TRACT | 11 refills | Status: DC

## 2021-10-19 NOTE — Progress Notes (Signed)
@Patient  ID: Ronald Lowe, male    DOB: 10-24-1956, 65 y.o.   MRN: [id number]  Chief Complaint  Patient presents with   Medication Refill    Referring provider: Dorna Mai, MD   HPI  Patient presents today for medication refill.  He states that he needs his Advair refilled.  Upon chart review it is noted that patient was seen in the ED on 06/13/2021 for COPD exacerbation.  He was started on Trelegy at that time.  He was told to discontinue Advair at that time.  He may still use albuterol as needed.  We will try to refill Trelegy today.  Patient did not bring medications to his office visit today and does not currently know what he is taking.  We discussed that it was noted that he was prescribed Eliquis and was started on amiodarone during hospital stay.  Patient states that he does not think he has taken Eliquis or amiodarone at this time.  We discussed that we will set him up an appointment with Lurena Joiner in the pharmacy at [facility] for medication management.  He was advised to take all of his medications to this upcoming appointment.  Patient will need a follow-up scheduled with pulmonary and cardiology.  We will place these referrals today.  Patient will need to establish care with a PCP in this office.  We will schedule a follow-up appointment for him with Dr. Redmond Pulling. Denies f/c/s, n/v/d, hemoptysis, PND, chest pain or edema.     No Known Allergies  Immunization History  Administered Date(s) Administered   PFIZER(Purple Top)SARS-COV-2 Vaccination 03/20/2020, 04/10/2020   Pneumococcal Conjugate-13 09/24/2019   Tdap 09/24/2019    Past Medical History:  Diagnosis Date   Chronic kidney disease    Hyperlipidemia    Hypertension    Tobacco abuse     Tobacco History: Social History   Tobacco Use  Smoking Status Every Day   Packs/day: 1.00   Years: 50.00   Pack years: 50.00   Types: Cigarettes   Start date: 12/19/1970  Smokeless Tobacco Never   Ready to  quit: No Counseling given: Yes   Outpatient Encounter Medications as of 10/19/2021  Medication Sig   Fluticasone-Umeclidin-Vilant (TRELEGY ELLIPTA) 100-62.5-25 MCG/ACT AEPB Inhale 1 puff into the lungs daily.   albuterol (VENTOLIN HFA) 108 (90 Base) MCG/ACT inhaler Inhale 2 puffs into the lungs every 6 (six) hours as needed for wheezing or shortness of breath.   amLODipine (NORVASC) 5 MG tablet TAKE 1 TABLET (5 MG TOTAL) BY MOUTH EVERY OTHER DAY. DX: I10 (Patient taking differently: Take 5 mg by mouth every other day.)   apixaban (ELIQUIS) 5 MG TABS tablet Take 1 tablet (5 mg total) by mouth 2 (two) times daily.   atorvastatin (LIPITOR) 40 MG tablet Take 1 tablet (40 mg total) by mouth daily.   folic acid (FOLVITE) 1 MG tablet Take 1 tablet (1 mg total) by mouth daily.   metoprolol tartrate (LOPRESSOR) 25 MG tablet Take 0.5 tablets (12.5 mg total) by mouth 2 (two) times daily.   Multiple Vitamins-Minerals (CERTAVITE/ANTIOXIDANTS) TABS Take 1 tablet by mouth daily.   naltrexone (DEPADE) 50 MG tablet Take 1 tablet (50 mg total) by mouth daily.   pantoprazole (PROTONIX) 40 MG tablet Take 1 tablet (40 mg total) by mouth daily.   thiamine 100 MG tablet Take 1 tablet (100 mg total) by mouth daily.   [DISCONTINUED] ADVAIR HFA 45-21 MCG/ACT inhaler Inhale 2 puffs into the lungs 2 (two) times  daily.   [DISCONTINUED] Fluticasone-Umeclidin-Vilant (TRELEGY ELLIPTA) 100-62.5-25 MCG/INH AEPB Inhale 1 Dose into the lungs daily.   No facility-administered encounter medications on file as of 10/19/2021.     Review of Systems  Review of Systems  Constitutional: Negative.   HENT: Negative.    Cardiovascular: Negative.   Gastrointestinal: Negative.   Allergic/Immunologic: Negative.   Neurological: Negative.   Psychiatric/Behavioral: Negative.        Physical Exam  BP (!) 144/86   Pulse 87   Resp 18   SpO2 95%   Wt Readings from Last 5 Encounters:  06/16/21 162 lb 4.1 oz (73.6 kg)  05/05/21  150 lb (68 kg)  04/22/21 150 lb (68 kg)  04/16/21 151 lb 6.4 oz (68.7 kg)  03/08/21 152 lb 3.2 oz (69 kg)     Physical Exam Vitals and nursing note reviewed.  Constitutional:      General: He is not in acute distress.    Appearance: He is well-developed.  Cardiovascular:     Rate and Rhythm: Normal rate.  Pulmonary:     Effort: Pulmonary effort is normal.     Breath sounds: Normal breath sounds.  Skin:    General: Skin is warm and dry.  Neurological:     Mental Status: He is alert and oriented to person, place, and time.      Assessment & Plan:   Atrial fibrillation with rapid ventricular response ([place]) Needs follow up with Cardiology for medication management  COPD:  Will place referral to pulmonary for follow up  Follow up:  Follow up to establish care with Dr. Redmond Pulling   Please schedule appointment with Lurena Joiner for medication management     Fenton Foy, NP 10/19/2021

## 2021-10-19 NOTE — Telephone Encounter (Signed)
Patient has appt on 10/27/2021

## 2021-10-19 NOTE — Assessment & Plan Note (Signed)
Needs follow up with Cardiology for medication management  COPD:  Will place referral to pulmonary for follow up  Follow up:  Follow up to establish care with Dr. Redmond Pulling   Please schedule appointment with Lurena Joiner for medication management

## 2021-10-19 NOTE — Patient Instructions (Addendum)
A-fib:  Needs follow up with Cardiology for medication management  COPD:  Will place referral to pulmonary for follow up  Follow up:  Follow up to establish care with Dr. Redmond Pulling   Please schedule appointment with Lurena Joiner for medication management

## 2021-10-20 ENCOUNTER — Ambulatory Visit: Payer: Medicare HMO | Attending: Family Medicine | Admitting: Pharmacist

## 2021-10-20 DIAGNOSIS — Z79899 Other long term (current) drug therapy: Secondary | ICD-10-CM | POA: Insufficient documentation

## 2021-10-20 NOTE — Progress Notes (Signed)
Patient presents for medication reconciliation and adherence visit accompanied by his wife Ronald Lowe who helps him manage his medications.   Medication Adherence Questionnaire (A score of 2 or more points indicates risk for nonadherence)  Do you know what each of your medicines is for? 1 (1 point if no)  Do you ever have trouble remembering to take your medicine? 2 (2 points if yes)  Do you ever not take a medicine because you feel you do not need it?  1 (1 point if yes)  Do you think that any of your medicines is not helping you? 1 (1 point if yes)  Do you have any physical problems such as vision loss that keep you from taking your medicines as prescribed?  0 (2 points if yes)  Do you think any of your medicine is causing a side effect?  0 (1 point if yes)  Do you know the names of ALL of your medicines? 1 (1 point if no)  Do you think that you need ALL of your medicines? 1 (1 point if no)  In the past 6 months, have you missed getting a refill or a new prescription filled on time?  1 (1 point if yes)  How often do you miss taking a dose of medicine?  2 Never (0 points), 1 or 2 times a month (0 points), 1 time a week (2 points), 2 or more times a week (2 points).   TOTAL SCORE 10   Patient has known adherence challenges. Barriers include: lack of knowledge, forgetfulness, lack of belief in necessity of treatment, concern for more harm than good, cost. Patient nor wife understand what all of his medications are for and feel that they are not needed.   Medication reconciliation completed and medication list updated. Additional details about medications:  -Patient's wife states he is not taking and does not need: nicotine patches, nicotine lozenges, naltrexone. She also has an old prescription of his for clindamycin with some capsules still in it which she states I can get rid of for her.  -Taking amlodipine and lisinopril every other day which is how they are prescribed. Per chart review, this is  because he experienced dizziness when taking these every day.  -Taking atorvastatin every other day rather than daily as prescribed due to experiencing muscle cramps with daily dosing. Reports not having cramps with taking it every day. He has three bottles of this. Could consider switching to high intensity rosuvastatin to see if he can take this daily without experiencing cramps.  -He is taking Eliquis once daily because he "feels weird" when he takes it. We discussed the importance of taking this twice daily as prescribed to prevent strokes and they say they will start doing this.  -Per wife, "he needs vitamins but doesn't need any other pills." Seems to be a lack of education about why medications are needed and hesitancy to take prescription medications.  -He is not taking amiodarone. It appears per chart review he was to take this for a month which has passed. Cardiology referral has gone through, provided him with the phone number for [facility] to call and schedule his appt so he can follow up with them.  -Wife reports that he needs pantoprazole for his heartburn but he is out of it -He doesn't take his inhaler every day. Much confusion relating to his correct inhaler regimen. He has an Advair inhaler and reports using his neighbor's Spiriva as well. However he is asking for Trelegy but  his wife says they have not picked this up because it is $400. Patient says he was feeling better when he was on Trelegy but then would stop taking it because he would feel better. Discussed that he was probably feeling better because he was taking it and he should be taking his inhalers daily. He does not recognize the name albuterol so it does not seem he is taking this. He thinks he is supposed to be on Advair and Trelegy. Discussed that he would not take these together since they contain similar medications.   Counseled patient that the purpose of our visit is to gain accurate information about what he is taking  and that he needs to keep his appt next week with Dr. Redmond Pulling to discuss what he should be taking.   Ronald Lowe, PharmD PGY2 Ambulatory Care Pharmacy Resident 10/20/2021 2:47 PM

## 2021-10-27 ENCOUNTER — Ambulatory Visit (INDEPENDENT_AMBULATORY_CARE_PROVIDER_SITE_OTHER): Payer: Medicare HMO | Admitting: Family Medicine

## 2021-10-27 ENCOUNTER — Other Ambulatory Visit: Payer: Self-pay

## 2021-10-27 ENCOUNTER — Telehealth: Payer: Self-pay | Admitting: Family Medicine

## 2021-10-27 VITALS — BP 122/87 | HR 111 | Temp 98.1°F | Resp 16 | Wt 153.8 lb

## 2021-10-27 DIAGNOSIS — I4891 Unspecified atrial fibrillation: Secondary | ICD-10-CM | POA: Diagnosis not present

## 2021-10-27 DIAGNOSIS — J449 Chronic obstructive pulmonary disease, unspecified: Secondary | ICD-10-CM | POA: Diagnosis not present

## 2021-10-27 DIAGNOSIS — J439 Emphysema, unspecified: Secondary | ICD-10-CM | POA: Diagnosis not present

## 2021-10-27 DIAGNOSIS — I10 Essential (primary) hypertension: Secondary | ICD-10-CM | POA: Diagnosis not present

## 2021-10-27 DIAGNOSIS — Z87891 Personal history of nicotine dependence: Secondary | ICD-10-CM

## 2021-10-27 NOTE — Telephone Encounter (Signed)
Last seen 10/27/21 pt came back stating he needs to go over his medication list with the provider I did inform I will get this message over to the nurse and she will have up to 48 hrs to respond

## 2021-10-27 NOTE — Progress Notes (Signed)
Established Patient Office Visit  Subjective:  Patient ID: Ronald Lowe, male    DOB: 04/14/1956  Age: 65 y.o. MRN: [id number]  CC:  Chief Complaint  Patient presents with   Follow-up    HPI AMITAI DELAUGHTER presents for routine follow up of chronic med issues. Patient denies acute complaints or concerns.   Past Medical History:  Diagnosis Date   Atrial fibrillation ([place])    Chronic kidney disease    Hyperlipidemia    Hypertension    Tobacco abuse     Past Surgical History:  Procedure Laterality Date   TONSILLECTOMY      Family History  Problem Relation Age of Onset   Emphysema Father    CAD Father        CABG   Stroke Mother    CAD Brother 14       CABG   Hypertension Brother     Social History   Socioeconomic History   Marital status: Married    Spouse name: Not on file   Number of children: 2   Years of education: Not on file   Highest education level: Not on file  Occupational History   Occupation: retired  Tobacco Use   Smoking status: Every Day    Packs/day: 1.00    Years: 50.00    Pack years: 50.00    Types: Cigarettes    Start date: 12/19/1970   Smokeless tobacco: Never  Vaping Use   Vaping Use: Never used  Substance and Sexual Activity   Alcohol use: Yes    Alcohol/week: 42.0 standard drinks    Types: 28 Cans of beer, 14 Shots of liquor per week    Comment: did not drink yesterday, was shaky today and wife gave him one shot pta   Drug use: No   Sexual activity: Yes    Partners: Female    Birth control/protection: None  Other Topics Concern   Not on file  Social History Narrative   Lives with girlfriend.     Social Determinants of Health   Financial Resource Strain: Low Risk    Difficulty of Paying Living Expenses: Not hard at all  Food Insecurity: No Food Insecurity   Worried About Charity fundraiser in the Last Year: Never true   [place] in the Last Year: Never true  Transportation Needs: No Transportation Needs    Lack of Transportation (Medical): No   Lack of Transportation (Non-Medical): No  Physical Activity: Insufficiently Active   Days of Exercise per Week: 3 days   Minutes of Exercise per Session: 20 min  Stress: No Stress Concern Present   Feeling of Stress : Not at all  Social Connections: Moderately Isolated   Frequency of Communication with Friends and Family: Twice a week   Frequency of Social Gatherings with Friends and Family: More than three times a week   Attends Religious Services: Never   Marine scientist or Organizations: Yes   Attends Archivist Meetings: Never   Marital Status: Separated  Intimate Partner Violence: Not At Risk   Fear of Current or Ex-Partner: No   Emotionally Abused: No   Physically Abused: No   Sexually Abused: No    ROS Review of Systems  Respiratory: Negative.    Cardiovascular: Negative.   All other systems reviewed and are negative.  Objective:   Today's Vitals: BP 122/87   Pulse (!) 111   Temp 98.1 F (36.7 C) (Oral)  Resp 16   Wt 153 lb 12.8 oz (69.8 kg)   BMI 20.86 kg/m   Physical Exam Vitals and nursing note reviewed.  Constitutional:      General: He is not in acute distress. Cardiovascular:     Rate and Rhythm: Normal rate and regular rhythm.  Pulmonary:     Effort: Pulmonary effort is normal.     Breath sounds: Normal breath sounds.  Abdominal:     Palpations: Abdomen is soft.     Tenderness: There is no abdominal tenderness.  Musculoskeletal:     Right lower leg: No edema.     Left lower leg: No edema.  Neurological:     General: No focal deficit present.     Mental Status: He is alert and oriented to person, place, and time.    Assessment & Plan:   1. Atrial fibrillation with rapid ventricular response ([place]) Management as per consultant. Compliance discussed.  2. Chronic obstructive pulmonary disease, unspecified COPD type ([place]) Appears stable with present management. Continue. Management as per  consultant.  3. Pulmonary emphysema, unspecified emphysema type ([place]) As above  4. Hypertension, unspecified type Appears stable. Continue   5. History of smoking Encouraged reduction/cessation.     Outpatient Encounter Medications as of 10/27/2021  Medication Sig   albuterol (VENTOLIN HFA) 108 (90 Base) MCG/ACT inhaler Inhale 2 puffs into the lungs every 6 (six) hours as needed for wheezing or shortness of breath.   amLODipine (NORVASC) 5 MG tablet TAKE 1 TABLET (5 MG TOTAL) BY MOUTH EVERY OTHER DAY. DX: I10 (Patient taking differently: Take 5 mg by mouth every other day.)   apixaban (ELIQUIS) 5 MG TABS tablet Take 1 tablet (5 mg total) by mouth 2 (two) times daily.   atorvastatin (LIPITOR) 40 MG tablet Take 1 tablet (40 mg total) by mouth daily.   fluticasone-salmeterol (ADVAIR HFA) 45-21 MCG/ACT inhaler Inhale 2 puffs into the lungs 2 (two) times daily.   Fluticasone-Umeclidin-Vilant (TRELEGY ELLIPTA) 100-62.5-25 MCG/ACT AEPB Inhale 1 puff into the lungs daily.   folic acid (FOLVITE) 1 MG tablet Take 1 tablet (1 mg total) by mouth daily.   lisinopril (ZESTRIL) 5 MG tablet Take 2.5 mg by mouth every other day.   metoprolol tartrate (LOPRESSOR) 25 MG tablet Take 0.5 tablets (12.5 mg total) by mouth 2 (two) times daily.   Multiple Vitamins-Minerals (CERTAVITE/ANTIOXIDANTS) TABS Take 1 tablet by mouth daily.   naltrexone (DEPADE) 50 MG tablet Take 1 tablet (50 mg total) by mouth daily.   pantoprazole (PROTONIX) 40 MG tablet Take 1 tablet (40 mg total) by mouth daily.   thiamine 100 MG tablet Take 1 tablet (100 mg total) by mouth daily.   No facility-administered encounter medications on file as of 10/27/2021.    Follow-up: No follow-ups on file.   Becky Sax, MD

## 2021-10-28 ENCOUNTER — Other Ambulatory Visit: Payer: Self-pay | Admitting: *Deleted

## 2021-10-28 DIAGNOSIS — I10 Essential (primary) hypertension: Secondary | ICD-10-CM

## 2021-10-28 MED ORDER — METOPROLOL TARTRATE 25 MG PO TABS
12.5000 mg | ORAL_TABLET | Freq: Two times a day (BID) | ORAL | 0 refills | Status: DC
Start: 2021-10-28 — End: 2021-11-19

## 2021-10-28 MED ORDER — ATORVASTATIN CALCIUM 40 MG PO TABS: 40.0000 mg | ORAL_TABLET | Freq: Every day | ORAL | 3 refills | Status: DC

## 2021-10-28 MED ORDER — PANTOPRAZOLE SODIUM 40 MG PO TBEC: 40.0000 mg | DELAYED_RELEASE_TABLET | Freq: Every day | ORAL | 1 refills | Status: DC

## 2021-10-28 MED ORDER — LISINOPRIL 5 MG PO TABS
2.5000 mg | ORAL_TABLET | ORAL | 0 refills | Status: DC
Start: 2021-10-28 — End: 2022-03-14

## 2021-10-28 NOTE — Telephone Encounter (Signed)
Patient has been called and msg was left to give the office a call back if he still have questions regarding medication

## 2021-11-08 ENCOUNTER — Other Ambulatory Visit (HOSPITAL_COMMUNITY): Payer: Self-pay

## 2021-11-15 NOTE — Progress Notes (Signed)
CARDIOLOGY CONSULT NOTE       Patient ID: Ronald Lowe MRN: [id number] DOB/AGE: 1956-03-30 65 y.o.  Admit date: (Not on file) Referring Physician: Redmond Pulling Primary Physician: Dorna Mai, MD Primary Cardiologist: New Reason for Consultation: Afib  Active Problems:   * No active hospital problems. *   HPI:  65 y.o. referred by Dr Redmond Pulling for afib. History of smoking, HTN, HLD and CRF.He has muscle cramps with statin He was taking his eliquis daily not bid "felt weird when taking bid. Discussed with pharmacist.  12/20/20 Also no longer taking amiodarone   Hospitalized 06/18/21 with multi focal pneumonia and ETOH withdrawal Had PAF while withdrawing. Rx amiodarone and transitioned to 200 mg daily Baseline Cr 1.9 TTE with EF 50-55% trivial MR Mild LAE 3.9 cm  Was in NSR at D/c with plans to d/c amiodarone after 30 days   He indicates he has not smoked for 2 weeks or drank for same time period He is tremulous in office. Also flu positive 10/31/2021. He is back in afib. He has not been compliant with his meds and only taking eliquis once qod. Discussed stroke risk   ROS All other systems reviewed and negative except as noted above  Past Medical History:  Diagnosis Date   Atrial fibrillation ([facility])    Chronic kidney disease    Hyperlipidemia    Hypertension    Tobacco abuse     Family History  Problem Relation Age of Onset   Emphysema Father    CAD Father        CABG   Stroke Mother    CAD Brother 37       CABG   Hypertension Brother     Social History   Socioeconomic History   Marital status: Married    Spouse name: Not on file   Number of children: 2   Years of education: Not on file   Highest education level: Not on file  Occupational History   Occupation: retired  Tobacco Use   Smoking status: Every Day    Packs/day: 1.00    Years: 50.00    Pack years: 50.00    Types: Cigarettes    Start date: 12/19/1970   Smokeless tobacco: Never  Vaping Use   Vaping Use: Never  used  Substance and Sexual Activity   Alcohol use: Yes    Alcohol/week: 42.0 standard drinks    Types: 28 Cans of beer, 14 Shots of liquor per week    Comment: did not drink yesterday, was shaky today and wife gave him one shot pta   Drug use: No   Sexual activity: Yes    Partners: Female    Birth control/protection: None  Other Topics Concern   Not on file  Social History Narrative   Lives with girlfriend.     Social Determinants of Health   Financial Resource Strain: Low Risk    Difficulty of Paying Living Expenses: Not hard at all  Food Insecurity: No Food Insecurity   Worried About Charity fundraiser in the Last Year: Never true   [place] in the Last Year: Never true  Transportation Needs: No Transportation Needs   Lack of Transportation (Medical): No   Lack of Transportation (Non-Medical): No  Physical Activity: Insufficiently Active   Days of Exercise per Week: 3 days   Minutes of Exercise per Session: 20 min  Stress: No Stress Concern Present   Feeling of Stress : Not at all  Social Connections: Moderately Isolated   Frequency of Communication with Friends and Family: Twice a week   Frequency of Social Gatherings with Friends and Family: More than three times a week   Attends Religious Services: Never   Marine scientist or Organizations: Yes   Attends Archivist Meetings: Never   Marital Status: Separated  Intimate Partner Violence: Not At Risk   Fear of Current or Ex-Partner: No   Emotionally Abused: No   Physically Abused: No   Sexually Abused: No    Past Surgical History:  Procedure Laterality Date   TONSILLECTOMY        Current Outpatient Medications:    albuterol (VENTOLIN HFA) 108 (90 Base) MCG/ACT inhaler, Inhale 2 puffs into the lungs every 6 (six) hours as needed for wheezing or shortness of breath., Disp: 18 g, Rfl: 1   amLODipine (NORVASC) 5 MG tablet, TAKE 1 TABLET (5 MG TOTAL) BY MOUTH EVERY OTHER DAY. DX: I10 (Patient  taking differently: Take 5 mg by mouth every other day.), Disp: 90 tablet, Rfl: 1   apixaban (ELIQUIS) 5 MG TABS tablet, Take 1 tablet (5 mg total) by mouth 2 (two) times daily., Disp: 60 tablet, Rfl: 0   atorvastatin (LIPITOR) 40 MG tablet, Take 1 tablet (40 mg total) by mouth daily., Disp: 90 tablet, Rfl: 3   fluticasone-salmeterol (ADVAIR HFA) 45-21 MCG/ACT inhaler, Inhale 2 puffs into the lungs 2 (two) times daily., Disp: , Rfl:    lisinopril (ZESTRIL) 5 MG tablet, Take 0.5 tablets (2.5 mg total) by mouth every other day., Disp: 90 tablet, Rfl: 0   pantoprazole (PROTONIX) 40 MG tablet, Take 1 tablet (40 mg total) by mouth daily., Disp: 90 tablet, Rfl: 1    Physical Exam: Blood pressure (!) 132/92, pulse (!) 110, height 6' (1.829 m), weight 148 lb (67.1 kg), SpO2 95 %.    Affect appropriate Chronically ill male  HEENT: facial erythema  Neck supple with no adenopathy JVP normal no bruits no thyromegaly Lungs clear with no wheezing and good diaphragmatic motion Heart:  S1/S2 no murmur, no rub, gallop or click PMI normal Abdomen: benighn, BS positve, no tenderness, no AAA no bruit.  No HSM or HJR Distal pulses intact with no bruits No edema Neuro  tremulous no full blown DT;s  Skin warm and dry No muscular weakness   Labs:   Lab Results  Component Value Date   WBC 6.6 11/17/2021   HGB 15.8 11/17/2021   HCT 45.4 11/17/2021   MCV 98.7 11/17/2021   PLT 217 11/17/2021    Recent Labs  Lab 11/17/21 0030  NA 133*  K 3.9  CL 99  CO2 22  BUN 15  CREATININE 1.23  CALCIUM 8.5*  PROT 6.9  BILITOT 0.9  ALKPHOS 50  ALT 25  AST 43*  GLUCOSE 135*   Lab Results  Component Value Date   CKTOTAL 225 04/07/2021   CKMB 4.7 (H) 06/12/2014    Lab Results  Component Value Date   CHOL 154 03/24/2021   CHOL 182 09/24/2019   CHOL 143 03/22/2010   Lab Results  Component Value Date   HDL 74 03/24/2021   HDL 68 09/24/2019   HDL 48 03/22/2010   Lab Results  Component Value  Date   LDLCALC 66 03/24/2021   LDLCALC 99 09/24/2019   LDLCALC 83 03/22/2010   Lab Results  Component Value Date   TRIG 74 03/24/2021   TRIG 84 09/24/2019   TRIG 61 03/22/2010  Lab Results  Component Value Date   CHOLHDL 2.1 03/24/2021   CHOLHDL 2.7 09/24/2019   CHOLHDL 3.0 Ratio 03/22/2010   No results found for: LDLDIRECT    Radiology: [facility] Chest Port 1 View  Result Date: 11/17/2021 CLINICAL DATA:  Productive cough and respiratory distress. EXAM: PORTABLE CHEST 1 VIEW COMPARISON:  June 15, 2021 FINDINGS: The lungs are hyperinflated. Mild, diffuse, chronic appearing increased interstitial lung markings are noted. Mild atelectatic changes are seen within the bilateral lung bases. There is no evidence of a pleural effusion or pneumothorax. The heart size and mediastinal contours are within normal limits. Moderate severity calcification of the aortic arch is seen. The visualized skeletal structures are unremarkable. IMPRESSION: COPD with mild bibasilar atelectatic changes. Electronically Signed   By: Virgina Norfolk M.D.   On: 11/17/2021 00:45    EKG: SR rate 98 LVH 06/17/21    ASSESSMENT AND PLAN:   PAF:  June 2022 in setting of ETOH withdrawal and pneumonia converted to NSR on amiodarone now d/c Supposed to be on Eliquis  CHADVASC 2 Back in afib ? Related to Flue or withdrawal D/c norvasc start cardizem CD 240 mg for rate control Resume eliquis 5 bid discussed with wife. Resume amiodarone 200 mg bid F/U with me or afib clinic in 4 weeks  Smoking : counseled on cessation No cancer on CTA 06/14/21 F/U CT lung cancer screening [facility] June 2023 Has smoked 1-2 ppd for over 50 years  HTN:  Well controlled.  Continue current medications and low sodium Dash type diet.   HLD:  on statin LDL 66 03/23/21 ETOH:  counseled on moderation f/u primary ? Needing outpatient valium    F/U 4 weeks   Signed: Jenkins Rouge 11/19/2021, 3:59 PM

## 2021-11-16 ENCOUNTER — Encounter (HOSPITAL_COMMUNITY): Payer: Self-pay | Admitting: *Deleted

## 2021-11-16 ENCOUNTER — Emergency Department (HOSPITAL_COMMUNITY)
Admission: EM | Admit: 2021-11-16 | Discharge: 2021-11-17 | Disposition: A | Discharge status: Home / Self Care | Payer: Medicare HMO | Attending: Emergency Medicine | Admitting: Emergency Medicine

## 2021-11-16 ENCOUNTER — Other Ambulatory Visit: Payer: Self-pay

## 2021-11-16 DIAGNOSIS — F17210 Nicotine dependence, cigarettes, uncomplicated: Secondary | ICD-10-CM | POA: Insufficient documentation

## 2021-11-16 DIAGNOSIS — J80 Acute respiratory distress syndrome: Secondary | ICD-10-CM | POA: Diagnosis not present

## 2021-11-16 DIAGNOSIS — I10 Essential (primary) hypertension: Secondary | ICD-10-CM | POA: Diagnosis not present

## 2021-11-16 DIAGNOSIS — N189 Chronic kidney disease, unspecified: Secondary | ICD-10-CM | POA: Diagnosis not present

## 2021-11-16 DIAGNOSIS — J101 Influenza due to other identified influenza virus with other respiratory manifestations: Secondary | ICD-10-CM | POA: Diagnosis not present

## 2021-11-16 DIAGNOSIS — R0689 Other abnormalities of breathing: Secondary | ICD-10-CM | POA: Diagnosis not present

## 2021-11-16 DIAGNOSIS — R000 Tachycardia, unspecified: Secondary | ICD-10-CM | POA: Insufficient documentation

## 2021-11-16 DIAGNOSIS — Z79899 Other long term (current) drug therapy: Secondary | ICD-10-CM | POA: Diagnosis not present

## 2021-11-16 DIAGNOSIS — R059 Cough, unspecified: Secondary | ICD-10-CM | POA: Diagnosis not present

## 2021-11-16 DIAGNOSIS — Z20822 Contact with and (suspected) exposure to covid-19: Secondary | ICD-10-CM | POA: Insufficient documentation

## 2021-11-16 DIAGNOSIS — J449 Chronic obstructive pulmonary disease, unspecified: Secondary | ICD-10-CM | POA: Insufficient documentation

## 2021-11-16 DIAGNOSIS — I700 Atherosclerosis of aorta: Secondary | ICD-10-CM | POA: Diagnosis not present

## 2021-11-16 DIAGNOSIS — Z7901 Long term (current) use of anticoagulants: Secondary | ICD-10-CM | POA: Insufficient documentation

## 2021-11-16 DIAGNOSIS — R0602 Shortness of breath: Secondary | ICD-10-CM | POA: Diagnosis not present

## 2021-11-16 DIAGNOSIS — I129 Hypertensive chronic kidney disease with stage 1 through stage 4 chronic kidney disease, or unspecified chronic kidney disease: Secondary | ICD-10-CM | POA: Insufficient documentation

## 2021-11-16 LAB — CBG MONITORING, ED: Glucose-Capillary: 151 mg/dL — ABNORMAL HIGH (ref 70–99)

## 2021-11-16 NOTE — ED Triage Notes (Signed)
Pt from home by EMS for lethargy and sob. Per EMS, wife called due to pt being hot and lethargic. Pt reports sob ongoing x 1 month with productive cough (yellow phlegm). Hx of COPD. EMS VS resp 40, 138/0, RA 90%, temp 102. EMS gave 10mg  Albuterol, 0.5mg  Atrovent, 125mg  Solumedrol, 2g Mag

## 2021-11-16 NOTE — ED Provider Notes (Signed)
[facility] EMERGENCY DEPARTMENT Provider Note   CSN: [id number] Arrival date & time: 11/10/2021  2334     History Chief Complaint  Patient presents with   COPD   Respiratory Distress    ALDYN TOON is a 65 y.o. male.  The history is provided by the patient, the EMS personnel and medical records.  COPD TEANCUM BRULE is a 65 y.o. male who presents to the Emergency Department complaining of sob. He presents to the ED for evaluation by EMS for sob.  Sxs started about one month ago.  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  He had worsening SOB after the event.    Started running a fever yesterday.  Has a cough since thanksgiving.  Started taking theraflu on sunday.  Cough is productive of white sputum.  Has decreased oral intake due to poor appetite.  He is drinking ensure.  No abdominal pain, N/V/D.  No lower extremity edema.     Smokes tobacco - quit last week.  Quit alcohol and marijuana last week.  He is unsure of his medications - they are given to him by his wife.    EMS reports pt being lethargic.  Temp to 102.  Treated with 10 mg albuterol 0.5mg  atrovent, 125 mg solumedrol, 2 g mag prior to ED arrival.      Past Medical History:  Diagnosis Date   Atrial fibrillation ([place])    Chronic kidney disease    Hyperlipidemia    Hypertension    Tobacco abuse     Patient Active Problem List   Diagnosis Date Noted   Medication management 10/20/2021   Chronic obstructive pulmonary disease ([place]) 10/19/2021   Alcohol withdrawal delirium ([place]) 06/17/2021   Atrial fibrillation with rapid ventricular response ([place]) 06/17/2021   Macrocytic anemia 06/17/2021   COPD exacerbation ([place]) 06/14/2021   Multifocal pneumonia    CAP (community acquired pneumonia) 06/13/2021   Emphysema lung ([place]) 03/17/2021   Positive hepatitis C antibody test 10/02/2019   Tobacco dependence 09/24/2019   Alcohol use disorder, mild, abuse 09/24/2019    Orthostatic hypotension 06/12/2014   EKG abnormality 06/12/2014   HTN (hypertension) 06/12/2014    Past Surgical History:  Procedure Laterality Date   TONSILLECTOMY         Family History  Problem Relation Age of Onset   Emphysema Father    CAD Father        CABG   Stroke Mother    CAD Brother 40       CABG   Hypertension Brother     Social History   Tobacco Use   Smoking status: Every Day    Packs/day: 1.00    Years: 50.00    Pack years: 50.00    Types: Cigarettes    Start date: 12/19/1970   Smokeless tobacco: Never  Vaping Use   Vaping Use: Never used  Substance Use Topics   Alcohol use: Yes    Alcohol/week: 42.0 standard drinks    Types: 28 Cans of beer, 14 Shots of liquor per week    Comment: did not drink yesterday, was shaky today and wife gave him one shot pta   Drug use: No    Home Medications Prior to Admission medications   Medication Sig Start Date End Date Taking? Authorizing Provider  albuterol (VENTOLIN HFA) 108 (90 Base) MCG/ACT inhaler Inhale 2 puffs into the lungs every 6 (six) hours as needed for wheezing or shortness  of breath. 05/05/21   Nicolette Bang, MD  amLODipine (NORVASC) 5 MG tablet TAKE 1 TABLET (5 MG TOTAL) BY MOUTH EVERY OTHER DAY. DX: I10 Patient taking differently: Take 5 mg by mouth every other day. 05/20/21   Nicolette Bang, MD  apixaban (ELIQUIS) 5 MG TABS tablet Take 1 tablet (5 mg total) by mouth 2 (two) times daily. 06/18/21   Lurline Del, DO  atorvastatin (LIPITOR) 40 MG tablet Take 1 tablet (40 mg total) by mouth daily. 10/28/21   Dorna Mai, MD  fluticasone-salmeterol (ADVAIR HFA) [phone] MCG/ACT inhaler Inhale 2 puffs into the lungs 2 (two) times daily.    [provider]  Fluticasone-Umeclidin-Vilant (TRELEGY ELLIPTA) 100-62.5-25 MCG/ACT AEPB Inhale 1 puff into the lungs daily. 10/19/21   Fenton Foy, NP  folic acid (FOLVITE) 1 MG tablet Take 1 tablet (1 mg total) by mouth daily. 06/18/21    Lurline Del, DO  lisinopril (ZESTRIL) 5 MG tablet Take 0.5 tablets (2.5 mg total) by mouth every other day. 10/28/21   Dorna Mai, MD  metoprolol tartrate (LOPRESSOR) 25 MG tablet Take 0.5 tablets (12.5 mg total) by mouth 2 (two) times daily. 10/28/21   Dorna Mai, MD  Multiple Vitamins-Minerals (CERTAVITE/ANTIOXIDANTS) TABS Take 1 tablet by mouth daily. 06/18/21   Lurline Del, DO  naltrexone (DEPADE) 50 MG tablet Take 1 tablet (50 mg total) by mouth daily. 06/18/21   Lurline Del, DO  pantoprazole (PROTONIX) 40 MG tablet Take 1 tablet (40 mg total) by mouth daily. 10/28/21   Dorna Mai, MD  thiamine 100 MG tablet Take 1 tablet (100 mg total) by mouth daily. 06/18/21   Lurline Del, DO    Allergies    Patient has no known allergies.  Review of Systems   Review of Systems  All other systems reviewed and are negative.  Physical Exam Updated Vital Signs BP (!) 119/91   Pulse 100   Temp 98.7 F (37.1 C) (Oral)   Resp 20   SpO2 93%   Physical Exam Vitals and nursing note reviewed.  Constitutional:      Appearance: He is well-developed.  HENT:     Head: Normocephalic and atraumatic.  Cardiovascular:     Rate and Rhythm: Regular rhythm. Tachycardia present.     Heart sounds: No murmur heard. Pulmonary:     Effort: Pulmonary effort is normal. No respiratory distress.     Comments: Mildly decreased air movement bilaterally with occasional expiratory wheezes in the bases Abdominal:     Palpations: Abdomen is soft.     Tenderness: There is no abdominal tenderness. There is no guarding or rebound.  Musculoskeletal:        General: No swelling or tenderness.  Skin:    General: Skin is warm and dry.  Neurological:     Mental Status: He is alert and oriented to person, place, and time.  Psychiatric:        Behavior: Behavior normal.    ED Results / Procedures / Treatments   Labs (all labs ordered are listed, but only abnormal results are displayed) Labs Reviewed   RESP PANEL BY RT-PCR (FLU A&B, COVID) ARPGX2 - Abnormal; Notable for the following components:      Result Value   Influenza A by PCR POSITIVE (*)    All other components within normal limits  COMPREHENSIVE METABOLIC PANEL - Abnormal; Notable for the following components:   Sodium 133 (*)    Glucose, Bld 135 (*)    Calcium 8.5 (*)  Albumin 3.4 (*)    AST 43 (*)    All other components within normal limits  CBC WITH DIFFERENTIAL/PLATELET - Abnormal; Notable for the following components:   MCH 34.3 (*)    All other components within normal limits  CBG MONITORING, ED - Abnormal; Notable for the following components:   Glucose-Capillary 151 (*)    All other components within normal limits  URINE CULTURE  CULTURE, BLOOD (SINGLE)  LACTIC ACID, PLASMA  PROTIME-INR  APTT  LACTIC ACID, PLASMA  URINALYSIS, ROUTINE W REFLEX MICROSCOPIC    EKG EKG Interpretation  Date/Time:  Tuesday November 16 2021 23:50:44 EST Ventricular Rate:  121 PR Interval:  146 QRS Duration: 88 QT Interval:  320 QTC Calculation: 110 R Axis:   76 Text Interpretation: Sinus tachycardia Anterior infarct , age undetermined Abnormal ECG Confirmed by Quintella Reichert [phone]) on 10/27/2021 11:57:09 PM  Radiology DG Chest Port 1 View  Result Date: 11/17/2021 CLINICAL DATA:  Productive cough and respiratory distress. EXAM: PORTABLE CHEST 1 VIEW COMPARISON:  June 15, 2021 FINDINGS: The lungs are hyperinflated. Mild, diffuse, chronic appearing increased interstitial lung markings are noted. Mild atelectatic changes are seen within the bilateral lung bases. There is no evidence of a pleural effusion or pneumothorax. The heart size and mediastinal contours are within normal limits. Moderate severity calcification of the aortic arch is seen. The visualized skeletal structures are unremarkable. IMPRESSION: COPD with mild bibasilar atelectatic changes. Electronically Signed   By: Virgina Norfolk M.D.   On: 11/17/2021 00:45     Procedures Procedures   Medications Ordered in ED Medications  ipratropium-albuterol (DUONEB) 0.5-2.5 (3) MG/3ML nebulizer solution 3 mL (3 mLs Nebulization Given 11/17/21 0033)  dexamethasone (DECADRON) injection 10 mg (10 mg Intravenous Given 11/17/21 0302)    ED Course  I have reviewed the triage vital signs and the nursing notes.  Pertinent labs & imaging results that were available during my care of the patient were reviewed by me and considered in my medical decision making (see chart for details).    MDM Rules/Calculators/A&P                          Pt with hx/o AFIB on eliquis, COPD, HTN, alcohol abuse here with cough/sob.  Pt feeling significantly improved on ED arrival.  Pt with normal work of breathing, slightly diminished lung sounds.  No evidence of acute bacterial infection.  He is flu positive in ED.  Additional hx available from pt's wife after his initial eval.  She reports that he has been sick since before thanksgiving with increased cough fatigue.  Suspect he is too far into his influenza illness for benefit from antivirals at this time.  Pt improved after treatment in the ED.  Will treat with one time dose of dex for component of COPD exacerbation in setting of flu.  Plan to d/c home with outpatient follow up and return precautions.    Presentation is not c/w PE, ACS, acute CHF.   Final Clinical Impression(s) / ED Diagnoses Final diagnoses:  Influenza A    Rx / DC Orders ED Discharge Orders     None        Quintella Reichert, MD 11/17/21 [phone]

## 2021-11-17 ENCOUNTER — Emergency Department (HOSPITAL_COMMUNITY): Payer: Medicare HMO

## 2021-11-17 DIAGNOSIS — I700 Atherosclerosis of aorta: Secondary | ICD-10-CM | POA: Diagnosis not present

## 2021-11-17 DIAGNOSIS — J101 Influenza due to other identified influenza virus with other respiratory manifestations: Secondary | ICD-10-CM | POA: Diagnosis not present

## 2021-11-17 DIAGNOSIS — J449 Chronic obstructive pulmonary disease, unspecified: Secondary | ICD-10-CM | POA: Diagnosis not present

## 2021-11-17 DIAGNOSIS — R059 Cough, unspecified: Secondary | ICD-10-CM | POA: Diagnosis not present

## 2021-11-17 LAB — COMPREHENSIVE METABOLIC PANEL
ALT: 25 U/L (ref 0–44)
AST: 43 U/L — ABNORMAL HIGH (ref 15–41)
Albumin: 3.4 g/dL — ABNORMAL LOW (ref 3.5–5.0)
Alkaline Phosphatase: 50 U/L (ref 38–126)
Anion gap: 12 (ref 5–15)
BUN: 15 mg/dL (ref 8–23)
CO2: 22 mmol/L (ref 22–32)
Calcium: 8.5 mg/dL — ABNORMAL LOW (ref 8.9–10.3)
Chloride: 99 mmol/L (ref 98–111)
Creatinine, Ser: 1.23 mg/dL (ref 0.61–1.24)
GFR, Estimated: >60 mL/min (ref >60)
Glucose, Bld: 135 mg/dL — ABNORMAL HIGH (ref 70–99)
Potassium: 3.9 mmol/L (ref 3.5–5.1)
Sodium: 133 mmol/L — ABNORMAL LOW (ref 135–145)
Total Bilirubin: 0.9 mg/dL (ref 0.3–1.2)
Total Protein: 6.9 g/dL (ref 6.5–8.1)

## 2021-11-17 LAB — CBC WITH DIFFERENTIAL/PLATELET
Abs Immature Granulocytes: 0.03 10*3/uL (ref 0.00–0.07)
Basophils Absolute: 0 10*3/uL (ref 0.0–0.1)
Basophils Relative: 0 %
Eosinophils Absolute: 0 10*3/uL (ref 0.0–0.5)
Eosinophils Relative: 0 %
HCT: 45.4 % (ref 39.0–52.0)
Hemoglobin: 15.8 g/dL (ref 13.0–17.0)
Immature Granulocytes: 1 %
Lymphocytes Relative: 13 %
Lymphs Abs: 0.8 10*3/uL (ref 0.7–4.0)
MCH: 34.3 pg — ABNORMAL HIGH (ref 26.0–34.0)
MCHC: 34.8 g/dL (ref 30.0–36.0)
MCV: 98.7 fL (ref 80.0–100.0)
Monocytes Absolute: 0.7 10*3/uL (ref 0.1–1.0)
Monocytes Relative: 10 %
Neutro Abs: 5.1 10*3/uL (ref 1.7–7.7)
Neutrophils Relative %: 76 %
Platelets: 217 10*3/uL (ref 150–400)
RBC: 4.6 MIL/uL (ref 4.22–5.81)
RDW: 13.4 % (ref 11.5–15.5)
WBC: 6.6 10*3/uL (ref 4.0–10.5)
nRBC: 0 % (ref 0.0–0.2)

## 2021-11-17 LAB — RESP PANEL BY RT-PCR (FLU A&B, COVID) ARPGX2
Influenza A by PCR: POSITIVE — AB
Influenza B by PCR: NEGATIVE
SARS Coronavirus 2 by RT PCR: NEGATIVE

## 2021-11-17 LAB — PROTIME-INR
INR: 1 (ref 0.8–1.2)
Prothrombin Time: 12.8 seconds (ref 11.4–15.2)

## 2021-11-17 LAB — APTT: aPTT: 33 seconds (ref 24–36)

## 2021-11-17 LAB — LACTIC ACID, PLASMA: Lactic Acid, Venous: 1.1 mmol/L (ref 0.5–1.9)

## 2021-11-17 IMAGING — DX DG CHEST 1V PORT
1 series · 1 of 1 positions shown · non-contrast
Comparison: [DATE]

CLINICAL DATA: Productive cough and respiratory distress.

EXAM:
PORTABLE CHEST 1 VIEW

[chest ap]
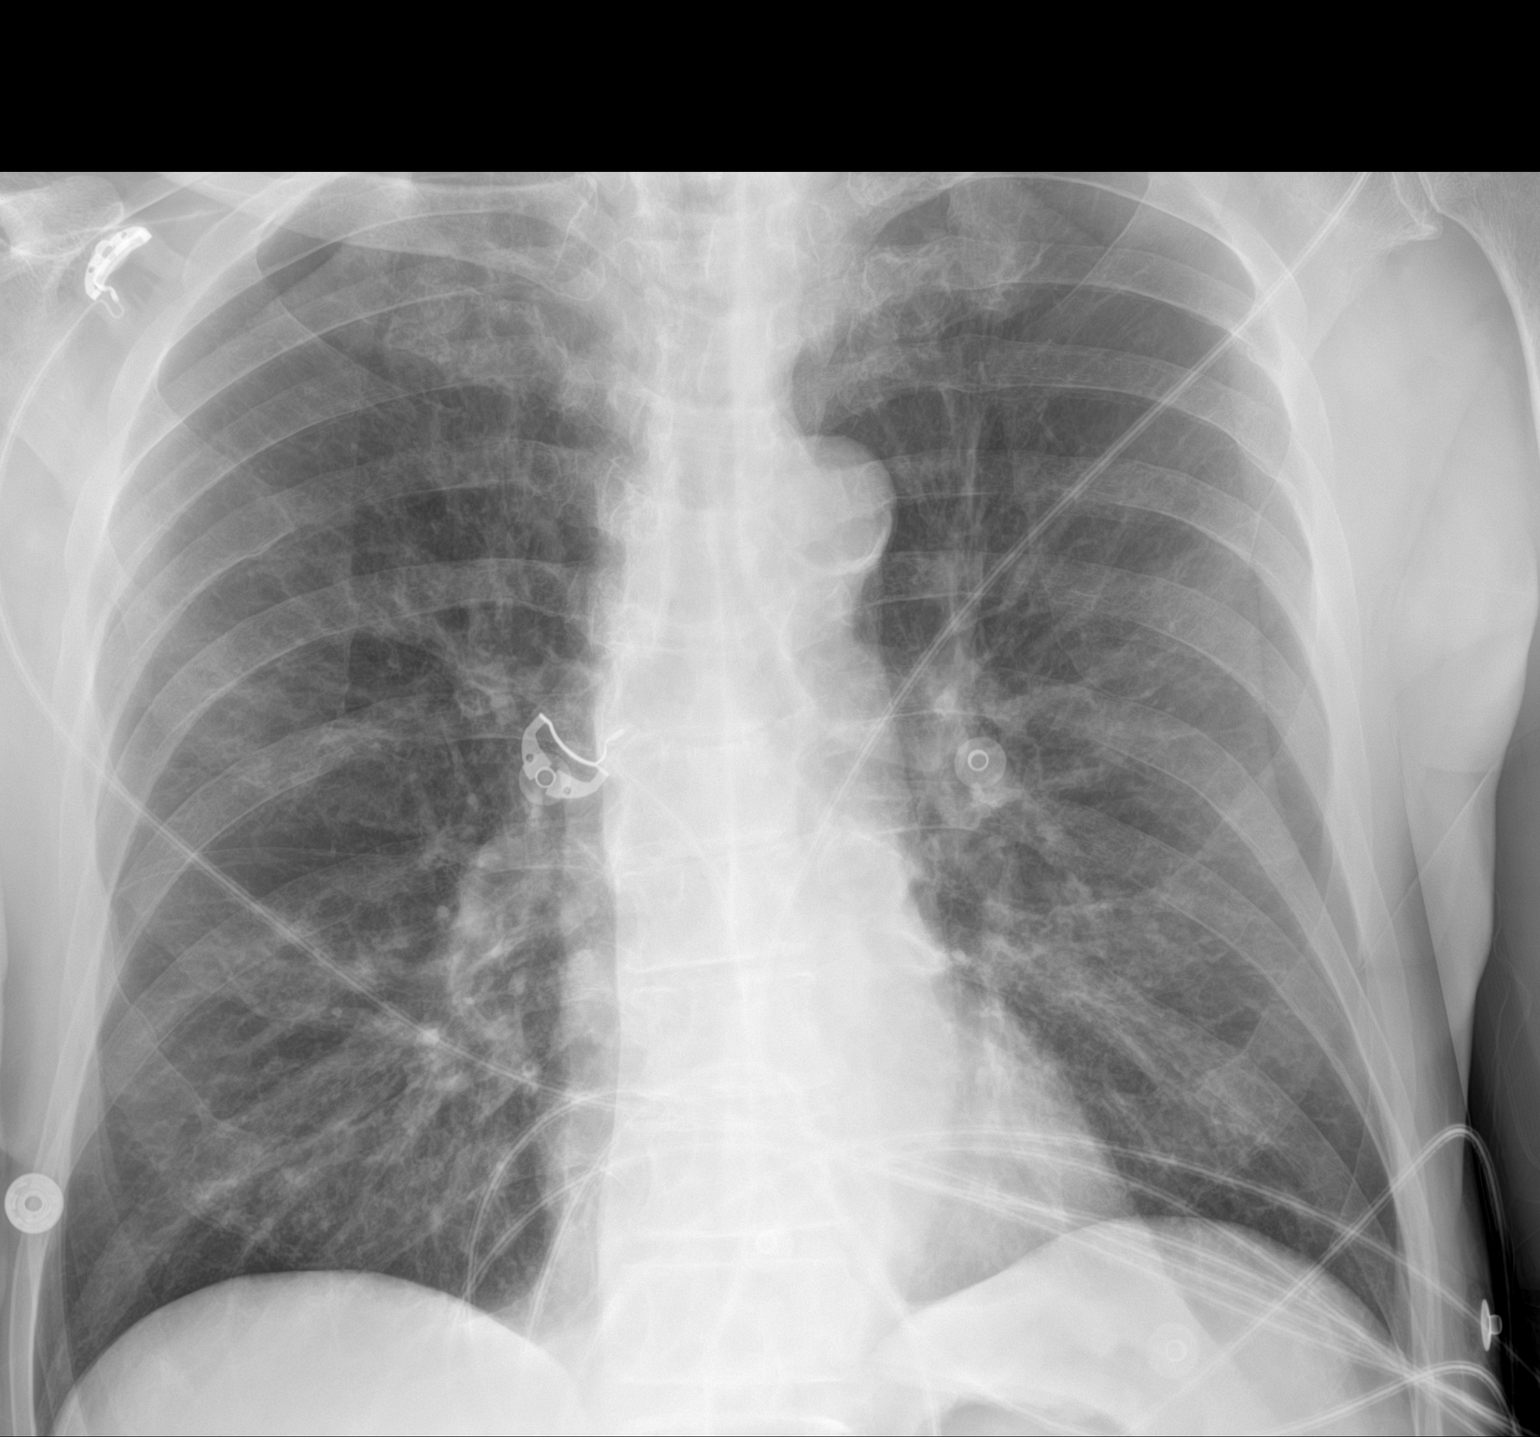

[1 of 1 positions shown; findings below may reference images not displayed]

FINDINGS: The lungs are hyperinflated. Mild, diffuse, chronic appearing
increased interstitial lung markings are noted. Mild atelectatic
changes are seen within the bilateral lung bases. There is no
evidence of a pleural effusion or pneumothorax. The heart size and
mediastinal contours are within normal limits. Moderate severity
calcification of the aortic arch is seen. The visualized skeletal
structures are unremarkable.
IMPRESSION: COPD with mild bibasilar atelectatic changes.

## 2021-11-17 MED ORDER — IPRATROPIUM-ALBUTEROL 0.5-2.5 (3) MG/3ML IN SOLN
3.0000 mL | Freq: Once | RESPIRATORY_TRACT | Status: AC
  Administered 2021-11-17: 3 mL via RESPIRATORY_TRACT
  Filled 2021-11-17: qty 3

## 2021-11-17 MED ORDER — DEXAMETHASONE SODIUM PHOSPHATE 10 MG/ML IJ SOLN
10.0000 mg | Freq: Once | INTRAMUSCULAR | Status: AC
  Administered 2021-11-17: 10 mg via INTRAVENOUS
  Filled 2021-11-17: qty 1

## 2021-11-17 NOTE — ED Notes (Signed)
Pt ambulated in hallway with steady gait with spo2 ranging from 93%-96%. Pt did c/o mild dizziness with walking. MD notified.

## 2021-11-17 NOTE — ED Notes (Signed)
Per MD - repeat lactic not needed.

## 2021-11-18 DIAGNOSIS — 419620001 Death: Secondary | SNOMED CT | POA: Diagnosis not present

## 2021-11-18 DEATH — deceased

## 2021-11-19 ENCOUNTER — Encounter: Payer: Self-pay | Admitting: Cardiovascular Disease

## 2021-11-19 ENCOUNTER — Other Ambulatory Visit: Payer: Self-pay

## 2021-11-19 ENCOUNTER — Ambulatory Visit (INDEPENDENT_AMBULATORY_CARE_PROVIDER_SITE_OTHER): Payer: Medicare HMO | Admitting: Cardiovascular Disease

## 2021-11-19 VITALS — BP 132/92 | HR 110 | Ht 72.0 in | Wt 148.0 lb

## 2021-11-19 DIAGNOSIS — J441 Chronic obstructive pulmonary disease with (acute) exacerbation: Secondary | ICD-10-CM | POA: Diagnosis not present

## 2021-11-19 DIAGNOSIS — I10 Essential (primary) hypertension: Secondary | ICD-10-CM | POA: Diagnosis not present

## 2021-11-19 DIAGNOSIS — I4891 Unspecified atrial fibrillation: Secondary | ICD-10-CM | POA: Diagnosis not present

## 2021-11-19 DIAGNOSIS — F17200 Nicotine dependence, unspecified, uncomplicated: Secondary | ICD-10-CM

## 2021-11-19 DIAGNOSIS — F10931 Alcohol use, unspecified with withdrawal delirium: Secondary | ICD-10-CM | POA: Diagnosis not present

## 2021-11-19 MED ORDER — AMIODARONE HCL 200 MG PO TABS: 200.0000 mg | ORAL_TABLET | Freq: Two times a day (BID) | ORAL | 3 refills | Status: DC

## 2021-11-19 MED ORDER — DILTIAZEM HCL ER COATED BEADS 240 MG PO CP24: 240.0000 mg | ORAL_CAPSULE | Freq: Every day | ORAL | 3 refills | Status: DC

## 2021-11-19 NOTE — Patient Instructions (Signed)
Medication Instructions:  Your physician has recommended you make the following change in your medication:  STOP NORVASC  START CARDIZEM 240 MG DAILY TAKE AMIODARONE 200 MG TWICE DAILY. *If you need a refill on your cardiac medications before your next appointment, please call your pharmacy*   Lab Work:  NONE If you have labs (blood work) drawn today and your tests are completely normal, you will receive your results only by: [place] (if you have MyChart) OR A paper copy in the mail If you have any lab test that is abnormal or we need to change your treatment, we will call you to review the results.   Testing/Procedures: NONE   Follow-Up: At [facility], you and your health needs are our priority.  As part of our continuing mission to provide you with exceptional heart care, we have created designated Provider Care Teams.  These Care Teams include your primary Cardiologist (physician) and Advanced Practice Providers (APPs -  Physician Assistants and Nurse Practitioners) who all work together to provide you with the care you need, when you need it.  We recommend signing up for the patient portal called "MyChart".  Sign up information is provided on this After Visit Summary.  MyChart is used to connect with patients for Virtual Visits (Telemedicine).  Patients are able to view lab/test results, encounter notes, upcoming appointments, etc.  Non-urgent messages can be sent to your provider as well.   To learn more about what you can do with MyChart, go to [email or web address].    Your next appointment:   4 week(s)  The format for your next appointment:   In Person  Provider:   DR. [name] OR [facility]

## 2021-11-22 LAB — CULTURE, BLOOD (SINGLE): Culture: NO GROWTH

## 2021-11-23 ENCOUNTER — Telehealth: Payer: Self-pay | Admitting: Family Medicine

## 2021-11-23 NOTE — Telephone Encounter (Signed)
URGENT PER PT Rx #: [id number]  apixaban (ELIQUIS) 5 MG TABS tablet [[id number]]   [facility] [address], [place], [place] [address]  NOT [facility] ANY LONGER PLEASE

## 2021-11-24 ENCOUNTER — Ambulatory Visit (INDEPENDENT_AMBULATORY_CARE_PROVIDER_SITE_OTHER): Payer: Medicare HMO | Admitting: Family Medicine

## 2021-11-24 ENCOUNTER — Encounter: Payer: Self-pay | Admitting: Family Medicine

## 2021-11-24 VITALS — BP 112/79 | HR 99 | Temp 97.8°F | Resp 18 | Wt 146.2 lb

## 2021-11-24 DIAGNOSIS — Z09 Encounter for follow-up examination after completed treatment for conditions other than malignant neoplasm: Secondary | ICD-10-CM | POA: Diagnosis not present

## 2021-11-24 DIAGNOSIS — Z87898 Personal history of other specified conditions: Secondary | ICD-10-CM

## 2021-11-24 DIAGNOSIS — I4891 Unspecified atrial fibrillation: Secondary | ICD-10-CM | POA: Diagnosis not present

## 2021-11-24 DIAGNOSIS — F341 Dysthymic disorder: Secondary | ICD-10-CM | POA: Diagnosis not present

## 2021-11-24 MED ORDER — APIXABAN 5 MG PO TABS: 5.0000 mg | ORAL_TABLET | Freq: Two times a day (BID) | ORAL | 0 refills | Status: DC

## 2021-11-24 MED ORDER — PAROXETINE HCL 20 MG PO TABS: 20.0000 mg | ORAL_TABLET | Freq: Every day | ORAL | 0 refills | Status: DC

## 2021-11-24 NOTE — Progress Notes (Signed)
Medication refill request Hospital f-up from flu

## 2021-11-25 ENCOUNTER — Encounter: Payer: Self-pay | Admitting: Family Medicine

## 2021-11-25 NOTE — Progress Notes (Signed)
Established Patient Office Visit  Subjective:  Patient ID: Ronald Lowe, male    DOB: [birth year]  Age: 65 y.o. MRN: [id number]  CC:  Chief Complaint  Patient presents with   Medication Refill    HFU-flu    HPI Ronald Lowe presents for hospital follow up and refill of meds. Patient denies acute complaints or concerns.   Past Medical History:  Diagnosis Date   Atrial fibrillation ([place])    Chronic kidney disease    Hyperlipidemia    Hypertension    Tobacco abuse     Past Surgical History:  Procedure Laterality Date   TONSILLECTOMY      Family History  Problem Relation Age of Onset   Emphysema Father    CAD Father        CABG   Stroke Mother    CAD Brother 75       CABG   Hypertension Brother     Social History   Socioeconomic History   Marital status: Married    Spouse name: Not on file   Number of children: 2   Years of education: Not on file   Highest education level: Not on file  Occupational History   Occupation: retired  Tobacco Use   Smoking status: Former    Packs/day: 1.00    Years: 50.00    Pack years: 50.00    Types: Cigarettes    Start date: 12/19/1970   Smokeless tobacco: Former  Scientific laboratory technician Use: Never used  Substance and Sexual Activity   Alcohol use: Not Currently    Alcohol/week: 42.0 standard drinks    Types: 28 Cans of beer, 14 Shots of liquor per week    Comment: did not drink yesterday, was shaky today and wife gave him one shot pta   Drug use: No   Sexual activity: Yes    Partners: Female    Birth control/protection: None  Other Topics Concern   Not on file  Social History Narrative   Lives with girlfriend.     Social Determinants of Health   Financial Resource Strain: Low Risk    Difficulty of Paying Living Expenses: Not hard at all  Food Insecurity: No Food Insecurity   Worried About Charity fundraiser in the Last Year: Never true   [place] in the Last Year: Never true  Transportation Needs: No  Transportation Needs   Lack of Transportation (Medical): No   Lack of Transportation (Non-Medical): No  Physical Activity: Insufficiently Active   Days of Exercise per Week: 3 days   Minutes of Exercise per Session: 20 min  Stress: No Stress Concern Present   Feeling of Stress : Not at all  Social Connections: Moderately Isolated   Frequency of Communication with Friends and Family: Twice a week   Frequency of Social Gatherings with Friends and Family: More than three times a week   Attends Religious Services: Never   Marine scientist or Organizations: Yes   Attends Archivist Meetings: Never   Marital Status: Separated  Intimate Partner Violence: Not At Risk   Fear of Current or Ex-Partner: No   Emotionally Abused: No   Physically Abused: No   Sexually Abused: No    ROS Review of Systems  Respiratory:  Positive for shortness of breath.   Neurological:  Negative for seizures.  Psychiatric/Behavioral:  Negative for self-injury, sleep disturbance and suicidal ideas. The patient is not nervous/anxious.  All other systems reviewed and are negative.  Objective:   Today's Vitals: BP 112/79   Pulse 99   Temp 97.8 F (36.6 C) (Oral)   Resp 18   Wt 146 lb 3.2 oz (66.3 kg)   SpO2 95%   BMI 19.83 kg/m   Physical Exam Vitals and nursing note reviewed.  Constitutional:      General: He is not in acute distress. Cardiovascular:     Rate and Rhythm: Normal rate and regular rhythm.  Pulmonary:     Effort: Pulmonary effort is normal.     Breath sounds: Normal breath sounds.  Abdominal:     Palpations: Abdomen is soft.     Tenderness: There is no abdominal tenderness.  Neurological:     General: No focal deficit present.     Mental Status: He is alert and oriented to person, place, and time.    Assessment & Plan:   1. Dysthymia Paxil 20 mg prescribed. Will monitor  2. Atrial fibrillation with rapid ventricular response (HCC) Refilled eliquis. Patient to  follow up with consultant for continued eval/management.   3. History of heavy alcohol consumption Discussed decreasing consumption  4. Hospital discharge follow-up Continued improvement since discharge.     Outpatient Encounter Medications as of 11/24/2021  Medication Sig   PARoxetine (PAXIL) 20 MG tablet Take 1 tablet (20 mg total) by mouth daily.   albuterol (VENTOLIN HFA) 108 (90 Base) MCG/ACT inhaler Inhale 2 puffs into the lungs every 6 (six) hours as needed for wheezing or shortness of breath.   amiodarone (PACERONE) 200 MG tablet Take 1 tablet (200 mg total) by mouth 2 (two) times daily.   apixaban (ELIQUIS) 5 MG TABS tablet Take 1 tablet (5 mg total) by mouth 2 (two) times daily.   atorvastatin (LIPITOR) 40 MG tablet Take 1 tablet (40 mg total) by mouth daily.   diltiazem (CARDIZEM CD) 240 MG 24 hr capsule Take 1 capsule (240 mg total) by mouth daily.   fluticasone-salmeterol (ADVAIR HFA) 45-21 MCG/ACT inhaler Inhale 2 puffs into the lungs 2 (two) times daily.   lisinopril (ZESTRIL) 5 MG tablet Take 0.5 tablets (2.5 mg total) by mouth every other day.   pantoprazole (PROTONIX) 40 MG tablet Take 1 tablet (40 mg total) by mouth daily.   [DISCONTINUED] apixaban (ELIQUIS) 5 MG TABS tablet Take 1 tablet (5 mg total) by mouth 2 (two) times daily.   No facility-administered encounter medications on file as of 11/24/2021.    Follow-up: Return in about 4 weeks (around 12/22/2021) for follow up.   Becky Sax, MD

## 2021-12-22 ENCOUNTER — Ambulatory Visit (HOSPITAL_COMMUNITY)
Admission: RE | Admit: 2021-12-22 | Discharge: 2021-12-22 | Disposition: A | Discharge status: Home / Self Care | Payer: Medicare HMO | Source: Ambulatory Visit | Attending: Physician Assistant | Admitting: Physician Assistant

## 2021-12-22 ENCOUNTER — Other Ambulatory Visit: Payer: Self-pay

## 2021-12-22 VITALS — BP 122/80 | HR 84 | Ht 72.0 in | Wt 151.6 lb

## 2021-12-22 DIAGNOSIS — D6869 Other thrombophilia: Secondary | ICD-10-CM | POA: Diagnosis not present

## 2021-12-22 DIAGNOSIS — I129 Hypertensive chronic kidney disease with stage 1 through stage 4 chronic kidney disease, or unspecified chronic kidney disease: Secondary | ICD-10-CM | POA: Insufficient documentation

## 2021-12-22 DIAGNOSIS — Z7901 Long term (current) use of anticoagulants: Secondary | ICD-10-CM | POA: Diagnosis not present

## 2021-12-22 DIAGNOSIS — Z720 Tobacco use: Secondary | ICD-10-CM | POA: Insufficient documentation

## 2021-12-22 DIAGNOSIS — R9431 Abnormal electrocardiogram [ECG] [EKG]: Secondary | ICD-10-CM | POA: Diagnosis not present

## 2021-12-22 DIAGNOSIS — Z8701 Personal history of pneumonia (recurrent): Secondary | ICD-10-CM | POA: Insufficient documentation

## 2021-12-22 DIAGNOSIS — Z79899 Other long term (current) drug therapy: Secondary | ICD-10-CM | POA: Diagnosis not present

## 2021-12-22 DIAGNOSIS — I480 Paroxysmal atrial fibrillation: Secondary | ICD-10-CM | POA: Insufficient documentation

## 2021-12-22 DIAGNOSIS — F1010 Alcohol abuse, uncomplicated: Secondary | ICD-10-CM | POA: Diagnosis not present

## 2021-12-22 DIAGNOSIS — Z9114 Patient's other noncompliance with medication regimen: Secondary | ICD-10-CM | POA: Diagnosis not present

## 2021-12-22 DIAGNOSIS — G4733 Obstructive sleep apnea (adult) (pediatric): Secondary | ICD-10-CM | POA: Insufficient documentation

## 2021-12-22 DIAGNOSIS — E785 Hyperlipidemia, unspecified: Secondary | ICD-10-CM | POA: Diagnosis not present

## 2021-12-22 DIAGNOSIS — N189 Chronic kidney disease, unspecified: Secondary | ICD-10-CM | POA: Diagnosis not present

## 2021-12-22 MED ORDER — APIXABAN 5 MG PO TABS: 5.0000 mg | ORAL_TABLET | Freq: Two times a day (BID) | ORAL | 0 refills | Status: DC

## 2021-12-22 MED ORDER — AMIODARONE HCL 200 MG PO TABS: 200.0000 mg | ORAL_TABLET | Freq: Every day | ORAL | Status: DC

## 2021-12-22 MED ORDER — ATORVASTATIN CALCIUM 40 MG PO TABS: 40.0000 mg | ORAL_TABLET | ORAL | Status: DC

## 2021-12-22 NOTE — Progress Notes (Signed)
Primary Care Physician: Dorna Mai, MD Primary Cardiologist: Dr [name] Primary Electrophysiologist: none Referring Physician: Dr Barb Merino Ronald Lowe is a 66 y.o. male with a history of HTN, HLD, CKD, COPD, tobacco abuse, alcohol abuse, atrial fibrillation who presents for consultation in the [facility].  The patient was initially diagnosed with atrial fibrillation 05/2021 in the setting of pneumonia and alcohol withdrawal. He was started on amiodarone and converted to SR. Patient is on Eliquis for a CHADS2VASC score of 2. He was seen in the ED 11/11/2021 for SOB and fever and was positive for influenza. He was also back in afib. Seen by Dr [name] on 11/19/21 and his amiodarone was resumed. The patient was only taking Eliquis once per day, importance of taking BID was discussed. Today, patient is back in SR and "feeling back to normal." He never started diltiazem and has only been taking amiodarone once daily. He denies any bleeding issues on anticoagulation. He states he has cut back his smoking to one pack weekly and cut back his alcohol use to 2 drinks daily.   Today, he denies symptoms of palpitations, chest pain, shortness of breath, orthopnea, PND, lower extremity edema, dizziness, presyncope, syncope, snoring, daytime somnolence, bleeding, or neurologic sequela. The patient is tolerating medications without difficulties and is otherwise without complaint today.    Atrial Fibrillation Risk Factors:  he does not have symptoms or diagnosis of sleep apnea. he does not have a history of rheumatic fever. he does have a history of alcohol use. The patient does not have a history of early familial atrial fibrillation or other arrhythmias.  he has a BMI of Body mass index is 20.56 kg/m.Marland Kitchen Filed Weights   12/22/21 1324  Weight: 68.8 kg    Family History  Problem Relation Age of Onset   Emphysema Father    CAD Father        CABG   Stroke Mother    CAD  Brother 77       CABG   Hypertension Brother      Atrial Fibrillation Management history:  Previous antiarrhythmic drugs: amiodarone  Previous cardioversions: none Previous ablations: none CHADS2VASC score: 2 Anticoagulation history: Eliquis   Past Medical History:  Diagnosis Date   Atrial fibrillation ([place])    Chronic kidney disease    Hyperlipidemia    Hypertension    Tobacco abuse    Past Surgical History:  Procedure Laterality Date   TONSILLECTOMY      Current Outpatient Medications  Medication Sig Dispense Refill   albuterol (VENTOLIN HFA) 108 (90 Base) MCG/ACT inhaler Inhale 2 puffs into the lungs every 6 (six) hours as needed for wheezing or shortness of breath. 18 g 1   amiodarone (PACERONE) 200 MG tablet Take 1 tablet (200 mg total) by mouth 2 (two) times daily. 90 tablet 3   apixaban (ELIQUIS) 5 MG TABS tablet Take 1 tablet (5 mg total) by mouth 2 (two) times daily. 60 tablet 0   atorvastatin (LIPITOR) 40 MG tablet Take 1 tablet (40 mg total) by mouth daily. (Patient taking differently: Take 40 mg by mouth every other day.) 90 tablet 3   fluticasone-salmeterol (ADVAIR HFA) 45-21 MCG/ACT inhaler Inhale 2 puffs into the lungs 2 (two) times daily.     lisinopril (ZESTRIL) 5 MG tablet Take 0.5 tablets (2.5 mg total) by mouth every other day. 90 tablet 0   pantoprazole (PROTONIX) 40 MG tablet Take 1 tablet (40 mg total) by  mouth daily. 90 tablet 1   diltiazem (CARDIZEM CD) 240 MG 24 hr capsule Take 1 capsule (240 mg total) by mouth daily. 90 capsule 3   TRELEGY ELLIPTA 100-62.5-25 MCG/ACT AEPB Take 1 puff by mouth daily. (Patient not taking: Reported on 12/22/2021)     No current facility-administered medications for this encounter.    No Known Allergies  Social History   Socioeconomic History   Marital status: Married    Spouse name: Not on file   Number of children: 2   Years of education: Not on file   Highest education level: Not on file  Occupational  History   Occupation: retired  Tobacco Use   Smoking status: Former    Packs/day: 1.00    Years: 50.00    Pack years: 50.00    Types: Cigarettes    Start date: 12/19/1970   Smokeless tobacco: Former  Scientific laboratory technician Use: Never used  Substance and Sexual Activity   Alcohol use: Not Currently    Alcohol/week: 42.0 standard drinks    Types: 28 Cans of beer, 14 Shots of liquor per week    Comment: did not drink yesterday, was shaky today and wife gave him one shot pta   Drug use: No   Sexual activity: Yes    Partners: Female    Birth control/protection: None  Other Topics Concern   Not on file  Social History Narrative   Lives with girlfriend.     Social Determinants of Health   Financial Resource Strain: Low Risk    Difficulty of Paying Living Expenses: Not hard at all  Food Insecurity: No Food Insecurity   Worried About Charity fundraiser in the Last Year: Never true   [place] in the Last Year: Never true  Transportation Needs: No Transportation Needs   Lack of Transportation (Medical): No   Lack of Transportation (Non-Medical): No  Physical Activity: Insufficiently Active   Days of Exercise per Week: 3 days   Minutes of Exercise per Session: 20 min  Stress: No Stress Concern Present   Feeling of Stress : Not at all  Social Connections: Moderately Isolated   Frequency of Communication with Friends and Family: Twice a week   Frequency of Social Gatherings with Friends and Family: More than three times a week   Attends Religious Services: Never   Marine scientist or Organizations: Yes   Attends Archivist Meetings: Never   Marital Status: Separated  Intimate Partner Violence: Not At Risk   Fear of Current or Ex-Partner: No   Emotionally Abused: No   Physically Abused: No   Sexually Abused: No     ROS- All systems are reviewed and negative except as per the HPI above.  Physical Exam: Vitals:   12/22/21 1324  BP: 122/80  Pulse: 84   Weight: 68.8 kg  Height: 6' (1.829 m)    GEN- The patient is a well appearing male, alert and oriented x 3 today.   Head- normocephalic, atraumatic Eyes-  Sclera clear, conjunctiva pink Ears- hearing intact Oropharynx- clear Neck- supple  Lungs- Clear to ausculation bilaterally, normal work of breathing Heart- Regular rate and rhythm, no murmurs, rubs or gallops  GI- soft, NT, ND, + BS Extremities- no clubbing, cyanosis, or edema MS- no significant deformity or atrophy Skin- no rash or lesion Psych- euthymic mood, full affect Neuro- strength and sensation are intact  Wt Readings from Last 3 Encounters:  12/22/21 68.8  kg  11/24/21 66.3 kg  11/19/21 67.1 kg    EKG today demonstrates  SR Vent. rate 84 BPM PR interval 136 ms QRS duration 88 ms QT/QTcB 396/467 ms  Echo 06/18/21 demonstrated  1. Left ventricular ejection fraction, by estimation, is 50 to 55%. The  left ventricle has low normal function. The left ventricle has no regional  wall motion abnormalities. Left ventricular diastolic parameters are  consistent with Grade I diastolic dysfunction (impaired relaxation).   2. Right ventricular systolic function is normal. The right ventricular  size is normal.   3. The mitral valve was not well visualized. Trivial mitral valve  regurgitation.   4. The aortic valve was not well visualized. Aortic valve regurgitation  is trivial.   Epic records are reviewed at length today  CHA2DS2-VASc Score = 2  The patient's score is based upon: CHF History: 0 HTN History: 1 Diabetes History: 0 Stroke History: 0 Vascular Disease History: 0 Age Score: 1 Gender Score: 0       ASSESSMENT AND PLAN: 1. Paroxysmal Atrial Fibrillation (ICD10:  I48.0) The patient's CHA2DS2-VASc score is 2, indicating a 2.2% annual risk of stroke.   Patient back in SR, no need for DCCV at this time. Continue amiodarone 200 mg daily (patient never took BID) Continue Eliquis 5 mg BID Patient never  started diltiazem.  Lifestyle modification was discussed including alcohol reduction/cessation. Also stressed importance of taking medications as prescribed. Could consider decreasing amiodarone to 100 mg daily if he continues to maintain SR.   2. Secondary Hypercoagulable State (ICD10:  D68.69) The patient is at significant risk for stroke/thromboembolism based upon his CHA2DS2-VASc Score of 2.  Continue Apixaban (Eliquis).   3. HTN Stable, no changes today.   Follow up with Dr [name] in 3 months.    [facility] [address] [place], [place] [address] [phone] 12/22/2021 1:55 PM

## 2021-12-27 ENCOUNTER — Ambulatory Visit: Payer: Medicare HMO | Admitting: Internal Medicine

## 2021-12-28 ENCOUNTER — Ambulatory Visit: Payer: Medicare HMO | Admitting: Family Medicine

## 2022-01-23 ENCOUNTER — Other Ambulatory Visit: Payer: Self-pay | Admitting: Internal Medicine

## 2022-01-23 DIAGNOSIS — J439 Emphysema, unspecified: Secondary | ICD-10-CM

## 2022-01-30 ENCOUNTER — Telehealth: Payer: Self-pay | Admitting: Student

## 2022-01-30 NOTE — Telephone Encounter (Signed)
-----   Message from Spero Geralds, MD sent at 01/18/2022  1:05 PM EST ----- Regarding: RE: ?switch Sure that's fine, no problem ----- Message ----- From: Maryjane Hurter, MD Sent: 01/17/2022   3:28 PM EST To: Spero Geralds, MD Subject: ?switch                                        Saw his partner today and he wondered if I could start following him in clinic on same day as her to make it logistically easier. Is this ok with you? Otherwise you'll end up getting both of them. Either way is a-ok with me.  Nate

## 2022-01-30 NOTE — Telephone Encounter (Signed)
Can we schedule this patient's follow up on same day as his wife's appointment in pulmonary with me Darrick Meigs)?  Thanks!

## 2022-02-01 ENCOUNTER — Other Ambulatory Visit (HOSPITAL_COMMUNITY): Payer: Self-pay

## 2022-02-02 NOTE — Telephone Encounter (Signed)
Patient has been scheduled on same day as spouse, 03/10/2022 at 3:15pm with Dr. Verlee Monte. Nothing further needed.

## 2022-02-14 ENCOUNTER — Ambulatory Visit (INDEPENDENT_AMBULATORY_CARE_PROVIDER_SITE_OTHER): Payer: Medicare HMO | Admitting: Physician Assistant

## 2022-02-14 ENCOUNTER — Encounter: Payer: Self-pay | Admitting: Physician Assistant

## 2022-02-14 ENCOUNTER — Other Ambulatory Visit: Payer: Self-pay

## 2022-02-14 VITALS — BP 179/107 | HR 80 | Temp 98.0°F | Resp 18 | Ht 72.0 in | Wt 162.0 lb

## 2022-02-14 DIAGNOSIS — I480 Paroxysmal atrial fibrillation: Secondary | ICD-10-CM | POA: Diagnosis not present

## 2022-02-14 DIAGNOSIS — I10 Essential (primary) hypertension: Secondary | ICD-10-CM

## 2022-02-14 DIAGNOSIS — F1010 Alcohol abuse, uncomplicated: Secondary | ICD-10-CM

## 2022-02-14 DIAGNOSIS — M7582 Other shoulder lesions, left shoulder: Secondary | ICD-10-CM

## 2022-02-14 DIAGNOSIS — M778 Other enthesopathies, not elsewhere classified: Secondary | ICD-10-CM

## 2022-02-14 MED ORDER — PREDNISONE 10 MG PO TABS: ORAL_TABLET | ORAL | 0 refills | Status: AC

## 2022-02-14 NOTE — Patient Instructions (Addendum)
To help with your shoulder pain, you are going to do a steroid taper.  Make sure that you rest your shoulder, use ice and Tylenol as needed.  Your blood pressure is elevated today, I encourage you to check your blood pressure at home on a daily basis, keep a written log and have available for all office visits.  Please bring this to your next office visit with Dr. Redmond Pulling for further review.  It is important that you take your medications as directed.  I hope that you feel better soon, please let us know if there is anything else we can do for you  Kennieth Rad, PA-C Physician Assistant [place] [email or web address]  Tendinitis Tendinitis is inflammation of a tendon. A tendon is a strong cord of tissue that connects muscle to bone. Tendinitis can affect any tendon, but it most commonly affects the: Shoulder tendon (rotator cuff). Ankle tendon (Achilles tendon). Elbow tendon (triceps tendon). Tendons in the wrist. What are the causes? This condition may be caused by: Overusing a tendon or muscle. This is common. Age-related wear and tear. Injury. Inflammatory conditions, such as arthritis. Certain medicines. What increases the risk? You are more likely to develop this condition if you do activities that involve the same movements over and over again (repetitive motions). What are the signs or symptoms? Symptoms of this condition may include: Pain. Tenderness. Mild swelling. Decreased range of motion. How is this diagnosed? This condition is diagnosed with a physical exam. You may also have tests, such as: Ultrasound. This uses sound waves to make an image of the inside of your body in the affected area. MRI. How is this treated? This condition may be treated by resting, icing, applying pressure (compression), and raising (elevating) the affected area above the level of your heart. This is known as RICE therapy. Treatment  may also include: Medicines to help reduce inflammation or to help reduce pain. Exercises or physical therapy to strengthen and stretch the tendon. A brace or splint. Surgery. This is rarely needed. Follow these instructions at home: If you have a splint or brace: Wear the splint or brace as told by your health care provider. Remove it only as told by your health care provider. Loosen the splint or brace if your fingers or toes tingle, become numb, or turn cold and blue. Keep the splint or brace clean. If the splint or brace is not waterproof: Do not let it get wet. Cover it with a watertight covering when you take a bath or shower. Managing pain, stiffness, and swelling If directed, put ice on the affected area. If you have a removable splint or brace, remove it as told by your health care provider. Put ice in a plastic bag. Place a towel between your skin and the bag. Leave the ice on for 20 minutes, 2-3 times a day. Move the fingers or toes of the affected limb often, if this applies. This can help to prevent stiffness and lessen swelling. If directed, raise (elevate) the affected area above the level of your heart while you are sitting or lying down. If directed, apply heat to the affected area before you exercise. Use the heat source that your health care provider recommends, such as a moist heat pack or a heating pad.   Place a towel between your skin and the heat source. Leave the heat on for 20-30 minutes. Remove the heat if your skin turns bright red. This is especially important if you are  unable to feel pain, heat, or cold. You may have a greater risk of getting burned. Driving Do not drive or use heavy machinery while taking prescription pain medicine. Ask your health care provider when it is safe to drive if you have a splint or brace on any part of your arm or leg. Activity Rest the affected area as told by your health care provider. Return to your normal activities as  told by your health care provider. Ask your health care provider what activities are safe for you. Avoid using the affected area while you are experiencing symptoms of tendinitis. Do exercises as told by your health care provider. General instructions If you have a splint, do not put pressure on any part of the splint until it is fully hardened. This may take several hours. Wear an elastic bandage or compression wrap only as told by your health care provider. Take over-the-counter and prescription medicines only as told by your health care provider. Keep all follow-up visits as told by your health care provider. This is important. Contact a health care provider if: Your symptoms do not improve. You develop new, unexplained problems, such as numbness in your hands. Summary Tendinitis is inflammation of a tendon. You are more likely to develop this condition if you do activities that involve the same movements over and over again. This condition may be treated by resting, icing, applying pressure (compression), and elevating the area above the level of your heart. This is known as RICE therapy. Avoid using the affected area while you are experiencing symptoms of tendinitis. This information is not intended to replace advice given to you by your health care provider. Make sure you discuss any questions you have with your health care provider. Document Revised: 06/12/2018 Document Reviewed: 04/25/2018 Elsevier Patient Education  [place].

## 2022-02-14 NOTE — Progress Notes (Signed)
Established Patient Office Visit  Subjective:  Patient ID: Ronald Lowe, male    DOB: [birth year]  Age: 66 y.o. MRN: [id number]  CC:  Chief Complaint  Patient presents with   Arm Pain    HPI OVA MEEGAN states that Ronald Lowe been having left upper arm and shoulder pain for the past 4 to 5 days.  States that it is painful for him to Ronald Lowe arm.  Denies numbness or tingling, states it "hurts on ball mostly".  States that Ronald Lowe was drinking alcohol 2 nights prior to start lifting weights using a dumbbell with only Ronald Lowe left arm, states that Ronald Lowe continued drinking alcohol Ronald Lowe continued using the dumbbell.  Reports that Ronald Lowe pain started 2 days later.  Reports that Ronald Lowe has tried drinking alcohol and using Advil without  States that Ronald Lowe quit checking Ronald Lowe blood pressure at home.  States that Ronald Lowe is taking 2.5 mg of lisinopril every other day, states that Ronald Lowe is taking 1 tablet of Eliquis once daily, instead of twice daily.  States that Ronald Lowe "feels better physically taking those medications like that"     Past Medical History:  Diagnosis Date   Atrial fibrillation ([place])    Chronic kidney disease    Hyperlipidemia    Hypertension    Tobacco abuse     Past Surgical History:  Procedure Laterality Date   TONSILLECTOMY      Family History  Problem Relation Age of Onset   Emphysema Father    CAD Father        CABG   Stroke Mother    CAD Brother 33       CABG   Hypertension Brother     Social History   Socioeconomic History   Marital status: Married    Spouse name: Not on file   Number of children: 2   Years of education: Not on file   Highest education level: Not on file  Occupational History   Occupation: retired  Tobacco Use   Smoking status: Former    Packs/day: 1.00    Years: 50.00    Pack years: 50.00    Types: Cigarettes    Start date: 12/19/1970   Smokeless tobacco: Former  Scientific laboratory technician Use: Never used  Substance and Sexual Activity   Alcohol use: Not Currently     Alcohol/week: 42.0 standard drinks    Types: 28 Cans of beer, 14 Shots of liquor per week    Comment: did not drink yesterday, was shaky today and wife gave him one shot pta   Drug use: No   Sexual activity: Yes    Partners: Female    Birth control/protection: None  Other Topics Concern   Not on file  Social History Narrative   Lives with girlfriend.     Social Determinants of Health   Financial Resource Strain: Low Risk    Difficulty of Paying Living Expenses: Not hard at all  Food Insecurity: No Food Insecurity   Worried About Charity fundraiser in the Last Year: Never true   [place] in the Last Year: Never true  Transportation Needs: No Transportation Needs   Lack of Transportation (Medical): No   Lack of Transportation (Non-Medical): No  Physical Activity: Insufficiently Active   Days of Exercise per Week: 3 days   Minutes of Exercise per Session: 20 min  Stress: No Stress Concern Present   Feeling of Stress : Not at all  Social Connections:  Moderately Isolated   Frequency of Communication with Friends and Family: Twice a week   Frequency of Social Gatherings with Friends and Family: More than three times a week   Attends Religious Services: Never   Marine scientist or Organizations: Yes   Attends Archivist Meetings: Never   Marital Status: Separated  Intimate Partner Violence: Not At Risk   Fear of Current or Ex-Partner: No   Emotionally Abused: No   Physically Abused: No   Sexually Abused: No    Outpatient Medications Prior to Visit  Medication Sig Dispense Refill   albuterol (VENTOLIN HFA) 108 (90 Base) MCG/ACT inhaler TAKE 2 PUFFS BY MOUTH EVERY 6 HOURS AS NEEDED FOR WHEEZE OR SHORTNESS OF BREATH 18 each 1   amiodarone (PACERONE) 200 MG tablet Take 1 tablet (200 mg total) by mouth daily.     apixaban (ELIQUIS) 5 MG TABS tablet Take 1 tablet (5 mg total) by mouth 2 (two) times daily. 180 tablet 0   atorvastatin (LIPITOR) 40 MG tablet Take  1 tablet (40 mg total) by mouth every other day.     fluticasone-salmeterol (ADVAIR HFA) 45-21 MCG/ACT inhaler Inhale 2 puffs into the lungs 2 (two) times daily.     lisinopril (ZESTRIL) 5 MG tablet Take 0.5 tablets (2.5 mg total) by mouth every other day. 90 tablet 0   pantoprazole (PROTONIX) 40 MG tablet Take 1 tablet (40 mg total) by mouth daily. 90 tablet 1   TRELEGY ELLIPTA 100-62.5-25 MCG/ACT AEPB Take 1 puff by mouth daily. (Patient not taking: Reported on 12/22/2021)     No facility-administered medications prior to visit.    No Known Allergies  ROS Review of Systems  Constitutional: Negative.   HENT: Negative.    Eyes: Negative.   Respiratory:  Negative for shortness of breath.   Cardiovascular:  Negative for chest pain.  Gastrointestinal: Negative.   Endocrine: Negative.   Genitourinary: Negative.   Musculoskeletal:  Positive for arthralgias and myalgias.  Skin: Negative.   Allergic/Immunologic: Negative.   Neurological: Negative.   Hematological: Negative.   Psychiatric/Behavioral: Negative.       Objective:    Physical Exam Vitals and nursing note reviewed.  Constitutional:      Appearance: Normal appearance.  HENT:     Head: Normocephalic and atraumatic.     Right Ear: External ear normal.     Left Ear: External ear normal.     Nose: Nose normal.     Mouth/Throat:     Mouth: Mucous membranes are moist.     Pharynx: Oropharynx is clear.  Eyes:     Extraocular Movements: Extraocular movements intact.     Conjunctiva/sclera: Conjunctivae normal.     Pupils: Pupils are equal, round, and reactive to light.  Cardiovascular:     Rate and Rhythm: Normal rate and regular rhythm.     Pulses: Normal pulses.     Heart sounds: Normal heart sounds.  Pulmonary:     Effort: Pulmonary effort is normal.     Breath sounds: Normal breath sounds.  Musculoskeletal:     Right shoulder: Normal.     Left shoulder: Tenderness present. No swelling. Decreased range of motion.  Decreased strength. Normal pulse.     Right upper arm: Normal.     Left upper arm: Tenderness present. No swelling.     Right elbow: Normal.     Left elbow: Normal.     Cervical back: Normal range of motion and neck supple.  Skin:    General: Skin is warm and dry.  Neurological:     General: No focal deficit present.     Mental Status: Ronald Lowe is alert and oriented to person, place, and time.  Psychiatric:        Mood and Affect: Mood normal.        Behavior: Behavior normal.        Thought Content: Thought content normal.        Judgment: Judgment normal.    BP (!) 179/107 (BP Location: Left Arm, Patient Position: Sitting, Cuff Size: Normal)    Pulse 80    Temp 98 F (36.7 C) (Oral)    Resp 18    Ht 6' (1.829 m)    Wt 162 lb (73.5 kg)    SpO2 97%    BMI 21.97 kg/m  Wt Readings from Last 3 Encounters:  02/14/22 162 lb (73.5 kg)  12/22/21 151 lb 9.6 oz (68.8 kg)  11/24/21 146 lb 3.2 oz (66.3 kg)     Health Maintenance Due  Topic Date Due   Zoster Vaccines- Shingrix (1 of 2) Never done   COVID-19 Vaccine (3 - Pfizer risk series) 05/08/2020   Pneumonia Vaccine 64+ Years old (2 - PPSV23 if available, else PCV20) 09/23/2020   INFLUENZA VACCINE  Never done    There are no preventive care reminders to display for this patient.  Lab Results  Component Value Date   TSH 1.009 06/18/2021   Lab Results  Component Value Date   WBC 6.6 11/17/2021   HGB 15.8 11/17/2021   HCT 45.4 11/17/2021   MCV 98.7 11/17/2021   PLT 217 11/17/2021   Lab Results  Component Value Date   NA 133 (L) 11/17/2021   K 3.9 11/17/2021   CO2 22 11/17/2021   GLUCOSE 135 (H) 11/17/2021   BUN 15 11/17/2021   CREATININE 1.23 11/17/2021   BILITOT 0.9 11/17/2021   ALKPHOS 50 11/17/2021   AST 43 (H) 11/17/2021   ALT 25 11/17/2021   PROT 6.9 11/17/2021   ALBUMIN 3.4 (L) 11/17/2021   CALCIUM 8.5 (L) 11/17/2021   ANIONGAP 12 11/17/2021   Lab Results  Component Value Date   CHOL 154 03/24/2021   Lab  Results  Component Value Date   HDL 74 03/24/2021   Lab Results  Component Value Date   LDLCALC 66 03/24/2021   Lab Results  Component Value Date   TRIG 74 03/24/2021   Lab Results  Component Value Date   CHOLHDL 2.1 03/24/2021   Lab Results  Component Value Date   HGBA1C 5.0 06/14/2021      Assessment & Plan:   Problem List Items Addressed This Visit       Cardiovascular and Mediastinum   Elevated blood pressure reading in office with diagnosis of hypertension   Paroxysmal atrial fibrillation (HCC)     Other   Alcohol use disorder, mild, abuse   Other Visit Diagnoses     Tendinitis of left shoulder    -  Primary   Relevant Medications   predniSONE (DELTASONE) 10 MG tablet       Meds ordered this encounter  Medications   predniSONE (DELTASONE) 10 MG tablet    Sig: Take 4 tablets (40 mg total) by mouth daily with breakfast for 2 days, THEN 3 tablets (30 mg total) daily with breakfast for 2 days, THEN 2 tablets (20 mg total) daily with breakfast for 2 days, THEN 1 tablet (10 mg total) daily  with breakfast for 2 days.    Dispense:  20 tablet    Refill:  0    Order Specific Question:   Supervising Provider    Answer:   WRIGHT, PATRICK E [[id number]]   1. Tendinitis of left shoulder Patient strongly encouraged to discontinue any use of NSAIDs.  Patient understands and agrees.  Trial prednisone taper, patient education given on supportive care.  Red flags given for prompt reevaluation. - predniSONE (DELTASONE) 10 MG tablet; Take 4 tablets (40 mg total) by mouth daily with breakfast for 2 days, THEN 3 tablets (30 mg total) daily with breakfast for 2 days, THEN 2 tablets (20 mg total) daily with breakfast for 2 days, THEN 1 tablet (10 mg total) daily with breakfast for 2 days.  Dispense: 20 tablet; Refill: 0  2. Elevated blood pressure reading in office with diagnosis of hypertension Patient encouraged to restart checking blood pressure at home on a daily basis, keep a  written log and have available for all office visits.  Patient did have visit with the atrial fibrillation clinic on December 22, 2021, Ronald Lowe blood pressure during that visit was 122/80  3. Essential hypertension   4. Paroxysmal atrial fibrillation (HCC) Reviewed medications with patient, strongly encourage compliance to prescribed medication regimen.  Reviewed the last office visit with the atrial fibrillation clinic with patient as well.   ASSESSMENT AND PLAN: 1. Paroxysmal Atrial Fibrillation (ICD10:  I48.0) The patient's CHA2DS2-VASc score is 2, indicating a 2.2% annual risk of stroke.   Patient back in SR, no need for DCCV at this time. Continue amiodarone 200 mg daily (patient never took BID) Continue Eliquis 5 mg BID Patient never started diltiazem.  Lifestyle modification was discussed including alcohol reduction/cessation. Also stressed importance of taking medications as prescribed. Could consider decreasing amiodarone to 100 mg daily if Ronald Lowe continues to maintain SR.    2. Secondary Hypercoagulable State (ICD10:  D68.69) The patient is at significant risk for stroke/thromboembolism based upon Ronald Lowe CHA2DS2-VASc Score of 2.  Continue Apixaban (Eliquis).    3. HTN Stable, no changes today.     Follow up with Dr [name] in 3 months.   5. Alcohol use disorder, mild, abuse Patient strongly encouraged to discontinue alcohol use, encouraged to substance abuse counseling, patient declines at this time   I have reviewed the patient's medical history (PMH, PSH, Social History, Family History, Medications, and allergies) , and have been updated if relevant. I spent 30 minutes reviewing chart and  face to face time with patient.     Follow-up: Return in about 4 weeks (around 03/14/2022) for with Dr. Redmond Pulling at [facility] - BP review.    Loraine Grip Mayers, PA-C

## 2022-02-18 ENCOUNTER — Other Ambulatory Visit: Payer: Self-pay | Admitting: Physician Assistant

## 2022-02-18 DIAGNOSIS — M778 Other enthesopathies, not elsewhere classified: Secondary | ICD-10-CM

## 2022-03-10 ENCOUNTER — Ambulatory Visit: Payer: Medicare HMO | Admitting: Student

## 2022-03-10 NOTE — Progress Notes (Deleted)
? ?Synopsis: Referred for *** by Dorna Mai, MD ? ?Subjective:  ? ?PATIENT ID: Ronald Lowe GENDER: male DOB: May 01, 1956, MRN: [id number] ? ?No chief complaint on file. ? ? ? ? ?*** ? ?Otherwise pertinent review of systems is negative. ? ?Past Medical History:  ?Diagnosis Date  ? Atrial fibrillation ([place])   ? Chronic kidney disease   ? Hyperlipidemia   ? Hypertension   ? Tobacco abuse   ?  ? ?Family History  ?Problem Relation Age of Onset  ? Emphysema Father   ? CAD Father   ?     CABG  ? Stroke Mother   ? CAD Brother 75  ?     CABG  ? Hypertension Brother   ?  ? ?Past Surgical History:  ?Procedure Laterality Date  ? TONSILLECTOMY    ? ? ?Social History  ? ?Socioeconomic History  ? Marital status: Married  ?  Spouse name: Not on file  ? Number of children: 2  ? Years of education: Not on file  ? Highest education level: Not on file  ?Occupational History  ? Occupation: retired  ?Tobacco Use  ? Smoking status: Former  ?  Packs/day: 1.00  ?  Years: 50.00  ?  Pack years: 50.00  ?  Types: Cigarettes  ?  Start date: 12/19/1970  ? Smokeless tobacco: Former  ?Vaping Use  ? Vaping Use: Never used  ?Substance and Sexual Activity  ? Alcohol use: Not Currently  ?  Alcohol/week: 42.0 standard drinks  ?  Types: 28 Cans of beer, 14 Shots of liquor per week  ?  Comment: did not drink yesterday, was shaky today and wife gave him one shot pta  ? Drug use: No  ? Sexual activity: Yes  ?  Partners: Female  ?  Birth control/protection: None  ?Other Topics Concern  ? Not on file  ?Social History Narrative  ? Lives with girlfriend.    ? ?Social Determinants of Health  ? ?Financial Resource Strain: Low Risk   ? Difficulty of Paying Living Expenses: Not hard at all  ?Food Insecurity: No Food Insecurity  ? Worried About Charity fundraiser in the Last Year: Never true  ? Ran Out of Food in the Last Year: Never true  ?Transportation Needs: No Transportation Needs  ? Lack of Transportation (Medical): No  ? Lack of Transportation  (Non-Medical): No  ?Physical Activity: Insufficiently Active  ? Days of Exercise per Week: 3 days  ? Minutes of Exercise per Session: 20 min  ?Stress: No Stress Concern Present  ? Feeling of Stress : Not at all  ?Social Connections: Moderately Isolated  ? Frequency of Communication with Friends and Family: Twice a week  ? Frequency of Social Gatherings with Friends and Family: More than three times a week  ? Attends Religious Services: Never  ? Active Member of Clubs or Organizations: Yes  ? Attends Archivist Meetings: Never  ? Marital Status: Separated  ?Intimate Partner Violence: Not At Risk  ? Fear of Current or Ex-Partner: No  ? Emotionally Abused: No  ? Physically Abused: No  ? Sexually Abused: No  ?  ? ?No Known Allergies  ? ?Outpatient Medications Prior to Visit  ?Medication Sig Dispense Refill  ? albuterol (VENTOLIN HFA) 108 (90 Base) MCG/ACT inhaler TAKE 2 PUFFS BY MOUTH EVERY 6 HOURS AS NEEDED FOR WHEEZE OR SHORTNESS OF BREATH 18 each 1  ? amiodarone (PACERONE) 200 MG tablet Take 1 tablet (200 mg  total) by mouth daily.    ? apixaban (ELIQUIS) 5 MG TABS tablet Take 1 tablet (5 mg total) by mouth 2 (two) times daily. 180 tablet 0  ? atorvastatin (LIPITOR) 40 MG tablet Take 1 tablet (40 mg total) by mouth every other day.    ? fluticasone-salmeterol (ADVAIR HFA) 45-21 MCG/ACT inhaler Inhale 2 puffs into the lungs 2 (two) times daily.    ? lisinopril (ZESTRIL) 5 MG tablet Take 0.5 tablets (2.5 mg total) by mouth every other day. 90 tablet 0  ? pantoprazole (PROTONIX) 40 MG tablet Take 1 tablet (40 mg total) by mouth daily. 90 tablet 1  ? TRELEGY ELLIPTA 100-62.5-25 MCG/ACT AEPB Take 1 puff by mouth daily. (Patient not taking: Reported on 12/22/2021)    ? ?No facility-administered medications prior to visit.  ? ? ? ? ? ?Objective:  ? ?Physical Exam: ? ?General appearance: 66 y.o., male, NAD, conversant  ?Eyes: anicteric sclerae; PERRL, tracking appropriately ?HENT: NCAT; MMM ?Neck: Trachea midline; no  lymphadenopathy, no JVD ?Lungs: CTAB, no crackles, no wheeze, with normal respiratory effort ?CV: RRR, no murmur  ?Abdomen: Soft, non-tender; non-distended, BS present  ?Extremities: No peripheral edema, warm ?Skin: Normal turgor and texture; no rash ?Psych: Appropriate affect ?Neuro: Alert and oriented to person and place, no focal deficit  ? ? ? ?There were no vitals filed for this visit. ?  on *** LPM *** RA ?BMI Readings from Last 3 Encounters:  ?02/14/22 21.97 kg/m?  ?12/22/21 20.56 kg/m?  ?11/24/21 19.83 kg/m?  ? ?Wt Readings from Last 3 Encounters:  ?02/14/22 162 lb (73.5 kg)  ?12/22/21 151 lb 9.6 oz (68.8 kg)  ?11/24/21 146 lb 3.2 oz (66.3 kg)  ? ? ? ?CBC ?   ?Component Value Date/Time  ? WBC 6.6 11/17/2021 0030  ? RBC 4.60 11/17/2021 0030  ? HGB 15.8 11/17/2021 0030  ? HGB 17.0 09/24/2019 1144  ? HCT 45.4 11/17/2021 0030  ? HCT 48.4 09/24/2019 1144  ? PLT 217 11/17/2021 0030  ? PLT 274 09/24/2019 1144  ? MCV 98.7 11/17/2021 0030  ? MCV 101 (H) 09/24/2019 1144  ? MCH 34.3 (H) 11/17/2021 0030  ? MCHC 34.8 11/17/2021 0030  ? RDW 13.4 11/17/2021 0030  ? RDW 12.8 09/24/2019 1144  ? LYMPHSABS 0.8 11/17/2021 0030  ? MONOABS 0.7 11/17/2021 0030  ? EOSABS 0.0 11/17/2021 0030  ? BASOSABS 0.0 11/17/2021 0030  ? ? ?*** ? ?Chest Imaging: ?*** ? ?Pulmonary Functions Testing Results: ?   ? View : No data to display.  ?  ?  ?  ? ? ?FeNO: *** ? ?Pathology: *** ? ?Echocardiogram: *** ? ?Heart Catheterization: *** ?   ?Assessment & Plan:  ? ? ?Plan: ? ?.cess ? ? ? ?Maryjane Hurter, MD ?[facility] ?03/10/2022 3:06 PM  ? ?

## 2022-03-14 ENCOUNTER — Encounter: Payer: Self-pay | Admitting: Family Medicine

## 2022-03-14 ENCOUNTER — Other Ambulatory Visit: Payer: Self-pay

## 2022-03-14 ENCOUNTER — Ambulatory Visit (INDEPENDENT_AMBULATORY_CARE_PROVIDER_SITE_OTHER): Payer: Medicare HMO | Admitting: Family Medicine

## 2022-03-14 VITALS — BP 143/90 | HR 84 | Temp 98.0°F | Resp 16 | Wt 156.8 lb

## 2022-03-14 DIAGNOSIS — I10 Essential (primary) hypertension: Secondary | ICD-10-CM | POA: Diagnosis not present

## 2022-03-14 DIAGNOSIS — F341 Dysthymic disorder: Secondary | ICD-10-CM

## 2022-03-14 DIAGNOSIS — M79602 Pain in left arm: Secondary | ICD-10-CM | POA: Diagnosis not present

## 2022-03-14 MED ORDER — LISINOPRIL 5 MG PO TABS: 5.0000 mg | ORAL_TABLET | ORAL | 0 refills | Status: DC

## 2022-03-14 NOTE — Progress Notes (Signed)
? ?Established Patient Office Visit ? ?Subjective:  ?Patient ID: Ronald Lowe, male    DOB: [birth year]  Age: 66 y.o. MRN: [id number] ? ?CC:  ?Chief Complaint  ?Patient presents with  ? Follow-up  ? Hypertension  ? ? ?HPI ?Ronald Lowe presents for follow up of hypertension. Patient also reports that for the last month he has not been able to use his left arm. He denies known trauma or injur but does reports lifting heavy weights and does not know if that contributed.  ? ?Past Medical History:  ?Diagnosis Date  ? Atrial fibrillation ([place])   ? Chronic kidney disease   ? Hyperlipidemia   ? Hypertension   ? Tobacco abuse   ? ? ?Past Surgical History:  ?Procedure Laterality Date  ? TONSILLECTOMY    ? ? ?Family History  ?Problem Relation Age of Onset  ? Emphysema Father   ? CAD Father   ?     CABG  ? Stroke Mother   ? CAD Brother 32  ?     CABG  ? Hypertension Brother   ? ? ?Social History  ? ?Socioeconomic History  ? Marital status: Married  ?  Spouse name: Not on file  ? Number of children: 2  ? Years of education: Not on file  ? Highest education level: Not on file  ?Occupational History  ? Occupation: retired  ?Tobacco Use  ? Smoking status: Former  ?  Packs/day: 1.00  ?  Years: 50.00  ?  Pack years: 50.00  ?  Types: Cigarettes  ?  Start date: 12/19/1970  ? Smokeless tobacco: Former  ?Vaping Use  ? Vaping Use: Never used  ?Substance and Sexual Activity  ? Alcohol use: Not Currently  ?  Alcohol/week: 42.0 standard drinks  ?  Types: 28 Cans of beer, 14 Shots of liquor per week  ?  Comment: did not drink yesterday, was shaky today and wife gave him one shot pta  ? Drug use: No  ? Sexual activity: Yes  ?  Partners: Female  ?  Birth control/protection: None  ?Other Topics Concern  ? Not on file  ?Social History Narrative  ? Lives with girlfriend.    ? ?Social Determinants of Health  ? ?Financial Resource Strain: Low Risk   ? Difficulty of Paying Living Expenses: Not hard at all  ?Food Insecurity: No Food Insecurity  ?  Worried About Charity fundraiser in the Last Year: Never true  ? Ran Out of Food in the Last Year: Never true  ?Transportation Needs: No Transportation Needs  ? Lack of Transportation (Medical): No  ? Lack of Transportation (Non-Medical): No  ?Physical Activity: Insufficiently Active  ? Days of Exercise per Week: 3 days  ? Minutes of Exercise per Session: 20 min  ?Stress: No Stress Concern Present  ? Feeling of Stress : Not at all  ?Social Connections: Moderately Isolated  ? Frequency of Communication with Friends and Family: Twice a week  ? Frequency of Social Gatherings with Friends and Family: More than three times a week  ? Attends Religious Services: Never  ? Active Member of Clubs or Organizations: Yes  ? Attends Archivist Meetings: Never  ? Marital Status: Separated  ?Intimate Partner Violence: Not At Risk  ? Fear of Current or Ex-Partner: No  ? Emotionally Abused: No  ? Physically Abused: No  ? Sexually Abused: No  ? ? ?ROS ?Review of Systems  ?Psychiatric/Behavioral:  Negative for self-injury and  suicidal ideas. The patient is not nervous/anxious.   ?All other systems reviewed and are negative. ? ?Objective:  ? ?Today's Vitals: BP (!) 143/90   Pulse 84   Temp 98 ?F (36.7 ?C) (Oral)   Resp 16   Wt 156 lb 12.8 oz (71.1 kg)   SpO2 94%   BMI 21.27 kg/m?  ? ?Physical Exam ?Vitals and nursing note reviewed.  ?Constitutional:   ?   General: He is not in acute distress. ?Cardiovascular:  ?   Rate and Rhythm: Normal rate and regular rhythm.  ?Pulmonary:  ?   Effort: Pulmonary effort is normal.  ?   Breath sounds: Normal breath sounds.  ?Abdominal:  ?   Palpations: Abdomen is soft.  ?   Tenderness: There is no abdominal tenderness.  ?Musculoskeletal:  ?   Left forearm: No deformity or tenderness.  ?   Right lower leg: No edema.  ?   Left lower leg: No edema.  ?   Comments: Unable to actively move forearm from the elbow distally  ?Neurological:  ?   General: No focal deficit present.  ?   Mental  Status: He is alert and oriented to person, place, and time.  ?Psychiatric:     ?   Mood and Affect: Mood normal.     ?   Speech: Speech normal.     ?   Behavior: Behavior normal. Behavior is cooperative.  ? ? ?Assessment & Plan:  ? ?1. Essential hypertension ?Discussed compliance. Slightly elevated readings. Will refill meds and monitor ? ?2. Left arm pain ?Referral to ortho for further eval/mgt ?- Ambulatory referral to Orthopedic Surgery ? ?3. Dysthymia ?Patient defers any eval/mgt at this time ? ? ?Outpatient Encounter Medications as of 03/14/2022  ?Medication Sig  ? albuterol (VENTOLIN HFA) 108 (90 Base) MCG/ACT inhaler TAKE 2 PUFFS BY MOUTH EVERY 6 HOURS AS NEEDED FOR WHEEZE OR SHORTNESS OF BREATH  ? amiodarone (PACERONE) 200 MG tablet Take 1 tablet (200 mg total) by mouth daily.  ? apixaban (ELIQUIS) 5 MG TABS tablet Take 1 tablet (5 mg total) by mouth 2 (two) times daily.  ? atorvastatin (LIPITOR) 40 MG tablet Take 1 tablet (40 mg total) by mouth every other day.  ? fluticasone-salmeterol (ADVAIR HFA) 45-21 MCG/ACT inhaler Inhale 2 puffs into the lungs 2 (two) times daily.  ? lisinopril (ZESTRIL) 5 MG tablet Take 0.5 tablets (2.5 mg total) by mouth every other day.  ? pantoprazole (PROTONIX) 40 MG tablet Take 1 tablet (40 mg total) by mouth daily.  ? TRELEGY ELLIPTA 100-62.5-25 MCG/ACT AEPB Take 1 puff by mouth daily. (Patient not taking: Reported on 12/22/2021)  ? ?No facility-administered encounter medications on file as of 03/14/2022.  ? ? ?Follow-up: No follow-ups on file.  ? ?Becky Sax, MD ? ?

## 2022-03-14 NOTE — Progress Notes (Signed)
Patient is here for follow-up HTN. Patient is doing much better today. Patient is still c/o having pain in his left arm from lifting weights. ?

## 2022-03-15 ENCOUNTER — Encounter: Payer: Self-pay | Admitting: Family Medicine

## 2022-03-16 ENCOUNTER — Ambulatory Visit (INDEPENDENT_AMBULATORY_CARE_PROVIDER_SITE_OTHER): Payer: Medicare HMO

## 2022-03-16 ENCOUNTER — Ambulatory Visit (INDEPENDENT_AMBULATORY_CARE_PROVIDER_SITE_OTHER): Payer: Medicare HMO | Admitting: Orthopedic Surgery

## 2022-03-16 DIAGNOSIS — M542 Cervicalgia: Secondary | ICD-10-CM | POA: Diagnosis not present

## 2022-03-16 DIAGNOSIS — M25512 Pain in left shoulder: Secondary | ICD-10-CM

## 2022-03-16 DIAGNOSIS — R29898 Other symptoms and signs involving the musculoskeletal system: Secondary | ICD-10-CM | POA: Diagnosis not present

## 2022-03-18 ENCOUNTER — Encounter: Payer: Self-pay | Admitting: Orthopedic Surgery

## 2022-03-18 ENCOUNTER — Ambulatory Visit
Admission: RE | Admit: 2022-03-18 | Discharge: 2022-03-18 | Disposition: A | Discharge status: Home / Self Care | Payer: Medicare HMO | Source: Ambulatory Visit | Attending: Orthopedic Surgery | Admitting: Orthopedic Surgery

## 2022-03-18 DIAGNOSIS — M4802 Spinal stenosis, cervical region: Secondary | ICD-10-CM | POA: Diagnosis not present

## 2022-03-18 DIAGNOSIS — M542 Cervicalgia: Secondary | ICD-10-CM

## 2022-03-18 DIAGNOSIS — M2578 Osteophyte, vertebrae: Secondary | ICD-10-CM | POA: Diagnosis not present

## 2022-03-18 IMAGING — MR MR CERVICAL SPINE W/O CM
5 series · 29 of 48 positions shown · non-contrast
Comparison: Cervical radiographs [DATE] without report.

CLINICAL DATA: MR cervical spine eval for source of left upper
extremity weakness

EXAM:
MRI CERVICAL SPINE WITHOUT CONTRAST
TECHNIQUE: Multiplanar, multisequence MR imaging of the cervical spine was
performed. No intravenous contrast was administered.

[Series 2: T2 · sagittal · 3.0mm · 0.82mm/px · 6 of 21 slices shown (1 of 2)]
[im 1/21]
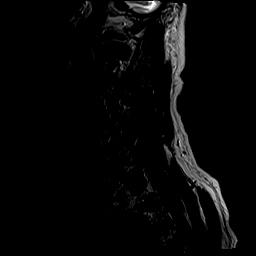
[im 5/21]
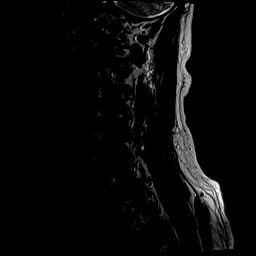
[im 9/21]
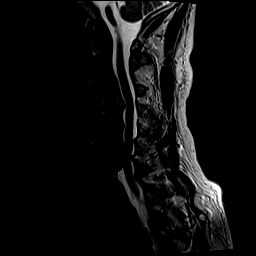
[im 13/21]
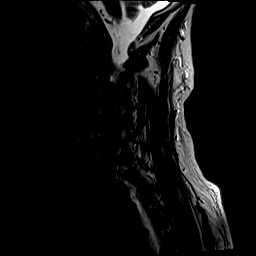
[im 17/21]
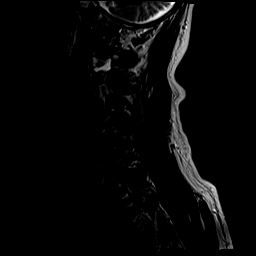
[im 21/21]
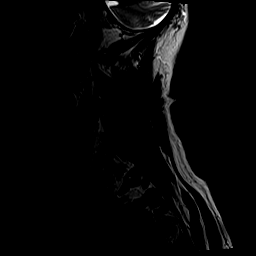

[Series 3: T1 · sagittal · 3.0mm · 0.41mm/px · 6 of 21 slices shown]
[im 1/21]
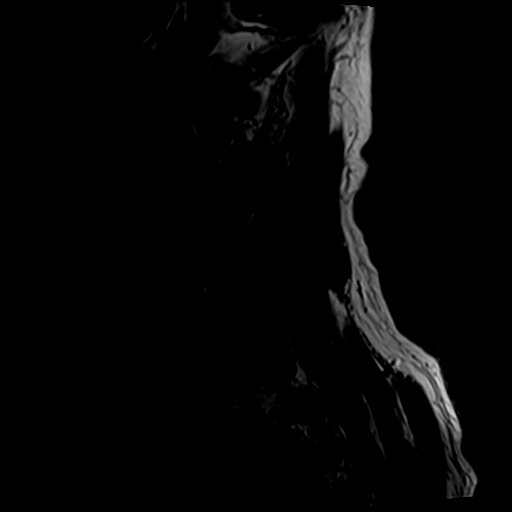
[im 5/21]
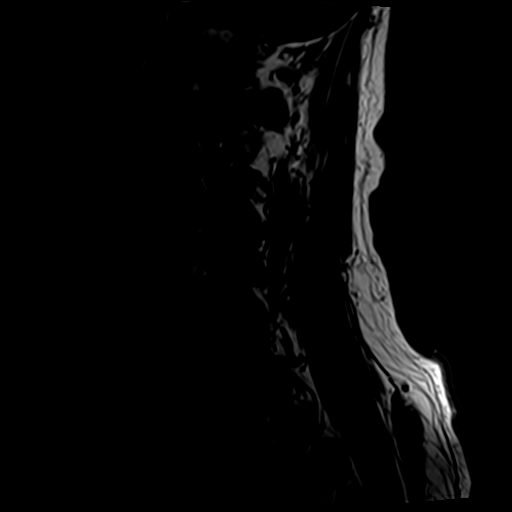
[im 9/21]
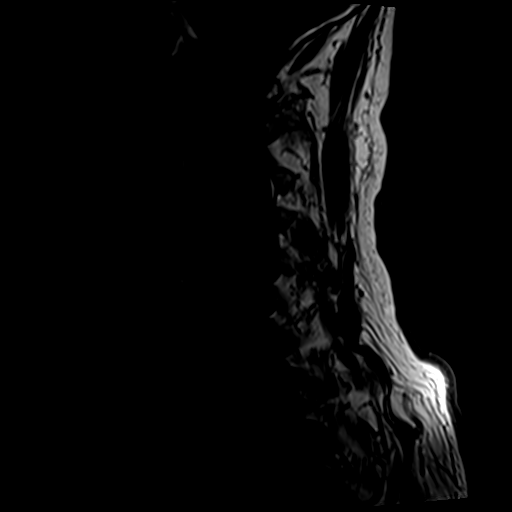
[im 13/21]
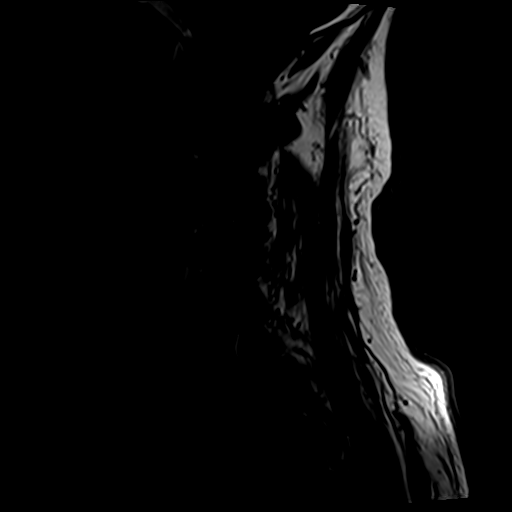
[im 17/21]
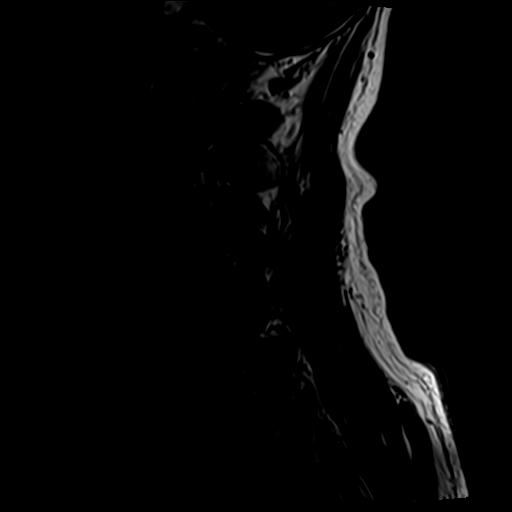
[im 21/21]
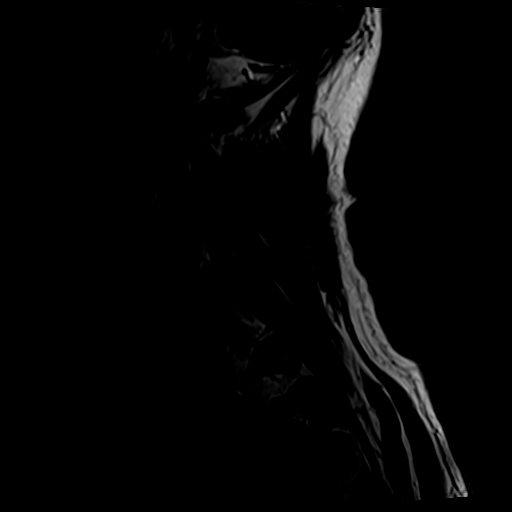

[Series 4: tir sag · sagittal · 3.0mm · 0.43mm/px · 6 of 21 slices shown]
[im 1/21]
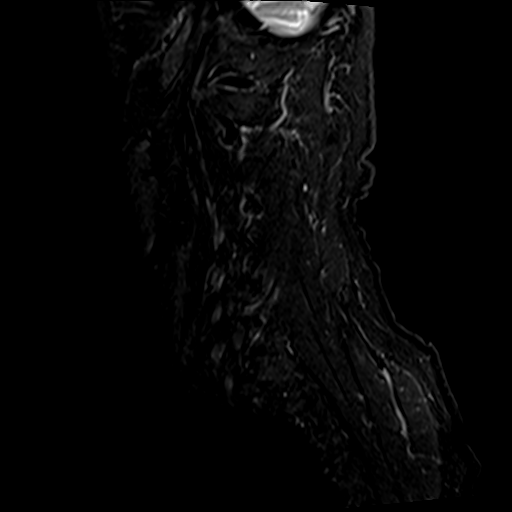
[im 5/21]
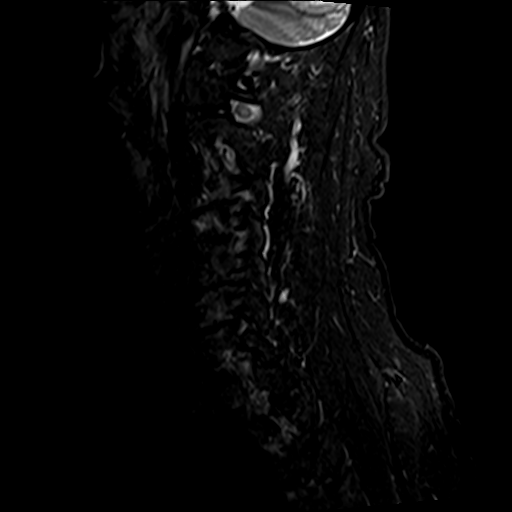
[im 9/21]
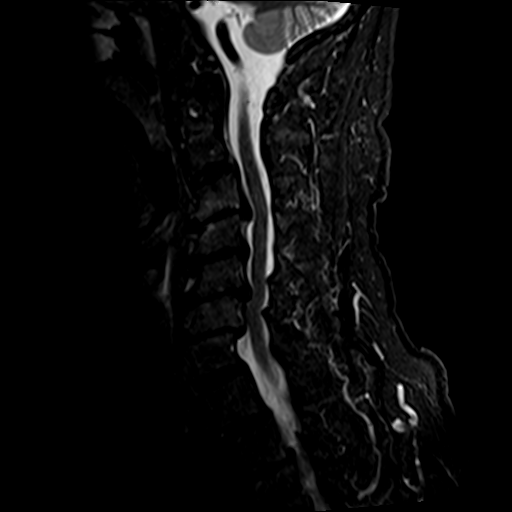
[im 13/21]
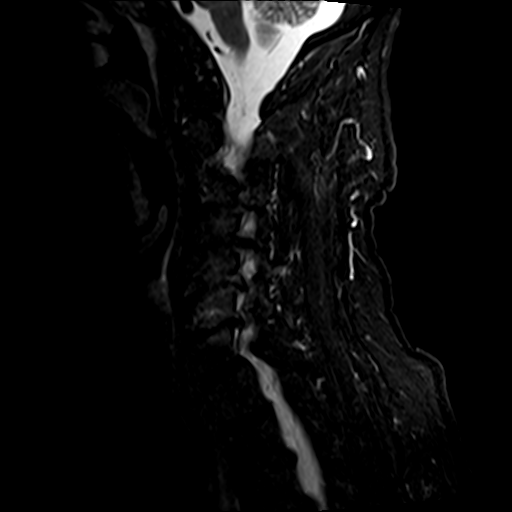
[im 17/21]
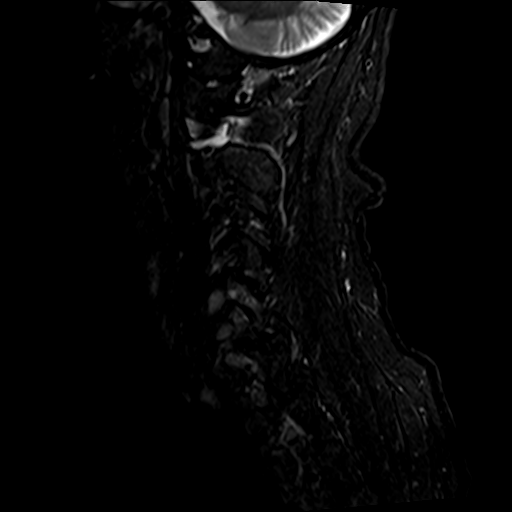
[im 21/21]
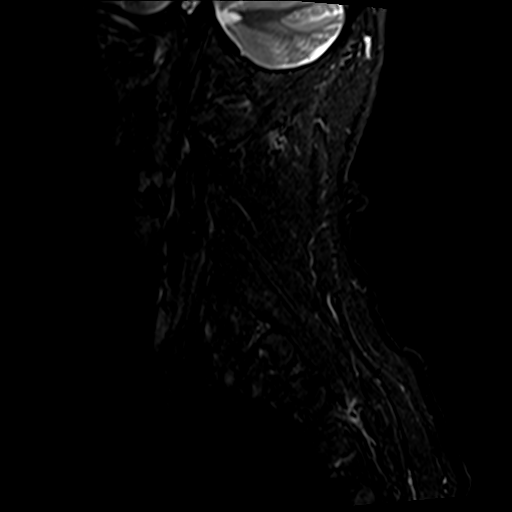

[Series 5: GRE · axial · 3.0mm · 0.39mm/px · z∈[-130,-104]mm · 2 of 53 slices shown]
[im 1/53]
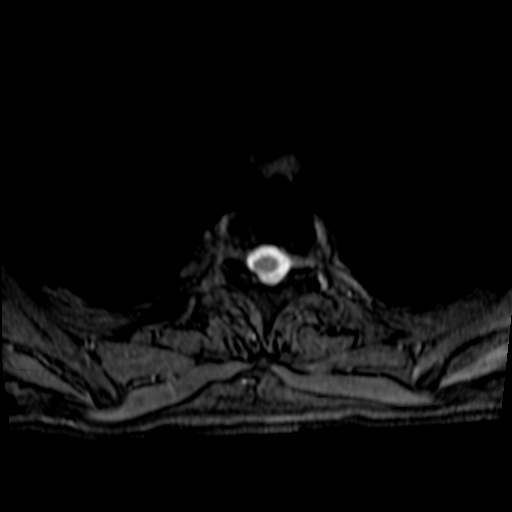
[im 8/53]
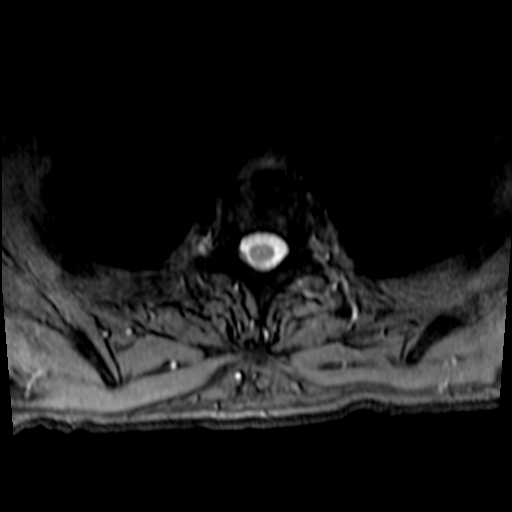

[Series 6: T2 · axial · 3.0mm · 0.78mm/px · z∈[-130,+65]mm · 9 of 53 slices shown (2 of 2)]
[im 1/53]
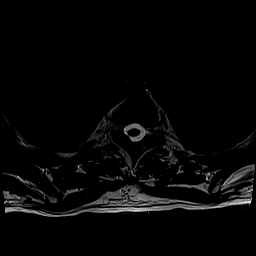
[im 8/53]
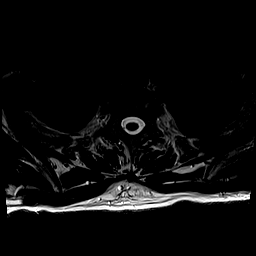
[im 15/53]
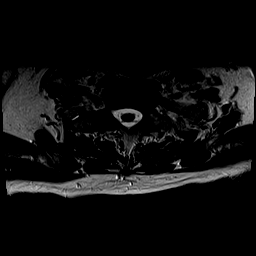
[im 23/53]
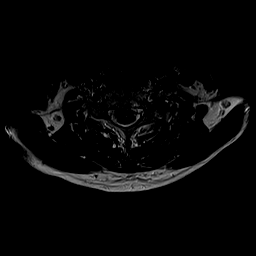
[im 27/53]
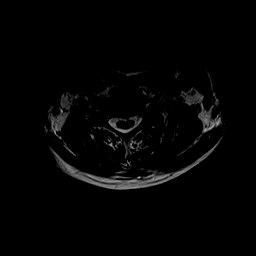
[im 30/53]
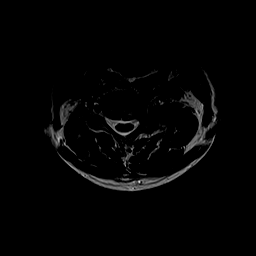
[im 38/53]
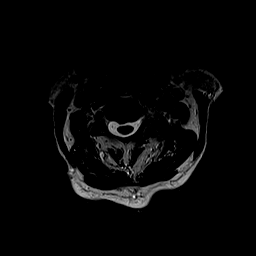
[im 45/53]
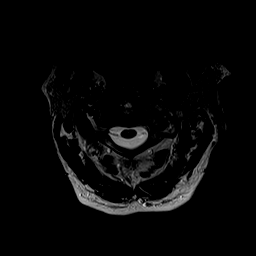
[im 53/53]
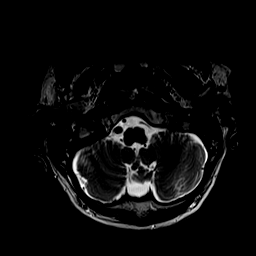

[29 of 48 positions shown; findings below may reference images not displayed]

FINDINGS: Alignment: Reversal of the normal cervical lordosis. Retrolisthesis
of C4 on C5, C5 on C6 and C6 on C7.

Vertebrae: Degenerative/discogenic endplate signal changes at
multiple levels, greatest at C3-C4 and C6-C7. No specific evidence
of acute fracture or discitis/osteomyelitis. No suspicious bone
lesions. Left C1-C2 facet joint effusion.

Cord: Normal cord signal.

Posterior Fossa, vertebral arteries, paraspinal tissues: Visualized
vertebral artery flow voids are maintained in no evidence of acute
abnormality in the visualized lower posterior fossa.

Disc levels:

C2-C3: Posterior disc/osteophyte complex. Left greater than right
facet and uncovertebral hypertrophy. Mild left foraminal stenosis
without significant canal or right foraminal stenosis.

C3-C4: Posterior disc osteophyte complex with bilateral facet and
uncovertebral hypertrophy. Resulting severe left greater than right
foraminal stenosis with mild to moderate canal stenosis and
flattening of the right ventral cord.

C4-C5: Posterior disc osteophyte complex with left greater than
right facet and uncovertebral hypertrophy. Resulting severe left and
mild-to-moderate right foraminal stenosis. Mild canal stenosis.

C5-C6: Mild posterior disc osteophyte complex with bilateral facet
and uncovertebral hypertrophy. Resulting severe bilateral foraminal
stenosis with moderate canal stenosis.

C6-C7: Posterior disc osteophyte complex with bilateral facet and
uncovertebral hypertrophy. Resulting severe bilateral foraminal
stenosis with moderate canal stenosis.

C7-T1: Posterior disc osteophyte complex with left greater than
right facet and uncovertebral hypertrophy. Resulting moderate left
foraminal stenosis without significant canal stenosis.
IMPRESSION: 1. Severe foraminal stenosis bilaterally at C3-C4, C5-C6 and C6-C7
and on the left at C4-C5. Milder multilevel foraminal stenosis is
detailed above.
2. Moderate canal stenosis at C5-C6 and C6-C7 and mild-to-moderate
canal stenosis at C3-C4.

## 2022-03-18 NOTE — Progress Notes (Signed)
Hi Mark.  This patient has essentially flaccid deltoid and flaccid biceps.  No injury to the shoulder.  Subscap strength is good.  I think this is coming from his neck.  Continue review the MRI scan and see what you think.  Is a something you would feel comfortable dealing with.  He has EMG study with new 10 on April 18 but I do not think he should necessarily wait that long because of arm on the left the way it is

## 2022-03-18 NOTE — Progress Notes (Signed)
? ?Office Visit Note ?  ?Patient: Ronald Lowe           ?Date of Birth: 02-Mar-1956           ?MRN: [id number] ?Visit Date: 03/16/2022 ?Requested by: Dorna Mai, MD ?[phone] ?[place],  [place] [address] ?PCP: Dorna Mai, MD ? ?Subjective: ?Chief Complaint  ?Patient presents with  ? Left Shoulder - Pain  ? ? ?HPI: [name] is a 66 year old patient with left shoulder and arm pain and weakness.  He was doing sets of dumbbell lifts 5 weeks ago.  He is right-hand dominant.  Now he describes significant weakness in that left arm and shoulder region with occasional radiating pains.  Denies any neck pain or any numbness and tingling.  The pain will wake him from sleep at night.  He is retired.  Used to do sign work.  Takes Tylenol at night for symptoms. ?             ?ROS: All systems reviewed are negative as they relate to the chief complaint within the history of present illness.  Patient denies  fevers or chills. ? ? ?Assessment & Plan: ?Visit Diagnoses:  ?1. Left arm weakness   ?2. Left shoulder pain, unspecified chronicity   ?3. Neck pain   ? ? ?Plan: Impression is left shoulder and arm pain with absent deltoid and bicep strength.  Triceps and subscap are working.  Motor or sensory function to the hand is intact.  His loss of deltoid musculature is profound compared to the right-hand side.  Neck range of motion is full.  This could represent nerve compression from the cervical spine versus some type of Parsonage-Turner syndrome.  Plan is MRI cervical spine with nerve conduction study with Dr. Ernestina Patches for more full evaluation.  Follow-up after those studies. ? ?Follow-Up Instructions: Return for after MRI.  ? ?Orders:  ?Orders Placed This Encounter  ?Procedures  ? XR Shoulder Left  ? XR Cervical Spine 2 or 3 views  ? MR Cervical Spine w/o contrast  ? Ambulatory referral to Physical Medicine Rehab  ? ?No orders of the defined types were placed in this encounter. ? ? ? ? Procedures: ?No procedures  performed ? ? ?Clinical Data: ?No additional findings. ? ?Objective: ?Vital Signs: There were no vitals taken for this visit. ? ?Physical Exam:  ? ?Constitutional: Patient appears well-developed ?HEENT:  ?Head: Normocephalic ?Eyes:EOM are normal ?Neck: Normal range of motion ?Cardiovascular: Normal rate ?Pulmonary/chest: Effort normal ?Neurologic: Patient is alert ?Skin: Skin is warm ?Psychiatric: Patient has normal mood and affect ? ? ?Ortho Exam: Ortho exam demonstrates full active and passive range of motion of the right shoulder.  On the left-hand side there is no deltoid function.  There is some atrophy and wasting of the biceps and deltoid.  Tricep strength is 4 out of 5 on the left 5 out of 5 on the right.  Patient has 5 out of 5 grip EPL FPL interosseous wrist flexion wrist extension strength bilaterally.  Wrist flexion strength is possibly slightly weaker on the left compared to the right.  Radial pulse intact bilaterally.  No lymphadenopathy noted in the shoulder girdle region. ? ?Specialty Comments:  ?No specialty comments available. ? ?Imaging: ?MR Cervical Spine w/o contrast ? ?Result Date: 03/18/2022 ?CLINICAL DATA:  MR cervical spine eval for source of left upper extremity weakness EXAM: MRI CERVICAL SPINE WITHOUT CONTRAST TECHNIQUE: Multiplanar, multisequence MR imaging of the cervical spine was performed. No intravenous contrast  was administered. COMPARISON:  Cervical radiographs March 16, 2022 without report. FINDINGS: Alignment: Reversal of the normal cervical lordosis. Retrolisthesis of C4 on C5, C5 on C6 and C6 on C7. Vertebrae: Degenerative/discogenic endplate signal changes at multiple levels, greatest at C3-C4 and C6-C7. No specific evidence of acute fracture or discitis/osteomyelitis. No suspicious bone lesions. Left C1-C2 facet joint effusion. Cord: Normal cord signal. Posterior Fossa, vertebral arteries, paraspinal tissues: Visualized vertebral artery flow voids are maintained in no  evidence of acute abnormality in the visualized lower posterior fossa. Disc levels: C2-C3: Posterior disc/osteophyte complex. Left greater than right facet and uncovertebral hypertrophy. Mild left foraminal stenosis without significant canal or right foraminal stenosis. C3-C4: Posterior disc osteophyte complex with bilateral facet and uncovertebral hypertrophy. Resulting severe left greater than right foraminal stenosis with mild to moderate canal stenosis and flattening of the right ventral cord. C4-C5: Posterior disc osteophyte complex with left greater than right facet and uncovertebral hypertrophy. Resulting severe left and mild-to-moderate right foraminal stenosis. Mild canal stenosis. C5-C6: Mild posterior disc osteophyte complex with bilateral facet and uncovertebral hypertrophy. Resulting severe bilateral foraminal stenosis with moderate canal stenosis. C6-C7: Posterior disc osteophyte complex with bilateral facet and uncovertebral hypertrophy. Resulting severe bilateral foraminal stenosis with moderate canal stenosis. C7-T1: Posterior disc osteophyte complex with left greater than right facet and uncovertebral hypertrophy. Resulting moderate left foraminal stenosis without significant canal stenosis. IMPRESSION: 1. Severe foraminal stenosis bilaterally at C3-C4, C5-C6 and C6-C7 and on the left at C4-C5. Milder multilevel foraminal stenosis is detailed above. 2. Moderate canal stenosis at C5-C6 and C6-C7 and mild-to-moderate canal stenosis at C3-C4. Electronically Signed   By: Margaretha Sheffield M.D.   On: 03/18/2022 14:16   ? ? ?PMFS History: ?Patient Active Problem List  ? Diagnosis Date Noted  ? Paroxysmal atrial fibrillation ([place]) 12/22/2021  ? Secondary hypercoagulable state ([place]) 12/22/2021  ? Medication management 10/20/2021  ? Chronic obstructive pulmonary disease ([facility] [place]) 10/19/2021  ? Alcohol withdrawal delirium ([facility]) 06/17/2021  ? Atrial fibrillation with rapid ventricular response ([facility]) 06/17/2021  ?  Macrocytic anemia 06/17/2021  ? COPD exacerbation ([facility]) 06/14/2021  ? Multifocal pneumonia   ? CAP (community acquired pneumonia) 06/13/2021  ? Emphysema lung ([facility]) 03/17/2021  ? Positive hepatitis C antibody test 10/02/2019  ? Tobacco dependence 09/24/2019  ? Alcohol use disorder, mild, abuse 09/24/2019  ? Orthostatic hypotension 06/12/2014  ? EKG abnormality 06/12/2014  ? Elevated blood pressure reading in office with diagnosis of hypertension 06/12/2014  ? ?Past Medical History:  ?Diagnosis Date  ? Atrial fibrillation ([place])   ? Chronic kidney disease   ? Hyperlipidemia   ? Hypertension   ? Tobacco abuse   ?  ?Family History  ?Problem Relation Age of Onset  ? Emphysema Father   ? CAD Father   ?     CABG  ? Stroke Mother   ? CAD Brother 52  ?     CABG  ? Hypertension Brother   ?  ?Past Surgical History:  ?Procedure Laterality Date  ? TONSILLECTOMY    ? ?Social History  ? ?Occupational History  ? Occupation: retired  ?Tobacco Use  ? Smoking status: Former  ?  Packs/day: 1.00  ?  Years: 50.00  ?  Pack years: 50.00  ?  Types: Cigarettes  ?  Start date: 12/19/1970  ? Smokeless tobacco: Former  ?Vaping Use  ? Vaping Use: Never used  ?Substance and Sexual Activity  ? Alcohol use: Not Currently  ?  Alcohol/week: 42.0 standard drinks  ?  Types: 28 Cans of beer, 14 Shots of liquor per week  ?  Comment: did not drink yesterday, was shaky today and wife gave him one shot pta  ? Drug use: No  ? Sexual activity: Yes  ?  Partners: Female  ?  Birth control/protection: None  ? ? ? ? ? ?

## 2022-03-21 ENCOUNTER — Other Ambulatory Visit: Payer: Self-pay | Admitting: Family Medicine

## 2022-03-21 NOTE — Telephone Encounter (Signed)
Requested medication (s) are due for refill today: Yes ? ?Requested medication (s) are on the active medication list: Yes ? ?Last refill:  2 months ago ? ?Future visit scheduled: No ? ?Notes to clinic:  Unable to refill per protocol, last refill by another provider. Patient is out and requesting refill asap ? ? ? ? ?Requested Prescriptions  ?Pending Prescriptions Disp Refills  ? TRELEGY ELLIPTA 100-62.5-25 MCG/ACT AEPB    ?  Sig: Take 1 puff by mouth daily.  ?  ? There is no refill protocol information for this order  ?  ? ? ? ? ?

## 2022-03-21 NOTE — Telephone Encounter (Signed)
Medication Refill - Medication: TRELEGY ELLIPTA 100-62.5-25 MCG/ACT AEPB ? ?Has the patient contacted their pharmacy? Yes.   ?([facility], but we [facility] not [facility]. ?Preferred Pharmacy (with phone number or street name): [id number] [place] ?Has the patient been seen for an appointment in the last year OR does the patient have an upcoming appointment? Yes.   ? ?Pt states he ran out yesterday, and needs this to breathe. ?The Afib clinic was the last to provide this for him, but he thinks Dr WRedmond Pullingis the one to refill this. ?

## 2022-03-22 ENCOUNTER — Other Ambulatory Visit (HOSPITAL_COMMUNITY): Payer: Self-pay | Admitting: Physician Assistant

## 2022-03-22 ENCOUNTER — Other Ambulatory Visit: Payer: Self-pay | Admitting: Family Medicine

## 2022-03-22 ENCOUNTER — Telehealth: Payer: Self-pay

## 2022-03-22 NOTE — Telephone Encounter (Signed)
Can you see if yates can see him this week? I sent him the info and he has looked at scan if not I will send to a nsu thx

## 2022-03-22 NOTE — Telephone Encounter (Signed)
Patient called seeing if he can get a sooner apt regarding his MRI results. He is stating that he has headache since the mri and pain in the back of his neck.  ? ?Please advise  ?

## 2022-03-22 NOTE — Telephone Encounter (Signed)
Copied from [place] [phone]. Topic: Quick Communication - Rx Refill/Question ?>> Mar 22, 2022 12:01 PM Yvette Rack wrote: ?Medication: TRELEGY ELLIPTA 100-62.5-25 MCG/ACT AEPB ? ?Has the patient contacted their pharmacy? Yes.  Pt told to contact provider ?(Agent: If no, request that the patient contact the pharmacy for the refill. If patient does not wish to contact the pharmacy document the reason why and proceed with request.) ?(Agent: If yes, when and what did the pharmacy advise?) ? ?Preferred Pharmacy (with phone number or street name): [id number] [place] [place]  ?Phone: [phone]?Fax: [phone]? ?Has the patient been seen for an appointment in the last year OR does the patient have an upcoming appointment? Yes.   ? ?Agent: Please be advised that RX refills may take up to 3 business days. We ask that you follow-up with your pharmacy. ?

## 2022-03-22 NOTE — Telephone Encounter (Signed)
I left voicemail for patient advising Dr. Marlou Sa was trying to get him an appointment with Dr. Lorin Mercy this week. Appt made for 8:45 in the morning. I asked for patient to return call if this will not work for him. ?

## 2022-03-22 NOTE — Telephone Encounter (Signed)
Pt last saw Clint Fenton, PA on 12/22/21, last labs 11/17/21 Creat 1.23, age 66, weight 71.1kg, based on specified criteria pt is on appropriate dosage of Eliquis '5mg'$  BID for afib.  Will refill rx.  ?

## 2022-03-23 ENCOUNTER — Ambulatory Visit (INDEPENDENT_AMBULATORY_CARE_PROVIDER_SITE_OTHER): Payer: Medicare HMO | Admitting: Orthopaedic Surgery

## 2022-03-23 ENCOUNTER — Encounter: Payer: Self-pay | Admitting: Orthopaedic Surgery

## 2022-03-23 DIAGNOSIS — M4802 Spinal stenosis, cervical region: Secondary | ICD-10-CM | POA: Diagnosis not present

## 2022-03-23 DIAGNOSIS — R29898 Other symptoms and signs involving the musculoskeletal system: Secondary | ICD-10-CM

## 2022-03-23 MED ORDER — TRELEGY ELLIPTA 100-62.5-25 MCG/ACT IN AEPB: 1.0000 | INHALATION_SPRAY | Freq: Every day | RESPIRATORY_TRACT | 0 refills | Status: DC

## 2022-03-23 NOTE — Addendum Note (Signed)
Addended by: Meyer Cory on: 03/23/2022 10:08 AM ? ? Modules accepted: Orders ? ?

## 2022-03-23 NOTE — Telephone Encounter (Signed)
Requested medication (s) are due for refill today: yes ? ?Requested medication (s) are on the active medication list: no ? ?Last refill:  01/01/22 ? ?Future visit scheduled: no ? ?Notes to clinic:  historical provider- no refill protocol ? ? ?Requested Prescriptions  ?Pending Prescriptions Disp Refills  ? TRELEGY ELLIPTA 100-62.5-25 MCG/ACT AEPB    ?  Sig: Take 1 puff by mouth daily.  ?  ? There is no refill protocol information for this order  ?  ? ? ? ? ?

## 2022-03-23 NOTE — Telephone Encounter (Signed)
Patient came in for appointment.  

## 2022-03-23 NOTE — Progress Notes (Signed)
? ?Office Visit Note ?  ?Patient: Ronald Lowe           ?Date of Birth: [birth year]           ?MRN: [id number] ?Visit Date: 03/23/2022 ?             ?Requested by: Dorna Mai, MD ?[phone] ?[place],  [place] [address] ?PCP: Dorna Mai, MD ? ? ?Assessment & Plan: ?Visit Diagnoses:  ?1. Foraminal stenosis of cervical region   ?2. Left arm weakness   ? ? ?Plan: We will obtain some EMGs nerve conduction velocities he does have severe foraminal stenosis right and left but has only motor symptoms this looks more like Parsonage-Turner syndrome.  Office follow-up after electrical test. ? ?Follow-Up Instructions: No follow-ups on file.  ? ?Orders:  ?No orders of the defined types were placed in this encounter. ? ?No orders of the defined types were placed in this encounter. ? ? ? ? Procedures: ?No procedures performed ? ? ?Clinical Data: ?No additional findings. ? ? ?Subjective: ?Chief Complaint  ?Patient presents with  ? Neck - Pain  ? ? ?HPI 66 year old male with 6 weeks history of flaccid deltoid biceps on the left side.  He states about 6 weeks ago he noticed his arm was weak took 8 pound dumbbells and tried to do several 100 bicep curls.  He states a couple days later he noticed the biceps did not work.  He is not able to leave his left arm over his head and has significant deltoid and biceps atrophy.  He denies sensory problems no injury problems.  MRI scan shows multilevel severe cervical spondylosis with severe foraminal stenosis right and left.  No myelopathic symptoms no gait disturbance no numbness or tingling in his hands.  Patient is on Eliquis.  No epidural hematoma seen on MRI scan. ?ROS: Patient has some COPD occasional shortness of breath history of atrial fibs hepatitis C significant alcohol use in the past with past withdrawals.  Systems noncontributory HPI. ? ? ? ?Objective: ?Vital Signs: BP (!) 158/101   Pulse 91   Ht 6' (1.829 m)   Wt 160 lb (72.6 kg)   BMI 21.70 kg/m?   ? ?Physical Exam ?Constitutional:   ?   Appearance: He is well-developed.  ?HENT:  ?   Head: Normocephalic and atraumatic.  ?   Right Ear: External ear normal.  ?   Left Ear: External ear normal.  ?Eyes:  ?   Pupils: Pupils are equal, round, and reactive to light.  ?Neck:  ?   Thyroid: No thyromegaly.  ?   Trachea: No tracheal deviation.  ?Cardiovascular:  ?   Rate and Rhythm: Normal rate.  ?Pulmonary:  ?   Effort: Pulmonary effort is normal.  ?   Breath sounds: No wheezing.  ?Abdominal:  ?   General: Bowel sounds are normal.  ?   Palpations: Abdomen is soft.  ?Musculoskeletal:  ?   Cervical back: Neck supple.  ?Skin: ?   General: Skin is warm and dry.  ?   Capillary Refill: Capillary refill takes less than 2 seconds.  ?Neurological:  ?   Mental Status: He is alert and oriented to person, place, and time.  ?Psychiatric:     ?   Behavior: Behavior normal.     ?   Thought Content: Thought content normal.     ?   Judgment: Judgment normal.  ? ? ?Ortho Exam sensory testing right and left hand is  normal interossei interactive and strong.  He has no left deltoid biceps. ? ?Specialty Comments:  ?No specialty comments available. ? ?Imaging: ?CLINICAL DATA:  MR cervical spine eval for source of left upper ?extremity weakness ?  ?EXAM: ?MRI CERVICAL SPINE WITHOUT CONTRAST ?  ?TECHNIQUE: ?Multiplanar, multisequence MR imaging of the cervical spine was ?performed. No intravenous contrast was administered. ?  ?COMPARISON:  Cervical radiographs March 16, 2022 without report. ?  ?FINDINGS: ?Alignment: Reversal of the normal cervical lordosis. Retrolisthesis ?of C4 on C5, C5 on C6 and C6 on C7. ?  ?Vertebrae: Degenerative/discogenic endplate signal changes at ?multiple levels, greatest at C3-C4 and C6-C7. No specific evidence ?of acute fracture or discitis/osteomyelitis. No suspicious bone ?lesions. Left C1-C2 facet joint effusion. ?  ?Cord: Normal cord signal. ?  ?Posterior Fossa, vertebral arteries, paraspinal tissues:  Visualized ?vertebral artery flow voids are maintained in no evidence of acute ?abnormality in the visualized lower posterior fossa. ?  ?Disc levels: ?  ?C2-C3: Posterior disc/osteophyte complex. Left greater than right ?facet and uncovertebral hypertrophy. Mild left foraminal stenosis ?without significant canal or right foraminal stenosis. ?  ?C3-C4: Posterior disc osteophyte complex with bilateral facet and ?uncovertebral hypertrophy. Resulting severe left greater than right ?foraminal stenosis with mild to moderate canal stenosis and ?flattening of the right ventral cord. ?  ?C4-C5: Posterior disc osteophyte complex with left greater than ?right facet and uncovertebral hypertrophy. Resulting severe left and ?mild-to-moderate right foraminal stenosis. Mild canal stenosis. ?  ?C5-C6: Mild posterior disc osteophyte complex with bilateral facet ?and uncovertebral hypertrophy. Resulting severe bilateral foraminal ?stenosis with moderate canal stenosis. ?  ?C6-C7: Posterior disc osteophyte complex with bilateral facet and ?uncovertebral hypertrophy. Resulting severe bilateral foraminal ?stenosis with moderate canal stenosis. ?  ?C7-T1: Posterior disc osteophyte complex with left greater than ?right facet and uncovertebral hypertrophy. Resulting moderate left ?foraminal stenosis without significant canal stenosis. ?  ?IMPRESSION: ?1. Severe foraminal stenosis bilaterally at C3-C4, C5-C6 and C6-C7 ?and on the left at C4-C5. Milder multilevel foraminal stenosis is ?detailed above. ?2. Moderate canal stenosis at C5-C6 and C6-C7 and mild-to-moderate ?canal stenosis at C3-C4. ?  ?  ?Electronically Signed ?  By: Margaretha Sheffield M.D. ?  On: 03/18/2022 14:16 ? ? ?PMFS History: ?Patient Active Problem List  ? Diagnosis Date Noted  ? Foraminal stenosis of cervical region 03/23/2022  ? Left arm weakness 03/23/2022  ? Paroxysmal atrial fibrillation ([facility]) 12/22/2021  ? Secondary hypercoagulable state ([facility]) 12/22/2021  ? Medication  management 10/20/2021  ? Chronic obstructive pulmonary disease ([facility]) 10/19/2021  ? Alcohol withdrawal delirium ([facility]) 06/17/2021  ? Atrial fibrillation with rapid ventricular response ([facility]) 06/17/2021  ? Macrocytic anemia 06/17/2021  ? COPD exacerbation ([facility]) 06/14/2021  ? Multifocal pneumonia   ? CAP (community acquired pneumonia) 06/13/2021  ? Emphysema lung ([facility]) 03/17/2021  ? Positive hepatitis C antibody test 10/02/2019  ? Tobacco dependence 09/24/2019  ? Alcohol use disorder, mild, abuse 09/24/2019  ? Orthostatic hypotension 06/12/2014  ? EKG abnormality 06/12/2014  ? Elevated blood pressure reading in office with diagnosis of hypertension 06/12/2014  ? ?Past Medical History:  ?Diagnosis Date  ? Atrial fibrillation ([facility])   ? Chronic kidney disease   ? Hyperlipidemia   ? Hypertension   ? Tobacco abuse   ?  ?Family History  ?Problem Relation Age of Onset  ? Emphysema Father   ? CAD Father   ?     CABG  ? Stroke Mother   ? CAD Brother 21  ?  CABG  ? Hypertension Brother   ?  ?Past Surgical History:  ?Procedure Laterality Date  ? TONSILLECTOMY    ? ?Social History  ? ?Occupational History  ? Occupation: retired  ?Tobacco Use  ? Smoking status: Former  ?  Packs/day: 1.00  ?  Years: 50.00  ?  Pack years: 50.00  ?  Types: Cigarettes  ?  Start date: 12/19/1970  ? Smokeless tobacco: Former  ?Vaping Use  ? Vaping Use: Never used  ?Substance and Sexual Activity  ? Alcohol use: Not Currently  ?  Alcohol/week: 42.0 standard drinks  ?  Types: 28 Cans of beer, 14 Shots of liquor per week  ?  Comment: did not drink yesterday, was shaky today and wife gave him one shot pta  ? Drug use: No  ? Sexual activity: Yes  ?  Partners: Female  ?  Birth control/protection: None  ? ? ? ? ? ? ?

## 2022-03-29 NOTE — Progress Notes (Deleted)
CARDIOLOGY CONSULT NOTE       Patient ID: Ronald Lowe MRN: [id number] DOB/AGE: 05/23/1956 66 y.o.  Admit date: (Not on file) Referring Physician: Redmond Pulling Primary Physician: Dorna Mai, MD Primary Cardiologist: [name] Reason for Consultation: Afib   HPI:  66 y.o. referred by Dr Redmond Pulling for afib. First seen 11/19/21  History of smoking, HTN, HLD and CRF.He has muscle cramps with statin He was taking his eliquis daily not bid "felt weird when taking bid. Discussed with pharmacist.  12/20/20 Also no longer taking amiodarone   Hospitalized 06/18/21 with multi focal pneumonia and ETOH withdrawal Had PAF while withdrawing. Rx amiodarone and transitioned to 200 mg daily Baseline Cr 1.9 TTE with EF 50-55% trivial MR Mild LAE 3.9 cm  Was in NSR at D/c with plans to d/c amiodarone after 30 days   Flu positive 10/23/2021. Went back into afib  He has not been compliant with his meds  Seen in afib clinic 12/22/21 on amiodarone once/day and was back in NSR  Never started cardizem   Seeing Dr Lorin Mercy for cervical foraminal stenosis ? Parsonage-Turner syndrome (brachial neuritis) EMG ordered 03/23/22   ***  ROS All other systems reviewed and negative except as noted above  Past Medical History:  Diagnosis Date   Atrial fibrillation ([place])    Chronic kidney disease    Hyperlipidemia    Hypertension    Tobacco abuse     Family History  Problem Relation Age of Onset   Emphysema Father    CAD Father        CABG   Stroke Mother    CAD Brother 44       CABG   Hypertension Brother     Social History   Socioeconomic History   Marital status: Married    Spouse name: Not on file   Number of children: 2   Years of education: Not on file   Highest education level: Not on file  Occupational History   Occupation: retired  Tobacco Use   Smoking status: Former    Packs/day: 1.00    Years: 50.00    Pack years: 50.00    Types: Cigarettes    Start date: 12/19/1970   Smokeless tobacco: Former  IT trainer Use: Never used  Substance and Sexual Activity   Alcohol use: Not Currently    Alcohol/week: 42.0 standard drinks    Types: 28 Cans of beer, 14 Shots of liquor per week    Comment: did not drink yesterday, was shaky today and wife gave him one shot pta   Drug use: No   Sexual activity: Yes    Partners: Female    Birth control/protection: None  Other Topics Concern   Not on file  Social History Narrative   Lives with girlfriend.     Social Determinants of Health   Financial Resource Strain: Low Risk    Difficulty of Paying Living Expenses: Not hard at all  Food Insecurity: No Food Insecurity   Worried About Charity fundraiser in the Last Year: Never true   [place] in the Last Year: Never true  Transportation Needs: No Transportation Needs   Lack of Transportation (Medical): No   Lack of Transportation (Non-Medical): No  Physical Activity: Insufficiently Active   Days of Exercise per Week: 3 days   Minutes of Exercise per Session: 20 min  Stress: No Stress Concern Present   Feeling of Stress : Not at all  Social Connections: Moderately Isolated   Frequency of Communication with Friends and Family: Twice a week   Frequency of Social Gatherings with Friends and Family: More than three times a week   Attends Religious Services: Never   Marine scientist or Organizations: Yes   Attends Archivist Meetings: Never   Marital Status: Separated  Intimate Partner Violence: Not At Risk   Fear of Current or Ex-Partner: No   Emotionally Abused: No   Physically Abused: No   Sexually Abused: No    Past Surgical History:  Procedure Laterality Date   TONSILLECTOMY        Current Outpatient Medications:    albuterol (VENTOLIN HFA) 108 (90 Base) MCG/ACT inhaler, TAKE 2 PUFFS BY MOUTH EVERY 6 HOURS AS NEEDED FOR WHEEZE OR SHORTNESS OF BREATH, Disp: 18 each, Rfl: 1   amiodarone (PACERONE) 200 MG tablet, Take 1 tablet (200 mg total) by mouth daily.,  Disp: , Rfl:    apixaban (ELIQUIS) 5 MG TABS tablet, TAKE 1 TABLET BY MOUTH TWICE A DAY, Disp: 180 tablet, Rfl: 1   atorvastatin (LIPITOR) 40 MG tablet, Take 1 tablet (40 mg total) by mouth every other day., Disp: , Rfl:    fluticasone-salmeterol (ADVAIR HFA) 45-21 MCG/ACT inhaler, Inhale 2 puffs into the lungs 2 (two) times daily., Disp: , Rfl:    lisinopril (ZESTRIL) 5 MG tablet, Take 1 tablet (5 mg total) by mouth every other day., Disp: 90 tablet, Rfl: 0   pantoprazole (PROTONIX) 40 MG tablet, Take 1 tablet (40 mg total) by mouth daily., Disp: 90 tablet, Rfl: 1   TRELEGY ELLIPTA 100-62.5-25 MCG/ACT AEPB, Take 1 puff by mouth daily., Disp: 1 each, Rfl: 0    Physical Exam: There were no vitals taken for this visit.    Affect appropriate Chronically ill male  HEENT: facial erythema  Neck supple with no adenopathy JVP normal no bruits no thyromegaly Lungs clear with no wheezing and good diaphragmatic motion Heart:  S1/S2 no murmur, no rub, gallop or click PMI normal Abdomen: benighn, BS positve, no tenderness, no AAA no bruit.  No HSM or HJR Distal pulses intact with no bruits No edema Neuro  tremulous no full blown DT;s  Skin warm and dry Left shoulder weakness left deltoid atrophy   Labs:   Lab Results  Component Value Date   WBC 6.6 11/17/2021   HGB 15.8 11/17/2021   HCT 45.4 11/17/2021   MCV 98.7 11/17/2021   PLT 217 11/17/2021    No results for input(s): NA, K, CL, CO2, BUN, CREATININE, CALCIUM, PROT, BILITOT, ALKPHOS, ALT, AST, GLUCOSE in the last 168 hours.  Invalid input(s): LABALBU  Lab Results  Component Value Date   CKTOTAL 225 04/07/2021   CKMB 4.7 (H) 06/12/2014    Lab Results  Component Value Date   CHOL 154 03/24/2021   CHOL 182 09/24/2019   CHOL 143 03/22/2010   Lab Results  Component Value Date   HDL 74 03/24/2021   HDL 68 09/24/2019   HDL 48 03/22/2010   Lab Results  Component Value Date   LDLCALC 66 03/24/2021   LDLCALC 99 09/24/2019    LDLCALC 83 03/22/2010   Lab Results  Component Value Date   TRIG 74 03/24/2021   TRIG 84 09/24/2019   TRIG 61 03/22/2010   Lab Results  Component Value Date   CHOLHDL 2.1 03/24/2021   CHOLHDL 2.7 09/24/2019   CHOLHDL 3.0 Ratio 03/22/2010   No results found for: LDLDIRECT  Radiology: MR Cervical Spine w/o contrast  Result Date: 03/18/2022 CLINICAL DATA:  MR cervical spine eval for source of left upper extremity weakness EXAM: MRI CERVICAL SPINE WITHOUT CONTRAST TECHNIQUE: Multiplanar, multisequence MR imaging of the cervical spine was performed. No intravenous contrast was administered. COMPARISON:  Cervical radiographs March 16, 2022 without report. FINDINGS: Alignment: Reversal of the normal cervical lordosis. Retrolisthesis of C4 on C5, C5 on C6 and C6 on C7. Vertebrae: Degenerative/discogenic endplate signal changes at multiple levels, greatest at C3-C4 and C6-C7. No specific evidence of acute fracture or discitis/osteomyelitis. No suspicious bone lesions. Left C1-C2 facet joint effusion. Cord: Normal cord signal. Posterior Fossa, vertebral arteries, paraspinal tissues: Visualized vertebral artery flow voids are maintained in no evidence of acute abnormality in the visualized lower posterior fossa. Disc levels: C2-C3: Posterior disc/osteophyte complex. Left greater than right facet and uncovertebral hypertrophy. Mild left foraminal stenosis without significant canal or right foraminal stenosis. C3-C4: Posterior disc osteophyte complex with bilateral facet and uncovertebral hypertrophy. Resulting severe left greater than right foraminal stenosis with mild to moderate canal stenosis and flattening of the right ventral cord. C4-C5: Posterior disc osteophyte complex with left greater than right facet and uncovertebral hypertrophy. Resulting severe left and mild-to-moderate right foraminal stenosis. Mild canal stenosis. C5-C6: Mild posterior disc osteophyte complex with bilateral facet and  uncovertebral hypertrophy. Resulting severe bilateral foraminal stenosis with moderate canal stenosis. C6-C7: Posterior disc osteophyte complex with bilateral facet and uncovertebral hypertrophy. Resulting severe bilateral foraminal stenosis with moderate canal stenosis. C7-T1: Posterior disc osteophyte complex with left greater than right facet and uncovertebral hypertrophy. Resulting moderate left foraminal stenosis without significant canal stenosis. IMPRESSION: 1. Severe foraminal stenosis bilaterally at C3-C4, C5-C6 and C6-C7 and on the left at C4-C5. Milder multilevel foraminal stenosis is detailed above. 2. Moderate canal stenosis at C5-C6 and C6-C7 and mild-to-moderate canal stenosis at C3-C4. Electronically Signed   By: Margaretha Sheffield M.D.   On: 03/18/2022 14:16   XR Cervical Spine 2 or 3 views  Result Date: 03/18/2022 AP lateral radiographs cervical spine reviewed.  There is loss of normal lordosis.  Diffuse degenerative disc disease is present at C3-4 C4-5 C5-6 and C6-7.  Anterior spurring noted in these regions.  Facet arthritis prevalent most at C3-4.  No acute fracture.  XR Shoulder Left  Result Date: 03/18/2022 AP lateral axillary radiographs left shoulder reviewed.  No acute fracture.  Acromiohumeral distance maintained.  No dislocation.  No AC joint or glenohumeral joint arthritis.  Visualized lung fields clear.  Particularly no abnormality of the superior lung fields.   EKG: SR rate 98 LVH 06/17/21    ASSESSMENT AND PLAN:   PAF:  June 2022 in setting of ETOH withdrawal and pneumonia converted to NSR on amiodarone Recurrence in setting of Infuenza and converted again on amiodarone CHADVASC 2 on eliquis with compliance issues *** Smoking : counseled on cessation No cancer on CTA 06/14/21 F/U CT lung cancer screening per [facility] June 2023 Has smoked 1-2 ppd for over 50 years  HTN:  Well controlled.  Continue current medications and low sodium Dash type diet.   HLD:  on statin LDL 66  03/23/21 ETOH:  counseled on moderation f/u primary ? Needing outpatient valium  Brachial Neuritis:  left shoulder weakness f/u Dr Lorin Mercy after EMG    F/U in a year   Signed: Jenkins Rouge 03/29/2022, 5:59 PM

## 2022-04-01 ENCOUNTER — Telehealth: Payer: Self-pay | Admitting: Physical Medicine and Rehabilitation

## 2022-04-01 NOTE — Telephone Encounter (Signed)
Patient's wife [name] called needing to cancel patient's appointment. [name] said patient is not feeling well and will need to reschedule at a later date.     [phone]  ?

## 2022-04-05 ENCOUNTER — Encounter: Payer: Self-pay | Admitting: Physical Medicine and Rehabilitation

## 2022-04-05 ENCOUNTER — Ambulatory Visit (INDEPENDENT_AMBULATORY_CARE_PROVIDER_SITE_OTHER): Payer: Medicare HMO | Admitting: Physical Medicine and Rehabilitation

## 2022-04-05 DIAGNOSIS — R202 Paresthesia of skin: Secondary | ICD-10-CM

## 2022-04-05 DIAGNOSIS — R29898 Other symptoms and signs involving the musculoskeletal system: Secondary | ICD-10-CM

## 2022-04-05 DIAGNOSIS — R531 Weakness: Secondary | ICD-10-CM

## 2022-04-05 NOTE — Progress Notes (Signed)
Pt state left shoulder pain. Pt state he lifted weights with his left arm and the next day his arm was sore. Pt state it hard for him to raise his left arm. Pt state right handed. ? ?Numeric Pain Rating Scale and Functional Assessment ?Average Pain 5 ? ? ?In the last MONTH (on 0-10 scale) has pain interfered with the following? ? ?1. General activity like being  able to carry out your everyday physical activities such as walking, climbing stairs, carrying groceries, or moving a chair?  ?Rating(10) ? ? ? ? ?

## 2022-04-06 ENCOUNTER — Ambulatory Visit: Payer: Medicare HMO | Admitting: Orthopedic Surgery

## 2022-04-06 ENCOUNTER — Ambulatory Visit: Payer: Medicare HMO | Admitting: Cardiovascular Disease

## 2022-04-06 NOTE — Procedures (Signed)
EMG & NCV Findings: ?Evaluation of the left median motor nerve showed prolonged distal onset latency (4.3 ms), reduced amplitude (4.3 mV), and decreased conduction velocity (Elbow-Wrist, 45 m/s).  The left median (across palm) sensory nerve showed prolonged distal peak latency (Wrist, 5.2 ms) and prolonged distal peak latency (Palm, 3.1 ms).  The left ulnar sensory nerve showed prolonged distal peak latency (4.8 ms) and decreased conduction velocity (Wrist-5th Digit, 29 m/s).  All remaining nerves (as indicated in the following tables) were within normal limits.   ? ?Needle evaluation of the left triceps muscle showed increased insertional activity, widespread spontaneous activity, slightly increased polyphasic potentials, diminished recruitment, and decreased interference pattern.  The left deltoid muscle showed decreased insertional activity, widespread spontaneous activity, increased motor unit amplitude, slightly increased polyphasic potentials, diminished recruitment, and decreased interference pattern.  The left brachioradialis and the left Ext Digitorum muscles showed increased insertional activity.  The left biceps muscle showed increased insertional activity, widespread spontaneous activity, increased motor unit amplitude, slightly increased polyphasic potentials, diminished recruitment, and decreased interference pattern.  All remaining muscles (as indicated in the following table) showed no evidence of electrical instability.   ? ?Impression: ?The above electrodiagnostic study is ABNORMAL but difficult to interpret.  The study reveals evidence consistent with severe acute C6 radiculopathy on the left but could also be upper trunk brachial plexopathy given the reported clinical mechanism of injury.  The patient does have cervical stenosis and foraminal stenosis on MRI.  However his clinical mechanism of injury does not seem consistent with that.  He did not have any viral prodrome that she would typically  see with Parsonage-Turner syndrome.  There is signs of reinnervation with polyphasic needle EMG findings.  This does bode well for potential recovery. ? ? ?There is also evidence of a moderate left median nerve entrapment at the wrist (carpal tunnel syndrome) affecting sensory and motor components.  ? ?Recommendations: ?1.  Follow-up with referring physician. ?2.  Continue current management of symptoms. ?3.  Suggest surgical evaluation, may want MRI of brachial plexus/shoulder.  Could refer to tertiary care neurology for more extensive electrodiagnostic study. ? ?___________________________ ?Laurence Spates FAAPMR ?Board Certified, Tax adviser of Physical Medicine and Rehabilitation ? ? ? ?Nerve Conduction Studies ?Anti Sensory Summary Table ? ? Stim Site NR Peak (ms) Norm Peak (ms) P-T Amp (?V) Norm P-T Amp Site1 Site2 Delta-P (ms) Dist (cm) Vel (m/s) Norm Vel (m/s)  ?Left Median Acr Palm Anti Sensory (2nd Digit)  28.2?C  ?Wrist    *5.2 <3.6 22.2 >10 Wrist Palm 2.1 0.0    ?Palm    *3.1 <2.0 17.1         ?Left Radial Anti Sensory (Base 1st Digit)  27.8?C  ?Wrist    2.9 <3.1 19.7  Wrist Base 1st Digit 2.9 0.0    ?Left Ulnar Anti Sensory (5th Digit)  28.1?C  ?Wrist    *4.8 <3.7 15.8 >15.0 Wrist 5th Digit 4.8 14.0 *29 >38  ? ?Motor Summary Table ? ? Stim Site NR Onset (ms) Norm Onset (ms) O-P Amp (mV) Norm O-P Amp Site1 Site2 Delta-0 (ms) Dist (cm) Vel (m/s) Norm Vel (m/s)  ?Left Median Motor (Abd Poll Brev)  28?C  ?Wrist    *4.3 <4.2 *4.3 >5 Elbow Wrist 5.2 23.5 *45 >50  ?Elbow    9.5  4.1         ?Left Ulnar Motor (Abd Dig Min)  28.2?C  ?Wrist    3.5 <4.2 6.5 >3 B Elbow Wrist  4.5 24.0 53 >53  ?B Elbow    8.0  7.6  A Elbow B Elbow 1.6 10.0 63 >53  ?A Elbow    9.6  7.1         ? ?EMG ? ? Side Muscle Nerve Root Ins Act Fibs Psw Amp Dur Poly Recrt Int Fraser Din Comment  ?Left 1stDorInt Ulnar C8-T1 Nml Nml Nml Nml Nml 0 Nml Nml   ?Left Abd Poll Brev Median C8-T1 Nml Nml Nml Nml Nml 0 Nml Nml   ?Left Triceps Radial C6-7-8 *Incr  *4+ *4+ Nml Nml *1+ *Reduced *50%   ?Left Deltoid Axillary C5-6 *Decr *4+ *4+ *Incr Nml *1+ *Reduced *50%   ?Left BrachioRad Radial C5-6 *Incr Nml Nml Nml Nml 0 Nml Nml   ?Left Biceps Musculocut C5-6 *Incr *4+ *4+ *Incr Nml *1+ *Reduced *50%   ?Left Ext Digitorum  Radial (Post Int) C7-8 *Incr Nml Nml Nml Nml 0 Nml Nml   ? ? ?Nerve Conduction Studies ?Anti Sensory Left/Right Comparison ? ? Stim Site L Lat (ms) R Lat (ms) L-R Lat (ms) L Amp (?V) R Amp (?V) L-R Amp (%) Site1 Site2 L Vel (m/s) R Vel (m/s) L-R Vel (m/s)  ?Median Acr Palm Anti Sensory (2nd Digit)  28.2?C  ?Wrist *5.2   22.2   Wrist Palm     ?Palm *3.1   17.1         ?Radial Anti Sensory (Base 1st Digit)  27.8?C  ?Wrist 2.9   19.7   Wrist Base 1st Digit     ?Ulnar Anti Sensory (5th Digit)  28.1?C  ?Wrist *4.8   15.8   Wrist 5th Digit *29    ? ?Motor Left/Right Comparison ? ? Stim Site L Lat (ms) R Lat (ms) L-R Lat (ms) L Amp (mV) R Amp (mV) L-R Amp (%) Site1 Site2 L Vel (m/s) R Vel (m/s) L-R Vel (m/s)  ?Median Motor (Abd Poll Brev)  28?C  ?Wrist *4.3   *4.3   Elbow Wrist *45    ?Elbow 9.5   4.1         ?Ulnar Motor (Abd Dig Min)  28.2?C  ?Wrist 3.5   6.5   B Elbow Wrist 53    ?B Elbow 8.0   7.6   A Elbow B Elbow 63    ?A Elbow 9.6   7.1         ? ? ? ?Waveforms: ?    ? ?   ? ? ?

## 2022-04-06 NOTE — Progress Notes (Signed)
? ?Ronald Lowe - 66 y.o. male MRN [id number]  Date of birth: 1956/01/19 ? ?Office Visit Note: ?Visit Date: 04/05/2022 ?PCP: Dorna Mai, MD ?Referred by: Meredith Pel, MD ? ?Subjective: ?Chief Complaint  ?Patient presents with  ? Left Shoulder - Pain  ? Left Arm - Pain  ? ?HPI:  Ronald Lowe is a 66 y.o. male who comes in today at the request of Dr. Anderson Malta and Dr. Rodell Perna for electrodiagnostic study of the Left upper extremities.  Patient is Right hand dominant.  He reports that sometime in February he had an episode where he was lifting weights on the right arm doing biceps curls and then taking the 8 pound dumbbell and raising it over his head.  The next day he decided that he should do the left side as well because he did not want to have 1 side more developed.  Doing the left side he did probably over 100-200 reps with a curl and then lifting overhead.  He reports the next day he was sore in his arm felt full but it did not hurt or was not weak.  He reports a couple of days later is when he really started feeling a lot of pain in the left shoulder and upper arm and soon after he became fairly weak in the shoulder with inability to raise the arm and keep his bicep flexed.  He denies any right-sided symptoms or frank neck pain.  He does report drinking alcohol during the weightlifting sessions.  He has had updated cervical MRI which is reviewed.  This did show some central stenosis and foraminal stenosis.  He does have a history of COPD.  He reports no viral prodrome or severe pain before the weakness.  Just a lot of pressure in the upper arm. ? ?ROS Otherwise per HPI. ? ?Assessment & Plan: ?Visit Diagnoses:  ?  ICD-10-CM   ?1. Weakness  R53.1 NCV with EMG (electromyography)  ?  ?2. Left arm weakness  R29.898 NCV with EMG (electromyography)  ?  ?  ?Plan: Impression: ?The above electrodiagnostic study is ABNORMAL but difficult to interpret.  The study reveals evidence consistent with severe  acute C6 radiculopathy on the left but could also be upper trunk brachial plexopathy given the reported clinical mechanism of injury.  The patient does have cervical stenosis and foraminal stenosis on MRI.  However his clinical mechanism of injury does not seem consistent with that.  He did not have any viral prodrome that she would typically see with Parsonage-Turner syndrome.  There is signs of reinnervation with polyphasic needle EMG findings.  This does bode well for potential recovery. ? ? ?There is also evidence of a moderate left median nerve entrapment at the wrist (carpal tunnel syndrome) affecting sensory and motor components.  ? ?Recommendations: ?1.  Follow-up with referring physician. ?2.  Continue current management of symptoms. ?3.  Suggest surgical evaluation, may want MRI of brachial plexus/shoulder.  Could refer to tertiary care neurology for more extensive electrodiagnostic study. ? ?Meds & Orders: No orders of the defined types were placed in this encounter. ?  ?Orders Placed This Encounter  ?Procedures  ? NCV with EMG (electromyography)  ?  ?Follow-up: Return for Rodell Perna, MD and Anderson Malta, MD.  ? ?Procedures: ?No procedures performed  ?EMG & NCV Findings: ?Evaluation of the left median motor nerve showed prolonged distal onset latency (4.3 ms), reduced amplitude (4.3 mV), and decreased conduction velocity (Elbow-Wrist, 45 m/s).  The left median (across palm) sensory nerve showed prolonged distal peak latency (Wrist, 5.2 ms) and prolonged distal peak latency (Palm, 3.1 ms).  The left ulnar sensory nerve showed prolonged distal peak latency (4.8 ms) and decreased conduction velocity (Wrist-5th Digit, 29 m/s).  All remaining nerves (as indicated in the following tables) were within normal limits.   ? ?Needle evaluation of the left triceps muscle showed increased insertional activity, widespread spontaneous activity, slightly increased polyphasic potentials, diminished recruitment, and  decreased interference pattern.  The left deltoid muscle showed decreased insertional activity, widespread spontaneous activity, increased motor unit amplitude, slightly increased polyphasic potentials, diminished recruitment, and decreased interference pattern.  The left brachioradialis and the left Ext Digitorum muscles showed increased insertional activity.  The left biceps muscle showed increased insertional activity, widespread spontaneous activity, increased motor unit amplitude, slightly increased polyphasic potentials, diminished recruitment, and decreased interference pattern.  All remaining muscles (as indicated in the following table) showed no evidence of electrical instability.   ? ?Impression: ?The above electrodiagnostic study is ABNORMAL but difficult to interpret.  The study reveals evidence consistent with severe acute C6 radiculopathy on the left but could also be upper trunk brachial plexopathy given the reported clinical mechanism of injury.  The patient does have cervical stenosis and foraminal stenosis on MRI.  However his clinical mechanism of injury does not seem consistent with that.  He did not have any viral prodrome that she would typically see with Parsonage-Turner syndrome.  There is signs of reinnervation with polyphasic needle EMG findings.  This does bode well for potential recovery. ? ? ?There is also evidence of a moderate left median nerve entrapment at the wrist (carpal tunnel syndrome) affecting sensory and motor components.  ? ?Recommendations: ?1.  Follow-up with referring physician. ?2.  Continue current management of symptoms. ?3.  Suggest surgical evaluation, may want MRI of brachial plexus/shoulder.  Could refer to tertiary care neurology for more extensive electrodiagnostic study. ? ?___________________________ ?Laurence Spates FAAPMR ?Board Certified, Tax adviser of Physical Medicine and Rehabilitation ? ? ? ?Nerve Conduction Studies ?Anti Sensory Summary Table ? ? Stim  Site NR Peak (ms) Norm Peak (ms) P-T Amp (?V) Norm P-T Amp Site1 Site2 Delta-P (ms) Dist (cm) Vel (m/s) Norm Vel (m/s)  ?Left Median Acr Palm Anti Sensory (2nd Digit)  28.2?C  ?Wrist    *5.2 <3.6 22.2 >10 Wrist Palm 2.1 0.0    ?Palm    *3.1 <2.0 17.1         ?Left Radial Anti Sensory (Base 1st Digit)  27.8?C  ?Wrist    2.9 <3.1 19.7  Wrist Base 1st Digit 2.9 0.0    ?Left Ulnar Anti Sensory (5th Digit)  28.1?C  ?Wrist    *4.8 <3.7 15.8 >15.0 Wrist 5th Digit 4.8 14.0 *29 >38  ? ?Motor Summary Table ? ? Stim Site NR Onset (ms) Norm Onset (ms) O-P Amp (mV) Norm O-P Amp Site1 Site2 Delta-0 (ms) Dist (cm) Vel (m/s) Norm Vel (m/s)  ?Left Median Motor (Abd Poll Brev)  28?C  ?Wrist    *4.3 <4.2 *4.3 >5 Elbow Wrist 5.2 23.5 *45 >50  ?Elbow    9.5  4.1         ?Left Ulnar Motor (Abd Dig Min)  28.2?C  ?Wrist    3.5 <4.2 6.5 >3 B Elbow Wrist 4.5 24.0 53 >53  ?B Elbow    8.0  7.6  A Elbow B Elbow 1.6 10.0 63 >53  ?A Elbow    9.6  7.1         ? ?EMG ? ? Side Muscle Nerve Root Ins Act Fibs Psw Amp Dur Poly Recrt Int Fraser Din Comment  ?Left 1stDorInt Ulnar C8-T1 Nml Nml Nml Nml Nml 0 Nml Nml   ?Left Abd Poll Brev Median C8-T1 Nml Nml Nml Nml Nml 0 Nml Nml   ?Left Triceps Radial C6-7-8 *Incr *4+ *4+ Nml Nml *1+ *Reduced *50%   ?Left Deltoid Axillary C5-6 *Decr *4+ *4+ *Incr Nml *1+ *Reduced *50%   ?Left BrachioRad Radial C5-6 *Incr Nml Nml Nml Nml 0 Nml Nml   ?Left Biceps Musculocut C5-6 *Incr *4+ *4+ *Incr Nml *1+ *Reduced *50%   ?Left Ext Digitorum  Radial (Post Int) C7-8 *Incr Nml Nml Nml Nml 0 Nml Nml   ? ? ?Nerve Conduction Studies ?Anti Sensory Left/Right Comparison ? ? Stim Site L Lat (ms) R Lat (ms) L-R Lat (ms) L Amp (?V) R Amp (?V) L-R Amp (%) Site1 Site2 L Vel (m/s) R Vel (m/s) L-R Vel (m/s)  ?Median Acr Palm Anti Sensory (2nd Digit)  28.2?C  ?Wrist *5.2   22.2   Wrist Palm     ?Palm *3.1   17.1         ?Radial Anti Sensory (Base 1st Digit)  27.8?C  ?Wrist 2.9   19.7   Wrist Base 1st Digit     ?Ulnar Anti Sensory (5th Digit)   28.1?C  ?Wrist *4.8   15.8   Wrist 5th Digit *29    ? ?Motor Left/Right Comparison ? ? Stim Site L Lat (ms) R Lat (ms) L-R Lat (ms) L Amp (mV) R Amp (mV) L-R Amp (%) Site1 Site2 L Vel (m/s) R Vel (m/s) L-R V

## 2022-04-12 ENCOUNTER — Encounter: Payer: Self-pay | Admitting: Orthopaedic Surgery

## 2022-04-12 ENCOUNTER — Ambulatory Visit (INDEPENDENT_AMBULATORY_CARE_PROVIDER_SITE_OTHER): Payer: Medicare HMO

## 2022-04-12 ENCOUNTER — Ambulatory Visit (INDEPENDENT_AMBULATORY_CARE_PROVIDER_SITE_OTHER): Payer: Medicare HMO | Admitting: Orthopaedic Surgery

## 2022-04-12 VITALS — BP 109/75 | HR 101 | Ht 72.0 in | Wt 160.0 lb

## 2022-04-12 DIAGNOSIS — M4802 Spinal stenosis, cervical region: Secondary | ICD-10-CM

## 2022-04-12 DIAGNOSIS — R29898 Other symptoms and signs involving the musculoskeletal system: Secondary | ICD-10-CM

## 2022-04-12 NOTE — Progress Notes (Signed)
? ?Office Visit Note ?  ?Patient: Ronald Lowe           ?Date of Birth: 1956-06-15           ?MRN: [id number] ?Visit Date: 04/12/2022 ?             ?Requested by: Dorna Mai, MD ?[phone] ?[place],  [place] [address] ?PCP: Dorna Mai, MD ? ? ?Assessment & Plan: ?Visit Diagnoses:  ?1. Left arm weakness   ?2. Foraminal stenosis of cervical region   ? ? ?Plan: Recommend 3 level cervical fusion C4-5 ,C5-6 and C6-7 based on his deltoid biceps atrophy.  He has moderate central stenosis at C5-6 and C6-7 and most severe foraminal stenosis at C4-5 consistent with his near complete deltoid atrophy.  Procedure discussed risk surgery discussed including dysphagia dysphonia and pseudoarthrosis.  Questions were elicited and answered.  He understands request to proceed.  Plan to be three-level cervical fusion with allograft and plate. ? ?Follow-Up Instructions: No follow-ups on file.  ? ?Orders:  ?Orders Placed This Encounter  ?Procedures  ? XR Chest 2 View  ? ?No orders of the defined types were placed in this encounter. ? ? ? ? Procedures: ?No procedures performed ? ? ?Clinical Data: ?No additional findings. ? ? ?Subjective: ?Chief Complaint  ?Patient presents with  ? Neck - Pain, Follow-up  ? Other  ?  EMG/NCV review study  ? ? ?HPI 66 year old male returns with significant left upper extremity atrophy of the deltoid and biceps.  Can abduct his arm and has changes in brachial radialis, triceps, deltoid.  Changes are consistent with severe acute C6 radiculopathy on the left.  Upper trunk brachial plexopathy was also mentioned in the report.  Chest x-ray shows no apical lung lesions.  Patient did have signs of reinnervation with polyphasic needle EMG findings.  He also had moderate left carpal tunnel changes with moderate left median nerve entrapment at the wrist.  He is not able to use his left arm since he cannot flex or abduct his shoulder is able to only flex his elbow with mild activity weakness in  grip and numbness in his hand.  Patient had past history of alcohol withdrawal COPD emphysema and atrial fibrillation. ? ?Review of Systems all systems noncontributory to HPI. ? ? ?Objective: ?Vital Signs: BP 109/75   Pulse (!) 101   Ht 6' (1.829 m)   Wt 160 lb (72.6 kg)   BMI 21.70 kg/m?  ? ?Physical Exam ?Constitutional:   ?   Appearance: He is well-developed.  ?HENT:  ?   Head: Normocephalic and atraumatic.  ?   Right Ear: External ear normal.  ?   Left Ear: External ear normal.  ?Eyes:  ?   Pupils: Pupils are equal, round, and reactive to light.  ?Neck:  ?   Thyroid: No thyromegaly.  ?   Trachea: No tracheal deviation.  ?Cardiovascular:  ?   Rate and Rhythm: Normal rate.  ?Pulmonary:  ?   Effort: Pulmonary effort is normal.  ?   Breath sounds: No wheezing.  ?Abdominal:  ?   General: Bowel sounds are normal.  ?   Palpations: Abdomen is soft.  ?Musculoskeletal:  ?   Cervical back: Neck supple.  ?Skin: ?   General: Skin is warm and dry.  ?   Capillary Refill: Capillary refill takes less than 2 seconds.  ?Neurological:  ?   Mental Status: He is alert and oriented to person, place, and time.  ?  Psychiatric:     ?   Behavior: Behavior normal.     ?   Thought Content: Thought content normal.     ?   Judgment: Judgment normal.  ? ? ?Ortho Exam severe biceps and deltoid atrophy left.  Some brachial plexus tenderness on the left mild on the right.  Triceps is active and strong.  Good finger flexion weakness finger extension on the left normal on the right.  No thenar atrophy. ? ?Specialty Comments:  ?MRI CERVICAL SPINE WITHOUT CONTRAST ?  ?TECHNIQUE: ?Multiplanar, multisequence MR imaging of the cervical spine was ?performed. No intravenous contrast was administered. ?  ?COMPARISON:  Cervical radiographs March 16, 2022 without report. ?  ?FINDINGS: ?Alignment: Reversal of the normal cervical lordosis. Retrolisthesis ?of C4 on C5, C5 on C6 and C6 on C7. ?  ?Vertebrae: Degenerative/discogenic endplate signal changes  at ?multiple levels, greatest at C3-C4 and C6-C7. No specific evidence ?of acute fracture or discitis/osteomyelitis. No suspicious bone ?lesions. Left C1-C2 facet joint effusion. ?  ?Cord: Normal cord signal. ?  ?Posterior Fossa, vertebral arteries, paraspinal tissues: Visualized ?vertebral artery flow voids are maintained in no evidence of acute ?abnormality in the visualized lower posterior fossa. ?  ?Disc levels: ?  ?C2-C3: Posterior disc/osteophyte complex. Left greater than right ?facet and uncovertebral hypertrophy. Mild left foraminal stenosis ?without significant canal or right foraminal stenosis. ?  ?C3-C4: Posterior disc osteophyte complex with bilateral facet and ?uncovertebral hypertrophy. Resulting severe left greater than right ?foraminal stenosis with mild to moderate canal stenosis and ?flattening of the right ventral cord. ?  ?C4-C5: Posterior disc osteophyte complex with left greater than ?right facet and uncovertebral hypertrophy. Resulting severe left and ?mild-to-moderate right foraminal stenosis. Mild canal stenosis. ?  ?C5-C6: Mild posterior disc osteophyte complex with bilateral facet ?and uncovertebral hypertrophy. Resulting severe bilateral foraminal ?stenosis with moderate canal stenosis. ?  ?C6-C7: Posterior disc osteophyte complex with bilateral facet and ?uncovertebral hypertrophy. Resulting severe bilateral foraminal ?stenosis with moderate canal stenosis. ?  ?C7-T1: Posterior disc osteophyte complex with left greater than ?right facet and uncovertebral hypertrophy. Resulting moderate left ?foraminal stenosis without significant canal stenosis. ?  ?IMPRESSION: ?1. Severe foraminal stenosis bilaterally at C3-C4, C5-C6 and C6-C7 ?and on the left at C4-C5. Milder multilevel foraminal stenosis is ?detailed above. ?2. Moderate canal stenosis at C5-C6 and C6-C7 and mild-to-moderate ?canal stenosis at C3-C4. ?  ?  ?Electronically Signed ?  By: Margaretha Sheffield M.D. ?  On: 03/18/2022  14:16 ? ?Imaging: ?XR Chest 2 View ? ?Result Date: 04/12/2022 ?PA and lateral chest x-ray obtained and reviewed.  No pneumothorax no evidence of left Pancoast lesion.  No airspace disease. Impression: Chest x-ray negative for acute changes.  No apical masses.  ? ?Interpretation Summary ? ?EMG & NCV Findings: ?Evaluation of the left median motor nerve showed prolonged distal onset latency (4.3 ms), reduced amplitude (4.3 mV), and decreased conduction velocity (Elbow-Wrist, 45 m/s).  The left median (across palm) sensory nerve showed prolonged distal peak latency (Wrist, 5.2 ms) and prolonged distal peak latency (Palm, 3.1 ms).  The left ulnar sensory nerve showed prolonged distal peak latency (4.8 ms) and decreased conduction velocity (Wrist-5th Digit, 29 m/s).  All remaining nerves (as indicated in the following tables) were within normal limits.   ?  ?Needle evaluation of the left triceps muscle showed increased insertional activity, widespread spontaneous activity, slightly increased polyphasic potentials, diminished recruitment, and decreased interference pattern.  The left deltoid muscle showed decreased insertional activity, widespread spontaneous activity,  increased motor unit amplitude, slightly increased polyphasic potentials, diminished recruitment, and decreased interference pattern.  The left brachioradialis and the left Ext Digitorum muscles showed increased insertional activity.  The left biceps muscle showed increased insertional activity, widespread spontaneous activity, increased motor unit amplitude, slightly increased polyphasic potentials, diminished recruitment, and decreased interference pattern.  All remaining muscles (as indicated in the following table) showed no evidence of electrical instability.   ?  ?Impression: ?The above electrodiagnostic study is ABNORMAL but difficult to interpret.  The study reveals evidence consistent with severe acute C6 radiculopathy on the left but could also be  upper trunk brachial plexopathy given the reported clinical mechanism of injury.  The patient does have cervical stenosis and foraminal stenosis on MRI.  However his clinical mechanism of injury does not seem consistent

## 2022-04-13 ENCOUNTER — Other Ambulatory Visit: Payer: Self-pay | Admitting: Internal Medicine

## 2022-04-13 DIAGNOSIS — Z87891 Personal history of nicotine dependence: Secondary | ICD-10-CM

## 2022-04-13 DIAGNOSIS — J439 Emphysema, unspecified: Secondary | ICD-10-CM

## 2022-04-13 DIAGNOSIS — R058 Other specified cough: Secondary | ICD-10-CM

## 2022-04-14 NOTE — Telephone Encounter (Signed)
Requested medication (s) are due for refill today - unsure ? ?Requested medication (s) are on the active medication list -yes ? ?Future visit scheduled -no ? ?Last refill: unsure ? ?Notes to clinic: Request RF: historical provider ? ?Requested Prescriptions  ?Pending Prescriptions Disp Refills  ? fluticasone-salmeterol (ADVAIR HFA) 45-21 MCG/ACT inhaler [Pharmacy Med Name: FLUTICASONE-SALMETEROL 45-21] 12 each 12  ?  Sig: INHALE 2 PUFFS INTO THE LUNGS TWICE A DAY  ?  ? Pulmonology:  Combination Products Passed - 04/13/2022  2:44 AM  ?  ?  Passed - Valid encounter within last 12 months  ?  Recent Outpatient Visits   ? ?      ? 1 month ago Essential hypertension  ? Primary Care at [facility], Clyde Canterbury, MD  ? 1 month ago Tendinitis of left shoulder  ? Primary Care at [place], PA-C  ? 4 months ago Dysthymia  ? Primary Care at [facility], Clyde Canterbury, MD  ? 5 months ago Atrial fibrillation with rapid ventricular response ([place])  ? Primary Care at [facility], MD  ? 5 months ago Medication management  ? [place], RPH-CPP  ? ?  ?  ?Future Appointments   ? ?        ? In 1 month Josue Hector, MD [facility], [facility]  ? ?  ? ? ?  ?  ?  ?Signed Prescriptions Disp Refills  ? albuterol (VENTOLIN HFA) 108 (90 Base) MCG/ACT inhaler 18 each 1  ?  Sig: TAKE 2 PUFFS BY MOUTH EVERY 6 HOURS AS NEEDED FOR WHEEZE OR SHORTNESS OF BREATH  ?  ? Pulmonology:  Beta [employer] 2 Passed - 04/13/2022  2:44 AM  ?  ?  Passed - Last BP in normal range  ?  BP Readings from Last 1 Encounters:  ?04/12/22 109/75  ?  ?  ?  ?  Passed - Last Heart Rate in normal range  ?  Pulse Readings from Last 1 Encounters:  ?04/12/22 (!) 101  ?  ?  ?  ?  Passed - Valid encounter within last 12 months  ?  Recent Outpatient Visits   ? ?      ? 1 month ago Essential hypertension  ? Primary Care at [facility], Clyde Canterbury, MD  ? 1  month ago Tendinitis of left shoulder  ? Primary Care at [place], PA-C  ? 4 months ago Dysthymia  ? Primary Care at [facility], Clyde Canterbury, MD  ? 5 months ago Atrial fibrillation with rapid ventricular response ([place])  ? Primary Care at [facility], MD  ? 5 months ago Medication management  ? [place], RPH-CPP  ? ?  ?  ?Future Appointments   ? ?        ? In 1 month Josue Hector, MD [facility], [facility]  ? ?  ? ? ?  ?  ?  ? ? ? ?Requested Prescriptions  ?Pending Prescriptions Disp Refills  ? fluticasone-salmeterol (ADVAIR HFA) 45-21 MCG/ACT inhaler [Pharmacy Med Name: FLUTICASONE-SALMETEROL 45-21] 12 each 12  ?  Sig: INHALE 2 PUFFS INTO THE LUNGS TWICE A DAY  ?  ? Pulmonology:  Combination Products Passed - 04/13/2022  2:44 AM  ?  ?  Passed - Valid encounter within last 12 months  ?  Recent Outpatient Visits   ? ?      ? 1 month ago Essential hypertension  ? Primary Care at [facility], Clyde Canterbury, MD  ? 1 month ago Tendinitis of left shoulder  ? Primary Care at [place], PA-C  ? 4 months ago Dysthymia  ? Primary Care at [facility], Clyde Canterbury, MD  ? 5 months ago Atrial fibrillation with rapid ventricular response ([place])  ? Primary Care at [facility], MD  ? 5 months ago Medication management  ? [place], RPH-CPP  ? ?  ?  ?Future Appointments   ? ?        ? In 1 month Josue Hector, MD [facility] Office, [facility]  ? ?  ? ? ?  ?  ?  ?Signed Prescriptions Disp Refills  ? albuterol (VENTOLIN HFA) 108 (90 Base) MCG/ACT inhaler 18 each 1  ?  Sig: TAKE 2 PUFFS BY MOUTH EVERY 6 HOURS AS NEEDED FOR WHEEZE OR SHORTNESS OF BREATH  ?  ? Pulmonology:  Beta Agon[employer] 2 Passed - 04/13/2022  2:44 AM  ?  ?  Passed - Last BP in normal range  ?  BP Readings from Last 1 Encounters:   ?04/12/22 109/75  ?  ?  ?  ?  Passed - Last Heart Rate in normal range  ?  Pulse Readings from Last 1 Encounters:  ?04/12/22 (!) 101  ?  ?  ?  ?  Passed - Valid encounter within last 12 months  ?  Recent Outpatient Visits   ? ?      ? 1 month ago Essential hypertension  ? Primary Care at [facility], Clyde Canterbury, MD  ? 1 month ago Tendinitis of left shoulder  ? Primary Care at [place], PA-C  ? 4 months ago Dysthymia  ? Primary Care at [facility], Clyde Canterbury, MD  ? 5 months ago Atrial fibrillation with rapid ventricular response ([place])  ? Primary Care at [facility], MD  ? 5 months ago Medication management  ? [place], RPH-CPP  ? ?  ?  ?Future Appointments   ? ?        ? In 1 month Josue Hector, MD [facility] Office, [facility]  ? ?  ? ? ?  ?  ?  ? ? ? ?

## 2022-04-14 NOTE — Telephone Encounter (Signed)
Requested Prescriptions  ?Pending Prescriptions Disp Refills  ?? albuterol (VENTOLIN HFA) 108 (90 Base) MCG/ACT inhaler [Pharmacy Med Name: ALBUTEROL HFA (VENTOLIN) INH] 18 each 1  ?  Sig: TAKE 2 PUFFS BY MOUTH EVERY 6 HOURS AS NEEDED FOR WHEEZE OR SHORTNESS OF BREATH  ?  ? Pulmonology:  Beta Agonists 2 Passed - 04/13/2022  2:44 AM  ?  ?  Passed - Last BP in normal range  ?  BP Readings from Last 1 Encounters:  ?04/12/22 109/75  ?   ?  ?  Passed - Last Heart Rate in normal range  ?  Pulse Readings from Last 1 Encounters:  ?04/12/22 (!) 101  ?   ?  ?  Passed - Valid encounter within last 12 months  ?  Recent Outpatient Visits   ?      ? 1 month ago Essential hypertension  ? Primary Care at [facility], Clyde Canterbury, MD  ? 1 month ago Tendinitis of left shoulder  ? Primary Care at [place], PA-C  ? 4 months ago Dysthymia  ? Primary Care at [facility], Clyde Canterbury, MD  ? 5 months ago Atrial fibrillation with rapid ventricular response ([place])  ? Primary Care at [facility], MD  ? 5 months ago Medication management  ? [place], RPH-CPP  ?  ?  ?Future Appointments   ?        ? In 1 month Josue Hector, MD [facility], [facility]  ?  ? ?  ?  ?  ?? fluticasone-salmeterol (ADVAIR HFA) 45-21 MCG/ACT inhaler [Pharmacy Med Name: FLUTICASONE-SALMETEROL 45-21] 12 each 12  ?  Sig: INHALE 2 PUFFS INTO THE LUNGS TWICE A DAY  ?  ? Pulmonology:  Combination Products Passed - 04/13/2022  2:44 AM  ?  ?  Passed - Valid encounter within last 12 months  ?  Recent Outpatient Visits   ?      ? 1 month ago Essential hypertension  ? Primary Care at [facility], Clyde Canterbury, MD  ? 1 month ago Tendinitis of left shoulder  ? Primary Care at [place], PA-C  ? 4 months ago Dysthymia  ? Primary Care at [facility], Clyde Canterbury, MD  ? 5 months ago Atrial fibrillation with rapid ventricular  response ([place])  ? Primary Care at [facility], MD  ? 5 months ago Medication management  ? [place], RPH-CPP  ?  ?  ?Future Appointments   ?        ? In 1 month Josue Hector, MD [facility] Office, [facility]  ?  ? ?  ?  ?  ? ?

## 2022-04-20 ENCOUNTER — Other Ambulatory Visit: Payer: Self-pay | Admitting: Cardiovascular Disease

## 2022-04-26 ENCOUNTER — Other Ambulatory Visit: Payer: Self-pay | Admitting: Internal Medicine

## 2022-04-26 DIAGNOSIS — I10 Essential (primary) hypertension: Secondary | ICD-10-CM

## 2022-05-03 DIAGNOSIS — G8929 Other chronic pain: Secondary | ICD-10-CM | POA: Diagnosis not present

## 2022-05-03 DIAGNOSIS — M25512 Pain in left shoulder: Secondary | ICD-10-CM | POA: Diagnosis not present

## 2022-05-03 DIAGNOSIS — G545 Neuralgic amyotrophy: Secondary | ICD-10-CM | POA: Diagnosis not present

## 2022-05-04 ENCOUNTER — Other Ambulatory Visit: Payer: Self-pay | Admitting: Neurosurgery

## 2022-05-04 DIAGNOSIS — G8929 Other chronic pain: Secondary | ICD-10-CM

## 2022-05-04 DIAGNOSIS — G545 Neuralgic amyotrophy: Secondary | ICD-10-CM

## 2022-05-17 ENCOUNTER — Ambulatory Visit
Admission: RE | Admit: 2022-05-17 | Discharge: 2022-05-17 | Disposition: A | Discharge status: Home / Self Care | Payer: Medicare HMO | Source: Ambulatory Visit | Attending: Neurosurgery | Admitting: Neurosurgery

## 2022-05-17 DIAGNOSIS — M25512 Pain in left shoulder: Secondary | ICD-10-CM | POA: Diagnosis not present

## 2022-05-17 DIAGNOSIS — G545 Neuralgic amyotrophy: Secondary | ICD-10-CM

## 2022-05-17 DIAGNOSIS — G8929 Other chronic pain: Secondary | ICD-10-CM | POA: Insufficient documentation

## 2022-05-17 DIAGNOSIS — S4992XA Unspecified injury of left shoulder and upper arm, initial encounter: Secondary | ICD-10-CM | POA: Diagnosis not present

## 2022-05-17 IMAGING — MR MR SHOULDER*L* W/O CM
4 of 5 series · 31 of 40 positions shown · non-contrast
Comparison: Left shoulder radiographs [DATE]

CLINICAL DATA: Left shoulder and arm injury while working [DATE]
months ago. Weakness.

EXAM:
MRI OF THE LEFT SHOULDER WITHOUT CONTRAST
TECHNIQUE: Multiplanar, multisequence MR imaging of the shoulder was performed.
No intravenous contrast was administered.

[Series 5: T2 fat-sat · axial · left · 4.0mm · 0.44mm/px · z∈[-55,+64]mm · 8 of 26 slices shown (1 of 3)]
[im 1/26]
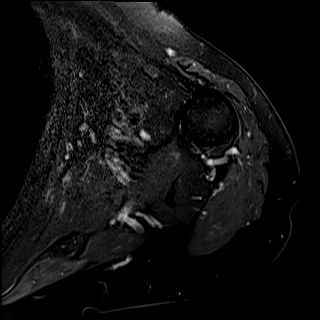
[im 4/26]
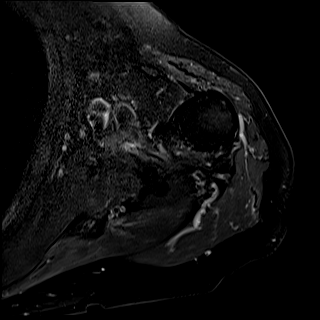
[im 8/26]
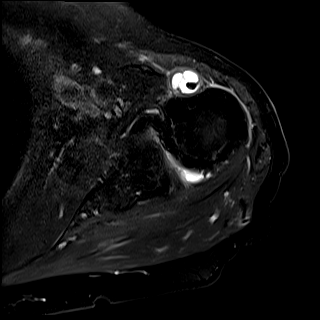
[im 11/26]
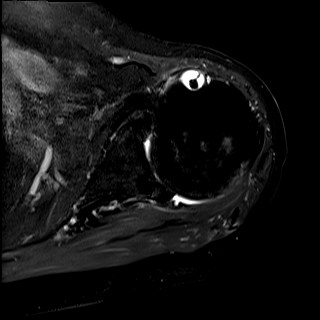
[im 15/26]
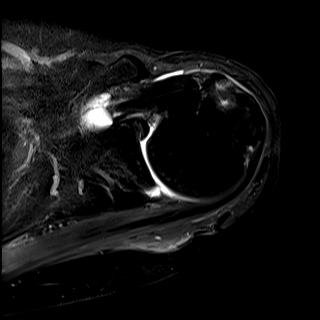
[im 18/26]
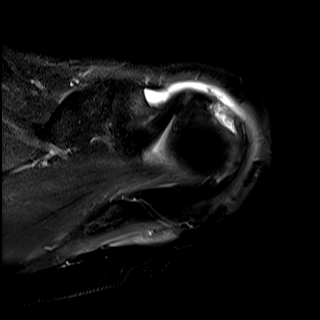
[im 22/26]
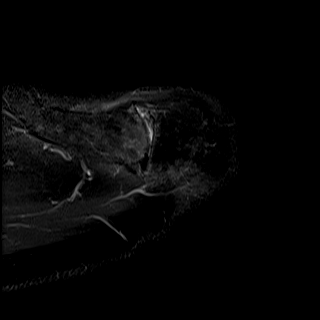
[im 26/26]
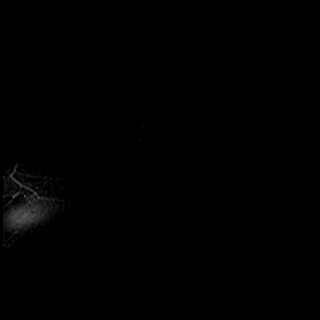

[Series 6: PD · coronal · left · 4.0mm · 0.44mm/px · 9 of 26 slices shown]
[im 1/26]
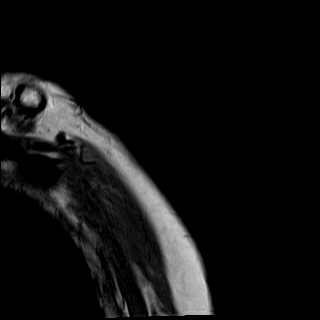
[im 4/26]
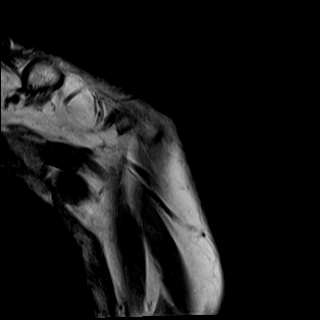
[im 7/26]
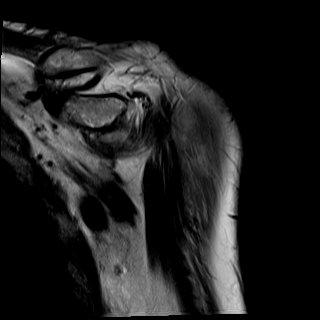
[im 10/26]
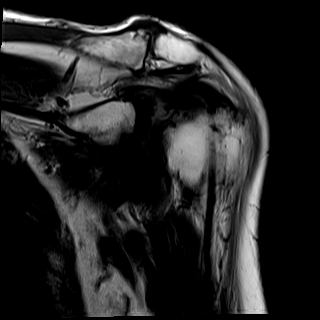
[im 13/26]
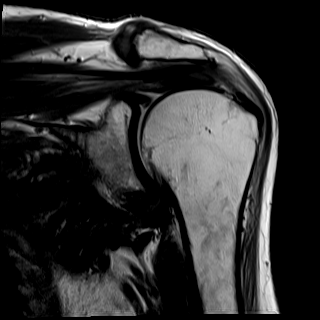
[im 16/26]
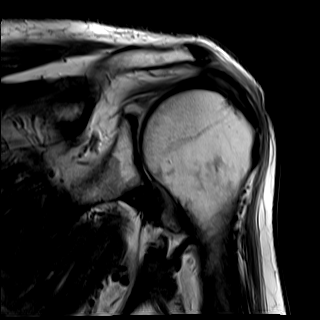
[im 19/26]
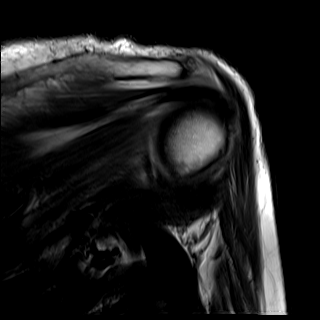
[im 22/26]
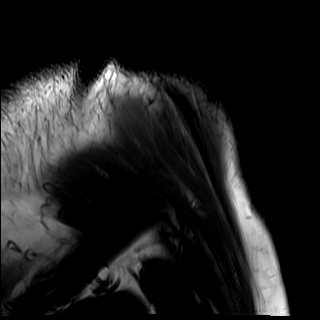
[im 26/26]
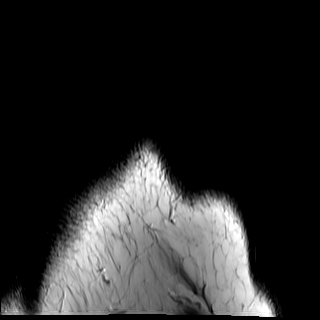

[Series 7: T2 fat-sat · coronal · left · 4.0mm · 0.44mm/px · 9 of 26 slices shown (2 of 3)]
[im 1/26]
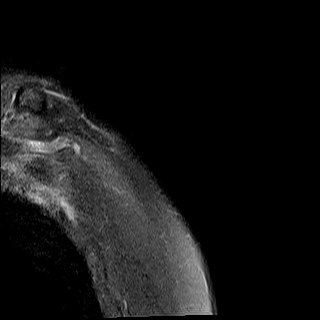
[im 4/26]
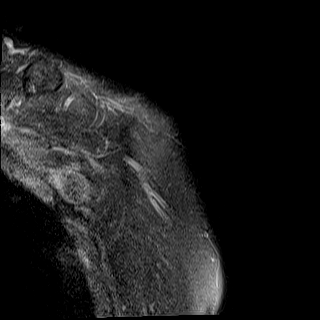
[im 7/26]
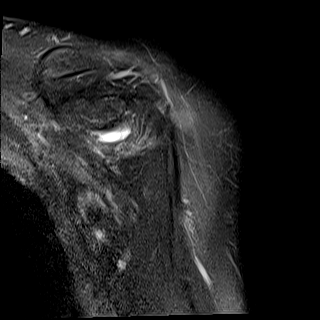
[im 10/26]
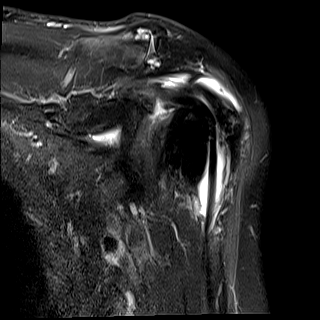
[im 13/26]
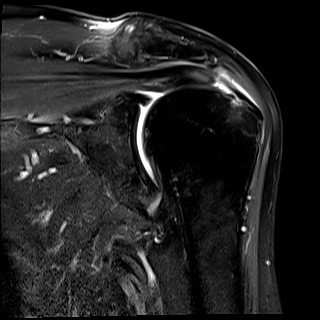
[im 16/26]
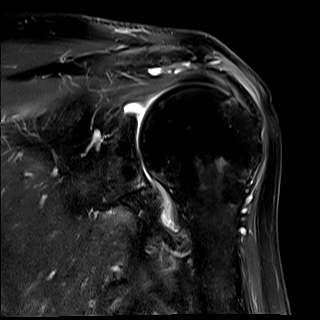
[im 19/26]
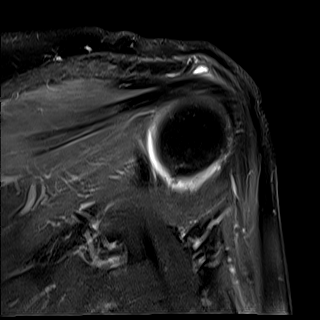
[im 22/26]
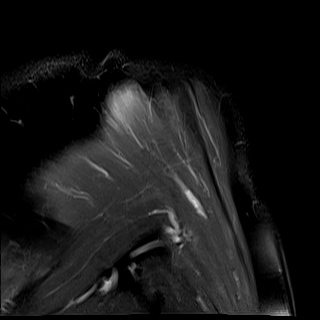
[im 26/26]
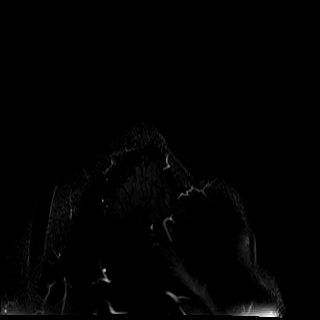

[Series 8: T2 fat-sat · sagittal · left · 4.0mm · 0.22mm/px · 5 of 22 slices shown (3 of 3)]
[im 1/22]
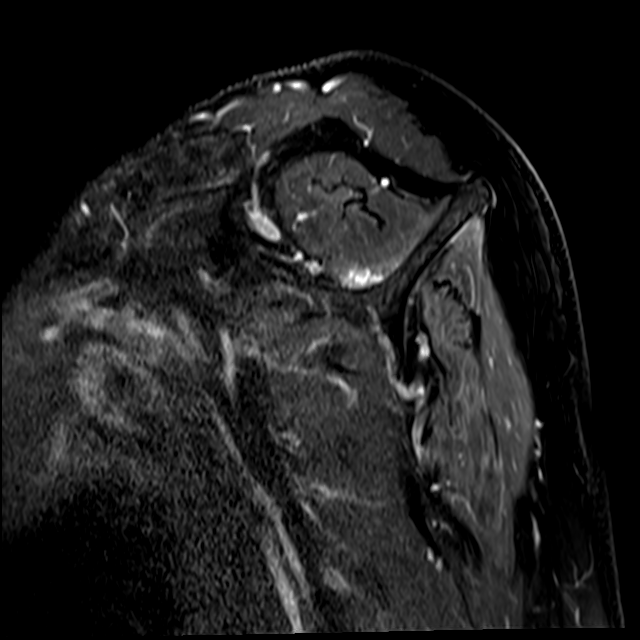
[im 4/22]
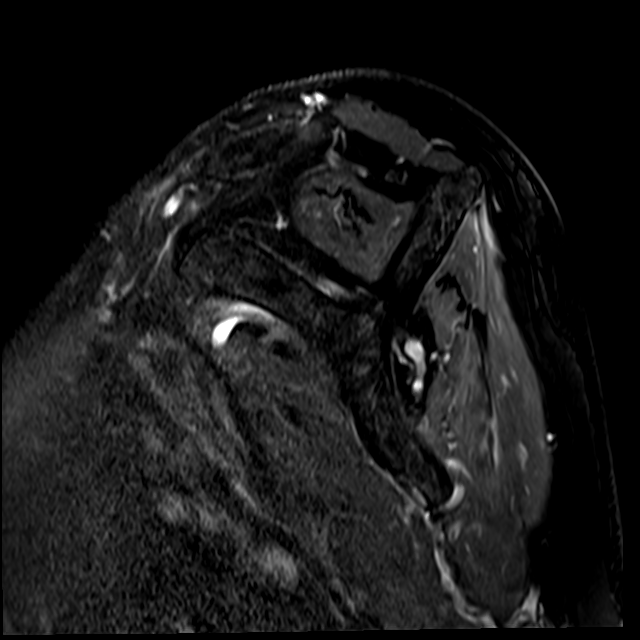
[im 8/22]
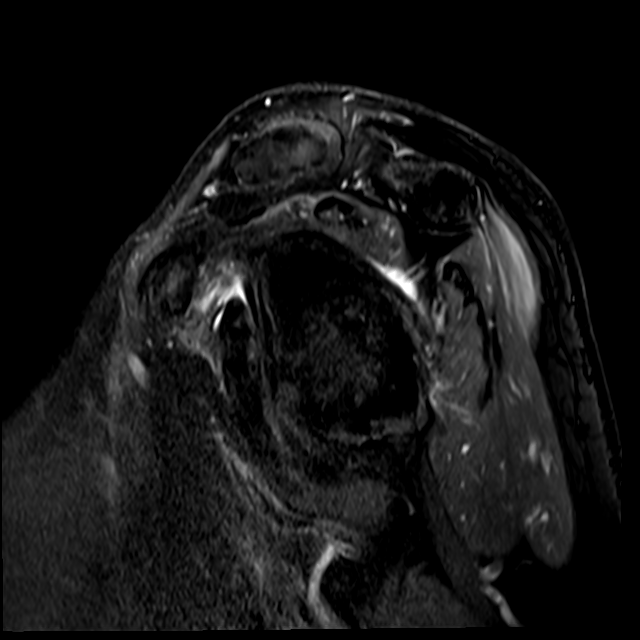
[im 11/22]
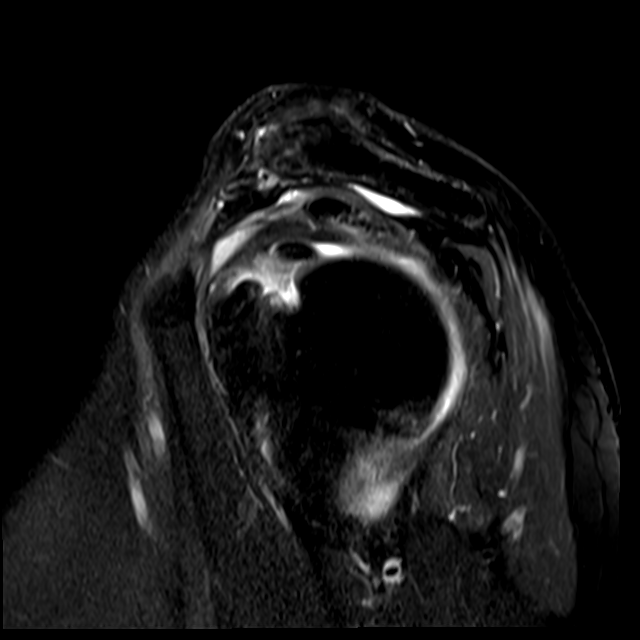
[im 18/22]
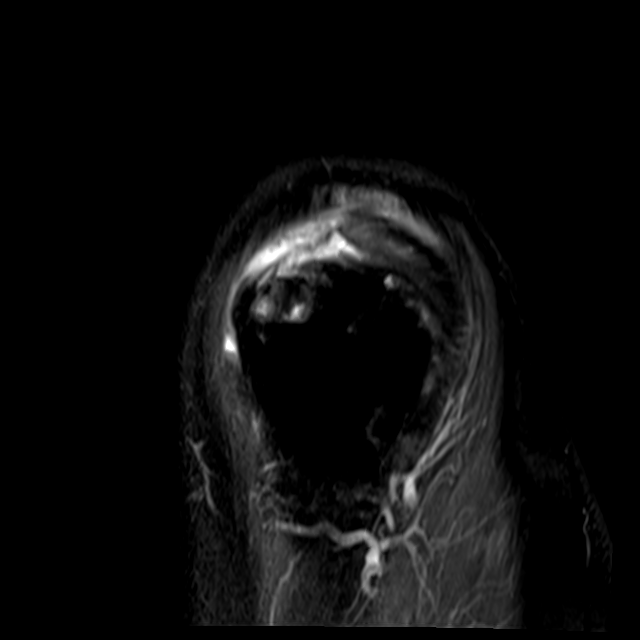

[31 of 40 positions shown; findings below may reference images not displayed]

FINDINGS: Rotator cuff: There is increased T2 signal within the tendon
footprint of the anterior 2.6 cm of the supraspinatus indicating
multiple partial-thickness tears. This is greatest within the mid
and anterior aspect of the supraspinatus tendon footprint where
there are multiple articular sided tendon tears (coronal series 7
images 14 through 16) involving up to approximately 75% of the
transverse tendon dimension. Posterior to this, there is more mild
articular sided tendon tearing (coronal series 7 images 12 and 13).
Mild anterior infraspinatus tendinosis. The subscapularis and teres
minor are intact.

Muscles: Mild-to-moderate supraspinatus and anterior infraspinatus
muscle atrophy.

Biceps long head: The intra-articular long head of the biceps tendon
is intact.

Acromioclavicular Joint: There are moderate degenerative changes of
the acromioclavicular joint including joint space narrowing,
subchondral marrow edema, and peripheral osteophytosis. Type II
acromion. Mild-to-moderate subacromial/subdeltoid bursitis.

Glenohumeral Joint: Moderate thinning of the glenoid and humeral
head cartilage. Mild peripheral inferior glenoid degenerative
osteophytosis. Mild posteroinferior glenoid subchondral marrow
edema.

Labrum: Mild degenerative attenuation and irregularity of the
posterosuperior glenoid labrum.

Bones: Moderate subcortical marrow edema and cystic change within
the greater tuberosity deep to the supraspinatus tendon footprint.

Other: None.
IMPRESSION: 1. Moderate to high-grade partial-thickness articular sided tearing
of the anterior greater than mid aspect of the supraspinatus tendon
footprint in a region measuring up to 2.6 cm in AP dimension.
2. Mild-to-moderate supraspinatus and anterior infraspinatus muscle
atrophy.
3. Moderate degenerative changes of the acromioclavicular joint.
4. Mild-to-moderate glenohumeral osteoarthritis.

## 2022-05-17 IMAGING — MR MR [PERSON_NAME] UP W/O CM*L*
6 series · 16 of 16 positions shown · non-contrast
Comparison: left shoulder radiographs [DATE]

CLINICAL DATA: Injured left shoulder and arm working [DATE] months
ago. Weakness since.

EXAM:
MRI BRACHIAL PLEXUS WITHOUT CONTRAST
TECHNIQUE: Multiplanar, multiecho pulse sequences of the neck and surrounding
structures were obtained without intravenous contrast. The field of
view was focused on the left/rightbrachial plexus from the neural
foramina to the axilla.

[Series 3: T2 fat-sat · axial · 3.0mm · 1.33mm/px · z∈[-189,-37]mm · 3 of 40 slices shown (1 of 2)]
[im 1/40]
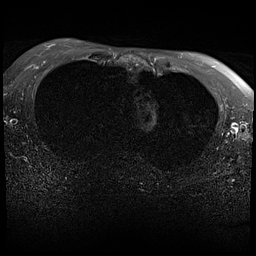
[im 20/40]
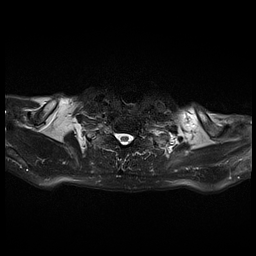
[im 40/40]
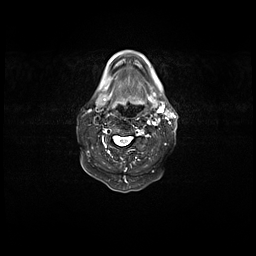

[Series 4: T1 · coronal · 4.0mm · 0.75mm/px · 2 of 38 slices shown (1 of 2)]
[im 1/38]
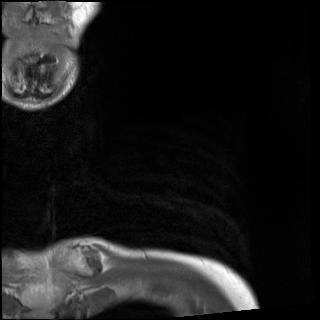
[im 38/38]
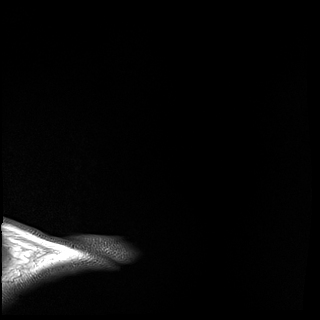

[Series 5: STIR · coronal · 4.0mm · 0.94mm/px · 2 of 40 slices shown (1 of 2)]
[im 1/40]
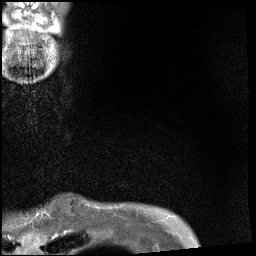
[im 40/40]
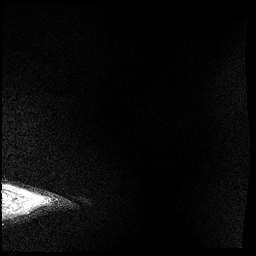

[Series 7: STIR · sagittal · 4.0mm · 0.94mm/px · 3 of 48 slices shown (2 of 2)]
[im 1/48]
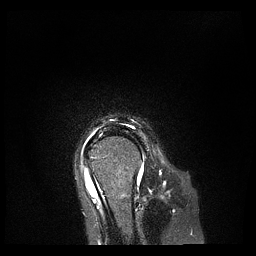
[im 24/48]
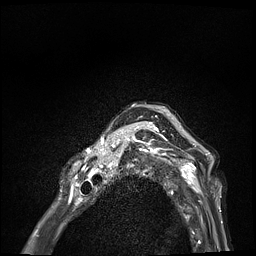
[im 48/48]
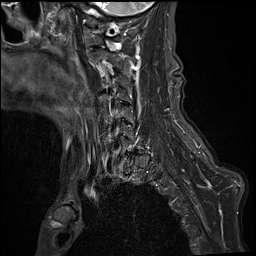

[Series 8: T2 fat-sat · axial · 3.0mm · 1.33mm/px · z∈[-192,-33]mm · 3 of 42 slices shown (2 of 2)]
[im 1/42]
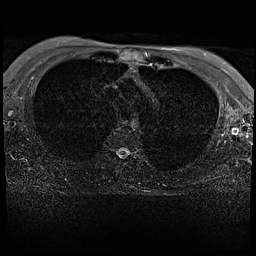
[im 21/42]
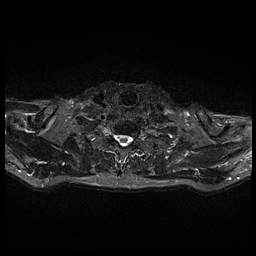
[im 42/42]
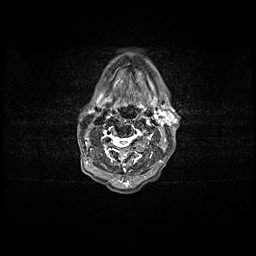

[Series 9: T1 · sagittal · 4.0mm · 0.75mm/px · 3 of 48 slices shown (2 of 2)]
[im 1/48]
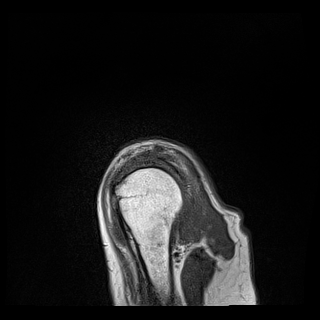
[im 24/48]
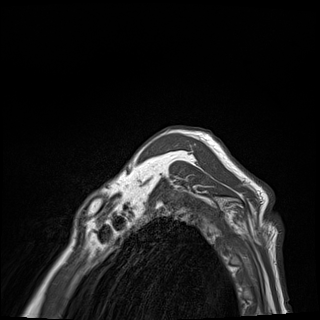
[im 48/48]
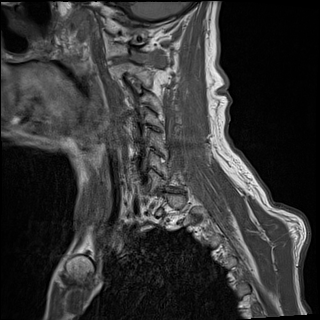

[16 of 16 positions shown; findings below may reference images not displayed]

FINDINGS: W:
FINDINGS: W
Despite efforts by the technologist and patient, motion artifact is
present on today's exam and could not be eliminated. This reduces
exam sensitivity and specificity.

Spinal cord

Normal signal and caliber of the visualized cervical and upper
thoracic spine.

Brachial plexus:

No abnormal edema is seen around the roots, trunks, divisions,
cords, or terminal branches of the left brachial plexus. No
obstructing mass is seen.

Muscles and tendons

The musculature around the left clavicle brachial plexus is grossly
unremarkable.

Bones

No acute fracture is seen within the left clavicle. Moderate
degenerative changes of the left acromioclavicular joint. Moderate
degenerative changes of the bilateral sternoclavicular joints.

Cervical spine/joints

Mild kyphotic angulation centered at C5. 3 mm grade 1
anterolisthesis of C7 on T1. Moderate to severe C3-4 through C6-7
disc space narrowing.

Mild-to-moderate C3-4 and C6-7 greater than C5-6 edematous marrow
endplate degenerative changes. Moderate anterior C3-4 through C6-7
endplate osteophytes with mild C5-6 and C4-5 chronic fat intensity
marrow endplate degenerative change.

The atlantodens interval is intact with moderate degenerative cystic
change. Moderate to severe T2-3 disc space narrowing. No acute
fracture. Vertebral body heights are maintained.

Mild levocurvature of the upper thoracic spine.

Other findings

Please see contemporaneous MRI of the left shoulder report for
evaluation of the left shoulder. Note is made of a mild glenohumeral
joint effusion.
IMPRESSION: 1. No abnormality is seen along the course of the left brachial
plexus.
2. Moderate degenerative changes of the left sternoclavicular and
acromioclavicular joints. No acute left clavicle fracture.

## 2022-05-23 ENCOUNTER — Other Ambulatory Visit: Payer: Self-pay | Admitting: Internal Medicine

## 2022-05-25 ENCOUNTER — Telehealth: Payer: Self-pay | Admitting: Family Medicine

## 2022-05-25 ENCOUNTER — Other Ambulatory Visit: Payer: Self-pay | Admitting: *Deleted

## 2022-05-25 MED ORDER — PANTOPRAZOLE SODIUM 40 MG PO TBEC: 40.0000 mg | DELAYED_RELEASE_TABLET | Freq: Every day | ORAL | 0 refills | Status: DC

## 2022-05-25 NOTE — Telephone Encounter (Signed)
Pt is completely out of  pantoprazole (PROTONIX) 40 MG tablet [[id number]]   Pharmacy  [id number] [id number] Phone:  [phone] Fax:  [phone]   Please advise and thank you

## 2022-05-25 NOTE — Telephone Encounter (Signed)
Medication has been filled for 30 days

## 2022-06-02 DIAGNOSIS — R29898 Other symptoms and signs involving the musculoskeletal system: Secondary | ICD-10-CM | POA: Diagnosis not present

## 2022-06-02 DIAGNOSIS — G8929 Other chronic pain: Secondary | ICD-10-CM | POA: Diagnosis not present

## 2022-06-02 DIAGNOSIS — M25512 Pain in left shoulder: Secondary | ICD-10-CM | POA: Diagnosis not present

## 2022-06-02 DIAGNOSIS — M5412 Radiculopathy, cervical region: Secondary | ICD-10-CM | POA: Diagnosis not present

## 2022-06-02 NOTE — Progress Notes (Signed)
CARDIOLOGY CONSULT NOTE       Patient ID: Ronald Lowe MRN: [id number] DOB/AGE: [birth year] 66 y.o.  Admit date: (Not on file) Referring Physician: Redmond Pulling Primary Physician: Dorna Mai, MD Primary Cardiologist: [name] Reason for Consultation: Afib   HPI:  66 y.o. referred by Dr Redmond Pulling on 11/19/21  for afib. History of smoking, HTN, HLD and CRF.He has muscle cramps with statin He was taking his eliquis daily not bid "felt weird when taking bid. Discussed with pharmacist.  12/20/20 Also no longer taking amiodarone   Hospitalized 06/18/21 with multi focal pneumonia and ETOH withdrawal Had PAF while withdrawing. Rx amiodarone and transitioned to 200 mg daily Baseline Cr 1.9 TTE with EF 50-55% trivial MR Mild LAE 3.9 cm  Was in NSR at D/c with plans to d/c amiodarone after 30 days   When seen in office 11/2021 was in afib Discussed med compliance and ETOH Seen by Malka So in [facility] 12/22/21 and was back in NSR   He hasn't taken any medication in weeks Long discussion with him about risk of lung cancer , cirrhosis  From his ETOH, and stroke from PAF not taking blood thinner He is also out of inhalers and needs f/u with [facility]   ROS All other systems reviewed and negative except as noted above  Past Medical History:  Diagnosis Date   Atrial fibrillation ([place])    Chronic kidney disease    Hyperlipidemia    Hypertension    Tobacco abuse     Family History  Problem Relation Age of Onset   Emphysema Father    CAD Father        CABG   Stroke Mother    CAD Brother 33       CABG   Hypertension Brother     Social History   Socioeconomic History   Marital status: Married    Spouse name: Not on file   Number of children: 2   Years of education: Not on file   Highest education level: Not on file  Occupational History   Occupation: retired  Tobacco Use   Smoking status: Former    Packs/day: 1.00    Years: 50.00    Total pack years: 50.00    Types: Cigarettes     Start date: 12/19/1970   Smokeless tobacco: Former  Scientific laboratory technician Use: Never used  Substance and Sexual Activity   Alcohol use: Not Currently    Alcohol/week: 42.0 standard drinks of alcohol    Types: 28 Cans of beer, 14 Shots of liquor per week    Comment: did not drink yesterday, was shaky today and wife gave him one shot pta   Drug use: No   Sexual activity: Yes    Partners: Female    Birth control/protection: None  Other Topics Concern   Not on file  Social History Narrative   Lives with girlfriend.     Social Determinants of Health   Financial Resource Strain: Low Risk  (09/12/2021)   Overall Financial Resource Strain (CARDIA)    Difficulty of Paying Living Expenses: Not hard at all  Food Insecurity: No Food Insecurity (09/12/2021)   Hunger Vital Sign    Worried About Running Out of Food in the Last Year: Never true    Ran Out of Food in the Last Year: Never true  Transportation Needs: No Transportation Needs (09/12/2021)   PRAPARE - Hydrologist (Medical): No  Lack of Transportation (Non-Medical): No  Physical Activity: Insufficiently Active (09/12/2021)   Exercise Vital Sign    Days of Exercise per Week: 3 days    Minutes of Exercise per Session: 20 min  Stress: No Stress Concern Present (09/12/2021)   [place]    Feeling of Stress : Not at all  Social Connections: Moderately Isolated (09/12/2021)   Social Connection and Isolation Panel [NHANES]    Frequency of Communication with Friends and Family: Twice a week    Frequency of Social Gatherings with Friends and Family: More than three times a week    Attends Religious Services: Never    Marine scientist or Organizations: Yes    Attends Archivist Meetings: Never    Marital Status: Separated  Intimate Partner Violence: Not At Risk (09/12/2021)   Humiliation, Afraid, Rape, and Kick questionnaire     Fear of Current or Ex-Partner: No    Emotionally Abused: No    Physically Abused: No    Sexually Abused: No    Past Surgical History:  Procedure Laterality Date   TONSILLECTOMY        Current Outpatient Medications:    albuterol (VENTOLIN HFA) 108 (90 Base) MCG/ACT inhaler, TAKE 2 PUFFS BY MOUTH EVERY 6 HOURS AS NEEDED FOR WHEEZE OR SHORTNESS OF BREATH, Disp: 18 each, Rfl: 1   amiodarone (PACERONE) 200 MG tablet, Take 1 tablet (200 mg total) by mouth daily., Disp: 90 tablet, Rfl: 2   amLODipine (NORVASC) 5 MG tablet, TAKE 1 TABLET (5 MG TOTAL) BY MOUTH EVERY OTHER DAY. DX: I10, Disp: 45 tablet, Rfl: 3   apixaban (ELIQUIS) 5 MG TABS tablet, TAKE 1 TABLET BY MOUTH TWICE A DAY, Disp: 180 tablet, Rfl: 1   atorvastatin (LIPITOR) 40 MG tablet, Take 1 tablet (40 mg total) by mouth every other day., Disp: , Rfl:    fluticasone-salmeterol (ADVAIR HFA) 45-21 MCG/ACT inhaler, INHALE 2 PUFFS INTO THE LUNGS TWICE A DAY, Disp: 12 each, Rfl: 12   lisinopril (ZESTRIL) 5 MG tablet, Take 1 tablet (5 mg total) by mouth every other day., Disp: 90 tablet, Rfl: 0   pantoprazole (PROTONIX) 40 MG tablet, Take 1 tablet (40 mg total) by mouth daily., Disp: 30 tablet, Rfl: 0   TRELEGY ELLIPTA 100-62.5-25 MCG/ACT AEPB, Take 1 puff by mouth daily., Disp: 1 each, Rfl: 0    Physical Exam: Blood pressure (!) 178/98, pulse 88, height 6' (1.829 m), weight 159 lb (72.1 kg), SpO2 97 %.    Affect appropriate Chronically ill male  HEENT: facial erythema  Neck supple with no adenopathy JVP normal no bruits no thyromegaly Lungs clear with no wheezing and good diaphragmatic motion Heart:  S1/S2 no murmur, no rub, gallop or click PMI normal Abdomen: benighn, BS positve, no tenderness, no AAA no bruit.  No HSM or HJR Distal pulses intact with no bruits No edema Neuro  tremulous no full blown DT;s  Skin warm and dry No muscular weakness   Labs:   Lab Results  Component Value Date   WBC 6.6 11/17/2021   HGB 15.8  11/17/2021   HCT 45.4 11/17/2021   MCV 98.7 11/17/2021   PLT 217 11/17/2021    No results for input(s): "NA", "K", "CL", "CO2", "BUN", "CREATININE", "CALCIUM", "PROT", "BILITOT", "ALKPHOS", "ALT", "AST", "GLUCOSE" in the last 168 hours.  Invalid input(s): "LABALBU"  Lab Results  Component Value Date   CKTOTAL 225 04/07/2021   CKMB 4.7 (  H) 06/12/2014    Lab Results  Component Value Date   CHOL 154 03/24/2021   CHOL 182 09/24/2019   CHOL 143 03/22/2010   Lab Results  Component Value Date   HDL 74 03/24/2021   HDL 68 09/24/2019   HDL 48 03/22/2010   Lab Results  Component Value Date   LDLCALC 66 03/24/2021   LDLCALC 99 09/24/2019   LDLCALC 83 03/22/2010   Lab Results  Component Value Date   TRIG 74 03/24/2021   TRIG 84 09/24/2019   TRIG 61 03/22/2010   Lab Results  Component Value Date   CHOLHDL 2.1 03/24/2021   CHOLHDL 2.7 09/24/2019   CHOLHDL 3.0 Ratio 03/22/2010   No results found for: "LDLDIRECT"    Radiology: MR BRACHIAL PLEXUS W/O CM LT  Result Date: 05/18/2022 CLINICAL DATA:  Injured left shoulder and arm working out 4 months ago. Weakness since. EXAM: MRI BRACHIAL PLEXUS WITHOUT CONTRAST TECHNIQUE: Multiplanar, multiecho pulse sequences of the neck and surrounding structures were obtained without intravenous contrast. The field of view was focused on the left/rightbrachial plexus from the neural foramina to the axilla. COMPARISON:  left shoulder radiographs 03/16/2022 FINDINGS:W: FINDINGS:W Despite efforts by the technologist and patient, motion artifact is present on today's exam and could not be eliminated. This reduces exam sensitivity and specificity. Spinal cord Normal signal and caliber of the visualized cervical and upper thoracic spine. Brachial plexus: No abnormal edema is seen around the roots, trunks, divisions, cords, or terminal branches of the left brachial plexus. No obstructing mass is seen. Muscles and tendons The musculature around the left  clavicle brachial plexus is grossly unremarkable. Bones No acute fracture is seen within the left clavicle. Moderate degenerative changes of the left acromioclavicular joint. Moderate degenerative changes of the bilateral sternoclavicular joints. Cervical spine/joints Mild kyphotic angulation centered at C5. 3 mm grade 1 anterolisthesis of C7 on T1. Moderate to severe C3-4 through C6-7 disc space narrowing. Mild-to-moderate C3-4 and C6-7 greater than C5-6 edematous marrow endplate degenerative changes. Moderate anterior C3-4 through C6-7 endplate osteophytes with mild C5-6 and C4-5 chronic fat intensity marrow endplate degenerative change. The atlantodens interval is intact with moderate degenerative cystic change. Moderate to severe T2-3 disc space narrowing. No acute fracture. Vertebral body heights are maintained. Mild levocurvature of the upper thoracic spine. Other findings Please see contemporaneous MRI of the left shoulder report for evaluation of the left shoulder. Note is made of a mild glenohumeral joint effusion. IMPRESSION: 1. No abnormality is seen along the course of the left brachial plexus. 2. Moderate degenerative changes of the left sternoclavicular and acromioclavicular joints. No acute left clavicle fracture. Electronically Signed   By: Yvonne Kendall M.D.   On: 05/18/2022 12:32   MR SHOULDER LEFT WO CONTRAST  Result Date: 05/18/2022 CLINICAL DATA:  Left shoulder and arm injury while working out 4 months ago. Weakness. EXAM: MRI OF THE LEFT SHOULDER WITHOUT CONTRAST TECHNIQUE: Multiplanar, multisequence MR imaging of the shoulder was performed. No intravenous contrast was administered. COMPARISON:  Left shoulder radiographs 03/16/2022 FINDINGS: Rotator cuff: There is increased T2 signal within the tendon footprint of the anterior 2.6 cm of the supraspinatus indicating multiple partial-thickness tears. This is greatest within the mid and anterior aspect of the supraspinatus tendon footprint  where there are multiple articular sided tendon tears (coronal series 7 images 14 through 16) involving up to approximately 75% of the transverse tendon dimension. Posterior to this, there is more mild articular sided tendon tearing (coronal series 7  images 12 and 13). Mild anterior infraspinatus tendinosis. The subscapularis and teres minor are intact. Muscles: Mild-to-moderate supraspinatus and anterior infraspinatus muscle atrophy. Biceps long head: The intra-articular long head of the biceps tendon is intact. Acromioclavicular Joint: There are moderate degenerative changes of the acromioclavicular joint including joint space narrowing, subchondral marrow edema, and peripheral osteophytosis. Type II acromion. Mild-to-moderate subacromial/subdeltoid bursitis. Glenohumeral Joint: Moderate thinning of the glenoid and humeral head cartilage. Mild peripheral inferior glenoid degenerative osteophytosis. Mild posteroinferior glenoid subchondral marrow edema. Labrum: Mild degenerative attenuation and irregularity of the posterosuperior glenoid labrum. Bones: Moderate subcortical marrow edema and cystic change within the greater tuberosity deep to the supraspinatus tendon footprint. Other: None. IMPRESSION: 1. Moderate to high-grade partial-thickness articular sided tearing of the anterior greater than mid aspect of the supraspinatus tendon footprint in a region measuring up to 2.6 cm in AP dimension. 2. Mild-to-moderate supraspinatus and anterior infraspinatus muscle atrophy. 3. Moderate degenerative changes of the acromioclavicular joint. 4. Mild-to-moderate glenohumeral osteoarthritis. Electronically Signed   By: Yvonne Kendall M.D.   On: 05/18/2022 12:19    EKG: 12/22/21 SR rate 84 early repolarization    ASSESSMENT AND PLAN:   PAF:  June 2022 in setting of ETOH withdrawal and pneumonia converted to NSR on amiodarone now d/c Supposed to be on Eliquis  CHADVASC 2  11/2021 Back in afib ? Related to Flu  Spontaneous  conversion continue amiodarone and eliquis Labs in 4 weeks after restarting meds  Smoking : counseled on cessation No cancer on CTA 06/14/21 F/U CT lung cancer screening per [facility] June 2023 Has smoked 1-2 ppd for over 50 years  HTN:  uncontrolled due to non compliance  HLD:  resume statin  ETOH:  counseled on moderation f/u primary ? Needing outpatient valium  at risk for cirrhosis   CBC/PLT/BMET for eliquis TSH and LFTls for amiodarone  Non contrast CT smoking    F/U in  3 months   Signed: Jenkins Rouge 06/03/2022, 9:05 AM

## 2022-06-03 ENCOUNTER — Ambulatory Visit (INDEPENDENT_AMBULATORY_CARE_PROVIDER_SITE_OTHER): Payer: Medicare HMO | Admitting: Cardiovascular Disease

## 2022-06-03 ENCOUNTER — Encounter: Payer: Self-pay | Admitting: Cardiovascular Disease

## 2022-06-03 VITALS — BP 178/98 | HR 88 | Ht 72.0 in | Wt 159.0 lb

## 2022-06-03 DIAGNOSIS — D6869 Other thrombophilia: Secondary | ICD-10-CM | POA: Diagnosis not present

## 2022-06-03 DIAGNOSIS — I4891 Unspecified atrial fibrillation: Secondary | ICD-10-CM | POA: Diagnosis not present

## 2022-06-03 DIAGNOSIS — F17200 Nicotine dependence, unspecified, uncomplicated: Secondary | ICD-10-CM

## 2022-06-03 DIAGNOSIS — I10 Essential (primary) hypertension: Secondary | ICD-10-CM

## 2022-06-03 DIAGNOSIS — J441 Chronic obstructive pulmonary disease with (acute) exacerbation: Secondary | ICD-10-CM

## 2022-06-03 DIAGNOSIS — I480 Paroxysmal atrial fibrillation: Secondary | ICD-10-CM

## 2022-06-03 MED ORDER — AMLODIPINE BESYLATE 5 MG PO TABS: 5.0000 mg | ORAL_TABLET | Freq: Every day | ORAL | 3 refills | Status: DC

## 2022-06-03 MED ORDER — LISINOPRIL 5 MG PO TABS: 5.0000 mg | ORAL_TABLET | Freq: Every day | ORAL | 3 refills | Status: DC

## 2022-06-03 MED ORDER — ATORVASTATIN CALCIUM 40 MG PO TABS: 40.0000 mg | ORAL_TABLET | Freq: Every day | ORAL | 3 refills | Status: DC

## 2022-06-03 MED ORDER — APIXABAN 5 MG PO TABS: 5.0000 mg | ORAL_TABLET | Freq: Two times a day (BID) | ORAL | 3 refills | Status: DC

## 2022-06-03 MED ORDER — AMIODARONE HCL 200 MG PO TABS: 200.0000 mg | ORAL_TABLET | Freq: Every day | ORAL | 3 refills | Status: DC

## 2022-06-03 NOTE — Patient Instructions (Signed)
Medication Instructions:  Your physician recommends that you continue on your current medications as directed. Please refer to the Current Medication list given to you today.  *If you need a refill on your cardiac medications before your next appointment, please call your pharmacy*   Lab Work: Your physician recommends that you return for lab work in: 4 weeks for fasting CMET, CBC TSH, and Lipid  If you have labs (blood work) drawn today and your tests are completely normal, you will receive your results only by: [place] (if you have MyChart) OR A paper copy in the mail If you have any lab test that is abnormal or we need to change your treatment, we will call you to review the results.  Testing/Procedures: Non-Cardiac CT scanning lung cancer screening (CAT scanning), is a noninvasive, special x-ray that produces cross-sectional images of the body using x-rays and a computer. CT scans help physicians diagnose and treat medical conditions. For some CT exams, a contrast material is used to enhance visibility in the area of the body being studied. CT scans provide greater clarity and reveal more details than regular x-ray exams.  Follow-Up: At [facility], you and your health needs are our priority.  As part of our continuing mission to provide you with exceptional heart care, we have created designated Provider Care Teams.  These Care Teams include your primary Cardiologist (physician) and Advanced Practice Providers (APPs -  Physician Assistants and Nurse Practitioners) who all work together to provide you with the care you need, when you need it.  We recommend signing up for the patient portal called "MyChart".  Sign up information is provided on this After Visit Summary.  MyChart is used to connect with patients for Virtual Visits (Telemedicine).  Patients are able to view lab/test results, encounter notes, upcoming appointments, etc.  Non-urgent messages can be sent to your provider  as well.   To learn more about what you can do with MyChart, go to [email or web address].    Your next appointment:   3 month(s)  The format for your next appointment:   In Person  Provider:   Jenkins Rouge, MD {  You have been referred to Dr. Shearon Stalls at Pulmonary.  Important Information About Sugar

## 2022-06-13 ENCOUNTER — Inpatient Hospital Stay: Admission: RE | Admit: 2022-06-13 | Payer: Medicare HMO | Source: Ambulatory Visit

## 2022-06-17 ENCOUNTER — Other Ambulatory Visit: Payer: Self-pay | Admitting: Family Medicine

## 2022-06-17 NOTE — Telephone Encounter (Signed)
Requested Prescriptions  Pending Prescriptions Disp Refills  . pantoprazole (PROTONIX) 40 MG tablet [Pharmacy Med Name: PANTOPRAZOLE SOD DR 40 MG TAB] 90 tablet 2    Sig: TAKE 1 TABLET BY MOUTH EVERY DAY     Gastroenterology: Proton Pump Inhibitors Passed - 06/17/2022 12:33 PM      Passed - Valid encounter within last 12 months    Recent Outpatient Visits          3 months ago Essential hypertension   [facility] at [facility], MD   4 months ago Tendinitis of left shoulder   Primary [facility] at [facility], Cari S, PA-C   6 months ago Dysthymia   [facility] at [facility], MD   7 months ago Atrial fibrillation with rapid ventricular response [facility])   [facility] at [facility], MD   8 months ago Medication management   [place], RPH-CPP      Future Appointments            In 3 months [name], Wallis Bamberg, MD [place], [facility]

## 2022-06-20 ENCOUNTER — Institutional Professional Consult (permissible substitution): Payer: Medicare HMO | Admitting: Internal Medicine

## 2022-06-24 ENCOUNTER — Telehealth: Payer: Self-pay | Admitting: Cardiovascular Disease

## 2022-06-24 NOTE — Telephone Encounter (Signed)
Patient states that he is returning call. Please advise

## 2022-07-01 ENCOUNTER — Other Ambulatory Visit: Payer: Medicare HMO

## 2022-07-01 DIAGNOSIS — I4891 Unspecified atrial fibrillation: Secondary | ICD-10-CM | POA: Diagnosis not present

## 2022-07-01 DIAGNOSIS — J441 Chronic obstructive pulmonary disease with (acute) exacerbation: Secondary | ICD-10-CM | POA: Diagnosis not present

## 2022-07-01 DIAGNOSIS — I480 Paroxysmal atrial fibrillation: Secondary | ICD-10-CM | POA: Diagnosis not present

## 2022-07-01 DIAGNOSIS — D6869 Other thrombophilia: Secondary | ICD-10-CM

## 2022-07-01 DIAGNOSIS — I10 Essential (primary) hypertension: Secondary | ICD-10-CM

## 2022-07-01 DIAGNOSIS — F17200 Nicotine dependence, unspecified, uncomplicated: Secondary | ICD-10-CM

## 2022-07-02 LAB — COMPREHENSIVE METABOLIC PANEL
ALT: 33 IU/L (ref 0–44)
AST: 45 IU/L — ABNORMAL HIGH (ref 0–40)
Albumin/Globulin Ratio: 1.7 (ref 1.2–2.2)
Albumin: 4.5 g/dL (ref 3.9–4.9)
Alkaline Phosphatase: 70 IU/L (ref 44–121)
BUN/Creatinine Ratio: 7 — ABNORMAL LOW (ref 10–24)
BUN: 7 mg/dL — ABNORMAL LOW (ref 8–27)
Bilirubin Total: 0.8 mg/dL (ref 0.0–1.2)
CO2: 25 mmol/L (ref 20–29)
Calcium: 9.3 mg/dL (ref 8.6–10.2)
Chloride: 96 mmol/L (ref 96–106)
Creatinine, Ser: 0.94 mg/dL (ref 0.76–1.27)
Globulin, Total: 2.6 g/dL (ref 1.5–4.5)
Glucose: 93 mg/dL (ref 70–99)
Potassium: 4.2 mmol/L (ref 3.5–5.2)
Sodium: 133 mmol/L — ABNORMAL LOW (ref 134–144)
Total Protein: 7.1 g/dL (ref 6.0–8.5)
eGFR: 89 mL/min/{1.73_m2} (ref >59)

## 2022-07-02 LAB — LIPID PANEL
Chol/HDL Ratio: 2 ratio (ref 0.0–5.0)
Cholesterol, Total: 137 mg/dL (ref 100–199)
HDL: 69 mg/dL (ref >39)
LDL Chol Calc (NIH): 54 mg/dL (ref 0–99)
Triglycerides: 67 mg/dL (ref 0–149)
VLDL Cholesterol Cal: 14 mg/dL (ref 5–40)

## 2022-07-02 LAB — CBC WITH DIFFERENTIAL/PLATELET
Basophils Absolute: 0.1 10*3/uL (ref 0.0–0.2)
Basos: 1 %
EOS (ABSOLUTE): 0.2 10*3/uL (ref 0.0–0.4)
Eos: 3 %
Hematocrit: 45.1 % (ref 37.5–51.0)
Hemoglobin: 15.8 g/dL (ref 13.0–17.7)
Immature Grans (Abs): 0.1 10*3/uL (ref 0.0–0.1)
Immature Granulocytes: 1 %
Lymphocytes Absolute: 0.8 10*3/uL (ref 0.7–3.1)
Lymphs: 15 %
MCH: 35.9 pg — ABNORMAL HIGH (ref 26.6–33.0)
MCHC: 35 g/dL (ref 31.5–35.7)
MCV: 103 fL — ABNORMAL HIGH (ref 79–97)
Monocytes Absolute: 0.7 10*3/uL (ref 0.1–0.9)
Monocytes: 12 %
Neutrophils Absolute: 3.7 10*3/uL (ref 1.4–7.0)
Neutrophils: 68 %
Platelets: 280 10*3/uL (ref 150–450)
RBC: 4.4 x10E6/uL (ref 4.14–5.80)
RDW: 13.7 % (ref 11.6–15.4)
WBC: 5.6 10*3/uL (ref 3.4–10.8)

## 2022-07-02 LAB — TSH: TSH: 0.872 u[IU]/mL (ref 0.450–4.500)

## 2022-07-06 ENCOUNTER — Ambulatory Visit: Payer: Medicare HMO | Attending: Neurosurgery

## 2022-07-06 ENCOUNTER — Other Ambulatory Visit: Payer: Self-pay

## 2022-07-06 DIAGNOSIS — M6281 Muscle weakness (generalized): Secondary | ICD-10-CM | POA: Insufficient documentation

## 2022-07-06 DIAGNOSIS — M25612 Stiffness of left shoulder, not elsewhere classified: Secondary | ICD-10-CM | POA: Diagnosis not present

## 2022-07-06 DIAGNOSIS — G8929 Other chronic pain: Secondary | ICD-10-CM | POA: Insufficient documentation

## 2022-07-06 DIAGNOSIS — M25512 Pain in left shoulder: Secondary | ICD-10-CM | POA: Insufficient documentation

## 2022-07-06 NOTE — Therapy (Signed)
OUTPATIENT PHYSICAL THERAPY SHOULDER EVALUATION   Patient Name: Ronald Lowe MRN: [id number] DOB:11/13/1956, 66 y.o., male Today's Date: 07/07/2022   PT End of Session - 07/07/22 1102     Visit Number 1    Number of Visits 13    Date for PT Re-Evaluation 08/26/22    Authorization Type HUMANA MEDICARE HMO; MEDICAID OF [place]    PT Start Time 1422    PT Stop Time 1505    PT Time Calculation (min) 43 min    Activity Tolerance Patient tolerated treatment well    Behavior During Therapy WFL for tasks assessed/performed             Past Medical History:  Diagnosis Date   Atrial fibrillation ([place])    Chronic kidney disease    Hyperlipidemia    Hypertension    Tobacco abuse    Past Surgical History:  Procedure Laterality Date   TONSILLECTOMY     Patient Active Problem List   Diagnosis Date Noted   Foraminal stenosis of cervical region 03/23/2022   Left arm weakness 03/23/2022   Paroxysmal atrial fibrillation ([place]) 12/22/2021   Secondary hypercoagulable state ([place]) 12/22/2021   Medication management 10/20/2021   Chronic obstructive pulmonary disease ([place]) 10/19/2021   Alcohol withdrawal delirium ([place]) 06/17/2021   Atrial fibrillation with rapid ventricular response ([place]) 06/17/2021   Macrocytic anemia 06/17/2021   COPD exacerbation ([place]) 06/14/2021   Multifocal pneumonia    CAP (community acquired pneumonia) 06/13/2021   Emphysema lung ([place]) 03/17/2021   Positive hepatitis C antibody test 10/02/2019   Tobacco dependence 09/24/2019   Alcohol use disorder, mild, abuse 09/24/2019   Orthostatic hypotension 06/12/2014   EKG abnormality 06/12/2014   Elevated blood pressure reading in office with diagnosis of hypertension 06/12/2014    PCP: Dorna Mai, MD  REFERRING PROVIDER: Meade Maw, MD  REFERRING DIAG:  [phone] (ICD-10-CM) - Pain in left shoulder  G89.29 (ICD-10-CM) - Other chronic pain    THERAPY DIAG:  Muscle weakness (generalized)  Chronic  left shoulder pain  Stiffness of left shoulder, not elsewhere classified  Rationale for Evaluation and Treatment Rehabilitation  ONSET DATE: 4 months ago  SUBJECTIVE:                                                                                                                                                                                      SUBJECTIVE STATEMENT: Pt reports he injuried his L shoulder when weight lifting, completing bicep curls and overhead presses for a significant amount of reps. Pt reports he needs to assist his L arm with the r arm when lifting it above his shoulder  PERTINENT HISTORY:  Not relevant  PAIN:  Are you having pain? Yes: NPRS scale: 2/10 Pain location: L shoulder Pain description: aches Aggravating factors: Certain arm movements Relieving factors: 3 Advils or tylenol  PRECAUTIONS: None  WEIGHT BEARING RESTRICTIONS No  FALLS:  Has patient fallen in last 6 months? 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 Moderate to high-grade partial-thickness articular sided tearing of the anterior greater than mid aspect of the supraspinatus tendon footprint in a region measuring up to 2.6 cm in AP dimension. 2. Mild-to-moderate supraspinatus and anterior infraspinatus muscle atrophy. 3. Moderate degenerative changes of the acromioclavicular joint. 4. Mild-to-moderate glenohumeral osteoarthritis.    Cervical MRI 03/18/22 IMPRESSION:  1. Severe foraminal stenosis bilaterally at C3-C4, C5-C6 and C6-C7  and on the left at C4-C5. Milder multilevel foraminal stenosis is  detailed above.  2. Moderate canal stenosis at C5-C6 and C6-C7 and mild-to-moderate   canal stenosis at C3-C4.    PATIENT SURVEYS:  FOTO L shoulder TBA  COGNITION:  Overall cognitive status: Within functional limits for tasks assessed     SENSATION: WFL  POSTURE: Forward head, rounded shoulders  UPPER EXTREMITY ROM:   AROM/AAROM ROM Right eval Left eval  Shoulder flexion 135 AROM 60 AROM/ 120 PROM  Shoulder extension    Shoulder abduction    Shoulder adduction    Shoulder internal rotation    Shoulder external rotation    Elbow flexion    Elbow extension    Wrist flexion    Wrist extension    Wrist ulnar deviation    Wrist radial deviation    Wrist pronation    Wrist supination    (Blank rows = not tested)  UPPER EXTREMITY MMT:  MMT Right eval Left eval  Shoulder flexion 5 2/5  Shoulder extension 5 3/5  Shoulder abduction 5 2/5  Shoulder adduction 5 4+/5  Shoulder internal rotation 5 4+/5  Shoulder external rotation 5 2/5  Middle trapezius    Lower trapezius    Elbow flexion    Elbow extension    Wrist flexion    Wrist extension    Wrist ulnar deviation    Wrist radial deviation    Wrist pronation    Wrist supination    Grip strength (lbs)    (Blank rows = not tested)  SHOULDER SPECIAL TESTS:  Impingement tests: Hawkins/Kennedy impingement test: negative  Rotator cuff assessment: Drop arm test: positive , Empty can test: positive , and Full can test: positive   All tests were positive for weakness.  Biceps assessment: Speed's Test: Positive for weakness   PALPATION/Observation:  Significant atrophy of the l GH musclutature   TODAY'S TREATMENT:  No treatment was provided  PATIENT EDUCATION: Education details: Eval findings, POC Person educated: Patient Education method: Explanation, Demonstration, Tactile cues, Verbal cues, and Handouts Education comprehension: verbalized understanding, returned demonstration, verbal cues required, and tactile cues required   HOME EXERCISE PROGRAM: To be  provided  ASSESSMENT:  CLINICAL IMPRESSION: Patient is a 66 y.o. M who was seen today for physical therapy evaluation and treatment for  Pain in left shoulder, and Other chronic pain. Pt presents with signifant weakness of the L shoulder with observable atrophy. Se MRI.  OBJECTIVE IMPAIRMENTS decreased ROM, decreased strength, impaired UE functional use, and pain.   ACTIVITY LIMITATIONS carrying, lifting, dressing, and reach over head  PARTICIPATION LIMITATIONS: cleaning, driving, and overhead activities  PERSONAL FACTORS Past/current experiences and Time since onset of injury/illness/exacerbation are also affecting patient's functional outcome.   REHAB POTENTIAL: Fair due to chronicity and degree of weakness   CLINICAL DECISION MAKING: Evolving/moderate complexity  EVALUATION COMPLEXITY: Moderate   GOALS:   SHORT TERM GOALS= LTGs  LONG TERM GOALS: Target date: 08/26/22   Pt will demonstrate 3/5 strength of the L shoulder and biceps for L arm above shoulder reaching without weight and to assist with 2 handed above shoulder lifting with the R arm with a box up to 20# Baseline: Not able to raise the R arm Goal status: INITIAL  2.  Pt will demonstrate 100d of AROM for above shoulder function of the L UE Baseline: 60d Goal status: INITIAL  3.  Pt will report a decrease in L shoulder pain to 1/10 intermittently for improved function and QOL Baseline: 2/10 Goal status: INITIAL  4.  Pt will be Ind in a final HEP to maintain achieved LOF Baseline: To be initiated Goal status: INITIAL   PLAN: PT FREQUENCY: 2x/week  PT DURATION: 6 weeks  PLANNED INTERVENTIONS: Therapeutic exercises, Therapeutic activity, Patient/Family education, Self Care, Joint mobilization, Aquatic Therapy, Dry Needling, Cryotherapy, Moist heat, Taping, Ionotophoresis '4mg'$ /ml Dexamethasone, Manual therapy, and Re-evaluation  PLAN FOR NEXT SESSION: Complete FOTO for the L shoulder, Initiate therex and  HEP   Arvis Zwahlen MS, PT 07/07/22 1:35 PM   Referring diagnosis? Pain in left shoulder, Other chronic pain Treatment diagnosis? (if different than referring diagnosis) Muscle weakness (generalized), Chronic left shoulder pain, Stiffness of left shoulder, not elsewhere classified  What was this (referring dx) caused by? '[]'$  Surgery '[]'$  Fall '[x]'$  Ongoing issue '[]'$  Arthritis '[x]'$  Other: _Injury__________  Laterality: '[]'$  Rt '[x]'$  Lt '[]'$  Both  Check all possible CPT codes:  *CHOOSE 10 OR LESS*    '[x]'$  97110 (Therapeutic Exercise)  '[]'$  92507 (SLP Treatment)  '[]'$  97112 (Neuro Re-ed)   '[]'$  92526 (Swallowing Treatment)   '[]'$  97116 (Gait Training)   '[]'$  [id number] (Cognitive Training, 1st 15 minutes) '[x]'$  97140 (Manual Therapy)   '[]'$  97130 (Cognitive Training, each add'l 15 minutes)  '[x]'$  97164 (Re-evaluation)                              '[]'$  Other, List CPT Code ____________  '[x]'$  29924 (Therapeutic Activities)     '[x]'$  97535 (Self Care)   '[]'$  All codes above (97110 - 97535)  '[]'$  97012 (Mechanical Traction)  '[x]'$  97014 (E-stim Unattended)  '[x]'$  97032 (E-stim manual)  '[x]'$  97033 (Ionto)  '[x]'$  97035 (Ultrasound) '[]'$  97750 (Physical Performance Training) '[x]'$  [id number] (Aquatic Therapy) '[]'$  97016 (Vasopneumatic Device) '[]'$  [id number] (Paraffin) '[]'$  97034 (Contrast Bath) '[]'$  97597 (Wound Care 1st 20 sq cm) '[]'$  97598 (Wound Care each add'l 20 sq cm) '[]'$  97760 (Orthotic Fabrication, Fitting, Training Initial) '[]'$  [id number] (Prosthetic Management and Training Initial) '[]'$  [id number] (Orthotic or Prosthetic Training/ Modification Subsequent)

## 2022-07-07 ENCOUNTER — Other Ambulatory Visit: Payer: Self-pay

## 2022-07-07 NOTE — Patient Outreach (Signed)
[facility]) Care Management  07/07/2022  Ronald Lowe February 06, 1956 [id number]   Telephone call to patient for nurse call.  No answer.  Unable to leave a message.  Plan: RN CM will attempt again within 4 business days and send letter.  [name], RN, MSN [facility] Care Management Care Management Coordinator Direct Line [phone] Toll Free: [phone]  Fax: [phone]

## 2022-07-11 ENCOUNTER — Other Ambulatory Visit: Payer: Self-pay

## 2022-07-11 NOTE — Patient Outreach (Signed)
[facility]) Care Management  07/11/2022  Ronald Lowe 01/17/1956 [id number]   Telephone call to patient for nurse call. Patient reports managing well.  Discused [facility] services. Patient agreeable to nuse calls.    Care Plan : RN CM Plan of Care  Updates made by Jon Billings, RN since 07/11/2022 12:00 AM     Problem: Chronic Disease Management and Care Coordination Needs of hypertension   Priority: High     Long-Range Goal: Development of Plan of Care for Management of Hypertension   Start Date: 07/11/2022  Expected End Date: 12/18/2022  Priority: High  Note:   Current Barriers:  Chronic Disease Management support and education needs related to HTN   RNCM Clinical Goal(s):  Patient will verbalize basic understanding of  HTN disease process and self health management plan as evidenced by blood pressure reports less than 140/80  through collaboration with RN Care manager, provider, and care team.   Interventions: Education and support related to hypertension Inter-disciplinary care team collaboration (see longitudinal plan of care) Evaluation of current treatment plan related to  self management and patient's adherence to plan as established by provider   Hypertension Interventions:  (Status:  New goal.) Long Term Goal Last practice recorded BP readings:  BP Readings from Last 3 Encounters:  06/03/22 (!) 178/98  04/12/22 109/75  03/23/22 (!) 158/101  Most recent eGFR/CrCl:  Lab Results  Component Value Date   EGFR 89 07/01/2022    No components found for: "CRCL"  Evaluation of current treatment plan related to hypertension self management and patient's adherence to plan as established by provider Reviewed medications with patient and discussed importance of compliance  Patient Goals/Self-Care Activities: check blood pressure 3 times per week take blood pressure log to all doctor appointments call doctor for signs and symptoms of high blood  pressure  Follow Up Plan:  Telephone follow up appointment with care management team member scheduled for:  October The patient has been provided with contact information for the care management team and has been advised to call with any health related questions or concerns.      Plan: RN CM will provide ongoing education and support to patient through phone calls.   RN CM will send welcome packet with consent to patient.   RN CM will send initial barriers letter, assessment, and care plan to primary care physician.   RN CM will contact patient in October and patient agrees to next contact and care plan.  [name], RN, MSN [facility] Care Management Care Management Coordinator Direct Line [phone] Toll Free: [phone]  Fax: [phone]

## 2022-07-11 NOTE — Patient Instructions (Signed)
Patient Goals/Self-Care Activities: check blood pressure 3 times per week take blood pressure log to all doctor appointments call doctor for signs and symptoms of high blood pressure

## 2022-07-18 ENCOUNTER — Ambulatory Visit: Payer: Medicare HMO | Admitting: Physical Therapy

## 2022-07-18 ENCOUNTER — Ambulatory Visit
Admission: RE | Admit: 2022-07-18 | Discharge: 2022-07-18 | Disposition: A | Discharge status: Home / Self Care | Payer: Medicare HMO | Source: Ambulatory Visit | Attending: Cardiovascular Disease | Admitting: Cardiovascular Disease

## 2022-07-18 ENCOUNTER — Encounter: Payer: Self-pay | Admitting: Physical Therapy

## 2022-07-18 DIAGNOSIS — G8929 Other chronic pain: Secondary | ICD-10-CM | POA: Diagnosis not present

## 2022-07-18 DIAGNOSIS — I10 Essential (primary) hypertension: Secondary | ICD-10-CM

## 2022-07-18 DIAGNOSIS — F17200 Nicotine dependence, unspecified, uncomplicated: Secondary | ICD-10-CM

## 2022-07-18 DIAGNOSIS — M25512 Pain in left shoulder: Secondary | ICD-10-CM | POA: Diagnosis not present

## 2022-07-18 DIAGNOSIS — J441 Chronic obstructive pulmonary disease with (acute) exacerbation: Secondary | ICD-10-CM

## 2022-07-18 DIAGNOSIS — I4891 Unspecified atrial fibrillation: Secondary | ICD-10-CM

## 2022-07-18 DIAGNOSIS — M6281 Muscle weakness (generalized): Secondary | ICD-10-CM | POA: Diagnosis not present

## 2022-07-18 DIAGNOSIS — F17210 Nicotine dependence, cigarettes, uncomplicated: Secondary | ICD-10-CM | POA: Diagnosis not present

## 2022-07-18 DIAGNOSIS — D6869 Other thrombophilia: Secondary | ICD-10-CM

## 2022-07-18 DIAGNOSIS — M25612 Stiffness of left shoulder, not elsewhere classified: Secondary | ICD-10-CM

## 2022-07-18 DIAGNOSIS — I480 Paroxysmal atrial fibrillation: Secondary | ICD-10-CM

## 2022-07-18 NOTE — Therapy (Signed)
OUTPATIENT PHYSICAL THERAPY TREATMENT NOTE   Patient Name: Ronald Lowe MRN: [id number] DOB:10-11-1956, 66 y.o., male Today's Date: 07/18/2022  PCP: Dorna Mai, MD   REFERRING PROVIDER: Meade Maw, MD  END OF SESSION:   PT End of Session - 07/18/22 1033     Visit Number 2    Number of Visits 13    Date for PT Re-Evaluation 08/26/22    Authorization Type HUMANA MEDICARE HMO; MEDICAID OF [place]    Authorization Time Period 07/07/22-08/20/22; 12 visits    Authorization - Visit Number 1    Authorization - Number of Visits 12    PT Start Time 1030   pt late   PT Stop Time 1100    PT Time Calculation (min) 30 min             Past Medical History:  Diagnosis Date   Atrial fibrillation ([place])    Chronic kidney disease    Hyperlipidemia    Hypertension    Tobacco abuse    Past Surgical History:  Procedure Laterality Date   TONSILLECTOMY     Patient Active Problem List   Diagnosis Date Noted   Foraminal stenosis of cervical region 03/23/2022   Left arm weakness 03/23/2022   Paroxysmal atrial fibrillation ([place]) 12/22/2021   Secondary hypercoagulable state ([place]) 12/22/2021   Medication management 10/20/2021   Chronic obstructive pulmonary disease ([place]) 10/19/2021   Alcohol withdrawal delirium ([place]) 06/17/2021   Atrial fibrillation with rapid ventricular response ([place]) 06/17/2021   Macrocytic anemia 06/17/2021   COPD exacerbation ([place]) 06/14/2021   Multifocal pneumonia    CAP (community acquired pneumonia) 06/13/2021   Emphysema lung ([place]) 03/17/2021   Positive hepatitis C antibody test 10/02/2019   Tobacco dependence 09/24/2019   Alcohol use disorder, mild, abuse 09/24/2019   Orthostatic hypotension 06/12/2014   EKG abnormality 06/12/2014   Elevated blood pressure reading in office with diagnosis of hypertension 06/12/2014    REFERRING DIAG:  M25.512 (ICD-10-CM) - Pain in left shoulder  G89.29 (ICD-10-CM) - Other chronic pain    THERAPY DIAG:  Muscle  weakness (generalized)  Chronic left shoulder pain  Stiffness of left shoulder, not elsewhere classified  Rationale for Evaluation and Treatment Rehabilitation  PERTINENT HISTORY: Not relevant   PRECAUTIONS: None  SUBJECTIVE: I did not get any exercises.   PAIN:  Are you having pain? Yes: NPRS scale: 2/10 Pain location: L shoulder Pain description: aches Aggravating factors: Certain arm movements Relieving factors: 3 Advils or tylenol   OBJECTIVE: (objective measures completed at initial evaluation unless otherwise dated)   DIAGNOSTIC FINDINGS:  L Shoulder MRI 05/18/22 IMPRESSION: 1. Moderate to high-grade partial-thickness articular sided tearing of the anterior greater than mid aspect of the supraspinatus tendon footprint in a region measuring up to 2.6 cm in AP dimension. 2. Mild-to-moderate supraspinatus and anterior infraspinatus muscle atrophy. 3. Moderate degenerative changes of the acromioclavicular joint. 4. Mild-to-moderate glenohumeral osteoarthritis.    Cervical MRI 03/18/22 IMPRESSION:  1. Severe foraminal stenosis bilaterally at C3-C4, C5-C6 and C6-C7  and on the left at C4-C5. Milder multilevel foraminal stenosis is  detailed above.  2. Moderate canal stenosis at C5-C6 and C6-C7 and mild-to-moderate  canal stenosis at C3-C4.      PATIENT SURVEYS:  FOTO L shoulder TBA   COGNITION:           Overall cognitive status: Within functional limits for tasks assessed  SENSATION: WFL   POSTURE: Forward head, rounded shoulders   UPPER EXTREMITY ROM:    AROM/AAROM ROM Right eval Left eval  Shoulder flexion 135 AROM 60 AROM/ 120 PROM  Shoulder extension      Shoulder abduction      Shoulder adduction      Shoulder internal rotation      Shoulder external rotation      Elbow flexion      Elbow extension      Wrist flexion      Wrist extension      Wrist ulnar deviation      Wrist radial deviation      Wrist  pronation      Wrist supination      (Blank rows = not tested)   UPPER EXTREMITY MMT:   MMT Right eval Left eval  Shoulder flexion 5 2/5  Shoulder extension 5 3/5  Shoulder abduction 5 2/5  Shoulder adduction 5 4+/5  Shoulder internal rotation 5 4+/5  Shoulder external rotation 5 2/5  Middle trapezius      Lower trapezius      Elbow flexion      Elbow extension      Wrist flexion      Wrist extension      Wrist ulnar deviation      Wrist radial deviation      Wrist pronation      Wrist supination      Grip strength (lbs)      (Blank rows = not tested)   SHOULDER SPECIAL TESTS:            Impingement tests: Hawkins/Kennedy impingement test: negative            Rotator cuff assessment: Drop arm test: positive , Empty can test: positive , and Full can test: positive             All tests were positive for weakness.            Biceps assessment: Speed's Test: Positive for weakness    PALPATION/Observation:  Significant atrophy of the l GH musclutature             TODAY'S TREATMENT:  [facility] Adult PT Treatment:                                                DATE: 07/18/22 Therapeutic Exercise: Shoulder pulleys Standing shoulder wall slide x 10- increased pain after 6-8 reps Seated shoulder flexion table slides x 10 Seated shoulder abduction table slides x 10  Seated shoulder ER/IR table slides x 10 Seated Assisted bicep curls 10 x 2  Seated elbow supination  10 x 2     [facility] Adult PT Treatment:                                                DATE: 07/07/22 No treatment was provided   PATIENT EDUCATION: Education details: Eval findings, POC Person educated: Patient Education method: Explanation, Demonstration, Tactile cues, Verbal cues, and Handouts Education comprehension: verbalized understanding, returned demonstration, verbal cues required, and tactile cues required     HOME EXERCISE PROGRAM: To be provided   ASSESSMENT:   CLINICAL IMPRESSION: Patient is a 66  y.o. Jerilynn Mages who was seen today for physical therapy evaluation and treatment for  Pain in left shoulder, and Other chronic pain. Pt presents with signifant weakness of the L shoulder with observable atrophy. Se MRI.      OBJECTIVE IMPAIRMENTS decreased ROM, decreased strength, impaired UE functional use, and pain.    ACTIVITY LIMITATIONS carrying, lifting, dressing, and reach over head   PARTICIPATION LIMITATIONS: cleaning, driving, and overhead activities   PERSONAL FACTORS Past/current experiences and Time since onset of injury/illness/exacerbation are also affecting patient's functional outcome.    REHAB POTENTIAL: Fair due to chronicity and degree of weakness    CLINICAL DECISION MAKING: Evolving/moderate complexity   EVALUATION COMPLEXITY: Moderate     GOALS:     SHORT TERM GOALS= LTGs   LONG TERM GOALS: Target date: 08/26/22    Pt will demonstrate 3/5 strength of the L shoulder and biceps for L arm above shoulder reaching without weight and to assist with 2 handed above shoulder lifting with the R arm with a box up to 20# Baseline: Not able to raise the R arm Goal status: INITIAL   2.  Pt will demonstrate 100d of AROM for above shoulder function of the L UE Baseline: 60d Goal status: INITIAL   3.  Pt will report a decrease in L shoulder pain to 1/10 intermittently for improved function and QOL Baseline: 2/10 Goal status: INITIAL   4.  Pt will be Ind in a final HEP to maintain achieved LOF Baseline: To be initiated Goal status: INITIAL     PLAN: PT FREQUENCY: 2x/week   PT DURATION: 6 weeks   PLANNED INTERVENTIONS: Therapeutic exercises, Therapeutic activity, Patient/Family education, Self Care, Joint mobilization, Aquatic Therapy, Dry Needling, Cryotherapy, Moist heat, Taping, Ionotophoresis '4mg'$ /ml Dexamethasone, Manual therapy, and Re-evaluation   PLAN FOR NEXT SESSION: Complete FOTO for the L shoulder, review HEP and progress PRN       Hessie Diener,  PTA 07/18/22 12:17 PM Phone: [phone] Fax: [phone]

## 2022-07-20 ENCOUNTER — Other Ambulatory Visit: Payer: Self-pay | Admitting: Family Medicine

## 2022-07-20 ENCOUNTER — Ambulatory Visit: Payer: Medicare HMO | Admitting: Internal Medicine

## 2022-07-20 DIAGNOSIS — J439 Emphysema, unspecified: Secondary | ICD-10-CM

## 2022-07-20 NOTE — Therapy (Signed)
OUTPATIENT PHYSICAL THERAPY TREATMENT NOTE   Patient Name: Ronald Lowe MRN: [id number] DOB:04/28/1956, 66 y.o., male Today's Date: 07/21/2022  PCP: Dorna Mai, MD   REFERRING PROVIDER: Meade Maw, MD  END OF SESSION:   PT End of Session - 07/21/22 1118     Visit Number 3    Number of Visits 13    Date for PT Re-Evaluation 08/26/22    Authorization Type [employer] MEDICARE HMO; MEDICAID OF [place]    Authorization Time Period 07/07/22-08/20/22; 12 visits    Authorization - Visit Number 2    Authorization - Number of Visits 12    PT Start Time 1105    PT Stop Time 1150    PT Time Calculation (min) 45 min    Activity Tolerance Patient tolerated treatment well    Behavior During Therapy WFL for tasks assessed/performed              Past Medical History:  Diagnosis Date   Atrial fibrillation ([place])    Chronic kidney disease    Hyperlipidemia    Hypertension    Tobacco abuse    Past Surgical History:  Procedure Laterality Date   TONSILLECTOMY     Patient Active Problem List   Diagnosis Date Noted   Foraminal stenosis of cervical region 03/23/2022   Left arm weakness 03/23/2022   Paroxysmal atrial fibrillation ([place]) 12/22/2021   Secondary hypercoagulable state ([place]) 12/22/2021   Medication management 10/20/2021   Chronic obstructive pulmonary disease ([place]) 10/19/2021   Alcohol withdrawal delirium ([place]) 06/17/2021   Atrial fibrillation with rapid ventricular response ([place]) 06/17/2021   Macrocytic anemia 06/17/2021   COPD exacerbation ([place]) 06/14/2021   Multifocal pneumonia    CAP (community acquired pneumonia) 06/13/2021   Emphysema lung ([place]) 03/17/2021   Positive hepatitis C antibody test 10/02/2019   Tobacco dependence 09/24/2019   Alcohol use disorder, mild, abuse 09/24/2019   Orthostatic hypotension 06/12/2014   EKG abnormality 06/12/2014   Elevated blood pressure reading in office with diagnosis of hypertension 06/12/2014    REFERRING DIAG:   M25.512 (ICD-10-CM) - Pain in left shoulder  G89.29 (ICD-10-CM) - Other chronic pain    THERAPY DIAG:  Muscle weakness (generalized)  Chronic left shoulder pain  Stiffness of left shoulder, not elsewhere classified  Rationale for Evaluation and Treatment Rehabilitation  PERTINENT HISTORY: Not relevant   PRECAUTIONS: None  SUBJECTIVE: I did not get any exercises.   PAIN:  Are you having pain? Yes: NPRS scale: 2/10 Pain location: L shoulder Pain description: aches Aggravating factors: Certain arm movements Relieving factors: 3 Advils or tylenol   OBJECTIVE: (objective measures completed at initial evaluation unless otherwise dated)   DIAGNOSTIC FINDINGS:  L Shoulder MRI 05/18/22 IMPRESSION: 1. Moderate to high-grade partial-thickness articular sided tearing of the anterior greater than mid aspect of the supraspinatus tendon footprint in a region measuring up to 2.6 cm in AP dimension. 2. Mild-to-moderate supraspinatus and anterior infraspinatus muscle atrophy. 3. Moderate degenerative changes of the acromioclavicular joint. 4. Mild-to-moderate glenohumeral osteoarthritis.    Cervical MRI 03/18/22 IMPRESSION:  1. Severe foraminal stenosis bilaterally at C3-C4, C5-C6 and C6-C7  and on the left at C4-C5. Milder multilevel foraminal stenosis is  detailed above.  2. Moderate canal stenosis at C5-C6 and C6-C7 and mild-to-moderate  canal stenosis at C3-C4.      PATIENT SURVEYS:  FOTO L shoulder TBA   COGNITION:           Overall cognitive status: Within functional limits for tasks assessed  SENSATION: WFL   POSTURE: Forward head, rounded shoulders   UPPER EXTREMITY ROM:    AROM/AAROM ROM Right eval Left eval  Shoulder flexion 135 AROM 60 AROM/ 120 PROM  Shoulder extension      Shoulder abduction      Shoulder adduction      Shoulder internal rotation      Shoulder external rotation      Elbow flexion      Elbow  extension      Wrist flexion      Wrist extension      Wrist ulnar deviation      Wrist radial deviation      Wrist pronation      Wrist supination      (Blank rows = not tested)   UPPER EXTREMITY MMT:   MMT Right eval Left eval  Shoulder flexion 5 2/5  Shoulder extension 5 3/5  Shoulder abduction 5 2/5  Shoulder adduction 5 4+/5  Shoulder internal rotation 5 4+/5  Shoulder external rotation 5 2/5  Middle trapezius      Lower trapezius      Elbow flexion      Elbow extension      Wrist flexion      Wrist extension      Wrist ulnar deviation      Wrist radial deviation      Wrist pronation      Wrist supination      Grip strength (lbs)      (Blank rows = not tested)   SHOULDER SPECIAL TESTS:            Impingement tests: Hawkins/Kennedy impingement test: negative            Rotator cuff assessment: Drop arm test: positive , Empty can test: positive , and Full can test: positive             All tests were positive for weakness.            Biceps assessment: Speed's Test: Positive for weakness    PALPATION/Observation:  Significant atrophy of the l GH musclutature             TODAY'S TREATMENT:  [facility] Adult PT Treatment:                                                DATE: 07/21/22 Therapeutic Exercise: Shoulder pulley  2 mins UBE forward/backward 2 mins each direction Seated wrist sup/pro  Seated elbow flex c wrist neural 2x10 ( not able to complete in supination) SL L ER AROM/AAROM 2x10 Shoulder flex c wall assit 2x10  [facility] Adult PT Treatment:                                                DATE: 07/18/22 Therapeutic Exercise: Shoulder pulleys Standing shoulder wall slide x 10- increased pain after 6-8 reps Seated shoulder flexion table slides x 10 Seated shoulder abduction table slides x 10  Seated shoulder ER/IR table slides x 10 Seated Assisted bicep curls 10 x 2  Seated elbow supination  10 x 2     [facility] Adult PT Treatment:  DATE: 07/07/22 No treatment was provided   PATIENT EDUCATION: Education details: Eval findings, POC Person educated: Patient Education method: Explanation, Demonstration, Tactile cues, Verbal cues, and Handouts Education comprehension: verbalized understanding, returned demonstration, verbal cues required, and tactile cues required     HOME EXERCISE PROGRAM: Access Code: [email or web address] URL: [email or web address] Date: 07/21/2022 Prepared by: Gar Ponto  Exercises - Seated Shoulder Flexion Towel Slide at Table Top  - 1 x daily - 7 x weekly - 2 sets - 10 reps - Seated Shoulder Abduction Towel Slide at Table Top  - 1 x daily - 7 x weekly - 2 sets - 10 reps - Seated Elbow Flexion Shoulder Internal Rotation AAROM at Table with Towel  - 1 x daily - 7 x weekly - 2 sets - 10 reps - Seated Elbow Flexion AAROM  - 1 x daily - 7 x weekly - 2 sets - 10 reps - Sidelying Shoulder External Rotation  - 1 x daily - 7 x weekly - 2 sets - 10 reps - 3 hold - Shoulder Flexion Wall Slide with Towel  - 1 x daily - 7 x weekly - 2 sets - 10 reps - 3 hold   ASSESSMENT:   CLINICAL IMPRESSION: PT was completed for L shoulder and elbow strengthening. Wrist sup/pro and elbow flexion were completed with greater ease since the last visit. Pt tolerated the session without adverse effects. Pt will benefit from continued skilled PT to address impairments for improved L shoulder/UE function.   OBJECTIVE IMPAIRMENTS decreased ROM, decreased strength, impaired UE functional use, and pain.    ACTIVITY LIMITATIONS carrying, lifting, dressing, and reach over head   PARTICIPATION LIMITATIONS: cleaning, driving, and overhead activities   PERSONAL FACTORS Past/current experiences and Time since onset of injury/illness/exacerbation are also affecting patient's functional outcome.    REHAB POTENTIAL: Fair due to chronicity and degree of weakness    CLINICAL DECISION MAKING: Evolving/moderate  complexity   EVALUATION COMPLEXITY: Moderate     GOALS:     SHORT TERM GOALS= LTGs   LONG TERM GOALS: Target date: 08/26/22    Pt will demonstrate 3/5 strength of the L shoulder and biceps for L arm above shoulder reaching without weight and to assist with 2 handed above shoulder lifting with the R arm with a box up to 20# Baseline: Not able to raise the R arm Goal status: INITIAL   2.  Pt will demonstrate 100d of AROM for above shoulder function of the L UE Baseline: 60d Goal status: INITIAL   3.  Pt will report a decrease in L shoulder pain to 1/10 intermittently for improved function and QOL Baseline: 2/10 Goal status: INITIAL   4.  Pt will be Ind in a final HEP to maintain achieved LOF Baseline: To be initiated Goal status: INITIAL     PLAN: PT FREQUENCY: 2x/week   PT DURATION: 6 weeks   PLANNED INTERVENTIONS: Therapeutic exercises, Therapeutic activity, Patient/Family education, Self Care, Joint mobilization, Aquatic Therapy, Dry Needling, Cryotherapy, Moist heat, Taping, Ionotophoresis '4mg'$ /ml Dexamethasone, Manual therapy, and Re-evaluation   PLAN FOR NEXT SESSION: Complete FOTO for the L shoulder, review HEP and progress PRN     Gar Ponto MS, PT 07/21/22 1:54 PM

## 2022-07-21 ENCOUNTER — Ambulatory Visit: Payer: Medicare HMO | Attending: Neurosurgery

## 2022-07-21 DIAGNOSIS — G8929 Other chronic pain: Secondary | ICD-10-CM | POA: Insufficient documentation

## 2022-07-21 DIAGNOSIS — M6281 Muscle weakness (generalized): Secondary | ICD-10-CM | POA: Diagnosis not present

## 2022-07-21 DIAGNOSIS — M25512 Pain in left shoulder: Secondary | ICD-10-CM | POA: Diagnosis not present

## 2022-07-21 DIAGNOSIS — M25612 Stiffness of left shoulder, not elsewhere classified: Secondary | ICD-10-CM | POA: Insufficient documentation

## 2022-07-26 ENCOUNTER — Ambulatory Visit: Payer: Medicare HMO

## 2022-07-26 DIAGNOSIS — G8929 Other chronic pain: Secondary | ICD-10-CM

## 2022-07-26 DIAGNOSIS — M6281 Muscle weakness (generalized): Secondary | ICD-10-CM | POA: Diagnosis not present

## 2022-07-26 DIAGNOSIS — M25512 Pain in left shoulder: Secondary | ICD-10-CM | POA: Diagnosis not present

## 2022-07-26 DIAGNOSIS — M25612 Stiffness of left shoulder, not elsewhere classified: Secondary | ICD-10-CM

## 2022-07-26 NOTE — Therapy (Signed)
OUTPATIENT PHYSICAL THERAPY TREATMENT NOTE   Patient Name: Ronald Lowe MRN: [id number] DOB:11-Nov-1956, 66 y.o., male Today's Date: 07/26/2022  PCP: Dorna Mai, MD   REFERRING PROVIDER: Meade Maw, MD  END OF SESSION:   PT End of Session - 07/26/22 1102     Visit Number 4    Number of Visits 13    Date for PT Re-Evaluation 08/26/22    Authorization Type [employer] MEDICARE HMO; MEDICAID OF [place]    Authorization Time Period 07/07/22-08/20/22; 12 visits    Authorization - Visit Number 3    Authorization - Number of Visits 12    Progress Note Due on Visit 10    PT Start Time 1101    PT Stop Time 1143    PT Time Calculation (min) 42 min    Activity Tolerance Patient tolerated treatment well    Behavior During Therapy WFL for tasks assessed/performed               Past Medical History:  Diagnosis Date   Atrial fibrillation ([place])    Chronic kidney disease    Hyperlipidemia    Hypertension    Tobacco abuse    Past Surgical History:  Procedure Laterality Date   TONSILLECTOMY     Patient Active Problem List   Diagnosis Date Noted   Foraminal stenosis of cervical region 03/23/2022   Left arm weakness 03/23/2022   Paroxysmal atrial fibrillation ([place]) 12/22/2021   Secondary hypercoagulable state ([place]) 12/22/2021   Medication management 10/20/2021   Chronic obstructive pulmonary disease ([place]) 10/19/2021   Alcohol withdrawal delirium ([place]) 06/17/2021   Atrial fibrillation with rapid ventricular response ([place]) 06/17/2021   Macrocytic anemia 06/17/2021   COPD exacerbation ([place]) 06/14/2021   Multifocal pneumonia    CAP (community acquired pneumonia) 06/13/2021   Emphysema lung ([place]) 03/17/2021   Positive hepatitis C antibody test 10/02/2019   Tobacco dependence 09/24/2019   Alcohol use disorder, mild, abuse 09/24/2019   Orthostatic hypotension 06/12/2014   EKG abnormality 06/12/2014   Elevated blood pressure reading in office with diagnosis of hypertension  06/12/2014    REFERRING DIAG:  M25.512 (ICD-10-CM) - Pain in left shoulder  G89.29 (ICD-10-CM) - Other chronic pain    THERAPY DIAG:  Muscle weakness (generalized)  Chronic left shoulder pain  Stiffness of left shoulder, not elsewhere classified  Rationale for Evaluation and Treatment Rehabilitation  PERTINENT HISTORY: Not relevant   PRECAUTIONS: None  SUBJECTIVE: Pt reports some muscle soreness with the HEP.  PAIN:  Are you having pain? Yes: NPRS scale: 2/10 Pain location: L shoulder Pain description: aches Aggravating factors: Certain arm movements Relieving factors: 3 Advils or tylenol   OBJECTIVE: (objective measures completed at initial evaluation unless otherwise dated)   DIAGNOSTIC FINDINGS:  L Shoulder MRI 05/18/22 IMPRESSION: 1. Moderate to high-grade partial-thickness articular sided tearing of the anterior greater than mid aspect of the supraspinatus tendon footprint in a region measuring up to 2.6 cm in AP dimension. 2. Mild-to-moderate supraspinatus and anterior infraspinatus muscle atrophy. 3. Moderate degenerative changes of the acromioclavicular joint. 4. Mild-to-moderate glenohumeral osteoarthritis.    Cervical MRI 03/18/22 IMPRESSION:  1. Severe foraminal stenosis bilaterally at C3-C4, C5-C6 and C6-C7  and on the left at C4-C5. Milder multilevel foraminal stenosis is  detailed above.  2. Moderate canal stenosis at C5-C6 and C6-C7 and mild-to-moderate  canal stenosis at C3-C4.      PATIENT SURVEYS:  FOTO L shoulder TBA. 07/26/22= 43%   COGNITION:  Overall cognitive status: Within functional limits for tasks assessed                                  SENSATION: WFL   POSTURE: Forward head, rounded shoulders   UPPER EXTREMITY ROM:    AROM/AAROM ROM Right eval Left eval  Shoulder flexion 135 AROM 60 AROM/ 120 PROM  Shoulder extension      Shoulder abduction      Shoulder adduction      Shoulder internal rotation       Shoulder external rotation      Elbow flexion      Elbow extension      Wrist flexion      Wrist extension      Wrist ulnar deviation      Wrist radial deviation      Wrist pronation      Wrist supination      (Blank rows = not tested)   UPPER EXTREMITY MMT:   MMT Right eval Left eval  Shoulder flexion 5 2/5  Shoulder extension 5 3/5  Shoulder abduction 5 2/5  Shoulder adduction 5 4+/5  Shoulder internal rotation 5 4+/5  Shoulder external rotation 5 2/5  Middle trapezius      Lower trapezius      Elbow flexion      Elbow extension      Wrist flexion      Wrist extension      Wrist ulnar deviation      Wrist radial deviation      Wrist pronation      Wrist supination      Grip strength (lbs)      (Blank rows = not tested)   SHOULDER SPECIAL TESTS:            Impingement tests: Hawkins/Kennedy impingement test: negative            Rotator cuff assessment: Drop arm test: positive , Empty can test: positive , and Full can test: positive             All tests were positive for weakness.            Biceps assessment: Speed's Test: Positive for weakness    PALPATION/Observation:  Significant atrophy of the l GH musclutature             TODAY'S TREATMENT:  [facility] Adult PT Treatment:                                                DATE: 07/26/22 Therapeutic Exercise: UBE forward/backward 2 mins each direction Seated wrist sup/pro 2# 2x15 Seated elbow flex c wrist pronated2x10 ( not able to complete in supination) SL L ER AROM/AAROM 2x10 L Shoulder flexion supine to 90d 2x10 L shoulder flex 90d, circle each direction x10 each x 2 2# Shoulder flex c wall assit 2x10 shrugging Shoulder row 2x10 RTB Shoulder ext 2x10 RTB HEP updated Self Care: FOTO 43%  [facility] Adult PT Treatment:                                                DATE: 07/21/22 Therapeutic Exercise:  Shoulder pulley  2 mins UBE forward/backward 2 mins each direction Seated wrist sup/pro  Seated elbow flex c  wrist neural 2x10 ( not able to complete in supination) SL L ER AROM/AAROM 2x10 Shoulder flex c wall assit 2x10  [facility] Adult PT Treatment:                                                DATE: 07/18/22 Therapeutic Exercise: Shoulder pulleys Standing shoulder wall slide x 10- increased pain after 6-8 reps Seated shoulder flexion table slides x 10 Seated shoulder abduction table slides x 10  Seated shoulder ER/IR table slides x 10 Seated Assisted bicep curls 10 x 2  Seated elbow supination  10 x 2   PATIENT EDUCATION: Education details: Eval findings, POC Person educated: Patient Education method: Explanation, Demonstration, Tactile cues, Verbal cues, and Handouts Education comprehension: verbalized understanding, returned demonstration, verbal cues required, and tactile cues required    HOME EXERCISE PROGRAM: Access Code: [email or web address]   ASSESSMENT:   CLINICAL IMPRESSION: Pt participated in PT for L shoulder rotator and peri-scapular strengthing. Pt has significant strength deficits, but emonstrating minimal gains. HEP updated. FOTO for shoulder completed. Pt tolerated the session without adverse effects. Pt will benefit from continued skilled PT to address impairments for improved L shoulder/UE function.   OBJECTIVE IMPAIRMENTS decreased ROM, decreased strength, impaired UE functional use, and pain.    ACTIVITY LIMITATIONS carrying, lifting, dressing, and reach over head   PARTICIPATION LIMITATIONS: cleaning, driving, and overhead activities     GOALS:     SHORT TERM GOALS= LTGs   LONG TERM GOALS: Target date: 08/26/22    Pt will demonstrate 3/5 strength of the L shoulder and biceps for L arm above shoulder reaching without weight and to assist with 2 handed above shoulder lifting with the R arm with a box up to 20# Baseline: Not able to raise the R arm Goal status: INITIAL   2.  Pt will demonstrate 100d of AROM for above shoulder function of the L UE Baseline: 60d Goal status:  INITIAL   3.  Pt will report a decrease in L shoulder pain to 1/10 intermittently for improved function and QOL Baseline: 2/10 Goal status: INITIAL   4.  Pt will be Ind in a final HEP to maintain achieved LOF Baseline: To be initiated Goal status: INITIAL     PLAN: PT FREQUENCY: 2x/week   PT DURATION: 6 weeks   PLANNED INTERVENTIONS: Therapeutic exercises, Therapeutic activity, Patient/Family education, Self Care, Joint mobilization, Aquatic Therapy, Dry Needling, Cryotherapy, Moist heat, Taping, Ionotophoresis '4mg'$ /ml Dexamethasone, Manual therapy, and Re-evaluation   PLAN FOR NEXT SESSION: Complete FOTO for the L shoulder, review HEP and progress PRN     Gar Ponto MS, PT 07/26/22 12:07 PM

## 2022-07-27 NOTE — Therapy (Signed)
OUTPATIENT PHYSICAL THERAPY TREATMENT NOTE   Patient Name: Ronald Lowe MRN: [id number] DOB:03-13-1956, 66 y.o., male Today's Date: 07/27/2022  PCP: Dorna Mai, MD   REFERRING PROVIDER: Meade Maw, MD  END OF SESSION:       Past Medical History:  Diagnosis Date   Atrial fibrillation [facility])    Chronic kidney disease    Hyperlipidemia    Hypertension    Tobacco abuse    Past Surgical History:  Procedure Laterality Date   TONSILLECTOMY     Patient Active Problem List   Diagnosis Date Noted   Foraminal stenosis of cervical region 03/23/2022   Left arm weakness 03/23/2022   Paroxysmal atrial fibrillation ([place]) 12/22/2021   Secondary hypercoagulable state ([place]) 12/22/2021   Medication management 10/20/2021   Chronic obstructive pulmonary disease ([place]) 10/19/2021   Alcohol withdrawal delirium ([place]) 06/17/2021   Atrial fibrillation with rapid ventricular response ([place]) 06/17/2021   Macrocytic anemia 06/17/2021   COPD exacerbation ([place]) 06/14/2021   Multifocal pneumonia    CAP (community acquired pneumonia) 06/13/2021   Emphysema lung ([place]) 03/17/2021   Positive hepatitis C antibody test 10/02/2019   Tobacco dependence 09/24/2019   Alcohol use disorder, mild, abuse 09/24/2019   Orthostatic hypotension 06/12/2014   EKG abnormality 06/12/2014   Elevated blood pressure reading in office with diagnosis of hypertension 06/12/2014    REFERRING DIAG:  M25.512 (ICD-10-CM) - Pain in left shoulder  G89.29 (ICD-10-CM) - Other chronic pain    THERAPY DIAG:  No diagnosis found.  Rationale for Evaluation and Treatment Rehabilitation  PERTINENT HISTORY: Not relevant   PRECAUTIONS: None  SUBJECTIVE: Pt reports some muscle soreness with the HEP.  PAIN:  Are you having pain? Yes: NPRS scale: 2/10 Pain location: L shoulder Pain description: aches Aggravating factors: Certain arm movements Relieving factors: 3 Advils or tylenol   OBJECTIVE: (objective  measures completed at initial evaluation unless otherwise dated)   DIAGNOSTIC FINDINGS:  L Shoulder MRI 05/18/22 IMPRESSION: 1. Moderate to high-grade partial-thickness articular sided tearing of the anterior greater than mid aspect of the supraspinatus tendon footprint in a region measuring up to 2.6 cm in AP dimension. 2. Mild-to-moderate supraspinatus and anterior infraspinatus muscle atrophy. 3. Moderate degenerative changes of the acromioclavicular joint. 4. Mild-to-moderate glenohumeral osteoarthritis.    Cervical MRI 03/18/22 IMPRESSION:  1. Severe foraminal stenosis bilaterally at C3-C4, C5-C6 and C6-C7  and on the left at C4-C5. Milder multilevel foraminal stenosis is  detailed above.  2. Moderate canal stenosis at C5-C6 and C6-C7 and mild-to-moderate  canal stenosis at C3-C4.      PATIENT SURVEYS:  FOTO L shoulder TBA. 07/26/22= 43%   COGNITION:           Overall cognitive status: Within functional limits for tasks assessed                                  SENSATION: WFL   POSTURE: Forward head, rounded shoulders   UPPER EXTREMITY ROM:    AROM/AAROM ROM Right eval Left eval  Shoulder flexion 135 AROM 60 AROM/ 120 PROM  Shoulder extension      Shoulder abduction      Shoulder adduction      Shoulder internal rotation      Shoulder external rotation      Elbow flexion      Elbow extension      Wrist flexion      Wrist extension  Wrist ulnar deviation      Wrist radial deviation      Wrist pronation      Wrist supination      (Blank rows = not tested)   UPPER EXTREMITY MMT:   MMT Right eval Left eval  Shoulder flexion 5 2/5  Shoulder extension 5 3/5  Shoulder abduction 5 2/5  Shoulder adduction 5 4+/5  Shoulder internal rotation 5 4+/5  Shoulder external rotation 5 2/5  Middle trapezius      Lower trapezius      Elbow flexion      Elbow extension      Wrist flexion      Wrist extension      Wrist ulnar deviation      Wrist radial  deviation      Wrist pronation      Wrist supination      Grip strength (lbs)      (Blank rows = not tested)   SHOULDER SPECIAL TESTS:            Impingement tests: Hawkins/Kennedy impingement test: negative            Rotator cuff assessment: Drop arm test: positive , Empty can test: positive , and Full can test: positive             All tests were positive for weakness.            Biceps assessment: Speed's Test: Positive for weakness    PALPATION/Observation:  Significant atrophy of the l GH musclutature             TODAY'S TREATMENT:  [facility] Adult PT Treatment:                                                DATE: 07/28/22 Therapeutic Exercise: *** Manual Therapy: *** Neuromuscular re-ed: *** Therapeutic Activity: *** Modalities: *** Self Care: ***  [facility] Adult PT Treatment:                                                DATE: 07/26/22 Therapeutic Exercise: UBE forward/backward 2 mins each direction Seated wrist sup/pro 2# 2x15 Seated elbow flex c wrist pronated2x10 ( not able to complete in supination) SL L ER AROM/AAROM 2x10 L Shoulder flexion supine to 90d 2x10 L shoulder flex 90d, circle each direction x10 each x 2 2# Shoulder flex c wall assit 2x10 shrugging Shoulder row 2x10 RTB Shoulder ext 2x10 RTB HEP updated Self Care: FOTO 43%  [facility] Adult PT Treatment:                                                DATE: 07/21/22 Therapeutic Exercise: Shoulder pulley  2 mins UBE forward/backward 2 mins each direction Seated wrist sup/pro  Seated elbow flex c wrist neural 2x10 ( not able to complete in supination) SL L ER AROM/AAROM 2x10 Shoulder flex c wall assit 2x10  PATIENT EDUCATION: Education details: Eval findings, POC Person educated: Patient Education method: Explanation, Demonstration, Tactile cues, Verbal cues, and Handouts Education comprehension: verbalized understanding, returned demonstration, verbal cues  required, and tactile cues required    HOME  EXERCISE PROGRAM: Access Code: [email or web address]   ASSESSMENT:   CLINICAL IMPRESSION: Pt participated in PT for L shoulder rotator and peri-scapular strengthing. Pt has significant strength deficits, but emonstrating minimal gains. HEP updated. FOTO for shoulder completed. Pt tolerated the session without adverse effects. Pt will benefit from continued skilled PT to address impairments for improved L shoulder/UE function.   OBJECTIVE IMPAIRMENTS decreased ROM, decreased strength, impaired UE functional use, and pain.    ACTIVITY LIMITATIONS carrying, lifting, dressing, and reach over head   PARTICIPATION LIMITATIONS: cleaning, driving, and overhead activities     GOALS:     SHORT TERM GOALS= LTGs   LONG TERM GOALS: Target date: 08/26/22    Pt will demonstrate 3/5 strength of the L shoulder and biceps for L arm above shoulder reaching without weight and to assist with 2 handed above shoulder lifting with the R arm with a box up to 20# Baseline: Not able to raise the R arm Goal status: INITIAL   2.  Pt will demonstrate 100d of AROM for above shoulder function of the L UE Baseline: 60d Goal status: INITIAL   3.  Pt will report a decrease in L shoulder pain to 1/10 intermittently for improved function and QOL Baseline: 2/10 Goal status: INITIAL   4.  Pt will be Ind in a final HEP to maintain achieved LOF Baseline: To be initiated Goal status: INITIAL     PLAN: PT FREQUENCY: 2x/week   PT DURATION: 6 weeks   PLANNED INTERVENTIONS: Therapeutic exercises, Therapeutic activity, Patient/Family education, Self Care, Joint mobilization, Aquatic Therapy, Dry Needling, Cryotherapy, Moist heat, Taping, Ionotophoresis '4mg'$ /ml Dexamethasone, Manual therapy, and Re-evaluation   PLAN FOR NEXT SESSION: Complete FOTO for the L shoulder, review HEP and progress PRN     Gar Ponto MS, PT 07/27/22 9:22 PM

## 2022-07-28 ENCOUNTER — Ambulatory Visit: Payer: Medicare HMO

## 2022-07-28 DIAGNOSIS — M25612 Stiffness of left shoulder, not elsewhere classified: Secondary | ICD-10-CM

## 2022-07-28 DIAGNOSIS — G8929 Other chronic pain: Secondary | ICD-10-CM

## 2022-07-28 DIAGNOSIS — M25512 Pain in left shoulder: Secondary | ICD-10-CM | POA: Diagnosis not present

## 2022-07-28 DIAGNOSIS — M6281 Muscle weakness (generalized): Secondary | ICD-10-CM | POA: Diagnosis not present

## 2022-08-01 NOTE — Therapy (Deleted)
OUTPATIENT PHYSICAL THERAPY TREATMENT NOTE   Patient Name: Ronald Lowe MRN: [id number] DOB:Feb 22, 1956, 66 y.o., male Today's Date: 08/01/2022  PCP: Dorna Mai, MD   REFERRING PROVIDER: Meade Maw, MD  END OF SESSION:        Past Medical History:  Diagnosis Date   Atrial fibrillation [facility])    Chronic kidney disease    Hyperlipidemia    Hypertension    Tobacco abuse    Past Surgical History:  Procedure Laterality Date   TONSILLECTOMY     Patient Active Problem List   Diagnosis Date Noted   Foraminal stenosis of cervical region 03/23/2022   Left arm weakness 03/23/2022   Paroxysmal atrial fibrillation ([place]) 12/22/2021   Secondary hypercoagulable state ([place]) 12/22/2021   Medication management 10/20/2021   Chronic obstructive pulmonary disease ([place]) 10/19/2021   Alcohol withdrawal delirium ([place]) 06/17/2021   Atrial fibrillation with rapid ventricular response ([place]) 06/17/2021   Macrocytic anemia 06/17/2021   COPD exacerbation ([place]) 06/14/2021   Multifocal pneumonia    CAP (community acquired pneumonia) 06/13/2021   Emphysema lung ([place]) 03/17/2021   Positive hepatitis C antibody test 10/02/2019   Tobacco dependence 09/24/2019   Alcohol use disorder, mild, abuse 09/24/2019   Orthostatic hypotension 06/12/2014   EKG abnormality 06/12/2014   Elevated blood pressure reading in office with diagnosis of hypertension 06/12/2014    REFERRING DIAG:  M25.512 (ICD-10-CM) - Pain in left shoulder  G89.29 (ICD-10-CM) - Other chronic pain    THERAPY DIAG:  No diagnosis found.  Rationale for Evaluation and Treatment Rehabilitation  PERTINENT HISTORY: Not relevant   PRECAUTIONS: None  SUBJECTIVE: Pt reports some improved use of his L UE below the level of his chest.  PAIN:  Are you having pain? Yes: NPRS scale: 0/10, 8/10 initially with walls slides, but resolves c repetitions Pain location: L shoulder Pain description: aches Aggravating factors:  Certain arm movements Relieving factors: 3 Advils or tylenol   OBJECTIVE: (objective measures completed at initial evaluation unless otherwise dated)   DIAGNOSTIC FINDINGS:  L Shoulder MRI 05/18/22 IMPRESSION: 1. Moderate to high-grade partial-thickness articular sided tearing of the anterior greater than mid aspect of the supraspinatus tendon footprint in a region measuring up to 2.6 cm in AP dimension. 2. Mild-to-moderate supraspinatus and anterior infraspinatus muscle atrophy. 3. Moderate degenerative changes of the acromioclavicular joint. 4. Mild-to-moderate glenohumeral osteoarthritis.    Cervical MRI 03/18/22 IMPRESSION:  1. Severe foraminal stenosis bilaterally at C3-C4, C5-C6 and C6-C7  and on the left at C4-C5. Milder multilevel foraminal stenosis is  detailed above.  2. Moderate canal stenosis at C5-C6 and C6-C7 and mild-to-moderate  canal stenosis at C3-C4.      PATIENT SURVEYS:  FOTO L shoulder TBA. 07/26/22= 43%   COGNITION:           Overall cognitive status: Within functional limits for tasks assessed                                  SENSATION: WFL   POSTURE: Forward head, rounded shoulders   UPPER EXTREMITY ROM:    AROM/AAROM ROM Right eval Left eval  Shoulder flexion 135 AROM 60 AROM/ 120 PROM  Shoulder extension      Shoulder abduction      Shoulder adduction      Shoulder internal rotation      Shoulder external rotation      Elbow flexion  Elbow extension      Wrist flexion      Wrist extension      Wrist ulnar deviation      Wrist radial deviation      Wrist pronation      Wrist supination      (Blank rows = not tested)   UPPER EXTREMITY MMT:   MMT Right eval Left eval  Shoulder flexion 5 2/5  Shoulder extension 5 3/5  Shoulder abduction 5 2/5  Shoulder adduction 5 4+/5  Shoulder internal rotation 5 4+/5  Shoulder external rotation 5 2/5  Middle trapezius      Lower trapezius      Elbow flexion      Elbow extension       Wrist flexion      Wrist extension      Wrist ulnar deviation      Wrist radial deviation      Wrist pronation      Wrist supination      Grip strength (lbs)      (Blank rows = not tested)   SHOULDER SPECIAL TESTS:            Impingement tests: Hawkins/Kennedy impingement test: negative            Rotator cuff assessment: Drop arm test: positive , Empty can test: positive , and Full can test: positive             All tests were positive for weakness.            Biceps assessment: Speed's Test: Positive for weakness    PALPATION/Observation:  Significant atrophy of the l GH musclutature             TODAY'S TREATMENT:  [facility] Adult PT Treatment:                                                DATE: 07/28/22 Therapeutic Exercise: UBE forward/backward 2 mins each direction Seated wrist sup/pro 3# 3x10 Seated elbow flex c wrist pronated 2x10 ( not able to complete in supination) SL L ER AROM/AAROM 3x10 L Shoulder flexion supine to 90d 2x10 L shoulder flex 90d, circle each direction x10 each x 2 2# Shoulder flex c Ranger 3x10 Shoulder row 2x15 GTB Shoulder ext 2x15 GTB HEP updated   [facility] Adult PT Treatment:                                                DATE: 07/26/22 Therapeutic Exercise: UBE forward/backward 2 mins each direction Seated wrist sup/pro 2# 2x15 Seated elbow flex c wrist pronated2x10 ( not able to complete in supination) SL L ER AROM/AAROM 2x10 L Shoulder flexion supine to 90d 2x10 L shoulder flex 90d, circle each direction x10 each x 2 2# Shoulder flex c wall assit 2x10 shrugging Shoulder row 2x10 RTB Shoulder ext 2x10 RTB HEP updated Self Care: FOTO 43%  [facility] Adult PT Treatment:                                                DATE: 07/21/22  Therapeutic Exercise: Shoulder pulley  2 mins UBE forward/backward 2 mins each direction Seated wrist sup/pro  Seated elbow flex c wrist neural 2x10 ( not able to complete in supination) SL L ER AROM/AAROM 2x10 Shoulder  flex c wall assit 2x10  PATIENT EDUCATION: Education details: Eval findings, POC Person educated: Patient Education method: Explanation, Demonstration, Tactile cues, Verbal cues, and Handouts Education comprehension: verbalized understanding, returned demonstration, verbal cues required, and tactile cues required    HOME EXERCISE PROGRAM: Access Code: [email or web address]   ASSESSMENT:   CLINICAL IMPRESSION: Pt strength and ability to complete his assigned therex is improving. UE use above chest level is significantly limited. Pt participates with good effort and tolerated PT today without adverse effects. Pt will benefit from continued skilled PT to address impairments for improved L shoulder/UE function.   OBJECTIVE IMPAIRMENTS decreased ROM, decreased strength, impaired UE functional use, and pain.    ACTIVITY LIMITATIONS carrying, lifting, dressing, and reach over head   PARTICIPATION LIMITATIONS: cleaning, driving, and overhead activities     GOALS:     SHORT TERM GOALS= LTGs   LONG TERM GOALS: Target date: 08/26/22    Pt will demonstrate 3/5 strength of the L shoulder and biceps for L arm above shoulder reaching without weight and to assist with 2 handed above shoulder lifting with the R arm with a box up to 20# Baseline: Not able to raise the R arm Goal status: INITIAL   2.  Pt will demonstrate 100d of AROM for above shoulder function of the L UE Baseline: 60d Goal status: INITIAL   3.  Pt will report a decrease in L shoulder pain to 1/10 intermittently for improved function and QOL Baseline: 2/10 Goal status: INITIAL   4.  Pt will be Ind in a final HEP to maintain achieved LOF Baseline: started Goal status: Initial HEP is being completed     PLAN: PT FREQUENCY: 2x/week   PT DURATION: 6 weeks   PLANNED INTERVENTIONS: Therapeutic exercises, Therapeutic activity, Patient/Family education, Self Care, Joint mobilization, Aquatic Therapy, Dry Needling, Cryotherapy, Moist heat,  Taping, Ionotophoresis '4mg'$ /ml Dexamethasone, Manual therapy, and Re-evaluation   PLAN FOR NEXT SESSION: Complete FOTO for the L shoulder, review HEP and progress PRN     Gar Ponto MS, PT 08/01/22 4:12 PM

## 2022-08-02 ENCOUNTER — Ambulatory Visit: Payer: Medicare HMO | Admitting: Physical Therapy

## 2022-08-03 NOTE — Therapy (Signed)
OUTPATIENT PHYSICAL THERAPY TREATMENT NOTE   Patient Name: Ronald Lowe MRN: [id number] DOB:1956/12/15, 66 y.o., male Today's Date: 08/04/2022  PCP: Dorna Mai, MD   REFERRING PROVIDER: Meade Maw, MD  END OF SESSION:   PT End of Session - 08/04/22 1023     Visit Number 6    Number of Visits 13    Date for PT Re-Evaluation 08/26/22    Authorization Type [employer]IC[employer] HMO; MEDICAID [place]     Authorization Time Period 07/07/22-08/20/22; 12 visits    Authorization - Visit Number 5    Authorization - Number of Visits 12    Progress Note Due on Visit 10    PT Start Time 1016    PT Stop Time 1100    PT Time Calculation (min) 44 min    Activity Tolerance Patient tolerated treatment well    Behavior During Therapy WFL for tasks assessed/performed                 Past Medical History:  Diagnosis Date   Atrial fibrillation ([place])    Chronic kidney disease    Hyperlipidemia    Hypertension    Tobacco abuse    Past Surgical History:  Procedure Laterality Date   TONSILLECTOMY     Patient Active Problem List   Diagnosis Date Noted   Foraminal stenosis of cervical region 03/23/2022   Left arm weakness 03/23/2022   Paroxysmal atrial fibrillation ([place]) 12/22/2021   Secondary hypercoagulable state ([place]) 12/22/2021   Medication management 10/20/2021   Chronic obstructive pulmonary disease ([place]) 10/19/2021   Alcohol withdrawal delirium ([place]) 06/17/2021   Atrial fibrillation with rapid ventricular response ([place]) 06/17/2021   Macrocytic anemia 06/17/2021   COPD exacerbation ([place]) 06/14/2021   Multifocal pneumonia    CAP (community acquired pneumonia) 06/13/2021   Emphysema lung ([place]) 03/17/2021   Positive hepatitis C antibody test 10/02/2019   Tobacco dependence 09/24/2019   Alcohol use disorder, mild, abuse 09/24/2019   Orthostatic hypotension 06/12/2014   EKG abnormality 06/12/2014   Elevated blood pressure reading in office with diagnosis of hypertension  06/12/2014    REFERRING DIAG:  M25.512 (ICD-10-CM) - Pain in left shoulder  G89.29 (ICD-10-CM) - Other chronic pain    THERAPY DIAG:  Muscle weakness (generalized)  Chronic left shoulder pain  Stiffness of left shoulder, not elsewhere classified  Rationale for Evaluation and Treatment Rehabilitation  PERTINENT HISTORY: Not relevant   PRECAUTIONS: None  SUBJECTIVE: Reports use of L arm is gradually getting better.  PAIN:  Are you having pain? Yes: NPRS scale: 0/10 Pain location: L shoulder Pain description: aches Aggravating factors: Certain arm movements Relieving factors: 3 Advils or tylenol   OBJECTIVE: (objective measures completed at initial evaluation unless otherwise dated)   DIAGNOSTIC FINDINGS:  L Shoulder MRI 05/18/22 IMPRESSION: 1. Moderate to high-grade partial-thickness articular sided tearing of the anterior greater than mid aspect of the supraspinatus tendon footprint in a region measuring up to 2.6 cm in AP dimension. 2. Mild-to-moderate supraspinatus and anterior infraspinatus muscle atrophy. 3. Moderate degenerative changes of the acromioclavicular joint. 4. Mild-to-moderate glenohumeral osteoarthritis.    Cervical MRI 03/18/22 IMPRESSION:  1. Severe foraminal stenosis bilaterally at C3-C4, C5-C6 and C6-C7  and on the left at C4-C5. Milder multilevel foraminal stenosis is  detailed above.  2. Moderate canal stenosis at C5-C6 and C6-C7 and mild-to-moderate  canal stenosis at C3-C4.      PATIENT SURVEYS:  FOTO L shoulder TBA. 07/26/22= 43%   COGNITION:  Overall cognitive status: Within functional limits for tasks assessed                                  SENSATION: WFL   POSTURE: Forward head, rounded shoulders   UPPER EXTREMITY ROM:    AROM/AAROM ROM Right eval Left eval LT  Shoulder flexion 135 AROM 60 AROM/ 120 PROM AROM 70d  Shoulder extension       Shoulder abduction       Shoulder adduction       Shoulder internal  rotation       Shoulder external rotation       Elbow flexion       Elbow extension       Wrist flexion       Wrist extension       Wrist ulnar deviation       Wrist radial deviation       Wrist pronation       Wrist supination       (Blank rows = not tested)   UPPER EXTREMITY MMT:   MMT Right eval Left eval  Shoulder flexion 5 2/5  Shoulder extension 5 3/5  Shoulder abduction 5 2/5  Shoulder adduction 5 4+/5  Shoulder internal rotation 5 4+/5  Shoulder external rotation 5 2/5  Middle trapezius      Lower trapezius      Elbow flexion      Elbow extension      Wrist flexion      Wrist extension      Wrist ulnar deviation      Wrist radial deviation      Wrist pronation      Wrist supination      Grip strength (lbs)      (Blank rows = not tested)   SHOULDER SPECIAL TESTS:            Impingement tests: Hawkins/Kennedy impingement test: negative            Rotator cuff assessment: Drop arm test: positive , Empty can test: positive , and Full can test: positive             All tests were positive for weakness.            Biceps assessment: Speed's Test: Positive for weakness    PALPATION/Observation:  Significant atrophy of the l GH musclutature             TODAY'S TREATMENT:  [facility] Adult PT Treatment:                                                DATE: 08/04/22 Therapeutic Exercise: UBE forward/backward 2 mins each direction Seated wrist sup/pro 3# 3x10 Seated elbow flex c wrist pronated 2x10 2# (not able to complete in supination) SL L ER AROM/A,AROM 3x10 SL L abd to 90d 2x8, labored c sh shrug L Shoulder flexion supine to 90d  L shoulder flex 90d, circle each direction x10 each x 2 2# Shoulder row 2x15 RTB Shoulder ext 2x15 RTB  [facility] Adult PT Treatment:  DATE: 07/28/22 Therapeutic Exercise: UBE forward/backward 2 mins each direction Seated wrist sup/pro 3# 3x10 Seated elbow flex c wrist pronated 2x10 ( not able to  complete in supination) SL L ER AROM/AAROM 3x10 L Shoulder flexion supine to 90d 2x10 L shoulder flex 90d, circle each direction x10 each x 2 2# Shoulder flex c Ranger 3x10 Shoulder row 2x15 GTB Shoulder ext 2x15 GTB HEP updated  PATIENT EDUCATION: Education details: Eval findings, POC Person educated: Patient Education method: Explanation, Demonstration, Tactile cues, Verbal cues, and Handouts Education comprehension: verbalized understanding, returned demonstration, verbal cues required, and tactile cues required    HOME EXERCISE PROGRAM: Access Code: [email or web address]   ASSESSMENT:   CLINICAL IMPRESSION: PT was completed for rotator cuff and peri-scapular strengthening.  Weakness of the L shoulder is significant. Pt notes use of the L arn below chest is better. AROM for flexion has increasing minimally. Pt participates with good effort and tolerated PT today without adverse effects. Pt will benefit from continued skilled PT to address impairments for improved L shoulder/UE strength and function.   OBJECTIVE IMPAIRMENTS decreased ROM, decreased strength, impaired UE functional use, and pain.    ACTIVITY LIMITATIONS carrying, lifting, dressing, and reach over head   PARTICIPATION LIMITATIONS: cleaning, driving, and overhead activities     GOALS:     SHORT TERM GOALS= LTGs   LONG TERM GOALS: Target date: 08/26/22    Pt will demonstrate 3/5 strength of the L shoulder and biceps for L arm above shoulder reaching without weight and to assist with 2 handed above shoulder lifting with the R arm with a box up to 20# Baseline: Not able to raise the R arm Goal status: INITIAL   2.  Pt will demonstrate 100d of AROM for above shoulder function of the L UE Baseline: 60d Status: 08/04/22= 70d  Goal status: INITIAL   3.  Pt will report a decrease in L shoulder pain to 1/10 intermittently for improved function and QOL Baseline: 2/10 Goal status: INITIAL   4.  Pt will be Ind in a final HEP to  maintain achieved LOF Baseline: started Goal status: Initial HEP is being completed     PLAN: PT FREQUENCY: 2x/week   PT DURATION: 6 weeks   PLANNED INTERVENTIONS: Therapeutic exercises, Therapeutic activity, Patient/Family education, Self Care, Joint mobilization, Aquatic Therapy, Dry Needling, Cryotherapy, Moist heat, Taping, Ionotophoresis '4mg'$ /ml Dexamethasone, Manual therapy, and Re-evaluation   PLAN FOR NEXT SESSION: Complete FOTO for the L shoulder, review HEP and progress PRN. Re-assess [employer] MS, PT 08/04/22 1:32 PM

## 2022-08-04 ENCOUNTER — Ambulatory Visit: Payer: Medicare HMO

## 2022-08-04 DIAGNOSIS — M25512 Pain in left shoulder: Secondary | ICD-10-CM | POA: Diagnosis not present

## 2022-08-04 DIAGNOSIS — M25612 Stiffness of left shoulder, not elsewhere classified: Secondary | ICD-10-CM

## 2022-08-04 DIAGNOSIS — G8929 Other chronic pain: Secondary | ICD-10-CM

## 2022-08-04 DIAGNOSIS — M6281 Muscle weakness (generalized): Secondary | ICD-10-CM

## 2022-08-08 NOTE — Therapy (Unsigned)
OUTPATIENT PHYSICAL THERAPY TREATMENT NOTE   Patient Name: Ronald Lowe MRN: [id number] DOB:1956/03/27, 66 y.o., male Today's Date: 08/09/2022  PCP: Dorna Mai, MD   REFERRING PROVIDER: Meade Maw, MD  END OF SESSION:   PT End of Session - 08/09/22 1024     Visit Number 7    Number of Visits 13    Date for PT Re-Evaluation 08/26/22    Authorization Type HU[employer] MEDICARE HMO; MEDICAID OF [place]    Authorization Time Period 07/07/22-08/20/22; 12 visits    Authorization - Visit Number 6    Authorization - Number of Visits 12    PT Start Time 1022    PT Stop Time 1100    PT Time Calculation (min) 38 min    Activity Tolerance Patient tolerated treatment well    Behavior During Therapy WFL for tasks assessed/performed                  Past Medical History:  Diagnosis Date   Atrial fibrillation ([place])    Chronic kidney disease    Hyperlipidemia    Hypertension    Tobacco abuse    Past Surgical History:  Procedure Laterality Date   TONSILLECTOMY     Patient Active Problem List   Diagnosis Date Noted   Foraminal stenosis of cervical region 03/23/2022   Left arm weakness 03/23/2022   Paroxysmal atrial fibrillation ([place]) 12/22/2021   Secondary hypercoagulable state ([place]) 12/22/2021   Medication management 10/20/2021   Chronic obstructive pulmonary disease ([place]) 10/19/2021   Alcohol withdrawal delirium ([place]) 06/17/2021   Atrial fibrillation with rapid ventricular response ([place]) 06/17/2021   Macrocytic anemia 06/17/2021   COPD exacerbation ([place]) 06/14/2021   Multifocal pneumonia    CAP (community acquired pneumonia) 06/13/2021   Emphysema lung ([place]) 03/17/2021   Positive hepatitis C antibody test 10/02/2019   Tobacco dependence 09/24/2019   Alcohol use disorder, mild, abuse 09/24/2019   Orthostatic hypotension 06/12/2014   EKG abnormality 06/12/2014   Elevated blood pressure reading in office with diagnosis of hypertension 06/12/2014    REFERRING DIAG:   M25.512 (ICD-10-CM) - Pain in left shoulder  G89.29 (ICD-10-CM) - Other chronic pain    THERAPY DIAG:  Muscle weakness (generalized)  Chronic left shoulder pain  Stiffness of left shoulder, not elsewhere classified  Rationale for Evaluation and Treatment Rehabilitation  PERTINENT HISTORY: Not relevant   PRECAUTIONS: None  SUBJECTIVE: It is getting a little bit better.     PAIN:  Are you having pain? Yes: NPRS scale: 0/10 Pain location: L shoulder Pain description: aches Aggravating factors: Certain arm movements Relieving factors: 3 Advils or tylenol   OBJECTIVE: (objective measures completed at initial evaluation unless otherwise dated)   DIAGNOSTIC FINDINGS:  L Shoulder MRI 05/18/22 IMPRESSION: 1. Moderate to high-grade partial-thickness articular sided tearing of the anterior greater than mid aspect of the supraspinatus tendon footprint in a region measuring up to 2.6 cm in AP dimension. 2. Mild-to-moderate supraspinatus and anterior infraspinatus muscle atrophy. 3. Moderate degenerative changes of the acromioclavicular joint. 4. Mild-to-moderate glenohumeral osteoarthritis.    Cervical MRI 03/18/22 IMPRESSION:  1. Severe foraminal stenosis bilaterally at C3-C4, C5-C6 and C6-C7  and on the left at C4-C5. Milder multilevel foraminal stenosis is  detailed above.  2. Moderate canal stenosis at C5-C6 and C6-C7 and mild-to-moderate  canal stenosis at C3-C4.      PATIENT SURVEYS:  FOTO L shoulder TBA. 07/26/22= 43%   COGNITION:  Overall cognitive status: Within functional limits for tasks assessed                                  SENSATION: WFL   POSTURE: Forward head, rounded shoulders   UPPER EXTREMITY ROM:    AROM/AAROM ROM Right eval Left eval LT  Shoulder flexion 135 AROM 60 AROM/ 120 PROM AROM 70d  Shoulder extension       Shoulder abduction       Shoulder adduction       Shoulder internal rotation       Shoulder external rotation      52 deg  Elbow flexion       Elbow extension       Wrist flexion       Wrist extension       Wrist ulnar deviation       Wrist radial deviation       Wrist pronation       Wrist supination       (Blank rows = not tested)   UPPER EXTREMITY MMT:   MMT Right eval Left eval Lt 08/09/22  Shoulder flexion 5 2/5 2+/5 supine  Shoulder extension 5 3/5   Shoulder abduction 5 2/5 2/5  Shoulder adduction 5 4+/5   Shoulder internal rotation 5 4+/5 4/5  Shoulder external rotation 5 2/5 2+/5  Middle trapezius       Lower trapezius       Elbow flexion     3/5 with pronation , 1/5 with supination  Elbow extension     4+/5  Wrist flexion       Wrist extension       Wrist ulnar deviation       Wrist radial deviation       Wrist pronation       Wrist supination       Grip strength (lbs)       (Blank rows = not tested)   SHOULDER SPECIAL TESTS:            Impingement tests: Hawkins/Kennedy impingement test: negative            Rotator cuff assessment: Drop arm test: positive , Empty can test: positive , and Full can test: positive             All tests were positive for weakness.            Biceps assessment: Speed's Test: Positive for weakness    PALPATION/Observation:  Significant atrophy of the l GH musclutature             TODAY'S TREATMENT:   [facility] Adult PT Treatment:                                                DATE: 08/09/22 Therapeutic Exercise: UBE level 1, 5 min  Supine shoulder flexion red band bilateral arm pull overhead x 10  L UE narrow press , 2 lbs x 15 , 3 lbs x 15  External rotation red band x 15 , minimal motion  Sidelying flexion to 100-110 deg.  X 10  Sidelying external rotation with assist for concentric phase x 15 Single arm row red band standing (bent over row) x 15 (foot anchoring band)  Single arm row standing  row red band x 15  Double arm extension red band x 15  [facility] Adult PT Treatment:                                                DATE:  08/04/22 Therapeutic Exercise: UBE forward/backward 2 mins each direction Seated wrist sup/pro 3# 3x10 Seated elbow flex c wrist pronated 2x10 2# (not able to complete in supination) SL L ER AROM/A,AROM 3x10 SL L abd to 90d 2x8, labored c sh shrug L Shoulder flexion supine to 90d  L shoulder flex 90d, circle each direction x10 each x 2 2# Shoulder row 2x15 RTB Shoulder ext 2x15 RTB  [facility] Adult PT Treatment:                                                DATE: 07/28/22 Therapeutic Exercise: UBE forward/backward 2 mins each direction Seated wrist sup/pro 3# 3x10 Seated elbow flex c wrist pronated 2x10 ( not able to complete in supination) SL L ER AROM/AAROM 3x10 L Shoulder flexion supine to 90d 2x10 L shoulder flex 90d, circle each direction x10 each x 2 2# Shoulder flex c [place] 3x10 Shoulder row 2x15 GTB Shoulder ext 2x15 GTB HEP updated  PATIENT EDUCATION: Education details: Eval findings, POC Person educated: Patient Education method: Explanation, Demonstration, Tactile cues, Verbal cues, and Handouts Education comprehension: verbalized understanding, returned demonstration, verbal cues required, and tactile cues required    HOME EXERCISE PROGRAM: Access Code: [email or web address]  Access Code: [email or web address] URL: [email or web address] Date: 08/09/2022 Prepared by: Raeford Razor  Exercises - Seated Elbow Flexion Shoulder Internal Rotation AAROM at Table with Towel  - 1 x daily - 7 x weekly - 2 sets - 10 reps - Seated Elbow Flexion AAROM  - 1 x daily - 7 x weekly - 2 sets - 10 reps - Sidelying Shoulder External Rotation  - 1 x daily - 7 x weekly - 2 sets - 10 reps - 3 hold - Shoulder Flexion Wall Slide with Towel  - 1 x daily - 7 x weekly - 2 sets - 10 reps - 3 hold - Standing Shoulder Row with Anchored Resistance  - 1 x daily - 7 x weekly - 3 sets - 10 reps - Shoulder extension with resistance - Neutral  - 1 x daily - 7 x weekly - 3 sets - 10 reps - Forearm Pronation and  Supination with Hammer  - 1 x daily - 7 x weekly - 3 sets - 10 reps - Sidelying Shoulder Flexion 15 Degrees  - 1 x daily - 7 x weekly - 2 sets - 10 reps - 5 hold - Standing Bent Over Shoulder Row with Resistance  - 1 x daily - 7 x weekly - 2 sets - 10 reps - 5 hold   ASSESSMENT:   CLINICAL IMPRESSION: Patient continues to have weakness of L UE impacting daily activity and normal movement patterns.  When pronated he can flex elbow.  Able to raise arm to > 100 deg in sidelying.   Added a functional row to his HEP to help him lift items from the floor when working, cleaning. Pain today was minimal to none. Good effort throughout session.  OBJECTIVE IMPAIRMENTS decreased ROM, decreased strength, impaired UE functional use, and pain.    ACTIVITY LIMITATIONS carrying, lifting, dressing, and reach over head   PARTICIPATION LIMITATIONS: cleaning, driving, and overhead activities     GOALS:     SHORT TERM GOALS= LTGs   LONG TERM GOALS: Target date: 08/26/22    Pt will demonstrate 3/5 strength of the L shoulder and biceps for L arm above shoulder reaching without weight and to assist with 2 handed above shoulder lifting with the R arm with a box up to 20# Baseline: biceps 1/5 ,flexion 3-/5  Goal status: ongoing    2.  Pt will demonstrate 100d of AROM for above shoulder function of the L UE Baseline: 60d, status 100 deg in sidelying  Status: 08/04/22= 70d  Goal status: ongoing   3.  Pt will report a decrease in L shoulder pain to 1/10 intermittently for improved function and QOL Baseline: 2/10 Goal status:ongoing     4.  Pt will be Ind in a final HEP to maintain achieved LOF Baseline: started Goal status: Initial HEP is being completed     PLAN: PT FREQUENCY: 2x/week   PT DURATION: 6 weeks   PLANNED INTERVENTIONS: Therapeutic exercises, Therapeutic activity, Patient/Family education, Self Care, Joint mobilization, Aquatic Therapy, Dry Needling, Cryotherapy, Moist heat, Taping,  Ionotophoresis '4mg'$ /ml Dexamethasone, Manual therapy, and Re-evaluation   PLAN FOR NEXT SESSION: cont to strengthen as able with gravity eliminated   Raeford Razor, PT 08/09/22 3:09 PM Phone: [phone] Fax: [phone]

## 2022-08-09 ENCOUNTER — Other Ambulatory Visit: Payer: Self-pay | Admitting: Family Medicine

## 2022-08-09 ENCOUNTER — Encounter: Payer: Self-pay | Admitting: Physical Therapy

## 2022-08-09 ENCOUNTER — Ambulatory Visit: Payer: Medicare HMO | Admitting: Physical Therapy

## 2022-08-09 DIAGNOSIS — G8929 Other chronic pain: Secondary | ICD-10-CM

## 2022-08-09 DIAGNOSIS — M6281 Muscle weakness (generalized): Secondary | ICD-10-CM | POA: Diagnosis not present

## 2022-08-09 DIAGNOSIS — M25612 Stiffness of left shoulder, not elsewhere classified: Secondary | ICD-10-CM

## 2022-08-09 DIAGNOSIS — M25512 Pain in left shoulder: Secondary | ICD-10-CM | POA: Diagnosis not present

## 2022-08-10 NOTE — Therapy (Addendum)
OUTPATIENT PHYSICAL THERAPY TREATMENT NOTE/DIscharge   Patient Name: Ronald Lowe MRN: [id number] DOB:Jul 06, 1956, 66 y.o., male Today's Date: 08/11/2022  PCP: Dorna Mai, MD   REFERRING PROVIDER: Meade Maw, MD  END OF SESSION:   PT End of Session - 08/11/22 1026     Visit Number 8    Number of Visits 13    Date for PT Re-Evaluation 08/26/22    Authorization Type HUMANA MEDICARE HMO; MEDICAID OF [place]    Authorization Time Period 07/07/22-08/20/22; 12 visits    Authorization - Visit Number 7    Authorization - Number of Visits 12    Progress Note Due on Visit 10    PT Start Time 1018    PT Stop Time 1100    PT Time Calculation (min) 42 min    Activity Tolerance Patient tolerated treatment well    Behavior During Therapy WFL for tasks assessed/performed                   Past Medical History:  Diagnosis Date   Atrial fibrillation ([place])    Chronic kidney disease    Hyperlipidemia    Hypertension    Tobacco abuse    Past Surgical History:  Procedure Laterality Date   TONSILLECTOMY     Patient Active Problem List   Diagnosis Date Noted   Foraminal stenosis of cervical region 03/23/2022   Left arm weakness 03/23/2022   Paroxysmal atrial fibrillation ([place]) 12/22/2021   Secondary hypercoagulable state ([place]) 12/22/2021   Medication management 10/20/2021   Chronic obstructive pulmonary disease ([place]) 10/19/2021   Alcohol withdrawal delirium ([place]) 06/17/2021   Atrial fibrillation with rapid ventricular response ([place]) 06/17/2021   Macrocytic anemia 06/17/2021   COPD exacerbation ([place]) 06/14/2021   Multifocal pneumonia    CAP (community acquired pneumonia) 06/13/2021   Emphysema lung ([place]) 03/17/2021   Positive hepatitis C antibody test 10/02/2019   Tobacco dependence 09/24/2019   Alcohol use disorder, mild, abuse 09/24/2019   Orthostatic hypotension 06/12/2014   EKG abnormality 06/12/2014   Elevated blood pressure reading in office with diagnosis of  hypertension 06/12/2014    REFERRING DIAG:  M25.512 (ICD-10-CM) - Pain in left shoulder  G89.29 (ICD-10-CM) - Other chronic pain    THERAPY DIAG:  Muscle weakness (generalized)  Chronic left shoulder pain  Stiffness of left shoulder, not elsewhere classified  Rationale for Evaluation and Treatment Rehabilitation  PERTINENT HISTORY: Not relevant   PRECAUTIONS: None  SUBJECTIVE: Pt reports R shoulder muscle soreness    PAIN:  Are you having pain? Yes: NPRS scale: 2/10 Pain location: L shoulder Pain description: aches Aggravating factors: Certain arm movements Relieving factors: 3 Advils or tylenol   OBJECTIVE: (objective measures completed at initial evaluation unless otherwise dated)   DIAGNOSTIC FINDINGS:  L Shoulder MRI 05/18/22 IMPRESSION: 1. Moderate to high-grade partial-thickness articular sided tearing of the anterior greater than mid aspect of the supraspinatus tendon footprint in a region measuring up to 2.6 cm in AP dimension. 2. Mild-to-moderate supraspinatus and anterior infraspinatus muscle atrophy. 3. Moderate degenerative changes of the acromioclavicular joint. 4. Mild-to-moderate glenohumeral osteoarthritis.    Cervical MRI 03/18/22 IMPRESSION:  1. Severe foraminal stenosis bilaterally at C3-C4, C5-C6 and C6-C7  and on the left at C4-C5. Milder multilevel foraminal stenosis is  detailed above.  2. Moderate canal stenosis at C5-C6 and C6-C7 and mild-to-moderate  canal stenosis at C3-C4.      PATIENT SURVEYS:  FOTO L shoulder TBA. 07/26/22= 43%   COGNITION:  Overall cognitive status: Within functional limits for tasks assessed                                  SENSATION: WFL   POSTURE: Forward head, rounded shoulders   UPPER EXTREMITY ROM:    AROM/AAROM ROM Right eval Left eval LT  Shoulder flexion 135 AROM 60 AROM/ 120 PROM AROM 70d  Shoulder extension       Shoulder abduction       Shoulder adduction       Shoulder  internal rotation       Shoulder external rotation     52 deg  Elbow flexion       Elbow extension       Wrist flexion       Wrist extension       Wrist ulnar deviation       Wrist radial deviation       Wrist pronation       Wrist supination       (Blank rows = not tested)   UPPER EXTREMITY MMT:   MMT Right eval Left eval Lt 08/09/22  Shoulder flexion 5 2/5 2+/5 supine  Shoulder extension 5 3/5   Shoulder abduction 5 2/5 2/5  Shoulder adduction 5 4+/5   Shoulder internal rotation 5 4+/5 4/5  Shoulder external rotation 5 2/5 2+/5  Middle trapezius       Lower trapezius       Elbow flexion     3/5 with pronation , 1/5 with supination  Elbow extension     4+/5  Wrist flexion       Wrist extension       Wrist ulnar deviation       Wrist radial deviation       Wrist pronation       Wrist supination       Grip strength (lbs)       (Blank rows = not tested)   SHOULDER SPECIAL TESTS:            Impingement tests: Hawkins/Kennedy impingement test: negative            Rotator cuff assessment: Drop arm test: positive , Empty can test: positive , and Full can test: positive             All tests were positive for weakness.            Biceps assessment: Speed's Test: Positive for weakness    PALPATION/Observation:  Significant atrophy of the l GH musclutature             TODAY'S TREATMENT:  [facility] Adult PT Treatment:                                                DATE: 08/11/22 Therapeutic Exercise: UBE level 1, 5 min  L UE narrow press , 2 lbs x 15 , 3 lbs x 15  External rotation red band x 15 , minimal motion  Sidelying flexion.  X 10  Sidelying external rotation with assist for concentric phase x 15 SL L abd to 90d 2x8, labored c sh shrug Single arm row red band standing (bent over row) x 15 (foot anchoring band)  Single arm row standing row red band x 15  Double arm extension red band x 15 L arm flexion with wall assist x12  [facility] Adult PT Treatment:                                                 DATE: 08/09/22 Therapeutic Exercise: UBE level 1, 5 min  Supine shoulder flexion red band bilateral arm pull overhead x 10  L UE narrow press , 2 lbs x 15 , 3 lbs x 15  External rotation red band x 15 , minimal motion  Sidelying flexion to 100-110 deg.  X 10  Sidelying external rotation with assist for concentric phase x 15 Single arm row red band standing (bent over row) x 15 (foot anchoring band)  Single arm row standing row red band x 15  Double arm extension red band x 15  [facility] Adult PT Treatment:                                                DATE: 08/04/22 Therapeutic Exercise: UBE forward/backward 2 mins each direction Seated wrist sup/pro 3# 3x10 Seated elbow flex c wrist pronated 2x10 2# (not able to complete in supination) SL L ER AROM/A,AROM 3x10 SL L abd to 90d 2x8, labored c sh shrug L Shoulder flexion supine to 90d  L shoulder flex 90d, circle each direction x10 each x 2 2# Shoulder row 2x15 RTB Shoulder ext 2x15 RTB  PATIENT EDUCATION: Education details: Eval findings, POC Person educated: Patient Education method: Explanation, Demonstration, Tactile cues, Verbal cues, and Handouts Education comprehension: verbalized understanding, returned demonstration, verbal cues required, and tactile cues required    HOME EXERCISE PROGRAM: Access Code: [email or web address]  Access Code: [email or web address] URL: [email or web address] Date: 08/09/2022 Prepared by: Raeford Razor  Exercises - Seated Elbow Flexion Shoulder Internal Rotation AAROM at Table with Towel  - 1 x daily - 7 x weekly - 2 sets - 10 reps - Seated Elbow Flexion AAROM  - 1 x daily - 7 x weekly - 2 sets - 10 reps - Sidelying Shoulder External Rotation  - 1 x daily - 7 x weekly - 2 sets - 10 reps - 3 hold - Shoulder Flexion Wall Slide with Towel  - 1 x daily - 7 x weekly - 2 sets - 10 reps - 3 hold - Standing Shoulder Row with Anchored Resistance  - 1 x daily - 7 x weekly - 3 sets - 10 reps -  Shoulder extension with resistance - Neutral  - 1 x daily - 7 x weekly - 3 sets - 10 reps - Forearm Pronation and Supination with Hammer  - 1 x daily - 7 x weekly - 3 sets - 10 reps - Sidelying Shoulder Flexion 15 Degrees  - 1 x daily - 7 x weekly - 2 sets - 10 reps - 5 hold - Standing Bent Over Shoulder Row with Resistance  - 1 x daily - 7 x weekly - 2 sets - 10 reps - 5 hold   ASSESSMENT:   CLINICAL IMPRESSION: PT was completed for L shoulder/UE strengthening. No significant change in function was noted today. Pt participated with good effort and tolerated without adverse effects. Encourged pt to take a day rest to help with muscle.  Pt will benefit continued PT to address L shoulder deficits to optimize use of the L arm .  OBJECTIVE IMPAIRMENTS decreased ROM, decreased strength, impaired UE functional use, and pain.    ACTIVITY LIMITATIONS carrying, lifting, dressing, and reach over head   PARTICIPATION LIMITATIONS: cleaning, driving, and overhead activities     GOALS:     SHORT TERM GOALS= LTGs   LONG TERM GOALS: Target date: 08/26/22    Pt will demonstrate 3/5 strength of the L shoulder and biceps for L arm above shoulder reaching without weight and to assist with 2 handed above shoulder lifting with the R arm with a box up to 20# Baseline: biceps 1/5 ,flexion 3-/5  Goal status: ongoing    2.  Pt will demonstrate 100d of AROM for above shoulder function of the L UE Baseline: 60d, status 100 deg in sidelying  Status: 08/04/22= 70d  Goal status: ongoing   3.  Pt will report a decrease in L shoulder pain to 1/10 intermittently for improved function and QOL Baseline: 2/10 Goal status:ongoing     4.  Pt will be Ind in a final HEP to maintain achieved LOF Baseline: started Goal status: Initial HEP is being completed     PLAN: PT FREQUENCY: 2x/week   PT DURATION: 6 weeks   PLANNED INTERVENTIONS: Therapeutic exercises, Therapeutic activity, Patient/Family education, Self  Care, Joint mobilization, Aquatic Therapy, Dry Needling, Cryotherapy, Moist heat, Taping, Ionotophoresis 75m/ml Dexamethasone, Manual therapy, and Re-evaluation   PLAN FOR NEXT SESSION: cont to strengthen as able with gravity eliminated  ALiberty MutualMS, PT 08/11/22 3:43 PM  PHYSICAL THERAPY DISCHARGE SUMMARY  Visits from Start of Care: 8  Current functional level related to goals / functional outcomes: See clinical impression and PT goals    Remaining deficits: See clinical impression and PT goals    Education / Equipment: HEP   Patient agrees to discharge. Patient goals were not met. Patient is being discharged due to the pt has been referred for an orthopedic consult and pt has not return for additional PT services.  Lacy Taglieri MS, PT 09/29/22 9:09 AM

## 2022-08-11 ENCOUNTER — Ambulatory Visit: Payer: Medicare HMO

## 2022-08-11 ENCOUNTER — Encounter: Payer: Self-pay | Admitting: Internal Medicine

## 2022-08-11 ENCOUNTER — Ambulatory Visit (INDEPENDENT_AMBULATORY_CARE_PROVIDER_SITE_OTHER): Payer: Medicare HMO | Admitting: Internal Medicine

## 2022-08-11 VITALS — BP 130/80 | HR 89 | Ht 72.0 in | Wt 158.4 lb

## 2022-08-11 DIAGNOSIS — G8929 Other chronic pain: Secondary | ICD-10-CM | POA: Diagnosis not present

## 2022-08-11 DIAGNOSIS — M25612 Stiffness of left shoulder, not elsewhere classified: Secondary | ICD-10-CM

## 2022-08-11 DIAGNOSIS — F17200 Nicotine dependence, unspecified, uncomplicated: Secondary | ICD-10-CM | POA: Diagnosis not present

## 2022-08-11 DIAGNOSIS — M6281 Muscle weakness (generalized): Secondary | ICD-10-CM

## 2022-08-11 DIAGNOSIS — J449 Chronic obstructive pulmonary disease, unspecified: Secondary | ICD-10-CM | POA: Diagnosis not present

## 2022-08-11 DIAGNOSIS — F17210 Nicotine dependence, cigarettes, uncomplicated: Secondary | ICD-10-CM

## 2022-08-11 DIAGNOSIS — J439 Emphysema, unspecified: Secondary | ICD-10-CM | POA: Diagnosis not present

## 2022-08-11 DIAGNOSIS — M25512 Pain in left shoulder: Secondary | ICD-10-CM | POA: Diagnosis not present

## 2022-08-11 DIAGNOSIS — J4489 Other specified chronic obstructive pulmonary disease: Secondary | ICD-10-CM

## 2022-08-11 MED ORDER — TRELEGY ELLIPTA 100-62.5-25 MCG/ACT IN AEPB: 1.0000 | INHALATION_SPRAY | Freq: Every day | RESPIRATORY_TRACT | 5 refills | Status: DC

## 2022-08-11 MED ORDER — ALBUTEROL SULFATE HFA 108 (90 BASE) MCG/ACT IN AERS: 2.0000 | INHALATION_SPRAY | RESPIRATORY_TRACT | 5 refills | Status: DC | PRN

## 2022-08-11 NOTE — Progress Notes (Signed)
Ronald Lowe    [id number]    [birth year]  Primary Care Physician:Wilson, Clyde Canterbury, MD Date of Appointment: 08/11/2022 Established Patient Visit  Chief complaint:   Chief Complaint  Patient presents with   COPD          HPI: Ronald Lowe is a 66 y.o. man with COPD   Interval Updates: Here for follow up after 1 year. At last visit in may 2022 we started incruse inhaler in addition to advair. Using these daily. Had planned to follow up after PFTs which he didn't obtain.   It looks like at some point he was switched to trelegy inhaler. And he seems to be taking this with advair.   Has prn albuterol - use is about twice a day.   No exacerbations or ED visits in the last year.   75 pack year smoking history. Currently cut back to about 1/2 ppd or less.   I have reviewed the patient's family social and past medical history and updated as appropriate.   Past Medical History:  Diagnosis Date   Atrial fibrillation ([place])    Chronic kidney disease    Hyperlipidemia    Hypertension    Tobacco abuse     Past Surgical History:  Procedure Laterality Date   TONSILLECTOMY      Family History  Problem Relation Age of Onset   Emphysema Father    CAD Father        CABG   Stroke Mother    CAD Brother 3       CABG   Hypertension Brother     Social History   Occupational History   Occupation: retired  Tobacco Use   Smoking status: Former    Packs/day: 1.00    Years: 50.00    Total pack years: 50.00    Types: Cigarettes    Start date: 12/19/1970   Smokeless tobacco: Former  Scientific laboratory technician Use: Never used  Substance and Sexual Activity   Alcohol use: Not Currently    Alcohol/week: 42.0 standard drinks of alcohol    Types: 28 Cans of beer, 14 Shots of liquor per week    Comment: did not drink yesterday, was shaky today and wife gave him one shot pta   Drug use: No   Sexual activity: Yes    Partners: Female    Birth control/protection: None      Physical Exam: Blood pressure 130/80, pulse 89, height 6' (1.829 m), weight 158 lb 6.4 oz (71.8 kg), SpO2 96 %.  Gen:      No acute distress ENT:  no nasal polyps, mucus membranes moist Lungs:    No increased respiratory effort, symmetric chest wall excursion, clear to auscultation bilaterally, no wheezes or crackles CV:         Regular rate and rhythm; no murmurs, rubs, or gallops.  No pedal edema   Data Reviewed: Imaging: I have personally reviewed the CT scan July 2023 - negative for malignancy. Next due for san in July 2024.   PFTs:  Labs:  Immunization status: Immunization History  Administered Date(s) Administered   PFIZER(Purple Top)SARS-COV-2 Vaccination 03/20/2020, 04/10/2020   Pneumococcal Conjugate-13 09/24/2019   Tdap 09/24/2019    External Records Personally Reviewed: cardiology  Assessment:  COPD Tobacco use disorder  Plan/Recommendations: Stop taking advair inhaler (purple one.) take trelegy inhaler 1 puff once a day only.  Continue albuterol inhaler as needed.  We should get  some baseline breathing testing on you to  understand where your lung function is at.    We will repeat a CT scan  in July 2024 for your lung cancer screening. The one from this year was clear without cancer.    Return to Care: Return in about 6 months (around 02/11/2023).   Lenice Llamas, MD Pulmonary and [place]

## 2022-08-11 NOTE — Patient Instructions (Addendum)
Please schedule follow up scheduled with myself in 6 months.  If my schedule is not open yet, we will contact you with a reminder closer to that time. Please call [phone] if you haven't heard from [place] a month before.   Before your next visit I would like you to have: PFT - 1 hour - next available.  Stop taking advair inhaler (purple one.) Take trelegy inhaler 1 puff once a day only.  Continue albuterol inhaler as needed.  We should get some baseline breathing testing on you to  understand where your lung function is at.  We will repeat a CT scan  in July 2024 for your lung cancer screening. The one from this year was clear without cancer.

## 2022-08-16 ENCOUNTER — Ambulatory Visit (INDEPENDENT_AMBULATORY_CARE_PROVIDER_SITE_OTHER): Payer: Medicare HMO | Admitting: Neurosurgery

## 2022-08-16 ENCOUNTER — Encounter: Payer: Self-pay | Admitting: Neurosurgery

## 2022-08-16 VITALS — BP 132/82 | Ht 72.0 in | Wt 158.0 lb

## 2022-08-16 DIAGNOSIS — R29898 Other symptoms and signs involving the musculoskeletal system: Secondary | ICD-10-CM | POA: Diagnosis not present

## 2022-08-16 NOTE — Progress Notes (Signed)
Referring Physician:  Dorna Mai, [address] [place],  [place] [address]  Primary Physician:  Dorna Mai, MD  History of Present Illness: 08/16/2022 [name] is here today with a chief complaint of arm pain and weakness moving his left arm.  History of Present Illness: 06/02/2022 Mr. Siordia presents today with slightly improved arm function.  05/03/22 Mr. Andranik Jeune is here today with a chief complaint of left sided neck pain that radiates to his left shoulder and upper arm. He also reports left arm weakness since February.   He has been having trouble since February 2023. He was doing 300 curls with his left arm when he suddenly had discomfort in his left arm and began having weakness. He has had weakness since that time. He describes tenderness in his shoulder and his elbow. He has had difficulty lifting his arm since that time.  Of note, he has no sensory findings. He has no pain in his shoulder blade. He has no pain down his arm. He has no sensory abnormality.  Bowel/Bladder Dysfunction: none  Conservative measures: Physical therapy: has not participated  Multimodal medical therapy including regular antiinflammatories: tylenol, ibuprofen, prednisone  Injections: has not received any epidural steroid injections  Past Surgery: denies  HADRIEL NORTHUP has no symptoms of cervical myelopathy.  The symptoms are causing a significant impact on the patient's life.    Review of Systems:  A 10 point review of systems is negative, except for the pertinent positives and negatives detailed in the HPI.  Past Medical History: Past Medical History:  Diagnosis Date   Atrial fibrillation ([place])    Chronic kidney disease    Hyperlipidemia    Hypertension    Tobacco abuse     Past Surgical History: Past Surgical History:  Procedure Laterality Date   TONSILLECTOMY      Allergies: Allergies as of 08/16/2022   (No Known Allergies)     Medications: Current Meds  Medication Sig   albuterol (VENTOLIN HFA) 108 (90 Base) MCG/ACT inhaler Inhale 2 puffs into the lungs every 4 (four) hours as needed for wheezing or shortness of breath.   amiodarone (PACERONE) 200 MG tablet Take 1 tablet (200 mg total) by mouth daily.   amLODipine (NORVASC) 5 MG tablet Take 1 tablet (5 mg total) by mouth daily.   apixaban (ELIQUIS) 5 MG TABS tablet Take 1 tablet (5 mg total) by mouth 2 (two) times daily.   atorvastatin (LIPITOR) 40 MG tablet Take 1 tablet (40 mg total) by mouth daily.   lisinopril (ZESTRIL) 5 MG tablet Take 1 tablet (5 mg total) by mouth daily.   pantoprazole (PROTONIX) 40 MG tablet TAKE 1 TABLET BY MOUTH EVERY DAY   TRELEGY ELLIPTA 100-62.5-25 MCG/ACT AEPB Take 1 puff by mouth daily.    Social History: Social History   Tobacco Use   Smoking status: Former    Packs/day: 1.00    Years: 50.00    Total pack years: 50.00    Types: Cigarettes    Start date: 12/19/1970   Smokeless tobacco: Former  Scientific laboratory technician Use: Never used  Substance Use Topics   Alcohol use: Not Currently    Alcohol/week: 42.0 standard drinks of alcohol    Types: 28 Cans of beer, 14 Shots of liquor per week    Comment: did not drink yesterday, was shaky today and wife gave him one shot pta   Drug use: No    Family  Medical History: Family History  Problem Relation Age of Onset   Emphysema Father    CAD Father        CABG   Stroke Mother    CAD Brother 19       CABG   Hypertension Brother     Physical Examination: Vitals:   08/16/22 1207  BP: 132/82    General: Patient is well developed, well nourished, calm, collected, and in no apparent distress. Attention to examination is appropriate.  Neck:   Supple.  Full range of motion.  Respiratory: Patient is breathing without any difficulty.   NEUROLOGICAL:     Awake, alert, oriented to person, place, and time.  Speech is clear and fluent. Fund of knowledge is appropriate.    Cranial Nerves: Pupils equal round and reactive to light.  Facial tone is symmetric.  Facial sensation is symmetric. Shoulder shrug is symmetric. Tongue protrusion is midline.  There is no pronator drift.  ROM of spine: full.    Strength: Side Biceps Triceps Deltoid Interossei Grip Wrist Ext. Wrist Flex.  R '5 5 5 5 5 5 5  '$ L 4+ '5 2 5 5 5 5   '$ Side Iliopsoas Quads Hamstring PF DF EHL  R '5 5 5 5 5 5  '$ L '5 5 5 5 5 5   '$ Reflexes are 1+ and symmetric at the biceps, triceps, brachioradialis, patella and achilles.    Gait is normal.     Medical Decision Making  Imaging: MRI C spine 03/18/22 IMPRESSION: 1. Severe foraminal stenosis bilaterally at C3-C4, C5-C6 and C6-C7 and on the left at C4-C5. Milder multilevel foraminal stenosis is detailed above. 2. Moderate canal stenosis at C5-C6 and C6-C7 and mild-to-moderate canal stenosis at C3-C4.     Electronically Signed   By: Margaretha Sheffield M.D.   On: 03/18/2022 14:16  I have personally reviewed the images and agree with the above interpretation.   EMG 04/05/2022 Impression:  The above electrodiagnostic study is ABNORMAL but difficult to  interpret. The study reveals evidence consistent with severe  acute C6 radiculopathy on the left but could also be upper trunk  brachial plexopathy given the reported clinical mechanism of  injury. The patient does have cervical stenosis and foraminal  stenosis on MRI. However his clinical mechanism of injury does  not seem consistent with that. He did not have any viral  prodrome that she would typically see with [place]-Turner  syndrome. There is signs of reinnervation with polyphasic needle  EMG findings. This does bode well for potential recovery.   There is also evidence of a moderate left median nerve entrapment  at the wrist (carpal tunnel syndrome) affecting sensory and motor  components.    Assessment and Plan: Mr. Henrichs is a pleasant 66 y.o. male with multilevel cervical  foraminal stenosis as well as a high-grade rotator cuff injury.  He does not have any radicular symptoms currently.  I would like to send him back for reevaluation by Dr. Jobe Gibbon for his rotator cuff, as his functional range of motion is his biggest issue currently.   I spent a total of 20 minutes in face-to-face and non-face-to-face activities related to this patient's care today.  Thank you for involving me in the care of this patient.      Jonanthan Bolender K. Izora Ribas MD, [facility] Neurosurgery

## 2022-08-18 ENCOUNTER — Other Ambulatory Visit: Payer: Self-pay

## 2022-08-18 NOTE — Patient Outreach (Signed)
[facility]) Care Management  08/18/2022  Ronald Lowe [birth year] [id number]   RN CM closing case:  pt will be followed for care coordination by [facility].  [name], RN, MSN [facility] Care Management Care Management Coordinator Direct Line [phone] Toll Free: [phone]  Fax: [phone]

## 2022-08-25 ENCOUNTER — Ambulatory Visit: Payer: Medicare HMO | Admitting: Physical Therapy

## 2022-08-29 ENCOUNTER — Telehealth: Payer: Self-pay

## 2022-08-29 DIAGNOSIS — R29898 Other symptoms and signs involving the musculoskeletal system: Secondary | ICD-10-CM

## 2022-08-29 NOTE — Telephone Encounter (Signed)
-----   Message from Peggyann Shoals sent at 08/29/2022  1:50 PM EDT ----- Regarding: ortho referral Dr.Yarbrough was supposed to refer him to Dr.Poggi for his shoulder. I don't see a referral.

## 2022-08-29 NOTE — Telephone Encounter (Signed)
Referral faxed to [facility].  

## 2022-09-01 ENCOUNTER — Encounter: Payer: Medicare HMO | Admitting: Physical Therapy

## 2022-09-06 DIAGNOSIS — C641 Malignant neoplasm of right kidney, except renal pelvis: Secondary | ICD-10-CM | POA: Diagnosis not present

## 2022-09-06 DIAGNOSIS — Z125 Encounter for screening for malignant neoplasm of prostate: Secondary | ICD-10-CM | POA: Diagnosis not present

## 2022-09-08 ENCOUNTER — Encounter: Payer: Medicare HMO | Admitting: Physical Therapy

## 2022-09-13 DIAGNOSIS — I700 Atherosclerosis of aorta: Secondary | ICD-10-CM | POA: Diagnosis not present

## 2022-09-13 DIAGNOSIS — M4317 Spondylolisthesis, lumbosacral region: Secondary | ICD-10-CM | POA: Diagnosis not present

## 2022-09-13 DIAGNOSIS — N2889 Other specified disorders of kidney and ureter: Secondary | ICD-10-CM | POA: Diagnosis not present

## 2022-09-13 DIAGNOSIS — Z85528 Personal history of other malignant neoplasm of kidney: Secondary | ICD-10-CM | POA: Diagnosis not present

## 2022-09-13 DIAGNOSIS — C641 Malignant neoplasm of right kidney, except renal pelvis: Secondary | ICD-10-CM | POA: Diagnosis not present

## 2022-09-19 ENCOUNTER — Ambulatory Visit: Payer: Medicare HMO | Attending: Cardiovascular Disease | Admitting: Cardiovascular Disease

## 2022-09-20 DIAGNOSIS — N5201 Erectile dysfunction due to arterial insufficiency: Secondary | ICD-10-CM | POA: Diagnosis not present

## 2022-09-20 DIAGNOSIS — C641 Malignant neoplasm of right kidney, except renal pelvis: Secondary | ICD-10-CM | POA: Diagnosis not present

## 2022-10-03 ENCOUNTER — Telehealth: Payer: Self-pay

## 2022-10-03 NOTE — Patient Instructions (Signed)
Visit Information  Thank you for taking time to visit with me today. Please don't hesitate to contact me if I can be of assistance to you.   Following are the goals we discussed today:   Goals Addressed             This Visit's Progress    Hypertension Management       Care Coordination Interventions: Evaluation of current treatment plan related to hypertension self management and patient's adherence to plan as established by provider Reviewed medications with patient and discussed importance of compliance Discussed plans with patient for ongoing care management follow up and provided patient with direct contact information for care management team Patient reports blood pressure less than 140/80.            Our next appointment is by telephone on 01-03-22 at 1000  Please call the care guide team at [phone] if you need to cancel or reschedule your appointment.   If you are experiencing a Mental Health or [place] or need someone to talk to, please call the Suicide and Crisis Lifeline: 988   Patient verbalizes understanding of instructions and care plan provided today and agrees to view in [place]. Active MyChart status and patient understanding of how to access instructions and care plan via MyChart confirmed with patient.     Telephone follow up appointment with care management team member scheduled for: January  Cataldo Cosgriff J Sharece Fleischhacker, RN, MSN [facility] Management Care Management Coordinator Direct Line [phone]

## 2022-10-03 NOTE — Patient Outreach (Signed)
  Care Coordination   Follow Up Visit Note   10/03/2022 Name: Ronald Lowe MRN: [id number] DOB: [birth year]  Ronald Lowe is a 66 y.o. year old male who sees Ronald Mai, MD for primary care. I spoke with  Ronald Lowe by phone today.  What matters to the patients health and wellness today?  none    Goals Addressed             This Visit's Progress    Hypertension Management       Care Coordination Interventions: Evaluation of current treatment plan related to hypertension self management and patient's adherence to plan as established by provider Reviewed medications with patient and discussed importance of compliance Discussed plans with patient for ongoing care management follow up and provided patient with direct contact information for care management team Patient reports blood pressure less than 140/80.            SDOH assessments and interventions completed:  Yes     Care Coordination Interventions Activated:  Yes  Care Coordination Interventions:  Yes, provided   Follow up plan: Follow up call scheduled for January    Encounter Outcome:  Pt. Visit Completed   Ronald Baseman, RN, MSN [place] Management Care Management Coordinator Direct Line [phone]

## 2022-10-10 DIAGNOSIS — M7522 Bicipital tendinitis, left shoulder: Secondary | ICD-10-CM | POA: Diagnosis not present

## 2022-10-10 DIAGNOSIS — M7582 Other shoulder lesions, left shoulder: Secondary | ICD-10-CM | POA: Diagnosis not present

## 2022-10-10 DIAGNOSIS — M75122 Complete rotator cuff tear or rupture of left shoulder, not specified as traumatic: Secondary | ICD-10-CM | POA: Diagnosis not present

## 2022-10-10 DIAGNOSIS — G8929 Other chronic pain: Secondary | ICD-10-CM | POA: Diagnosis not present

## 2022-10-10 DIAGNOSIS — M25512 Pain in left shoulder: Secondary | ICD-10-CM | POA: Diagnosis not present

## 2022-11-01 ENCOUNTER — Other Ambulatory Visit: Payer: Self-pay | Admitting: Nurse Practitioner

## 2022-11-01 DIAGNOSIS — J439 Emphysema, unspecified: Secondary | ICD-10-CM

## 2022-11-02 ENCOUNTER — Other Ambulatory Visit: Payer: Self-pay | Admitting: Surgery

## 2022-11-03 ENCOUNTER — Other Ambulatory Visit: Payer: Medicare HMO

## 2022-11-07 ENCOUNTER — Encounter
Admission: RE | Admit: 2022-11-07 | Discharge: 2022-11-07 | Disposition: A | Discharge status: Home / Self Care | Payer: Medicare HMO | Source: Ambulatory Visit | Attending: Surgery | Admitting: Surgery

## 2022-11-07 ENCOUNTER — Encounter: Payer: Self-pay | Admitting: Urgent Care

## 2022-11-07 ENCOUNTER — Telehealth: Payer: Self-pay | Admitting: *Deleted

## 2022-11-07 VITALS — Ht 72.0 in | Wt 160.0 lb

## 2022-11-07 DIAGNOSIS — R9431 Abnormal electrocardiogram [ECG] [EKG]: Secondary | ICD-10-CM | POA: Diagnosis not present

## 2022-11-07 DIAGNOSIS — I10 Essential (primary) hypertension: Secondary | ICD-10-CM

## 2022-11-07 DIAGNOSIS — I951 Orthostatic hypotension: Secondary | ICD-10-CM

## 2022-11-07 DIAGNOSIS — M75122 Complete rotator cuff tear or rupture of left shoulder, not specified as traumatic: Secondary | ICD-10-CM | POA: Diagnosis not present

## 2022-11-07 DIAGNOSIS — I4891 Unspecified atrial fibrillation: Secondary | ICD-10-CM | POA: Diagnosis not present

## 2022-11-07 DIAGNOSIS — I480 Paroxysmal atrial fibrillation: Secondary | ICD-10-CM | POA: Diagnosis not present

## 2022-11-07 DIAGNOSIS — Z01810 Encounter for preprocedural cardiovascular examination: Secondary | ICD-10-CM | POA: Diagnosis not present

## 2022-11-07 DIAGNOSIS — J441 Chronic obstructive pulmonary disease with (acute) exacerbation: Secondary | ICD-10-CM | POA: Diagnosis not present

## 2022-11-07 DIAGNOSIS — Z01818 Encounter for other preprocedural examination: Secondary | ICD-10-CM | POA: Diagnosis not present

## 2022-11-07 HISTORY — DX: Atherosclerosis of aorta: I70.0

## 2022-11-07 HISTORY — DX: Long term (current) use of anticoagulants: Z79.01

## 2022-11-07 HISTORY — DX: Chronic obstructive pulmonary disease, unspecified: J44.9

## 2022-11-07 HISTORY — DX: Spinal stenosis, cervical region: M48.02

## 2022-11-07 HISTORY — DX: Paroxysmal atrial fibrillation: I48.0

## 2022-11-07 HISTORY — DX: Atherosclerotic heart disease of native coronary artery without angina pectoris: I25.10

## 2022-11-07 LAB — BASIC METABOLIC PANEL
Anion gap: 9 (ref 5–15)
BUN: 14 mg/dL (ref 8–23)
CO2: 27 mmol/L (ref 22–32)
Calcium: 9 mg/dL (ref 8.9–10.3)
Chloride: 103 mmol/L (ref 98–111)
Creatinine, Ser: 1.04 mg/dL (ref 0.61–1.24)
GFR, Estimated: >60 mL/min (ref >60)
Glucose, Bld: 92 mg/dL (ref 70–99)
Potassium: 3.9 mmol/L (ref 3.5–5.1)
Sodium: 139 mmol/L (ref 135–145)

## 2022-11-07 LAB — CBC
HCT: 47.9 % (ref 39.0–52.0)
Hemoglobin: 17 g/dL (ref 13.0–17.0)
MCH: 35.3 pg — ABNORMAL HIGH (ref 26.0–34.0)
MCHC: 35.5 g/dL (ref 30.0–36.0)
MCV: 99.4 fL (ref 80.0–100.0)
Platelets: 223 10*3/uL (ref 150–400)
RBC: 4.82 MIL/uL (ref 4.22–5.81)
RDW: 13.4 % (ref 11.5–15.5)
WBC: 5.3 10*3/uL (ref 4.0–10.5)
nRBC: 0 % (ref 0.0–0.2)

## 2022-11-07 NOTE — Telephone Encounter (Signed)
Patient with diagnosis of A Fib on Eliquis for anticoagulation.    Procedure: LEFT SHOULDER ARTHROSCOPY WITH DEBRIDEMENT, DECOMPRESSION, ROTATOR CUFF REPAIR AND BICEPS TENODESIS   Date of procedure: 11/16/22   CHA2DS2-VASc Score = 2  This indicates a 2.2% annual risk of stroke. The patient's score is based upon: CHF History: 0 HTN History: 1 Diabetes History: 0 Stroke History: 0 Vascular Disease History: 0 Age Score: 1 Gender Score: 0    CrCl 76 mL/min Platelet count 280K   Per office protocol, patient can hold Eliquis for 2 days prior to procedure.    **This guidance is not considered finalized until pre-operative APP has relayed final recommendations.**

## 2022-11-07 NOTE — Telephone Encounter (Signed)
I s/w both the pt and his wife who agree to tele pre op appt 11/14/22 @ 10:40, med rec and consent are done. Pt's wife tells me that she stopped the pt's blood thinner about 3 months ago, that it was making the pt dizzy. I asked if she d/w Dr. [name], she answered no. I explained to her that the pt is at a high risk for stroke or blood clots not being on the blood thinner, she replied "I know they got on me about that as well".      Patient Consent for Virtual Visit        Ronald Lowe has provided verbal consent on 11/07/2022 for a virtual visit (video or telephone).   CONSENT FOR VIRTUAL VISIT FOR:  Ronald Lowe  By participating in this virtual visit I agree to the following:  I hereby voluntarily request, consent and authorize [facility] and its employed or contracted physicians, physician assistants, nurse practitioners or other licensed health care professionals (the Practitioner), to provide me with telemedicine health care services (the "Services") as deemed necessary by the treating Practitioner. I acknowledge and consent to receive the Services by the Practitioner via telemedicine. I understand that the telemedicine visit will involve communicating with the Practitioner through live audiovisual communication technology and the disclosure of certain medical information by electronic transmission. I acknowledge that I have been given the opportunity to request an in-person assessment or other available alternative prior to the telemedicine visit and am voluntarily participating in the telemedicine visit.  I understand that I have the right to withhold or withdraw my consent to the use of telemedicine in the course of my care at any time, without affecting my right to future care or treatment, and that the Practitioner or I may terminate the telemedicine visit at any time. I understand that I have the right to inspect all information obtained and/or recorded in the course of  the telemedicine visit and may receive copies of available information for a reasonable fee.  I understand that some of the potential risks of receiving the Services via telemedicine include:  Delay or interruption in medical evaluation due to technological equipment failure or disruption; Information transmitted may not be sufficient (e.g. poor resolution of images) to allow for appropriate medical decision making by the Practitioner; and/or  In rare instances, security protocols could fail, causing a breach of personal health information.  Furthermore, I acknowledge that it is my responsibility to provide information about my medical history, conditions and care that is complete and accurate to the best of my ability. I acknowledge that Practitioner's advice, recommendations, and/or decision may be based on factors not within their control, such as incomplete or inaccurate data provided by me or distortions of diagnostic images or specimens that may result from electronic transmissions. I understand that the practice of medicine is not an exact science and that Practitioner makes no warranties or guarantees regarding treatment outcomes. I acknowledge that a copy of this consent can be made available to me via my patient portal ([facility]), or I can request a printed copy by calling the office of [facility].    I understand that my insurance will be billed for this visit.   I have read or had this consent read to me. I understand the contents of this consent, which adequately explains the benefits and risks of the Services being provided via telemedicine.  I have been provided ample opportunity to ask questions  regarding this consent and the Services and have had my questions answered to my satisfaction. I give my informed consent for the services to be provided through the use of telemedicine in my medical care

## 2022-11-07 NOTE — Patient Instructions (Addendum)
Your procedure is scheduled on: Wednesday November 16, 2022. Report to Day Surgery inside Medical 2nd floor, stop by registration desk before getting on elevator. To find out your arrival time please call [phone] between 1PM - 3PM on Tuesday November 15, 2022.  Remember: Instructions that are not followed completely may result in serious medical risk,  up to and including death, or upon the discretion of your surgeon and anesthesiologist your  surgery may need to be rescheduled.     _X__ 1. Do not eat food after midnight the night before your procedure.                 No chewing gum or hard candies. You may drink clear liquids up to 2 hours                 before you are scheduled to arrive for your surgery- DO not drink clear                 liquids within 2 hours of the start of your surgery.                 Clear Liquids include:  water, apple juice without pulp, clear Gatorade, G2 or                  Gatorade Zero (avoid Red/Purple/Blue), Black Coffee or Tea (Do not add                 anything to coffee or tea).  __X__2.   Complete the "Ensure Clear Pre-surgery Clear Carbohydrate Drink" provided to you, 2 hours before arrival. **If you       are diabetic you will be provided with an alternative drink, Gatorade Zero or G2.  __X__3.  On the morning of surgery brush your teeth with toothpaste and water, you                may rinse your mouth with mouthwash if you wish.  Do not swallow any toothpaste or mouthwash.     _X__ 4.  No Alcohol for 24 hours before or after surgery.   _X__ 5.  Do Not Smoke or use e-cigarettes For 24 Hours Prior to Your Surgery.                 Do not use any chewable tobacco products for at least 6 hours prior to                 Surgery.  _X__  6.  Do not use any recreational drugs (marijuana, cocaine, heroin, ecstasy, MDMA or other)                For at least one week prior to your surgery.  Combination of these drugs with  anesthesia                May have life threatening results.  ____  7.  Bring all medications with you on the day of surgery if instructed.   __X__8.  Notify your doctor if there is any change in your medical condition      (cold, fever, infections).     Do not wear jewelry, make-up, hairpins, clips or nail polish. Do not wear lotions, powders, or perfumes. You may wear deodorant. Do not shave 48 hours prior to surgery. Men may shave face and neck. Do not bring valuables to the hospital.    [facility] is not responsible for any belongings  or valuables.  Contacts, dentures or bridgework may not be worn into surgery. Leave your suitcase in the car. After surgery it may be brought to your room. For patients admitted to the hospital, discharge time is determined by your treatment team.   Patients discharged the day of surgery will not be allowed to drive home.   Make arrangements for someone to be with you for the first 24 hours of your Same Day Discharge.   ____ Take these medicines the morning of surgery with A SIP OF WATER:    1. amiodarone (PACERONE) 200 MG   2. amLODipine (NORVASC) 5 MG   3. pantoprazole (PROTONIX) 40 MG   4.  5.  6.  ____ Fleet Enema (as directed)   __X__ Use CHG Soap (or wipes) as directed  ____ Use Benzoyl Peroxide Gel as instructed  __X__ Use inhalers on the day of surgery  albuterol (VENTOLIN HFA) 108 (90 Base) MCG/ACT inhaler   TRELEGY ELLIPTA 100-62.5-25 MCG/ACT AEPB   ____ Stop metformin 2 days prior to surgery    ____ Take 1/2 of usual insulin dose the night before surgery. No insulin the morning          of surgery.   ____ Call your PCP, cardiologist, or Pulmonologist if taking Coumadin/Plavix/aspirin and ask when to stop before your surgery.   __X__ One Week prior to surgery- Stop Anti-inflammatories such as Ibuprofen, Aleve, Advil, Motrin, meloxicam (MOBIC), diclofenac, etodolac, ketorolac, Toradol, Daypro, piroxicam, Goody's or BC  powders. OK TO USE TYLENOL IF NEEDED   __X__ Stop supplements until after surgery.    ____ Bring C-Pap to the hospital.    If you have any questions regarding your pre-procedure instructions,  Please call Pre-admit Testing at [phone]

## 2022-11-07 NOTE — Telephone Encounter (Signed)
   Name: Ronald Lowe  DOB: 1956/08/03  MRN: [id number]  Primary Cardiologist: Jenkins Rouge, MD   Preoperative team, please contact this patient and set up a phone call appointment for further preoperative risk assessment. Please obtain consent and complete medication review. Thank you for your help.  I confirm that guidance regarding antiplatelet and oral anticoagulation therapy has been completed and, if necessary, noted below.  Pharmacy has addressed anticoagulation request   Deberah Pelton, NP 11/07/2022, 4:19 PM [place]

## 2022-11-07 NOTE — Telephone Encounter (Signed)
I s/w both the pt and his wife who agree to tele pre op appt 11/14/22 @ 10:40, med rec and consent are done. Pt's wife tells me that she stopped the pt's blood thinner about 3 months ago, that it was making the pt dizzy. I asked if she d/w Dr. [name], she answered no. I explained to her that the pt is at a high risk for stroke or blood clots not being on the blood thinner, she replied "I know they got on me about that as well".

## 2022-11-07 NOTE — Telephone Encounter (Signed)
-----   Message from Karen Kitchens, NP sent at 11/07/2022 12:18 PM EST ----- Regarding: Request for pre-operative cardiac clearance Request for pre-operative cardiac clearance:  1. What type of surgery is being performed?  LEFT SHOULDER ARTHROSCOPY WITH DEBRIDEMENT, DECOMPRESSION, ROTATOR CUFF REPAIR AND BICEPS TENODESIS  2. When is this surgery scheduled?  11/16/2022  3. Type of clearance being requested (medical, pharmacy, both)? BOTH   4. Are there any medications that need to be held prior to surgery? APIXABAN  5. Practice name and name of physician performing surgery?  Performing surgeon: Dr. Milagros Evener, MD Requesting clearance: Honor Loh, FNP-C    6. Anesthesia type (none, local, MAC, general)? GENERAL  7. What is the office phone and fax number?   Phone: [phone] Fax: [phone]  ATTENTION: Unable to create telephone message as per your standard workflow. Directed by [facility] providers to send requests for cardiac clearance to this pool for appropriate distribution to provider covering pre-operative clearances.   Honor Loh, MSN, APRN, FNP-C, CEN [facility]  Peri-operative Services Nurse Practitioner Phone: [phone] 11/07/22 12:18 PM

## 2022-11-08 ENCOUNTER — Other Ambulatory Visit: Payer: Self-pay | Admitting: Cardiovascular Disease

## 2022-11-09 ENCOUNTER — Encounter: Payer: Self-pay | Admitting: Surgery

## 2022-11-09 NOTE — Progress Notes (Incomplete)
Perioperative Services  Pre-Admission/Anesthesia Testing Clinical Review  Date: 11/09/22  Patient Demographics:  Name: Ronald Lowe DOB:   November 04, 1956 MRN:   [id number]  Planned Surgical Procedure(s):    Case: [id number] Date/Time: 11/16/22 0715   Procedure: SHOULDER ARTHROSCOPY WITH DEBRIDEMENT, DECOMPRESSION, ROTATOR CUFF REPAIR AND BICEPS TENODESIS. (Left: Shoulder)   Anesthesia type: Choice   Pre-op diagnosis:      Chronic left shoulder pain M25.512, G89.29     Nontraumatic complete tear of left rotator cuff M75.122     Rotator cuff tendinitis, left M75.82     Tendinitis of upper biceps tendon of left shoulder M75.22   Location: [facility] OR ROOM 02 / [place] [facility]   Surgeons: Corky Mull, MD   NOTE: Available PAT nursing documentation and vital signs have been reviewed. Clinical nursing staff has updated patient's PMH/PSHx, current medication list, and drug allergies/intolerances to ensure comprehensive history available to assist in medical decision making as it pertains to the aforementioned surgical procedure and anticipated anesthetic course. Extensive review of available clinical information performed. [place] PM[facility] and PSHx updated with any diagnoses/procedures that  may have been inadvertently omitted during his intake with the pre-admission testing department's nursing staff.  Clinical Discussion:  Ronald Lowe is a 66 y.o. male who is submitted for pre-surgical anesthesia review and clearance prior to him undergoing the above procedure. Patient is a Former Smoker (12.5 pack years). Pertinent PMH includes: CAD, PAF, aortic atherosclerosis, HTN, HLD, CKD, GERD (on daily PPI), COPD, ETOH and tobacco abuse, medical noncompliance.  Patient is followed by cardiology [name], MD). He was last seen in the cardiology clinic on 06/03/2022; notes reviewed.  At the time of his clinic visit, patient stable from a cardiovascular perspective.  He denied any episodes of  significant chest pains, shortness breath, PND, orthopnea, palpitations, significant peripheral edema, vertiginous symptoms, or presyncope/syncope. Patient continued to consume excessive amounts of ETOH on a weekly basis (28 + standard drinks/week). Patient found to be noncompliant with most of his prescribed medical therapies. Despite education regarding increased risks for lung cancer, cirrhosis, stroke, AECOPD, etc., patient advising that he was not taking his antiarrhythmics, antihypertensives, statins, anticoagulation therapy, or COPD medications as prescribed. Various reasons cited for each of these medications.  Patient with a past medical history significant for cardiovascular diagnoses.  Most recent TTE was performed on 06/18/2021 revealing a low normal left ventricular systolic function with an EF of 50-55%.  There were no regional wall motion abnormalities. Left ventricular diastolic Doppler parameters consistent with abnormal relaxation (G1DD).  Right ventricular size and function normal.  There was trivial mitral and aortic valve regurgitation.  There was no evidence of any significant transvalvular gradient suggestive of valvular stenosis.  Patient with an atrial fibrillation diagnosis; CHA2DS2-VASc Score = 3 (age, HTN, vascular disease history). He has been prescribed oral amiodarone + diltiazem for rate and rhythm management, in addition to apixaban for chronic oral anticoagulation. Blood pressure was significantly elevated at 178/98 mmHg.  Patient not routinely taking prescribed antiarrhythmics, antihypertensives, or anticoagulation.  Increased risk for CVA was discussed given patient's diagnosis of atrial fibrillation, HTN, and excessive ETOH use.  Patient is not diabetic. Patient does not have an OSAH diagnosis. Functional capacity, as defined by DASI, is documented as being >/= 4 METS.  Patient was advised to resume all previously prescribed therapies.  No new medications were initiated  during this visit with cardiology.  Patient to follow-up in 3 months or sooner if needed.  Ronald Lowe is scheduled for an elective LEFT SHOULDER ARTHROSCOPY WITH DEBRIDEMENT, DECOMPRESSION, ROTATOR CUFF REPAIR AND BICEPS TENODESIS on 11/16/2022 with Dr. Milagros Evener, MD.  Given patient's past medical history significant for cardiovascular diagnoses, presurgical cardiac clearance was sought by the PAT team. ***.  Again, patient has been prescribed daily oral anticoagulation therapy for his PAF diagnosis, however he is medically noncompliant.  During his PAT interview, patient reported that he has been off his medication for several months due to him attributing his vertiginous symptoms to this medication.  Risk of medical noncompliance were discussed by PAT staff.  Information was also shared with primary cardiology team so that this could be discussed during his upcoming preoperative clearance visit and/or future visits with attending cardiologist.  Patient denies previous perioperative complications with anesthesia in the past. In review of the EMR, there are no records available for review regarding patient's past surgical/anesthetic courses within the [facility] system.     11/07/2022   10:37 AM 08/16/2022   12:07 PM 08/11/2022   11:34 AM  Vitals with BMI  Height '6\' 0"'$  '6\' 0"'$  '6\' 0"'$   Weight 160 lbs 158 lbs 158 lbs 6 oz  BMI 21.7 93.23 55.73  Systolic  220 254  Diastolic  82 80  Pulse   89   Providers/Specialists:   NOTE: Primary physician provider listed below. Patient may have been seen by APP or partner within same practice.   PROVIDER ROLE / SPECIALTY LAST OV  Poggi, Marshall Cork, MD Orthopedics (Surgeon) 11/07/2022  Dorna Mai, MD Primary Care Provider 03/14/2022  Jenkins Rouge, MD Cardiology 06/03/2022   Allergies:  Patient has no known allergies.  Current Home Medications:   No current facility-administered medications for this encounter.    albuterol (VENTOLIN HFA) 108 (90  Base) MCG/ACT inhaler   amiodarone (PACERONE) 200 MG tablet   amLODipine (NORVASC) 5 MG tablet   apixaban (ELIQUIS) 5 MG TABS tablet   atorvastatin (LIPITOR) 40 MG tablet   diltiazem (CARDIZEM CD) 240 MG 24 hr capsule   ibuprofen (ADVIL) 200 MG tablet   lisinopril (ZESTRIL) 5 MG tablet   metoprolol tartrate (LOPRESSOR) 25 MG tablet   pantoprazole (PROTONIX) 40 MG tablet   PARoxetine (PAXIL) 20 MG tablet   TRELEGY ELLIPTA 100-62.5-25 MCG/ACT AEPB   History:   Past Medical History:  Diagnosis Date   Aortic atherosclerosis (HCC)    Cervical spinal stenosis    Chronic kidney disease    COPD (chronic obstructive pulmonary disease) (HCC)    Coronary artery calcification seen on CT scan    ETOH abuse    a.) 28+ standard drinks/week   GERD (gastroesophageal reflux disease)    History of medication noncompliance    a.) reported 10/2022 that he has been off NOAC for several months secondary to vertiginous symptoms; b.) off/not taking as prescribed: antiarrhythmics, antihypertensives, statins, and MDIs   Hyperlipidemia    Hypertension    Long term current use of amiodarone    Long term current use of anticoagulant    a.) apixaban   PAF (paroxysmal atrial fibrillation) ([place])    a.) CHA2DS2VASc = 3 (age, HTN, vascular disease history);  b.) rate/rhythm maintained on oral amiodarone + diltiazem; chronically anticoagulated with apixaban   Tobacco abuse    Past Surgical History:  Procedure Laterality Date   TONSILLECTOMY     Family History  Problem Relation Age of Onset   Emphysema Father    CAD Father  CABG   Stroke Mother    CAD Brother 43       CABG   Hypertension Brother    Social History   Tobacco Use   Smoking status: Former    Packs/day: 0.25    Years: 50.00    Total pack years: 12.50    Types: Cigarettes    Start date: 12/19/1970   Smokeless tobacco: Former  Scientific laboratory technician Use: Never used  Substance Use Topics   Alcohol use: Yes    Alcohol/week: 28.0  standard drinks of alcohol    Types: 14 Cans of beer, 14 Shots of liquor per week    Comment: did not drink yesterday, was shaky today and wife gave him one shot pta   Drug use: No    Pertinent Clinical Results:  LABS: Labs reviewed: Acceptable for surgery.  Hospital Outpatient Visit on 11/07/2022  Component Date Value Ref Range Status   Sodium 11/07/2022 139  135 - 145 mmol/L Final   Potassium 11/07/2022 3.9  3.5 - 5.1 mmol/L Final   Chloride 11/07/2022 103  98 - 111 mmol/L Final   CO2 11/07/2022 27  22 - 32 mmol/L Final   Glucose, Bld 11/07/2022 92  70 - 99 mg/dL Final   Glucose reference range applies only to samples taken after fasting for at least 8 hours.   BUN 11/07/2022 14  8 - 23 mg/dL Final   Creatinine, Ser 11/07/2022 1.04  0.61 - 1.24 mg/dL Final   Calcium 11/07/2022 9.0  8.9 - 10.3 mg/dL Final   GFR, Estimated 11/07/2022 >60  >60 mL/min Final   Comment: (NOTE) Calculated using the CKD-EPI Creatinine Equation (2021)    Anion gap 11/07/2022 9  5 - 15 Final   Performed at [facility], [address] [place], [place] [address]   WBC 11/07/2022 5.3  4.0 - 10.5 K/uL Final   RBC 11/07/2022 4.82  4.22 - 5.81 MIL/uL Final   Hemoglobin 11/07/2022 17.0  13.0 - 17.0 g/dL Final   HCT 11/07/2022 47.9  39.0 - 52.0 % Final   MCV 11/07/2022 99.4  80.0 - 100.0 fL Final   MCH 11/07/2022 35.3 (H)  26.0 - 34.0 pg Final   MCHC 11/07/2022 35.5  30.0 - 36.0 g/dL Final   RDW 11/07/2022 13.4  11.5 - 15.5 % Final   Platelets 11/07/2022 223  150 - 400 K/uL Final   nRBC 11/07/2022 0.0  0.0 - 0.2 % Final   Performed at [facility], [address] [place], [place] [address]    ECG: Date: 11/07/2022 Time ECG obtained: 1311 PM Rate: 90 bpm Rhythm: normal sinus Axis (leads I and aVF): Normal Intervals: PR 146 ms. QRS 102 ms. QTc 477 ms. ST segment and T wave changes: No evidence of acute ST segment elevation or depression.  Evidence of an age undetermined anterior  infarct present. Comparison: Similar to previous tracing obtained on 12/22/2021   IMAGING / PROCEDURES: DIAGNOSTIC RADIOGRAPHS OF LEFT SHOULDER COMPLETE MINIMUM 2 VIEWS performed on 10/10/2022 Mild degenerative changes as manifest by mild joint space narrowing inferiorly, best seen on the AP view.   The subacromial space is well-maintained.   There is no subacromial or infra-clavicular spurring.   He demonstrates a Type II acromion.   CT CHEST LUNG CANCER SCREENING LOW DOSE WO CONTRAST performed on 07/18/2022 Lung RADS 1-negative.  Continue annual screening with low-dose chest CT without contrast in 12 months. Aortic atherosclerosis Coronary artery calcification Emphysema  MR BRACHIAL PLEXUS W/O CM LT performed on 05/18/2022 No abnormality is seen along the course of the LEFT brachial plexus Moderate degenerative changes of the LEFT sternoclavicular and acromioclavicular joints No acute LEFT clavicle fracture  MR SHOULDER LEFT WO CONTRAST performed on 05/18/2022 Moderate to high-grade partial-thickness articular sided tearing of the anterior greater than mid aspect of the supraspinatus tendon footprint in a region measuring up to 2.6 cm in AP dimension. Mild-to-moderate supraspinatus and anterior infraspinatus muscle atrophy. Moderate degenerative changes of the acromioclavicular joint. Mild-to-moderate glenohumeral osteoarthritis.  TRANSTHORACIC ECHOCARDIOGRAM performed on 06/18/2021 Left ventricular ejection fraction, by estimation, is 50 to 55%. The left ventricle has low normal function. The left ventricle has no regional wall motion abnormalities. Left ventricular diastolic parameters are consistent with Grade I diastolic dysfunction (impaired relaxation).  Right ventricular systolic function is normal. The right ventricular size is normal.  The mitral valve was not well visualized. Trivial mitral valve regurgitation.  The aortic valve was not well visualized. Aortic valve  regurgitation is trivial.   Impression and Plan:  Ronald Lowe has been referred for pre-anesthesia review and clearance prior to him undergoing the planned anesthetic and procedural courses. Available labs, pertinent testing, and imaging results were personally reviewed by me. This patient has been appropriately cleared by cardiology with an overall *** risk of significant perioperative cardiovascular complications.  Based on clinical review performed today (11/09/22), barring any significant acute changes in the patient's overall condition, it is anticipated that he will be able to proceed with the planned surgical intervention. Any acute changes in clinical condition may necessitate his procedure being postponed and/or cancelled. Patient will meet with anesthesia team (MD and/or CRNA) on the day of his procedure for preoperative evaluation/assessment. Questions regarding anesthetic course will be fielded at that time.   Pre-surgical instructions were reviewed with the patient during his PAT appointment and questions were fielded by PAT clinical staff. Patient was advised that if any questions or concerns arise prior to his procedure then he should return a call to PAT and/or his surgeon's office to discuss.  Honor Loh, MSN, APRN, FNP-C, CEN [facility]  Peri-operative Services Nurse Practitioner Phone: [phone] Fax: [phone] 11/09/22 7:54 AM  NOTE: This note has been prepared using Dragon dictation software. Despite my best ability to proofread, there is always the potential that unintentional transcriptional errors may still occur from this process.

## 2022-11-14 ENCOUNTER — Encounter: Payer: Self-pay | Admitting: Physician Assistant

## 2022-11-14 ENCOUNTER — Ambulatory Visit: Payer: Medicare HMO | Admitting: Physician Assistant

## 2022-11-14 NOTE — Progress Notes (Addendum)
    Chart reviewed in preparation for virtual preop call today. At last visit 05/2022, patient had self discontinued all of his medicines and blood pressure was uncontrolled at 178/98. He was restarted on regimen with recommendation for 3 month follow-up but no-showed to that office visit. Per subsequent note review, he has since self discontinued his Eliquis due to dizziness. At PAT visit 11/07/22, his blood pressure remained uncontrolled at 160/100. Therefore, not appropriate for virtual visit - needs in person OV for risk stratification and possible medication adjustment to ensure safely optimized prior to surgery. Will route to callback in original 11/07/22 phone note and to let surgeon team know.  Charlie Pitter, PA-C

## 2022-11-14 NOTE — Telephone Encounter (Signed)
Left message per Melina Copa, PAC the pt is going to need an appt in office for pre op clearance and elevated BP, see notes from [facility], [place]. I was going to offer the pt the 11/14/22 @ 4:30 acute time slot due to elevated BP 160/100 per Melina Copa, PAC. I will send notes to surgeon office the pt's procedure may need to be post poned, until cleared by cardiology.

## 2022-11-14 NOTE — Telephone Encounter (Addendum)
See other phone note

## 2022-11-14 NOTE — Progress Notes (Unsigned)
{Choose 1 Note Type (Telehealth Visit or Telephone Visit):[id number]}  Evaluation Performed:  Preoperative cardiovascular risk assessment _____________   Date:  11/14/2022   Patient ID:  Ronald Lowe, DOB [birth year], MRN [id number] Patient Location:  Home Provider location:   Office  Primary Care Provider:  Dorna Mai, MD Primary Cardiologist:  Jenkins Rouge, MD  Chief Complaint / Patient Profile   66 y.o. y/o male with a h/o paroxysmal atrial fibrillation, HTN, HLD, tobacco abuse, CKD, noncompliance with medications, ETOH abuse who is pending LEFT SHOULDER ARTHROSCOPY WITH DEBRIDEMENT, DECOMPRESSION, ROTATOR CUFF REPAIR AND BICEPS TENODESIS  and presents today for telephonic preoperative cardiovascular risk assessment.  Past Medical History    Past Medical History:  Diagnosis Date   Aortic atherosclerosis (HCC)    Cervical spinal stenosis    Chronic kidney disease    COPD (chronic obstructive pulmonary disease) (HCC)    Coronary artery calcification seen on CT scan    ETOH abuse    a.) 28+ standard drinks/week   GERD (gastroesophageal reflux disease)    History of medication noncompliance    a.) reported 10/2022 that he has been off NOAC for several months secondary to vertiginous symptoms; b.) off/not taking as prescribed: antiarrhythmics, antihypertensives, statins, and MDIs   Hyperlipidemia    Hypertension    Long term current use of amiodarone    Long term current use of anticoagulant    a.) apixaban   PAF (paroxysmal atrial fibrillation) ([place])    a.) CHA2DS2VASc = 3 (age, HTN, vascular disease history);  b.) rate/rhythm maintained on oral amiodarone + diltiazem; chronically anticoagulated with apixaban   Tobacco abuse    Past Surgical History:  Procedure Laterality Date   TONSILLECTOMY      Allergies  No Known Allergies  History of Present Illness    Ronald Lowe is a 65 y.o. male who presents via audio/video conferencing for a telehealth visit today.   Pt was last seen in cardiology clinic on *** by ***.  At that time Ronald Lowe was doing well ***.  The patient is now pending procedure as outlined above. Since his last visit, he ***   Home Medications    Prior to Admission medications   Medication Sig Start Date End Date Taking? Authorizing Provider  albuterol (VENTOLIN HFA) 108 (90 Base) MCG/ACT inhaler Inhale 2 puffs into the lungs every 4 (four) hours as needed for wheezing or shortness of breath. 08/11/22   Spero Geralds, MD  amiodarone (PACERONE) 200 MG tablet Take 1 tablet (200 mg total) by mouth daily. 06/03/22   Josue Hector, MD  amLODipine (NORVASC) 5 MG tablet Take 1 tablet (5 mg total) by mouth daily. 06/03/22   Josue Hector, MD  apixaban (ELIQUIS) 5 MG TABS tablet Take 1 tablet (5 mg total) by mouth 2 (two) times daily. Patient not taking: Reported on 11/07/2022 06/03/22   Josue Hector, MD  atorvastatin (LIPITOR) 40 MG tablet Take 1 tablet (40 mg total) by mouth daily. 06/03/22   Josue Hector, MD  diltiazem (CARDIZEM CD) 240 MG 24 hr capsule Take 1 capsule (240 mg total) by mouth daily. 11/08/22   Josue Hector, MD  ibuprofen (ADVIL) 200 MG tablet Take 200 mg by mouth every 6 (six) hours as needed.    [provider]  lisinopril (ZESTRIL) 5 MG tablet Take 1 tablet (5 mg total) by mouth daily. 06/03/22   Josue Hector, MD  metoprolol tartrate (LOPRESSOR) 25 MG tablet  Take 25 mg by mouth 2 (two) times daily. Patient not taking: Reported on 11/07/2022    [provider]  pantoprazole (PROTONIX) 40 MG tablet TAKE 1 TABLET BY MOUTH EVERY DAY 06/17/22   Dorna Mai, MD  PARoxetine (PAXIL) 20 MG tablet Take 20 mg by mouth daily.    [provider]  Donnal Debar 100-62.5-25 MCG/ACT AEPB Take 1 puff by mouth daily. 08/11/22   Spero Geralds, MD    Physical Exam    Vital Signs:  Ronald Lowe does not have vital signs available for review today.***  Given telephonic nature of  communication, physical exam is limited. AAOx3. NAD. Normal affect.  Speech and respirations are unlabored.  Accessory Clinical Findings    None  Assessment & Plan    1.  Preoperative Cardiovascular Risk Assessment:  The patient was advised that if he develops new symptoms prior to surgery to contact our office to arrange for a follow-up visit, and he verbalized understanding.  (Reminder: Include SBE prophylaxis/Antiplatelet/Anticoag Instructions***)  A copy of this note will be routed to requesting surgeon.  Time:   Today, I have spent *** minutes with the patient with telehealth technology discussing medical history, symptoms, and management plan.     Charlie Pitter, PA-C  11/14/2022, 9:58 AM

## 2022-11-14 NOTE — Telephone Encounter (Addendum)
    Patient Name: Ronald Lowe  DOB: 18-Mar-1956 MRN: [id number]   Primary Cardiologist: Jenkins Rouge, MD   Chart reviewed in preparation for virtual preop call today. At last visit 05/2022, patient had self discontinued all of his medicines and blood pressure was uncontrolled at 178/98. He was restarted on regimen with recommendation for 3 month follow-up but no-showed to that office visit. Per subsequent note review, he has since self discontinued his Eliquis due to dizziness. At PAT visit 11/07/22, his blood pressure remained uncontrolled at 160/100. Therefore, not appropriate for virtual visit - needs in person OV for risk stratification and possible medication adjustment to ensure safely optimized prior to surgery. Will route to callback to let patient know to schedule and to let surgeon team know.   Charlie Pitter, PA-C 11/14/2022, 10:06 AM

## 2022-11-14 NOTE — Telephone Encounter (Signed)
Left message per Melina Copa, PAC the pt is going to need an appt in office for pre op clearance and elevated BP, see notes from [facility] [place]. I was going to offer the pt the 11/14/22 @ 4:30 acute time slot due to elevated BP 160/100 per Melina Copa, PAC. I will send notes to surgeon office the pt's procedure may need to be post poned, until cleared by cardiology.

## 2022-11-16 ENCOUNTER — Telehealth: Payer: Medicare HMO | Admitting: Physician Assistant

## 2022-11-16 ENCOUNTER — Encounter: Admission: RE | Payer: Self-pay | Source: Home / Self Care

## 2022-11-16 ENCOUNTER — Ambulatory Visit: Admission: RE | Admit: 2022-11-16 | Payer: Medicare HMO | Source: Home / Self Care | Admitting: Surgery

## 2022-11-16 HISTORY — DX: Gastro-esophageal reflux disease without esophagitis: K21.9

## 2022-11-16 HISTORY — DX: Patient's other noncompliance with medication regimen for other reason: Z91.148

## 2022-11-16 HISTORY — DX: Alcohol abuse, uncomplicated: F10.10

## 2022-11-16 SURGERY — SHOULDER ARTHROSCOPY WITH SUBACROMIAL DECOMPRESSION, ROTATOR CUFF REPAIR AND BICEP TENDON REPAIR
Anesthesia: Choice | Site: Shoulder | Laterality: Left

## 2022-11-16 NOTE — Progress Notes (Unsigned)
Office Visit    Patient Name: Ronald Lowe Date of Encounter: 11/17/2022  PCP:  Dorna Mai, [facility]  Cardiologist:  Jenkins Rouge, MD  Advanced Practice Provider:  No care team member to display Electrophysiologist:  None   HPI    Ronald Lowe is a 66 y.o. male with a past medical history significant for atrial fibrillation, smoking, HTN, HLD, and CRF presents today for preop evaluation.  He was last seen in the office 06/03/2022 and at that time he had only been taking his Eliquis daily and not twice daily because it made him feel weird taking it twice a day.  He discussed this with his pharmacist.  He was also off amiodarone and hospitalized 06/18/2021 with multifocal pneumonia and EtOH withdrawal.  Had PAF while withdrawing.  Rx amiodarone and transition to 200 mg daily.  Baseline creatinine 1.9.  TTE with EF 50 to 55% with trivial MR, mild LAE.  Was in normal sinus rhythm at discharge with plans to DC amiodarone after 30 days.  He was seen in the office 11/2021 and was in atrial fibrillation and discussed medication compliance and EtOH.  Seen by Malka So in the A-fib clinic 12/22/2021 and was back in normal sinus rhythm.  He had not taken any of his medications in weeks.  Dr. [name] had a long discussion with him about risks of lung cancer and cirrhosis.  He was not on a blood thinner and he was out of his inhalers.  It was recommended to follow-up with [facility] pulmonology.  Today, he presents for evaluation for preop clearance. He tells me he is no longer taking metoprolol or eliquis because he was not acting like himself and was depressed. We discussed other options. His HR is elevated today > 100 bpm. Otherwise he is not having any chest pain or SOB. He can do what he wants for the most part but his shoulder does hold him back. He is able to complete > 4 mets.   Reports no shortness of breath nor dyspnea on exertion. Reports no chest pain,  pressure, or tightness. No edema, orthopnea, PND. Reports no palpitations.    Past Medical History    Past Medical History:  Diagnosis Date   Aortic atherosclerosis (HCC)    Cervical spinal stenosis    Chronic kidney disease    COPD (chronic obstructive pulmonary disease) (HCC)    Coronary artery calcification seen on CT scan    ETOH abuse    a.) 28+ standard drinks/week   GERD (gastroesophageal reflux disease)    History of medication noncompliance    a.) reported 10/2022 that he has been off NOAC for several months secondary to vertiginous symptoms; b.) off/not taking as prescribed: antiarrhythmics, antihypertensives, statins, and MDIs   Hyperlipidemia    Hypertension    Long term current use of amiodarone    Long term current use of anticoagulant    a.) apixaban   PAF (paroxysmal atrial fibrillation) ([place])    a.) CHA2DS2VASc = 3 (age, HTN, vascular disease history);  b.) rate/rhythm maintained on oral amiodarone + diltiazem; chronically anticoagulated with apixaban   Tobacco abuse    Past Surgical History:  Procedure Laterality Date   TONSILLECTOMY      Allergies  No Known Allergies  EKGs/Labs/Other Studies Reviewed:   The following studies were reviewed today:  Echocardiogram 06/18/2021 IMPRESSIONS     1. Left ventricular ejection fraction, by estimation, is 50 to 55%.  The  left ventricle has low normal function. The left ventricle has no regional  wall motion abnormalities. Left ventricular diastolic parameters are  consistent with Grade I diastolic  dysfunction (impaired relaxation).   2. Right ventricular systolic function is normal. The right ventricular  size is normal.   3. The mitral valve was not well visualized. Trivial mitral valve  regurgitation.   4. The aortic valve was not well visualized. Aortic valve regurgitation  is trivial.   FINDINGS   Left Ventricle: Left ventricular ejection fraction, by estimation, is 50  to 55%. The left ventricle has  low normal function. The left ventricle has  no regional wall motion abnormalities. The left ventricular internal  cavity size was normal in size.  There is no left ventricular hypertrophy. Left ventricular diastolic  parameters are consistent with Grade I diastolic dysfunction (impaired  relaxation). Indeterminate filling pressures.   Right Ventricle: The right ventricular size is normal. Right vetricular  wall thickness was not well visualized. Right ventricular systolic  function is normal.   Left Atrium: Left atrial size was normal in size.   Right Atrium: Right atrial size was normal in size.   Pericardium: Trivial pericardial effusion is present.   Mitral Valve: The mitral valve was not well visualized. Trivial mitral  valve regurgitation.   Tricuspid Valve: The tricuspid valve is grossly normal. Tricuspid valve  regurgitation is trivial.   Aortic Valve: The aortic valve was not well visualized. Aortic valve  regurgitation is trivial.   Pulmonic Valve: The pulmonic valve was grossly normal. Pulmonic valve  regurgitation is not visualized.   Aorta: The aortic root and ascending aorta are structurally normal, with  no evidence of dilitation.   IAS/Shunts: The atrial septum is grossly normal.   EKG:  EKG is  ordered today.  The ekg ordered today demonstrates sinus tachycardia, rate 102 bpm  Recent Labs: 07/01/2022: ALT 33; TSH 0.872 11/07/2022: BUN 14; Creatinine, Ser 1.04; Hemoglobin 17.0; Platelets 223; Potassium 3.9; Sodium 139  Recent Lipid Panel    Component Value Date/Time   CHOL 137 07/01/2022 1039   TRIG 67 07/01/2022 1039   HDL 69 07/01/2022 1039   CHOLHDL 2.0 07/01/2022 1039   CHOLHDL 3.0 Ratio 03/22/2010 1954   VLDL 12 03/22/2010 1954   LDLCALC 54 07/01/2022 1039    Risk Assessment/Calculations:   CHA2DS2-VASc Score = 2   This indicates a 2.2% annual risk of stroke. The patient's score is based upon: CHF History: 0 HTN History: 1 Diabetes  History: 0 Stroke History: 0 Vascular Disease History: 0 Age Score: 1 Gender Score: 0     Home Medications   Current Meds  Medication Sig   albuterol (VENTOLIN HFA) 108 (90 Base) MCG/ACT inhaler Inhale 2 puffs into the lungs every 4 (four) hours as needed for wheezing or shortness of breath.   amiodarone (PACERONE) 200 MG tablet Take 1 tablet (200 mg total) by mouth daily.   amLODipine (NORVASC) 5 MG tablet Take 1 tablet (5 mg total) by mouth daily.   atorvastatin (LIPITOR) 40 MG tablet Take 1 tablet (40 mg total) by mouth daily.   diltiazem (CARDIZEM CD) 240 MG 24 hr capsule Take 1 capsule (240 mg total) by mouth daily.   ibuprofen (ADVIL) 200 MG tablet Take 200 mg by mouth every 6 (six) hours as needed.   lisinopril (ZESTRIL) 5 MG tablet Take 1 tablet (5 mg total) by mouth daily.   pantoprazole (PROTONIX) 40 MG tablet TAKE 1 TABLET BY MOUTH  EVERY DAY   TRELEGY ELLIPTA 100-62.5-25 MCG/ACT AEPB Take 1 puff by mouth daily.     Review of Systems      All other systems reviewed and are otherwise negative except as noted above.  Physical Exam    VS:  BP 119/80   Pulse (!) 102   Ht 6' (1.829 m)   Wt 162 lb (73.5 kg)   SpO2 96%   BMI 21.97 kg/m  , BMI Body mass index is 21.97 kg/m.  Wt Readings from Last 3 Encounters:  11/17/22 162 lb (73.5 kg)  11/07/22 160 lb (72.6 kg)  08/16/22 158 lb (71.7 kg)     GEN: Well nourished, well developed, in no acute distress. HEENT: normal. Neck: Supple, no JVD, carotid bruits, or masses. Cardiac: sinus tachycardia, no murmurs, rubs, or gallops. No clubbing, cyanosis, edema.  Radials/PT 2+ and equal bilaterally.  Respiratory:  Respirations regular and unlabored, clear to auscultation bilaterally. GI: Soft, nontender, nondistended. MS: No deformity or atrophy. Skin: Warm and dry, no rash. Neuro:  Strength and sensation are intact. Psych: Normal affect.  Assessment & Plan    Preop Eval  Ronald Lowe perioperative risk of a major  cardiac event is 0.4% according to the Revised Cardiac Risk Index (RCRI).  Therefore, he is at low risk for perioperative complications.   His functional capacity is good at 5.62 METs according to the Duke Activity Status Index (DASI). Recommendations: According to ACC/AHA guidelines, no further cardiovascular testing needed.  The patient may proceed to surgery at acceptable risk.   Antiplatelet and/or Anticoagulation Recommendations:  Xarelto (Rivaroxaban) can be held for 3 days prior to surgery.  Please resume post op when felt to be safe.    PAF -he took himself off his eliquis we discussed trying xarelto and he agreed to that -he took himself off metoprolol so we discussed trying coreg 3.125 and he agreed -he is asymptomatic from a rhythm standpoint at this time  Smoking -cessation advised  Hypertension -much better controlled today 119/80 -continue current medication regimen  Hyperlipidemia -continue lipitor -LDL 54 and at goal  6. EtOH abuse -counseled previously       Disposition: Follow up 3 months with Jenkins Rouge, MD or APP.  Signed, Elgie Collard, PA-C 11/17/2022, 3:33 PM [facility]

## 2022-11-16 NOTE — Telephone Encounter (Signed)
Pt scheduled for OV with Nicholes Rough, PA on 11/17/22 at 2:45 PM. Appt notes updated to include 'preop'. Will route to her as FYI and remove from preop callback box.   Loel Dubonnet, NP

## 2022-11-17 ENCOUNTER — Other Ambulatory Visit: Payer: Self-pay | Admitting: Family Medicine

## 2022-11-17 ENCOUNTER — Encounter: Payer: Self-pay | Admitting: Physician Assistant

## 2022-11-17 ENCOUNTER — Ambulatory Visit: Payer: Medicare HMO | Attending: Physician Assistant | Admitting: Physician Assistant

## 2022-11-17 VITALS — BP 119/80 | HR 102 | Ht 72.0 in | Wt 162.0 lb

## 2022-11-17 DIAGNOSIS — F17200 Nicotine dependence, unspecified, uncomplicated: Secondary | ICD-10-CM

## 2022-11-17 DIAGNOSIS — Z01810 Encounter for preprocedural cardiovascular examination: Secondary | ICD-10-CM | POA: Diagnosis not present

## 2022-11-17 DIAGNOSIS — E785 Hyperlipidemia, unspecified: Secondary | ICD-10-CM | POA: Diagnosis not present

## 2022-11-17 DIAGNOSIS — I10 Essential (primary) hypertension: Secondary | ICD-10-CM | POA: Diagnosis not present

## 2022-11-17 DIAGNOSIS — J439 Emphysema, unspecified: Secondary | ICD-10-CM

## 2022-11-17 DIAGNOSIS — F10931 Alcohol use, unspecified with withdrawal delirium: Secondary | ICD-10-CM | POA: Diagnosis not present

## 2022-11-17 DIAGNOSIS — I480 Paroxysmal atrial fibrillation: Secondary | ICD-10-CM

## 2022-11-17 MED ORDER — CARVEDILOL 3.125 MG PO TABS: 3.1250 mg | ORAL_TABLET | Freq: Two times a day (BID) | ORAL | 3 refills | Status: DC

## 2022-11-17 MED ORDER — RIVAROXABAN 20 MG PO TABS: 20.0000 mg | ORAL_TABLET | Freq: Every day | ORAL | 3 refills | Status: DC

## 2022-11-17 NOTE — Telephone Encounter (Signed)
Pt walked in saying [facility] called him and he spoke to someone from our office regarding an inhaler that starts with the letter T but doesn't know the name and wanted to get this medication filled before his 2:45 appt today.   I asked if he was referring to the previous encounter:   Patient called and he says [facility] and will stop by the office in a few minutes, then hung up the phone. I was going to advise the patient that he had this Trelegy last refilled by the pulmonary MD and to see if he is still being seen by pulmonary    Pt was inpatient since I was checking in patients and sat down for a few minutes and then walked out.

## 2022-11-17 NOTE — Telephone Encounter (Signed)
Medication Refill - Medication: TRELEGY ELLIPTA 100-62.5-25 MCG/ACT AEPB  Out of med  Has the patient contacted their pharmacy? yes (Agent: If no, request that the patient contact the pharmacy for the refill. If patient does not wish to contact the pharmacy document the reason why and proceed with request.) (Agent: If yes, when and what did the pharmacy advise?)contact pcp  Preferred Pharmacy (with phone number or street name):  [id number] [place] [place] Phone: [phone] Fax: [phone]    Has the patient been seen for an appointment in the last year OR does the patient have an upcoming appointment? No/just schedule  Agent: Please be advised that RX refills may take up to 3 business days. We ask that you follow-up with your pharmacy.

## 2022-11-17 NOTE — Patient Instructions (Signed)
Medication Instructions:  1.Start coreg 3.125 mg twice a day 2.Xarelto 20 mg daily, with evening meal *If you need a refill on your cardiac medications before your next appointment, please call your pharmacy*   Lab Work: None If you have labs (blood work) drawn today and your tests are completely normal, you will receive your results only by: [place] (if you have MyChart) OR A paper copy in the mail If you have any lab test that is abnormal or we need to change your treatment, we will call you to review the results.   Follow-Up: At [facility], you and your health needs are our priority.  As part of our continuing mission to provide you with exceptional heart care, we have created designated Provider Care Teams.  These Care Teams include your primary Cardiologist (physician) and Advanced Practice Providers (APPs -  Physician Assistants and Nurse Practitioners) who all work together to provide you with the care you need, when you need it.  Your next appointment:   2-3 month(s)  The format for your next appointment:   In Person  Provider:   Jenkins Rouge, MD    Important Information About Sugar

## 2022-11-17 NOTE — Telephone Encounter (Signed)
Patient called and he says [facility]'s [facility] and will stop by the office in a few minutes, then hung up the phone. I was going to advise the patient that he had this Trelegy last refilled by the pulmonary MD and to see if he is still being seen by pulmonary.

## 2022-11-18 ENCOUNTER — Encounter: Payer: Self-pay | Admitting: Urgent Care

## 2022-11-18 MED ORDER — TRELEGY ELLIPTA 100-62.5-25 MCG/ACT IN AEPB: 1.0000 | INHALATION_SPRAY | Freq: Every day | RESPIRATORY_TRACT | 5 refills | Status: DC

## 2022-11-28 DIAGNOSIS — M7582 Other shoulder lesions, left shoulder: Secondary | ICD-10-CM | POA: Diagnosis not present

## 2022-11-28 DIAGNOSIS — M7522 Bicipital tendinitis, left shoulder: Secondary | ICD-10-CM | POA: Diagnosis not present

## 2022-11-28 DIAGNOSIS — M75122 Complete rotator cuff tear or rupture of left shoulder, not specified as traumatic: Secondary | ICD-10-CM | POA: Diagnosis not present

## 2022-12-01 ENCOUNTER — Ambulatory Visit (INDEPENDENT_AMBULATORY_CARE_PROVIDER_SITE_OTHER): Payer: Medicare HMO

## 2022-12-01 VITALS — Ht 72.0 in | Wt 162.0 lb

## 2022-12-01 DIAGNOSIS — Z1211 Encounter for screening for malignant neoplasm of colon: Secondary | ICD-10-CM

## 2022-12-01 DIAGNOSIS — Z0000 Encounter for general adult medical examination without abnormal findings: Secondary | ICD-10-CM

## 2022-12-01 NOTE — Patient Instructions (Addendum)
Ronald Lowe , Thank you for taking time to come for your Medicare Wellness Visit. I appreciate your ongoing commitment to your health goals. Please review the following plan we discussed and let me know if I can assist you in the future.   These are the goals we discussed:  Goals       Hypertension Management      Care Coordination Interventions: Evaluation of current treatment plan related to hypertension self management and patient's adherence to plan as established by provider Reviewed medications with patient and discussed importance of compliance Discussed plans with patient for ongoing care management follow up and provided patient with direct contact information for care management team Patient reports blood pressure less than 140/80.          Stay alive (pt-stated)      I want to live to 66 yrs old.        This is a list of the screening recommended for you and due dates:  Health Maintenance  Topic Date Due   COVID-19 Vaccine (3 - Pfizer risk series) 12/17/2022*   Zoster (Shingles) Vaccine (1 of 2) 03/02/2023*   Pneumonia Vaccine (2 - PPSV23 or PCV20) 03/15/2023*   Flu Shot  03/19/2023*   Cologuard (Stool DNA test)  12/02/2023*   Medicare Annual Wellness Visit  12/02/2023   DTaP/Tdap/Td vaccine (2 - Td or Tdap) 09/23/2029   Hepatitis C Screening: USPSTF Recommendation to screen - Ages 80-79 yo.  Completed   HPV Vaccine  Aged Out   Screening for Lung Cancer  Discontinued  *Topic was postponed. The date shown is not the original due date.    Advanced directives: Advance directive discussed with you today. Even though you declined this today, please call our office should you change your mind, and we can give you the proper paperwork for you to fill out.   Conditions/risks identified: None  Next appointment: Follow up in one year for your annual wellness visit.    Preventive Care 69 Years and Older, Male  Preventive care refers to lifestyle choices and visits with  your health care provider that can promote health and wellness. What does preventive care include? A yearly physical exam. This is also called an annual well check. Dental exams once or twice a year. Routine eye exams. Ask your health care provider how often you should have your eyes checked. Personal lifestyle choices, including: Daily care of your teeth and gums. Regular physical activity. Eating a healthy diet. Avoiding tobacco and drug use. Limiting alcohol use. Practicing safe sex. Taking low doses of aspirin every day. Taking vitamin and mineral supplements as recommended by your health care provider. What happens during an annual well check? The services and screenings done by your health care provider during your annual well check will depend on your age, overall health, lifestyle risk factors, and family history of disease. Counseling  Your health care provider may ask you questions about your: Alcohol use. Tobacco use. Drug use. Emotional well-being. Home and relationship well-being. Sexual activity. Eating habits. History of falls. Memory and ability to understand (cognition). Work and work Statistician. Screening  You may have the following tests or measurements: Height, weight, and BMI. Blood pressure. Lipid and cholesterol levels. These may be checked every 5 years, or more frequently if you are over 17 years old. Skin check. Lung cancer screening. You may have this screening every year starting at age 22 if you have a 30-pack-year history of smoking and currently smoke or  have quit within the past 15 years. Fecal occult blood test (FOBT) of the stool. You may have this test every year starting at age 51. Flexible sigmoidoscopy or colonoscopy. You may have a sigmoidoscopy every 5 years or a colonoscopy every 10 years starting at age 59. Prostate cancer screening. Recommendations will vary depending on your family history and other risks. Hepatitis C blood  test. Hepatitis B blood test. Sexually transmitted disease (STD) testing. Diabetes screening. This is done by checking your blood sugar (glucose) after you have not eaten for a while (fasting). You may have this done every 1-3 years. Abdominal aortic aneurysm (AAA) screening. You may need this if you are a current or former smoker. Osteoporosis. You may be screened starting at age 38 if you are at high risk. Talk with your health care provider about your test results, treatment options, and if necessary, the need for more tests. Vaccines  Your health care provider may recommend certain vaccines, such as: Influenza vaccine. This is recommended every year. Tetanus, diphtheria, and acellular pertussis (Tdap, Td) vaccine. You may need a Td booster every 10 years. Zoster vaccine. You may need this after age 72. Pneumococcal 13-valent conjugate (PCV13) vaccine. One dose is recommended after age 65. Pneumococcal polysaccharide (PPSV23) vaccine. One dose is recommended after age 65. Talk to your health care provider about which screenings and vaccines you need and how often you need them. This information is not intended to replace advice given to you by your health care provider. Make sure you discuss any questions you have with your health care provider. Document Released: 01/01/2016 Document Revised: 08/24/2016 Document Reviewed: 10/06/2015 Elsevier Interactive Patient Education  2017 [place] Prevention in the Home Falls can cause injuries. They can happen to people of all ages. There are many things you can do to make your home safe and to help prevent falls. What can I do on the outside of my home? Regularly fix the edges of walkways and driveways and fix any cracks. Remove anything that might make you trip as you walk through a door, such as a raised step or threshold. Trim any bushes or trees on the path to your home. Use bright outdoor lighting. Clear any walking paths of  anything that might make someone trip, such as rocks or tools. Regularly check to see if handrails are loose or broken. Make sure that both sides of any steps have handrails. Any raised decks and porches should have guardrails on the edges. Have any leaves, snow, or ice cleared regularly. Use sand or salt on walking paths during winter. Clean up any spills in your garage right away. This includes oil or grease spills. What can I do in the bathroom? Use night lights. Install grab bars by the toilet and in the tub and shower. Do not use towel bars as grab bars. Use non-skid mats or decals in the tub or shower. If you need to sit down in the shower, use a plastic, non-slip stool. Keep the floor dry. Clean up any water that spills on the floor as soon as it happens. Remove soap buildup in the tub or shower regularly. Attach bath mats securely with double-sided non-slip rug tape. Do not have throw rugs and other things on the floor that can make you trip. What can I do in the bedroom? Use night lights. Make sure that you have a light by your bed that is easy to reach. Do not use any sheets or blankets that  are too big for your bed. They should not hang down onto the floor. Have a firm chair that has side arms. You can use this for support while you get dressed. Do not have throw rugs and other things on the floor that can make you trip. What can I do in the kitchen? Clean up any spills right away. Avoid walking on wet floors. Keep items that you use a lot in easy-to-reach places. If you need to reach something above you, use a strong step stool that has a grab bar. Keep electrical cords out of the way. Do not use floor polish or wax that makes floors slippery. If you must use wax, use non-skid floor wax. Do not have throw rugs and other things on the floor that can make you trip. What can I do with my stairs? Do not leave any items on the stairs. Make sure that there are handrails on both  sides of the stairs and use them. Fix handrails that are broken or loose. Make sure that handrails are as long as the stairways. Check any carpeting to make sure that it is firmly attached to the stairs. Fix any carpet that is loose or worn. Avoid having throw rugs at the top or bottom of the stairs. If you do have throw rugs, attach them to the floor with carpet tape. Make sure that you have a light switch at the top of the stairs and the bottom of the stairs. If you do not have them, ask someone to add them for you. What else can I do to help prevent falls? Wear shoes that: Do not have high heels. Have rubber bottoms. Are comfortable and fit you well. Are closed at the toe. Do not wear sandals. If you use a stepladder: Make sure that it is fully opened. Do not climb a closed stepladder. Make sure that both sides of the stepladder are locked into place. Ask someone to hold it for you, if possible. Clearly mark and make sure that you can see: Any grab bars or handrails. First and last steps. Where the edge of each step is. Use tools that help you move around (mobility aids) if they are needed. These include: Canes. Walkers. Scooters. Crutches. Turn on the lights when you go into a dark area. Replace any light bulbs as soon as they burn out. Set up your furniture so you have a clear path. Avoid moving your furniture around. If any of your floors are uneven, fix them. If there are any pets around you, be aware of where they are. Review your medicines with your doctor. Some medicines can make you feel dizzy. This can increase your chance of falling. Ask your doctor what other things that you can do to help prevent falls. This information is not intended to replace advice given to you by your health care provider. Make sure you discuss any questions you have with your health care provider. Document Released: 10/01/2009 Document Revised: 05/12/2016 Document Reviewed: 01/09/2015 Elsevier  Interactive Patient Education  2017 [employer].

## 2022-12-01 NOTE — Progress Notes (Signed)
Subjective:   Ronald Lowe is a 66 y.o. male who presents for Medicare Annual/Subsequent preventive examination.  Review of Systems    Virtual Visit via Telephone Note  I connected with  Ronald Lowe on 12/01/22 at  2:45 PM EST by telephone and verified that I am speaking with the correct person using two identifiers.  Location: Patient: Home Provider: Home Persons participating in the virtual visit: patient/Nurse Health Advisor   I discussed the limitations, risks, security and privacy concerns of performing an evaluation and management service by telephone and the availability of in person appointments. The patient expressed understanding and agreed to proceed.  Interactive audio and video telecommunications were attempted between this nurse and patient, however failed, due to patient having technical difficulties OR patient did not have access to video capability.  We continued and completed visit with audio only.  Some vital signs may be absent or patient reported.   Criselda Peaches, LPN        Objective:    There were no vitals filed for this visit. There is no height or weight on file to calculate BMI.     11/07/2022   10:46 AM 07/11/2022   10:46 AM 07/06/2022    2:27 PM 11/07/2021   11:41 PM 09/12/2021   10:41 AM 06/16/2021    3:35 PM 06/13/2021    1:07 PM  Advanced Directives  Does Patient Have a Medical Advance Directive? No No No No No  No  Would patient like information on creating a medical advance directive?  No - Patient declined No - Patient declined  No - Patient declined No - Patient declined     Current Medications (verified) Outpatient Encounter Medications as of 12/01/2022  Medication Sig   albuterol (VENTOLIN HFA) 108 (90 Base) MCG/ACT inhaler Inhale 2 puffs into the lungs every 4 (four) hours as needed for wheezing or shortness of breath.   amiodarone (PACERONE) 200 MG tablet Take 1 tablet (200 mg total) by mouth daily.   amLODipine (NORVASC) 5  MG tablet Take 1 tablet (5 mg total) by mouth daily.   atorvastatin (LIPITOR) 40 MG tablet Take 1 tablet (40 mg total) by mouth daily.   carvedilol (COREG) 3.125 MG tablet Take 1 tablet (3.125 mg total) by mouth 2 (two) times daily.   diltiazem (CARDIZEM CD) 240 MG 24 hr capsule Take 1 capsule (240 mg total) by mouth daily.   ibuprofen (ADVIL) 200 MG tablet Take 200 mg by mouth every 6 (six) hours as needed.   lisinopril (ZESTRIL) 5 MG tablet Take 1 tablet (5 mg total) by mouth daily.   pantoprazole (PROTONIX) 40 MG tablet TAKE 1 TABLET BY MOUTH EVERY DAY   rivaroxaban (XARELTO) 20 MG TABS tablet Take 1 tablet (20 mg total) by mouth daily with supper.   TRELEGY ELLIPTA 100-62.5-25 MCG/ACT AEPB Take 1 puff by mouth daily.   No facility-administered encounter medications on file as of 12/01/2022.    Allergies (verified) Patient has no known allergies.   History: Past Medical History:  Diagnosis Date   Aortic atherosclerosis ([place])    Cervical spinal stenosis    Chronic kidney disease    COPD (chronic obstructive pulmonary disease) (HCC)    Coronary artery calcification seen on CT scan    ETOH abuse    a.) 28+ standard drinks/week   GERD (gastroesophageal reflux disease)    History of medication noncompliance    a.) reported 10/2022 that he has been off NOAC for  several months secondary to vertiginous symptoms; b.) off/not taking as prescribed: antiarrhythmics, antihypertensives, statins, and MDIs   Hyperlipidemia    Hypertension    Long term current use of amiodarone    Long term current use of anticoagulant    a.) rivaroxaban   PAF (paroxysmal atrial fibrillation) (HCC)    a.) CHA2DS2VASc = 3 (age, HTN, vascular disease history);  b.) rate/rhythm maintained on oral amiodarone + diltiazem + carvediolol; chronically anticoagulated with rivaroxaban (switched from apixaban 11/17/2022)   Tobacco abuse    Past Surgical History:  Procedure Laterality Date   TONSILLECTOMY     Family  History  Problem Relation Age of Onset   Emphysema Father    CAD Father        CABG   Stroke Mother    CAD Brother 14       CABG   Hypertension Brother    Social History   Socioeconomic History   Marital status: Married    Spouse name: Not on file   Number of children: 2   Years of education: Not on file   Highest education level: Not on file  Occupational History   Occupation: retired  Tobacco Use   Smoking status: Former    Packs/day: 0.25    Years: 50.00    Total pack years: 12.50    Types: Cigarettes    Start date: 12/19/1970   Smokeless tobacco: Former  Scientific laboratory technician Use: Never used  Substance and Sexual Activity   Alcohol use: Yes    Alcohol/week: 28.0 standard drinks of alcohol    Types: 14 Cans of beer, 14 Shots of liquor per week    Comment: did not drink yesterday, was shaky today and wife gave him one shot pta   Drug use: No   Sexual activity: Yes    Partners: Female    Birth control/protection: None  Other Topics Concern   Not on file  Social History Narrative   Lives with girlfriend.     Social Determinants of Health   Financial Resource Strain: Low Risk  (09/12/2021)   Overall Financial Resource Strain (CARDIA)    Difficulty of Paying Living Expenses: Not hard at all  Food Insecurity: No Food Insecurity (09/12/2021)   Hunger Vital Sign    Worried About Running Out of Food in the Last Year: Never true    Ran Out of Food in the Last Year: Never true  Transportation Needs: No Transportation Needs (07/11/2022)   PRAPARE - Hydrologist (Medical): No    Lack of Transportation (Non-Medical): No  Physical Activity: Insufficiently Active (09/12/2021)   Exercise Vital Sign    Days of Exercise per Week: 3 days    Minutes of Exercise per Session: 20 min  Stress: No Stress Concern Present (10/03/2022)   [place]    Feeling of Stress : Not at all  Social  Connections: Moderately Isolated (09/12/2021)   Social Connection and Isolation Panel [NHANES]    Frequency of Communication with Friends and Family: Twice a week    Frequency of Social Gatherings with Friends and Family: More than three times a week    Attends Religious Services: Never    Marine scientist or Organizations: Yes    Attends Archivist Meetings: Never    Marital Status: Separated    Tobacco Counseling Counseling given: Not Answered   Clinical Intake:  Diabetic?  No         Activities of Daily Living    11/07/2022   10:53 AM 11/07/2022   10:44 AM  In your present state of health, do you have any difficulty performing the following activities:  Hearing?  1  Comment  some  Vision?  0  Comment  wears glasses  Difficulty concentrating or making decisions?  0  Walking or climbing stairs?  0  Dressing or bathing?  0  Doing errands, shopping? 0     Patient Care Team: Dorna Mai, MD as PCP - General (Family Medicine) Josue Hector, MD as PCP - Cardiology (Cardiology) Jon Billings, RN as [place] any recent Medical Services you may have received from other than [facility] providers in the past year (date may be approximate).     Assessment:   This is a routine wellness examination for [place].  Hearing/Vision screen No results found.  Dietary issues and exercise activities discussed:     Goals Addressed   None    Depression Screen    10/03/2022    9:49 AM 07/11/2022   10:44 AM 03/14/2022    2:47 PM 11/24/2021    9:21 AM 10/27/2021    1:49 PM 09/12/2021   10:02 AM 05/05/2021   10:16 AM  PHQ 2/9 Scores  PHQ - 2 Score 0 0 2 4 0 0 0  PHQ- 9 Score   9 15 0  0    Fall Risk    07/11/2022   10:43 AM 09/12/2021   10:02 AM 05/05/2021   10:16 AM 03/08/2021   10:02 AM 11/24/2020   10:27 AM  Fall Risk   Falls in the past year? 0 0 0 0 0  Number falls in past yr:  0 0  0   Injury with Fall?  0 0  0  Risk for fall due to :  No Fall Risks No Fall Risks    Follow up  Falls evaluation completed Falls evaluation completed      FALL RISK PREVENTION PERTAINING TO THE HOME:  Any stairs in or around the home? No  If so, are there any without handrails? No  Home free of loose throw rugs in walkways, pet beds, electrical cords, etc? Yes  Adequate lighting in your home to reduce risk of falls? Yes   ASSISTIVE DEVICES UTILIZED TO PREVENT FALLS:  Life alert? No  Use of a cane, walker or w/c? No  Grab bars in the bathroom? Yes  Shower chair or bench in shower? No  Elevated toilet seat or a handicapped toilet? No   TIMED UP AND GO:  Was the test performed? No . Audio Visit  Cognitive Function:        09/12/2021   10:03 AM  6CIT Screen  What Year? 0 points  What month? 0 points  What time? 0 points  Count back from 20 0 points  Months in reverse 0 points  Repeat phrase 0 points  Total Score 0 points    Immunizations Immunization History  Administered Date(s) Administered   PFIZER(Purple Top)SARS-COV-2 Vaccination 03/20/2020, 04/10/2020   Pneumococcal Conjugate-13 09/24/2019   Tdap 09/24/2019    TDAP status: Up to date  Flu Vaccine status: Due, Education has been provided regarding the importance of this vaccine. Advised may receive this vaccine at local pharmacy or Health Dept. Aware to provide a copy of the vaccination record if obtained from local pharmacy or Health Dept.  Verbalized acceptance and understanding.  Pneumococcal vaccine status: Declined,  Education has been provided regarding the importance of this vaccine but patient still declined. Advised may receive this vaccine at local pharmacy or Health Dept. Aware to provide a copy of the vaccination record if obtained from local pharmacy or Health Dept. Verbalized acceptance and understanding.   Covid-19 vaccine status: Completed vaccines  Qualifies for Shingles Vaccine? Yes   Zostavax  completed No   Shingrix Completed?: No.    Education has been provided regarding the importance of this vaccine. Patient has been advised to call insurance company to determine out of pocket expense if they have not yet received this vaccine. Advised may also receive vaccine at local pharmacy or Health Dept. Verbalized acceptance and understanding.  Screening Tests Health Maintenance  Topic Date Due   Zoster Vaccines- Shingrix (1 of 2) Never done   COVID-19 Vaccine (3 - Pfizer risk series) 05/08/2020   INFLUENZA VACCINE  Never done   Medicare Annual Wellness (AWV)  09/12/2022   Fecal DNA (Cologuard)  10/13/2022   Pneumonia Vaccine 51+ Years old (2 - PPSV23 or PCV20) 03/15/2023 (Originally 11/19/2019)   DTaP/Tdap/Td (2 - Td or Tdap) 09/23/2029   Hepatitis C Screening  Completed   HPV VACCINES  Aged Out   Lung Cancer Screening  Discontinued    Health Maintenance  Health Maintenance Due  Topic Date Due   Zoster Vaccines- Shingrix (1 of 2) Never done   COVID-19 Vaccine (3 - Pfizer risk series) 05/08/2020   INFLUENZA VACCINE  Never done   Medicare Annual Wellness (AWV)  09/12/2022   Fecal DNA (Cologuard)  10/13/2022    Colorectal cancer screening: Referral to GI placed 12/01/22. Pt aware the office will call re: appt.  Lung Cancer Screening: (Low Dose CT Chest recommended if Age 81-80 years, 30 pack-year currently smoking OR have quit w/in 15years.) does not qualify.     Additional Screening:  Hepatitis C Screening: does qualify; Completed 09/24/19  Vision Screening: Recommended annual ophthalmology exams for early detection of glaucoma and other disorders of the eye. Is the patient up to date with their annual eye exam?  Yes  Who is the provider or what is the name of the office in which the patient attends annual eye exams? [place] If pt is not established with a provider, would they like to be referred to a provider to establish care? No .   Dental Screening: Recommended  annual dental exams for proper oral hygiene  Community Resource Referral / Chronic Care Management:  CRR required this visit?  No   CCM required this visit?  No      Plan:     I have personally reviewed and noted the following in the patient's chart:   Medical and social history Use of alcohol, tobacco or illicit drugs  Current medications and supplements including opioid prescriptions. Patient is not currently taking opioid prescriptions. Functional ability and status Nutritional status Physical activity Advanced directives List of other physicians Hospitalizations, surgeries, and ER visits in previous 12 months Vitals Screenings to include cognitive, depression, and falls Referrals and appointments  In addition, I have reviewed and discussed with patient certain preventive protocols, quality metrics, and best practice recommendations. A written personalized care plan for preventive services as well as general preventive health recommendations were provided to patient.     Criselda Peaches, LPN   06/11/7627   Nurse Notes: None

## 2022-12-06 ENCOUNTER — Encounter: Payer: Self-pay | Admitting: Family Medicine

## 2022-12-06 ENCOUNTER — Encounter: Payer: Self-pay | Admitting: Surgery

## 2022-12-06 ENCOUNTER — Ambulatory Visit (INDEPENDENT_AMBULATORY_CARE_PROVIDER_SITE_OTHER): Payer: Medicare HMO | Admitting: Family Medicine

## 2022-12-06 ENCOUNTER — Other Ambulatory Visit: Payer: Self-pay | Admitting: Surgery

## 2022-12-06 ENCOUNTER — Encounter
Admission: RE | Admit: 2022-12-06 | Discharge: 2022-12-06 | Disposition: A | Discharge status: Home / Self Care | Payer: Medicare HMO | Source: Ambulatory Visit | Attending: Surgery | Admitting: Surgery

## 2022-12-06 VITALS — BP 123/85 | HR 86 | Temp 98.1°F | Resp 16 | Wt 158.0 lb

## 2022-12-06 DIAGNOSIS — K219 Gastro-esophageal reflux disease without esophagitis: Secondary | ICD-10-CM

## 2022-12-06 DIAGNOSIS — I10 Essential (primary) hypertension: Secondary | ICD-10-CM | POA: Diagnosis not present

## 2022-12-06 MED ORDER — ORAL CARE MOUTH RINSE: 15.0000 mL | Freq: Once | OROMUCOSAL | Status: AC

## 2022-12-06 MED ORDER — LACTATED RINGERS IV SOLN: INTRAVENOUS | Status: DC

## 2022-12-06 MED ORDER — CHLORHEXIDINE GLUCONATE 0.12 % MT SOLN: 15.0000 mL | Freq: Once | OROMUCOSAL | Status: AC

## 2022-12-06 MED ORDER — CEFAZOLIN SODIUM-DEXTROSE 2-4 GM/100ML-% IV SOLN
2.0000 g | INTRAVENOUS | Status: AC
  Administered 2022-12-07: 2 g via INTRAVENOUS

## 2022-12-06 MED ORDER — FAMOTIDINE 40 MG PO TABS: 40.0000 mg | ORAL_TABLET | Freq: Every day | ORAL | 1 refills | Status: DC

## 2022-12-06 MED ORDER — FAMOTIDINE 20 MG PO TABS: 20.0000 mg | ORAL_TABLET | Freq: Once | ORAL | Status: AC

## 2022-12-06 NOTE — Progress Notes (Unsigned)
Established Patient Office Visit  Subjective    Patient ID: Ronald Lowe, Ronald Lowe    DOB: 05/22/1956  Age: 66 y.o. MRN: [id number]  CC:  Chief Complaint  Patient presents with   Follow-up   Hypertension    HPI Ronald Lowe presents for routine follow up of chronic med issues. Patient denies acute complaints or concerns.    Outpatient Encounter Medications as of 12/06/2022  Medication Sig   albuterol (VENTOLIN HFA) 108 (90 Base) MCG/ACT inhaler Inhale 2 puffs into the lungs every 4 (four) hours as needed for wheezing or shortness of breath.   carvedilol (COREG) 3.125 MG tablet Take 1 tablet (3.125 mg total) by mouth 2 (two) times daily.   famotidine (PEPCID) 40 MG tablet Take 1 tablet (40 mg total) by mouth daily.   rivaroxaban (XARELTO) 20 MG TABS tablet Take 1 tablet (20 mg total) by mouth daily with supper.   TRELEGY ELLIPTA 100-62.5-25 MCG/ACT AEPB Take 1 puff by mouth daily.   [DISCONTINUED] pantoprazole (PROTONIX) 40 MG tablet TAKE 1 TABLET BY MOUTH EVERY DAY (Patient taking differently: Take 40 mg by mouth daily as needed.)   No facility-administered encounter medications on file as of 12/06/2022.    Past Medical History:  Diagnosis Date   Aortic atherosclerosis (HCC)    Cervical spinal stenosis    Chronic kidney disease    COPD (chronic obstructive pulmonary disease) (HCC)    Coronary artery calcification seen on CT scan    ETOH abuse    a.) 28+ standard drinks/week   GERD (gastroesophageal reflux disease)    History of amiodarone therapy    a.) in the past; self discontinued   History of medication noncompliance    a.) reported 10/2022 that he has been off NOAC for several months secondary to vertiginous symptoms, not acting like himself, and depression; b.) off/not taking as prescribed: antiarrhythmics, antihypertensives, statins, and MDIs   Hyperlipidemia    Hypertension    Long term current use of anticoagulant    a.) rivaroxaban   PAF (paroxysmal atrial  fibrillation) ([place])    a.) CHA2DS2VASc = 3 (age, HTN, vascular disease history);  b.) rate/rhythm maintained on oral carvediolol (self discontinued diltiazem + amiodarone + metoprolol); chronically anticoagulated with rivaroxaban (switched from apixaban 11/17/2022)   Tobacco abuse     Past Surgical History:  Procedure Laterality Date   TONSILLECTOMY      Family History  Problem Relation Age of Onset   Emphysema Father    CAD Father        CABG   Stroke Mother    CAD Brother 37       CABG   Hypertension Brother     Social History   Socioeconomic History   Marital status: Married    Spouse name: Not on file   Number of children: 2   Years of education: Not on file   Highest education level: Not on file  Occupational History   Occupation: retired  Tobacco Use   Smoking status: Former    Packs/day: 0.25    Years: 50.00    Total pack years: 12.50    Types: Cigarettes    Start date: 12/19/1970   Smokeless tobacco: Former  Scientific laboratory technician Use: Never used  Substance and Sexual Activity   Alcohol use: Yes    Alcohol/week: 28.0 standard drinks of alcohol    Types: 14 Cans of beer, 14 Shots of liquor per week    Comment: did  not drink yesterday, was shaky today and wife gave him one shot pta   Drug use: No   Sexual activity: Yes    Partners: Female    Birth control/protection: None  Other Topics Concern   Not on file  Social History Narrative   Lives with girlfriend.     Social Determinants of Health   Financial Resource Strain: Low Risk  (12/01/2022)   Overall Financial Resource Strain (CARDIA)    Difficulty of Paying Living Expenses: Not hard at all  Food Insecurity: No Food Insecurity (12/01/2022)   Hunger Vital Sign    Worried About Running Out of Food in the Last Year: Never true    Ran Out of Food in the Last Year: Never true  Transportation Needs: No Transportation Needs (12/01/2022)   PRAPARE - Hydrologist (Medical): No     Lack of Transportation (Non-Medical): No  Physical Activity: Sufficiently Active (12/01/2022)   Exercise Vital Sign    Days of Exercise per Week: 5 days    Minutes of Exercise per Session: 30 min  Stress: No Stress Concern Present (12/01/2022)   [place]    Feeling of Stress : Not at all  Social Connections: [place] (12/01/2022)   Social Connection and Isolation Panel [NHANES]    Frequency of Communication with Friends and Family: More than three times a week    Frequency of Social Gatherings with Friends and Family: More than three times a week    Attends Religious Services: More than 4 times per year    Active Member of [employer] or Organizations: Yes    Attends Archivist Meetings: More than 4 times per year    Marital Status: Married  Human resources officer Violence: Not At Risk (12/01/2022)   Humiliation, Afraid, Rape, and Kick questionnaire    Fear of Current or Ex-Partner: No    Emotionally Abused: No    Physically Abused: No    Sexually Abused: No    Review of Systems  All other systems reviewed and are negative.       Objective    BP 123/85   Pulse 86   Temp 98.1 F (36.7 C) (Oral)   Resp 16   Wt 158 lb (71.7 kg)   BMI 21.43 kg/m   Physical Exam Vitals and nursing note reviewed.  Constitutional:      General: He is not in acute distress. Cardiovascular:     Rate and Rhythm: Normal rate and regular rhythm.  Pulmonary:     Effort: Pulmonary effort is normal.     Breath sounds: Normal breath sounds.  Abdominal:     Palpations: Abdomen is soft.     Tenderness: There is no abdominal tenderness.  Neurological:     General: No focal deficit present.     Mental Status: He is alert and oriented to person, place, and time.         Assessment & Plan:   1. Essential hypertension Appears stable. Continue   2. Gastroesophageal reflux disease, unspecified whether esophagitis  present Change from protonix to pepcid prn and monitor    Return in about 6 months (around 06/07/2023) for follow up.   Becky Sax, MD

## 2022-12-06 NOTE — Progress Notes (Unsigned)
Patient request medication refill.   SHOULDER ARTHROSCOPY WITH DEBRIDEMENT, DECOMPRESSION, ROTATOR CUFF REPAIR AND BICEPS TENODESI  12/07/2022

## 2022-12-06 NOTE — Progress Notes (Signed)
Perioperative Services  Pre-Admission/Anesthesia Testing Clinical Review  Date: 12/06/22  Patient Demographics:  Name: Ronald Lowe DOB:   1956/03/23 MRN:   [id number]  Planned Surgical Procedure(s):    Case: [id number] Date/Time: 12/07/22 0715   Procedure: SHOULDER ARTHROSCOPY WITH DEBRIDEMENT, DECOMPRESSION, ROTATOR CUFF REPAIR AND BICEPS TENODESIS.- RNFA (Left: Shoulder)   Anesthesia type: Choice   Pre-op diagnosis:      Chronic left shoulder pain M25.512, G89.29     Nontraumatic complete tear of left rotator cuff M75.122     Rotator cuff tendinitis, left M75.82     Tendinitis of upper biceps tendon of left shoulder M75.22   Location: [facility] OR ROOM 02 / [facility]   Surgeons: Corky Mull, MD   NOTE: Available PAT nursing documentation and vital signs have been reviewed. Clinical nursing staff has updated patient's PMH/PSHx, current medication list, and drug allergies/intolerances to ensure comprehensive history available to assist in medical decision making as it pertains to the aforementioned surgical procedure and anticipated anesthetic course. Extensive review of available clinical information performed.  PMH and PSHx updated with any diagnoses/procedures that  may have been inadvertently omitted during his intake with the pre-admission testing department's nursing staff.  Clinical Discussion:  Ronald Lowe is a 66 y.o. male who is submitted for pre-surgical anesthesia review and clearance prior to him undergoing the above procedure. . Patient is a Former Smoker (12.5 pack years). Pertinent PMH includes: CAD, PAF, aortic atherosclerosis, HTN, HLD, CKD, GERD (on daily PPI), COPD, cervical spinal stenosis, ETOH and tobacco abuse, medical noncompliance.  Patient is followed by cardiology [name], MD). He was last seen in the cardiology clinic on 11/17/2022; notes reviewed. At the time of his clinic visit, patient stable from a cardiovascular  perspective.  He denied any episodes of significant chest pains, shortness breath, PND, orthopnea, palpitations, significant peripheral edema, vertiginous symptoms, or presyncope/syncope. Patient continued to consume excessive amounts of ETOH on a weekly basis (28 + standard drinks/week). Patient found to be noncompliant with most of his prescribed medical therapies. Despite education regarding increased risks for lung cancer, cirrhosis, stroke, AECOPD, etc., patient advising that he was not taking his antiarrhythmics, antihypertensives, statins, anticoagulation therapy, or COPD medications as prescribed. Various reasons cited for each of these medications. Patient with a past medical history significant for cardiovascular diagnoses.  Most recent TTE was performed on 06/18/2021 revealing a low normal left ventricular systolic function with an EF of 50-55%.  There were no regional wall motion abnormalities. Left ventricular diastolic Doppler parameters consistent with abnormal relaxation (G1DD).  Right ventricular size and function normal.  There was trivial mitral and aortic valve regurgitation.  There was no evidence of any significant transvalvular gradient suggestive of valvular stenosis.  Patient with an atrial fibrillation diagnosis; CHA2DS2-VASc Score = 3 (age, HTN, vascular disease history). He has been prescribed oral amiodarone + diltiazem + metoprolol for rate and rhythm management, in addition to apixaban for chronic oral anticoagulation. With that being said, at the time of his visit with cardiology, patient was off all interventions, including NOAC, for this atrial fibrillation. Patient citing that the aforementioned interventions were caused him to such as "not act like himself", be depressed and fatigued, and caused him to experience vertiginous symptoms. Blood pressure was controlled at 119/80 mmHg. Patient not taking any type of lipid lowering therapies for his HLD diagnosis or ASCVD prevention.  He is not diabetic. Patient does not have an OSAH diagnosis. Functional capacity, as defined  by DASI, is documented as being >/= 4 METS. In the setting of medical non-compliance, increased risk for CVA was discussed given patient's diagnosis of atrial fibrillation, HTN, and excessive ETOH use. Patient was started on alternative therapies for his atrial fibrillation, which included beta blocker (carvedilol) and anticoagulant (rivaroxaban). No other changes were made to his medication regimen. Patient to follow-up in 3 months or sooner if needed.  [name] is scheduled for an elective LEFT SHOULDER ARTHROSCOPY WITH DEBRIDEMENT, DECOMPRESSION, ROTATOR CUFF REPAIR AND BICEPS TENODESIS on 12/07/2022 with Dr. Milagros Evener, MD.  Given patient's past medical history significant for cardiovascular diagnoses, presurgical cardiac clearance was sought by the PAT team. Per cardiology, "Mr. Delair's perioperative risk of a major cardiac event is 0.4% according to the RCRI, Therefore he is a LOW risk for perioperative cardiovascular complications. His functional capacity is good at 5.62 METS according to the DASI. Based on ACC/AHA guidelines, no further cardiovascular testing needed. The patient may proceed to surgery at ACCEPTABLE risk".   In review of his medication reconciliation, it is noted that patient is currently on prescribed daily anticoagulation therapy. He has been instructed on recommendations for holding his rivaroxaban for 3 days prior to his procedure with plans to restart as soon as postoperative bleeding risk felt to be minimized by his attending surgeon. The patient has been instructed that his last dose of his rivaroxaban should be on 12/03/2022.   Patient denies previous perioperative complications with anesthesia in the past.  In review his EMR, there are no records available for review pertaining to past procedural/anesthetic courses within the [facility] system.     12/01/2022    3:05 PM  11/17/2022    3:15 PM 11/07/2022   10:37 AM  Vitals with BMI  Height '6\' 0"'$  '6\' 0"'$  '6\' 0"'$   Weight 162 lbs 162 lbs 160 lbs  BMI 21.97 78.29 56.2  Systolic  130   Diastolic  80   Pulse  865     Providers/Specialists:   NOTE: Primary physician provider listed below. Patient may have been seen by APP or partner within same practice.   PROVIDER ROLE / SPECIALTY LAST OV  Poggi, Marshall Cork, MD Orthopedics (Surgeon) 11/28/2022  Dorna Mai, MD Primary Care Provider 03/14/2022  Jenkins Rouge, MD Cardiology 11/17/2022   Allergies:  Patient has no known allergies.  Current Home Medications:   No current facility-administered medications for this encounter.    albuterol (VENTOLIN HFA) 108 (90 Base) MCG/ACT inhaler   carvedilol (COREG) 3.125 MG tablet   pantoprazole (PROTONIX) 40 MG tablet   rivaroxaban (XARELTO) 20 MG TABS tablet   TRELEGY ELLIPTA 100-62.5-25 MCG/ACT AEPB   History:   Past Medical History:  Diagnosis Date   Aortic atherosclerosis (HCC)    Cervical spinal stenosis    Chronic kidney disease    COPD (chronic obstructive pulmonary disease) (HCC)    Coronary artery calcification seen on CT scan    ETOH abuse    a.) 28+ standard drinks/week   GERD (gastroesophageal reflux disease)    History of amiodarone therapy    a.) in the past; self discontinued   History of medication noncompliance    a.) reported 10/2022 that he has been off NOAC for several months secondary to vertiginous symptoms, not acting like himself, and depression; b.) off/not taking as prescribed: antiarrhythmics, antihypertensives, statins, and MDIs   Hyperlipidemia    Hypertension    Long term current use of anticoagulant    a.) rivaroxaban  PAF (paroxysmal atrial fibrillation) (HCC)    a.) CHA2DS2VASc = 3 (age, HTN, vascular disease history);  b.) rate/rhythm maintained on oral carvediolol (self discontinued diltiazem + amiodarone + metoprolol); chronically anticoagulated with rivaroxaban  (switched from apixaban 11/17/2022)   Tobacco abuse    Past Surgical History:  Procedure Laterality Date   TONSILLECTOMY     Family History  Problem Relation Age of Onset   Emphysema Father    CAD Father        CABG   Stroke Mother    CAD Brother 62       CABG   Hypertension Brother    Social History   Tobacco Use   Smoking status: Former    Packs/day: 0.25    Years: 50.00    Total pack years: 12.50    Types: Cigarettes    Start date: 12/19/1970   Smokeless tobacco: Former  Scientific laboratory technician Use: Never used  Substance Use Topics   Alcohol use: Yes    Alcohol/week: 28.0 standard drinks of alcohol    Types: 14 Cans of beer, 14 Shots of liquor per week    Comment: did not drink yesterday, was shaky today and wife gave him one shot pta   Drug use: No    Pertinent Clinical Results:  LABS: Labs reviewed: Acceptable for surgery.  No visits with results within 3 Day(s) from this visit.  Latest known visit with results is:  Hospital Outpatient Visit on 11/07/2022  Component Date Value Ref Range Status   Sodium 11/07/2022 139  135 - 145 mmol/L Final   Potassium 11/07/2022 3.9  3.5 - 5.1 mmol/L Final   Chloride 11/07/2022 103  98 - 111 mmol/L Final   CO2 11/07/2022 27  22 - 32 mmol/L Final   Glucose, Bld 11/07/2022 92  70 - 99 mg/dL Final   Glucose reference range applies only to samples taken after fasting for at least 8 hours.   BUN 11/07/2022 14  8 - 23 mg/dL Final   Creatinine, Ser 11/07/2022 1.04  0.61 - 1.24 mg/dL Final   Calcium 11/07/2022 9.0  8.9 - 10.3 mg/dL Final   GFR, Estimated 11/07/2022 >60  >60 mL/min Final   Comment: (NOTE) Calculated using the CKD-EPI Creatinine Equation (2021)    Anion gap 11/07/2022 9  5 - 15 Final   Performed at [facility], [address] [place], [place] [address]   WBC 11/07/2022 5.3  4.0 - 10.5 K/uL Final   RBC 11/07/2022 4.82  4.22 - 5.81 MIL/uL Final   Hemoglobin 11/07/2022 17.0  13.0 - 17.0 g/dL Final   HCT  11/07/2022 47.9  39.0 - 52.0 % Final   MCV 11/07/2022 99.4  80.0 - 100.0 fL Final   MCH 11/07/2022 35.3 (H)  26.0 - 34.0 pg Final   MCHC 11/07/2022 35.5  30.0 - 36.0 g/dL Final   RDW 11/07/2022 13.4  11.5 - 15.5 % Final   Platelets 11/07/2022 223  150 - 400 K/uL Final   nRBC 11/07/2022 0.0  0.0 - 0.2 % Final   Performed at [facility], [address] [place], [place] [address]    ECG: Date: 11/17/2022 Time ECG obtained: 1506 PM Rate: 102 bpm Rhythm: sinus tachycardia Axis (leads I and aVF): Normal Intervals: PR 144 ms. QRS 98 ms. QTc 484 ms. ST segment and T wave changes: No evidence of acute ST segment elevation or depression Comparison: Similar to previous tracing obtained on 11/07/2022  IMAGING / PROCEDURES: DIAGNOSTIC RADIOGRAPHS OF LEFT SHOULDER COMPLETE MINIMUM 2 VIEWS performed on 10/10/2022 Mild degenerative changes as manifest by mild joint space narrowing inferiorly, best seen on the AP view.   The subacromial space is well-maintained.   There is no subacromial or infra-clavicular spurring.   He demonstrates a Type II acromion.   CT CHEST LUNG CANCER SCREENING LOW DOSE WO CONTRAST performed on 07/18/2022 Lung RADS 1-negative.  Continue annual screening with low-dose chest CT without contrast in 12 months. Aortic atherosclerosis Coronary artery calcification Emphysema  MR BRACHIAL PLEXUS W/O CM LT performed on 05/18/2022 No abnormality is seen along the course of the LEFT brachial plexus Moderate degenerative changes of the LEFT sternoclavicular and acromioclavicular joints No acute LEFT clavicle fracture  MR SHOULDER LEFT WO CONTRAST performed on 05/18/2022 Moderate to high-grade partial-thickness articular sided tearing of the anterior greater than mid aspect of the supraspinatus tendon footprint in a region measuring up to 2.6 cm in AP dimension. Mild-to-moderate supraspinatus and anterior infraspinatus muscle atrophy. Moderate degenerative changes  of the acromioclavicular joint. Mild-to-moderate glenohumeral osteoarthritis.  TRANSTHORACIC ECHOCARDIOGRAM performed on 06/18/2021 Left ventricular ejection fraction, by estimation, is 50 to 55%. The left ventricle has low normal function. The left ventricle has no regional wall motion abnormalities. Left ventricular diastolic parameters are consistent with Grade I diastolic dysfunction (impaired relaxation).  Right ventricular systolic function is normal. The right ventricular size is normal.  The mitral valve was not well visualized. Trivial mitral valve regurgitation.  The aortic valve was not well visualized. Aortic valve regurgitation is trivial.   Impression and Plan:  Ronald Lowe has been referred for pre-anesthesia review and clearance prior to him undergoing the planned anesthetic and procedural courses. Available labs, pertinent testing, and imaging results were personally reviewed by me. This patient has been appropriately cleared by cardiology with an overall LOW/ACCEPTABLE risk of significant perioperative cardiovascular complications.  Based on clinical review performed today (12/06/22), barring any significant acute changes in the patient's overall condition, it is anticipated that he will be able to proceed with the planned surgical intervention. Any acute changes in clinical condition may necessitate his procedure being postponed and/or cancelled. Patient will meet with anesthesia team (MD and/or CRNA) on the day of his procedure for preoperative evaluation/assessment. Questions regarding anesthetic course will be fielded at that time.   Pre-surgical instructions were reviewed with the patient during his PAT appointment and questions were fielded by PAT clinical staff. Patient was advised that if any questions or concerns arise prior to his procedure then he should return a call to PAT and/or his surgeon's office to discuss.  Honor Loh, MSN, APRN, FNP-C, CEN [facility]  Peri-operative Services Nurse Practitioner Phone: [phone] Fax: [phone] 12/06/22 10:05 AM  NOTE: This note has been prepared using Dragon dictation software. Despite my best ability to proofread, there is always the potential that unintentional transcriptional errors may still occur from this process.

## 2022-12-06 NOTE — Patient Instructions (Addendum)
Your procedure is scheduled on: Wednesday, December 20 Report to the Registration Desk on the 1st floor of the [facility]. To find out your arrival time, please call [phone] between 1PM - 3PM on: Tuesday, December 19 If your arrival time is 6:00 am, do not arrive prior to that time as the [facility] entrance doors do not open until 6:00 am.  REMEMBER: Instructions that are not followed completely may result in serious medical risk, up to and including death; or upon the discretion of your surgeon and anesthesiologist your surgery may need to be rescheduled.  Do not eat food after midnight the night before surgery.  No gum chewing, lozengers or hard candies.  You may however, drink CLEAR liquids up to 2 hours before you are scheduled to arrive for your surgery. Do not drink anything within 2 hours of your scheduled arrival time.  Clear liquids include: - water  - apple juice without pulp - gatorade (not RED colors) - black coffee or tea (Do NOT add milk or creamers to the coffee or tea) Do NOT drink anything that is not on this list.  In addition, your doctor has ordered for you to drink the provided  Ensure Pre-Surgery Clear Carbohydrate Drink  Drinking this carbohydrate drink up to two hours before surgery helps to reduce insulin resistance and improve patient outcomes. Please complete drinking 2 hours prior to scheduled arrival time.  TAKE THESE MEDICATIONS THE MORNING OF SURGERY WITH A SIP OF WATER:  Carvedilol Trelegy inhaler  Use inhalers on the day of surgery and bring albuterol inhaler to the hospital.  Follow recommendations from Cardiologist or PCP regarding stopping Xarelto. Per cardiology note: hold for 3 days prior to surgery. Last day to have taken is Saturday, December 16. Resume AFTER surgery per surgeon instruction.  One week prior to surgery: Stop Anti-inflammatories (NSAIDS) such as Advil, Aleve, Ibuprofen, Motrin, Naproxen, Naprosyn and Aspirin based  products such as Excedrin, Goodys Powder, BC Powder. Stop ANY OVER THE COUNTER supplements until after surgery. You may however, continue to take Tylenol if needed for pain up until the day of surgery.  No Alcohol for 24 hours before or after surgery.  No Smoking including e-cigarettes for 24 hours prior to surgery.  No chewable tobacco products for at least 6 hours prior to surgery.  No nicotine patches on the day of surgery.  Do not use any "recreational" drugs for at least a week prior to your surgery.  Please be advised that the combination of cocaine and anesthesia may have negative outcomes, up to and including death. If you test positive for cocaine, your surgery will be cancelled.  On the morning of surgery brush your teeth with toothpaste and water, you may rinse your mouth with mouthwash if you wish. Do not swallow any toothpaste or mouthwash.  Use CHG Soap as directed on instruction sheet.  Do not wear jewelry, make-up, hairpins, clips or nail polish.  Do not wear lotions, powders, or perfumes.   Do not shave body from the neck down 48 hours prior to surgery just in case you cut yourself which could leave a site for infection.  Also, freshly shaved skin may become irritated if using the CHG soap.  Contact lenses, hearing aids and dentures may not be worn into surgery.  Do not bring valuables to the hospital. [facility] is not responsible for any missing/lost belongings or valuables.   Notify your doctor if there is any change in your medical condition (  cold, fever, infection).  Wear comfortable clothing (specific to your surgery type) to the hospital.  After surgery, you can help prevent lung complications by doing breathing exercises.  Take deep breaths and cough every 1-2 hours. Your doctor may order a device called an Incentive Spirometer to help you take deep breaths.  If you are being discharged the day of surgery, you will not be allowed to drive home. You will  need a responsible adult (18 years or older) to drive you home and stay with you that night.   If you are taking public transportation, you will need to have a responsible adult (18 years or older) with you. Please confirm with your physician that it is acceptable to use public transportation.   Please call the [facility] at [phone] if you have any questions about these instructions.  Surgery Visitation Policy:  Patients undergoing a surgery or procedure may have two family members or support persons with them as long as the person is not COVID-19 positive or experiencing its symptoms.      Preparing for Surgery with CHLORHEXIDINE GLUCONATE (CHG) Soap  Chlorhexidine Gluconate (CHG) Soap  o An antiseptic cleaner that kills germs and bonds with the skin to continue killing germs even after washing  o Used for showering the night before surgery and morning of surgery  Before surgery, you can play an important role by reducing the number of germs on your skin.  CHG (Chlorhexidine gluconate) soap is an antiseptic cleanser which kills germs and bonds with the skin to continue killing germs even after washing.  Please do not use if you have an allergy to CHG or antibacterial soaps. If your skin becomes reddened/irritated stop using the CHG.  1. Shower the NIGHT BEFORE SURGERY and the MORNING OF SURGERY with CHG soap.  2. If you choose to wash your hair, wash your hair first as usual with your normal shampoo.  3. After shampooing, rinse your hair and body thoroughly to remove the shampoo.  4. Use CHG as you would any other liquid soap. You can apply CHG directly to the skin and wash gently with a scrungie or a clean washcloth.  5. Apply the CHG soap to your body only from the neck down. Do not use on open wounds or open sores. Avoid contact with your eyes, ears, mouth, and genitals (private parts). Wash face and genitals (private parts) with your normal soap.  6.  Wash thoroughly, paying special attention to the area where your surgery will be performed.  7. Thoroughly rinse your body with warm water.  8. Do not shower/wash with your normal soap after using and rinsing off the CHG soap.  9. Pat yourself dry with a clean towel.  10. Wear clean pajamas to bed the night before surgery.  12. Place clean sheets on your bed the night of your first shower and do not sleep with pets.  13. Shower again with the CHG soap on the day of surgery prior to arriving at the hospital.  14. Do not apply any deodorants/lotions/powders.  15. Please wear clean clothes to the hospital.

## 2022-12-07 ENCOUNTER — Ambulatory Visit: Payer: Medicare HMO

## 2022-12-07 ENCOUNTER — Encounter: Payer: Self-pay | Admitting: Family Medicine

## 2022-12-07 ENCOUNTER — Ambulatory Visit: Payer: Medicare HMO | Admitting: Urgent Care

## 2022-12-07 ENCOUNTER — Ambulatory Visit
Admission: RE | Admit: 2022-12-07 | Discharge: 2022-12-07 | Disposition: A | Discharge status: Home / Self Care | Payer: Medicare HMO | Attending: Surgery | Admitting: Surgery

## 2022-12-07 ENCOUNTER — Encounter
Admission: RE | Disposition: A | Discharge status: Home / Self Care | Payer: Self-pay | Source: Home / Self Care | Attending: Surgery

## 2022-12-07 ENCOUNTER — Other Ambulatory Visit: Payer: Self-pay

## 2022-12-07 ENCOUNTER — Encounter: Payer: Self-pay | Admitting: Surgery

## 2022-12-07 DIAGNOSIS — E785 Hyperlipidemia, unspecified: Secondary | ICD-10-CM | POA: Insufficient documentation

## 2022-12-07 DIAGNOSIS — Z7901 Long term (current) use of anticoagulants: Secondary | ICD-10-CM | POA: Insufficient documentation

## 2022-12-07 DIAGNOSIS — M7522 Bicipital tendinitis, left shoulder: Secondary | ICD-10-CM | POA: Diagnosis not present

## 2022-12-07 DIAGNOSIS — K219 Gastro-esophageal reflux disease without esophagitis: Secondary | ICD-10-CM | POA: Diagnosis not present

## 2022-12-07 DIAGNOSIS — N189 Chronic kidney disease, unspecified: Secondary | ICD-10-CM | POA: Diagnosis not present

## 2022-12-07 DIAGNOSIS — M75122 Complete rotator cuff tear or rupture of left shoulder, not specified as traumatic: Secondary | ICD-10-CM | POA: Insufficient documentation

## 2022-12-07 DIAGNOSIS — Z87891 Personal history of nicotine dependence: Secondary | ICD-10-CM | POA: Diagnosis not present

## 2022-12-07 DIAGNOSIS — I129 Hypertensive chronic kidney disease with stage 1 through stage 4 chronic kidney disease, or unspecified chronic kidney disease: Secondary | ICD-10-CM | POA: Insufficient documentation

## 2022-12-07 DIAGNOSIS — M7542 Impingement syndrome of left shoulder: Secondary | ICD-10-CM | POA: Diagnosis not present

## 2022-12-07 DIAGNOSIS — I480 Paroxysmal atrial fibrillation: Secondary | ICD-10-CM | POA: Diagnosis not present

## 2022-12-07 DIAGNOSIS — R0602 Shortness of breath: Secondary | ICD-10-CM | POA: Diagnosis not present

## 2022-12-07 DIAGNOSIS — G8918 Other acute postprocedural pain: Secondary | ICD-10-CM | POA: Diagnosis not present

## 2022-12-07 DIAGNOSIS — J449 Chronic obstructive pulmonary disease, unspecified: Secondary | ICD-10-CM | POA: Insufficient documentation

## 2022-12-07 DIAGNOSIS — I10 Essential (primary) hypertension: Secondary | ICD-10-CM | POA: Diagnosis not present

## 2022-12-07 DIAGNOSIS — I700 Atherosclerosis of aorta: Secondary | ICD-10-CM | POA: Insufficient documentation

## 2022-12-07 DIAGNOSIS — M7582 Other shoulder lesions, left shoulder: Secondary | ICD-10-CM | POA: Diagnosis not present

## 2022-12-07 DIAGNOSIS — M67814 Other specified disorders of tendon, left shoulder: Secondary | ICD-10-CM | POA: Insufficient documentation

## 2022-12-07 HISTORY — DX: Personal history of other drug therapy: Z92.29

## 2022-12-07 HISTORY — PX: SHOULDER ARTHROSCOPY WITH SUBACROMIAL DECOMPRESSION, ROTATOR CUFF REPAIR AND BICEP TENDON REPAIR: SHX5687

## 2022-12-07 SURGERY — SHOULDER ARTHROSCOPY WITH SUBACROMIAL DECOMPRESSION, ROTATOR CUFF REPAIR AND BICEP TENDON REPAIR
Anesthesia: General | Site: Shoulder | Laterality: Left

## 2022-12-07 MED ORDER — CHLORHEXIDINE GLUCONATE 0.12 % MT SOLN
OROMUCOSAL | Status: AC
  Administered 2022-12-07: 15 mL via OROMUCOSAL
  Filled 2022-12-07: qty 15

## 2022-12-07 MED ORDER — FENTANYL CITRATE PF 50 MCG/ML IJ SOSY: 50.0000 ug | PREFILLED_SYRINGE | Freq: Once | INTRAMUSCULAR | Status: AC

## 2022-12-07 MED ORDER — KETOROLAC TROMETHAMINE 15 MG/ML IJ SOLN: 15.0000 mg | Freq: Once | INTRAMUSCULAR | Status: AC

## 2022-12-07 MED ORDER — ONDANSETRON HCL 4 MG/2ML IJ SOLN
INTRAMUSCULAR | Status: DC | PRN
  Administered 2022-12-07: 4 mg via INTRAVENOUS

## 2022-12-07 MED ORDER — OXYCODONE HCL 5 MG PO TABS: 5.0000 mg | ORAL_TABLET | ORAL | 0 refills | Status: DC | PRN

## 2022-12-07 MED ORDER — FENTANYL CITRATE (PF) 100 MCG/2ML IJ SOLN
INTRAMUSCULAR | Status: AC
  Filled 2022-12-07: qty 2

## 2022-12-07 MED ORDER — KETOROLAC TROMETHAMINE 15 MG/ML IJ SOLN
INTRAMUSCULAR | Status: AC
  Administered 2022-12-07: 15 mg via INTRAVENOUS
  Filled 2022-12-07: qty 1

## 2022-12-07 MED ORDER — SODIUM CHLORIDE 0.9 % IV SOLN: INTRAVENOUS | Status: DC

## 2022-12-07 MED ORDER — PROPOFOL 10 MG/ML IV BOLUS
INTRAVENOUS | Status: DC | PRN
  Administered 2022-12-07: 150 mg via INTRAVENOUS
  Administered 2022-12-07 (×2): 30 mg via INTRAVENOUS

## 2022-12-07 MED ORDER — CEFAZOLIN SODIUM-DEXTROSE 2-4 GM/100ML-% IV SOLN
INTRAVENOUS | Status: AC
  Filled 2022-12-07: qty 100

## 2022-12-07 MED ORDER — 0.9 % SODIUM CHLORIDE (POUR BTL) OPTIME
TOPICAL | Status: DC | PRN
  Administered 2022-12-07: 500 mL

## 2022-12-07 MED ORDER — PANTOPRAZOLE SODIUM 40 MG PO TBEC: 40.0000 mg | DELAYED_RELEASE_TABLET | Freq: Every day | ORAL | Status: DC | PRN

## 2022-12-07 MED ORDER — PROPOFOL 10 MG/ML IV BOLUS
INTRAVENOUS | Status: AC
  Filled 2022-12-07: qty 40

## 2022-12-07 MED ORDER — GLYCOPYRROLATE 0.2 MG/ML IJ SOLN
INTRAMUSCULAR | Status: AC
  Filled 2022-12-07: qty 1

## 2022-12-07 MED ORDER — DEXAMETHASONE SODIUM PHOSPHATE 10 MG/ML IJ SOLN
INTRAMUSCULAR | Status: DC | PRN
  Administered 2022-12-07: 10 mg via INTRAVENOUS

## 2022-12-07 MED ORDER — SUGAMMADEX SODIUM 200 MG/2ML IV SOLN
INTRAVENOUS | Status: DC | PRN
  Administered 2022-12-07: 200 mg via INTRAVENOUS

## 2022-12-07 MED ORDER — ONDANSETRON HCL 4 MG PO TABS: 4.0000 mg | ORAL_TABLET | Freq: Four times a day (QID) | ORAL | Status: DC | PRN

## 2022-12-07 MED ORDER — OXYCODONE HCL 5 MG PO TABS: 5.0000 mg | ORAL_TABLET | ORAL | Status: DC | PRN

## 2022-12-07 MED ORDER — FENTANYL CITRATE (PF) 100 MCG/2ML IJ SOLN: 25.0000 ug | INTRAMUSCULAR | Status: DC | PRN

## 2022-12-07 MED ORDER — METOCLOPRAMIDE HCL 5 MG/ML IJ SOLN: 5.0000 mg | Freq: Three times a day (TID) | INTRAMUSCULAR | Status: DC | PRN

## 2022-12-07 MED ORDER — PHENYLEPHRINE 80 MCG/ML (10ML) SYRINGE FOR IV PUSH (FOR BLOOD PRESSURE SUPPORT)
PREFILLED_SYRINGE | INTRAVENOUS | Status: AC
  Filled 2022-12-07: qty 10

## 2022-12-07 MED ORDER — BUPIVACAINE-EPINEPHRINE (PF) 0.5% -1:200000 IJ SOLN
INTRAMUSCULAR | Status: AC
  Filled 2022-12-07: qty 30

## 2022-12-07 MED ORDER — PROMETHAZINE HCL 25 MG/ML IJ SOLN: 6.2500 mg | INTRAMUSCULAR | Status: DC | PRN

## 2022-12-07 MED ORDER — MIDAZOLAM HCL 2 MG/2ML IJ SOLN: 1.0000 mg | Freq: Once | INTRAMUSCULAR | Status: AC

## 2022-12-07 MED ORDER — LACTATED RINGERS IV SOLN
INTRAVENOUS | Status: DC | PRN
  Administered 2022-12-07: 3001 mL

## 2022-12-07 MED ORDER — LIDOCAINE HCL (PF) 1 % IJ SOLN
INTRAMUSCULAR | Status: DC | PRN
  Administered 2022-12-07: 4 mL via SUBCUTANEOUS

## 2022-12-07 MED ORDER — MIDAZOLAM HCL 2 MG/2ML IJ SOLN
INTRAMUSCULAR | Status: AC
  Administered 2022-12-07: 1 mg via INTRAVENOUS
  Filled 2022-12-07: qty 2

## 2022-12-07 MED ORDER — BUPIVACAINE-EPINEPHRINE 0.5% -1:200000 IJ SOLN
INTRAMUSCULAR | Status: DC | PRN
  Administered 2022-12-07: 30 mL

## 2022-12-07 MED ORDER — ROCURONIUM BROMIDE 10 MG/ML (PF) SYRINGE
PREFILLED_SYRINGE | INTRAVENOUS | Status: AC
  Filled 2022-12-07: qty 10

## 2022-12-07 MED ORDER — PHENYLEPHRINE HCL-NACL 20-0.9 MG/250ML-% IV SOLN
INTRAVENOUS | Status: AC
  Filled 2022-12-07: qty 250

## 2022-12-07 MED ORDER — FENTANYL CITRATE PF 50 MCG/ML IJ SOSY
PREFILLED_SYRINGE | INTRAMUSCULAR | Status: AC
  Administered 2022-12-07: 50 ug via INTRAVENOUS
  Filled 2022-12-07: qty 1

## 2022-12-07 MED ORDER — MIDAZOLAM HCL 2 MG/2ML IJ SOLN
INTRAMUSCULAR | Status: DC | PRN
  Administered 2022-12-07 (×2): 1 mg via INTRAVENOUS

## 2022-12-07 MED ORDER — EPHEDRINE SULFATE (PRESSORS) 50 MG/ML IJ SOLN
INTRAMUSCULAR | Status: DC | PRN
  Administered 2022-12-07: 5 mg via INTRAVENOUS

## 2022-12-07 MED ORDER — ROCURONIUM BROMIDE 100 MG/10ML IV SOLN
INTRAVENOUS | Status: DC | PRN
  Administered 2022-12-07: 50 mg via INTRAVENOUS
  Administered 2022-12-07: 20 mg via INTRAVENOUS

## 2022-12-07 MED ORDER — BUPIVACAINE LIPOSOME 1.3 % IJ SUSP
INTRAMUSCULAR | Status: DC | PRN
  Administered 2022-12-07: 20 mL via PERINEURAL

## 2022-12-07 MED ORDER — PHENYLEPHRINE HCL-NACL 20-0.9 MG/250ML-% IV SOLN
INTRAVENOUS | Status: DC | PRN
  Administered 2022-12-07: 10 ug/min via INTRAVENOUS

## 2022-12-07 MED ORDER — EPINEPHRINE PF 1 MG/ML IJ SOLN
INTRAMUSCULAR | Status: AC
  Filled 2022-12-07: qty 4

## 2022-12-07 MED ORDER — EPHEDRINE 5 MG/ML INJ
INTRAVENOUS | Status: AC
  Filled 2022-12-07: qty 5

## 2022-12-07 MED ORDER — FAMOTIDINE 20 MG PO TABS
ORAL_TABLET | ORAL | Status: AC
  Administered 2022-12-07: 20 mg via ORAL
  Filled 2022-12-07: qty 1

## 2022-12-07 MED ORDER — LIDOCAINE HCL (PF) 2 % IJ SOLN
INTRAMUSCULAR | Status: AC
  Filled 2022-12-07: qty 5

## 2022-12-07 MED ORDER — ONDANSETRON HCL 4 MG/2ML IJ SOLN: 4.0000 mg | Freq: Four times a day (QID) | INTRAMUSCULAR | Status: DC | PRN

## 2022-12-07 MED ORDER — DEXMEDETOMIDINE HCL IN NACL 80 MCG/20ML IV SOLN
INTRAVENOUS | Status: DC | PRN
  Administered 2022-12-07: 4 ug via BUCCAL

## 2022-12-07 MED ORDER — PHENYLEPHRINE 80 MCG/ML (10ML) SYRINGE FOR IV PUSH (FOR BLOOD PRESSURE SUPPORT)
PREFILLED_SYRINGE | INTRAVENOUS | Status: DC | PRN
  Administered 2022-12-07 (×3): 40 ug via INTRAVENOUS

## 2022-12-07 MED ORDER — LIDOCAINE HCL (PF) 1 % IJ SOLN
INTRAMUSCULAR | Status: AC
  Filled 2022-12-07: qty 5

## 2022-12-07 MED ORDER — MIDAZOLAM HCL 2 MG/2ML IJ SOLN
INTRAMUSCULAR | Status: AC
  Filled 2022-12-07: qty 2

## 2022-12-07 MED ORDER — METOCLOPRAMIDE HCL 10 MG PO TABS: 5.0000 mg | ORAL_TABLET | Freq: Three times a day (TID) | ORAL | Status: DC | PRN

## 2022-12-07 MED ORDER — BUPIVACAINE LIPOSOME 1.3 % IJ SUSP
INTRAMUSCULAR | Status: AC
  Filled 2022-12-07: qty 20

## 2022-12-07 MED ORDER — BUPIVACAINE HCL (PF) 0.5 % IJ SOLN
INTRAMUSCULAR | Status: AC
  Filled 2022-12-07: qty 10

## 2022-12-07 MED ORDER — BUPIVACAINE HCL (PF) 0.5 % IJ SOLN
INTRAMUSCULAR | Status: DC | PRN
  Administered 2022-12-07: 10 mL via PERINEURAL

## 2022-12-07 SURGICAL SUPPLY — 59 items
ANCH SUT 2 JK 1.5X2.9 2 LD (Anchor) ×1 IMPLANT
ANCH SUT 5.5 KNTLS (Anchor) ×2 IMPLANT
ANCH SUT Q-FX 2.8 (Anchor) ×2 IMPLANT
ANCHOR ALL-SUT Q-FIX 2.8 (Anchor) IMPLANT
ANCHOR HEALICOIL REGEN 5.5 (Anchor) IMPLANT
ANCHOR JUGGERKNOT WTAP NDL 2.9 (Anchor) IMPLANT
ANCHOR SUT JK SZ 2 2.9 DBL SL (Anchor) IMPLANT
ANCHOR SUT QUATTRO KNTLS 4.5 (Anchor) IMPLANT
ANCHOR SUT W/ ORTHOCORD (Anchor) IMPLANT
APL PRP STRL LF DISP 70% ISPRP (MISCELLANEOUS) ×1
BIT DRILL JUGRKNT W/NDL BIT2.9 (DRILL) IMPLANT
BLADE FULL RADIUS 3.5 (BLADE) ×1 IMPLANT
BUR ACROMIONIZER 4.0 (BURR) ×1 IMPLANT
CANNULA SHAVER 8MMX76MM (CANNULA) ×1 IMPLANT
CHLORAPREP W/TINT 26 (MISCELLANEOUS) ×1 IMPLANT
COVER MAYO STAND REUSABLE (DRAPES) ×1 IMPLANT
DILATOR 5.5 THREADED HEALICOIL (MISCELLANEOUS) IMPLANT
DRILL JUGGERKNOT W/NDL BIT 2.9 (DRILL) ×1
ELECT CAUTERY BLADE 6.4 (BLADE) ×1 IMPLANT
ELECT REM PT RETURN 9FT ADLT (ELECTROSURGICAL) ×1
ELECTRODE REM PT RTRN 9FT ADLT (ELECTROSURGICAL) ×1 IMPLANT
GAUZE SPONGE 4X4 12PLY STRL (GAUZE/BANDAGES/DRESSINGS) ×1 IMPLANT
GAUZE XEROFORM 1X8 LF (GAUZE/BANDAGES/DRESSINGS) ×1 IMPLANT
GLOVE BIO SURGEON STRL SZ7.5 (GLOVE) ×2 IMPLANT
GLOVE BIO SURGEON STRL SZ8 (GLOVE) ×2 IMPLANT
GLOVE BIOGEL PI IND STRL 8 (GLOVE) ×1 IMPLANT
GLOVE SURG UNDER LTX SZ8 (GLOVE) ×1 IMPLANT
GOWN STRL REUS W/ TWL LRG LVL3 (GOWN DISPOSABLE) ×1 IMPLANT
GOWN STRL REUS W/ TWL XL LVL3 (GOWN DISPOSABLE) ×1 IMPLANT
GOWN STRL REUS W/TWL LRG LVL3 (GOWN DISPOSABLE) ×1
GOWN STRL REUS W/TWL XL LVL3 (GOWN DISPOSABLE) ×1
GRASPER SUT 15 45D LOW PRO (SUTURE) IMPLANT
IV LACTATED RINGER IRRG 3000ML (IV SOLUTION) ×1
IV LR IRRIG 3000ML ARTHROMATIC (IV SOLUTION) ×2 IMPLANT
KIT CANNULA 8X76-LX IN CANNULA (CANNULA) ×1 IMPLANT
KIT SUTURE 2.8 Q-FIX DISP (MISCELLANEOUS) IMPLANT
MANIFOLD NEPTUNE II (INSTRUMENTS) ×2 IMPLANT
MASK FACE SPIDER DISP (MASK) ×1 IMPLANT
MAT ABSORB  FLUID 56X50 GRAY (MISCELLANEOUS) ×1
MAT ABSORB FLUID 56X50 GRAY (MISCELLANEOUS) ×1 IMPLANT
PACK ARTHROSCOPY SHOULDER (MISCELLANEOUS) ×1 IMPLANT
PAD ABD DERMACEA PRESS 5X9 (GAUZE/BANDAGES/DRESSINGS) ×2 IMPLANT
PASSER SUT FIRSTPASS SELF (INSTRUMENTS) IMPLANT
SLEEVE REMOTE CONTROL 5X12 (DRAPES) IMPLANT
SLING ARM LRG DEEP (SOFTGOODS) ×1 IMPLANT
SLING ULTRA II LG (MISCELLANEOUS) ×1 IMPLANT
SPONGE T-LAP 18X18 ~~LOC~~+RFID (SPONGE) ×1 IMPLANT
STAPLER SKIN PROX 35W (STAPLE) ×1 IMPLANT
STRAP SAFETY 5IN WIDE (MISCELLANEOUS) ×1 IMPLANT
SUT ETHIBOND 0 MO6 C/R (SUTURE) ×1 IMPLANT
SUT ULTRABRAID 2 COBRAID 38 (SUTURE) IMPLANT
SUT VIC AB 2-0 CT1 27 (SUTURE) ×2
SUT VIC AB 2-0 CT1 TAPERPNT 27 (SUTURE) ×2 IMPLANT
TAPE MICROFOAM 4IN (TAPE) ×1 IMPLANT
TRAP FLUID SMOKE EVACUATOR (MISCELLANEOUS) ×1 IMPLANT
TUBING CONNECTING 10 (TUBING) ×1 IMPLANT
TUBING INFLOW SET DBFLO PUMP (TUBING) ×1 IMPLANT
WAND WEREWOLF FLOW 90D (MISCELLANEOUS) ×1 IMPLANT
WATER STERILE IRR 500ML POUR (IV SOLUTION) ×1 IMPLANT

## 2022-12-07 NOTE — Anesthesia Procedure Notes (Signed)
Procedure Name: Intubation Date/Time: 12/07/2022 7:58 AM  Performed by: Adalberto Ill, CRNAPre-anesthesia Checklist: Emergency Drugs available, Suction available, Patient being monitored, Timeout performed and Patient identified Patient Re-evaluated:Patient Re-evaluated prior to induction Oxygen Delivery Method: Circle system utilized Preoxygenation: Pre-oxygenation with 100% oxygen Induction Type: IV induction Ventilation: Mask ventilation without difficulty Laryngoscope Size: Miller and 2 Grade View: Grade I Tube type: Oral Tube size: 7.5 mm Number of attempts: 1 Airway Equipment and Method: Stylet Placement Confirmation: ETT inserted through vocal cords under direct vision, positive ETCO2 and breath sounds checked- equal and bilateral Secured at: 21 cm Tube secured with: Tape Dental Injury: Teeth and Oropharynx as per pre-operative assessment  Comments: Brief atraumatic

## 2022-12-07 NOTE — Transfer of Care (Signed)
Immediate Anesthesia Transfer of Care Note  Patient: Ronald Lowe  Procedure(s) Performed: SHOULDER ARTHROSCOPY WITH DEBRIDEMENT, DECOMPRESSION, ROTATOR CUFF REPAIR AND BICEPS TENODESIS.- RNFA (Left: Shoulder)  Patient Location: PACU  Anesthesia Type:General and GA combined with regional for post-op pain  Level of Consciousness: awake, alert , drowsy, and patient cooperative  Airway & Oxygen Therapy: Patient Spontanous Breathing and Patient connected to face mask oxygen  Post-op Assessment: Report given to RN and Post -op Vital signs reviewed and stable  Post vital signs: Reviewed and stable  Last Vitals:  Vitals Value Taken Time  BP 170/98 12/07/22 0941  Temp    Pulse 83 12/07/22 0947  Resp 20 12/07/22 0948  SpO2 100 % 12/07/22 0947  Vitals shown include unvalidated device data.  Last Pain:  Vitals:   12/07/22 1165  TempSrc: Temporal  PainSc: 0-No pain         Complications: No notable events documented.

## 2022-12-07 NOTE — Discharge Instructions (Addendum)
Orthopedic discharge instructions: Keep dressing dry and intact.  May shower after dressing changed on post-op day #4 (Sunday).  Cover staples with Band-Aids after drying off. Apply ice frequently to shoulder. Take ibuprofen 600-800 mg TID with meals for 5-7 days, then as necessary. Take oxycodone as prescribed when needed.  May supplement with ES Tylenol if necessary. Keep shoulder immobilizer on at all times except may remove for bathing purposes. Follow-up in 10-14 days or as scheduled.AMBULATORY SURGERY  DISCHARGE INSTRUCTIONS   The drugs that you were given will stay in your system until tomorrow so for the next 24 hours you should not:  Drive an automobile Make any legal decisions Drink any alcoholic beverage   You may resume regular meals tomorrow.  Today it is better to start with liquids and gradually work up to solid foods.  You may eat anything you prefer, but it is better to start with liquids, then soup and crackers, and gradually work up to solid foods.   Please notify your doctor immediately if you have any unusual bleeding, trouble breathing, redness and pain at the surgery site, drainage, fever, or pain not relieved by medication.    Additional Instructions:        Please contact your physician with any problems or Same Day Surgery at [phone], Monday through Friday 6 am to 4 pm, or  at [facility] number at [phone].

## 2022-12-07 NOTE — H&P (Signed)
History of Present Illness: Ronald Lowe is a 66 y.o. male who presents today for his surgical history and physical for upcoming left shoulder arthroscopy with debridement, decompression, rotator cuff repair and biceps tenodesis. The patient is scheduled to undergo the surgery with Dr. Roland Rack on 12/07/2022. The patient was originally scheduled to undergo this procedure in November of this year however he had to undergo cardiac clearance prior to surgery. The patient does state that his pain in the left shoulder has improved since he was last evaluated, he reports a 5 out of 10 pain score at today's visit. The patient denies any repeat trauma or injury affecting the left shoulder since his last evaluation. The patient does have a history of A-fib for which he takes Xarelto. The patient denies any personal history of asthma or COPD. No personal history of blood clots. Patient denies any history of diabetes.  Past Medical History: A-fib (CMS-HCC)  CKD (chronic kidney disease)  Hyperlipidemia  Hypertension   Past Surgical History:  TONSILLECTOMY   Past Family History: History reviewed. No pertinent family history.  Medications: albuterol 90 mcg/actuation inhaler TAKE 2 PUFFS BY MOUTH EVERY 6 HOURS AS NEEDED FOR WHEEZE OR SHORTNESS OF BREATH  AMIOdarone (PACERONE) 200 MG tablet Take 200 mg by mouth 2 (two) times daily  amLODIPine (NORVASC) 5 MG tablet Take 5 mg by mouth once daily  atorvastatin (LIPITOR) 40 MG tablet Take 40 mg by mouth once daily  carvediloL (COREG) 3.125 MG tablet Take 3.125 mg by mouth 2 (two) times daily  dilTIAZem (CARDIZEM CD) 240 MG CD capsule Take 240 mg by mouth once daily  fluticasone propion-salmeteroL (ADVAIR HFA) 45-21 mcg/actuation inhaler Inhale 2 inhalations into the lungs 2 (two) times daily  fluticasone-umeclidinium-vilanterol (TRELEGY ELLIPTA) 100-62.5-25 mcg inhaler Take 1 puff by mouth daily.  ibuprofen (MOTRIN) 200 MG tablet Take 200 mg by mouth every 6  (six) hours as needed  INCRUSE ELLIPTA 62.5 mcg/actuation DsDv inhalation unit TAKE 1 PUFF BY MOUTH EVERY DAY  lisinopriL (ZESTRIL) 5 MG tablet TAKE 0.5 TABLETS (2.5 MG TOTAL) BY MOUTH EVERY OTHER DAY.  metoprolol tartrate (LOPRESSOR) 25 MG tablet Take 25 mg by mouth once daily  pantoprazole (PROTONIX) 40 MG DR tablet Take 40 mg by mouth once daily  PARoxetine (PAXIL) 20 MG tablet Take 20 mg by mouth once daily  rivaroxaban (XARELTO) 20 mg tablet Take by mouth  ELIQUIS 5 mg tablet Take 5 mg by mouth 2 (two) times daily (Patient not taking: Reported on 11/28/2022)   Allergies: No Known Allergies   Review of Systems:  A comprehensive 14 point ROS was performed, reviewed by me today, and the pertinent orthopaedic findings are documented in the HPI.  Physical Exam: BP 118/78  Ht 182.9 cm (6')  Wt 72.3 kg (159 lb 6.4 oz)  BMI 21.62 kg/m  General/Constitutional: The patient appears to be well-nourished, well-developed, and in no acute distress. Neuro/Psych: Normal mood and affect, oriented to person, place and time. Eyes: Non-icteric. Pupils are equal, round, and reactive to light, and exhibit synchronous movement. ENT: Unremarkable. Lymphatic: No palpable adenopathy. Respiratory: Lungs clear to auscultation, Normal chest excursion, No wheezes, and Non-labored breathing Cardiovascular: Regular rate and rhythm. No murmurs. and No edema, swelling or tenderness, except as noted in detailed exam. Integumentary: No impressive skin lesions present, except as noted in detailed exam. Musculoskeletal: Unremarkable, except as noted in detailed exam.  Left shoulder exam: SKIN: normal SWELLING: none WARMTH: none LYMPH NODES: no adenopathy palpable CREPITUS: none TENDERNESS:  Mildly tender along anterolateral shoulder ROM (active):  Forward flexion: 50 degrees Abduction: 40 degrees Internal rotation: L4 ROM (passive):  Forward flexion: 145 degrees Abduction: 135 degrees  ER/IR at 90 abd: 85  degrees / 55 degrees  He has moderate pain with active attempted active forward flexion and abduction, and mild pain with all other motions.  STRENGTH: Forward flexion: 2/5 Abduction: 2/5 External rotation: 4/5 Internal rotation: 4+/5 Pain with RC testing: Mild pain with attempted resisted forward flexion and abduction  STABILITY: Normal  SPECIAL TESTS: Luan Pulling' test: positive, mild Speed's test: Not evaluated Capsulitis - pain w/ passive ER: no Crossed arm test: Minimally positive Crank: Not evaluated Anterior apprehension: Negative Posterior apprehension: Not evaluated  He is neurovascularly intact to the left upper extremity.  Imaging:  Shoulder X-Ray Imaging: True AP, Y-scapular, and axillary views of the left shoulder are obtained. These films demonstrate mild degenerative changes as manifest by mild joint space narrowing inferiorly, best seen on the AP view. The subacromial space is well-maintained. There is no subacromial or infra-clavicular spurring. He demonstrates a Type II acromion.  Left Shoulder Imaging, MRI: MRI Shoulder Cartilage: Partial thickness humeral head cartilage loss. Partial thickness glenoid cartilage loss. MRI Shoulder Rotator Cuff: Full thickness tear of the supraspinatus. Retracted to the humeral head. MRI Shoulder Labrum / Biceps: Biceps tendinopathy. MRI Shoulder Bone: Normal bone.  Impression: 1. Tendinitis of upper biceps tendon of left shoulder.  2. Rotator cuff tendinitis, left. 3. Nontraumatic complete tear of left rotator cuff.  Plan:  1. Treatment options were discussed today with the patient. 2. The patient is scheduled for a left show arthroscopy with debridement, decompression, rotator cuff repair and probable biceps tenodesis with Dr. Roland Rack on 12/07/2022. 3. After discussion of the risk and benefits of surgery with the patient at today's visit he would like to proceed with surgery at this time. 4. The patient was instructed to stop  taking his Xarelto on 12/04/2022, 3 days prior to surgery. 5. This document will serve as a surgical history and physical for the patient. 6. The patient will follow-up per standard postop protocol. They can call the clinic they have any questions, new symptoms develop or symptoms worsen.  The procedure was discussed with the patient, as were the potential risks (including bleeding, infection, nerve and/or blood vessel injury, persistent or recurrent pain, failure of the repair, progression of arthritis, need for further surgery, blood clots, strokes, heart attacks and/or arhythmias, pneumonia, etc.) and benefits. The patient states his understanding and wishes to proceed.    H&P reviewed and patient re-examined. No changes.

## 2022-12-07 NOTE — Anesthesia Procedure Notes (Signed)
Anesthesia Regional Block: Interscalene brachial plexus block   Pre-Anesthetic Checklist: , timeout performed,  Correct Patient, Correct Site, Correct Laterality,  Correct Procedure, Correct Position, site marked,  Risks and benefits discussed,  Surgical consent,  Pre-op evaluation,  At surgeon's request and post-op pain management  Laterality: Left and Upper  Prep: chloraprep       Needles:  Injection technique: Single-shot  Needle Type: Stimiplex     Needle Length: 5cm  Needle Gauge: 22     Additional Needles:   Procedures:,,,, ultrasound used (permanent image in chart),,    Narrative:  Start time: 12/07/2022 7:43 AM End time: 12/07/2022 7:45 AM Injection made incrementally with aspirations every 5 mL.  Performed by: Personally  Anesthesiologist: Martha Clan, MD  Additional Notes: Functioning IV was confirmed and monitors were applied.  A 56m 22ga Stimuplex needle was used. Sterile prep and drape,hand hygiene and sterile gloves were used.  Negative aspiration and negative test dose prior to incremental administration of local anesthetic. The patient tolerated the procedure well.

## 2022-12-07 NOTE — Anesthesia Postprocedure Evaluation (Signed)
Anesthesia Post Note  Patient: Ronald Lowe  Procedure(s) Performed: SHOULDER ARTHROSCOPY WITH DEBRIDEMENT, DECOMPRESSION, ROTATOR CUFF REPAIR AND BICEPS TENODESIS.- RNFA (Left: Shoulder)  Patient location during evaluation: PACU Anesthesia Type: General and Regional Level of consciousness: awake and alert Pain management: pain level controlled Vital Signs Assessment: post-procedure vital signs reviewed and stable Respiratory status: spontaneous breathing, nonlabored ventilation, respiratory function stable and patient connected to nasal cannula oxygen Cardiovascular status: blood pressure returned to baseline and stable Postop Assessment: no apparent nausea or vomiting Anesthetic complications: no   No notable events documented.   Last Vitals:  Vitals:   12/07/22 1010 12/07/22 1019  BP: (!) 144/100 (!) 136/94  Pulse: 87 89  Resp: 17 15  Temp:  36.7 C  SpO2: 96% 96%    Last Pain:  Vitals:   12/07/22 1019  TempSrc: Temporal  PainSc: 0-No pain                 Martha Clan

## 2022-12-07 NOTE — Anesthesia Preprocedure Evaluation (Signed)
Anesthesia Evaluation  Patient identified by MRN, date of birth, ID band Patient awake    Reviewed: Allergy & Precautions, H&P , NPO status , Patient's Chart, lab work & pertinent test results, reviewed documented beta blocker date and time   History of Anesthesia Complications Negative for: history of anesthetic complications  Airway Mallampati: III  TM Distance: >3 FB Neck ROM: full    Dental  (+) Edentulous Upper, Edentulous Lower   Pulmonary shortness of breath and with exertion, COPD,  COPD inhaler, neg recent URI, former smoker   Pulmonary exam normal breath sounds clear to auscultation       Cardiovascular Exercise Tolerance: Good hypertension, (-) angina + CAD  (-) Past MI and (-) Cardiac Stents + dysrhythmias Atrial Fibrillation (-) Valvular Problems/Murmurs Rhythm:regular Rate:Normal     Neuro/Psych negative neurological ROS  negative psych ROS   GI/Hepatic ,GERD  ,,(+)     substance abuse  alcohol use  Endo/Other  negative endocrine ROS    Renal/GU CRFRenal disease  negative genitourinary   Musculoskeletal   Abdominal   Peds  Hematology negative hematology ROS (+)   Anesthesia Other Findings Past Medical History: No date: Aortic atherosclerosis (HCC) No date: Cervical spinal stenosis No date: Chronic kidney disease No date: COPD (chronic obstructive pulmonary disease) (HCC) No date: Coronary artery calcification seen on CT scan No date: ETOH abuse     Comment:  a.) 28+ standard drinks/week No date: GERD (gastroesophageal reflux disease) No date: History of amiodarone therapy     Comment:  a.) in the past; self discontinued No date: History of medication noncompliance     Comment:  a.) reported 10/2022 that he has been off NOAC for               several months secondary to vertiginous symptoms, not               acting like himself, and depression; b.) off/not taking               as prescribed:  antiarrhythmics, antihypertensives,               statins, and MDIs No date: Hyperlipidemia No date: Hypertension No date: Long term current use of anticoagulant     Comment:  a.) rivaroxaban No date: PAF (paroxysmal atrial fibrillation) ([facility])     Comment:  a.) CHA2DS2VASc = 3 (age, HTN, vascular disease               history);  b.) rate/rhythm maintained on oral carvediolol              (self discontinued diltiazem + amiodarone + metoprolol);               chronically anticoagulated with rivaroxaban (switched               from apixaban 11/17/2022) No date: Tobacco abuse   Reproductive/Obstetrics negative OB ROS                             Anesthesia Physical Anesthesia Plan  ASA: 3  Anesthesia Plan: General   Post-op Pain Management: Regional block*   Induction: Intravenous  PONV Risk Score and Plan: 2 and Ondansetron, Dexamethasone, Treatment may vary due to age or medical condition and Midazolam  Airway Management Planned: Oral ETT  Additional Equipment:   Intra-op Plan:   Post-operative Plan: Extubation in OR  Informed Consent: I have reviewed the  patients History and Physical, chart, labs and discussed the procedure including the risks, benefits and alternatives for the proposed anesthesia with the patient or authorized representative who has indicated his/her understanding and acceptance.     Dental Advisory Given  Plan Discussed with: Anesthesiologist, CRNA and Surgeon  Anesthesia Plan Comments:         Anesthesia Quick Evaluation

## 2022-12-07 NOTE — Op Note (Addendum)
12/07/2022  9:41 AM  Patient:   Ronald Lowe  Pre-Op Diagnosis:   Impingement/tendinopathy with near full-thickness rotator cuff tear, left shoulder  Post-Op Diagnosis:   Impingement/tendinopathy with near full-thickness rotator cuff tear and biceps tendinopathy, left shoulder.  Procedure:   Limited arthroscopic debridement, arthroscopic subacromial decompression, mini-open rotator cuff repair, and mini-open biceps tenodesis, left shoulder.  Anesthesia:   General endotracheal with interscalene block using Exparel placed preoperatively by the anesthesiologist.  Surgeon:   Pascal Lux, MD  Assistant:   Dorian Pod, RN  Findings:   As above. There was a near full-thickness tear involving the articular surface of the anterior and middle thirds of the supraspinatus tendon, compromising approximately 75-80% of the footprint. There also was evidence of mild superficial articular surface tearing of the midportion of the subscapularis tendon without compromise of the footprint. The remainder of the rotator cuff was in satisfactory condition. There was mild labral fraying anteriorly and superiorly without frank detachment from the glenoid rim. The articular surfaces of the glenoid and both were in satisfactory condition.  Complications:   None  Fluids:   500 cc  Estimated blood loss:   5 cc  Tourniquet time:   None  Drains:   None  Closure:   Staples      Brief clinical note:   The patient is a 66 year old male with a history of left shoulder pain and weakness. The patient's symptoms have progressed despite medications, activity modification, etc. The patient's history and examination are consistent with impingement/tendinopathy with a rotator cuff tear. These findings were confirmed by MRI scan. The patient presents at this time for definitive management of these shoulder symptoms.  Procedure:   The patient underwent placement of an interscalene block using Exparel by the  anesthesiologist in the preoperative holding area before being brought into the operating room and lain in the supine position. The patient then underwent general endotracheal intubation and anesthesia before being repositioned in the beach chair position using the beach chair positioner. The left shoulder and upper extremity were prepped with ChloraPrep solution before being draped sterilely. Preoperative antibiotics were administered. A timeout was performed to confirm the proper surgical site before the expected portal sites and incision site were injected with 0.5% Sensorcaine with epinephrine.   A posterior portal was created and the glenohumeral joint thoroughly inspected with the findings as described above. An anterior portal was created using an outside-in technique. The labrum and rotator cuff were further probed, again confirming the above-noted findings. The areas of labral fraying were debrided back to stable margins using the full-radius resector, as were the torn margins of the rotator cuff. The ArthroCare wand was inserted and used to release the biceps tendon from its labral anchor.  It also was used to obtain hemostasis as well as to "anneal" the labrum superiorly and anteriorly. The instruments were removed from the joint after suctioning the excess fluid.  The camera was repositioned through the posterior portal into the subacromial space. A separate lateral portal was created using an outside-in technique. The 3.5 mm full-radius resector was introduced and used to perform a subtotal bursectomy. The ArthroCare wand was then inserted and used to remove the periosteal tissue off the undersurface of the anterior third of the acromion as well as to recess the coracoacromial ligament from its attachment along the anterior and lateral margins of the acromion. The 4.0 mm acromionizing bur was introduced and used to complete the decompression by removing the undersurface of the  anterior third of the  acromion. The full radius resector was reintroduced to remove any residual bony debris before the ArthroCare wand was reintroduced to obtain hemostasis. The instruments were then removed from the subacromial space after suctioning the excess fluid.  An approximately 4-5 cm incision was made over the anterolateral aspect of the shoulder beginning at the anterolateral corner of the acromion and extending distally in line with the bicipital groove. This incision was carried down through the subcutaneous tissues to expose the deltoid fascia. The raphae between the anterior and middle thirds was identified and this plane developed to provide access into the subacromial space. Additional bursal tissues were debrided sharply using Metzenbaum scissors. The rotator cuff tear was readily identified. The rotator cuff tear was completed and its margins debrided sharply with a #15 blade before the exposed greater tuberosity was roughened with a rongeur. The tear was repaired using two [employer] 2.9 mm Q-Fix anchors. These sutures were then brought back laterally and secured using two [place] knotless RegeneSorb anchors to create a two-layer closure. A single #0 Ethibond interrupted suture was used to close the small longitudinal portion of the tear anteriorly. An apparent watertight closure was obtained.  The bicipital groove was identified by palpation and opened for 1-1.5 cm. The biceps tendon stump was retrieved through this defect. The floor of the bicipital groove was roughened with a curet before a Biomet 2.9 mm JuggerKnot anchor was inserted. Both sets of sutures were passed through the biceps tendon and tied securely to effect the tenodesis. The bicipital sheath was reapproximated using two #0 Ethibond interrupted sutures, incorporating the biceps tendon to further reinforce the tenodesis.  The wound was copiously irrigated with sterile saline solution before the deltoid raphae was  reapproximated using 2-0 Vicryl interrupted sutures. The subcutaneous tissues were closed in two layers using 2-0 Vicryl interrupted sutures before the skin was closed using staples. The portal sites also were closed using staples. A sterile bulky dressing was applied to the shoulder before the arm was placed into a shoulder immobilizer. The patient was then awakened, extubated, and returned to the recovery room in satisfactory condition after tolerating the procedure well.

## 2022-12-14 NOTE — Progress Notes (Signed)
This would be considered to be a chronic rotator cuff tear.  Thanks!

## 2022-12-27 NOTE — Progress Notes (Deleted)
Office Visit    Patient Name: Ronald Lowe Date of Encounter: 12/27/2022  PCP:  Dorna Mai, [place]  Cardiologist:  Jenkins Rouge, MD  Advanced Practice Provider:  No care team member to display Electrophysiologist:  None   HPI    Ronald Lowe is a 67 y.o. male with a past medical history significant for atrial fibrillation, smoking, HTN, HLD, and CRF presents today for F/U Has compliance issues with meds Stopped eliquis and lopressor as it made him feel poorly Changed to Xarelto and coreg by Pa 11/17/22 He has had issues with PAF from ETOH withdrawal Smoker with COPD as well  Baseline creatinine 1.9.  TTE 06/18/21 with EF 50 to 55% with trivial MR, mild LAE.     12/07/22 had left rotator cuff surgery Dr Roland Rack   ***   Past Medical History    Past Medical History:  Diagnosis Date   Aortic atherosclerosis ([place])    Cervical spinal stenosis    Chronic kidney disease    COPD (chronic obstructive pulmonary disease) (HCC)    Coronary artery calcification seen on CT scan    ETOH abuse    a.) 28+ standard drinks/week   GERD (gastroesophageal reflux disease)    History of amiodarone therapy    a.) in the past; self discontinued   History of medication noncompliance    a.) reported 10/2022 that he has been off NOAC for several months secondary to vertiginous symptoms, not acting like himself, and depression; b.) off/not taking as prescribed: antiarrhythmics, antihypertensives, statins, and MDIs   Hyperlipidemia    Hypertension    Long term current use of anticoagulant    a.) rivaroxaban   PAF (paroxysmal atrial fibrillation) ([place])    a.) CHA2DS2VASc = 3 (age, HTN, vascular disease history);  b.) rate/rhythm maintained on oral carvediolol (self discontinued diltiazem + amiodarone + metoprolol); chronically anticoagulated with rivaroxaban (switched from apixaban 11/17/2022)   Tobacco abuse    Past Surgical History:  Procedure Laterality Date    SHOULDER ARTHROSCOPY WITH SUBACROMIAL DECOMPRESSION, ROTATOR CUFF REPAIR AND BICEP TENDON REPAIR Left 12/07/2022   Procedure: SHOULDER ARTHROSCOPY WITH DEBRIDEMENT, DECOMPRESSION, ROTATOR CUFF REPAIR AND BICEPS TENODESIS.- RNFA;  Surgeon: Corky Mull, MD;  Location: [facility] ORS;  Service: Orthopedics;  Laterality: Left;   TONSILLECTOMY      Allergies  No Known Allergies  EKGs/Labs/Other Studies Reviewed:   The following studies were reviewed today:  Echocardiogram 06/18/2021 IMPRESSIONS     1. Left ventricular ejection fraction, by estimation, is 50 to 55%. The  left ventricle has low normal function. The left ventricle has no regional  wall motion abnormalities. Left ventricular diastolic parameters are  consistent with Grade I diastolic  dysfunction (impaired relaxation).   2. Right ventricular systolic function is normal. The right ventricular  size is normal.   3. The mitral valve was not well visualized. Trivial mitral valve  regurgitation.   4. The aortic valve was not well visualized. Aortic valve regurgitation  is trivial.   FINDINGS   Left Ventricle: Left ventricular ejection fraction, by estimation, is 50  to 55%. The left ventricle has low normal function. The left ventricle has  no regional wall motion abnormalities. The left ventricular internal  cavity size was normal in size.  There is no left ventricular hypertrophy. Left ventricular diastolic  parameters are consistent with Grade I diastolic dysfunction (impaired  relaxation). Indeterminate filling pressures.   Right Ventricle: The right ventricular  size is normal. Right vetricular  wall thickness was not well visualized. Right ventricular systolic  function is normal.   Left Atrium: Left atrial size was normal in size.   Right Atrium: Right atrial size was normal in size.   Pericardium: Trivial pericardial effusion is present.   Mitral Valve: The mitral valve was not well visualized. Trivial mitral   valve regurgitation.   Tricuspid Valve: The tricuspid valve is grossly normal. Tricuspid valve  regurgitation is trivial.   Aortic Valve: The aortic valve was not well visualized. Aortic valve  regurgitation is trivial.   Pulmonic Valve: The pulmonic valve was grossly normal. Pulmonic valve  regurgitation is not visualized.   Aorta: The aortic root and ascending aorta are structurally normal, with  no evidence of dilitation.   IAS/Shunts: The atrial septum is grossly normal.   EKG:  EKG is  ordered today.  The ekg ordered today demonstrates sinus tachycardia, rate 102 bpm  Recent Labs: 07/01/2022: ALT 33; TSH 0.872 11/07/2022: BUN 14; Creatinine, Ser 1.04; Hemoglobin 17.0; Platelets 223; Potassium 3.9; Sodium 139  Recent Lipid Panel    Component Value Date/Time   CHOL 137 07/01/2022 1039   TRIG 67 07/01/2022 1039   HDL 69 07/01/2022 1039   CHOLHDL 2.0 07/01/2022 1039   CHOLHDL 3.0 Ratio 03/22/2010 1954   VLDL 12 03/22/2010 1954   LDLCALC 54 07/01/2022 1039    Risk Assessment/Calculations:   CHA2DS2-VASc Score =     This indicates a  % annual risk of stroke. The patient's score is based upon:     {This patient has a significant risk of stroke if diagnosed with atrial fibrillation.  Please consider VKA or DOAC agent for anticoagulation if the bleeding risk is acceptable.   You can also use the SmartPhrase .[place] for documentation.   [id number]  Home Medications   No outpatient medications have been marked as taking for the 01/02/23 encounter (Appointment) with Josue Hector, MD.     Review of Systems      All other systems reviewed and are otherwise negative except as noted above.  Physical Exam    VS:  There were no vitals taken for this visit. , BMI There is no height or weight on file to calculate BMI.  Wt Readings from Last 3 Encounters:  12/07/22 158 lb 1.1 oz (71.7 kg)  12/06/22 158 lb (71.7 kg)  12/01/22 162 lb (73.5 kg)     GEN: Well  nourished, well developed, in no acute distress. HEENT: normal. Neck: Supple, no JVD, carotid bruits, or masses. Cardiac: sinus tachycardia, no murmurs, rubs, or gallops. No clubbing, cyanosis, edema.  Radials/PT 2+ and equal bilaterally.  Respiratory:  Respirations regular and unlabored, clear to auscultation bilaterally. GI: Soft, nontender, nondistended. MS: No deformity or atrophy. Skin: Warm and dry, no rash. Neuro:  Strength and sensation are intact. Psych: Normal affect.  Assessment & Plan    PAF -On coreg and xarelto ***  Smoking -cessation advised - lung cancer CT negative 07/18/22  - F/U Dr Shearon Stalls  - Continue Trelegy Ellipta and Ventolin   Hypertension -much better controlled   -continue current medication regimen  Hyperlipidemia -continue lipitor -LDL 54 and at goal  6. EtOH abuse -counseled previously  7. Ortho - post left rotator cuff surgery 12/07/22  Dr Roland Rack - PT/OT      Disposition: Follow up in a year .  Signed, Jenkins Rouge, MD 12/27/2022, 10:13 AM [place]

## 2023-01-02 ENCOUNTER — Ambulatory Visit: Payer: Medicare HMO | Admitting: Cardiovascular Disease

## 2023-01-03 ENCOUNTER — Ambulatory Visit: Payer: Self-pay

## 2023-01-03 NOTE — Patient Instructions (Signed)
Visit Information  Thank you for taking time to visit with me today. Please don't hesitate to contact me if I can be of assistance to you.   Following are the goals we discussed today:   Goals Addressed             This Visit's Progress    Hypertension Management       Care Coordination Interventions: Evaluation of current treatment plan related to hypertension self management and patient's adherence to plan as established by provider Reviewed medications with patient and discussed importance of compliance Discussed plans with patient for ongoing care management follow up and provided patient with direct contact information for care management team  Patient blood pressure last check 126/82  Recent left rotator cuff surgery.  Patient reports doing well and had recent follow up visit to get staples out.           Our next appointment is by telephone on 03/25/23 at 1000  Please call the care guide team at [phone] if you need to cancel or reschedule your appointment.   If you are experiencing a Mental Health or [place] or need someone to talk to, please call the Suicide and Crisis Lifeline: 988   Patient verbalizes understanding of instructions and care plan provided today and agrees to view in [place]. Active MyChart status and patient understanding of how to access instructions and care plan via MyChart confirmed with patient.     Telephone follow up appointment with care management team member scheduled for: April  Dustin Bumbaugh J Chet Greenley, [place], MSN [place] Management Care Management Coordinator Direct Line [phone]

## 2023-01-03 NOTE — Patient Outreach (Signed)
  Care Coordination   Follow Up Visit Note   01/03/2023 Name: Ronald Lowe MRN: [id number] DOB: 1956-05-15  Ronald Lowe is a 67 y.o. year old male who sees Ronald Mai, MD for primary care. I spoke with  Ronald Lowe by phone today.  What matters to the patients health and wellness today?  Recovery from left shoulder surgery    Goals Addressed             This Visit's Progress    Hypertension Management       Care Coordination Interventions: Evaluation of current treatment plan related to hypertension self management and patient's adherence to plan as established by provider Reviewed medications with patient and discussed importance of compliance Discussed plans with patient for ongoing care management follow up and provided patient with direct contact information for care management team  Patient blood pressure last check 126/82  Recent left rotator cuff surgery.  Patient reports doing well and had recent follow up visit to get staples out.           SDOH assessments and interventions completed:  Yes     Care Coordination Interventions:  Yes, provided   Follow up plan: Follow up call scheduled for April    Encounter Outcome:  Pt. Visit Completed   Ronald Baseman, RN, MSN [place] Management Care Management Coordinator Direct Line [phone]

## 2023-01-13 DIAGNOSIS — Z1211 Encounter for screening for malignant neoplasm of colon: Secondary | ICD-10-CM | POA: Diagnosis not present

## 2023-01-20 ENCOUNTER — Other Ambulatory Visit: Payer: Self-pay | Admitting: Family Medicine

## 2023-01-20 DIAGNOSIS — R195 Other fecal abnormalities: Secondary | ICD-10-CM

## 2023-01-20 LAB — COLOGUARD: COLOGUARD: POSITIVE — AB

## 2023-01-30 ENCOUNTER — Other Ambulatory Visit: Payer: Self-pay | Admitting: Surgery

## 2023-01-30 DIAGNOSIS — W010XXD Fall on same level from slipping, tripping and stumbling without subsequent striking against object, subsequent encounter: Secondary | ICD-10-CM | POA: Diagnosis not present

## 2023-01-30 DIAGNOSIS — S46012A Strain of muscle(s) and tendon(s) of the rotator cuff of left shoulder, initial encounter: Secondary | ICD-10-CM | POA: Diagnosis not present

## 2023-01-30 DIAGNOSIS — M7552 Bursitis of left shoulder: Secondary | ICD-10-CM | POA: Diagnosis not present

## 2023-01-31 ENCOUNTER — Encounter
Admission: RE | Admit: 2023-01-31 | Discharge: 2023-01-31 | Disposition: A | Discharge status: Home / Self Care | Payer: Medicare HMO | Source: Ambulatory Visit | Attending: Surgery | Admitting: Surgery

## 2023-01-31 VITALS — Ht 72.0 in | Wt 154.0 lb

## 2023-01-31 DIAGNOSIS — Z01812 Encounter for preprocedural laboratory examination: Secondary | ICD-10-CM

## 2023-01-31 NOTE — Patient Instructions (Addendum)
Your procedure is scheduled on: Thursday, February 15 Report to the Registration Desk on the 1st floor of the [facility]. To find out your arrival time, please call [phone] between 1PM - 3PM on: Wednesday, February 14 If your arrival time is 6:00 am, do not arrive before that time as the [place] entrance doors do not open until 6:00 am.  REMEMBER: Instructions that are not followed completely may result in serious medical risk, up to and including death; or upon the discretion of your surgeon and anesthesiologist your surgery may need to be rescheduled.  Do not eat food after midnight the night before surgery.  No gum chewing or hard candies.  You may however, drink CLEAR liquids up to 2 hours before you are scheduled to arrive for your surgery. Do not drink anything within 2 hours of your scheduled arrival time.  Clear liquids include: - water  - apple juice without pulp - gatorade (not RED colors) - black coffee or tea (Do NOT add milk or creamers to the coffee or tea) Do NOT drink anything that is not on this list.  In addition, your doctor has ordered for you to drink the provided:  Ensure Pre-Surgery Clear Carbohydrate Drink  Drinking this carbohydrate drink up to two hours before surgery helps to reduce insulin resistance and improve patient outcomes. Please complete drinking 2 hours before scheduled arrival time.  One week prior to surgery: starting today, February 13 Stop Anti-inflammatories (NSAIDS) such as Advil, Aleve, Ibuprofen, Motrin, Naproxen, Naprosyn and Aspirin based products such as Excedrin, Goody's Powder, BC Powder. Stop ANY OVER THE COUNTER supplements until after surgery. You may however, continue to take Tylenol if needed for pain up until the day of surgery.  Continue taking all prescribed medications with the exception of the following:  Since you stated that you do not currently take Xarelto; do not start taking xarelto until after surgery.  Please discuss with your cardiologist about taking xarelto.  TAKE ONLY THESE MEDICATIONS THE MORNING OF SURGERY WITH A SIP OF WATER:  Carvedilol Pantoprazole (Protonix) - (take one the night before and one on the morning of surgery - helps to prevent nausea after surgery.) Trelegy inhaler  Use inhalers on the day of surgery and bring your albuterol inhaler to the hospital.  No Alcohol for 24 hours before or after surgery.  No Smoking including e-cigarettes for 24 hours before surgery.  No chewable tobacco products for at least 6 hours before surgery.  No nicotine patches on the day of surgery.  Do not use any "recreational" drugs for at least a week (preferably 2 weeks) before your surgery.  Please be advised that the combination of cocaine and anesthesia may have negative outcomes, up to and including death. If you test positive for cocaine, your surgery will be cancelled.  On the morning of surgery brush your teeth with toothpaste and water, you may rinse your mouth with mouthwash if you wish. Do not swallow any toothpaste or mouthwash.  Use CHG Soap as directed on instruction sheet.  Do not wear jewelry, make-up, hairpins, clips or nail polish.  Do not wear lotions, powders, or perfumes.   Do not shave body hair from the neck down 48 hours before surgery.  Contact lenses, hearing aids and dentures may not be worn into surgery.  Do not bring valuables to the hospital. [facility] is not responsible for any missing/lost belongings or valuables.   Notify your doctor if there is any change in  your medical condition (cold, fever, infection).  Wear comfortable clothing (specific to your surgery type) to the hospital.  After surgery, you can help prevent lung complications by doing breathing exercises.  Take deep breaths and cough every 1-2 hours. Your doctor may order a device called an Incentive Spirometer to help you take deep breaths.  If you are being discharged the day of  surgery, you will not be allowed to drive home. You will need a responsible individual to drive you home and stay with you for 24 hours after surgery.   If you are taking public transportation, you will need to have a responsible individual with you.  Please call the [place] Dept. at [phone] if you have any questions about these instructions.  Surgery Visitation Policy:  Patients undergoing a surgery or procedure may have two family members or support persons with them as long as the person is not COVID-19 positive or experiencing its symptoms.      Preparing for Surgery with CHLORHEXIDINE GLUCONATE (CHG) Soap  Chlorhexidine Gluconate (CHG) Soap  o An antiseptic cleaner that kills germs and bonds with the skin to continue killing germs even after washing  o Used for showering the night before surgery and morning of surgery  Before surgery, you can play an important role by reducing the number of germs on your skin.  CHG (Chlorhexidine gluconate) soap is an antiseptic cleanser which kills germs and bonds with the skin to continue killing germs even after washing.  Please do not use if you have an allergy to CHG or antibacterial soaps. If your skin becomes reddened/irritated stop using the CHG.  1. Shower the NIGHT BEFORE SURGERY and the MORNING OF SURGERY with CHG soap.  2. If you choose to wash your hair, wash your hair first as usual with your normal shampoo.  3. After shampooing, rinse your hair and body thoroughly to remove the shampoo.  4. Use CHG as you would any other liquid soap. You can apply CHG directly to the skin and wash gently with a scrungie or a clean washcloth.  5. Apply the CHG soap to your body only from the neck down. Do not use on open wounds or open sores. Avoid contact with your eyes, ears, mouth, and genitals (private parts). Wash face and genitals (private parts) with your normal soap.  6. Wash thoroughly, paying special attention to  the area where your surgery will be performed.  7. Thoroughly rinse your body with warm water.  8. Do not shower/wash with your normal soap after using and rinsing off the CHG soap.  9. Pat yourself dry with a clean towel.  10. Wear clean pajamas to bed the night before surgery.  12. Place clean sheets on your bed the night of your first shower and do not sleep with pets.  13. Shower again with the CHG soap on the day of surgery prior to arriving at the hospital.  14. Do not apply any deodorants/lotions/powders.  15. Please wear clean clothes to the hospital.

## 2023-02-01 MED ORDER — CEFAZOLIN SODIUM-DEXTROSE 2-4 GM/100ML-% IV SOLN
2.0000 g | INTRAVENOUS | Status: AC
  Administered 2023-02-02: 2 g via INTRAVENOUS

## 2023-02-01 MED ORDER — ORAL CARE MOUTH RINSE: 15.0000 mL | Freq: Once | OROMUCOSAL | Status: AC

## 2023-02-01 MED ORDER — CHLORHEXIDINE GLUCONATE 0.12 % MT SOLN: 15.0000 mL | Freq: Once | OROMUCOSAL | Status: AC

## 2023-02-01 MED ORDER — LACTATED RINGERS IV SOLN: INTRAVENOUS | Status: DC

## 2023-02-02 ENCOUNTER — Encounter: Payer: Self-pay | Admitting: Surgery

## 2023-02-02 ENCOUNTER — Ambulatory Visit: Payer: Medicare HMO

## 2023-02-02 ENCOUNTER — Ambulatory Visit
Admission: RE | Admit: 2023-02-02 | Discharge: 2023-02-02 | Disposition: A | Discharge status: Home / Self Care | Payer: Medicare HMO | Attending: Surgery | Admitting: Surgery

## 2023-02-02 ENCOUNTER — Ambulatory Visit: Payer: Medicare HMO | Admitting: Certified Registered"

## 2023-02-02 ENCOUNTER — Encounter
Admission: RE | Disposition: A | Discharge status: Home / Self Care | Payer: Self-pay | Source: Home / Self Care | Attending: Surgery

## 2023-02-02 ENCOUNTER — Other Ambulatory Visit: Payer: Self-pay

## 2023-02-02 DIAGNOSIS — I700 Atherosclerosis of aorta: Secondary | ICD-10-CM | POA: Diagnosis not present

## 2023-02-02 DIAGNOSIS — M4802 Spinal stenosis, cervical region: Secondary | ICD-10-CM | POA: Diagnosis not present

## 2023-02-02 DIAGNOSIS — K219 Gastro-esophageal reflux disease without esophagitis: Secondary | ICD-10-CM | POA: Diagnosis not present

## 2023-02-02 DIAGNOSIS — S46012A Strain of muscle(s) and tendon(s) of the rotator cuff of left shoulder, initial encounter: Secondary | ICD-10-CM | POA: Diagnosis not present

## 2023-02-02 DIAGNOSIS — E785 Hyperlipidemia, unspecified: Secondary | ICD-10-CM | POA: Insufficient documentation

## 2023-02-02 DIAGNOSIS — Z7901 Long term (current) use of anticoagulants: Secondary | ICD-10-CM | POA: Diagnosis not present

## 2023-02-02 DIAGNOSIS — R0602 Shortness of breath: Secondary | ICD-10-CM | POA: Diagnosis not present

## 2023-02-02 DIAGNOSIS — I129 Hypertensive chronic kidney disease with stage 1 through stage 4 chronic kidney disease, or unspecified chronic kidney disease: Secondary | ICD-10-CM | POA: Diagnosis not present

## 2023-02-02 DIAGNOSIS — F1411 Cocaine abuse, in remission: Secondary | ICD-10-CM | POA: Insufficient documentation

## 2023-02-02 DIAGNOSIS — J449 Chronic obstructive pulmonary disease, unspecified: Secondary | ICD-10-CM | POA: Insufficient documentation

## 2023-02-02 DIAGNOSIS — F17210 Nicotine dependence, cigarettes, uncomplicated: Secondary | ICD-10-CM | POA: Insufficient documentation

## 2023-02-02 DIAGNOSIS — I480 Paroxysmal atrial fibrillation: Secondary | ICD-10-CM | POA: Insufficient documentation

## 2023-02-02 DIAGNOSIS — M7582 Other shoulder lesions, left shoulder: Secondary | ICD-10-CM | POA: Diagnosis not present

## 2023-02-02 DIAGNOSIS — M75122 Complete rotator cuff tear or rupture of left shoulder, not specified as traumatic: Secondary | ICD-10-CM | POA: Diagnosis not present

## 2023-02-02 DIAGNOSIS — N189 Chronic kidney disease, unspecified: Secondary | ICD-10-CM | POA: Diagnosis not present

## 2023-02-02 DIAGNOSIS — M65812 Other synovitis and tenosynovitis, left shoulder: Secondary | ICD-10-CM | POA: Diagnosis not present

## 2023-02-02 DIAGNOSIS — Z9889 Other specified postprocedural states: Secondary | ICD-10-CM | POA: Insufficient documentation

## 2023-02-02 DIAGNOSIS — Z01812 Encounter for preprocedural laboratory examination: Secondary | ICD-10-CM

## 2023-02-02 DIAGNOSIS — W19XXXA Unspecified fall, initial encounter: Secondary | ICD-10-CM | POA: Insufficient documentation

## 2023-02-02 DIAGNOSIS — G8918 Other acute postprocedural pain: Secondary | ICD-10-CM | POA: Diagnosis not present

## 2023-02-02 DIAGNOSIS — M7522 Bicipital tendinitis, left shoulder: Secondary | ICD-10-CM | POA: Diagnosis not present

## 2023-02-02 HISTORY — PX: SHOULDER ARTHROSCOPY WITH SUBACROMIAL DECOMPRESSION, ROTATOR CUFF REPAIR AND BICEP TENDON REPAIR: SHX5687

## 2023-02-02 LAB — URINE DRUG SCREEN, QUALITATIVE (ARMC ONLY)
Amphetamines, Ur Screen: NOT DETECTED
Barbiturates, Ur Screen: NOT DETECTED
Benzodiazepine, Ur Scrn: NOT DETECTED
Cannabinoid 50 Ng, Ur ~~LOC~~: NOT DETECTED
Cocaine Metabolite,Ur ~~LOC~~: NOT DETECTED
MDMA (Ecstasy)Ur Screen: NOT DETECTED
Methadone Scn, Ur: NOT DETECTED
Opiate, Ur Screen: NOT DETECTED
Phencyclidine (PCP) Ur S: NOT DETECTED
Tricyclic, Ur Screen: NOT DETECTED

## 2023-02-02 SURGERY — SHOULDER ARTHROSCOPY WITH SUBACROMIAL DECOMPRESSION, ROTATOR CUFF REPAIR AND BICEP TENDON REPAIR
Anesthesia: General | Site: Shoulder | Laterality: Left

## 2023-02-02 MED ORDER — MIDAZOLAM HCL 2 MG/2ML IJ SOLN: 1.0000 mg | Freq: Once | INTRAMUSCULAR | Status: AC

## 2023-02-02 MED ORDER — LIDOCAINE HCL (PF) 1 % IJ SOLN
INTRAMUSCULAR | Status: AC
  Filled 2023-02-02: qty 5

## 2023-02-02 MED ORDER — CEFAZOLIN SODIUM-DEXTROSE 2-4 GM/100ML-% IV SOLN
INTRAVENOUS | Status: AC
  Filled 2023-02-02: qty 100

## 2023-02-02 MED ORDER — OXYCODONE HCL 5 MG PO TABS: 5.0000 mg | ORAL_TABLET | ORAL | Status: DC | PRN

## 2023-02-02 MED ORDER — BUPIVACAINE HCL (PF) 0.5 % IJ SOLN
INTRAMUSCULAR | Status: DC | PRN
  Administered 2023-02-02: 10 mL via PERINEURAL

## 2023-02-02 MED ORDER — FENTANYL CITRATE PF 50 MCG/ML IJ SOSY: 50.0000 ug | PREFILLED_SYRINGE | Freq: Once | INTRAMUSCULAR | Status: AC

## 2023-02-02 MED ORDER — PHENYLEPHRINE HCL-NACL 20-0.9 MG/250ML-% IV SOLN
INTRAVENOUS | Status: DC | PRN
  Administered 2023-02-02: 25 ug/min via INTRAVENOUS

## 2023-02-02 MED ORDER — ONDANSETRON HCL 4 MG/2ML IJ SOLN
INTRAMUSCULAR | Status: DC | PRN
  Administered 2023-02-02: 4 mg via INTRAVENOUS

## 2023-02-02 MED ORDER — OXYCODONE HCL 5 MG PO TABS: 5.0000 mg | ORAL_TABLET | ORAL | 0 refills | Status: DC | PRN

## 2023-02-02 MED ORDER — BUPIVACAINE LIPOSOME 1.3 % IJ SUSP
INTRAMUSCULAR | Status: AC
  Filled 2023-02-02: qty 20

## 2023-02-02 MED ORDER — ROCURONIUM BROMIDE 100 MG/10ML IV SOLN
INTRAVENOUS | Status: DC | PRN
  Administered 2023-02-02: 50 mg via INTRAVENOUS

## 2023-02-02 MED ORDER — SUGAMMADEX SODIUM 200 MG/2ML IV SOLN
INTRAVENOUS | Status: DC | PRN
  Administered 2023-02-02: 200 mg via INTRAVENOUS

## 2023-02-02 MED ORDER — DEXAMETHASONE SODIUM PHOSPHATE 10 MG/ML IJ SOLN
INTRAMUSCULAR | Status: AC
  Filled 2023-02-02: qty 1

## 2023-02-02 MED ORDER — KETOROLAC TROMETHAMINE 15 MG/ML IJ SOLN: 15.0000 mg | Freq: Once | INTRAMUSCULAR | Status: AC

## 2023-02-02 MED ORDER — PHENYLEPHRINE 80 MCG/ML (10ML) SYRINGE FOR IV PUSH (FOR BLOOD PRESSURE SUPPORT)
PREFILLED_SYRINGE | INTRAVENOUS | Status: AC
  Filled 2023-02-02: qty 10

## 2023-02-02 MED ORDER — KETOROLAC TROMETHAMINE 15 MG/ML IJ SOLN
INTRAMUSCULAR | Status: AC
  Administered 2023-02-02: 15 mg via INTRAVENOUS
  Filled 2023-02-02: qty 1

## 2023-02-02 MED ORDER — FENTANYL CITRATE (PF) 100 MCG/2ML IJ SOLN: 25.0000 ug | INTRAMUSCULAR | Status: DC | PRN

## 2023-02-02 MED ORDER — PHENYLEPHRINE HCL (PRESSORS) 10 MG/ML IV SOLN
INTRAVENOUS | Status: DC | PRN
  Administered 2023-02-02 (×3): 80 ug via INTRAVENOUS

## 2023-02-02 MED ORDER — ONDANSETRON HCL 4 MG/2ML IJ SOLN: 4.0000 mg | Freq: Once | INTRAMUSCULAR | Status: DC | PRN

## 2023-02-02 MED ORDER — METOCLOPRAMIDE HCL 5 MG/ML IJ SOLN: 5.0000 mg | Freq: Three times a day (TID) | INTRAMUSCULAR | Status: DC | PRN

## 2023-02-02 MED ORDER — DEXAMETHASONE SODIUM PHOSPHATE 10 MG/ML IJ SOLN
INTRAMUSCULAR | Status: DC | PRN
  Administered 2023-02-02: 10 mg via INTRAVENOUS

## 2023-02-02 MED ORDER — ONDANSETRON HCL 4 MG/2ML IJ SOLN
INTRAMUSCULAR | Status: AC
  Filled 2023-02-02: qty 2

## 2023-02-02 MED ORDER — LACTATED RINGERS IV SOLN
INTRAVENOUS | Status: DC | PRN
  Administered 2023-02-02: 1001 mL

## 2023-02-02 MED ORDER — SODIUM CHLORIDE 0.9 % IV SOLN: INTRAVENOUS | Status: DC

## 2023-02-02 MED ORDER — ROCURONIUM BROMIDE 10 MG/ML (PF) SYRINGE
PREFILLED_SYRINGE | INTRAVENOUS | Status: AC
  Filled 2023-02-02: qty 10

## 2023-02-02 MED ORDER — BUPIVACAINE LIPOSOME 1.3 % IJ SUSP
INTRAMUSCULAR | Status: DC | PRN
  Administered 2023-02-02: 20 mL via PERINEURAL

## 2023-02-02 MED ORDER — BUPIVACAINE HCL (PF) 0.5 % IJ SOLN
INTRAMUSCULAR | Status: AC
  Filled 2023-02-02: qty 30

## 2023-02-02 MED ORDER — HYDRALAZINE HCL 20 MG/ML IJ SOLN
INTRAMUSCULAR | Status: AC
  Filled 2023-02-02: qty 1

## 2023-02-02 MED ORDER — ONDANSETRON HCL 4 MG PO TABS: 4.0000 mg | ORAL_TABLET | Freq: Four times a day (QID) | ORAL | Status: DC | PRN

## 2023-02-02 MED ORDER — HYDRALAZINE HCL 20 MG/ML IJ SOLN
5.0000 mg | Freq: Once | INTRAMUSCULAR | Status: AC
  Administered 2023-02-02: 5 mg via INTRAVENOUS

## 2023-02-02 MED ORDER — ACETAMINOPHEN 325 MG PO TABS: 325.0000 mg | ORAL_TABLET | Freq: Four times a day (QID) | ORAL | Status: DC | PRN

## 2023-02-02 MED ORDER — FENTANYL CITRATE PF 50 MCG/ML IJ SOSY
PREFILLED_SYRINGE | INTRAMUSCULAR | Status: AC
  Administered 2023-02-02: 50 ug via INTRAVENOUS
  Filled 2023-02-02: qty 1

## 2023-02-02 MED ORDER — BUPIVACAINE-EPINEPHRINE 0.5% -1:200000 IJ SOLN
INTRAMUSCULAR | Status: DC | PRN
  Administered 2023-02-02: 30 mL

## 2023-02-02 MED ORDER — ONDANSETRON HCL 4 MG/2ML IJ SOLN: 4.0000 mg | Freq: Four times a day (QID) | INTRAMUSCULAR | Status: DC | PRN

## 2023-02-02 MED ORDER — MIDAZOLAM HCL 2 MG/2ML IJ SOLN
INTRAMUSCULAR | Status: AC
  Administered 2023-02-02: 1 mg via INTRAVENOUS
  Filled 2023-02-02: qty 2

## 2023-02-02 MED ORDER — EPINEPHRINE PF 1 MG/ML IJ SOLN
INTRAMUSCULAR | Status: AC
  Filled 2023-02-02: qty 2

## 2023-02-02 MED ORDER — LIDOCAINE HCL (PF) 1 % IJ SOLN
INTRAMUSCULAR | Status: DC | PRN
  Administered 2023-02-02: 4 mL via SUBCUTANEOUS

## 2023-02-02 MED ORDER — BUPIVACAINE HCL (PF) 0.5 % IJ SOLN
INTRAMUSCULAR | Status: AC
  Filled 2023-02-02: qty 10

## 2023-02-02 MED ORDER — CHLORHEXIDINE GLUCONATE 0.12 % MT SOLN
OROMUCOSAL | Status: AC
  Administered 2023-02-02: 15 mL via OROMUCOSAL
  Filled 2023-02-02: qty 15

## 2023-02-02 MED ORDER — METOCLOPRAMIDE HCL 10 MG PO TABS: 5.0000 mg | ORAL_TABLET | Freq: Three times a day (TID) | ORAL | Status: DC | PRN

## 2023-02-02 MED ORDER — PROPOFOL 10 MG/ML IV BOLUS
INTRAVENOUS | Status: DC | PRN
  Administered 2023-02-02: 150 mg via INTRAVENOUS

## 2023-02-02 SURGICAL SUPPLY — 54 items
ANCH SUT 5.5 KNTLS (Anchor) ×1 IMPLANT
ANCH SUT Q-FX 2.8 (Anchor) ×1 IMPLANT
ANCHOR ALL-SUT Q-FIX 2.8 (Anchor) IMPLANT
ANCHOR HEALICOIL REGEN 5.5 (Anchor) IMPLANT
APL PRP STRL LF DISP 70% ISPRP (MISCELLANEOUS) ×2
BIT DRILL JUGRKNT W/NDL BIT2.9 (DRILL) IMPLANT
BLADE FULL RADIUS 3.5 (BLADE) ×1 IMPLANT
BUR ACROMIONIZER 4.0 (BURR) ×1 IMPLANT
CANNULA SHAVER 8MMX76MM (CANNULA) ×1 IMPLANT
CHLORAPREP W/TINT 26 (MISCELLANEOUS) ×1 IMPLANT
COVER MAYO STAND REUSABLE (DRAPES) ×1 IMPLANT
DILATOR 5.5 THREADED HEALICOIL (MISCELLANEOUS) IMPLANT
DRILL JUGGERKNOT W/NDL BIT 2.9 (DRILL)
ELECT CAUTERY BLADE 6.4 (BLADE) ×1 IMPLANT
ELECT REM PT RETURN 9FT ADLT (ELECTROSURGICAL) ×1
ELECTRODE REM PT RTRN 9FT ADLT (ELECTROSURGICAL) ×1 IMPLANT
GAUZE SPONGE 4X4 12PLY STRL (GAUZE/BANDAGES/DRESSINGS) ×1 IMPLANT
GAUZE XEROFORM 1X8 LF (GAUZE/BANDAGES/DRESSINGS) ×1 IMPLANT
GLOVE BIO SURGEON STRL SZ7.5 (GLOVE) ×2 IMPLANT
GLOVE BIO SURGEON STRL SZ8 (GLOVE) ×2 IMPLANT
GLOVE BIOGEL PI IND STRL 8 (GLOVE) ×1 IMPLANT
GLOVE SURG UNDER LTX SZ8 (GLOVE) ×1 IMPLANT
GOWN STRL REUS W/ TWL LRG LVL3 (GOWN DISPOSABLE) ×1 IMPLANT
GOWN STRL REUS W/ TWL XL LVL3 (GOWN DISPOSABLE) ×1 IMPLANT
GOWN STRL REUS W/TWL LRG LVL3 (GOWN DISPOSABLE) ×1
GOWN STRL REUS W/TWL XL LVL3 (GOWN DISPOSABLE) ×1
GRASPER SUT 15 45D LOW PRO (SUTURE) IMPLANT
IV LACTATED RINGER IRRG 3000ML (IV SOLUTION) ×2
IV LR IRRIG 3000ML ARTHROMATIC (IV SOLUTION) ×2 IMPLANT
KIT CANNULA 8X76-LX IN CANNULA (CANNULA) ×1 IMPLANT
KIT SUTURE 2.8 Q-FIX DISP (MISCELLANEOUS) IMPLANT
MANIFOLD NEPTUNE II (INSTRUMENTS) ×2 IMPLANT
MASK FACE SPIDER DISP (MASK) ×1 IMPLANT
MAT ABSORB  FLUID 56X50 GRAY (MISCELLANEOUS) ×1
MAT ABSORB FLUID 56X50 GRAY (MISCELLANEOUS) ×1 IMPLANT
PACK ARTHROSCOPY SHOULDER (MISCELLANEOUS) ×1 IMPLANT
PAD ABD DERMACEA PRESS 5X9 (GAUZE/BANDAGES/DRESSINGS) ×2 IMPLANT
PASSER SUT FIRSTPASS SELF (INSTRUMENTS) IMPLANT
SLEEVE REMOTE CONTROL 5X12 (DRAPES) IMPLANT
SLING ARM LRG DEEP (SOFTGOODS) ×1 IMPLANT
SLING ULTRA II LG (MISCELLANEOUS) ×1 IMPLANT
SPONGE T-LAP 18X18 ~~LOC~~+RFID (SPONGE) ×1 IMPLANT
STAPLER SKIN PROX 35W (STAPLE) ×1 IMPLANT
STRAP SAFETY 5IN WIDE (MISCELLANEOUS) ×1 IMPLANT
SUT ETHIBOND 0 MO6 C/R (SUTURE) ×1 IMPLANT
SUT ULTRABRAID 2 COBRAID 38 (SUTURE) IMPLANT
SUT VIC AB 2-0 CT1 27 (SUTURE) ×2
SUT VIC AB 2-0 CT1 TAPERPNT 27 (SUTURE) ×2 IMPLANT
TAPE MICROFOAM 4IN (TAPE) ×1 IMPLANT
TRAP FLUID SMOKE EVACUATOR (MISCELLANEOUS) ×1 IMPLANT
TUBING CONNECTING 10 (TUBING) ×1 IMPLANT
TUBING INFLOW SET DBFLO PUMP (TUBING) ×1 IMPLANT
WAND WEREWOLF FLOW 90D (MISCELLANEOUS) ×1 IMPLANT
WATER STERILE IRR 500ML POUR (IV SOLUTION) ×1 IMPLANT

## 2023-02-02 NOTE — Anesthesia Procedure Notes (Signed)
Procedure Name: Intubation Date/Time: 02/02/2023 1:07 PM  Performed by: Chanetta Marshall, CRNAPre-anesthesia Checklist: Patient identified, Emergency Drugs available, Suction available and Patient being monitored Patient Re-evaluated:Patient Re-evaluated prior to induction Oxygen Delivery Method: Circle system utilized Preoxygenation: Pre-oxygenation with 100% oxygen Induction Type: IV induction Ventilation: Mask ventilation without difficulty Laryngoscope Size: McGraph and 3 Grade View: Grade I Tube type: Oral Tube size: 7.0 mm Number of attempts: 1 Airway Equipment and Method: Video-laryngoscopy Placement Confirmation: ETT inserted through vocal cords under direct vision, positive ETCO2, breath sounds checked- equal and bilateral and CO2 detector Secured at: 21 cm Tube secured with: Tape Dental Injury: Teeth and Oropharynx as per pre-operative assessment

## 2023-02-02 NOTE — H&P (Signed)
History of Present Illness:  Ronald Lowe is a 67 y.o. male who presents for follow-up a limited arthroscopy debridement, arthroscopic subacromial decompression, mini-open rotator cuff repair and mini-open biceps tenodesis for the left shoulder. The patient denies any pain in the shoulder on today's visit, but does note discomfort at times for which he will take acetaminophen as necessary with temporary partial relief. He has not been attending any formal physical therapy, but continues to do exercises on his own at home, utilizing the same exercises he learned when he was doing therapy prior to his surgery. The patient states he was doing well until he fell 1.5 weeks ago. Apparently he stepped on his grandkid's Lego at night in the dark while walking barefoot and fell forward. Since then, he has had more difficulty raising his arm. He denies any numbness or paresthesias down his arm to his hand, and denies any fevers or chills.  Current Outpatient Medications:  albuterol 90 mcg/actuation inhaler TAKE 2 PUFFS BY MOUTH EVERY 6 HOURS AS NEEDED FOR WHEEZE OR SHORTNESS OF BREATH  AMIOdarone (PACERONE) 200 MG tablet Take 200 mg by mouth 2 (two) times daily  amLODIPine (NORVASC) 5 MG tablet Take 5 mg by mouth once daily  atorvastatin (LIPITOR) 40 MG tablet Take 40 mg by mouth once daily  carvediloL (COREG) 3.125 MG tablet Take 3.125 mg by mouth 2 (two) times daily  dilTIAZem (CARDIZEM CD) 240 MG CD capsule Take 240 mg by mouth once daily  famotidine (PEPCID) 40 MG tablet Take 40 mg by mouth once daily  fluticasone propion-salmeteroL (ADVAIR HFA) 45-21 mcg/actuation inhaler Inhale 2 inhalations into the lungs 2 (two) times daily  fluticasone-umeclidinium-vilanterol (TRELEGY ELLIPTA) 100-62.5-25 mcg inhaler Take 1 puff by mouth daily.  ibuprofen (MOTRIN) 200 MG tablet Take 200 mg by mouth every 6 (six) hours as needed  INCRUSE ELLIPTA 62.5 mcg/actuation DsDv inhalation unit TAKE 1 PUFF BY MOUTH EVERY DAY   lisinopriL (ZESTRIL) 5 MG tablet TAKE 0.5 TABLETS (2.5 MG TOTAL) BY MOUTH EVERY OTHER DAY.  metoprolol tartrate (LOPRESSOR) 25 MG tablet Take 25 mg by mouth once daily  pantoprazole (PROTONIX) 40 MG DR tablet Take 40 mg by mouth once daily  PARoxetine (PAXIL) 20 MG tablet Take 20 mg by mouth once daily  rivaroxaban (XARELTO) 20 mg tablet Take by mouth   Allergies: No Known Allergies  Past Medical History:  A-fib (CMS-HCC)  CKD (chronic kidney disease)  Hyperlipidemia  Hypertension   Past Surgical History:  Limited arthroscopic debridement, arthroscopic subacromial decompression, mini-open rotator cuff repair, and mini-open biceps tenodesis, left shoulder. Left 12/07/2022 (Dr. Roland Rack)  TONSILLECTOMY   Past Family History: No pertinent family history.   Social History:   Socioeconomic History:  Marital status: Married  Spouse name: [name]  Number of children: 2  Years of education: 14  Highest education level: Associate degree: occupational, Hotel manager, or vocational program  Occupational History  Occupation: Part-time - Odd jobs  Tobacco Use  Smoking status: Every Day  Packs/day: 0.50  Years: 50.00  Additional pack years: 0.00  Total pack years: 25.00  Types: Cigarettes  Smokeless tobacco: Never  Vaping Use  Vaping Use: Never used  Substance and Sexual Activity  Alcohol use: Yes  Alcohol/week: 42.0 standard drinks of alcohol  Types: 28 Cans of beer, 14 Shots of liquor per week  Drug use: Yes  Frequency: 7.0 times per week  Comment: Marijuana  Sexual activity: Yes  Partners: Female   Review of Systems:  A comprehensive 14 point  ROS was performed, reviewed, and the pertinent orthopaedic findings are documented in the HPI.  Physical Exam: Vitals:  01/30/23 0918  BP: 124/70  Weight: 69.6 kg (153 lb 6.4 oz)  Height: 182.9 cm (6')  PainSc: 0-No pain  PainLoc: Shoulder   General/Constitutional: The patient appears to be well-nourished, well-developed, and in no  acute distress. Neuro/Psych: Normal mood and affect, oriented to person, place and time. Eyes: Non-icteric. Pupils are equal, round, and reactive to light, and exhibit synchronous movement. ENT: Unremarkable. Lymphatic: No palpable adenopathy. Respiratory: Lungs clear to auscultation, Normal chest excursion, No wheezes, and Non-labored breathing Cardiovascular: Regular rate and rhythm. No murmurs. and No edema, swelling or tenderness, except as noted in detailed exam. Integumentary: No impressive skin lesions present, except as noted in detailed exam. Musculoskeletal: Unremarkable, except as noted in detailed exam.  Left shoulder exam: Skin inspection of the left shoulder demonstrates his surgical incision and arthroscopic portal sites to be well-healed and without evidence for infection. No swelling, erythema, ecchymosis, abrasions, or other skin abnormalities are identified. He has no tenderness to palpation over the anterior lateral aspects of the shoulder. Passively, he can tolerate forward flexion to 125 degrees and abduction to 115 degrees. At 90 degrees of abduction, he can tolerate external rotation to 80 degrees and internal rotation to 55 degrees. He exhibits 3+-4/5 strength with gentle resisted internal rotation, and 3-3+/5 strength with gentle resisted external rotation and abduction with his arm at his side. He notes discomfort with both resisted external rotation as well as with resisted abduction. Grossly, he is neurovascularly intact to the left upper extremity and hand.  Assessment: 1. Recurrent complete tear of left rotator cuff.   Plan: The treatment options were discussed with the patient. In addition, patient educational materials were provided regarding the diagnosis and treatment options. Given that the patient fell and now has increased difficulty lifting his arm, I have asked my partner, Dr. Candelaria Stagers, to evaluate the rotator cuff repair by ultrasound to determine its  integrity. By his verbal report, there is a recurrent tear involving the supraspinatus tendon. Therefore, I have recommended a surgical procedure, specifically a repair of the recurrent rotator cuff tear. The procedure was discussed with the patient, as were the potential risks (including bleeding, infection, nerve and/or blood vessel injury, persistent or recurrent pain, failure of the repair, progression of arthritis, need for further surgery, blood clots, strokes, heart attacks and/or arhythmias, pneumonia, etc.) and benefits. The patient states his understanding and wishes to proceed. All of the patient's questions and concerns were answered. He can call any time with further concerns. He will follow up post-surgery, routine.    H&P reviewed and patient re-examined. No changes.

## 2023-02-02 NOTE — Op Note (Signed)
02/02/2023  2:37 PM  Patient:   Ronald Lowe  Pre-Op Diagnosis:   Recurrent rotator cuff tear, left shoulder.  Post-Op Diagnosis:   Recurrent rotator cuff tear with labral fraying and synovitis, left shoulder.  Procedure:   Limited arthroscopic debridement and mini-open repair of recurrent rotator cuff tear, left shoulder.  Anesthesia:   General endotracheal with interscalene block using Exparel placed preoperatively by the anesthesiologist.  Surgeon:   Pascal Lux, MD  Assistant:   Dorian Pod, RNFA  Findings:   As above. There was a recurrent full-thickness tear involving the anterior and mid portions of the supraspinatus fibers. The remainder of the rotator cuff was in satisfactory condition. There was mild fraying of the mid anterior labrum without detachment from the glenoid rim, as well as synovitis superiorly, posteriorly, and antero-inferiorly. The articular surfaces of the glenoid and humerus both were in satisfactory condition.  Complications:   None  Fluids:   600 cc  Estimated blood loss:   5 cc  Tourniquet time:   None  Drains:   None  Closure:   Staples      Brief clinical note:   The patient is a 67 year old male who is now 8 weeks status post a left shoulder arthroscopy with debridement, decompression, and repair of a full-thickness tear involving the supraspinatus tendon. The patient notes that he was doing well until he apparently stepped on his grandson's Lego block in the middle the night and fell forward onto his outstretched hands, reactivating his shoulder. He notes that he was no longer able to raise his arm above shoulder level following this injury. A diagnostic ultrasound was performed when he returned to the office several days ago to verify the integrity of the rotator cuff repair. This confirmed a recurrent tear of the anterior insertional fibers of the supraspinatus tendon. The patient presents at this time for definitive management of these  shoulder symptoms.  Procedure:   The patient underwent placement of an interscalene block using Exparel by the anesthesiologist in the preoperative holding area before being brought into the operating room and lain in the supine position. The patient then underwent general endotracheal intubation and anesthesia before being repositioned in the beach chair position using the beach chair positioner. The left shoulder and upper extremity were prepped with ChloraPrep solution before being draped sterilely. Preoperative antibiotics were administered. A timeout was performed to confirm the proper surgical site before the expected portal sites and incision site were injected with 0.5% Sensorcaine with epinephrine.   A posterior portal was created and the glenohumeral joint thoroughly inspected with the findings as described above. An anterior portal was created using an outside-in technique. The labrum and rotator cuff were further probed, again confirming the above-noted findings. The area of labral fraying anteriorly was abraded back to stable margins using the full-radius resector, as were areas of synovitis superiorly, posteriorly, and anteroinferiorly. The ArthroCare wand was inserted and used to obtain hemostasis as well as to "anneal" the labrum anteriorly. The instruments were removed from the joint after suctioning the excess fluid.  Utilizing the previous incision, an approximately 4 cm incision was made over the anterolateral aspect of the shoulder beginning at the anterolateral corner of the acromion and extending distally in line with the bicipital groove. This incision was carried down through the subcutaneous tissues to expose the deltoid fascia. The raphae between the anterior and middle thirds was identified and this plane developed to provide access into the subacromial space. Additional bursal  tissues were debrided sharply using Metzenbaum scissors. The rotator cuff tear was readily identified. The  retained sutures were excised and tear margins were debrided sharply with a #15 blade before the exposed greater tuberosity was roughened with a rongeur. The tear was repaired using one [employer] 2.9 mm Q-Fix anchors. These sutures were then brought back laterally and secured using one Bland knotless RegeneSorb anchors to create a two-layer closure. Several #0 Ethibond interrupted sutures were placed in a side-to-side fashion to close the small longitudinal portion of the tear anteriorly. An apparent watertight closure was obtained.  The wound was copiously irrigated with sterile saline solution before the deltoid raphae was reapproximated using 2-0 Vicryl interrupted sutures. The subcutaneous tissues were closed in two layers using 2-0 Vicryl interrupted sutures before the skin was closed using staples. The portal sites also were closed using staples. A sterile bulky dressing was applied to the shoulder before the arm was placed into a shoulder immobilizer. The patient was then awakened, extubated, and returned to the recovery room in satisfactory condition after tolerating the procedure well.

## 2023-02-02 NOTE — Discharge Instructions (Addendum)
SHOULDER SLING IMMOBILIZER   VIDEO Slingshot 2 Shoulder Brace Application - YouTube ---[email or web address]  INSTRUCTIONS While supporting the injured arm, slide the forearm into the sling. Wrap the adjustable shoulder strap around the neck and shoulders and attach the strap end to the sling using  the "alligator strap tab."  Adjust the shoulder strap to the required length. Position the shoulder pad behind the neck. To secure the shoulder pad location (optional), pull the shoulder strap away from the shoulder pad, unfold the hook material on the top of the pad, then press the shoulder strap back onto the hook material to secure the pad in place. Attach the closure strap across the open top of the sling. Position the strap so that it holds the arm securely in the sling. Next, attach the thumb strap to the open end of the sling between the thumb and fingers. After sling has been fit, it may be easily removed and reapplied using the quick release buckle on shoulder strap. If a neutral pillow or 15 abduction pillow is included, place the pillow at the waistline. Attach the sling to the pillow, lining up hook material on the pillow with the loop on sling. Adjust the waist strap to fit.  If waist strap is too long, cut it to fit. Use the small piece of double sided hook material (located on top of the pillow) to secure the strap end. Place the double sided hook material on the inside of the cut strap end and secure it to the waist strap.     If no pillow is included, attach the waist strap to the sling and adjust to fit.    Washing Instructions: Straps and sling must be removed and cleaned regularly depending on your activity level and perspiration. Hand wash straps and sling in cold water with mild detergent, rinse, air dry   Orthopedic discharge instructions: Keep dressing dry and intact.  May shower after dressing changed on post-op day #4 (Monday).  Cover staples with  Band-Aids after drying off. Apply ice frequently to shoulder. Take ibuprofen 600-800 mg TID with meals for 3-5 days, then as necessary. Take oxycodone as prescribed when needed.  May supplement with ES Tylenol if necessary. Keep shoulder immobilizer on at all times except may remove for bathing purposes. Follow-up in 10-14 days or as scheduled.  AMBULATORY SURGERY  DISCHARGE INSTRUCTIONS   The drugs that you were given will stay in your system until tomorrow so for the next 24 hours you should not:  Drive an automobile Make any legal decisions Drink any alcoholic beverage   You may resume regular meals tomorrow.  Today it is better to start with liquids and gradually work up to solid foods.  You may eat anything you prefer, but it is better to start with liquids, then soup and crackers, and gradually work up to solid foods.   Please notify your doctor immediately if you have any unusual bleeding, trouble breathing, redness and pain at the surgery site, drainage, fever, or pain not relieved by medication.    Additional Instructions: PLEASE LEAVE GREEN/TEA. BRACELET ON FOR 4 DAYS        Please contact your physician with any problems or Same Day Surgery at [phone], Monday through Friday 6 am to 4 pm, or [place] at [facility] number at [phone].

## 2023-02-02 NOTE — Transfer of Care (Signed)
Immediate Anesthesia Transfer of Care Note  Patient: Ronald Lowe  Procedure(s) Performed: LEFT SHOULDER ARTHROSCOPY WITH DEBRIDEMENT AND REPAIR OF RECURRENT ROTATOR CUFF TEAR OF LEFT SHOULDER (Left: Shoulder)  Patient Location: PACU  Anesthesia Type:General  Level of Consciousness: awake  Airway & Oxygen Therapy: Patient Spontanous Breathing and Patient connected to nasal cannula oxygen  Post-op Assessment: Report given to RN and Post -op Vital signs reviewed and stable  Post vital signs: Reviewed and stable  Last Vitals:  Vitals Value Taken Time  BP 167/101 02/02/23 1430  Temp    Pulse 69 02/02/23 1430  Resp    SpO2 99 % 02/02/23 1430  Vitals shown include unvalidated device data.  Last Pain:  Vitals:   02/02/23 1247  TempSrc:   PainSc: 0-No pain         Complications: No notable events documented.

## 2023-02-02 NOTE — Anesthesia Procedure Notes (Signed)
Anesthesia Regional Block: Interscalene brachial plexus block   Pre-Anesthetic Checklist: , timeout performed,  Correct Patient, Correct Site, Correct Laterality,  Correct Procedure, Correct Position, site marked,  Risks and benefits discussed,  Surgical consent,  Pre-op evaluation,  At surgeon's request and post-op pain management  Laterality: Left and Upper  Prep: chloraprep       Needles:  Injection technique: Single-shot  Needle Type: Stimiplex     Needle Length: 5cm  Needle Gauge: 22     Additional Needles:   Procedures:,,,, ultrasound used (permanent image in chart),,    Narrative:  Start time: 02/02/2023 12:43 PM End time: 02/02/2023 12:45 PM Injection made incrementally with aspirations every 5 mL.  Performed by: Personally  Anesthesiologist: Martha Clan, MD  Additional Notes: Functioning IV was confirmed and monitors were applied.  A 62m 22ga Stimuplex needle was used. Sterile prep and drape,hand hygiene and sterile gloves were used.  Negative aspiration and negative test dose prior to incremental administration of local anesthetic. The patient tolerated the procedure well.

## 2023-02-02 NOTE — Anesthesia Preprocedure Evaluation (Signed)
Anesthesia Evaluation  Patient identified by MRN, date of birth, ID band Patient awake    Reviewed: Allergy & Precautions, H&P , NPO status , Patient's Chart, lab work & pertinent test results, reviewed documented beta blocker date and time   History of Anesthesia Complications Negative for: history of anesthetic complications  Airway Mallampati: III  TM Distance: >3 FB Neck ROM: full    Dental  (+) Edentulous Upper, Edentulous Lower   Pulmonary shortness of breath and with exertion, COPD,  COPD inhaler, neg recent URI, Current Smoker and Patient abstained from smoking., former smoker   Pulmonary exam normal breath sounds clear to auscultation       Cardiovascular Exercise Tolerance: Good hypertension, (-) angina + CAD  (-) Past MI and (-) Cardiac Stents + dysrhythmias Atrial Fibrillation (-) Valvular Problems/Murmurs Rhythm:regular Rate:Normal     Neuro/Psych negative neurological ROS  negative psych ROS   GI/Hepatic ,GERD  ,,(+)     substance abuse  alcohol use  Endo/Other  negative endocrine ROS    Renal/GU CRFRenal disease  negative genitourinary   Musculoskeletal   Abdominal   Peds  Hematology negative hematology ROS (+)   Anesthesia Other Findings Past Medical History: No date: Aortic atherosclerosis (HCC) No date: Cervical spinal stenosis No date: Chronic kidney disease No date: COPD (chronic obstructive pulmonary disease) (HCC) No date: Coronary artery calcification seen on CT scan No date: ETOH abuse     Comment:  a.) 28+ standard drinks/week No date: GERD (gastroesophageal reflux disease) No date: History of amiodarone therapy     Comment:  a.) in the past; self discontinued No date: History of medication noncompliance     Comment:  a.) reported 10/2022 that he has been off NOAC for               several months secondary to vertiginous symptoms, not               acting like himself, and  depression; b.) off/not taking               as prescribed: antiarrhythmics, antihypertensives,               statins, and MDIs No date: Hyperlipidemia No date: Hypertension No date: Long term current use of anticoagulant     Comment:  a.) rivaroxaban No date: PAF (paroxysmal atrial fibrillation) ([place])     Comment:  a.) CHA2DS2VASc = 3 (age, HTN, vascular disease               history);  b.) rate/rhythm maintained on oral carvediolol              (self discontinued diltiazem + amiodarone + metoprolol);               chronically anticoagulated with rivaroxaban (switched               from apixaban 11/17/2022) No date: Tobacco abuse   Reproductive/Obstetrics negative OB ROS                              Anesthesia Physical Anesthesia Plan  ASA: 3  Anesthesia Plan: General   Post-op Pain Management: Regional block*   Induction: Intravenous  PONV Risk Score and Plan: 2 and Ondansetron, Dexamethasone, Treatment may vary due to age or medical condition and Midazolam  Airway Management Planned: Oral ETT  Additional Equipment:   Intra-op Plan:   Post-operative Plan: Extubation in  OR  Informed Consent: I have reviewed the patients History and Physical, chart, labs and discussed the procedure including the risks, benefits and alternatives for the proposed anesthesia with the patient or authorized representative who has indicated his/her understanding and acceptance.     Dental Advisory Given  Plan Discussed with: Anesthesiologist, CRNA and Surgeon  Anesthesia Plan Comments:          Anesthesia Quick Evaluation

## 2023-02-03 ENCOUNTER — Encounter: Payer: Self-pay | Admitting: Surgery

## 2023-02-03 NOTE — Anesthesia Postprocedure Evaluation (Signed)
Anesthesia Post Note  Patient: Ronald Lowe  Procedure(s) Performed: LEFT SHOULDER ARTHROSCOPY WITH DEBRIDEMENT AND REPAIR OF RECURRENT ROTATOR CUFF TEAR OF LEFT SHOULDER (Left: Shoulder)  Patient location during evaluation: PACU Anesthesia Type: General Level of consciousness: awake and alert Pain management: pain level controlled Vital Signs Assessment: post-procedure vital signs reviewed and stable Respiratory status: spontaneous breathing, nonlabored ventilation, respiratory function stable and patient connected to nasal cannula oxygen Cardiovascular status: blood pressure returned to baseline and stable Postop Assessment: no apparent nausea or vomiting Anesthetic complications: no   No notable events documented.   Last Vitals:  Vitals:   02/02/23 1524 02/02/23 1536  BP: (!) 160/93 130/87  Pulse: 83 94  Resp: 18 19  Temp:  (!) 36.2 C  SpO2: 99% 97%    Last Pain:  Vitals:   02/02/23 1536  TempSrc: Temporal  PainSc: 0-No pain                 Arita Miss

## 2023-02-08 NOTE — Progress Notes (Signed)
Reason for preop UDS: Prior cocaine positive on UDS, and presenting for elective surgery. Necessary to risk stratify, as cocaine in system can lead to cardiac complications and death.

## 2023-02-17 DIAGNOSIS — M7522 Bicipital tendinitis, left shoulder: Secondary | ICD-10-CM | POA: Diagnosis not present

## 2023-02-17 DIAGNOSIS — S46012A Strain of muscle(s) and tendon(s) of the rotator cuff of left shoulder, initial encounter: Secondary | ICD-10-CM | POA: Diagnosis not present

## 2023-02-17 DIAGNOSIS — M7582 Other shoulder lesions, left shoulder: Secondary | ICD-10-CM | POA: Diagnosis not present

## 2023-02-20 DIAGNOSIS — M7522 Bicipital tendinitis, left shoulder: Secondary | ICD-10-CM | POA: Diagnosis not present

## 2023-02-20 DIAGNOSIS — M7582 Other shoulder lesions, left shoulder: Secondary | ICD-10-CM | POA: Diagnosis not present

## 2023-02-20 DIAGNOSIS — S46012A Strain of muscle(s) and tendon(s) of the rotator cuff of left shoulder, initial encounter: Secondary | ICD-10-CM | POA: Diagnosis not present

## 2023-02-23 ENCOUNTER — Other Ambulatory Visit: Payer: Self-pay | Admitting: Family Medicine

## 2023-02-23 DIAGNOSIS — S46012A Strain of muscle(s) and tendon(s) of the rotator cuff of left shoulder, initial encounter: Secondary | ICD-10-CM | POA: Diagnosis not present

## 2023-02-23 DIAGNOSIS — M7522 Bicipital tendinitis, left shoulder: Secondary | ICD-10-CM | POA: Diagnosis not present

## 2023-02-23 DIAGNOSIS — M7582 Other shoulder lesions, left shoulder: Secondary | ICD-10-CM | POA: Diagnosis not present

## 2023-02-23 DIAGNOSIS — J439 Emphysema, unspecified: Secondary | ICD-10-CM

## 2023-02-23 NOTE — Telephone Encounter (Signed)
Medication Refill - Medication: TRELEGY ELLIPTA 100-62.5-25 MCG/ACT AEPB   Has the patient contacted their pharmacy? No. Pt stated he can never get anyone when he contacts the pharmacy  Preferred Pharmacy (with phone number or street name):  [id number] [address] Phone: [phone] Fax: [phone]    Has the patient been seen for an appointment in the last year OR does the patient have an upcoming appointment? Yes.    Agent: Please be advised that RX refills may take up to 3 business days. We ask that you follow-up with your pharmacy.

## 2023-02-24 NOTE — Telephone Encounter (Signed)
Requested medication (s) are due for refill today: Yes  Requested medication (s) are on the active medication list: Yes  Last refill:  11/18/22  Future visit scheduled: No  Notes to clinic:  No protocol    Requested Prescriptions  Pending Prescriptions Disp Refills   TRELEGY ELLIPTA 100-62.5-25 MCG/ACT AEPB 1 each 5    Sig: Take 1 puff by mouth daily.     Off-Protocol Failed - 02/23/2023  4:49 PM      Failed - Medication not assigned to a protocol, review manually.      Passed - Valid encounter within last 12 months    Recent Outpatient Visits           2 months ago Essential hypertension   [facility], [place]   11 months ago Essential hypertension   [facility], [place]   1 year ago Tendinitis of left shoulder   [facility], [place] Grip, [place]   1 year ago [place] [facility], [place]   1 year ago Atrial fibrillation with rapid ventricular response [facility])   [facility], MD       Future Appointments             In 2 weeks [name], Wallis Bamberg, MD [place] at [facility], [place]

## 2023-02-27 DIAGNOSIS — M7582 Other shoulder lesions, left shoulder: Secondary | ICD-10-CM | POA: Diagnosis not present

## 2023-02-27 DIAGNOSIS — S46012A Strain of muscle(s) and tendon(s) of the rotator cuff of left shoulder, initial encounter: Secondary | ICD-10-CM | POA: Diagnosis not present

## 2023-02-27 DIAGNOSIS — M7522 Bicipital tendinitis, left shoulder: Secondary | ICD-10-CM | POA: Diagnosis not present

## 2023-03-01 DIAGNOSIS — M7522 Bicipital tendinitis, left shoulder: Secondary | ICD-10-CM | POA: Diagnosis not present

## 2023-03-01 DIAGNOSIS — M7582 Other shoulder lesions, left shoulder: Secondary | ICD-10-CM | POA: Diagnosis not present

## 2023-03-01 DIAGNOSIS — S46012A Strain of muscle(s) and tendon(s) of the rotator cuff of left shoulder, initial encounter: Secondary | ICD-10-CM | POA: Diagnosis not present

## 2023-03-07 DIAGNOSIS — S46012A Strain of muscle(s) and tendon(s) of the rotator cuff of left shoulder, initial encounter: Secondary | ICD-10-CM | POA: Diagnosis not present

## 2023-03-07 DIAGNOSIS — M7582 Other shoulder lesions, left shoulder: Secondary | ICD-10-CM | POA: Diagnosis not present

## 2023-03-07 DIAGNOSIS — M7522 Bicipital tendinitis, left shoulder: Secondary | ICD-10-CM | POA: Diagnosis not present

## 2023-03-09 NOTE — Progress Notes (Signed)
Office Visit    Patient Name: Ronald Lowe Date of Encounter: 03/15/2023  PCP:  Dorna Mai, [facility]  Cardiologist:  Jenkins Rouge, MD  Advanced Practice Provider:  No care team member to display Electrophysiologist:  None   HPI    Ronald Lowe is a 67 y.o. male with a past medical history significant for atrial fibrillation, smoking, HTN, HLD, and CRF presents today for f/u   Hospitalized 06/18/2021 with multifocal pneumonia and EtOH withdrawal.  Had PAF while withdrawing.  Rx amiodarone and transition to 200 mg daily.  Baseline creatinine 1.9.  TTE with EF 50 to 55% with trivial MR, mild LAE.  Was in normal sinus rhythm at discharge with plans to DC amiodarone after 30 days.  He was seen in the office 11/2021 and was in atrial fibrillation and discussed medication compliance and EtOH.  Seen by Malka So in the A-fib clinic 12/22/2021 and was back in normal sinus rhythm.  Historically very non compliant with his medication   Reports no shortness of breath nor dyspnea on exertion. Reports no chest pain, pressure, or tightness. No edema, orthopnea, PND. Reports no palpitations.   Had left rotator cuff surgery by Dr Roland Rack on 02/02/23 Still needing PT/OT Had fluid drained and unable to lift arm over head still Indicates having 2 gin/V8 per day and smoking only 1 per month   Not clear what meds he's taking Says wife gives them to him Will need to call pharmacy   Past Medical History    Past Medical History:  Diagnosis Date   Aortic atherosclerosis ([place])    Cervical spinal stenosis    Chronic kidney disease    COPD (chronic obstructive pulmonary disease) ([place])    Coronary artery calcification seen on CT scan    ETOH abuse    a.) 28+ standard drinks/week   GERD (gastroesophageal reflux disease)    History of amiodarone therapy    a.) in the past; self discontinued   History of medication noncompliance    a.) reported 10/2022 that he has been  off NOAC for several months secondary to vertiginous symptoms, not acting like himself, and depression; b.) off/not taking as prescribed: antiarrhythmics, antihypertensives, statins, and MDIs   Hyperlipidemia    Hypertension    Long term current use of anticoagulant    a.) rivaroxaban   PAF (paroxysmal atrial fibrillation) ([place])    a.) CHA2DS2VASc = 3 (age, HTN, vascular disease history);  b.) rate/rhythm maintained on oral carvediolol (self discontinued diltiazem + amiodarone + metoprolol); chronically anticoagulated with rivaroxaban (switched from apixaban 11/17/2022)   Tobacco abuse    Past Surgical History:  Procedure Laterality Date   SHOULDER ARTHROSCOPY WITH SUBACROMIAL DECOMPRESSION, ROTATOR CUFF REPAIR AND BICEP TENDON REPAIR Left 12/07/2022   Procedure: SHOULDER ARTHROSCOPY WITH DEBRIDEMENT, DECOMPRESSION, ROTATOR CUFF REPAIR AND BICEPS TENODESIS.- RNFA;  Surgeon: Corky Mull, MD;  Location: [facility] ORS;  Service: Orthopedics;  Laterality: Left;   SHOULDER ARTHROSCOPY WITH SUBACROMIAL DECOMPRESSION, ROTATOR CUFF REPAIR AND BICEP TENDON REPAIR Left 02/02/2023   Procedure: LEFT SHOULDER ARTHROSCOPY WITH DEBRIDEMENT AND REPAIR OF RECURRENT ROTATOR CUFF TEAR OF LEFT SHOULDER;  Surgeon: Corky Mull, MD;  Location: [facility] ORS;  Service: Orthopedics;  Laterality: Left;   TONSILLECTOMY      Allergies  No Known Allergies  EKGs/Labs/Other Studies Reviewed:   The following studies were reviewed today:  Echocardiogram 06/18/2021 IMPRESSIONS     1. Left ventricular ejection fraction, by  estimation, is 50 to 55%. The  left ventricle has low normal function. The left ventricle has no regional  wall motion abnormalities. Left ventricular diastolic parameters are  consistent with Grade I diastolic  dysfunction (impaired relaxation).   2. Right ventricular systolic function is normal. The right ventricular  size is normal.   3. The mitral valve was not well visualized. Trivial mitral valve   regurgitation.   4. The aortic valve was not well visualized. Aortic valve regurgitation  is trivial.   FINDINGS   Left Ventricle: Left ventricular ejection fraction, by estimation, is 50  to 55%. The left ventricle has low normal function. The left ventricle has  no regional wall motion abnormalities. The left ventricular internal  cavity size was normal in size.  There is no left ventricular hypertrophy. Left ventricular diastolic  parameters are consistent with Grade I diastolic dysfunction (impaired  relaxation). Indeterminate filling pressures.   Right Ventricle: The right ventricular size is normal. Right vetricular  wall thickness was not well visualized. Right ventricular systolic  function is normal.   Left Atrium: Left atrial size was normal in size.   Right Atrium: Right atrial size was normal in size.   Pericardium: Trivial pericardial effusion is present.   Mitral Valve: The mitral valve was not well visualized. Trivial mitral  valve regurgitation.   Tricuspid Valve: The tricuspid valve is grossly normal. Tricuspid valve  regurgitation is trivial.   Aortic Valve: The aortic valve was not well visualized. Aortic valve  regurgitation is trivial.   Pulmonic Valve: The pulmonic valve was grossly normal. Pulmonic valve  regurgitation is not visualized.   Aorta: The aortic root and ascending aorta are structurally normal, with  no evidence of dilitation.   IAS/Shunts: The atrial septum is grossly normal.   EKG:  EKG is  ordered today.  The ekg ordered today demonstrates sinus tachycardia, rate 102 bpm  Recent Labs: 07/01/2022: ALT 33; TSH 0.872 11/07/2022: BUN 14; Creatinine, Ser 1.04; Hemoglobin 17.0; Platelets 223; Potassium 3.9; Sodium 139  Recent Lipid Panel    Component Value Date/Time   CHOL 137 07/01/2022 1039   TRIG 67 07/01/2022 1039   HDL 69 07/01/2022 1039   CHOLHDL 2.0 07/01/2022 1039   CHOLHDL 3.0 Ratio 03/22/2010 1954   VLDL 12 03/22/2010  1954   LDLCALC 54 07/01/2022 1039    Risk Assessment/Calculations:   CHA2DS2-VASc Score =     This indicates a  % annual risk of stroke. The patient's score is based upon:       Home Medications   Current Meds  Medication Sig   ADVAIR HFA 45-21 MCG/ACT inhaler Inhale 2 puffs into the lungs as needed (wheezing).   albuterol (VENTOLIN HFA) 108 (90 Base) MCG/ACT inhaler Inhale 2 puffs into the lungs every 4 (four) hours as needed for wheezing or shortness of breath.   amLODipine (NORVASC) 5 MG tablet Take 5 mg by mouth every other day.   carvedilol (COREG) 3.125 MG tablet Take 1 tablet (3.125 mg total) by mouth 2 (two) times daily.   pantoprazole (PROTONIX) 40 MG tablet Take 1 tablet (40 mg total) by mouth daily as needed.   TRELEGY ELLIPTA 100-62.5-25 MCG/ACT AEPB Take 1 puff by mouth daily.     Review of Systems      All other systems reviewed and are otherwise negative except as noted above.  Physical Exam    VS:  BP (!) 140/80   Pulse 81   Ht 6' (1.829 m)  Wt 153 lb (69.4 kg)   SpO2 98%   BMI 20.75 kg/m  , BMI Body mass index is 20.75 kg/m.  Wt Readings from Last 3 Encounters:  03/15/23 153 lb (69.4 kg)  02/02/23 154 lb (69.9 kg)  01/31/23 154 lb (69.9 kg)     Affect appropriate Healthy:  appears stated age HEENT: normal Neck supple with no adenopathy JVP normal no bruits no thyromegaly Lungs emphysema rhonchi and wheezing  Heart:  S1/S2 no murmur, no rub, gallop or click PMI normal Abdomen: benighn, BS positve, no tenderness, no AAA no bruit.  No HSM or HJR Distal pulses intact with no bruits No edema Neuro non-focal Skin warm and dry Recent left rotator cuff surgery unable to left arm over head    Assessment & Plan    Ortho: - post recurrent left rotator cuff surgery 02/02/23 improved -continue PT/OT  PAF -he took himself off his eliquis we discussed trying xarelto and he agreed to that -he took himself off metoprolol so we discussed trying  coreg 3.125 and he agreed -he is asymptomatic from a rhythm standpoint at this time  Smoking -cessation advised - Lung cancer CT   Hypertension -much better controlled today 119/80 -continue current medication regimen  Hyperlipidemia -continue lipitor -LDL 54 and at goal  6. EtOH abuse - counseled on cessation unlikely to conform   Lung cancer CT     Disposition: Follow up in a year   Signed, Jenkins Rouge, MD 03/15/2023, 2:27 PM [place]

## 2023-03-10 DIAGNOSIS — M7582 Other shoulder lesions, left shoulder: Secondary | ICD-10-CM | POA: Diagnosis not present

## 2023-03-10 DIAGNOSIS — M7522 Bicipital tendinitis, left shoulder: Secondary | ICD-10-CM | POA: Diagnosis not present

## 2023-03-10 DIAGNOSIS — S46012A Strain of muscle(s) and tendon(s) of the rotator cuff of left shoulder, initial encounter: Secondary | ICD-10-CM | POA: Diagnosis not present

## 2023-03-15 ENCOUNTER — Ambulatory Visit: Payer: Medicare HMO | Attending: Cardiovascular Disease | Admitting: Cardiovascular Disease

## 2023-03-15 ENCOUNTER — Encounter: Payer: Self-pay | Admitting: Cardiovascular Disease

## 2023-03-15 VITALS — BP 140/80 | HR 81 | Ht 72.0 in | Wt 153.0 lb

## 2023-03-15 DIAGNOSIS — E785 Hyperlipidemia, unspecified: Secondary | ICD-10-CM | POA: Diagnosis not present

## 2023-03-15 DIAGNOSIS — Z87891 Personal history of nicotine dependence: Secondary | ICD-10-CM

## 2023-03-15 DIAGNOSIS — I480 Paroxysmal atrial fibrillation: Secondary | ICD-10-CM

## 2023-03-15 DIAGNOSIS — M7582 Other shoulder lesions, left shoulder: Secondary | ICD-10-CM | POA: Diagnosis not present

## 2023-03-15 DIAGNOSIS — S46012A Strain of muscle(s) and tendon(s) of the rotator cuff of left shoulder, initial encounter: Secondary | ICD-10-CM | POA: Diagnosis not present

## 2023-03-15 DIAGNOSIS — M7522 Bicipital tendinitis, left shoulder: Secondary | ICD-10-CM | POA: Diagnosis not present

## 2023-03-15 NOTE — Patient Instructions (Addendum)
Medication Instructions:  Your physician recommends that you continue on your current medications as directed. Please refer to the Current Medication list given to you today.  *If you need a refill on your cardiac medications before your next appointment, please call your pharmacy*  Lab Work: If you have labs (blood work) drawn today and your tests are completely normal, you will receive your results only by: [employer] (if you have MyChart) OR A paper copy in the mail If you have any lab test that is abnormal or we need to change your treatment, we will call you to review the results.  Testing/Procedures: Non-Cardiac CT scanning for lung cancer screening (CAT scanning), is a noninvasive, special x-ray that produces cross-sectional images of the body using x-rays and a computer. CT scans help physicians diagnose and treat medical conditions. For some CT exams, a contrast material is used to enhance visibility in the area of the body being studied. CT scans provide greater clarity and reveal more details than regular x-ray exams.   Follow-Up: At [facility], you and your health needs are our priority.  As part of our continuing mission to provide you with exceptional heart care, we have created designated Provider Care Teams.  These Care Teams include your primary Cardiologist (physician) and Advanced Practice Providers (APPs -  Physician Assistants and Nurse Practitioners) who all work together to provide you with the care you need, when you need it.  We recommend signing up for the patient portal called "MyChart".  Sign up information is provided on this After Visit Summary.  MyChart is used to connect with patients for Virtual Visits (Telemedicine).  Patients are able to view lab/test results, encounter notes, upcoming appointments, etc.  Non-urgent messages can be sent to your provider as well.   To learn more about what you can do with MyChart, go to [email or web address].     Your next appointment:   1 year follow-up  Provider:   Dr. [name]

## 2023-03-17 DIAGNOSIS — M7582 Other shoulder lesions, left shoulder: Secondary | ICD-10-CM | POA: Diagnosis not present

## 2023-03-17 DIAGNOSIS — S46012A Strain of muscle(s) and tendon(s) of the rotator cuff of left shoulder, initial encounter: Secondary | ICD-10-CM | POA: Diagnosis not present

## 2023-03-17 DIAGNOSIS — M7522 Bicipital tendinitis, left shoulder: Secondary | ICD-10-CM | POA: Diagnosis not present

## 2023-03-21 DIAGNOSIS — S46012A Strain of muscle(s) and tendon(s) of the rotator cuff of left shoulder, initial encounter: Secondary | ICD-10-CM | POA: Diagnosis not present

## 2023-03-21 DIAGNOSIS — M7582 Other shoulder lesions, left shoulder: Secondary | ICD-10-CM | POA: Diagnosis not present

## 2023-03-21 DIAGNOSIS — M7522 Bicipital tendinitis, left shoulder: Secondary | ICD-10-CM | POA: Diagnosis not present

## 2023-03-23 ENCOUNTER — Ambulatory Visit: Payer: Self-pay

## 2023-03-23 DIAGNOSIS — S46012A Strain of muscle(s) and tendon(s) of the rotator cuff of left shoulder, initial encounter: Secondary | ICD-10-CM | POA: Diagnosis not present

## 2023-03-23 DIAGNOSIS — M7582 Other shoulder lesions, left shoulder: Secondary | ICD-10-CM | POA: Diagnosis not present

## 2023-03-23 DIAGNOSIS — M7522 Bicipital tendinitis, left shoulder: Secondary | ICD-10-CM | POA: Diagnosis not present

## 2023-03-23 NOTE — Patient Instructions (Signed)
Visit Information  Thank you for taking time to visit with me today. Please don't hesitate to contact me if I can be of assistance to you.   Following are the goals we discussed today:   Goals Addressed             This Visit's Progress    Hypertension Management       Care Coordination Interventions: Evaluation of current treatment plan related to hypertension self management and patient's adherence to plan as established by provider Reviewed medications with patient and discussed importance of compliance Discussed plans with patient for ongoing care management follow up and provided patient with direct contact information for care management team  Patient blood pressure last check 140/80 Recent left rotator cuff surgery.  Patient reports doing well  Interventions Today    Flowsheet Row Most Recent Value  Chronic Disease   Chronic disease during today's visit Hypertension (HTN), Other  [left rotator cuff repair]  General Interventions   General Interventions Discussed/Reviewed General Interventions Discussed, Doctor Visits  Doctor Visits Discussed/Reviewed Doctor Visits Discussed  Education Interventions   Education Provided Provided Education  Provided Verbal Education On Nutrition, When to see the doctor, Other  [signs of infection to surgical site]  Mental Health Interventions   Mental Health Discussed/Reviewed Mental Health Discussed, Depression  Nutrition Interventions   Nutrition Discussed/Reviewed Nutrition Discussed  Pharmacy Interventions   Pharmacy Dicussed/Reviewed Pharmacy Topics Discussed, Medications and their functions             Our next appointment is by telephone on 06/20/23 at 1030  Please call the care guide team at [phone] if you need to cancel or reschedule your appointment.   If you are experiencing a Mental Health or [place] or need someone to talk to, please call the Suicide and Crisis Lifeline: 988   Patient verbalizes  understanding of instructions and care plan provided today and agrees to view in [place]. Active MyChart status and patient understanding of how to access instructions and care plan via MyChart confirmed with patient.     The patient has been provided with contact information for the care management team and has been advised to call with any health related questions or concerns.   [name], RN, MSN [place] Management Care Management Coordinator Direct Line [phone]

## 2023-03-23 NOTE — Patient Outreach (Signed)
  Care Coordination   Follow Up Visit Note   03/23/2023 Name: Ronald Lowe MRN: [id number] DOB: 1956-10-18  Ronald Lowe is a 67 y.o. year old male who sees Dorna Mai, MD for primary care. I spoke with  [name] by phone today.  What matters to the patients health and wellness today?  Staying healthy    Goals Addressed             This Visit's Progress    Hypertension Management       Care Coordination Interventions: Evaluation of current treatment plan related to hypertension self management and patient's adherence to plan as established by provider Reviewed medications with patient and discussed importance of compliance Discussed plans with patient for ongoing care management follow up and provided patient with direct contact information for care management team  Patient blood pressure last check 140/80 Recent left rotator cuff surgery.  Patient reports doing well  Interventions Today    Flowsheet Row Most Recent Value  Chronic Disease   Chronic disease during today's visit Hypertension (HTN), Other  [left rotator cuff repair]  General Interventions   General Interventions Discussed/Reviewed General Interventions Discussed, Doctor Visits  Doctor Visits Discussed/Reviewed Doctor Visits Discussed  Education Interventions   Education Provided Provided Education  Provided Verbal Education On Nutrition, When to see the doctor, Other  [signs of infection to surgical site]  Mental Health Interventions   Mental Health Discussed/Reviewed Mental Health Discussed, Depression  Nutrition Interventions   Nutrition Discussed/Reviewed Nutrition Discussed  Pharmacy Interventions   Pharmacy Dicussed/Reviewed Pharmacy Topics Discussed, Medications and their functions             SDOH assessments and interventions completed:  Yes     Care Coordination Interventions:  Yes, provided   Follow up plan: Follow up call scheduled for July    Encounter Outcome:  Pt. Visit  Completed   [name], RN, MSN [place] Management Care Management Coordinator Direct Line [phone]

## 2023-03-27 ENCOUNTER — Other Ambulatory Visit: Payer: Self-pay | Admitting: Family Medicine

## 2023-03-27 DIAGNOSIS — S46012A Strain of muscle(s) and tendon(s) of the rotator cuff of left shoulder, initial encounter: Secondary | ICD-10-CM | POA: Diagnosis not present

## 2023-03-27 DIAGNOSIS — M7522 Bicipital tendinitis, left shoulder: Secondary | ICD-10-CM | POA: Diagnosis not present

## 2023-03-27 DIAGNOSIS — J439 Emphysema, unspecified: Secondary | ICD-10-CM

## 2023-03-27 DIAGNOSIS — M7582 Other shoulder lesions, left shoulder: Secondary | ICD-10-CM | POA: Diagnosis not present

## 2023-03-27 NOTE — Telephone Encounter (Signed)
Copied from CRM [id number]. Topic: General - Other >> Mar 27, 2023 11:37 AM Everette C wrote: Reason for CRM: Medication Refill - Medication: TRELEGY ELLIPTA 100-62.5-25 MCG/ACT AEPB [[id number]]  Has the patient contacted their pharmacy? Yes.   (Agent: If no, request that the patient contact the pharmacy for the refill. If patient does not wish to contact the pharmacy document the reason why and proceed with request.) (Agent: If yes, when and what did the pharmacy advise?)  Preferred Pharmacy (with phone number or street name): [facility] - [place], [place] - [address] [place] [address] Phone: [phone] Fax: [phone] Hours: Not open 24 hours   Has the patient been seen for an appointment in the last year OR does the patient have an upcoming appointment? Yes.    Agent: Please be advised that RX refills may take up to 3 business days. We ask that you follow-up with your pharmacy.

## 2023-03-28 NOTE — Telephone Encounter (Signed)
Requested medication (s) are due for refill today - no  Requested medication (s) are on the active medication list -yes  Future visit scheduled -no  Last refill: 11/18/22 1each 5RF  Notes to clinic: off protocol- provider review, outside provider  Requested Prescriptions  Pending Prescriptions Disp Refills   TRELEGY ELLIPTA 100-62.5-25 MCG/ACT AEPB 1 each 5    Sig: Take 1 puff by mouth daily.     Off-Protocol Failed - 03/27/2023 12:00 PM      Failed - Medication not assigned to a protocol, review manually.      Passed - Valid encounter within last 12 months    Recent Outpatient Visits           3 months ago Essential hypertension   [facility], [place]   1 year ago Essential hypertension   [place] [facility], [place]   1 year ago Tendinitis of left shoulder   [place] [facility], [place]andra [place], [place]   1 year ago Dysthymia   [place] [facility], [place]   1 year ago Atrial fibrillation with rapid ventricular response [facility])   [place] [facility], MD                 Requested Prescriptions  Pending Prescriptions Disp Refills   TRELEGY ELLIPTA 100-62.5-25 MCG/ACT AEPB 1 each 5    Sig: Take 1 puff by mouth daily.     Off-Protocol Failed - 03/27/2023 12:00 PM      Failed - Medication not assigned to a protocol, review manually.      Passed - Valid encounter within last 12 months    Recent Outpatient Visits           3 months ago Essential hypertension   [place] [facility], MD   1 year ago Essential hypertension   [place] [facility], [place]   1 year ago Tendinitis of left shoulder   [place] [facility], [place], [place]   1 year ago Dysthymia   [place] [facility],  MD   1 year ago Atrial fibrillation with rapid ventricular response [facility])   [place] [facility] at [facility], MD

## 2023-03-30 ENCOUNTER — Other Ambulatory Visit: Payer: Self-pay | Admitting: *Deleted

## 2023-03-30 DIAGNOSIS — S46012A Strain of muscle(s) and tendon(s) of the rotator cuff of left shoulder, initial encounter: Secondary | ICD-10-CM | POA: Diagnosis not present

## 2023-03-30 DIAGNOSIS — M7522 Bicipital tendinitis, left shoulder: Secondary | ICD-10-CM | POA: Diagnosis not present

## 2023-03-30 DIAGNOSIS — M7582 Other shoulder lesions, left shoulder: Secondary | ICD-10-CM | POA: Diagnosis not present

## 2023-03-30 MED ORDER — LISINOPRIL 5 MG PO TABS: 5.0000 mg | ORAL_TABLET | Freq: Every day | ORAL | 0 refills | Status: DC

## 2023-03-30 MED ORDER — PANTOPRAZOLE SODIUM 40 MG PO TBEC: 40.0000 mg | DELAYED_RELEASE_TABLET | Freq: Every day | ORAL | 0 refills | Status: DC | PRN

## 2023-04-03 ENCOUNTER — Other Ambulatory Visit: Payer: Self-pay | Admitting: *Deleted

## 2023-04-03 DIAGNOSIS — M7582 Other shoulder lesions, left shoulder: Secondary | ICD-10-CM | POA: Diagnosis not present

## 2023-04-03 DIAGNOSIS — S46012A Strain of muscle(s) and tendon(s) of the rotator cuff of left shoulder, initial encounter: Secondary | ICD-10-CM | POA: Diagnosis not present

## 2023-04-03 DIAGNOSIS — M7522 Bicipital tendinitis, left shoulder: Secondary | ICD-10-CM | POA: Diagnosis not present

## 2023-04-03 MED ORDER — PANTOPRAZOLE SODIUM 40 MG PO TBEC: 40.0000 mg | DELAYED_RELEASE_TABLET | Freq: Every day | ORAL | 0 refills | Status: DC | PRN

## 2023-04-03 MED ORDER — LISINOPRIL 5 MG PO TABS: 5.0000 mg | ORAL_TABLET | Freq: Every day | ORAL | 0 refills | Status: DC

## 2023-04-06 ENCOUNTER — Ambulatory Visit (HOSPITAL_COMMUNITY)
Admission: RE | Admit: 2023-04-06 | Discharge: 2023-04-06 | Disposition: A | Discharge status: Home / Self Care | Payer: Medicare HMO | Source: Ambulatory Visit | Attending: Cardiovascular Disease | Admitting: Cardiovascular Disease

## 2023-04-06 DIAGNOSIS — M7582 Other shoulder lesions, left shoulder: Secondary | ICD-10-CM | POA: Diagnosis not present

## 2023-04-06 DIAGNOSIS — I480 Paroxysmal atrial fibrillation: Secondary | ICD-10-CM

## 2023-04-06 DIAGNOSIS — Z87891 Personal history of nicotine dependence: Secondary | ICD-10-CM | POA: Diagnosis not present

## 2023-04-06 DIAGNOSIS — E785 Hyperlipidemia, unspecified: Secondary | ICD-10-CM | POA: Insufficient documentation

## 2023-04-06 DIAGNOSIS — F17210 Nicotine dependence, cigarettes, uncomplicated: Secondary | ICD-10-CM | POA: Diagnosis not present

## 2023-04-06 DIAGNOSIS — S46012A Strain of muscle(s) and tendon(s) of the rotator cuff of left shoulder, initial encounter: Secondary | ICD-10-CM | POA: Diagnosis not present

## 2023-04-06 DIAGNOSIS — M7522 Bicipital tendinitis, left shoulder: Secondary | ICD-10-CM | POA: Diagnosis not present

## 2023-04-10 ENCOUNTER — Telehealth: Payer: Self-pay | Admitting: Cardiovascular Disease

## 2023-04-10 DIAGNOSIS — M7582 Other shoulder lesions, left shoulder: Secondary | ICD-10-CM | POA: Diagnosis not present

## 2023-04-10 DIAGNOSIS — M7522 Bicipital tendinitis, left shoulder: Secondary | ICD-10-CM | POA: Diagnosis not present

## 2023-04-10 DIAGNOSIS — S46012A Strain of muscle(s) and tendon(s) of the rotator cuff of left shoulder, initial encounter: Secondary | ICD-10-CM | POA: Diagnosis not present

## 2023-04-10 DIAGNOSIS — R911 Solitary pulmonary nodule: Secondary | ICD-10-CM

## 2023-04-10 NOTE — Telephone Encounter (Signed)
[facility] Radiology calling with report CT chest lung CA screening.

## 2023-04-10 NOTE — Telephone Encounter (Signed)
-----   Message from [name], MD sent at 04/09/2023 12:07 PM EDT ----- Suspicious lesion for lung cancer refer to pulmonary and [facility] ASAP

## 2023-04-10 NOTE — Telephone Encounter (Signed)
  [name], MD 04/09/2023 12:07 PM EDT Back to Top    Suspicious lesion for lung cancer refer to pulmonary and [facility] ASAP   Left message for patient to call back.

## 2023-04-10 NOTE — Telephone Encounter (Signed)
Patient aware of results. Referrals have already been ordered, see other note from today.

## 2023-04-10 NOTE — Telephone Encounter (Signed)
Spoke with Erskine Squibb from [facility] radiology) reporting abnormal chest imagining results.  Impression 1:  New aggressive appearing spiculated nodule in the right middle lobe categorized as Lung-RADS 4BS, suspicious.   Impression 2: Specifically, there is aortic atherosclerosis, in addition to left main and three-vessel coronary artery disease.   Will forward to MD and nurse

## 2023-04-12 DIAGNOSIS — S46012A Strain of muscle(s) and tendon(s) of the rotator cuff of left shoulder, initial encounter: Secondary | ICD-10-CM | POA: Diagnosis not present

## 2023-04-12 DIAGNOSIS — M7582 Other shoulder lesions, left shoulder: Secondary | ICD-10-CM | POA: Diagnosis not present

## 2023-04-12 DIAGNOSIS — M7522 Bicipital tendinitis, left shoulder: Secondary | ICD-10-CM | POA: Diagnosis not present

## 2023-04-13 ENCOUNTER — Telehealth: Payer: Self-pay | Admitting: *Deleted

## 2023-04-13 MED ORDER — DILTIAZEM HCL ER COATED BEADS 240 MG PO CP24: 240.0000 mg | ORAL_CAPSULE | Freq: Every day | ORAL | 3 refills | Status: DC

## 2023-04-13 NOTE — Telephone Encounter (Signed)
Patient was started on Diltiazem 240 mg, and amlodipine 5 mg was discontinued on 11/19/21. Patient should not be taking  both. Called patient. Patient is only taking Diltiazem 240 mg, and not amlodipine.

## 2023-04-13 NOTE — Telephone Encounter (Signed)
Received a fax from [facility] wanting clarification if pt is on Amlodipine 5 mg daily or Dilitazem ER 240 daily for blood pressure?  Will send to Dr. Ricki Miller RN to clarify.

## 2023-04-17 ENCOUNTER — Ambulatory Visit (INDEPENDENT_AMBULATORY_CARE_PROVIDER_SITE_OTHER): Payer: Medicare HMO | Admitting: Pulmonary Disease

## 2023-04-17 ENCOUNTER — Encounter: Payer: Self-pay | Admitting: Pulmonary Disease

## 2023-04-17 VITALS — BP 140/100 | HR 78 | Ht 72.0 in | Wt 163.8 lb

## 2023-04-17 DIAGNOSIS — R918 Other nonspecific abnormal finding of lung field: Secondary | ICD-10-CM

## 2023-04-17 DIAGNOSIS — F17200 Nicotine dependence, unspecified, uncomplicated: Secondary | ICD-10-CM

## 2023-04-17 DIAGNOSIS — R911 Solitary pulmonary nodule: Secondary | ICD-10-CM | POA: Diagnosis not present

## 2023-04-17 MED ORDER — BUPROPION HCL ER (SR) 150 MG PO TB12: 150.0000 mg | ORAL_TABLET | Freq: Two times a day (BID) | ORAL | 2 refills | Status: DC

## 2023-04-17 NOTE — Progress Notes (Signed)
Synopsis: Referred in April 2024 pulmonary nodule by [name], MD  Subjective:   PATIENT ID: Ronald Lowe GENDER: male DOB: [birth year], MRN: [id number]  Chief Complaint  Patient presents with   Consult    Lung nodule.    This is a 67 year old gentleman, past medical history of COPD, previous alcohol use, gastroesophageal reflux, hypertension, hyperlipidemia.  He still smokes and uses alcohol.  He had a CT scan of the chest for lung cancer screening that showed a right upper lobe inflammatory appearing lung nodule that was new in comparison to previous imaging last year.  From respiratory standpoint he was sick during his previous scan with cough and sputum production.  He feels better now.    Past Medical History:  Diagnosis Date   Aortic atherosclerosis (HCC)    Cervical spinal stenosis    Chronic kidney disease    COPD (chronic obstructive pulmonary disease) (HCC)    Coronary artery calcification seen on CT scan    ETOH abuse    a.) 28+ standard drinks/week   GERD (gastroesophageal reflux disease)    History of amiodarone therapy    a.) in the past; self discontinued   History of medication noncompliance    a.) reported 10/2022 that he has been off NOAC for several months secondary to vertiginous symptoms, not acting like himself, and depression; b.) off/not taking as prescribed: antiarrhythmics, antihypertensives, statins, and MDIs   Hyperlipidemia    Hypertension    Long term current use of anticoagulant    a.) rivaroxaban   PAF (paroxysmal atrial fibrillation) (HCC)    a.) CHA2DS2VASc = 3 (age, HTN, vascular disease history);  b.) rate/rhythm maintained on oral carvediolol (self discontinued diltiazem + amiodarone + metoprolol); chronically anticoagulated with rivaroxaban (switched from apixaban 11/17/2022)   Tobacco abuse      Family History  Problem Relation Age of Onset   Emphysema Father    CAD Father        CABG   Stroke Mother    CAD Brother 42        CABG   Hypertension Brother      Past Surgical History:  Procedure Laterality Date   SHOULDER ARTHROSCOPY WITH SUBACROMIAL DECOMPRESSION, ROTATOR CUFF REPAIR AND BICEP TENDON REPAIR Left 12/07/2022   Procedure: SHOULDER ARTHROSCOPY WITH DEBRIDEMENT, DECOMPRESSION, ROTATOR CUFF REPAIR AND BICEPS TENODESIS.- RNFA;  Surgeon: Christena Flake, MD;  Location: [facility]S;  Service: Orthopedics;  Laterality: Left;   SHOULDER ARTHROSCOPY WITH SUBACROMIAL DECOMPRESSION, ROTATOR CUFF REPAIR AND BICEP TENDON REPAIR Left 02/02/2023   Procedure: LEFT SHOULDER ARTHROSCOPY WITH DEBRIDEMENT AND REPAIR OF RECURRENT ROTATOR CUFF TEAR OF LEFT SHOULDER;  Surgeon: Christena Flake, MD;  Location: [facility];  Service: Orthopedics;  Laterality: Left;   TONSILLECTOMY      Social History   Socioeconomic History   Marital status: Married    Spouse name: Bertram Denver   Number of children: 2   Years of education: Not on file   Highest education level: Not on file  Occupational History   Occupation: retired  Tobacco Use   Smoking status: Every Day    Packs/day: 0.25    Years: 50.00    Additional pack years: 0.00    Total pack years: 12.50    Types: Cigarettes    Start date: 12/19/1970   Smokeless tobacco: Former   Tobacco comments:    Smokes 3 packs of cigarettes a week. 04/17/23 Tay  Vaping Use   Vaping Use: Never used  Substance and Sexual Activity   Alcohol use: Yes    Alcohol/week: 28.0 standard drinks of alcohol    Types: 14 Cans of beer, 14 Shots of liquor per week   Drug use: Yes    Types: Marijuana    Comment: + cocaine UDS 06/16/2021   Sexual activity: Yes    Partners: Female    Birth control/protection: None  Other Topics Concern   Not on file  Social History Narrative   Lives with wife.   Social Determinants of Health   Financial Resource Strain: Low Risk  (12/01/2022)   Overall Financial Resource Strain (CARDIA)    Difficulty of Paying Living Expenses: Not hard at all  Food Insecurity: No  Food Insecurity (12/01/2022)   Hunger Vital Sign    Worried About Running Out of Food in the Last Year: Never true    Ran Out of Food in the Last Year: Never true  Transportation Needs: No Transportation Needs (12/01/2022)   PRAPARE - Administrator, Civil Service (Medical): No    Lack of Transportation (Non-Medical): No  Physical Activity: Sufficiently Active (12/01/2022)   Exercise Vital Sign    Days of Exercise per Week: 5 days    Minutes of Exercise per Session: 30 min  Stress: No Stress Concern Present (12/01/2022)   [employer] of Occupational Health - Occupational Stress Questionnaire    Feeling of Stress : Not at all  Social Connections: Socially Integrated (12/01/2022)   Social Connection and Isolation Panel [NHANES]    Frequency of Communication with Friends and Family: More than three times a week    Frequency of Social Gatherings with Friends and Family: More than three times a week    Attends Religious Services: More than 4 times per year    Active Member of [employer] or Organizations: Yes    Attends Engineer, structural: More than 4 times per year    Marital Status: Married  Catering manager Violence: Not At Risk (12/01/2022)   Humiliation, Afraid, Rape, and Kick questionnaire    Fear of Current or Ex-Partner: No    Emotionally Abused: No    Physically Abused: No    Sexually Abused: No     No Known Allergies   Outpatient Medications Prior to Visit  Medication Sig Dispense Refill   ADVAIR HFA 45-21 MCG/ACT inhaler Inhale 2 puffs into the lungs as needed (wheezing).     albuterol (VENTOLIN HFA) 108 (90 Base) MCG/ACT inhaler Inhale 2 puffs into the lungs every 4 (four) hours as needed for wheezing or shortness of breath. 18 each 5   amiodarone (PACERONE) 200 MG tablet Take 200 mg by mouth daily.     atorvastatin (LIPITOR) 40 MG tablet Take 40 mg by mouth daily.     carvedilol (COREG) 3.125 MG tablet Take 1 tablet (3.125 mg total) by mouth 2  (two) times daily. 180 tablet 3   diltiazem (CARDIZEM CD) 240 MG 24 hr capsule Take 1 capsule (240 mg total) by mouth daily. 90 capsule 3   famotidine (PEPCID) 40 MG tablet Take 40 mg by mouth daily.     lisinopril (ZESTRIL) 5 MG tablet Take 1 tablet (5 mg total) by mouth daily. 90 tablet 0   oxycodone (OXY-IR) 5 MG capsule Take 10 mg by mouth every 4 (four) hours as needed for pain.     pantoprazole (PROTONIX) 40 MG tablet Take 1 tablet (40 mg total) by mouth daily as needed. 30 tablet 0  TRELEGY ELLIPTA 100-62.5-25 MCG/ACT AEPB Take 1 puff by mouth daily. 1 each 5   No facility-administered medications prior to visit.    Review of Systems  Constitutional:  Negative for chills, fever, malaise/fatigue and weight loss.  HENT:  Negative for hearing loss, sore throat and tinnitus.   Eyes:  Negative for blurred vision and double vision.  Respiratory:  Positive for cough, sputum production and shortness of breath. Negative for hemoptysis, wheezing and stridor.   Cardiovascular:  Negative for chest pain, palpitations, orthopnea, leg swelling and PND.  Gastrointestinal:  Negative for abdominal pain, constipation, diarrhea, heartburn, nausea and vomiting.  Genitourinary:  Negative for dysuria, hematuria and urgency.  Musculoskeletal:  Negative for joint pain and myalgias.  Skin:  Negative for itching and rash.  Neurological:  Negative for dizziness, tingling, weakness and headaches.  Endo/Heme/Allergies:  Negative for environmental allergies. Does not bruise/bleed easily.  Psychiatric/Behavioral:  Negative for depression. The patient is not nervous/anxious and does not have insomnia.   All other systems reviewed and are negative.    Objective:  Physical Exam Vitals reviewed.  Constitutional:      General: He is not in acute distress.    Appearance: He is well-developed.  HENT:     Head: Normocephalic and atraumatic.     Mouth/Throat:     Pharynx: No oropharyngeal exudate.  Eyes:      Conjunctiva/sclera: Conjunctivae normal.     Pupils: Pupils are equal, round, and reactive to light.  Neck:     Vascular: No JVD.     Trachea: No tracheal deviation.     Comments: Loss of supraclavicular fat Cardiovascular:     Rate and Rhythm: Normal rate and regular rhythm.     Heart sounds: S1 normal and S2 normal.     Comments: Distant heart tones Pulmonary:     Effort: No tachypnea or accessory muscle usage.     Breath sounds: No stridor. Decreased breath sounds (throughout all lung fields) present. No wheezing, rhonchi or rales.     Comments: Severely diminished breath sounds bilaterally Abdominal:     General: Bowel sounds are normal. There is no distension.     Palpations: Abdomen is soft.     Tenderness: There is no abdominal tenderness.  Musculoskeletal:        General: Deformity (muscle wasting ) present.  Skin:    General: Skin is warm and dry.     Capillary Refill: Capillary refill takes less than 2 seconds.     Findings: No rash.  Neurological:     Mental Status: He is alert and oriented to person, place, and time.  Psychiatric:        Behavior: Behavior normal.      Vitals:   04/17/23 1113  BP: (!) 140/100  Pulse: 78  SpO2: 98%  Weight: 163 lb 12.8 oz (74.3 kg)  Height: 6' (1.829 m)   98% on RA BMI Readings from Last 3 Encounters:  04/17/23 22.22 kg/m  03/15/23 20.75 kg/m  02/02/23 20.89 kg/m   Wt Readings from Last 3 Encounters:  04/17/23 163 lb 12.8 oz (74.3 kg)  03/15/23 153 lb (69.4 kg)  02/02/23 154 lb (69.9 kg)     CBC    Component Value Date/Time   WBC 5.3 11/07/2022 1306   RBC 4.82 11/07/2022 1306   HGB 17.0 11/07/2022 1306   HGB 15.8 07/01/2022 1039   HCT 47.9 11/07/2022 1306   HCT 45.1 07/01/2022 1039   PLT 223 11/07/2022 1306  PLT 280 07/01/2022 1039   MCV 99.4 11/07/2022 1306   MCV 103 (H) 07/01/2022 1039   MCH 35.3 (H) 11/07/2022 1306   MCHC 35.5 11/07/2022 1306   RDW 13.4 11/07/2022 1306   RDW 13.7 07/01/2022 1039    LYMPHSABS 0.8 07/01/2022 1039   MONOABS 0.7 11/17/2021 0030   EOSABS 0.2 07/01/2022 1039   BASOSABS 0.1 07/01/2022 1039     Chest Imaging:  April 2024 lung cancer screening CT: New right middle lobe pulmonary nodule.  Has spiculated margins concerning for potential underlying malignancy. He has tethering and some areas of groundglass which suggested could be underlying inflammatory lesion. He also was sick at this time. The patient's images have been independently reviewed by me.    Pulmonary Functions Testing Results:     No data to display          FeNO:   Pathology:   Echocardiogram:   Heart Catheterization:     Assessment & Plan:     ICD-10-CM   1. Lung nodule  R91.1 NM PET Image Initial (PI) Skull Base To Thigh (F-18 FDG)    2. Tobacco use disorder  F17.200     3. Abnormal CT lung screening  R91.8       Discussion:  This 67 year old gentleman with an abnormal lung cancer screening CT, COPD on triple therapy inhaler regimen with Trelegy.  Has daily cough and sputum production.  Unfortunately he is still smoking.  He has a right upper lobe pulmonary nodule with some features to suggest it may be inflammatory in nature as he was sick at that time as well.  Plan: We will have a nuclear medicine PET scan for further evaluation. Based on the PET scan will make decisions whether or not he would benefit from surgical intervention or potential need for biopsy first. Unfortunately he is still continuing to be use alcohol and cigarettes. He would need pulmonary function test to be completed before would be considered a candidate for surgery. He already has an appointment with thoracic surgery.  I will reach out to their scheduler I think this needs to be held off until we get further information. As I do believe there is a chance this may be an inflammatory lesion. And that we will make a decision after he has nuclear medicine PET scan.  Return to clinic after  PET complete with APP.    Current Outpatient Medications:    ADVAIR HFA 45-21 MCG/ACT inhaler, Inhale 2 puffs into the lungs as needed (wheezing)., Disp: , Rfl:    albuterol (VENTOLIN HFA) 108 (90 Base) MCG/ACT inhaler, Inhale 2 puffs into the lungs every 4 (four) hours as needed for wheezing or shortness of breath., Disp: 18 each, Rfl: 5   amiodarone (PACERONE) 200 MG tablet, Take 200 mg by mouth daily., Disp: , Rfl:    atorvastatin (LIPITOR) 40 MG tablet, Take 40 mg by mouth daily., Disp: , Rfl:    buPROPion (WELLBUTRIN SR) 150 MG 12 hr tablet, Take 1 tablet (150 mg total) by mouth 2 (two) times daily. Start one pill once per day for 3 days then increase to twice daily, Disp: 60 tablet, Rfl: 2   carvedilol (COREG) 3.125 MG tablet, Take 1 tablet (3.125 mg total) by mouth 2 (two) times daily., Disp: 180 tablet, Rfl: 3   diltiazem (CARDIZEM CD) 240 MG 24 hr capsule, Take 1 capsule (240 mg total) by mouth daily., Disp: 90 capsule, Rfl: 3   famotidine (PEPCID) 40 MG tablet,  Take 40 mg by mouth daily., Disp: , Rfl:    lisinopril (ZESTRIL) 5 MG tablet, Take 1 tablet (5 mg total) by mouth daily., Disp: 90 tablet, Rfl: 0   oxycodone (OXY-IR) 5 MG capsule, Take 10 mg by mouth every 4 (four) hours as needed for pain., Disp: , Rfl:    pantoprazole (PROTONIX) 40 MG tablet, Take 1 tablet (40 mg total) by mouth daily as needed., Disp: 30 tablet, Rfl: 0   TRELEGY ELLIPTA 100-62.5-25 MCG/ACT AEPB, Take 1 puff by mouth daily., Disp: 1 each, Rfl: 5   I spent 42 minutes dedicated to the care of this patient on the date of this encounter to include pre-visit review of records, face-to-face time with the patient discussing conditions above, post visit ordering of testing, clinical documentation with the electronic health record, making appropriate referrals as documented, and communicating necessary findings to members of the patients care team.    Josephine Igo, DO [facility] 04/17/2023  1:33 PM

## 2023-04-17 NOTE — Patient Instructions (Addendum)
Thank you for visiting Dr. [name] at [facility]. Today we recommend the following: Orders Placed This Encounter  Procedures   NM PET Image Initial (PI) Skull Base To Thigh (F-18 FDG)   Meds ordered this encounter  Medications   buPROPion (WELLBUTRIN SR) 150 MG 12 hr tablet    Sig: Take 1 tablet (150 mg total) by mouth 2 (two) times daily. Start one pill once per day for 3 days then increase to twice daily    Dispense:  60 tablet    Refill:  2   Return in about 4 weeks (around 05/15/2023) for with APP, after PET complete.    Please do your part to reduce the spread of COVID-19.

## 2023-04-20 DIAGNOSIS — M7582 Other shoulder lesions, left shoulder: Secondary | ICD-10-CM | POA: Diagnosis not present

## 2023-04-20 DIAGNOSIS — S46012A Strain of muscle(s) and tendon(s) of the rotator cuff of left shoulder, initial encounter: Secondary | ICD-10-CM | POA: Diagnosis not present

## 2023-04-20 DIAGNOSIS — M7522 Bicipital tendinitis, left shoulder: Secondary | ICD-10-CM | POA: Diagnosis not present

## 2023-04-21 DIAGNOSIS — M7582 Other shoulder lesions, left shoulder: Secondary | ICD-10-CM | POA: Diagnosis not present

## 2023-04-21 DIAGNOSIS — M7522 Bicipital tendinitis, left shoulder: Secondary | ICD-10-CM | POA: Diagnosis not present

## 2023-04-21 DIAGNOSIS — S46012A Strain of muscle(s) and tendon(s) of the rotator cuff of left shoulder, initial encounter: Secondary | ICD-10-CM | POA: Diagnosis not present

## 2023-04-24 ENCOUNTER — Encounter: Payer: Medicare HMO | Admitting: Thoracic Surgery (Cardiothoracic Vascular Surgery)

## 2023-04-25 DIAGNOSIS — M7522 Bicipital tendinitis, left shoulder: Secondary | ICD-10-CM | POA: Diagnosis not present

## 2023-04-25 DIAGNOSIS — S46012A Strain of muscle(s) and tendon(s) of the rotator cuff of left shoulder, initial encounter: Secondary | ICD-10-CM | POA: Diagnosis not present

## 2023-04-25 DIAGNOSIS — M7582 Other shoulder lesions, left shoulder: Secondary | ICD-10-CM | POA: Diagnosis not present

## 2023-04-27 ENCOUNTER — Other Ambulatory Visit: Payer: Self-pay | Admitting: *Deleted

## 2023-04-27 DIAGNOSIS — S46012A Strain of muscle(s) and tendon(s) of the rotator cuff of left shoulder, initial encounter: Secondary | ICD-10-CM | POA: Diagnosis not present

## 2023-04-27 DIAGNOSIS — M7582 Other shoulder lesions, left shoulder: Secondary | ICD-10-CM | POA: Diagnosis not present

## 2023-04-27 DIAGNOSIS — M7522 Bicipital tendinitis, left shoulder: Secondary | ICD-10-CM | POA: Diagnosis not present

## 2023-04-27 MED ORDER — ADVAIR HFA 45-21 MCG/ACT IN AERO: 2.0000 | INHALATION_SPRAY | RESPIRATORY_TRACT | 3 refills | Status: DC | PRN

## 2023-05-03 DIAGNOSIS — M7522 Bicipital tendinitis, left shoulder: Secondary | ICD-10-CM | POA: Diagnosis not present

## 2023-05-03 DIAGNOSIS — M7582 Other shoulder lesions, left shoulder: Secondary | ICD-10-CM | POA: Diagnosis not present

## 2023-05-03 DIAGNOSIS — S46012A Strain of muscle(s) and tendon(s) of the rotator cuff of left shoulder, initial encounter: Secondary | ICD-10-CM | POA: Diagnosis not present

## 2023-05-04 DIAGNOSIS — S46012A Strain of muscle(s) and tendon(s) of the rotator cuff of left shoulder, initial encounter: Secondary | ICD-10-CM | POA: Diagnosis not present

## 2023-05-04 DIAGNOSIS — M7522 Bicipital tendinitis, left shoulder: Secondary | ICD-10-CM | POA: Diagnosis not present

## 2023-05-04 DIAGNOSIS — M7582 Other shoulder lesions, left shoulder: Secondary | ICD-10-CM | POA: Diagnosis not present

## 2023-05-05 ENCOUNTER — Encounter (HOSPITAL_COMMUNITY): Payer: Medicare HMO

## 2023-05-05 ENCOUNTER — Ambulatory Visit: Payer: Self-pay

## 2023-05-05 NOTE — Telephone Encounter (Signed)
  Chief Complaint: Pharmacy needs clarification on sig Symptoms:  Frequency: Pertinent Negatives: Patient denies  Disposition: [] ED /[] Urgent Care (no appt availability in office) / [] Appointment(In office/virtual)/ []  [facility]/ [] Home Care/ [] Refused Recommended Disposition /[]  Mobile Bus/ [x]  Follow-up with PCP Additional Notes: Please return pharmacy call to clarify Sig for this medication.  Summary: Clairity on Medication   [facility] is calling to receive clairity on the sig for ADVAIR [facility] 45-21 MCG/ACT inhaler [[id number]]. Please advise     Reason for Disposition  [1] Pharmacy calling with prescription question AND [2] triager unable to answer question  Answer Assessment - Initial Assessment Questions 1. NAME of MEDICINE: "What medicine(s) are you calling about?"     Advair  2. QUESTION: "What is your question?" (e.g., double dose of medicine, side effect)     Sig needs clarification  Protocols used: Medication Question Call-A-AH

## 2023-05-09 ENCOUNTER — Other Ambulatory Visit: Payer: Self-pay | Admitting: Internal Medicine

## 2023-05-09 DIAGNOSIS — S46012A Strain of muscle(s) and tendon(s) of the rotator cuff of left shoulder, initial encounter: Secondary | ICD-10-CM | POA: Diagnosis not present

## 2023-05-09 DIAGNOSIS — M7582 Other shoulder lesions, left shoulder: Secondary | ICD-10-CM | POA: Diagnosis not present

## 2023-05-09 DIAGNOSIS — J439 Emphysema, unspecified: Secondary | ICD-10-CM

## 2023-05-09 DIAGNOSIS — M7522 Bicipital tendinitis, left shoulder: Secondary | ICD-10-CM | POA: Diagnosis not present

## 2023-05-10 ENCOUNTER — Other Ambulatory Visit: Payer: Self-pay | Admitting: Family Medicine

## 2023-05-10 DIAGNOSIS — Z87891 Personal history of nicotine dependence: Secondary | ICD-10-CM

## 2023-05-10 DIAGNOSIS — R058 Other specified cough: Secondary | ICD-10-CM

## 2023-05-10 NOTE — Telephone Encounter (Signed)
Requested Prescriptions  Pending Prescriptions Disp Refills   ADVAIR HFA 45-21 MCG/ACT inhaler [Pharmacy Med Name: ADVAIR HFA 45-21 MCG INHALER] 12 each 12    Sig: INHALE 2 PUFFS INTO THE LUNGS TWICE A DAY     Pulmonology:  Combination Products Passed - 05/10/2023  3:12 PM      Passed - Valid encounter within last 12 months    Recent Outpatient Visits           5 months ago Essential hypertension   [place] [facility], [place]   1 year ago Essential hypertension   [place] [facility], [place]   1 year ago Tendinitis of left shoulder   [place] [facility], [place], [place]   1 year ago Dysthymia   [place] [facility], [place]   1 year ago Atrial fibrillation with rapid ventricular response [facility])   [place] [facility], MD

## 2023-05-11 DIAGNOSIS — M7522 Bicipital tendinitis, left shoulder: Secondary | ICD-10-CM | POA: Diagnosis not present

## 2023-05-11 DIAGNOSIS — M7582 Other shoulder lesions, left shoulder: Secondary | ICD-10-CM | POA: Diagnosis not present

## 2023-05-11 DIAGNOSIS — S46012A Strain of muscle(s) and tendon(s) of the rotator cuff of left shoulder, initial encounter: Secondary | ICD-10-CM | POA: Diagnosis not present

## 2023-05-12 ENCOUNTER — Other Ambulatory Visit: Payer: Self-pay | Admitting: *Deleted

## 2023-05-12 ENCOUNTER — Other Ambulatory Visit: Payer: Self-pay | Admitting: Family Medicine

## 2023-05-12 DIAGNOSIS — I10 Essential (primary) hypertension: Secondary | ICD-10-CM

## 2023-05-12 DIAGNOSIS — J439 Emphysema, unspecified: Secondary | ICD-10-CM

## 2023-05-12 MED ORDER — ADVAIR HFA 45-21 MCG/ACT IN AERO
2.0000 | INHALATION_SPRAY | RESPIRATORY_TRACT | 3 refills | Status: DC | PRN
Start: 2023-05-12 — End: 2024-06-07

## 2023-05-12 MED ORDER — TRELEGY ELLIPTA 100-62.5-25 MCG/ACT IN AEPB
1.0000 | INHALATION_SPRAY | Freq: Every day | RESPIRATORY_TRACT | 5 refills | Status: DC
Start: 2023-05-12 — End: 2024-01-16

## 2023-05-12 NOTE — Telephone Encounter (Signed)
Requested Prescriptions  Refused Prescriptions Disp Refills   amLODipine (NORVASC) 5 MG tablet [Pharmacy Med Name: AMLODIPINE BESYLATE 5 MG TAB] 45 tablet 3    Sig: TAKE 1 TABLET (5 MG TOTAL) BY MOUTH EVERY OTHER DAY.     Cardiovascular: Calcium Channel Blockers 2 Failed - 05/12/2023  2:45 AM      Failed - Last BP in normal range    BP Readings from Last 1 Encounters:  04/17/23 (!) 140/100         Passed - Last Heart Rate in normal range    Pulse Readings from Last 1 Encounters:  04/17/23 78         Passed - Valid encounter within last 6 months    Recent Outpatient Visits           5 months ago Essential hypertension   [place] [facility], MD   1 year ago Essential hypertension   [place] [facility], [place]   1 year ago Tendinitis of left shoulder   [place] [facility], [place], [place]   1 year ago Dysthymia   [place] [facility], MD   1 year ago Atrial fibrillation with rapid ventricular response [facility])   [place] [facility], MD

## 2023-05-16 ENCOUNTER — Telehealth: Payer: Self-pay | Admitting: Pulmonary Disease

## 2023-05-16 DIAGNOSIS — M7582 Other shoulder lesions, left shoulder: Secondary | ICD-10-CM | POA: Diagnosis not present

## 2023-05-16 DIAGNOSIS — M7522 Bicipital tendinitis, left shoulder: Secondary | ICD-10-CM | POA: Diagnosis not present

## 2023-05-16 DIAGNOSIS — S46012A Strain of muscle(s) and tendon(s) of the rotator cuff of left shoulder, initial encounter: Secondary | ICD-10-CM | POA: Diagnosis not present

## 2023-05-16 NOTE — Telephone Encounter (Signed)
Pts PET was originally scheduled for 5/17. Pt's wife called and cancelled PET on 5/16 and informed staff they would call back to reschedule.   As of 5/28 pt has not rescheduled PET. Called and spoke to pt's wife as pt was not at home. Pt's wife, Ronald Lowe, is on pt's DPR. Informed her the pt needs to reschedule PET scan. Ronald Lowe states she was told by a physician, she is unsure of the name, that the "tiny spot on his lungs" did not need additional attention until 6 months from now. Kassie Mends of the recommendations per Dr. [name] per last OV. Pt needs PET now for concern of lung cancer. Ronald Lowe states she will give this information to the pt when he gets home to call our office to reschedule PET. Will hold in my box to follow back up on.

## 2023-05-18 DIAGNOSIS — S46012A Strain of muscle(s) and tendon(s) of the rotator cuff of left shoulder, initial encounter: Secondary | ICD-10-CM | POA: Diagnosis not present

## 2023-05-18 DIAGNOSIS — M7522 Bicipital tendinitis, left shoulder: Secondary | ICD-10-CM | POA: Diagnosis not present

## 2023-05-18 DIAGNOSIS — M7582 Other shoulder lesions, left shoulder: Secondary | ICD-10-CM | POA: Diagnosis not present

## 2023-05-19 ENCOUNTER — Other Ambulatory Visit: Payer: Self-pay | Admitting: Family Medicine

## 2023-05-22 NOTE — Telephone Encounter (Signed)
I rescheduled PET and made follow up appt with Sarah.  I spoke to pt & gave him appt info.

## 2023-05-22 NOTE — Telephone Encounter (Signed)
Pt nor wife have called back to get PET rescheduled. Called and spoke to pt. Discussed results and need for PET scan with pt. Pt is now agreeable to have PET. Pt is aware that [facility] will call [phone] to get PET rescheduled. Will forward to [facility] to schedule.

## 2023-05-24 ENCOUNTER — Telehealth: Payer: Self-pay | Admitting: Family Medicine

## 2023-05-24 DIAGNOSIS — M7522 Bicipital tendinitis, left shoulder: Secondary | ICD-10-CM | POA: Diagnosis not present

## 2023-05-24 DIAGNOSIS — M7582 Other shoulder lesions, left shoulder: Secondary | ICD-10-CM | POA: Diagnosis not present

## 2023-05-24 DIAGNOSIS — S46012A Strain of muscle(s) and tendon(s) of the rotator cuff of left shoulder, initial encounter: Secondary | ICD-10-CM | POA: Diagnosis not present

## 2023-05-24 NOTE — Telephone Encounter (Signed)
Eileen Stanford with [facility] calling to verify frequency of Advair inhaler. States 2 puffs as needed. Need order for how many times a day. Please advise pharmacy.

## 2023-05-24 NOTE — Telephone Encounter (Signed)
Ronald Lowe with [facility] asking if pt. Is supposed to use both Trelegy Ellipta and Advair inhalers. Please advise pharmacy.

## 2023-05-25 ENCOUNTER — Telehealth: Payer: Self-pay | Admitting: Family Medicine

## 2023-05-25 DIAGNOSIS — S46012A Strain of muscle(s) and tendon(s) of the rotator cuff of left shoulder, initial encounter: Secondary | ICD-10-CM | POA: Diagnosis not present

## 2023-05-25 DIAGNOSIS — M7522 Bicipital tendinitis, left shoulder: Secondary | ICD-10-CM | POA: Diagnosis not present

## 2023-05-25 DIAGNOSIS — M7582 Other shoulder lesions, left shoulder: Secondary | ICD-10-CM | POA: Diagnosis not present

## 2023-05-25 NOTE — Telephone Encounter (Signed)
Dawn with [facility] called or clarification of the two drugs Advair and Trelegy saying a patient is not usually on both prescriptions at one time.  If so please clarify the does.  CB#  [phone]

## 2023-05-25 NOTE — Telephone Encounter (Signed)
Danielle From [facility] called regarding rx's Advair and Trelegy.  Pharmacy is requesting a call back. Pt's are not usually on both of these medications.  Please advise.

## 2023-05-30 DIAGNOSIS — M7582 Other shoulder lesions, left shoulder: Secondary | ICD-10-CM | POA: Diagnosis not present

## 2023-05-30 DIAGNOSIS — S46012A Strain of muscle(s) and tendon(s) of the rotator cuff of left shoulder, initial encounter: Secondary | ICD-10-CM | POA: Diagnosis not present

## 2023-05-30 DIAGNOSIS — M7522 Bicipital tendinitis, left shoulder: Secondary | ICD-10-CM | POA: Diagnosis not present

## 2023-06-01 DIAGNOSIS — S46012A Strain of muscle(s) and tendon(s) of the rotator cuff of left shoulder, initial encounter: Secondary | ICD-10-CM | POA: Diagnosis not present

## 2023-06-01 DIAGNOSIS — M7522 Bicipital tendinitis, left shoulder: Secondary | ICD-10-CM | POA: Diagnosis not present

## 2023-06-01 DIAGNOSIS — M7582 Other shoulder lesions, left shoulder: Secondary | ICD-10-CM | POA: Diagnosis not present

## 2023-06-02 ENCOUNTER — Other Ambulatory Visit: Payer: Self-pay | Admitting: Family Medicine

## 2023-06-02 ENCOUNTER — Other Ambulatory Visit: Payer: Self-pay | Admitting: Cardiovascular Disease

## 2023-06-02 ENCOUNTER — Encounter (HOSPITAL_COMMUNITY)
Admission: RE | Admit: 2023-06-02 | Discharge: 2023-06-02 | Disposition: A | Discharge status: Home / Self Care | Payer: Medicare HMO | Source: Ambulatory Visit | Attending: Pulmonary Disease | Admitting: Pulmonary Disease

## 2023-06-02 DIAGNOSIS — R911 Solitary pulmonary nodule: Secondary | ICD-10-CM | POA: Insufficient documentation

## 2023-06-02 LAB — GLUCOSE, CAPILLARY: Glucose-Capillary: 93 mg/dL (ref 70–99)

## 2023-06-02 MED ORDER — FLUDEOXYGLUCOSE F - 18 (FDG) INJECTION
8.1000 | Freq: Once | INTRAVENOUS | Status: AC
  Administered 2023-06-02: 8.1 via INTRAVENOUS

## 2023-06-02 NOTE — Telephone Encounter (Signed)
Requested medication (s) are due for refill today:-  Requested medication (s) are on the active medication list: historical med  Last refill:  03/15/23  Future visit scheduled: no  Notes to clinic:  historical provider   Requested Prescriptions  Pending Prescriptions Disp Refills   famotidine (PEPCID) 40 MG tablet [Pharmacy Med Name: FAMOTIDINE 40 MG TABLET] 90 tablet 1    Sig: TAKE 1 TABLET BY MOUTH EVERY DAY     Gastroenterology:  H2 Antagonists Passed - 06/02/2023  2:35 AM      Passed - Valid encounter within last 12 months    Recent Outpatient Visits           5 months ago Essential hypertension   [place] [facility], MD   1 year ago Essential hypertension   [place] [facility], MD   1 year ago Tendinitis of left shoulder   [place] [facility] [place] [place], [place]   1 year ago Dysthymia   [place] [facility], MD   1 year ago Atrial fibrillation with rapid ventricular response [facility])   [place] [facility], MD

## 2023-06-05 ENCOUNTER — Telehealth: Payer: Self-pay | Admitting: Pulmonary Disease

## 2023-06-05 NOTE — Telephone Encounter (Signed)
Ronald Lowe would like telephone call for results. Not able to come into the office. Does not know how to do mychart. Ronald Lowe phone number is [phone].

## 2023-06-05 NOTE — Telephone Encounter (Signed)
Called and spoke with patient/wife (DPR), he was scheduled to see Kandice Robinsons NP tomorrow to review his PET scan, however, his wife will have their grandchild and she did not want to bring them into the office.  They cancelled this appointment and it is no longer available.  They do not do mychart and were wondering if he could be see in Wednesday or Friday afternoon or be called on the phone with the results.  Advised I would get a message to Kandice Robinsons and see if there was an option for him to be seen on either of those days in the afternoon.  Advised it would be tomorrow before we would be able to return their call.  She verbalized understanding.

## 2023-06-06 ENCOUNTER — Ambulatory Visit: Payer: Medicare HMO | Admitting: Acute Care

## 2023-06-06 NOTE — Telephone Encounter (Signed)
Spoke with Ronald Lowe and she said it was ok to schedule in first available in post procedural slot.  Scheduled patient for Friday July 12th at 11:30 am, to arrive at 11:15 am for check in.  Called and spoke with patient, scheduled for f/u on 06/30/23 at 11:30 am , advised to arrive by 11:15 am for check in.  Advised that PET scan has not been read at this time.  Advised we are limited at this time with appointment slots.  He verbalized understanding.  Nothing further needed.

## 2023-06-07 DIAGNOSIS — M7582 Other shoulder lesions, left shoulder: Secondary | ICD-10-CM | POA: Diagnosis not present

## 2023-06-07 DIAGNOSIS — S46012A Strain of muscle(s) and tendon(s) of the rotator cuff of left shoulder, initial encounter: Secondary | ICD-10-CM | POA: Diagnosis not present

## 2023-06-07 DIAGNOSIS — M7522 Bicipital tendinitis, left shoulder: Secondary | ICD-10-CM | POA: Diagnosis not present

## 2023-06-08 DIAGNOSIS — S46012A Strain of muscle(s) and tendon(s) of the rotator cuff of left shoulder, initial encounter: Secondary | ICD-10-CM | POA: Diagnosis not present

## 2023-06-08 DIAGNOSIS — M7582 Other shoulder lesions, left shoulder: Secondary | ICD-10-CM | POA: Diagnosis not present

## 2023-06-08 DIAGNOSIS — M7522 Bicipital tendinitis, left shoulder: Secondary | ICD-10-CM | POA: Diagnosis not present

## 2023-06-13 DIAGNOSIS — S46012A Strain of muscle(s) and tendon(s) of the rotator cuff of left shoulder, initial encounter: Secondary | ICD-10-CM | POA: Diagnosis not present

## 2023-06-13 DIAGNOSIS — M7522 Bicipital tendinitis, left shoulder: Secondary | ICD-10-CM | POA: Diagnosis not present

## 2023-06-13 DIAGNOSIS — M7582 Other shoulder lesions, left shoulder: Secondary | ICD-10-CM | POA: Diagnosis not present

## 2023-06-15 DIAGNOSIS — M7582 Other shoulder lesions, left shoulder: Secondary | ICD-10-CM | POA: Diagnosis not present

## 2023-06-15 DIAGNOSIS — S46012A Strain of muscle(s) and tendon(s) of the rotator cuff of left shoulder, initial encounter: Secondary | ICD-10-CM | POA: Diagnosis not present

## 2023-06-15 DIAGNOSIS — M7522 Bicipital tendinitis, left shoulder: Secondary | ICD-10-CM | POA: Diagnosis not present

## 2023-06-20 ENCOUNTER — Ambulatory Visit: Payer: Self-pay

## 2023-06-20 NOTE — Patient Outreach (Signed)
  Care Coordination   Follow Up Visit Note   06/20/2023 Name: Ronald Lowe MRN: [id number] DOB: Apr 24, 1956  Ronald Lowe is a 67 y.o. year old male who sees [name], MD for primary care. I spoke with  [name] by phone today.  What matters to the patients health and wellness today?  Hypertension Management    Goals Addressed             This Visit's Progress    Hypertension Management       Care Coordination Interventions: Evaluation of current treatment plan related to hypertension self management and patient's adherence to plan as established by provider Reviewed medications with patient and discussed importance of compliance Discussed plans with patient for ongoing care management follow up and provided patient with direct contact information for care management team  Patient blood pressure last check 120/90 Recent left rotator cuff surgery.  Patient reports doing well.  Therapy continues.         SDOH assessments and interventions completed:  Yes  SDOH Interventions Today    Flowsheet Row Most Recent Value  SDOH Interventions   Housing Interventions Intervention Not Indicated  Transportation Interventions Intervention Not Indicated        Care Coordination Interventions:  Yes, provided   Follow up plan: Follow up call scheduled for October    Encounter Outcome:  Pt. Visit Completed   Bary Leriche, RN, MSN [facility] Care Management Care Management Coordinator Direct Line [phone]

## 2023-06-20 NOTE — Patient Instructions (Signed)
Visit Information  Thank you for taking time to visit with me today. Please don't hesitate to contact me if I can be of assistance to you.   Following are the goals we discussed today:   Goals Addressed             This Visit's Progress    Hypertension Management       Care Coordination Interventions: Evaluation of current treatment plan related to hypertension self management and patient's adherence to plan as established by provider Reviewed medications with patient and discussed importance of compliance Discussed plans with patient for ongoing care management follow up and provided patient with direct contact information for care management team  Patient blood pressure last check 120/90 Recent left rotator cuff surgery.  Patient reports doing well.  Therapy continues.         Our next appointment is by telephone on 09/20/23 at 1030  Please call the care guide team at [phone] if you need to cancel or reschedule your appointment.   If you are experiencing a Mental Health or Behavioral Health Crisis or need someone to talk to, please call the Suicide and Crisis Lifeline: 988   Patient verbalizes understanding of instructions and care plan provided today and agrees to view in MyChart. Active MyChart status and patient understanding of how to access instructions and care plan via MyChart confirmed with patient.     The patient has been provided with contact information for the care management team and has been advised to call with any health related questions or concerns.   Bary Leriche, RN, MSN [facility] Care Management Care Management Coordinator Direct Line [phone]

## 2023-06-27 DIAGNOSIS — S46012A Strain of muscle(s) and tendon(s) of the rotator cuff of left shoulder, initial encounter: Secondary | ICD-10-CM | POA: Diagnosis not present

## 2023-06-27 DIAGNOSIS — M7582 Other shoulder lesions, left shoulder: Secondary | ICD-10-CM | POA: Diagnosis not present

## 2023-06-27 DIAGNOSIS — M7522 Bicipital tendinitis, left shoulder: Secondary | ICD-10-CM | POA: Diagnosis not present

## 2023-06-29 DIAGNOSIS — M7522 Bicipital tendinitis, left shoulder: Secondary | ICD-10-CM | POA: Diagnosis not present

## 2023-06-29 DIAGNOSIS — M7582 Other shoulder lesions, left shoulder: Secondary | ICD-10-CM | POA: Diagnosis not present

## 2023-06-29 DIAGNOSIS — S46012A Strain of muscle(s) and tendon(s) of the rotator cuff of left shoulder, initial encounter: Secondary | ICD-10-CM | POA: Diagnosis not present

## 2023-06-30 ENCOUNTER — Ambulatory Visit: Payer: Medicare HMO | Admitting: Acute Care

## 2023-06-30 ENCOUNTER — Encounter: Payer: Self-pay | Admitting: Acute Care

## 2023-06-30 VITALS — BP 140/90 | HR 79 | Ht 72.0 in | Wt 163.0 lb

## 2023-06-30 DIAGNOSIS — R911 Solitary pulmonary nodule: Secondary | ICD-10-CM

## 2023-06-30 DIAGNOSIS — F17200 Nicotine dependence, unspecified, uncomplicated: Secondary | ICD-10-CM

## 2023-06-30 NOTE — Patient Instructions (Addendum)
It is good to see you today. Your PET scan was reassuring , and shows that the lung nodule has resolved, and we think this was an infectious or inflammatory finding. Plan is for a 6 month follow up low dose Ct Chest.  If this is normal, we can return to the annual once a year screening scan.  This will be January 2025.  You will get a call to schedule this closer to the time.  Keep working on quitting smoking.  Call 1-800-Quit Now for free nicotine patches gum or mints Follow up with Ronald Sago NP or Ronald Lowe after CT Chest in 6 months.  Please contact office for sooner follow up if symptoms do not improve or worsen or seek emergency care

## 2023-06-30 NOTE — Progress Notes (Signed)
History of Present Illness Ronald Lowe is a 67 y.o. male current every day smoker with past medical history of COPD, previous and current  alcohol use, gastroesophageal reflux, hypertension, and hyperlipidemia. He was referred in April 2024 for abnormal screening  pulmonary nodule by [name], MD   Synopsis 67 year old gentleman, past medical history of COPD on triple therapy inhaler regimen with Trelegy. He also has daily cough and sputum production. , previous and current  alcohol use, gastroesophageal reflux, hypertension, hyperlipidemia. He still smokes and uses alcohol. He had a CT scan of the chest for lung cancer screening that showed a right upper lobe inflammatory appearing lung nodule that was new in comparison to previous imaging last year. PET scan was ordered 03/2025 but not done until 06/2025 to better evaluate the nodule. He presents today for follow up and review of findings.    06/30/2023 Pt presents for follow up after PET scan to better evaluate RML nodule of concern on recent Screening CT Chest. He states he has been doing well. He is continuing to smoke.  PET scan shows near resolution of the nodule of concern.  This leans more toward an infectious or inflammatory etiology. Plan is for a 6 month follow up low diose Ct Chest, and then if this is a stable scan, he will return to annual lung cancer screening. Both patient and his wife are in agreement with this plan.  I did counsel patient to quit smoking. He is down to 5 cigarettes daily. He did not take the Wellbutrin Dr. [name] ordered.    Test Results: 06/02/2023 PET scan  Chest Prior subpleural right middle lobe nodule is not discretely visualized. Mild residual post infectious/inflammatory scarring is possible, noting motion degradation. No hypermetabolic pulmonary nodules.   No hypermetabolic thoracic lymphadenopathy.  Low Dose CT Chest New aggressive appearing spiculated nodule in the right middle lobe  categorized as Lung-RADS 4BS, suspicious. Additional imaging evaluation or consultation with Pulmonology or Thoracic Surgery recommended. 2. The "S" modifier above refers to potentially clinically significant non lung cancer related findings. Specifically, there is aortic atherosclerosis, in addition to left main and three-vessel coronary artery disease. Please note that although the presence of coronary artery calcium documents the presence of coronary artery disease, the severity of this disease and any potential stenosis cannot be assessed on this non-gated CT examination. Assessment for potential risk factor modification, dietary therapy or pharmacologic therapy may be warranted, if clinically indicated. 3. Mild diffuse bronchial wall thickening with mild centrilobular and paraseptal emphysema; imaging findings suggestive of underlying COPD. 4. There are calcifications of the aortic valve. Echocardiographic correlation for evaluation of potential valvular dysfunction may be warranted if clinically indicated.       Latest Ref Rng & Units 11/07/2022    1:06 PM 07/01/2022   10:39 AM 11/17/2021   12:30 AM  CBC  WBC 4.0 - 10.5 K/uL 5.3  5.6  6.6   Hemoglobin 13.0 - 17.0 g/dL 78.2  95.6  21.3   Hematocrit 39.0 - 52.0 % 47.9  45.1  45.4   Platelets 150 - 400 K/uL 223  280  217        Latest Ref Rng & Units 11/07/2022    1:06 PM 07/01/2022   10:39 AM 11/17/2021   12:30 AM  BMP  Glucose 70 - 99 mg/dL 92  93  086   BUN 8 - 23 mg/dL 14  7  15    Creatinine 0.61 - 1.24 mg/dL 5.78  0.94  1.23   BUN/Creat Ratio 10 - 24  7    Sodium 135 - 145 mmol/L 139  133  133   Potassium 3.5 - 5.1 mmol/L 3.9  4.2  3.9   Chloride 98 - 111 mmol/L 103  96  99   CO2 22 - 32 mmol/L 27  25  22    Calcium 8.9 - 10.3 mg/dL 9.0  9.3  8.5     BNP No results found for: "BNP"  ProBNP No results found for: "PROBNP"  PFT No results found for: "FEV1PRE", "FEV1POST", "FVCPRE", "FVCPOST", "TLC",  "DLCOUNC", "PREFEV1FVCRT", "PSTFEV1FVCRT"  NM PET Image Initial (PI) Skull Base To Thigh (F-18 FDG)  Result Date: 06/08/2023 CLINICAL DATA:  Initial treatment strategy for right middle lobe nodule. EXAM: NUCLEAR MEDICINE PET SKULL BASE TO THIGH TECHNIQUE: 8.1 mCi F-18 FDG was injected intravenously. Full-ring PET imaging was performed from the skull base to thigh after the radiotracer. CT data was obtained and used for attenuation correction and anatomic localization. Fasting blood glucose: 93 mg/dl COMPARISON:  Low-dose lung cancer screening CT chest dated 04/06/2023 FINDINGS: Mediastinal blood pool activity: SUV max 2.6 Liver activity: SUV max NA NECK: No hypermetabolic cervical lymphadenopathy. Incidental CT findings: None. CHEST: Prior subpleural right middle lobe nodule is not discretely visualized. Mild residual post infectious/inflammatory scarring is possible, noting motion degradation. No hypermetabolic pulmonary nodules. No hypermetabolic thoracic lymphadenopathy. Incidental CT findings: Atherosclerotic calcifications of the aortic arch. Moderate three-vessel coronary atherosclerosis. Mild centrilobular and paraseptal emphysematous changes. Mild linear scarring in the bilateral lower lobes. ABDOMEN/PELVIS: No abnormal hypermetabolism in the liver, spleen, pancreas, or adrenal glands. No hypermetabolic abdominopelvic lymphadenopathy. Incidental CT findings: 3 mm right parenchymal renal calcification. Atherosclerotic calcifications of the abdominal aorta and branch vessels. SKELETON: No focal hypermetabolic activity to suggest skeletal metastasis. Incidental CT findings: Degenerative changes of the visualized thoracolumbar spine. IMPRESSION: Prior subpleural right middle lobe nodule is not discretely visualized, likely infectious/inflammatory. No findings suspicious for malignancy. Electronically Signed   By: Charline Bills M.D.   On: 06/08/2023 03:05     Past medical hx Past Medical History:   Diagnosis Date   Aortic atherosclerosis (HCC)    Cervical spinal stenosis    Chronic kidney disease    COPD (chronic obstructive pulmonary disease) (HCC)    Coronary artery calcification seen on CT scan    ETOH abuse    a.) 28+ standard drinks/week   GERD (gastroesophageal reflux disease)    History of amiodarone therapy    a.) in the past; self discontinued   History of medication noncompliance    a.) reported 10/2022 that he has been off NOAC for several months secondary to vertiginous symptoms, not acting like himself, and depression; b.) off/not taking as prescribed: antiarrhythmics, antihypertensives, statins, and MDIs   Hyperlipidemia    Hypertension    Long term current use of anticoagulant    a.) rivaroxaban   PAF (paroxysmal atrial fibrillation) (HCC)    a.) CHA2DS2VASc = 3 (age, HTN, vascular disease history);  b.) rate/rhythm maintained on oral carvediolol (self discontinued diltiazem + amiodarone + metoprolol); chronically anticoagulated with rivaroxaban (switched from apixaban 11/17/2022)   Tobacco abuse      Social History   Tobacco Use   Smoking status: Every Day    Current packs/day: 0.25    Average packs/day: 0.3 packs/day for 52.5 years (13.1 ttl pk-yrs)    Types: Cigarettes    Start date: 12/19/1970   Smokeless tobacco: Former   Tobacco comments:  Smokes 3 packs of cigarettes a week. 06/30/23 Tay  Vaping Use   Vaping status: Never Used  Substance Use Topics   Alcohol use: Yes    Alcohol/week: 28.0 standard drinks of alcohol    Types: 14 Cans of beer, 14 Shots of liquor per week   Drug use: Yes    Types: Marijuana    Comment: + cocaine UDS 06/16/2021    Mr.Fairley reports that he has been smoking cigarettes. He started smoking about 52 years ago. He has a 13.1 pack-year smoking history. He has quit using smokeless tobacco. He reports current alcohol use of about 28.0 standard drinks of alcohol per week. He reports current drug use. Drug:  Marijuana.  Tobacco Cessation: Ready to quit: Not Answered Counseling given: Not Answered Tobacco comments: Smokes 3 packs of cigarettes a week. 06/30/23 Tay Current every day smoker with a 50 + pack year smoking history Smoked up to 3 PPD per his wife, now down to 1/4 PPD.   Past surgical hx, Family hx, Social hx all reviewed.  Current Outpatient Medications on File Prior to Visit  Medication Sig   ADVAIR HFA 45-21 MCG/ACT inhaler Inhale 2 puffs into the lungs as needed (wheezing).   albuterol (VENTOLIN HFA) 108 (90 Base) MCG/ACT inhaler INHALE 2 PUFFS INTO THE LUNGS EVERY 4 HOURS AS NEEDED FOR WHEEZING OR SHORTNESS OF BREATH.   amiodarone (PACERONE) 200 MG tablet Take 200 mg by mouth daily.   atorvastatin (LIPITOR) 40 MG tablet Take 40 mg by mouth daily.   buPROPion (WELLBUTRIN SR) 150 MG 12 hr tablet Take 1 tablet (150 mg total) by mouth 2 (two) times daily. Start one pill once per day for 3 days then increase to twice daily   carvedilol (COREG) 3.125 MG tablet Take 1 tablet (3.125 mg total) by mouth 2 (two) times daily.   diltiazem (CARDIZEM CD) 240 MG 24 hr capsule Take 1 capsule (240 mg total) by mouth daily.   famotidine (PEPCID) 40 MG tablet TAKE 1 TABLET BY MOUTH EVERY DAY   lisinopril (ZESTRIL) 5 MG tablet TAKE 1 TABLET (5 MG TOTAL) BY MOUTH DAILY.   oxycodone (OXY-IR) 5 MG capsule Take 10 mg by mouth every 4 (four) hours as needed for pain.   pantoprazole (PROTONIX) 40 MG tablet Take 1 tablet (40 mg total) by mouth daily as needed.   TRELEGY ELLIPTA 100-62.5-25 MCG/ACT AEPB Take 1 puff by mouth daily.   No current facility-administered medications on file prior to visit.     No Known Allergies  Review Of Systems:  Constitutional:   No  weight loss, night sweats,  Fevers, chills, fatigue, or  lassitude.  HEENT:   No headaches,  Difficulty swallowing,  Tooth/dental problems, or  Sore throat,                No sneezing, itching, ear ache, nasal congestion, post nasal drip,    CV:  No chest pain,  Orthopnea, PND, swelling in lower extremities, anasarca, dizziness, palpitations, syncope.   GI  No heartburn, indigestion, abdominal pain, nausea, vomiting, diarrhea, change in bowel habits, loss of appetite, bloody stools.   Resp: No shortness of breath with exertion or at rest.  No excess mucus, no productive cough,  No non-productive cough,  No coughing up of blood.  No change in color of mucus.  No wheezing.  No chest wall deformity  Skin: no rash or lesions.  GU: no dysuria, change in color of urine, no urgency or frequency.  No flank pain, no hematuria   MS:  No joint pain or swelling.  No decreased range of motion.  No back pain.  Psych:  No change in mood or affect. No depression or anxiety.  No memory loss.   Vital Signs BP (!) 140/90 (BP Location: Left Arm)   Pulse 79   Ht 6' (1.829 m)   Wt 163 lb (73.9 kg)   SpO2 96%   BMI 22.11 kg/m    Physical Exam:  General- No distress,  A&Ox3, pleasant ENT: No sinus tenderness, TM clear, pale nasal mucosa, no oral exudate,no post nasal drip, no LAN Cardiac: S1, S2, regular rate and rhythm, no murmur Chest: No wheeze/ rales/ dullness; no accessory muscle use, no nasal flaring, no sternal retractions Abd.: Soft Non-tender, ND, BS +, Body mass index is 22.11 kg/m. Ext: No clubbing cyanosis, edema Neuro:  normal strength, MAE x 4, A&O x 3, appropriate Skin: No rashes, warm and dry, no lesions  Psych: normal mood and behavior   Assessment/Plan Lung RADS 4 B Screening scan , Aggressive appearing spiculated nodule in the right middle lobe  PET scan shows resolution of nodule, leaning toward an infectious/ inflammatory etiology Plan Your PET scan was reassuring , and shows that the lung nodule has resolved, and we think this was an infectious or inflammatory finding. Plan is for a 6 month follow up low dose Ct Chest.  If this is normal, we can return to the annual once a year screening scan.  This will  be January 2025.  You will get a call to schedule this closer to the time.  Keep working on quitting smoking.  Call 1-800-Quit Now for free nicotine patches gum or mints Follow up with Maralyn Sago NP or Dr. Celine Mans after CT Chest in 6 months.  Please contact office for sooner follow up if symptoms do not improve or worsen or seek emergency care    I spent 30 minutes dedicated to the care of this patient on the date of this encounter to include pre-visit review of records, face-to-face time with the patient discussing conditions above, post visit ordering of testing, clinical documentation with the electronic health record, making appropriate referrals as documented, and communicating necessary information to the patient's healthcare team.   Bevelyn Ngo, NP 06/30/2023  11:49 AM

## 2023-07-04 DIAGNOSIS — M7582 Other shoulder lesions, left shoulder: Secondary | ICD-10-CM | POA: Diagnosis not present

## 2023-07-04 DIAGNOSIS — M7522 Bicipital tendinitis, left shoulder: Secondary | ICD-10-CM | POA: Diagnosis not present

## 2023-07-04 DIAGNOSIS — S46012A Strain of muscle(s) and tendon(s) of the rotator cuff of left shoulder, initial encounter: Secondary | ICD-10-CM | POA: Diagnosis not present

## 2023-07-06 DIAGNOSIS — M7582 Other shoulder lesions, left shoulder: Secondary | ICD-10-CM | POA: Diagnosis not present

## 2023-07-06 DIAGNOSIS — S46012A Strain of muscle(s) and tendon(s) of the rotator cuff of left shoulder, initial encounter: Secondary | ICD-10-CM | POA: Diagnosis not present

## 2023-07-06 DIAGNOSIS — M7522 Bicipital tendinitis, left shoulder: Secondary | ICD-10-CM | POA: Diagnosis not present

## 2023-07-07 DIAGNOSIS — M7522 Bicipital tendinitis, left shoulder: Secondary | ICD-10-CM | POA: Diagnosis not present

## 2023-07-07 DIAGNOSIS — M7582 Other shoulder lesions, left shoulder: Secondary | ICD-10-CM | POA: Diagnosis not present

## 2023-07-07 DIAGNOSIS — Z9889 Other specified postprocedural states: Secondary | ICD-10-CM | POA: Diagnosis not present

## 2023-07-07 DIAGNOSIS — M75122 Complete rotator cuff tear or rupture of left shoulder, not specified as traumatic: Secondary | ICD-10-CM | POA: Diagnosis not present

## 2023-07-11 DIAGNOSIS — M7522 Bicipital tendinitis, left shoulder: Secondary | ICD-10-CM | POA: Diagnosis not present

## 2023-07-11 DIAGNOSIS — S46012A Strain of muscle(s) and tendon(s) of the rotator cuff of left shoulder, initial encounter: Secondary | ICD-10-CM | POA: Diagnosis not present

## 2023-07-11 DIAGNOSIS — M7582 Other shoulder lesions, left shoulder: Secondary | ICD-10-CM | POA: Diagnosis not present

## 2023-07-13 DIAGNOSIS — S46012A Strain of muscle(s) and tendon(s) of the rotator cuff of left shoulder, initial encounter: Secondary | ICD-10-CM | POA: Diagnosis not present

## 2023-07-13 DIAGNOSIS — M7582 Other shoulder lesions, left shoulder: Secondary | ICD-10-CM | POA: Diagnosis not present

## 2023-07-13 DIAGNOSIS — M7522 Bicipital tendinitis, left shoulder: Secondary | ICD-10-CM | POA: Diagnosis not present

## 2023-07-18 DIAGNOSIS — M7582 Other shoulder lesions, left shoulder: Secondary | ICD-10-CM | POA: Diagnosis not present

## 2023-07-18 DIAGNOSIS — M7522 Bicipital tendinitis, left shoulder: Secondary | ICD-10-CM | POA: Diagnosis not present

## 2023-07-18 DIAGNOSIS — S46012A Strain of muscle(s) and tendon(s) of the rotator cuff of left shoulder, initial encounter: Secondary | ICD-10-CM | POA: Diagnosis not present

## 2023-07-25 DIAGNOSIS — S46012A Strain of muscle(s) and tendon(s) of the rotator cuff of left shoulder, initial encounter: Secondary | ICD-10-CM | POA: Diagnosis not present

## 2023-07-25 DIAGNOSIS — M7522 Bicipital tendinitis, left shoulder: Secondary | ICD-10-CM | POA: Diagnosis not present

## 2023-07-25 DIAGNOSIS — M7582 Other shoulder lesions, left shoulder: Secondary | ICD-10-CM | POA: Diagnosis not present

## 2023-07-27 DIAGNOSIS — M7582 Other shoulder lesions, left shoulder: Secondary | ICD-10-CM | POA: Diagnosis not present

## 2023-07-27 DIAGNOSIS — M7522 Bicipital tendinitis, left shoulder: Secondary | ICD-10-CM | POA: Diagnosis not present

## 2023-07-27 DIAGNOSIS — S46012A Strain of muscle(s) and tendon(s) of the rotator cuff of left shoulder, initial encounter: Secondary | ICD-10-CM | POA: Diagnosis not present

## 2023-07-31 ENCOUNTER — Other Ambulatory Visit: Payer: Self-pay | Admitting: Pulmonary Disease

## 2023-08-01 DIAGNOSIS — M7522 Bicipital tendinitis, left shoulder: Secondary | ICD-10-CM | POA: Diagnosis not present

## 2023-08-01 DIAGNOSIS — S46012A Strain of muscle(s) and tendon(s) of the rotator cuff of left shoulder, initial encounter: Secondary | ICD-10-CM | POA: Diagnosis not present

## 2023-08-01 DIAGNOSIS — M7582 Other shoulder lesions, left shoulder: Secondary | ICD-10-CM | POA: Diagnosis not present

## 2023-08-04 DIAGNOSIS — M7522 Bicipital tendinitis, left shoulder: Secondary | ICD-10-CM | POA: Diagnosis not present

## 2023-08-04 DIAGNOSIS — S46012A Strain of muscle(s) and tendon(s) of the rotator cuff of left shoulder, initial encounter: Secondary | ICD-10-CM | POA: Diagnosis not present

## 2023-08-04 DIAGNOSIS — M7582 Other shoulder lesions, left shoulder: Secondary | ICD-10-CM | POA: Diagnosis not present

## 2023-08-07 DIAGNOSIS — M7522 Bicipital tendinitis, left shoulder: Secondary | ICD-10-CM | POA: Diagnosis not present

## 2023-08-07 DIAGNOSIS — S46012A Strain of muscle(s) and tendon(s) of the rotator cuff of left shoulder, initial encounter: Secondary | ICD-10-CM | POA: Diagnosis not present

## 2023-08-07 DIAGNOSIS — M7582 Other shoulder lesions, left shoulder: Secondary | ICD-10-CM | POA: Diagnosis not present

## 2023-08-15 DIAGNOSIS — S46012A Strain of muscle(s) and tendon(s) of the rotator cuff of left shoulder, initial encounter: Secondary | ICD-10-CM | POA: Diagnosis not present

## 2023-08-15 DIAGNOSIS — M7582 Other shoulder lesions, left shoulder: Secondary | ICD-10-CM | POA: Diagnosis not present

## 2023-08-15 DIAGNOSIS — M7522 Bicipital tendinitis, left shoulder: Secondary | ICD-10-CM | POA: Diagnosis not present

## 2023-08-25 ENCOUNTER — Other Ambulatory Visit: Payer: Self-pay | Admitting: Pulmonary Disease

## 2023-09-20 ENCOUNTER — Ambulatory Visit: Payer: Self-pay

## 2023-09-20 NOTE — Patient Outreach (Signed)
Care Coordination   Follow Up Visit Note   09/20/2023 Name: Ronald Lowe MRN: [id number] DOB: [birth year]  Ronald Lowe is a 67 y.o. year old male who sees [name], MD for primary care. I spoke with  Ronald Lowe by phone today.  What matters to the patients health and wellness today?  Health maintenance    Goals Addressed             This Visit's Progress    Hypertension Management       Care Coordination Interventions: Evaluation of current treatment plan related to hypertension self management and patient's adherence to plan as established by provider Reviewed medications with patient and discussed importance of compliance Discussed plans with patient for ongoing care management follow up and provided patient with direct contact information for care management team  Patient Goals/Self Care Activities: -Calls provider office for new concerns, questions, or BP outside discussed parameters -Checks BP and records as discussed -Follows a low sodium diet/DASH diet    Patient reports doing well.  Has follow up with surgeon about left shoulder.  Therapy complete.  He reports he can move arm better.  Blood pressure less than 140/80.  Discussed HTN Management and low salt diet.  No concerns.         SDOH assessments and interventions completed:  Yes     Care Coordination Interventions:  Yes, provided   Follow up plan: Follow up call scheduled for January    Encounter Outcome:  Patient Visit Completed   Bary Leriche, RN, MSN [place]  [facility], [facility] Management Community Coordinator Direct Dial: [phone]  Fax: [phone] Website: [email or web address]

## 2023-09-20 NOTE — Patient Instructions (Signed)
Visit Information  Thank you for taking time to visit with me today. Please don't hesitate to contact me if I can be of assistance to you.   Following are the goals we discussed today:   Goals Addressed             This Visit's Progress    Hypertension Management       Care Coordination Interventions: Evaluation of current treatment plan related to hypertension self management and patient's adherence to plan as established by provider Reviewed medications with patient and discussed importance of compliance Discussed plans with patient for ongoing care management follow up and provided patient with direct contact information for care management team  Patient Goals/Self Care Activities: -Calls provider office for new concerns, questions, or BP outside discussed parameters -Checks BP and records as discussed -Follows a low sodium diet/DASH diet    Patient reports doing well.  Has follow up with surgeon about left shoulder.  Therapy complete.  He reports he can move arm better.  Blood pressure less than 140/80.  Discussed HTN Management and low salt diet.  No concerns.         Our next appointment is by telephone on 12/21/23 at 1030 am  Please call the care guide team at [phone] if you need to cancel or reschedule your appointment.   If you are experiencing a Mental Health or Behavioral Health Crisis or need someone to talk to, please call the Suicide and Crisis Lifeline: 988   Patient verbalizes understanding of instructions and care plan provided today and agrees to view in MyChart. Active MyChart status and patient understanding of how to access instructions and care plan via MyChart confirmed with patient.     The patient has been provided with contact information for the care management team and has been advised to call with any health related questions or concerns.   Bary Leriche, RN, MSN [facility], [facility] Management  Community Coordinator Direct Dial: [phone]  Fax: [phone] Website: [email or web address]

## 2023-09-29 ENCOUNTER — Ambulatory Visit: Payer: Medicare HMO

## 2023-09-29 DIAGNOSIS — Z0000 Encounter for general adult medical examination without abnormal findings: Secondary | ICD-10-CM | POA: Diagnosis not present

## 2023-09-29 NOTE — Progress Notes (Signed)
Subjective:   Ronald Lowe is a 67 y.o. male who presents for Medicare Annual/Subsequent preventive examination.  Visit Complete: Virtual I connected with  Ronald Lowe on 09/29/23 by a audio enabled telemedicine application and verified that I am speaking with the correct person using two identifiers.  Patient Location: Home  Provider Location: Office/Clinic  I discussed the limitations of evaluation and management by telemedicine. The patient expressed understanding and agreed to proceed.  Vital Signs: Because this visit was a virtual/telehealth visit, some criteria may be missing or patient reported. Any vitals not documented were not able to be obtained and vitals that have been documented are patient reported.    Cardiac Risk Factors include: advanced age (>80men, >98 women);hypertension;male gender     Objective:    Today's Vitals   There is no height or weight on file to calculate BMI.     09/29/2023    2:21 PM 02/02/2023   11:21 AM 01/31/2023   12:46 PM 12/07/2022    6:25 AM 12/01/2022    3:16 PM 11/07/2022   10:46 AM 07/11/2022   10:46 AM  Advanced Directives  Does Patient Have a Medical Advance Directive? No No No No No No No  Would patient like information on creating a medical advance directive?  No - Patient declined  No - Patient declined No - Patient declined  No - Patient declined    Current Medications (verified) Outpatient Encounter Medications as of 09/29/2023  Medication Sig   ADVAIR HFA 45-21 MCG/ACT inhaler Inhale 2 puffs into the lungs as needed (wheezing).   albuterol (VENTOLIN HFA) 108 (90 Base) MCG/ACT inhaler INHALE 2 PUFFS INTO THE LUNGS EVERY 4 HOURS AS NEEDED FOR WHEEZING OR SHORTNESS OF BREATH.   amiodarone (PACERONE) 200 MG tablet Take 200 mg by mouth daily.   atorvastatin (LIPITOR) 40 MG tablet Take 40 mg by mouth daily.   buPROPion (WELLBUTRIN SR) 150 MG 12 hr tablet TAKE 1 TABLET (150 MG TOTAL) BY MOUTH 2 (TWO) TIMES DAILY. START  ONE PILL ONCE PER DAY FOR 3 DAYS THEN INCREASE TO TWICE DAILY   carvedilol (COREG) 3.125 MG tablet Take 1 tablet (3.125 mg total) by mouth 2 (two) times daily.   diltiazem (CARDIZEM CD) 240 MG 24 hr capsule Take 1 capsule (240 mg total) by mouth daily.   famotidine (PEPCID) 40 MG tablet TAKE 1 TABLET BY MOUTH EVERY DAY   lisinopril (ZESTRIL) 5 MG tablet TAKE 1 TABLET (5 MG TOTAL) BY MOUTH DAILY.   oxycodone (OXY-IR) 5 MG capsule Take 10 mg by mouth every 4 (four) hours as needed for pain.   pantoprazole (PROTONIX) 40 MG tablet Take 1 tablet (40 mg total) by mouth daily as needed.   TRELEGY ELLIPTA 100-62.5-25 MCG/ACT AEPB Take 1 puff by mouth daily.   No facility-administered encounter medications on file as of 09/29/2023.    Allergies (verified) Patient has no known allergies.   History: Past Medical History:  Diagnosis Date   Aortic atherosclerosis (HCC)    Cervical spinal stenosis    Chronic kidney disease    COPD (chronic obstructive pulmonary disease) (HCC)    Coronary artery calcification seen on CT scan    ETOH abuse    a.) 28+ standard drinks/week   GERD (gastroesophageal reflux disease)    History of amiodarone therapy    a.) in the past; self discontinued   History of medication noncompliance    a.) reported 10/2022 that he has been off NOAC for  several months secondary to vertiginous symptoms, not acting like himself, and depression; b.) off/not taking as prescribed: antiarrhythmics, antihypertensives, statins, and MDIs   Hyperlipidemia    Hypertension    Long term current use of anticoagulant    a.) rivaroxaban   PAF (paroxysmal atrial fibrillation) (HCC)    a.) CHA2DS2VASc = 3 (age, HTN, vascular disease history);  b.) rate/rhythm maintained on oral carvediolol (self discontinued diltiazem + amiodarone + metoprolol); chronically anticoagulated with rivaroxaban (switched from apixaban 11/17/2022)   Tobacco abuse    Past Surgical History:  Procedure Laterality Date    SHOULDER ARTHROSCOPY WITH SUBACROMIAL DECOMPRESSION, ROTATOR CUFF REPAIR AND BICEP TENDON REPAIR Left 12/07/2022   Procedure: SHOULDER ARTHROSCOPY WITH DEBRIDEMENT, DECOMPRESSION, ROTATOR CUFF REPAIR AND BICEPS TENODESIS.- RNFA;  Surgeon: Christena Flake, MD;  Location: [facility];  Service: Orthopedics;  Laterality: Left;   SHOULDER ARTHROSCOPY WITH SUBACROMIAL DECOMPRESSION, ROTATOR CUFF REPAIR AND BICEP TENDON REPAIR Left 02/02/2023   Procedure: LEFT SHOULDER ARTHROSCOPY WITH DEBRIDEMENT AND REPAIR OF RECURRENT ROTATOR CUFF TEAR OF LEFT SHOULDER;  Surgeon: Christena Flake, MD;  Location: [facility];  Service: Orthopedics;  Laterality: Left;   TONSILLECTOMY     Family History  Problem Relation Age of Onset   Emphysema Father    CAD Father        CABG   Stroke Mother    CAD Brother 32       CABG   Hypertension Brother    Social History   Socioeconomic History   Marital status: Married    Spouse name: Bertram Denver   Number of children: 2   Years of education: Not on file   Highest education level: Not on file  Occupational History   Occupation: retired  Tobacco Use   Smoking status: Every Day    Current packs/day: 0.25    Average packs/day: 0.3 packs/day for 52.8 years (13.2 ttl pk-yrs)    Types: Cigarettes    Start date: 12/19/1970   Smokeless tobacco: Former   Tobacco comments:    Smokes 3 packs of cigarettes a week. 06/30/23 Tay  Vaping Use   Vaping status: Never Used  Substance and Sexual Activity   Alcohol use: Yes    Alcohol/week: 28.0 standard drinks of alcohol    Types: 14 Cans of beer, 14 Shots of liquor per week   Drug use: Yes    Types: Marijuana    Comment: + cocaine UDS 06/16/2021   Sexual activity: Yes    Partners: Female    Birth control/protection: None  Other Topics Concern   Not on file  Social History Narrative   Lives with wife.   Social Determinants of Health   Financial Resource Strain: Low Risk  (09/29/2023)   Overall Financial Resource Strain  (CARDIA)    Difficulty of Paying Living Expenses: Not hard at all  Food Insecurity: No Food Insecurity (09/29/2023)   Hunger Vital Sign    Worried About Running Out of Food in the Last Year: Never true    Ran Out of Food in the Last Year: Never true  Transportation Needs: No Transportation Needs (09/29/2023)   PRAPARE - Administrator, Civil Service (Medical): No    Lack of Transportation (Non-Medical): No  Physical Activity: Inactive (09/29/2023)   Exercise Vital Sign    Days of Exercise per Week: 0 days    Minutes of Exercise per Session: 0 min  Stress: No Stress Concern Present (09/29/2023)   [employer] of Occupational Health -  Occupational Stress Questionnaire    Feeling of Stress : Not at all  Social Connections: Moderately Integrated (09/29/2023)   Social Connection and Isolation Panel [NHANES]    Frequency of Communication with Friends and Family: More than three times a week    Frequency of Social Gatherings with Friends and Family: More than three times a week    Attends Religious Services: More than 4 times per year    Active Member of [employer] or Organizations: No    Attends Banker Meetings: Never    Marital Status: Married    Tobacco Counseling Ready to quit: Yes Counseling given: Not Answered Tobacco comments: Smokes 3 packs of cigarettes a week. 06/30/23 Tay   Clinical Intake:  Pre-visit preparation completed: Yes  Pain : No/denies pain     Nutritional Risks: None Diabetes: No  How often do you need to have someone help you when you read instructions, pamphlets, or other written materials from your doctor or pharmacy?: 1 - Never  Interpreter Needed?: No  Information entered by :: NAllen LPN   Activities of Daily Living    09/29/2023    2:12 PM 02/02/2023   11:38 AM  In your present state of health, do you have any difficulty performing the following activities:  Hearing? 0   Vision? 0   Difficulty concentrating or  making decisions? 0   Walking or climbing stairs? 0   Dressing or bathing? 0   Doing errands, shopping? 0 0  Preparing Food and eating ? N   Using the Toilet? N   In the past six months, have you accidently leaked urine? N   Do you have problems with loss of bowel control? N   Managing your Medications? N   Managing your Finances? N   Housekeeping or managing your Housekeeping? N     Patient Care Team: [name], MD as PCP - General (Family Medicine) [name], MD as PCP - Cardiology (Cardiology) Fleeta Emmer, RN as [facility] Care Management  Indicate any recent Medical Services you may have received from other than [facility] providers in the past year (date may be approximate).     Assessment:   This is a routine wellness examination for [place].  Hearing/Vision screen Hearing Screening - Comments:: Denies hearing issues Vision Screening - Comments:: No regular eye exams   Goals Addressed             This Visit's Progress    Patient Stated       09/29/2023, denies goals       Depression Screen    09/29/2023    2:23 PM 06/20/2023    9:44 AM 12/06/2022    1:49 PM 12/01/2022    3:12 PM 10/03/2022    9:49 AM 07/11/2022   10:44 AM 03/14/2022    2:47 PM  PHQ 2/9 Scores  PHQ - 2 Score 0 0 0 0 0 0 2  PHQ- 9 Score   0    9    Fall Risk    09/29/2023    2:22 PM 12/01/2022    3:15 PM 07/11/2022   10:43 AM 09/12/2021   10:02 AM 05/05/2021   10:16 AM  Fall Risk   Falls in the past year? 1 0 0 0 0  Comment feet got tangled      Number falls in past yr: 0 0  0 0  Injury with Fall? 0 0  0 0  Risk for fall due to :  Medication side effect No Fall Risks  No Fall Risks No Fall Risks  Follow up Falls prevention discussed;Falls evaluation completed Falls prevention discussed  Falls evaluation completed Falls evaluation completed    MEDICARE RISK AT HOME: Medicare Risk at Home Any stairs in or around the home?: No If so, are there any without  handrails?: No Home free of loose throw rugs in walkways, pet beds, electrical cords, etc?: Yes Adequate lighting in your home to reduce risk of falls?: Yes Life alert?: No Use of a cane, walker or w/c?: No Grab bars in the bathroom?: Yes Shower chair or bench in shower?: No Elevated toilet seat or a handicapped toilet?: No  TIMED UP AND GO:  Was the test performed?  No    Cognitive Function:        09/29/2023    2:23 PM 12/01/2022    3:17 PM 09/12/2021   10:03 AM  6CIT Screen  What Year? 0 points 0 points 0 points  What month? 0 points 0 points 0 points  What time? 0 points 0 points 0 points  Count back from 20 0 points 0 points 0 points  Months in reverse 4 points 4 points 0 points  Repeat phrase 2 points 0 points 0 points  Total Score 6 points 4 points 0 points    Immunizations Immunization History  Administered Date(s) Administered   PFIZER(Purple Top)SARS-COV-2 Vaccination 03/20/2020, 04/10/2020   Pneumococcal Conjugate-13 09/24/2019   Tdap 09/24/2019    TDAP status: Up to date  Flu Vaccine status: Declined, Education has been provided regarding the importance of this vaccine but patient still declined. Advised may receive this vaccine at local pharmacy or Health Dept. Aware to provide a copy of the vaccination record if obtained from local pharmacy or Health Dept. Verbalized acceptance and understanding.  Pneumococcal vaccine status: Declined,  Education has been provided regarding the importance of this vaccine but patient still declined. Advised may receive this vaccine at local pharmacy or Health Dept. Aware to provide a copy of the vaccination record if obtained from local pharmacy or Health Dept. Verbalized acceptance and understanding.   Covid-19 vaccine status: Information provided on how to obtain vaccines.   Qualifies for Shingles Vaccine? Yes   Zostavax completed No   Shingrix Completed?: No.    Education has been provided regarding the importance of  this vaccine. Patient has been advised to call insurance company to determine out of pocket expense if they have not yet received this vaccine. Advised may also receive vaccine at local pharmacy or Health Dept. Verbalized acceptance and understanding.  Screening Tests Health Maintenance  Topic Date Due   Zoster Vaccines- Shingrix (1 of 2) Never done   Pneumonia Vaccine 28+ Years old (2 of 2 - PPSV23 or PCV20) 11/19/2019   COVID-19 Vaccine (3 - Pfizer risk series) 05/08/2020   INFLUENZA VACCINE  Never done   Medicare Annual Wellness (AWV)  09/28/2024   Fecal DNA (Cologuard)  01/13/2026   DTaP/Tdap/Td (2 - Td or Tdap) 09/23/2029   Hepatitis C Screening  Completed   HPV VACCINES  Aged Out   Lung Cancer Screening  Discontinued    Health Maintenance  Health Maintenance Due  Topic Date Due   Zoster Vaccines- Shingrix (1 of 2) Never done   Pneumonia Vaccine 26+ Years old (2 of 2 - PPSV23 or PCV20) 11/19/2019   COVID-19 Vaccine (3 - Pfizer risk series) 05/08/2020   INFLUENZA VACCINE  Never done    Colorectal cancer screening:  Type of screening: Cologuard. Completed 01/13/2023. Repeat every 3 years  Lung Cancer Screening: (Low Dose CT Chest recommended if Age 58-80 years, 20 pack-year currently smoking OR have quit w/in 15years.) does qualify.   Lung Cancer Screening Referral: CT scan ordered 06/30/2023  Additional Screening:  Hepatitis C Screening: does qualify; Completed 09/24/2019  Vision Screening: Recommended annual ophthalmology exams for early detection of glaucoma and other disorders of the eye. Is the patient up to date with their annual eye exam?  No  Who is the provider or what is the name of the office in which the patient attends annual eye exams? none If pt is not established with a provider, would they like to be referred to a provider to establish care? No .   Dental Screening: Recommended annual dental exams for proper oral hygiene  Diabetic Foot Exam:  n/a  Community Resource Referral / Chronic Care Management: CRR required this visit?  No   CCM required this visit?  No     Plan:     I have personally reviewed and noted the following in the patient's chart:   Medical and social history Use of alcohol, tobacco or illicit drugs  Current medications and supplements including opioid prescriptions. Patient is not currently taking opioid prescriptions. Functional ability and status Nutritional status Physical activity Advanced directives List of other physicians Hospitalizations, surgeries, and ER visits in previous 12 months Vitals Screenings to include cognitive, depression, and falls Referrals and appointments  In addition, I have reviewed and discussed with patient certain preventive protocols, quality metrics, and best practice recommendations. A written personalized care plan for preventive services as well as general preventive health recommendations were provided to patient.     Barb Merino, LPN   [id number]/09/[id number]   After Visit Summary: (MyChart) Due to this being a telephonic visit, the after visit summary with patients personalized plan was offered to patient via MyChart   Nurse Notes: none

## 2023-09-29 NOTE — Patient Instructions (Signed)
Ronald Lowe , Thank you for taking time to come for your Medicare Wellness Visit. I appreciate your ongoing commitment to your health goals. Please review the following plan we discussed and let me know if I can assist you in the future.   Referrals/Orders/Follow-Ups/Clinician Recommendations: none  This is a list of the screening recommended for you and due dates:  Health Maintenance  Topic Date Due   Zoster (Shingles) Vaccine (1 of 2) Never done   Pneumonia Vaccine (2 of 2 - PPSV23 or PCV20) 11/19/2019   COVID-19 Vaccine (3 - Pfizer risk series) 05/08/2020   Flu Shot  Never done   Medicare Annual Wellness Visit  09/28/2024   Cologuard (Stool DNA test)  01/13/2026   DTaP/Tdap/Td vaccine (2 - Td or Tdap) 09/23/2029   Hepatitis C Screening  Completed   HPV Vaccine  Aged Out   Screening for Lung Cancer  Discontinued    Advanced directives: (ACP Link)Information on Advanced Care Planning can be found at [facility] Advance Health Care Directives Advance Health Care Directives ([email or web address]   Next Medicare Annual Wellness Visit scheduled for next year: No, schedule not open for next year  insert Preventive Care attachment Insert FALL PREVENTION attachment if needed

## 2023-10-11 ENCOUNTER — Ambulatory Visit: Payer: Medicare HMO | Admitting: Family Medicine

## 2023-10-11 ENCOUNTER — Telehealth: Payer: Self-pay | Admitting: Family Medicine

## 2023-10-11 NOTE — Telephone Encounter (Signed)
Called pt and left vm to call office back to reschedule missed appt

## 2023-10-13 DIAGNOSIS — M7522 Bicipital tendinitis, left shoulder: Secondary | ICD-10-CM | POA: Diagnosis not present

## 2023-10-13 DIAGNOSIS — M75122 Complete rotator cuff tear or rupture of left shoulder, not specified as traumatic: Secondary | ICD-10-CM | POA: Diagnosis not present

## 2023-10-13 DIAGNOSIS — M7582 Other shoulder lesions, left shoulder: Secondary | ICD-10-CM | POA: Diagnosis not present

## 2023-10-13 DIAGNOSIS — Z9889 Other specified postprocedural states: Secondary | ICD-10-CM | POA: Diagnosis not present

## 2023-11-03 ENCOUNTER — Other Ambulatory Visit: Payer: Self-pay | Admitting: Pulmonary Disease

## 2023-11-03 DIAGNOSIS — J439 Emphysema, unspecified: Secondary | ICD-10-CM

## 2023-11-08 ENCOUNTER — Telehealth: Payer: Self-pay | Admitting: Family Medicine

## 2023-11-08 ENCOUNTER — Ambulatory Visit: Payer: Medicare HMO | Admitting: Family Medicine

## 2023-11-08 NOTE — Telephone Encounter (Signed)
Patient left appointment without being seen, patient didn't want to wait to see provider even though his appointment was at 2:20pm and cma called him at 2:25pm

## 2023-12-06 ENCOUNTER — Ambulatory Visit: Payer: Medicare HMO

## 2023-12-06 ENCOUNTER — Ambulatory Visit (INDEPENDENT_AMBULATORY_CARE_PROVIDER_SITE_OTHER): Payer: Medicaid Other | Admitting: Family Medicine

## 2023-12-06 VITALS — BP 109/91 | HR 88 | Temp 98.1°F | Resp 16 | Ht 72.0 in | Wt 154.2 lb

## 2023-12-06 DIAGNOSIS — H6123 Impacted cerumen, bilateral: Secondary | ICD-10-CM | POA: Diagnosis not present

## 2023-12-06 DIAGNOSIS — I10 Essential (primary) hypertension: Secondary | ICD-10-CM

## 2023-12-06 MED ORDER — FAMOTIDINE 40 MG PO TABS: 40.0000 mg | ORAL_TABLET | Freq: Every day | ORAL | 1 refills | Status: DC

## 2023-12-06 MED ORDER — PANTOPRAZOLE SODIUM 40 MG PO TBEC: 40.0000 mg | DELAYED_RELEASE_TABLET | Freq: Every day | ORAL | 1 refills | Status: DC | PRN

## 2023-12-06 NOTE — Progress Notes (Signed)
Patient is in office today for a nurse visit for Ear Lavage. Patient ear Irrigation was preformed bilaterally ear.

## 2023-12-07 ENCOUNTER — Other Ambulatory Visit: Payer: Self-pay | Admitting: Family Medicine

## 2023-12-07 MED ORDER — FAMOTIDINE 40 MG PO TABS: 40.0000 mg | ORAL_TABLET | Freq: Every day | ORAL | 1 refills | Status: DC

## 2023-12-07 NOTE — Telephone Encounter (Signed)
Requested Prescriptions  Pending Prescriptions Disp Refills   famotidine (PEPCID) 40 MG tablet 90 tablet 1    Sig: Take 1 tablet (40 mg total) by mouth daily.     Gastroenterology:  H2 Antagonists Passed - 12/07/2023  5:49 PM      Passed - Valid encounter within last 12 months    Recent Outpatient Visits           Yesterday Essential hypertension   [place] [facility], MD   1 year ago Essential hypertension   [place] [facility], [place]   1 year ago Essential hypertension   [place] [facility], MD   1 year ago Tendinitis of left shoulder   [facility] [facility], Kasandra Knud[place], [place]   2 years ago Dysthymia   [place] [facility], [place]

## 2023-12-07 NOTE — Telephone Encounter (Signed)
Medication Refill -  Most Recent Primary Care Visit:  Provider: [facility]  Department: [facility]  Visit Type: NURSE VISIT  Date: 12/06/2023  Medication: famotidine (PEPCID) 40 MG tablet [[id number]]   Has the patient contacted their pharmacy? Yes  (Agent: If yes, when and what did the pharmacy advise?) call office   Is this the correct pharmacy for this prescription? Yes  This is the patient's preferred pharmacy:   [facility] [address] [place] - [address] [place] [address] Phone: [phone] Fax: [phone]   Has the prescription been filled recently? Yes  Is the patient out of the medication? Yes  Has the patient been seen for an appointment in the last year OR does the patient have an upcoming appointment? Yes  Can we respond through MyChart? Yes  Agent: Please be advised that Rx refills may take up to 3 business days. We ask that you follow-up with your pharmacy.     Medication was sent to the wrong pharmacy. Pt no longer uses [facility].

## 2023-12-08 ENCOUNTER — Encounter: Payer: Self-pay | Admitting: Family Medicine

## 2023-12-08 NOTE — Progress Notes (Signed)
Established Patient Office Visit  Subjective    Patient ID: Ronald Lowe, male    DOB: [birth year]  Age: 67 y.o. MRN: [id number]  CC:  Chief Complaint  Patient presents with   Medical Management of Chronic Issues    HPI Ronald Lowe presents for follow up of hypertension. He also reports that he was seen with audiometry who reports that his earwax is impacted bilaterally and needs it removed before additional eval/mgt can be done.   Outpatient Encounter Medications as of 12/06/2023  Medication Sig   ADVAIR HFA 45-21 MCG/ACT inhaler Inhale 2 puffs into the lungs as needed (wheezing).   albuterol (VENTOLIN HFA) 108 (90 Base) MCG/ACT inhaler INHALE 2 PUFFS BY MOUTH EVERY 4 HOURS AS NEEDED FOR WHEEZE OR FOR SHORTNESS OF BREATH   amiodarone (PACERONE) 200 MG tablet Take 200 mg by mouth daily.   atorvastatin (LIPITOR) 40 MG tablet Take 40 mg by mouth daily.   buPROPion (WELLBUTRIN SR) 150 MG 12 hr tablet TAKE 1 TABLET (150 MG TOTAL) BY MOUTH 2 (TWO) TIMES DAILY. START ONE PILL ONCE PER DAY FOR 3 DAYS THEN INCREASE TO TWICE DAILY   carvedilol (COREG) 3.125 MG tablet Take 1 tablet (3.125 mg total) by mouth 2 (two) times daily.   diltiazem (CARDIZEM CD) 240 MG 24 hr capsule Take 1 capsule (240 mg total) by mouth daily.   lisinopril (ZESTRIL) 5 MG tablet TAKE 1 TABLET (5 MG TOTAL) BY MOUTH DAILY.   oxycodone (OXY-IR) 5 MG capsule Take 10 mg by mouth every 4 (four) hours as needed for pain.   TRELEGY ELLIPTA 100-62.5-25 MCG/ACT AEPB Take 1 puff by mouth daily.   [DISCONTINUED] famotidine (PEPCID) 40 MG tablet TAKE 1 TABLET BY MOUTH EVERY DAY   [DISCONTINUED] pantoprazole (PROTONIX) 40 MG tablet Take 1 tablet (40 mg total) by mouth daily as needed.   pantoprazole (PROTONIX) 40 MG tablet Take 1 tablet (40 mg total) by mouth daily as needed.   [DISCONTINUED] famotidine (PEPCID) 40 MG tablet Take 1 tablet (40 mg total) by mouth daily.   No facility-administered encounter medications on file as  of 12/06/2023.    Past Medical History:  Diagnosis Date   Aortic atherosclerosis (HCC)    Cervical spinal stenosis    Chronic kidney disease    COPD (chronic obstructive pulmonary disease) (HCC)    Coronary artery calcification seen on CT scan    ETOH abuse    a.) 28+ standard drinks/week   GERD (gastroesophageal reflux disease)    History of amiodarone therapy    a.) in the past; self discontinued   History of medication noncompliance    a.) reported 10/2022 that he has been off NOAC for several months secondary to vertiginous symptoms, not acting like himself, and depression; b.) off/not taking as prescribed: antiarrhythmics, antihypertensives, statins, and MDIs   Hyperlipidemia    Hypertension    Long term current use of anticoagulant    a.) rivaroxaban   PAF (paroxysmal atrial fibrillation) (HCC)    a.) CHA2DS2VASc = 3 (age, HTN, vascular disease history);  b.) rate/rhythm maintained on oral carvediolol (self discontinued diltiazem + amiodarone + metoprolol); chronically anticoagulated with rivaroxaban (switched from apixaban 11/17/2022)   Tobacco abuse     Past Surgical History:  Procedure Laterality Date   SHOULDER ARTHROSCOPY WITH SUBACROMIAL DECOMPRESSION, ROTATOR CUFF REPAIR AND BICEP TENDON REPAIR Left 12/07/2022   Procedure: SHOULDER ARTHROSCOPY WITH DEBRIDEMENT, DECOMPRESSION, ROTATOR CUFF REPAIR AND BICEPS TENODESIS.- RNFA;  Surgeon: Christena Flake,  MD;  Location: [facility];  Service: Orthopedics;  Laterality: Left;   SHOULDER ARTHROSCOPY WITH SUBACROMIAL DECOMPRESSION, ROTATOR CUFF REPAIR AND BICEP TENDON REPAIR Left 02/02/2023   Procedure: LEFT SHOULDER ARTHROSCOPY WITH DEBRIDEMENT AND REPAIR OF RECURRENT ROTATOR CUFF TEAR OF LEFT SHOULDER;  Surgeon: Christena Flake, MD;  Location: [facility];  Service: Orthopedics;  Laterality: Left;   TONSILLECTOMY      Family History  Problem Relation Age of Onset   Emphysema Father    CAD Father        CABG   Stroke Mother    CAD  Brother 77       CABG   Hypertension Brother     Social History   Socioeconomic History   Marital status: Married    Spouse name: Bertram [place]   Number of children: 2   Years of education: Not on file   Highest education level: Not on file  Occupational History   Occupation: retired  Tobacco Use   Smoking status: Every Day    Current packs/day: 0.25    Average packs/day: 0.3 packs/day for 53.0 years (13.2 ttl pk-yrs)    Types: Cigarettes    Start date: 12/19/1970   Smokeless tobacco: Former   Tobacco comments:    Smokes 3 packs of cigarettes a week. 06/30/23 Tay  Vaping Use   Vaping status: Never Used  Substance and Sexual Activity   Alcohol use: Yes    Alcohol/week: 28.0 standard drinks of alcohol    Types: 14 Cans of beer, 14 Shots of liquor per week   Drug use: Yes    Types: Marijuana    Comment: + cocaine UDS 06/16/2021   Sexual activity: Yes    Partners: Female    Birth control/protection: None  Other Topics Concern   Not on file  Social History Narrative   Lives with wife.   Social Drivers of Corporate investment banker Strain: Low Risk  (09/29/2023)   Overall Financial Resource Strain (CARDIA)    Difficulty of Paying Living Expenses: Not hard at all  Food Insecurity: No Food Insecurity (09/29/2023)   Hunger Vital Sign    Worried About Running Out of Food in the Last Year: Never true    Ran Out of Food in the Last Year: Never true  Transportation Needs: No Transportation Needs (09/29/2023)   PRAPARE - Administrator, Civil Service (Medical): No    Lack of Transportation (Non-Medical): No  Physical Activity: Inactive (09/29/2023)   Exercise Vital Sign    Days of Exercise per Week: 0 days    Minutes of Exercise per Session: 0 min  Stress: No Stress Concern Present (09/29/2023)   [employer] of Occupational Health - Occupational Stress Questionnaire    Feeling of Stress : Not at all  Social Connections: Moderately Integrated (09/29/2023)    Social Connection and Isolation Panel [NHANES]    Frequency of Communication with Friends and Family: More than three times a week    Frequency of Social Gatherings with Friends and Family: More than three times a week    Attends Religious Services: More than 4 times per year    Active Member of [employer] or Organizations: No    Attends Banker Meetings: Never    Marital Status: Married  Catering manager Violence: Not At Risk (09/29/2023)   Humiliation, Afraid, Rape, and Kick questionnaire    Fear of Current or Ex-Partner: No    Emotionally Abused: No  Physically Abused: No    Sexually Abused: No    Review of Systems  HENT:  Positive for hearing loss.   All other systems reviewed and are negative.       Objective    BP (!) 109/91   Pulse 88   Temp 98.1 F (36.7 C) (Oral)   Resp 16   Ht 6' (1.829 m)   Wt 154 lb 3.2 oz (69.9 kg)   SpO2 94%   BMI 20.91 kg/m   Physical Exam Vitals and nursing note reviewed.  Constitutional:      General: He is not in acute distress. HENT:     Right Ear: There is impacted cerumen.     Left Ear: There is impacted cerumen.  Cardiovascular:     Rate and Rhythm: Normal rate and regular rhythm.  Pulmonary:     Effort: Pulmonary effort is normal.     Breath sounds: Normal breath sounds.  Abdominal:     Palpations: Abdomen is soft.     Tenderness: There is no abdominal tenderness.  Neurological:     General: No focal deficit present.     Mental Status: He is alert and oriented to person, place, and time.         Assessment & Plan:   Essential hypertension  Hearing loss of both ears due to cerumen impaction  Other orders -     Pantoprazole Sodium; Take 1 tablet (40 mg total) by mouth daily as needed.  Dispense: 30 tablet; Refill: 1   Cerumen was irrigated out bilaterally without d/c  Return in about 6 months (around 06/05/2024) for follow up.   Tommie Raymond, MD

## 2023-12-12 ENCOUNTER — Other Ambulatory Visit: Payer: Self-pay

## 2023-12-12 ENCOUNTER — Ambulatory Visit
Admission: EM | Admit: 2023-12-12 | Discharge: 2023-12-12 | Disposition: A | Discharge status: Home / Self Care | Payer: Medicare HMO | Attending: Family Medicine | Admitting: Family Medicine

## 2023-12-12 ENCOUNTER — Encounter: Payer: Self-pay | Admitting: Emergency Medicine

## 2023-12-12 DIAGNOSIS — L299 Pruritus, unspecified: Secondary | ICD-10-CM | POA: Diagnosis not present

## 2023-12-12 DIAGNOSIS — I10 Essential (primary) hypertension: Secondary | ICD-10-CM | POA: Diagnosis not present

## 2023-12-12 DIAGNOSIS — R21 Rash and other nonspecific skin eruption: Secondary | ICD-10-CM

## 2023-12-12 MED ORDER — DEXAMETHASONE SODIUM PHOSPHATE 10 MG/ML IJ SOLN
10.0000 mg | Freq: Once | INTRAMUSCULAR | Status: AC
  Administered 2023-12-12: 10 mg via INTRAMUSCULAR

## 2023-12-12 MED ORDER — PERMETHRIN 5 % EX CREA: TOPICAL_CREAM | CUTANEOUS | 1 refills | Status: DC

## 2023-12-12 NOTE — ED Provider Notes (Signed)
[facility]   [id number] 12/12/23 Arrival Time: 1311  ASSESSMENT & PLAN:  1. Rash and nonspecific skin eruption   2. Itching   3. Elevated blood pressure reading with diagnosis of hypertension     Meds ordered this encounter  Medications   permethrin (ELIMITE) 5 % cream    Sig: Apply from neck down before bed then wash off in the morning. May repeat in one week.    Dispense:  60 g    Refill:  1   dexamethasone (DECADRON) injection 10 mg    Follow-up Information     [name], MD.   Specialty: Family Medicine Why: If worsening or failing to improve as anticipated. And to recheck your blood pressure. Contact information: [address] [employer] [address] [place] [address] [phone]                  Will follow up with PCP or here if worsening or failing to improve as anticipated. Reviewed expectations re: course of current medical issues. Questions answered. Outlined signs and symptoms indicating need for more acute intervention. Patient verbalized understanding. After Visit Summary given.   SUBJECTIVE:  Ronald Lowe is a 67 y.o. male who presents with a skin complaint. Itchy rash; noted on lower abdomen approx one month ago; now noted on LE. Denies fever. No new exposures. No travel.   Increased blood pressure noted today. Reports that he is treated for HTN. No symptoms.  OBJECTIVE: Vitals:   12/12/23 1444  BP: (!) 168/87  Pulse: 95  Resp: 18  Temp: 98.4 F (36.9 C)  TempSrc: Oral  SpO2: 95%    General appearance: alert; no distress HEENT: [place]; AT Extremities: no edema; moves all extremities normally Skin: warm and dry; pinpoint excoriations over abdomen and lower extremities; no signs of folliculitis or abscess formation Psychological: alert and cooperative; normal mood and affect  No Known Allergies  Past Medical History:  Diagnosis Date   Aortic atherosclerosis (HCC)    Cervical spinal stenosis    Chronic kidney disease     COPD (chronic obstructive pulmonary disease) (HCC)    Coronary artery calcification seen on CT scan    ETOH abuse    a.) 28+ standard drinks/week   GERD (gastroesophageal reflux disease)    History of amiodarone therapy    a.) in the past; self discontinued   History of medication noncompliance    a.) reported 10/2022 that he has been off NOAC for several months secondary to vertiginous symptoms, not acting like himself, and depression; b.) off/not taking as prescribed: antiarrhythmics, antihypertensives, statins, and MDIs   Hyperlipidemia    Hypertension    Long term current use of anticoagulant    a.) rivaroxaban   PAF (paroxysmal atrial fibrillation) (HCC)    a.) CHA2DS2VASc = 3 (age, HTN, vascular disease history);  b.) rate/rhythm maintained on oral carvediolol (self discontinued diltiazem + amiodarone + metoprolol); chronically anticoagulated with rivaroxaban (switched from apixaban 11/17/2022)   Tobacco abuse    Social History   Socioeconomic History   Marital status: Married    Spouse name: Bertram [place]   Number of children: 2   Years of education: Not on file   Highest education level: Not on file  Occupational History   Occupation: retired  Tobacco Use   Smoking status: Every Day    Current packs/day: 0.25    Average packs/day: 0.3 packs/day for 53.0 years (13.2 ttl pk-yrs)    Types: Cigarettes    Start  date: 12/19/1970   Smokeless tobacco: Former   Tobacco comments:    Smokes 3 packs of cigarettes a week. 06/30/23 Tay  Vaping Use   Vaping status: Never Used  Substance and Sexual Activity   Alcohol use: Yes    Alcohol/week: 28.0 standard drinks of alcohol    Types: 14 Cans of beer, 14 Shots of liquor per week   Drug use: Yes    Types: Marijuana    Comment: + cocaine UDS 06/16/2021   Sexual activity: Yes    Partners: Female    Birth control/protection: None  Other Topics Concern   Not on file  Social History Narrative   Lives with wife.   Social Drivers of  Corporate investment banker Strain: Low Risk  (09/29/2023)   Overall Financial Resource Strain (CARDIA)    Difficulty of Paying Living Expenses: Not hard at all  Food Insecurity: No Food Insecurity (09/29/2023)   Hunger Vital Sign    Worried About Running Out of Food in the Last Year: Never true    Ran Out of Food in the Last Year: Never true  Transportation Needs: No Transportation Needs (09/29/2023)   PRAPARE - Administrator, Civil Service (Medical): No    Lack of Transportation (Non-Medical): No  Physical Activity: Inactive (09/29/2023)   Exercise Vital Sign    Days of Exercise per Week: 0 days    Minutes of Exercise per Session: 0 min  Stress: No Stress Concern Present (09/29/2023)   [employer] of Occupational Health - Occupational Stress Questionnaire    Feeling of Stress : Not at all  Social Connections: Moderately Integrated (09/29/2023)   Social Connection and Isolation Panel [NHANES]    Frequency of Communication with Friends and Family: More than three times a week    Frequency of Social Gatherings with Friends and Family: More than three times a week    Attends Religious Services: More than 4 times per year    Active Member of [employer] or Organizations: No    Attends Banker Meetings: Never    Marital Status: Married  Catering manager Violence: Not At Risk (09/29/2023)   Humiliation, Afraid, Rape, and Kick questionnaire    Fear of Current or Ex-Partner: No    Emotionally Abused: No    Physically Abused: No    Sexually Abused: No   Family History  Problem Relation Age of Onset   Emphysema Father    CAD Father        CABG   Stroke Mother    CAD Brother 10       CABG   Hypertension Brother    Past Surgical History:  Procedure Laterality Date   SHOULDER ARTHROSCOPY WITH SUBACROMIAL DECOMPRESSION, ROTATOR CUFF REPAIR AND BICEP TENDON REPAIR Left 12/07/2022   Procedure: SHOULDER ARTHROSCOPY WITH DEBRIDEMENT, DECOMPRESSION, ROTATOR CUFF  REPAIR AND BICEPS TENODESIS.- RNFA;  Surgeon: Christena Flake, MD;  Location: [facility];  Service: Orthopedics;  Laterality: Left;   SHOULDER ARTHROSCOPY WITH SUBACROMIAL DECOMPRESSION, ROTATOR CUFF REPAIR AND BICEP TENDON REPAIR Left 02/02/2023   Procedure: LEFT SHOULDER ARTHROSCOPY WITH DEBRIDEMENT AND REPAIR OF RECURRENT ROTATOR CUFF TEAR OF LEFT SHOULDER;  Surgeon: Christena Flake, MD;  Location: [facility];  Service: Orthopedics;  Laterality: Left;   TONSILLECTOMY        Mardella Layman, MD 12/12/23 [phone]

## 2023-12-12 NOTE — ED Triage Notes (Signed)
Pt here for rash to stomach and back x several weeks that is itchy

## 2023-12-12 NOTE — Discharge Instructions (Addendum)
Your blood pressure was noted to be elevated during your visit today. If you are currently taking medication for high blood pressure, please ensure you are taking this as directed. If you do not have a history of high blood pressure and your blood pressure remains persistently elevated, you may need to begin taking a medication at some point. You may return here within the next few days to recheck if unable to see your primary care provider or if you do not have a one.  BP (!) 168/87 (BP Location: Left Arm)   Pulse 95   Temp 98.4 F (36.9 C) (Oral)   Resp 18   SpO2 95%   BP Readings from Last 3 Encounters:  12/12/23 (!) 168/87  12/06/23 (!) 109/91  06/30/23 (!) 140/90

## 2023-12-21 ENCOUNTER — Ambulatory Visit: Payer: Self-pay

## 2023-12-21 NOTE — Patient Outreach (Signed)
  Care Coordination   12/21/2023 Name: Ronald Lowe MRN: [id number] DOB: April 13, 1956   Care Coordination Outreach Attempts:  An unsuccessful outreach was attempted for an appointment today.  Follow Up Plan:  No further outreach attempts will be made at this time. We have been unable to contact the patient to offer or enroll patient in complex care management services.  Encounter Outcome:  No Answer   Care Coordination Interventions:  No, not indicated     Tonja Jezewski J Zerrick Hanssen, RN, MSN RN Care Manager [facility], Population Health Direct Dial: [phone]  Fax: [phone] Website: [email or web address]

## 2024-01-05 ENCOUNTER — Telehealth: Payer: Self-pay

## 2024-01-05 NOTE — Patient Outreach (Signed)
  Care Coordination   01/05/2024 Name: Ronald Lowe MRN: [id number] DOB: 07-29-1956   Care Coordination Outreach Attempts:  A second unsuccessful outreach was attempted today to offer the patient with information about available complex care management services.  Follow Up Plan:  Additional outreach attempts will be made to offer the patient complex care management information and services.   Encounter Outcome:  No Answer   Care Coordination Interventions:  No, not indicated    Akya Fiorello Idelle Jo, RN, MSN [facility], [facility] RN Care Manager Direct Dial: [phone]  Fax: [phone] Website: [email or web address]

## 2024-01-08 ENCOUNTER — Ambulatory Visit (HOSPITAL_COMMUNITY): Payer: Medicare HMO

## 2024-01-11 ENCOUNTER — Other Ambulatory Visit: Payer: Self-pay | Admitting: Family Medicine

## 2024-01-16 ENCOUNTER — Other Ambulatory Visit: Payer: Self-pay | Admitting: Family Medicine

## 2024-01-16 ENCOUNTER — Telehealth: Payer: Self-pay | Admitting: *Deleted

## 2024-01-16 DIAGNOSIS — J439 Emphysema, unspecified: Secondary | ICD-10-CM

## 2024-01-16 NOTE — Telephone Encounter (Signed)
Refill request

## 2024-01-23 ENCOUNTER — Telehealth: Payer: Self-pay

## 2024-01-23 NOTE — Patient Instructions (Signed)
Visit Information  Thank you for taking time to visit with me today. Please don't hesitate to contact me if I can be of assistance to you.   Following are the goals we discussed today:   Goals Addressed             This Visit's Progress    COMPLETED: Hypertension Management       Care Coordination Interventions: Evaluation of current treatment plan related to hypertension self management and patient's adherence to plan as established by provider Reviewed medications with patient and discussed importance of compliance Discussed plans with patient for ongoing care management follow up and provided patient with direct contact information for care management team  Patient Goals/Self Care Activities: -Calls provider office for new concerns, questions, or BP outside discussed parameters -Checks BP and records as discussed -Follows a low sodium diet/DASH diet    Patient reports doing well.   Blood pressure less than 140/80.  Reiterated HTN Management and low salt diet.  No concerns. Patient meeting goals. Closing case.           If you are experiencing a Mental Health or Behavioral Health Crisis or need someone to talk to, please call the Suicide and Crisis Lifeline: 988   Patient verbalizes understanding of instructions and care plan provided today and agrees to view in MyChart. Active MyChart status and patient understanding of how to access instructions and care plan via MyChart confirmed with patient.     The patient has been provided with contact information for the care management team and has been advised to call with any health related questions or concerns.   Bary Leriche RN, MSN [facility], [facility] RN Care Manager Direct Dial: [phone]  Fax: [phone] Website: [email or web address]

## 2024-01-23 NOTE — Patient Outreach (Signed)
  Care Coordination   Follow Up Visit Note   01/23/2024 Name: ZAVIYAR RAHAL MRN: [id number] DOB: 1956-03-12  SHONDALE QUINLEY is a 68 y.o. year old male who sees Tanda Bleacher, MD for primary care. I spoke with  Lorrene CHRISTELLA Abbot by phone today.  What matters to the patients health and wellness today?  Maintain health    Goals Addressed             This Visit's Progress    COMPLETED: Hypertension Management       Care Coordination Interventions: Evaluation of current treatment plan related to hypertension self management and patient's adherence to plan as established by provider Reviewed medications with patient and discussed importance of compliance Discussed plans with patient for ongoing care management follow up and provided patient with direct contact information for care management team  Patient Goals/Self Care Activities: -Calls provider office for new concerns, questions, or BP outside discussed parameters -Checks BP and records as discussed -Follows a low sodium diet/DASH diet    Patient reports doing well.   Blood pressure less than 140/80.  Reiterated HTN Management and low salt diet.  No concerns. Patient meeting goals. Closing case.          SDOH assessments and interventions completed:  Yes     Care Coordination Interventions:  Yes, provided   Follow up plan: No further intervention required.   Encounter Outcome:  Patient Visit Completed

## 2024-01-29 ENCOUNTER — Ambulatory Visit (HOSPITAL_COMMUNITY)
Admission: RE | Admit: 2024-01-29 | Discharge: 2024-01-29 | Disposition: A | Discharge status: Home / Self Care | Payer: Medicare HMO | Source: Ambulatory Visit | Attending: Acute Care | Admitting: Acute Care

## 2024-01-29 DIAGNOSIS — I700 Atherosclerosis of aorta: Secondary | ICD-10-CM | POA: Insufficient documentation

## 2024-01-29 DIAGNOSIS — J439 Emphysema, unspecified: Secondary | ICD-10-CM | POA: Insufficient documentation

## 2024-01-29 DIAGNOSIS — R911 Solitary pulmonary nodule: Secondary | ICD-10-CM | POA: Insufficient documentation

## 2024-01-29 DIAGNOSIS — F17210 Nicotine dependence, cigarettes, uncomplicated: Secondary | ICD-10-CM | POA: Diagnosis not present

## 2024-01-29 DIAGNOSIS — Z122 Encounter for screening for malignant neoplasm of respiratory organs: Secondary | ICD-10-CM | POA: Insufficient documentation

## 2024-01-29 DIAGNOSIS — R918 Other nonspecific abnormal finding of lung field: Secondary | ICD-10-CM | POA: Diagnosis not present

## 2024-01-29 DIAGNOSIS — I2510 Atherosclerotic heart disease of native coronary artery without angina pectoris: Secondary | ICD-10-CM | POA: Insufficient documentation

## 2024-02-12 ENCOUNTER — Other Ambulatory Visit: Payer: Self-pay

## 2024-02-12 DIAGNOSIS — F17210 Nicotine dependence, cigarettes, uncomplicated: Secondary | ICD-10-CM

## 2024-02-12 DIAGNOSIS — Z122 Encounter for screening for malignant neoplasm of respiratory organs: Secondary | ICD-10-CM

## 2024-02-12 DIAGNOSIS — Z87891 Personal history of nicotine dependence: Secondary | ICD-10-CM

## 2024-02-14 ENCOUNTER — Telehealth: Payer: Self-pay | Admitting: Family Medicine

## 2024-02-14 NOTE — Telephone Encounter (Signed)
 Copied from CRM [id number]. Topic: Appointments - Appointment Scheduling >> Feb 13, 2024  3:55 PM Phill Myron wrote: Scheduled with Cordelia Pen patients spouse, she says patient has had a rash for a long while now and she has been giving him different meds and also gave him one of her antibiotics and has been rubbing different ointments on him. She declined speaking with NT and declined scheduling with another provider. If possible if you have a sooner appt with Dr Andrey Campanile in the afternoon or late morning, can he have it.  She made the comment, "If it ain't killed him now, he will be okay til next week"

## 2024-02-14 NOTE — Telephone Encounter (Signed)
 Patient will be placed on waitlist if sooner appointment comes open they will be contacted

## 2024-02-22 ENCOUNTER — Encounter: Payer: Self-pay | Admitting: Family Medicine

## 2024-02-22 ENCOUNTER — Ambulatory Visit (INDEPENDENT_AMBULATORY_CARE_PROVIDER_SITE_OTHER): Payer: Medicare HMO | Admitting: Family Medicine

## 2024-02-22 VITALS — BP 136/85 | HR 100 | Temp 98.0°F | Resp 16 | Ht 72.0 in | Wt 154.6 lb

## 2024-02-22 DIAGNOSIS — R21 Rash and other nonspecific skin eruption: Secondary | ICD-10-CM | POA: Diagnosis not present

## 2024-02-22 DIAGNOSIS — Z23 Encounter for immunization: Secondary | ICD-10-CM

## 2024-02-22 MED ORDER — HYDROXYZINE PAMOATE 25 MG PO CAPS: 25.0000 mg | ORAL_CAPSULE | Freq: Four times a day (QID) | ORAL | 1 refills | Status: DC | PRN

## 2024-02-22 MED ORDER — TRIAMCINOLONE ACETONIDE 0.1 % EX CREA: 1.0000 | TOPICAL_CREAM | Freq: Two times a day (BID) | CUTANEOUS | 1 refills | Status: DC

## 2024-02-23 ENCOUNTER — Encounter: Payer: Self-pay | Admitting: Family Medicine

## 2024-02-23 NOTE — Progress Notes (Signed)
 Established Patient Office Visit  Subjective    Patient ID: Ronald Lowe, male    DOB: Jun 22, 1956  Age: 68 y.o. MRN: [id number]  CC:  Chief Complaint  Patient presents with   Rash    Patient not focusing, something on the bottom of foot, patient has shob    HPI TORIS LAVERDIERE presents with complaint of rash primarily on abdomen and upper thighs that iis itchy. He has had it for several weeks.He denies known contacts or exposures. He was seen in UC and given meds for scabies without any results.   Outpatient Encounter Medications as of 02/22/2024  Medication Sig   ADVAIR HFA 45-21 MCG/ACT inhaler Inhale 2 puffs into the lungs as needed (wheezing).   albuterol (VENTOLIN HFA) 108 (90 Base) MCG/ACT inhaler INHALE 2 PUFFS BY MOUTH EVERY 4 HOURS AS NEEDED FOR WHEEZE OR FOR SHORTNESS OF BREATH   amiodarone (PACERONE) 200 MG tablet Take 200 mg by mouth daily.   atorvastatin (LIPITOR) 40 MG tablet Take 40 mg by mouth daily.   carvedilol (COREG) 3.125 MG tablet Take 1 tablet (3.125 mg total) by mouth 2 (two) times daily.   diltiazem (CARDIZEM CD) 240 MG 24 hr capsule Take 1 capsule (240 mg total) by mouth daily.   famotidine (PEPCID) 40 MG tablet Take 1 tablet (40 mg total) by mouth daily.   hydrOXYzine (VISTARIL) 25 MG capsule Take 1 capsule (25 mg total) by mouth every 6 (six) hours as needed.   lisinopril (ZESTRIL) 5 MG tablet TAKE 1 TABLET EVERY DAY   oxycodone (OXY-IR) 5 MG capsule Take 10 mg by mouth every 4 (four) hours as needed for pain.   pantoprazole (PROTONIX) 40 MG tablet Take 1 tablet (40 mg total) by mouth daily as needed.   permethrin (ELIMITE) 5 % cream Apply from neck down before bed then wash off in the morning. May repeat in one week.   TRELEGY ELLIPTA 100-62.5-25 MCG/ACT AEPB INHALE 1 PUFF EVERY DAY   triamcinolone cream (KENALOG) 0.1 % Apply 1 Application topically 2 (two) times daily.   buPROPion (WELLBUTRIN SR) 150 MG 12 hr tablet TAKE 1 TABLET (150 MG TOTAL) BY MOUTH  2 (TWO) TIMES DAILY. START ONE PILL ONCE PER DAY FOR 3 DAYS THEN INCREASE TO TWICE DAILY   No facility-administered encounter medications on file as of 02/22/2024.    Past Medical History:  Diagnosis Date   Aortic atherosclerosis (HCC)    Cervical spinal stenosis    Chronic kidney disease    COPD (chronic obstructive pulmonary disease) (HCC)    Coronary artery calcification seen on CT scan    ETOH abuse    a.) 28+ standard drinks/week   GERD (gastroesophageal reflux disease)    History of amiodarone therapy    a.) in the past; self discontinued   History of medication noncompliance    a.) reported 10/2022 that he has been off NOAC for several months secondary to vertiginous symptoms, not acting like himself, and depression; b.) off/not taking as prescribed: antiarrhythmics, antihypertensives, statins, and MDIs   Hyperlipidemia    Hypertension    Long term current use of anticoagulant    a.) rivaroxaban   PAF (paroxysmal atrial fibrillation) (HCC)    a.) CHA2DS2VASc = 3 (age, HTN, vascular disease history);  b.) rate/rhythm maintained on oral carvediolol (self discontinued diltiazem + amiodarone + metoprolol); chronically anticoagulated with rivaroxaban (switched from apixaban 11/17/2022)   Tobacco abuse     Past Surgical History:  Procedure Laterality  Date   SHOULDER ARTHROSCOPY WITH SUBACROMIAL DECOMPRESSION, ROTATOR CUFF REPAIR AND BICEP TENDON REPAIR Left 12/07/2022   Procedure: SHOULDER ARTHROSCOPY WITH DEBRIDEMENT, DECOMPRESSION, ROTATOR CUFF REPAIR AND BICEPS TENODESIS.- RNFA;  Surgeon: Christena Flake, MD;  Location: [facility];  Service: Orthopedics;  Laterality: Left;   SHOULDER ARTHROSCOPY WITH SUBACROMIAL DECOMPRESSION, ROTATOR CUFF REPAIR AND BICEP TENDON REPAIR Left 02/02/2023   Procedure: LEFT SHOULDER ARTHROSCOPY WITH DEBRIDEMENT AND REPAIR OF RECURRENT ROTATOR CUFF TEAR OF LEFT SHOULDER;  Surgeon: Christena Flake, MD;  Location: [facility];  Service: Orthopedics;  Laterality:  Left;   TONSILLECTOMY      Family History  Problem Relation Age of Onset   Emphysema Father    CAD Father        CABG   Stroke Mother    CAD Brother 73       CABG   Hypertension Brother     Social History   Socioeconomic History   Marital status: Married    Spouse name: Bertram Denver   Number of children: 2   Years of education: Not on file   Highest education level: Not on file  Occupational History   Occupation: retired  Tobacco Use   Smoking status: Every Day    Current packs/day: 0.25    Average packs/day: 0.3 packs/day for 53.2 years (13.3 ttl pk-yrs)    Types: Cigarettes    Start date: 12/19/1970   Smokeless tobacco: Former   Tobacco comments:    Smokes 3 packs of cigarettes a week. 06/30/23 Tay  Vaping Use   Vaping status: Never Used  Substance and Sexual Activity   Alcohol use: Yes    Alcohol/week: 28.0 standard drinks of alcohol    Types: 14 Cans of beer, 14 Shots of liquor per week   Drug use: Yes    Types: Marijuana    Comment: + cocaine UDS 06/16/2021   Sexual activity: Yes    Partners: Female    Birth control/protection: None  Other Topics Concern   Not on file  Social History Narrative   Lives with wife.   Social Drivers of Corporate investment banker Strain: Low Risk  (09/29/2023)   Overall Financial Resource Strain (CARDIA)    Difficulty of Paying Living Expenses: Not hard at all  Food Insecurity: No Food Insecurity (09/29/2023)   Hunger Vital Sign    Worried About Running Out of Food in the Last Year: Never true    Ran Out of Food in the Last Year: Never true  Transportation Needs: No Transportation Needs (09/29/2023)   PRAPARE - Administrator, Civil Service (Medical): No    Lack of Transportation (Non-Medical): No  Physical Activity: Inactive (09/29/2023)   Exercise Vital Sign    Days of Exercise per Week: 0 days    Minutes of Exercise per Session: 0 min  Stress: No Stress Concern Present (09/29/2023)   [employer] of  Occupational Health - Occupational Stress Questionnaire    Feeling of Stress : Not at all  Social Connections: Moderately Integrated (09/29/2023)   Social Connection and Isolation Panel [NHANES]    Frequency of Communication with Friends and Family: More than three times a week    Frequency of Social Gatherings with Friends and Family: More than three times a week    Attends Religious Services: More than 4 times per year    Active Member of [employer] or Organizations: No    Attends Banker Meetings: Never  Marital Status: Married  Catering manager Violence: Not At Risk (09/29/2023)   Humiliation, Afraid, Rape, and Kick questionnaire    Fear of Current or Ex-Partner: No    Emotionally Abused: No    Physically Abused: No    Sexually Abused: No    Review of Systems  Constitutional:  Negative for chills and fever.  Skin:  Positive for itching and rash.  All other systems reviewed and are negative.       Objective    BP 136/85   Pulse 100   Temp 98 F (36.7 C) (Oral)   Resp 16   Ht 6' (1.829 m)   Wt 154 lb 9.6 oz (70.1 kg)   SpO2 96%   BMI 20.97 kg/m   Physical Exam Vitals and nursing note reviewed.  Constitutional:      General: He is not in acute distress. Cardiovascular:     Rate and Rhythm: Normal rate and regular rhythm.  Pulmonary:     Effort: Pulmonary effort is normal.     Breath sounds: Normal breath sounds.  Skin:    General: Skin is warm and dry.     Findings: Rash present. Rash is papular.  Neurological:     General: No focal deficit present.     Mental Status: He is alert and oriented to person, place, and time.         Assessment & Plan:   Rash and nonspecific skin eruption  Other orders -     hydrOXYzine Pamoate; Take 1 capsule (25 mg total) by mouth every 6 (six) hours as needed.  Dispense: 60 capsule; Refill: 1 -     Triamcinolone Acetonide; Apply 1 Application topically 2 (two) times daily.  Dispense: 80 g; Refill: 1   Skin  care discussed. Consider prednisone if sx persist.   Return if symptoms worsen or fail to improve.   Tommie Raymond, MD

## 2024-02-29 ENCOUNTER — Other Ambulatory Visit: Payer: Self-pay | Admitting: Family Medicine

## 2024-04-11 ENCOUNTER — Other Ambulatory Visit: Payer: Self-pay | Admitting: Cardiovascular Disease

## 2024-04-11 DIAGNOSIS — J439 Emphysema, unspecified: Secondary | ICD-10-CM

## 2024-04-16 ENCOUNTER — Other Ambulatory Visit: Payer: Self-pay | Admitting: Family Medicine

## 2024-04-16 DIAGNOSIS — J439 Emphysema, unspecified: Secondary | ICD-10-CM

## 2024-04-16 NOTE — Telephone Encounter (Signed)
 Requested medication (s) are due for refill today: Yes  Requested medication (s) are on the active medication list: Yes  Last refill:  11/03/23, 01/16/24  Future visit scheduled: No  Notes to clinic:  Unable to refill per protocol, last refill by another provide, no refill protocol for this medication. See notes below from patient's wife regarding refills.     Requested Prescriptions  Pending Prescriptions Disp Refills   albuterol  (VENTOLIN  HFA) 108 (90 Base) MCG/ACT inhaler 18 each 5     Pulmonology:  Beta Agonists 2 Passed - 04/16/2024  4:36 PM      Passed - Last BP in normal range    BP Readings from Last 1 Encounters:  02/22/24 136/85         Passed - Last Heart Rate in normal range    Pulse Readings from Last 1 Encounters:  02/22/24 100         Passed - Valid encounter within last 12 months    Recent Outpatient Visits           1 month ago Rash and nonspecific skin eruption   [place] [facility], [place]   4 months ago Essential hypertension   [place] [facility], MD   1 year ago Essential hypertension   [place] [facility], MD   2 years ago Essential hypertension   [place] [facility], Ray Caffey, MD   2 years ago Tendinitis of left shoulder   [facility], Cari S, PA-C               TRELEGY ELLIPTA  100-62.5-25 MCG/ACT AEPB 180 each 0     Off-Protocol Failed - 04/16/2024  4:36 PM      Failed - Medication not assigned to a protocol, review manually.      Passed - Valid encounter within last 12 months    Recent Outpatient Visits           1 month ago Rash and nonspecific skin eruption   [place] [facility], MD   4 months ago Essential hypertension   [place] [facility], MD   1 year ago Essential hypertension    [place] [facility], MD   2 years ago Essential hypertension    [facility] at [facility], MD   2 years ago Tendinitis of left shoulder   [facility], [place], PA-C

## 2024-04-16 NOTE — Telephone Encounter (Signed)
 The wife says he absolutely has to have these and he is out. She said the pharmacy told her they sent a request but I did not see one. He sees a Diplomatic Services operational officer but states the primary Dr Elvan Hamel has been writing these for him. Please assist further

## 2024-04-16 NOTE — Telephone Encounter (Signed)
 Copied from CRM [id number]. Topic: Clinical - Medication Refill >> Apr 16, 2024  4:14 PM Donald Frost wrote: Most Recent Primary Care Visit:  Provider: Abraham Abo  Department: [facility]  Visit Type: ACUTE  Date: 02/22/2024  Medication: TRELEGY ELLIPTA  100-62.5-25 MCG/ACT AEPB, albuterol  (VENTOLIN  HFA) 108 (90 Base) MCG/ACT inhaler  Has the patient contacted their pharmacy? Yes   Is this the correct pharmacy for this prescription? Yes If no, delete pharmacy and type the correct one.  This is the patient's preferred pharmacy:  [facility] [address], [place] - [address] [place] [address] Phone: [phone] Fax: [phone]   Has the prescription been filled recently? No  Is the patient out of the medication? Yes  Has the patient been seen for an appointment in the last year OR does the patient have an upcoming appointment? Yes  Can we respond through MyChart? No  Please assist patient further

## 2024-04-22 ENCOUNTER — Other Ambulatory Visit: Payer: Self-pay | Admitting: Emergency Medicine

## 2024-04-22 ENCOUNTER — Other Ambulatory Visit: Payer: Self-pay | Admitting: Family Medicine

## 2024-04-22 DIAGNOSIS — J439 Emphysema, unspecified: Secondary | ICD-10-CM

## 2024-04-22 MED ORDER — TRELEGY ELLIPTA 100-62.5-25 MCG/ACT IN AEPB: INHALATION_SPRAY | RESPIRATORY_TRACT | 0 refills | Status: DC

## 2024-04-22 NOTE — Telephone Encounter (Signed)
 Patient needs refill on trelegy at [facility] on [address] has been calling since last week

## 2024-04-22 NOTE — Telephone Encounter (Signed)
 Copied from CRM [id number]. Topic: Clinical - Prescription Issue >> Apr 22, 2024 11:46 AM Everlene Hobby D wrote: Patient is completely out of prescription TRELEGY ELLIPTA  100-62.5-25 MCG/ACT AEPB He needs this to breath and his wife says she called about this and still nothing. He has been out for 1 week or more.

## 2024-04-22 NOTE — Addendum Note (Signed)
 Addended by: Theotis Flake F on: 04/22/2024 04:06 PM   Modules accepted: Orders

## 2024-05-01 ENCOUNTER — Ambulatory Visit: Admitting: Family Medicine

## 2024-05-29 ENCOUNTER — Other Ambulatory Visit: Payer: Self-pay | Admitting: Family Medicine

## 2024-06-06 ENCOUNTER — Emergency Department (HOSPITAL_COMMUNITY)

## 2024-06-06 ENCOUNTER — Inpatient Hospital Stay (HOSPITAL_COMMUNITY)
Admission: EM | Admit: 2024-06-06 | Discharge: 2024-06-14 | DRG: 064 | Disposition: A | Discharge status: Rehabilitation Facility | Attending: Internal Medicine | Admitting: Internal Medicine

## 2024-06-06 ENCOUNTER — Encounter (HOSPITAL_COMMUNITY): Payer: Self-pay

## 2024-06-06 ENCOUNTER — Other Ambulatory Visit: Payer: Self-pay

## 2024-06-06 DIAGNOSIS — I69391 Dysphagia following cerebral infarction: Secondary | ICD-10-CM | POA: Diagnosis not present

## 2024-06-06 DIAGNOSIS — M79601 Pain in right arm: Secondary | ICD-10-CM | POA: Insufficient documentation

## 2024-06-06 DIAGNOSIS — K5901 Slow transit constipation: Secondary | ICD-10-CM | POA: Diagnosis not present

## 2024-06-06 DIAGNOSIS — Z781 Physical restraint status: Secondary | ICD-10-CM | POA: Diagnosis not present

## 2024-06-06 DIAGNOSIS — I639 Cerebral infarction, unspecified: Secondary | ICD-10-CM | POA: Diagnosis not present

## 2024-06-06 DIAGNOSIS — R471 Dysarthria and anarthria: Secondary | ICD-10-CM | POA: Diagnosis present

## 2024-06-06 DIAGNOSIS — J449 Chronic obstructive pulmonary disease, unspecified: Secondary | ICD-10-CM | POA: Diagnosis present

## 2024-06-06 DIAGNOSIS — F1210 Cannabis abuse, uncomplicated: Secondary | ICD-10-CM | POA: Diagnosis present

## 2024-06-06 DIAGNOSIS — Z79899 Other long term (current) drug therapy: Secondary | ICD-10-CM | POA: Diagnosis not present

## 2024-06-06 DIAGNOSIS — N179 Acute kidney failure, unspecified: Secondary | ICD-10-CM | POA: Diagnosis present

## 2024-06-06 DIAGNOSIS — I2510 Atherosclerotic heart disease of native coronary artery without angina pectoris: Secondary | ICD-10-CM | POA: Diagnosis present

## 2024-06-06 DIAGNOSIS — I6782 Cerebral ischemia: Secondary | ICD-10-CM | POA: Diagnosis not present

## 2024-06-06 DIAGNOSIS — G9341 Metabolic encephalopathy: Secondary | ICD-10-CM | POA: Diagnosis present

## 2024-06-06 DIAGNOSIS — F17200 Nicotine dependence, unspecified, uncomplicated: Secondary | ICD-10-CM | POA: Diagnosis not present

## 2024-06-06 DIAGNOSIS — R531 Weakness: Secondary | ICD-10-CM | POA: Diagnosis not present

## 2024-06-06 DIAGNOSIS — F17210 Nicotine dependence, cigarettes, uncomplicated: Secondary | ICD-10-CM | POA: Diagnosis present

## 2024-06-06 DIAGNOSIS — I6521 Occlusion and stenosis of right carotid artery: Secondary | ICD-10-CM | POA: Diagnosis not present

## 2024-06-06 DIAGNOSIS — Z91148 Patient's other noncompliance with medication regimen for other reason: Secondary | ICD-10-CM | POA: Diagnosis not present

## 2024-06-06 DIAGNOSIS — I63512 Cerebral infarction due to unspecified occlusion or stenosis of left middle cerebral artery: Secondary | ICD-10-CM | POA: Diagnosis present

## 2024-06-06 DIAGNOSIS — F1090 Alcohol use, unspecified, uncomplicated: Secondary | ICD-10-CM | POA: Diagnosis not present

## 2024-06-06 DIAGNOSIS — F1510 Other stimulant abuse, uncomplicated: Secondary | ICD-10-CM | POA: Diagnosis not present

## 2024-06-06 DIAGNOSIS — I1310 Hypertensive heart and chronic kidney disease without heart failure, with stage 1 through stage 4 chronic kidney disease, or unspecified chronic kidney disease: Secondary | ICD-10-CM | POA: Diagnosis present

## 2024-06-06 DIAGNOSIS — E876 Hypokalemia: Secondary | ICD-10-CM | POA: Diagnosis present

## 2024-06-06 DIAGNOSIS — E785 Hyperlipidemia, unspecified: Secondary | ICD-10-CM | POA: Diagnosis present

## 2024-06-06 DIAGNOSIS — I69322 Dysarthria following cerebral infarction: Secondary | ICD-10-CM | POA: Diagnosis not present

## 2024-06-06 DIAGNOSIS — F1410 Cocaine abuse, uncomplicated: Secondary | ICD-10-CM | POA: Diagnosis present

## 2024-06-06 DIAGNOSIS — R29708 NIHSS score 8: Secondary | ICD-10-CM | POA: Diagnosis not present

## 2024-06-06 DIAGNOSIS — I6340 Cerebral infarction due to embolism of unspecified cerebral artery: Secondary | ICD-10-CM | POA: Diagnosis not present

## 2024-06-06 DIAGNOSIS — Z825 Family history of asthma and other chronic lower respiratory diseases: Secondary | ICD-10-CM

## 2024-06-06 DIAGNOSIS — F1910 Other psychoactive substance abuse, uncomplicated: Secondary | ICD-10-CM | POA: Diagnosis not present

## 2024-06-06 DIAGNOSIS — I10 Essential (primary) hypertension: Secondary | ICD-10-CM | POA: Diagnosis not present

## 2024-06-06 DIAGNOSIS — I6522 Occlusion and stenosis of left carotid artery: Secondary | ICD-10-CM | POA: Diagnosis not present

## 2024-06-06 DIAGNOSIS — R29713 NIHSS score 13: Secondary | ICD-10-CM | POA: Diagnosis present

## 2024-06-06 DIAGNOSIS — Z7901 Long term (current) use of anticoagulants: Secondary | ICD-10-CM

## 2024-06-06 DIAGNOSIS — M5030 Other cervical disc degeneration, unspecified cervical region: Secondary | ICD-10-CM | POA: Diagnosis present

## 2024-06-06 DIAGNOSIS — I6529 Occlusion and stenosis of unspecified carotid artery: Secondary | ICD-10-CM | POA: Diagnosis not present

## 2024-06-06 DIAGNOSIS — G936 Cerebral edema: Secondary | ICD-10-CM | POA: Diagnosis not present

## 2024-06-06 DIAGNOSIS — I700 Atherosclerosis of aorta: Secondary | ICD-10-CM | POA: Diagnosis not present

## 2024-06-06 DIAGNOSIS — I6523 Occlusion and stenosis of bilateral carotid arteries: Secondary | ICD-10-CM | POA: Diagnosis present

## 2024-06-06 DIAGNOSIS — R9431 Abnormal electrocardiogram [ECG] [EKG]: Secondary | ICD-10-CM

## 2024-06-06 DIAGNOSIS — R29818 Other symptoms and signs involving the nervous system: Secondary | ICD-10-CM | POA: Diagnosis not present

## 2024-06-06 DIAGNOSIS — E538 Deficiency of other specified B group vitamins: Secondary | ICD-10-CM | POA: Diagnosis not present

## 2024-06-06 DIAGNOSIS — R451 Restlessness and agitation: Secondary | ICD-10-CM | POA: Diagnosis not present

## 2024-06-06 DIAGNOSIS — I69351 Hemiplegia and hemiparesis following cerebral infarction affecting right dominant side: Secondary | ICD-10-CM | POA: Diagnosis not present

## 2024-06-06 DIAGNOSIS — Z9889 Other specified postprocedural states: Secondary | ICD-10-CM | POA: Diagnosis not present

## 2024-06-06 DIAGNOSIS — M7989 Other specified soft tissue disorders: Secondary | ICD-10-CM | POA: Diagnosis not present

## 2024-06-06 DIAGNOSIS — I6503 Occlusion and stenosis of bilateral vertebral arteries: Secondary | ICD-10-CM | POA: Diagnosis not present

## 2024-06-06 DIAGNOSIS — I672 Cerebral atherosclerosis: Secondary | ICD-10-CM | POA: Diagnosis not present

## 2024-06-06 DIAGNOSIS — Z8249 Family history of ischemic heart disease and other diseases of the circulatory system: Secondary | ICD-10-CM

## 2024-06-06 DIAGNOSIS — Z7951 Long term (current) use of inhaled steroids: Secondary | ICD-10-CM | POA: Diagnosis not present

## 2024-06-06 DIAGNOSIS — R29709 NIHSS score 9: Secondary | ICD-10-CM | POA: Diagnosis not present

## 2024-06-06 DIAGNOSIS — Z823 Family history of stroke: Secondary | ICD-10-CM

## 2024-06-06 DIAGNOSIS — R0902 Hypoxemia: Secondary | ICD-10-CM | POA: Diagnosis not present

## 2024-06-06 DIAGNOSIS — I63532 Cerebral infarction due to unspecified occlusion or stenosis of left posterior cerebral artery: Secondary | ICD-10-CM | POA: Diagnosis not present

## 2024-06-06 DIAGNOSIS — Z7902 Long term (current) use of antithrombotics/antiplatelets: Secondary | ICD-10-CM

## 2024-06-06 DIAGNOSIS — N1831 Chronic kidney disease, stage 3a: Secondary | ICD-10-CM | POA: Diagnosis present

## 2024-06-06 DIAGNOSIS — R1310 Dysphagia, unspecified: Secondary | ICD-10-CM | POA: Diagnosis present

## 2024-06-06 DIAGNOSIS — R410 Disorientation, unspecified: Secondary | ICD-10-CM | POA: Diagnosis not present

## 2024-06-06 DIAGNOSIS — I4891 Unspecified atrial fibrillation: Secondary | ICD-10-CM

## 2024-06-06 DIAGNOSIS — R414 Neurologic neglect syndrome: Secondary | ICD-10-CM | POA: Diagnosis not present

## 2024-06-06 DIAGNOSIS — I69320 Aphasia following cerebral infarction: Secondary | ICD-10-CM | POA: Diagnosis not present

## 2024-06-06 DIAGNOSIS — F10231 Alcohol dependence with withdrawal delirium: Secondary | ICD-10-CM | POA: Diagnosis present

## 2024-06-06 DIAGNOSIS — K219 Gastro-esophageal reflux disease without esophagitis: Secondary | ICD-10-CM | POA: Diagnosis present

## 2024-06-06 DIAGNOSIS — R4701 Aphasia: Secondary | ICD-10-CM | POA: Diagnosis present

## 2024-06-06 DIAGNOSIS — I480 Paroxysmal atrial fibrillation: Secondary | ICD-10-CM | POA: Diagnosis present

## 2024-06-06 DIAGNOSIS — M19011 Primary osteoarthritis, right shoulder: Secondary | ICD-10-CM | POA: Diagnosis not present

## 2024-06-06 DIAGNOSIS — Z1152 Encounter for screening for COVID-19: Secondary | ICD-10-CM

## 2024-06-06 DIAGNOSIS — I129 Hypertensive chronic kidney disease with stage 1 through stage 4 chronic kidney disease, or unspecified chronic kidney disease: Secondary | ICD-10-CM | POA: Diagnosis not present

## 2024-06-06 DIAGNOSIS — R059 Cough, unspecified: Secondary | ICD-10-CM | POA: Diagnosis not present

## 2024-06-06 DIAGNOSIS — Z7982 Long term (current) use of aspirin: Secondary | ICD-10-CM

## 2024-06-06 DIAGNOSIS — R1312 Dysphagia, oropharyngeal phase: Secondary | ICD-10-CM | POA: Diagnosis not present

## 2024-06-06 DIAGNOSIS — R4781 Slurred speech: Secondary | ICD-10-CM | POA: Diagnosis not present

## 2024-06-06 DIAGNOSIS — I6501 Occlusion and stenosis of right vertebral artery: Secondary | ICD-10-CM | POA: Diagnosis present

## 2024-06-06 DIAGNOSIS — M25511 Pain in right shoulder: Secondary | ICD-10-CM | POA: Diagnosis not present

## 2024-06-06 DIAGNOSIS — I6389 Other cerebral infarction: Secondary | ICD-10-CM | POA: Diagnosis not present

## 2024-06-06 DIAGNOSIS — R29810 Facial weakness: Secondary | ICD-10-CM | POA: Diagnosis present

## 2024-06-06 DIAGNOSIS — F1010 Alcohol abuse, uncomplicated: Secondary | ICD-10-CM

## 2024-06-06 DIAGNOSIS — R030 Elevated blood-pressure reading, without diagnosis of hypertension: Secondary | ICD-10-CM

## 2024-06-06 DIAGNOSIS — M79631 Pain in right forearm: Secondary | ICD-10-CM | POA: Diagnosis not present

## 2024-06-06 LAB — COMPREHENSIVE METABOLIC PANEL WITH GFR
ALT: 22 U/L (ref 0–44)
AST: 37 U/L (ref 15–41)
Albumin: 4.3 g/dL (ref 3.5–5.0)
Alkaline Phosphatase: 58 U/L (ref 38–126)
Anion gap: 12 (ref 5–15)
BUN: 16 mg/dL (ref 8–23)
CO2: 23 mmol/L (ref 22–32)
Calcium: 9.5 mg/dL (ref 8.9–10.3)
Chloride: 103 mmol/L (ref 98–111)
Creatinine, Ser: 1.53 mg/dL — ABNORMAL HIGH (ref 0.61–1.24)
GFR, Estimated: 49 mL/min — ABNORMAL LOW (ref >60)
Glucose, Bld: 98 mg/dL (ref 70–99)
Potassium: 3.7 mmol/L (ref 3.5–5.1)
Sodium: 138 mmol/L (ref 135–145)
Total Bilirubin: 2 mg/dL — ABNORMAL HIGH (ref 0.0–1.2)
Total Protein: 7.1 g/dL (ref 6.5–8.1)

## 2024-06-06 LAB — I-STAT CHEM 8, ED
BUN: 21 mg/dL (ref 8–23)
Calcium, Ion: 1.03 mmol/L — ABNORMAL LOW (ref 1.15–1.40)
Chloride: 105 mmol/L (ref 98–111)
Creatinine, Ser: 1.5 mg/dL — ABNORMAL HIGH (ref 0.61–1.24)
Glucose, Bld: 93 mg/dL (ref 70–99)
HCT: 50 % (ref 39.0–52.0)
Hemoglobin: 17 g/dL (ref 13.0–17.0)
Potassium: 4.2 mmol/L (ref 3.5–5.1)
Sodium: 138 mmol/L (ref 135–145)
TCO2: 21 mmol/L — ABNORMAL LOW (ref 22–32)

## 2024-06-06 LAB — CBC
HCT: 47.6 % (ref 39.0–52.0)
Hemoglobin: 17 g/dL (ref 13.0–17.0)
MCH: 37.2 pg — ABNORMAL HIGH (ref 26.0–34.0)
MCHC: 35.7 g/dL (ref 30.0–36.0)
MCV: 104.2 fL — ABNORMAL HIGH (ref 80.0–100.0)
Platelets: 209 10*3/uL (ref 150–400)
RBC: 4.57 MIL/uL (ref 4.22–5.81)
RDW: 14.3 % (ref 11.5–15.5)
WBC: 7.5 10*3/uL (ref 4.0–10.5)
nRBC: 0 % (ref 0.0–0.2)

## 2024-06-06 LAB — DIFFERENTIAL
Abs Immature Granulocytes: 0.05 10*3/uL (ref 0.00–0.07)
Basophils Absolute: 0.1 10*3/uL (ref 0.0–0.1)
Basophils Relative: 1 %
Eosinophils Absolute: 0 10*3/uL (ref 0.0–0.5)
Eosinophils Relative: 1 %
Immature Granulocytes: 1 %
Lymphocytes Relative: 10 %
Lymphs Abs: 0.8 10*3/uL (ref 0.7–4.0)
Monocytes Absolute: 0.7 10*3/uL (ref 0.1–1.0)
Monocytes Relative: 10 %
Neutro Abs: 5.9 10*3/uL (ref 1.7–7.7)
Neutrophils Relative %: 77 %

## 2024-06-06 LAB — APTT: aPTT: 28 s (ref 24–36)

## 2024-06-06 LAB — ETHANOL: Alcohol, Ethyl (B): <15 mg/dL (ref <15)

## 2024-06-06 LAB — PROTIME-INR
INR: 1 (ref 0.8–1.2)
Prothrombin Time: 13.7 s (ref 11.4–15.2)

## 2024-06-06 MED ORDER — IOHEXOL 350 MG/ML SOLN
100.0000 mL | Freq: Once | INTRAVENOUS | Status: AC | PRN
  Administered 2024-06-06: 100 mL via INTRAVENOUS

## 2024-06-06 MED ORDER — LORAZEPAM 2 MG/ML IJ SOLN
INTRAMUSCULAR | Status: AC
Start: 2024-06-06 — End: 2024-06-06
  Filled 2024-06-06: qty 1

## 2024-06-06 MED ORDER — LORAZEPAM 2 MG/ML IJ SOLN
2.0000 mg | Freq: Once | INTRAMUSCULAR | Status: AC
  Administered 2024-06-06: 2 mg via INTRAVENOUS

## 2024-06-06 MED ORDER — LORAZEPAM 2 MG/ML IJ SOLN
INTRAMUSCULAR | Status: AC
  Filled 2024-06-06: qty 1

## 2024-06-06 NOTE — ED Provider Notes (Addendum)
 [facility] EMERGENCY DEPARTMENT AT [facility] Provider Note   CSN: [id number] Arrival date & time: 06/06/24  2208     Patient presents with: Code Stroke   Ronald Lowe is a 68 y.o. male.   Patient presents via EMS as code stroke activation. Pt reportedly last noted at baseline around 0730 this AM, and later was noted to have right sided weakness, and slurred speech. Pt denies headache. No trauma/fall. No fevers. Pt limited historian - level 5 caveat.   The history is provided by the patient, medical records and the EMS personnel. The history is limited by the condition of the patient.       Prior to Admission medications   Medication Sig Start Date End Date Taking? Authorizing Provider  albuterol  (VENTOLIN  HFA) 108 (90 Base) MCG/ACT inhaler INHALE 2 PUFFS BY MOUTH EVERY 4 HOURS AS NEEDED FOR WHEEZE OR FOR SHORTNESS OF BREATH 11/03/23  Yes Icard, Bradley L, DO  atorvastatin  (LIPITOR) 10 MG tablet Take 1 tablet (10 mg total) by mouth daily. 06/14/24 06/14/25 Yes Danford, Lonni SQUIBB, MD  famotidine  (PEPCID ) 40 MG tablet TAKE 1 TABLET EVERY DAY 05/29/24  Yes McClung, Angela M, PA-C  TRELEGY ELLIPTA  100-62.5-25 MCG/ACT AEPB INHALE 1 PUFF EVERY DAY 04/22/24  Yes Tanda Bleacher, MD  aspirin  325 MG tablet Take 1 tablet (325 mg total) by mouth daily. 06/15/24   Danford, Lonni SQUIBB, MD  clopidogrel  (PLAVIX ) 75 MG tablet Take 1 tablet (75 mg total) by mouth daily. 06/15/24   Danford, Lonni SQUIBB, MD  cyanocobalamin  1000 MCG tablet Take 1 tablet (1,000 mcg total) by mouth daily. 06/15/24   Danford, Lonni SQUIBB, MD  feeding supplement (ENSURE PLUS HIGH PROTEIN) LIQD Take 237 mLs by mouth 2 (two) times daily between meals. 06/14/24   Danford, Lonni SQUIBB, MD  folic acid  (FOLVITE ) 1 MG tablet Take 1 tablet (1 mg total) by mouth daily. 06/15/24   Danford, Lonni SQUIBB, MD  lisinopril  (ZESTRIL ) 5 MG tablet TAKE 1 TABLET EVERY DAY Patient not taking: Reported on 06/07/2024 01/11/24    Tanda Bleacher, MD  Multiple Vitamin (MULTIVITAMIN WITH MINERALS) TABS tablet Take 1 tablet by mouth daily. 06/15/24   Danford, Lonni SQUIBB, MD  nicotine  (NICODERM CQ  - DOSED IN MG/24 HOURS) 14 mg/24hr patch Place 1 patch (14 mg total) onto the skin daily. 06/15/24   Danford, Lonni SQUIBB, MD  QUEtiapine  (SEROQUEL ) 25 MG tablet Take 1 tablet (25 mg total) by mouth at bedtime. 06/14/24   Danford, Lonni SQUIBB, MD  thiamine  (VITAMIN B1) 100 MG/ML injection Inject 1 mL (100 mg total) into the vein daily. 06/15/24   Danford, Lonni SQUIBB, MD    Allergies: Patient has no known allergies.    Review of Systems  Constitutional:  Negative for fever.  Respiratory:  Negative for shortness of breath.   Cardiovascular:  Negative for chest pain.  Gastrointestinal:  Negative for abdominal pain and vomiting.  Neurological:  Positive for speech difficulty and weakness.    Updated Vital Signs BP 93/80 (BP Location: Left Arm)   Pulse 92   Temp (!) 97.4 F (36.3 C) (Oral)   Resp 16   Ht 1.829 m (6')   Wt 68.8 kg   SpO2 98%   BMI 20.57 kg/m   Physical Exam Vitals and nursing note reviewed.  Constitutional:      Appearance: Normal appearance. He is well-developed.  HENT:     Head: Atraumatic.     Nose: Nose normal.  Mouth/Throat:     Mouth: Mucous membranes are moist.     Pharynx: Oropharynx is clear.   Eyes:     General: No scleral icterus.    Conjunctiva/sclera: Conjunctivae normal.     Pupils: Pupils are equal, round, and reactive to light.   Neck:     Vascular: No carotid bruit.     Trachea: No tracheal deviation.   Cardiovascular:     Rate and Rhythm: Normal rate and regular rhythm.     Pulses: Normal pulses.     Heart sounds: Normal heart sounds. No murmur heard.    No friction rub. No gallop.  Pulmonary:     Effort: Pulmonary effort is normal. No accessory muscle usage or respiratory distress.     Breath sounds: Normal breath sounds.  Abdominal:     General: Bowel  sounds are normal. There is no distension.     Palpations: Abdomen is soft.     Tenderness: There is no abdominal tenderness.   Musculoskeletal:        General: No swelling.     Cervical back: Normal range of motion and neck supple. No rigidity.   Skin:    General: Skin is warm and dry.     Findings: No rash.   Neurological:     Mental Status: He is alert.     Comments: Awake and alert. Speech dysarthric, ?mildly aphasic. RUE weakness, pronator drift.   Psychiatric:        Mood and Affect: Mood normal.     (all labs ordered are listed, but only abnormal results are displayed) Results for orders placed or performed during the hospital encounter of 06/06/24  CBG monitoring, ED   Collection Time: 06/06/24 10:07 PM  Result Value Ref Range   Glucose-Capillary 95 70 - 99 mg/dL  Urine rapid drug screen (hosp performed)   Collection Time: 06/06/24 10:10 PM  Result Value Ref Range   Opiates NONE DETECTED NONE DETECTED   Cocaine POSITIVE (A) NONE DETECTED   Benzodiazepines POSITIVE (A) NONE DETECTED   Amphetamines POSITIVE (A) NONE DETECTED   Tetrahydrocannabinol POSITIVE (A) NONE DETECTED   Barbiturates NONE DETECTED NONE DETECTED  Ethanol   Collection Time: 06/06/24 10:12 PM  Result Value Ref Range   Alcohol, Ethyl (B) <15 <15 mg/dL  Protime-INR   Collection Time: 06/06/24 10:12 PM  Result Value Ref Range   Prothrombin Time 13.7 11.4 - 15.2 seconds   INR 1.0 0.8 - 1.2  APTT   Collection Time: 06/06/24 10:12 PM  Result Value Ref Range   aPTT 28 24 - 36 seconds  CBC   Collection Time: 06/06/24 10:12 PM  Result Value Ref Range   WBC 7.5 4.0 - 10.5 K/uL   RBC 4.57 4.22 - 5.81 MIL/uL   Hemoglobin 17.0 13.0 - 17.0 g/dL   HCT 52.3 60.9 - 47.9 %   MCV 104.2 (H) 80.0 - 100.0 fL   MCH 37.2 (H) 26.0 - 34.0 pg   MCHC 35.7 30.0 - 36.0 g/dL   RDW 85.6 88.4 - 84.4 %   Platelets 209 150 - 400 K/uL   nRBC 0.0 0.0 - 0.2 %  Differential   Collection Time: 06/06/24 10:12 PM  Result  Value Ref Range   Neutrophils Relative % 77 %   Neutro Abs 5.9 1.7 - 7.7 K/uL   Lymphocytes Relative 10 %   Lymphs Abs 0.8 0.7 - 4.0 K/uL   Monocytes Relative 10 %   Monocytes Absolute 0.7 0.1 -  1.0 K/uL   Eosinophils Relative 1 %   Eosinophils Absolute 0.0 0.0 - 0.5 K/uL   Basophils Relative 1 %   Basophils Absolute 0.1 0.0 - 0.1 K/uL   Immature Granulocytes 1 %   Abs Immature Granulocytes 0.05 0.00 - 0.07 K/uL  Comprehensive metabolic panel   Collection Time: 06/06/24 10:12 PM  Result Value Ref Range   Sodium 138 135 - 145 mmol/L   Potassium 3.7 3.5 - 5.1 mmol/L   Chloride 103 98 - 111 mmol/L   CO2 23 22 - 32 mmol/L   Glucose, Bld 98 70 - 99 mg/dL   BUN 16 8 - 23 mg/dL   Creatinine, Ser 8.46 (H) 0.61 - 1.24 mg/dL   Calcium  9.5 8.9 - 10.3 mg/dL   Total Protein 7.1 6.5 - 8.1 g/dL   Albumin 4.3 3.5 - 5.0 g/dL   AST 37 15 - 41 U/L   ALT 22 0 - 44 U/L   Alkaline Phosphatase 58 38 - 126 U/L   Total Bilirubin 2.0 (H) 0.0 - 1.2 mg/dL   GFR, Estimated 49 (L) >60 mL/min   Anion gap 12 5 - 15  I-stat chem 8, ED   Collection Time: 06/06/24 10:12 PM  Result Value Ref Range   Sodium 138 135 - 145 mmol/L   Potassium 4.2 3.5 - 5.1 mmol/L   Chloride 105 98 - 111 mmol/L   BUN 21 8 - 23 mg/dL   Creatinine, Ser 8.49 (H) 0.61 - 1.24 mg/dL   Glucose, Bld 93 70 - 99 mg/dL   Calcium , Ion 1.03 (L) 1.15 - 1.40 mmol/L   TCO2 21 (L) 22 - 32 mmol/L   Hemoglobin 17.0 13.0 - 17.0 g/dL   HCT 49.9 60.9 - 47.9 %  Lipid panel   Collection Time: 06/07/24  8:36 AM  Result Value Ref Range   Cholesterol 141 0 - 200 mg/dL   Triglycerides 79 <849 mg/dL   HDL 59 >59 mg/dL   Total CHOL/HDL Ratio 2.4 RATIO   VLDL 16 0 - 40 mg/dL   LDL Cholesterol 66 0 - 99 mg/dL  Vitamin B1   Collection Time: 06/07/24  8:36 AM  Result Value Ref Range   Vitamin B1 (Thiamine ) 147.6 66.5 - 200.0 nmol/L  Vitamin B12   Collection Time: 06/07/24  8:36 AM  Result Value Ref Range   Vitamin B-12 180 180 - 914 pg/mL   Folate   Collection Time: 06/07/24  8:36 AM  Result Value Ref Range   Folate 7.2 >5.9 ng/mL  HIV Antibody (routine testing w rflx)   Collection Time: 06/07/24  8:36 AM  Result Value Ref Range   HIV Screen 4th Generation wRfx Non Reactive Non Reactive  CBC   Collection Time: 06/07/24  8:36 AM  Result Value Ref Range   WBC 10.0 4.0 - 10.5 K/uL   RBC 4.59 4.22 - 5.81 MIL/uL   Hemoglobin 17.1 (H) 13.0 - 17.0 g/dL   HCT 51.7 60.9 - 47.9 %   MCV 105.0 (H) 80.0 - 100.0 fL   MCH 37.3 (H) 26.0 - 34.0 pg   MCHC 35.5 30.0 - 36.0 g/dL   RDW 85.7 88.4 - 84.4 %   Platelets 206 150 - 400 K/uL   nRBC 0.0 0.0 - 0.2 %  Basic metabolic panel   Collection Time: 06/07/24  8:36 AM  Result Value Ref Range   Sodium 139 135 - 145 mmol/L   Potassium 3.7 3.5 - 5.1 mmol/L   Chloride  104 98 - 111 mmol/L   CO2 21 (L) 22 - 32 mmol/L   Glucose, Bld 80 70 - 99 mg/dL   BUN 15 8 - 23 mg/dL   Creatinine, Ser 8.80 0.61 - 1.24 mg/dL   Calcium  9.4 8.9 - 10.3 mg/dL   GFR, Estimated >39 >39 mL/min   Anion gap 14 5 - 15  Hemoglobin A1c   Collection Time: 06/07/24  8:36 AM  Result Value Ref Range   Hgb A1c MFr Bld 4.2 (L) 4.8 - 5.6 %   Mean Plasma Glucose 73.84 mg/dL  CBC   Collection Time: 06/08/24  6:53 AM  Result Value Ref Range   WBC 9.0 4.0 - 10.5 K/uL   RBC 4.49 4.22 - 5.81 MIL/uL   Hemoglobin 16.8 13.0 - 17.0 g/dL   HCT 53.5 60.9 - 47.9 %   MCV 103.3 (H) 80.0 - 100.0 fL   MCH 37.4 (H) 26.0 - 34.0 pg   MCHC 36.2 (H) 30.0 - 36.0 g/dL   RDW 86.0 88.4 - 84.4 %   Platelets 178 150 - 400 K/uL   nRBC 0.0 0.0 - 0.2 %  Comprehensive metabolic panel with GFR   Collection Time: 06/08/24  6:53 AM  Result Value Ref Range   Sodium 139 135 - 145 mmol/L   Potassium 3.5 3.5 - 5.1 mmol/L   Chloride 106 98 - 111 mmol/L   CO2 15 (L) 22 - 32 mmol/L   Glucose, Bld 75 70 - 99 mg/dL   BUN 14 8 - 23 mg/dL   Creatinine, Ser 8.92 0.61 - 1.24 mg/dL   Calcium  8.9 8.9 - 10.3 mg/dL   Total Protein 6.4 (L) 6.5 - 8.1  g/dL   Albumin 3.7 3.5 - 5.0 g/dL   AST 40 15 - 41 U/L   ALT 20 0 - 44 U/L   Alkaline Phosphatase 54 38 - 126 U/L   Total Bilirubin 2.2 (H) 0.0 - 1.2 mg/dL   GFR, Estimated >39 >39 mL/min   Anion gap 18 (H) 5 - 15  Magnesium    Collection Time: 06/08/24  6:53 AM  Result Value Ref Range   Magnesium  1.5 (L) 1.7 - 2.4 mg/dL  Comprehensive metabolic panel with GFR   Collection Time: 06/09/24  5:21 AM  Result Value Ref Range   Sodium 137 135 - 145 mmol/L   Potassium 3.5 3.5 - 5.1 mmol/L   Chloride 108 98 - 111 mmol/L   CO2 15 (L) 22 - 32 mmol/L   Glucose, Bld 76 70 - 99 mg/dL   BUN 12 8 - 23 mg/dL   Creatinine, Ser 8.95 0.61 - 1.24 mg/dL   Calcium  8.6 (L) 8.9 - 10.3 mg/dL   Total Protein 6.4 (L) 6.5 - 8.1 g/dL   Albumin 3.7 3.5 - 5.0 g/dL   AST 36 15 - 41 U/L   ALT 19 0 - 44 U/L   Alkaline Phosphatase 52 38 - 126 U/L   Total Bilirubin 1.7 (H) 0.0 - 1.2 mg/dL   GFR, Estimated >39 >39 mL/min   Anion gap 14 5 - 15  ECHOCARDIOGRAM COMPLETE   Collection Time: 06/09/24  3:05 PM  Result Value Ref Range   Weight 2,426.82 oz   Height 72 in   BP 98/73 mmHg   S' Lateral 2.10 cm   AV Peak grad 8.8 mmHg   Ao pk vel 1.48 m/s   Area-P 1/2 2.50 cm2   Est EF 60 - 65%   CBC  Collection Time: 06/10/24  4:56 AM  Result Value Ref Range   WBC 6.1 4.0 - 10.5 K/uL   RBC 3.82 (L) 4.22 - 5.81 MIL/uL   Hemoglobin 14.2 13.0 - 17.0 g/dL   HCT 60.7 60.9 - 47.9 %   MCV 102.6 (H) 80.0 - 100.0 fL   MCH 37.2 (H) 26.0 - 34.0 pg   MCHC 36.2 (H) 30.0 - 36.0 g/dL   RDW 85.8 88.4 - 84.4 %   Platelets 193 150 - 400 K/uL   nRBC 0.0 0.0 - 0.2 %  Comprehensive metabolic panel with GFR   Collection Time: 06/10/24  4:56 AM  Result Value Ref Range   Sodium 137 135 - 145 mmol/L   Potassium 3.3 (L) 3.5 - 5.1 mmol/L   Chloride 109 98 - 111 mmol/L   CO2 16 (L) 22 - 32 mmol/L   Glucose, Bld 84 70 - 99 mg/dL   BUN 12 8 - 23 mg/dL   Creatinine, Ser 8.90 0.61 - 1.24 mg/dL   Calcium  8.7 (L) 8.9 - 10.3 mg/dL    Total Protein 5.7 (L) 6.5 - 8.1 g/dL   Albumin 3.2 (L) 3.5 - 5.0 g/dL   AST 38 15 - 41 U/L   ALT 20 0 - 44 U/L   Alkaline Phosphatase 44 38 - 126 U/L   Total Bilirubin 1.7 (H) 0.0 - 1.2 mg/dL   GFR, Estimated >39 >39 mL/min   Anion gap 12 5 - 15  Magnesium    Collection Time: 06/10/24  4:56 AM  Result Value Ref Range   Magnesium  1.4 (L) 1.7 - 2.4 mg/dL  Phosphorus   Collection Time: 06/10/24  4:56 AM  Result Value Ref Range   Phosphorus 3.2 2.5 - 4.6 mg/dL  ECHOCARDIOGRAM COMPLETE   Collection Time: 06/10/24 10:13 AM  Result Value Ref Range   Est EF Not done   Basic metabolic panel with GFR   Collection Time: 06/11/24  4:45 AM  Result Value Ref Range   Sodium 135 135 - 145 mmol/L   Potassium 3.6 3.5 - 5.1 mmol/L   Chloride 105 98 - 111 mmol/L   CO2 20 (L) 22 - 32 mmol/L   Glucose, Bld 96 70 - 99 mg/dL   BUN 7 (L) 8 - 23 mg/dL   Creatinine, Ser 9.13 0.61 - 1.24 mg/dL   Calcium  8.8 (L) 8.9 - 10.3 mg/dL   GFR, Estimated >39 >39 mL/min   Anion gap 10 5 - 15  Magnesium    Collection Time: 06/11/24  4:45 AM  Result Value Ref Range   Magnesium  1.8 1.7 - 2.4 mg/dL  Basic metabolic panel with GFR   Collection Time: 06/12/24  4:41 AM  Result Value Ref Range   Sodium 135 135 - 145 mmol/L   Potassium 3.9 3.5 - 5.1 mmol/L   Chloride 103 98 - 111 mmol/L   CO2 19 (L) 22 - 32 mmol/L   Glucose, Bld 111 (H) 70 - 99 mg/dL   BUN 9 8 - 23 mg/dL   Creatinine, Ser 9.16 0.61 - 1.24 mg/dL   Calcium  8.9 8.9 - 10.3 mg/dL   GFR, Estimated >39 >39 mL/min   Anion gap 13 5 - 15  Basic metabolic panel with GFR   Collection Time: 06/13/24  4:23 AM  Result Value Ref Range   Sodium 138 135 - 145 mmol/L   Potassium 4.1 3.5 - 5.1 mmol/L   Chloride 106 98 - 111 mmol/L   CO2 17 (L) 22 -  32 mmol/L   Glucose, Bld 94 70 - 99 mg/dL   BUN 15 8 - 23 mg/dL   Creatinine, Ser 9.11 0.61 - 1.24 mg/dL   Calcium  8.7 (L) 8.9 - 10.3 mg/dL   GFR, Estimated >39 >39 mL/min   Anion gap 15 5 - 15  CBC    Collection Time: 06/13/24  4:23 AM  Result Value Ref Range   WBC 5.6 4.0 - 10.5 K/uL   RBC 4.15 (L) 4.22 - 5.81 MIL/uL   Hemoglobin 15.6 13.0 - 17.0 g/dL   HCT 55.0 60.9 - 47.9 %   MCV 108.2 (H) 80.0 - 100.0 fL   MCH 37.6 (H) 26.0 - 34.0 pg   MCHC 34.7 30.0 - 36.0 g/dL   RDW 86.0 88.4 - 84.4 %   Platelets 218 150 - 400 K/uL   nRBC 0.0 0.0 - 0.2 %   VAS US  UPPER EXTREMITY VENOUS DUPLEX Result Date: 06/10/2024 UPPER VENOUS STUDY  Patient Name:  ELADIO DENTREMONT  Date of Exam:   06/09/2024 Medical Rec #: [id number]       Accession #:    [id number] Date of Birth: 1956/05/21       Patient Gender: M Patient Age:   53 years Exam Location:  [facility] Procedure:      VAS US  UPPER EXTREMITY VENOUS DUPLEX Referring Phys: ARY XU --------------------------------------------------------------------------------  Indications: Swelling Risk Factors: None identified. Comparison Study: No prior studies. Performing Technologist: Cordella Collet RVT  Examination Guidelines: A complete evaluation includes B-mode imaging, spectral Doppler, color Doppler, and power Doppler as needed of all accessible portions of each vessel. Bilateral testing is considered an integral part of a complete examination. Limited examinations for reoccurring indications may be performed as noted.  Right Findings: +----------+------------+---------+-----------+----------+-------+ RIGHT     CompressiblePhasicitySpontaneousPropertiesSummary +----------+------------+---------+-----------+----------+-------+ IJV           Full       Yes       Yes                      +----------+------------+---------+-----------+----------+-------+ Subclavian               Yes       Yes                      +----------+------------+---------+-----------+----------+-------+ Axillary      Full       Yes       Yes                      +----------+------------+---------+-----------+----------+-------+ Brachial      Full                                           +----------+------------+---------+-----------+----------+-------+ Radial        Full                                          +----------+------------+---------+-----------+----------+-------+ Ulnar         Full                                          +----------+------------+---------+-----------+----------+-------+  Cephalic      Full                                          +----------+------------+---------+-----------+----------+-------+ Basilic       Full                                          +----------+------------+---------+-----------+----------+-------+  Left Findings: +----------+------------+---------+-----------+----------+-------+ LEFT      CompressiblePhasicitySpontaneousPropertiesSummary +----------+------------+---------+-----------+----------+-------+ Subclavian               Yes       Yes                      +----------+------------+---------+-----------+----------+-------+  Summary:  Right: No evidence of deep vein thrombosis in the upper extremity. No evidence of superficial vein thrombosis in the upper extremity.  Left: No evidence of thrombosis in the subclavian.  *See table(s) above for measurements and observations.  Diagnosing physician: Lonni Gaskins MD Electronically signed by Lonni Gaskins MD on 06/10/2024 at 8:20:12 AM.    Final    VAS US  CAROTID Result Date: 06/10/2024 Carotid Arterial Duplex Study Patient Name:  JENARO SOUDER  Date of Exam:   06/07/2024 Medical Rec #: [id number]       Accession #:    [id number] Date of Birth: Jul 21, 1956       Patient Gender: M Patient Age:   34 years Exam Location:  [facility] Procedure:      VAS US  CAROTID Referring Phys: PRAMOD SETHI --------------------------------------------------------------------------------  Indications:       Carotid artery disease. Risk Factors:      Hypertension, hyperlipidemia, current smoker, coronary artery                     disease, PAD. Other Factors:     GERD, COPD, EtOH, CKD. Comparison Study:  CT Angio neck - Right ICA approximately 55% stenosis.                    Left ICA, nearly occlusive stenosis at the left carotid                    bifurcation with bulky calcific artherosclerosis. Performing Technologist: Ricka Sturdivant-Jones RDMS, RVT  Examination Guidelines: A complete evaluation includes B-mode imaging, spectral Doppler, color Doppler, and power Doppler as needed of all accessible portions of each vessel. Bilateral testing is considered an integral part of a complete examination. Limited examinations for reoccurring indications may be performed as noted.  Right Carotid Findings: +----------+--------+--------+--------+------------------+---------+           PSV cm/sEDV cm/sStenosisPlaque DescriptionComments  +----------+--------+--------+--------+------------------+---------+ CCA Prox  78      18                                          +----------+--------+--------+--------+------------------+---------+ CCA Distal79      22                                          +----------+--------+--------+--------+------------------+---------+ ICA Prox  111  38      1-39%   calcific          Shadowing +----------+--------+--------+--------+------------------+---------+ ICA Mid   102     22                                          +----------+--------+--------+--------+------------------+---------+ ICA Distal78      30                                          +----------+--------+--------+--------+------------------+---------+ ECA       137     26                                          +----------+--------+--------+--------+------------------+---------+ +----------+--------+-------+----------------+-------------------+           PSV cm/sEDV cmsDescribe        Arm Pressure (mmHG) +----------+--------+-------+----------------+-------------------+ Subclavian120             Multiphasic, WNL                    +----------+--------+-------+----------------+-------------------+ +---------+--------+--+--------+--+---------+ VertebralPSV cm/s57EDV cm/s15Antegrade +---------+--------+--+--------+--+---------+  Left Carotid Findings: +----------+--------+--------+--------+------------------+---------+           PSV cm/sEDV cm/sStenosisPlaque DescriptionComments  +----------+--------+--------+--------+------------------+---------+ CCA Prox  61      13                                          +----------+--------+--------+--------+------------------+---------+ CCA Distal44      11                                          +----------+--------+--------+--------+------------------+---------+ ICA Prox  311     90      60-79%  calcific          Shadowing +----------+--------+--------+--------+------------------+---------+ ICA Mid   49      18                                          +----------+--------+--------+--------+------------------+---------+ ICA Distal49      18                                          +----------+--------+--------+--------+------------------+---------+ ECA       43      13                                          +----------+--------+--------+--------+------------------+---------+ +----------+--------+--------+----------------+-------------------+           PSV cm/sEDV cm/sDescribe        Arm Pressure (mmHG) +----------+--------+--------+----------------+-------------------+ [id number]             Multiphasic, WNL                    +----------+--------+--------+----------------+-------------------+ +---------+--------+--+--------+--+---------+  VertebralPSV cm/s85EDV cm/s19Antegrade +---------+--------+--+--------+--+---------+   Summary: Right Carotid: Velocities in the right ICA are consistent with a 1-39% stenosis.                ICA velocities suggest upper end of scale. May be  underestimated                due to heavey calcified plaque shadowing. Left Carotid: Velocities in the left ICA are consistent with a 60-79% stenosis.               ICA velocities suggest upper end of scale. May be underestimated               due to heavy calcified plaque shadowing.  *See table(s) above for measurements and observations.  Electronically signed by Eather Popp MD on 06/10/2024 at 6:44:39 AM.    Final    ECHOCARDIOGRAM COMPLETE Result Date: 06/09/2024    ECHOCARDIOGRAM REPORT   Patient Name:   KEANEN DOHSE Date of Exam: 06/09/2024 Medical Rec #:  [id number]      Height:       72.0 in Accession #:    [id number]     Weight:       151.7 lb Date of Birth:  1956/12/02      BSA:          1.894 m Patient Age:    67 years       BP:           98/73 mmHg Patient Gender: M              HR:           85 bpm. Exam Location:  Inpatient Procedure: 2D Echo, Cardiac Doppler and Color Doppler (Both Spectral and Color            Flow Doppler were utilized during procedure). Indications:    Stroke I63.9  History:        Patient has no prior history of Echocardiogram examinations.                 Stroke and COPD, Arrythmias:Atrial Fibrillation,                 Signs/Symptoms:Hypotension; Risk Factors:Hypertension,                 Dyslipidemia and Current Smoker.  Sonographer:    Thea Norlander RCS Referring Phys: ERIN C LEHNER  Sonographer Comments: Image acquisition challenging due to uncooperative patient. IMPRESSIONS  1. Left ventricular ejection fraction, by estimation, is 60 to 65%. The left ventricle has normal function. The left ventricle has no regional wall motion abnormalities. There is severe asymmetric left ventricular hypertrophy of the septal segment. Left  ventricular diastolic parameters are consistent with Grade I diastolic dysfunction (impaired relaxation).  2. Right ventricular systolic function is normal. The right ventricular size is normal.  3. A small pericardial effusion is present. The  pericardial effusion is localized near the right ventricle. There is no evidence of cardiac tamponade.  4. The mitral valve is abnormal. Trivial mitral valve regurgitation.  5. The aortic valve was not well visualized. Aortic valve regurgitation is not visualized. Aortic valve sclerosis/calcification is present, without any evidence of aortic stenosis. Comparison(s): No prior Echocardiogram. FINDINGS  Left Ventricle: Left ventricular ejection fraction, by estimation, is 60 to 65%. The left ventricle has normal function. The left ventricle has no regional wall motion abnormalities. The left ventricular internal cavity size was normal in size.  There is  severe asymmetric left ventricular hypertrophy of the septal segment. Left ventricular diastolic parameters are consistent with Grade I diastolic dysfunction (impaired relaxation). Indeterminate filling pressures. Right Ventricle: The right ventricular size is normal. No increase in right ventricular wall thickness. Right ventricular systolic function is normal. Left Atrium: Left atrial size was normal in size. Right Atrium: Right atrial size was normal in size. Pericardium: A small pericardial effusion is present. The pericardial effusion is localized near the right ventricle. There is no evidence of cardiac tamponade. Mitral Valve: The mitral valve is abnormal. Mild mitral annular calcification. Trivial mitral valve regurgitation. Tricuspid Valve: The tricuspid valve is not well visualized. Tricuspid valve regurgitation is not demonstrated. Aortic Valve: The aortic valve was not well visualized. Aortic valve regurgitation is not visualized. Aortic valve sclerosis/calcification is present, without any evidence of aortic stenosis. Aortic valve peak gradient measures 8.8 mmHg. Pulmonic Valve: The pulmonic valve was not well visualized. Pulmonic valve regurgitation is not visualized. Aorta: The aortic root and ascending aorta are structurally normal, with no evidence of  dilitation. IAS/Shunts: The interatrial septum was not well visualized.  LEFT VENTRICLE PLAX 2D LVIDd:         3.00 cm Diastology LVIDs:         2.10 cm LV e' medial:    5.00 cm/s LV PW:         1.10 cm LV E/e' medial:  11.9 LV IVS:        1.70 cm LV e' lateral:   8.49 cm/s                        LV E/e' lateral: 7.0  RIGHT VENTRICLE RV S prime:     18.60 cm/s TAPSE (M-mode): 2.7 cm LEFT ATRIUM             Index        RIGHT ATRIUM           Index LA diam:        3.60 cm 1.90 cm/m   RA Area:     13.30 cm LA Vol (A2C):   43.8 ml 23.12 ml/m  RA Volume:   26.30 ml  13.88 ml/m LA Vol (A4C):   54.0 ml 28.51 ml/m LA Biplane Vol: 52.1 ml 27.50 ml/m  AORTIC VALVE AV Vmax:      148.00 cm/s AV Peak Grad: 8.8 mmHg LVOT Vmax:    98.07 cm/s LVOT Vmean:   65.167 cm/s LVOT VTI:     0.154 m MITRAL VALVE MV Area (PHT): 2.50 cm    SHUNTS MV Decel Time: 303 msec    Systemic VTI: 0.15 m MV E velocity: 59.70 cm/s MV A velocity: 84.40 cm/s MV E/A ratio:  0.71 Vinie Maxcy MD Electronically signed by Vinie Maxcy MD Signature Date/Time: 06/09/2024/4:12:17 PM    Final    ECHOCARDIOGRAM COMPLETE Result Date: 06/07/2024    ECHOCARDIOGRAM REPORT   Patient Name:   MARGARET STAGGS Date of Exam: 06/07/2024 Medical Rec #:  [id number]      Height:       72.0 in Accession #:    [id number]     Weight:       151.7 lb Date of Birth:  March 24, 1956      BSA:          1.894 m Patient Age:    67 years       BP:  11/11 mmHg Patient Gender: M              HR:           1 bpm. Exam Location:  Inpatient Procedure: 2D Echo (Both Spectral and Color Flow Doppler were utilized during            procedure). Indications:    stroke  History:        Patient has no prior history of Echocardiogram examinations.  Sonographer:    Koleen Popper Referring Phys: [id number] SRISHTI L BHAGAT IMPRESSIONS  1. Echo not perfomred due to patient non compliance.  2. Left ventricular ejection fraction, by estimation, is Not done%. The left ventricle has not done  function. Left ventricular endocardial border not optimally defined to evaluate regional wall motion. The left ventricular internal cavity size was not done. Left ventricular diastolic function could not be evaluated.  3. Right ventricular systolic function was not well visualized. The right ventricular size is not well visualized.  4. The mitral valve was not assessed. No evidence of mitral valve regurgitation.  5. The aortic valve was not assessed. Aortic valve regurgitation is not visualized.  6. Aortic Not imaged. FINDINGS  Left Ventricle: Left ventricular ejection fraction, by estimation, is Not done%. The left ventricle has not done function. Left ventricular endocardial border not optimally defined to evaluate regional wall motion. Strain was performed and the global longitudinal strain is indeterminate. The left ventricular internal cavity size was not done. Suboptimal image quality limits for assessment of left ventricular hypertrophy. Left ventricular diastolic function could not be evaluated. Right Ventricle: The right ventricular size is not well visualized. Right vetricular wall thickness was not assessed. Right ventricular systolic function was not well visualized. Left Atrium: Left atrial size was not assessed. Right Atrium: Right atrial size was not assessed. Pericardium: The pericardium was not assessed. Mitral Valve: The mitral valve was not assessed. No evidence of mitral valve regurgitation. Tricuspid Valve: The tricuspid valve is not assessed. Tricuspid valve regurgitation is not demonstrated. Aortic Valve: The aortic valve was not assessed. Aortic valve regurgitation is not visualized. Pulmonic Valve: The pulmonic valve was not assessed. Pulmonic valve regurgitation is not visualized. Aorta: Not imaged and the ascending aorta was not well visualized. IAS/Shunts: The interatrial septum was not assessed. Additional Comments: Echo not perfomred due to patient non compliance. 3D was performed not  requiring image post processing on an independent workstation and was indeterminate. Maude Emmer MD Electronically signed by Maude Emmer MD Signature Date/Time: 06/07/2024/3:38:19 PM    Final    DG Shoulder Right Result Date: 06/07/2024 CLINICAL DATA:  Right shoulder pain EXAM: RIGHT SHOULDER - 3 VIEW COMPARISON:  None Available. FINDINGS: There is no evidence of fracture or dislocation. Mild degenerative changes of the glenohumeral joint. Soft tissues are unremarkable. IMPRESSION: No acute fracture or dislocation. Electronically Signed   By: Limin  Xu M.D.   On: 06/07/2024 14:45   DG Forearm Right Result Date: 06/07/2024 CLINICAL DATA:  144615 Pain 144615 EXAM: RIGHT FOREARM - 2 VIEW COMPARISON:  None Available. FINDINGS: No acute fracture or dislocation. There is no evidence of arthropathy or other focal bone abnormality. Soft tissues are unremarkable. IMPRESSION: No acute fracture or dislocation. Electronically Signed   By: Rogelia Myers M.D.   On: 06/07/2024 10:57   DG Chest Portable 1 View Result Date: 06/07/2024 EXAM: 1 VIEW XRAY OF THE CHEST 06/07/2024 01:01:32 AM COMPARISON: CT chest dated 01/29/2024. CLINICAL HISTORY: Coughing. FINDINGS: LUNGS AND PLEURA: No  focal pulmonary opacity. No pulmonary edema. No pleural effusion. No pneumothorax. HEART AND MEDIASTINUM: No acute abnormality of the cardiac and mediastinal silhouettes. Thoracic aortic atherosclerosis. BONES AND SOFT TISSUES: No acute osseous abnormality. IMPRESSION: 1. No acute findings. Electronically signed by: Pinkie Pebbles MD 06/07/2024 01:04 AM EDT RP Workstation: [id number]   MR BRAIN WO CONTRAST Result Date: 06/06/2024 CLINICAL DATA:  Neuro deficit, acute, stroke suspected EXAM: MRI HEAD WITHOUT CONTRAST TECHNIQUE: Multiplanar, multiecho pulse sequences of the brain and surrounding structures were obtained without intravenous contrast. COMPARISON:  Same day CTA head/neck and CT head. FINDINGS: Brain: Multiple acute infarcts  in the left MCA and left PCA territories, including the left basal ganglia, left posterior limb of the internal capsule, left anterior insula, overlying left frontal and parietal lobes and the left occipital lobe. Mild associated edema in the anterior insula. No mass effect. No midline shift. Additional moderate patchy T2/FLAIR hyperintensities, compatible with chronic microvascular ischemic disease. No mass lesion or hydrocephalus. No extra-axial fluid collections. Vascular: Better evaluated on same day CTA. Skull and upper cervical spine: Normal marrow signal. Sinuses/Orbits: Clear sinuses.  No acute orbital findings. Other: No mastoid effusions. IMPRESSION: Multiple acute infarcts in the left MCA and left PCA territories, including the left basal ganglia, left posterior limb of the internal capsule, left anterior insula, overlying left frontal and parietal lobes and the left occipital lobe. No mass effect. Electronically Signed   By: Gilmore GORMAN Molt M.D.   On: 06/06/2024 23:29   CT ANGIO HEAD NECK W WO CM W PERF (CODE STROKE) Result Date: 06/06/2024 CLINICAL DATA:  Neuro deficit, acute, stroke suspected EXAM: CT ANGIOGRAPHY HEAD AND NECK CT PERFUSION BRAIN TECHNIQUE: Multidetector CT imaging of the head and neck was performed using the standard protocol during bolus administration of intravenous contrast. Multiplanar CT image reconstructions and MIPs were obtained to evaluate the vascular anatomy. Carotid stenosis measurements (when applicable) are obtained utilizing NASCET criteria, using the distal internal carotid diameter as the denominator. Multiphase CT imaging of the brain was performed following IV bolus contrast injection. Subsequent parametric perfusion maps were calculated using RAPID software. RADIATION DOSE REDUCTION: This exam was performed according to the departmental dose-optimization program which includes automated exposure control, adjustment of the mA and/or kV according to patient size  and/or use of iterative reconstruction technique. CONTRAST:  OMNIPAQUE  IOHEXOL  350 MG/ML SOLN COMPARISON:  Same day CT head. FINDINGS: CTA NECK FINDINGS Aortic arch: Aortic atherosclerosis. Great vessel origins are patent. Right carotid system: Atherosclerosis at the carotid bifurcation and involving the proximal ICA with approximately 55% stenosis. Left carotid system: Critical, nearly occlusive stenosis at the left carotid bifurcation. The degree of stenosis is difficult to quantify given the degree of bulky calcific atherosclerosis but likely at least 80%. More distal ICA is patent but diminutive. Vertebral arteries: Right dominant. Mild bilateral vertebral artery origin stenosis. Moderate right V2 vertebral artery stenosis due to compression from adjacent osteophyte. Skeleton: Severe multilevel degenerative change in the cervical spine. No acute abnormality on limited assessment. Other neck: No acute abnormality on limited assessment. Upper chest: Emphysema.  Visualized lung apices are clear. Review of the MIP images confirms the above findings CTA HEAD FINDINGS Anterior circulation: Bilateral intracranial ICAs are patent without hemodynamically significant stenosis. Bilateral M1 MCAs and the ACAs are patent. Hypoplastic left A1 ACA. Occluded proximal left M3 MCA just above the anterior sylvian fissure (for example see series 8, images 112 through 114). Posterior circulation: Bilateral intradural vertebral arteries, basilar artery and bilateral posterior  cerebral arteries are patent without proximal hemodynamically significant stenosis. Venous sinuses: As permitted by contrast timing, patent. Review of the MIP images confirms the above findings CT Brain Perfusion Findings: ASPECTS: 9 CBF (<30%) Volume: 0mL Perfusion (Tmax>6.0s) volume: Mismatch Volume: spanning multiple vascular territories bilaterally both infratentorial and supratentorial and therefore likely largely artifactual. Infarction  Location:No core infarct identified. IMPRESSION: CTA: 1. Occluded proximal left M3 MCA. 2. Critical, nearly occlusive stenosis at the left carotid bifurcation. The degree of stenosis is difficult to quantify given the degree of bulky calcific atherosclerosis but likely at least 80%. 3. Approximately 55% stenosis of the right ICA. 4. Moderate right V2 vertebral artery stenosis due to compression from adjacent osteophyte. CTP: Nondiagnostic perfusion. Findings discussed with Dr. Jerrie via telephone at 10:30 p.m. Electronically Signed   By: Gilmore GORMAN Molt M.D.   On: 06/06/2024 22:48   CT HEAD CODE STROKE WO CONTRAST Result Date: 06/06/2024 CLINICAL DATA:  Code stroke.  Neuro deficit, acute, stroke suspected EXAM: CT HEAD WITHOUT CONTRAST TECHNIQUE: Contiguous axial images were obtained from the base of the skull through the vertex without intravenous contrast. RADIATION DOSE REDUCTION: This exam was performed according to the departmental dose-optimization program which includes automated exposure control, adjustment of the mA and/or kV according to patient size and/or use of iterative reconstruction technique. COMPARISON:  None Available. FINDINGS: Brain: No evidence of acute infarction, hemorrhage, hydrocephalus, extra-axial collection or mass lesion/mass effect. Moderate patchy white matter hypodensities, compatible with chronic microvascular ischemic disease. Vascular: No hyperdense vessel identified. Skull: Normal. Negative for fracture or focal lesion. Sinuses/Orbits: Clear sinuses.  No acute orbital findings. Other: No mastoid effusions. ASPECTS [facility] Stroke Program Early CT Score) Total score (0-10 with 10 being normal): 9. IMPRESSION: 1. Subtle hypoattenuation in the anterior left insula, concerning for acute infarct. ASPECTS 9. 2. Moderate chronic microvascular ischemic disease. Findings discussed with Dr. Jerrie via telephone at 10:30 p.m. Electronically Signed   By: Gilmore GORMAN Molt M.D.   On:  06/06/2024 22:37    ED ECG REPORT   Date:101  Rhythm: sinus tachycardia  QRS Axis: normal  Intervals: normal  ST/T Wave abnormalities: nonspecific ST/T changes  Conduction Disutrbances:none  Narrative Interpretation:   Old EKG Reviewed: reviewed  I have personally reviewed the EKG tracing  Radiology: No results found.    Procedures   Medications Ordered in the ED  LORazepam  (ATIVAN ) tablet 1-4 mg ( Oral See Alternative 06/09/24 0030)    Or  LORazepam  (ATIVAN ) injection 1-4 mg (2 mg Intravenous Given 06/09/24 0030)  lactated ringers  infusion ( Intravenous Transfusing/Transfer 06/08/24 0636)  lactated ringers  infusion (0 mLs Intravenous Stopped 06/10/24 0710)  lactated ringers  infusion (0 mLs Intravenous Stopped 06/11/24 0700)  lactated ringers  infusion (0 mLs Intravenous Stopped 06/12/24 0812)  lactated ringers  infusion ( Intravenous New Bag/Given 06/13/24 0658)  LORazepam  (ATIVAN ) injection 2 mg (2 mg Intravenous Given 06/06/24 2217)  iohexol  (OMNIPAQUE ) 350 MG/ML injection 100 mL (100 mLs Intravenous Contrast Given 06/06/24 2229)   stroke: early stages of recovery book ( Does not apply Given 06/08/24 0946)  thiamine  (VITAMIN B1) 500 mg in sodium chloride  0.9 % 50 mL IVPB (500 mg Intravenous New Bag/Given 06/09/24 0025)    Followed by  thiamine  (VITAMIN B1) 250 mg in sodium chloride  0.9 % 50 mL IVPB (250 mg Intravenous New Bag/Given 06/14/24 1032)  cyanocobalamin  (VITAMIN B12) injection 1,000 mcg (1,000 mcg Intramuscular Given 06/08/24 1744)  potassium chloride  10 mEq in 100 mL IVPB (0 mEq Intravenous Stopped 06/10/24  1512)  magnesium  sulfate IVPB 4 g 100 mL (0 g Intravenous Stopped 06/10/24 0852)  magnesium  sulfate IVPB 2 g 50 mL (0 g Intravenous Stopped 06/11/24 0843)  potassium chloride  (KLOR-CON  M) CR tablet 30 mEq (30 mEq Oral Given 06/11/24 1226)                                    Medical Decision Making Problems Addressed: Acute CVA (cerebrovascular accident) [facility]): acute  illness or injury with systemic symptoms that poses a threat to life or bodily functions Elevated blood pressure reading: acute illness or injury Essential hypertension: chronic illness or injury with exacerbation, progression, or side effects of treatment that poses a threat to life or bodily functions Paroxysmal atrial fibrillation (HCC): chronic illness or injury with exacerbation, progression, or side effects of treatment that poses a threat to life or bodily functions  Amount and/or Complexity of Data Reviewed Independent Historian: EMS    Details: hx External Data Reviewed: notes. Labs: ordered. Decision-making details documented in ED Course. Radiology: ordered and independent interpretation performed. Decision-making details documented in ED Course. ECG/medicine tests: ordered and independent interpretation performed. Decision-making details documented in ED Course. Discussion of management or test interpretation with external provider(s): Neurology, medicine  Risk Prescription drug management. Decision regarding hospitalization.   Iv ns. Continuous pulse ox and cardiac monitoring. Labs ordered/sent. Imaging ordered.   Arrives as code stroke activation - neurology stat paged and met patient at bridge.   Differential diagnosis includes cva, tia, etc. Dispo decision including potential need for admission considered - will get labs and imaging and reassess.   Reviewed nursing notes and prior charts for additional history. External reports reviewed. Additional history from: EMS.  Cardiac monitor: sinus rhythm, rate 100.  Labs reviewed/interpreted by me - wbc and hgb normal. K normal. Glucose normal.   CT reviewed/interpreted by me - no hem. Cta pending.   Dr Jerrie indicates no plan for intervention tonight. Indicates admit to hospitalists.   Hospitalists consulted for admission. Patient signed out to Dr Nettie to facilitate hospitalists admission.   CRITICAL CARE RE: acute  cva with code stroke activation, emergent neurology evaluation, ct/cta/mri Performed by: Alon Mazor E Corrie Brannen Total critical care time: 45 minutes Critical care time was exclusive of separately billable procedures and treating other patients. Critical care was necessary to treat or prevent imminent or life-threatening deterioration. Critical care was time spent personally by me on the following activities: development of treatment plan with patient and/or surrogate as well as nursing, discussions with consultants, evaluation of patient's response to treatment, examination of patient, obtaining history from patient or surrogate, ordering and performing treatments and interventions, ordering and review of laboratory studies, ordering and review of radiographic studies, pulse oximetry and re-evaluation of patient's condition.       Final diagnoses:  Acute CVA (cerebrovascular accident) (HCC)  Elevated blood pressure reading  Essential hypertension  Paroxysmal atrial fibrillation (HCC)  Long QT interval    ED Discharge Orders          Ordered    aspirin  325 MG tablet  Daily        06/14/24 1037    clopidogrel  (PLAVIX ) 75 MG tablet  Daily        06/14/24 1037    cyanocobalamin  1000 MCG tablet  Daily        06/14/24 1037    folic acid  (FOLVITE ) 1 MG tablet  Daily  06/14/24 1037    Multiple Vitamin (MULTIVITAMIN WITH MINERALS) TABS tablet  Daily        06/14/24 1037    nicotine  (NICODERM CQ  - DOSED IN MG/24 HOURS) 14 mg/24hr patch  Daily        06/14/24 1037    thiamine  (VITAMIN B1) 100 MG/ML injection  Daily        06/14/24 1037    feeding supplement (ENSURE PLUS HIGH PROTEIN) LIQD  2 times daily between meals        06/14/24 1037    QUEtiapine  (SEROQUEL ) 25 MG tablet  Daily at bedtime        06/14/24 1037    Increase activity slowly        06/14/24 1037    Diet - low sodium heart healthy        06/14/24 1037    atorvastatin  (LIPITOR) 10 MG tablet  Daily        06/14/24 1038     Ambulatory referral to Neurology       Comments: Follow up with stroke clinic NP at [facility] in about 4-6 weeks. Thanks.   06/11/24 1559               Bernard Drivers, MD 06/06/24 7673    Bernard Drivers, MD 06/17/24 1046

## 2024-06-06 NOTE — Code Documentation (Signed)
 Stroke Response Nurse Documentation Code Documentation  Ronald Lowe is a 68 y.o. male arriving to [facility]  via [facility] on 6/19 with past medical hx of HTN, HLD, afib, tobacco use, COPD and ETOH. On No antithrombotic. Code stroke was activated by EMS.   Patient from home where he was LKW at 0730 and now complaining of right sided weakness and left gaze.   Stroke team at the bedside on patient arrival. Labs drawn and patient cleared for CT by Dr. Moses Arenas. Patient to CT with team. NIHSS 8, see documentation for details and code stroke times. Patient with left gaze preference , right hemianopia, right facial droop, right arm weakness, and dysarthria  on exam. The following imaging was completed:  CT Head, CTA, and MRI. Patient is not a candidate for IV Thrombolytic due to outside of window. Patient is not a candidate for IR due to distal occlusion.   Care Plan: Neuro checks q2 hrs.  Bedside handoff with ED RN Dreac.    Dodson Freestone  Rapid Response RN

## 2024-06-06 NOTE — ED Triage Notes (Signed)
 PT brought in from home by EMS as code stroke. PT last known well was 0730 this morning and discovered to haveR arm drift slurred speech and L gaze problems. PT arrived and taken straight to CT. PT has no history of stroke, PT glucose was 95. PT is alert to name and location.

## 2024-06-07 ENCOUNTER — Observation Stay (HOSPITAL_COMMUNITY)

## 2024-06-07 ENCOUNTER — Encounter (HOSPITAL_COMMUNITY): Payer: Self-pay | Admitting: Internal Medicine

## 2024-06-07 DIAGNOSIS — R29708 NIHSS score 8: Secondary | ICD-10-CM | POA: Diagnosis not present

## 2024-06-07 DIAGNOSIS — I6529 Occlusion and stenosis of unspecified carotid artery: Secondary | ICD-10-CM | POA: Diagnosis not present

## 2024-06-07 DIAGNOSIS — G9341 Metabolic encephalopathy: Secondary | ICD-10-CM | POA: Diagnosis present

## 2024-06-07 DIAGNOSIS — I63532 Cerebral infarction due to unspecified occlusion or stenosis of left posterior cerebral artery: Secondary | ICD-10-CM

## 2024-06-07 DIAGNOSIS — I1310 Hypertensive heart and chronic kidney disease without heart failure, with stage 1 through stage 4 chronic kidney disease, or unspecified chronic kidney disease: Secondary | ICD-10-CM | POA: Diagnosis present

## 2024-06-07 DIAGNOSIS — R410 Disorientation, unspecified: Secondary | ICD-10-CM | POA: Diagnosis not present

## 2024-06-07 DIAGNOSIS — F10231 Alcohol dependence with withdrawal delirium: Secondary | ICD-10-CM | POA: Diagnosis present

## 2024-06-07 DIAGNOSIS — F1210 Cannabis abuse, uncomplicated: Secondary | ICD-10-CM | POA: Diagnosis not present

## 2024-06-07 DIAGNOSIS — N179 Acute kidney failure, unspecified: Secondary | ICD-10-CM | POA: Diagnosis present

## 2024-06-07 DIAGNOSIS — I639 Cerebral infarction, unspecified: Secondary | ICD-10-CM | POA: Diagnosis present

## 2024-06-07 DIAGNOSIS — R1310 Dysphagia, unspecified: Secondary | ICD-10-CM | POA: Diagnosis present

## 2024-06-07 DIAGNOSIS — I63512 Cerebral infarction due to unspecified occlusion or stenosis of left middle cerebral artery: Secondary | ICD-10-CM | POA: Diagnosis present

## 2024-06-07 DIAGNOSIS — R1312 Dysphagia, oropharyngeal phase: Secondary | ICD-10-CM | POA: Diagnosis not present

## 2024-06-07 DIAGNOSIS — Z1152 Encounter for screening for COVID-19: Secondary | ICD-10-CM | POA: Diagnosis not present

## 2024-06-07 DIAGNOSIS — I6389 Other cerebral infarction: Secondary | ICD-10-CM

## 2024-06-07 DIAGNOSIS — E785 Hyperlipidemia, unspecified: Secondary | ICD-10-CM | POA: Insufficient documentation

## 2024-06-07 DIAGNOSIS — I10 Essential (primary) hypertension: Secondary | ICD-10-CM | POA: Diagnosis not present

## 2024-06-07 DIAGNOSIS — F17210 Nicotine dependence, cigarettes, uncomplicated: Secondary | ICD-10-CM | POA: Diagnosis not present

## 2024-06-07 DIAGNOSIS — M7989 Other specified soft tissue disorders: Secondary | ICD-10-CM | POA: Diagnosis not present

## 2024-06-07 DIAGNOSIS — F17200 Nicotine dependence, unspecified, uncomplicated: Secondary | ICD-10-CM | POA: Diagnosis not present

## 2024-06-07 DIAGNOSIS — Z7901 Long term (current) use of anticoagulants: Secondary | ICD-10-CM | POA: Diagnosis not present

## 2024-06-07 DIAGNOSIS — M79631 Pain in right forearm: Secondary | ICD-10-CM | POA: Diagnosis not present

## 2024-06-07 DIAGNOSIS — I2510 Atherosclerotic heart disease of native coronary artery without angina pectoris: Secondary | ICD-10-CM | POA: Diagnosis present

## 2024-06-07 DIAGNOSIS — I6522 Occlusion and stenosis of left carotid artery: Secondary | ICD-10-CM | POA: Diagnosis not present

## 2024-06-07 DIAGNOSIS — Z7951 Long term (current) use of inhaled steroids: Secondary | ICD-10-CM | POA: Diagnosis not present

## 2024-06-07 DIAGNOSIS — F1510 Other stimulant abuse, uncomplicated: Secondary | ICD-10-CM | POA: Diagnosis not present

## 2024-06-07 DIAGNOSIS — I69391 Dysphagia following cerebral infarction: Secondary | ICD-10-CM | POA: Diagnosis not present

## 2024-06-07 DIAGNOSIS — I700 Atherosclerosis of aorta: Secondary | ICD-10-CM | POA: Diagnosis not present

## 2024-06-07 DIAGNOSIS — R059 Cough, unspecified: Secondary | ICD-10-CM | POA: Diagnosis not present

## 2024-06-07 DIAGNOSIS — J449 Chronic obstructive pulmonary disease, unspecified: Secondary | ICD-10-CM | POA: Diagnosis present

## 2024-06-07 DIAGNOSIS — R29713 NIHSS score 13: Secondary | ICD-10-CM | POA: Diagnosis present

## 2024-06-07 DIAGNOSIS — I480 Paroxysmal atrial fibrillation: Secondary | ICD-10-CM | POA: Diagnosis present

## 2024-06-07 DIAGNOSIS — K219 Gastro-esophageal reflux disease without esophagitis: Secondary | ICD-10-CM | POA: Diagnosis present

## 2024-06-07 DIAGNOSIS — N1831 Chronic kidney disease, stage 3a: Secondary | ICD-10-CM | POA: Diagnosis present

## 2024-06-07 DIAGNOSIS — I6523 Occlusion and stenosis of bilateral carotid arteries: Secondary | ICD-10-CM | POA: Diagnosis present

## 2024-06-07 DIAGNOSIS — Z781 Physical restraint status: Secondary | ICD-10-CM | POA: Diagnosis not present

## 2024-06-07 DIAGNOSIS — E876 Hypokalemia: Secondary | ICD-10-CM | POA: Diagnosis present

## 2024-06-07 DIAGNOSIS — F1010 Alcohol abuse, uncomplicated: Secondary | ICD-10-CM | POA: Diagnosis not present

## 2024-06-07 DIAGNOSIS — K5901 Slow transit constipation: Secondary | ICD-10-CM | POA: Diagnosis not present

## 2024-06-07 DIAGNOSIS — F1410 Cocaine abuse, uncomplicated: Secondary | ICD-10-CM | POA: Diagnosis present

## 2024-06-07 DIAGNOSIS — R4701 Aphasia: Secondary | ICD-10-CM | POA: Diagnosis present

## 2024-06-07 DIAGNOSIS — F1910 Other psychoactive substance abuse, uncomplicated: Secondary | ICD-10-CM | POA: Diagnosis not present

## 2024-06-07 DIAGNOSIS — R451 Restlessness and agitation: Secondary | ICD-10-CM | POA: Diagnosis not present

## 2024-06-07 DIAGNOSIS — M19011 Primary osteoarthritis, right shoulder: Secondary | ICD-10-CM | POA: Diagnosis not present

## 2024-06-07 DIAGNOSIS — I4891 Unspecified atrial fibrillation: Secondary | ICD-10-CM | POA: Diagnosis not present

## 2024-06-07 DIAGNOSIS — F1090 Alcohol use, unspecified, uncomplicated: Secondary | ICD-10-CM | POA: Diagnosis not present

## 2024-06-07 DIAGNOSIS — M25511 Pain in right shoulder: Secondary | ICD-10-CM | POA: Diagnosis not present

## 2024-06-07 DIAGNOSIS — Z79899 Other long term (current) drug therapy: Secondary | ICD-10-CM | POA: Diagnosis not present

## 2024-06-07 DIAGNOSIS — M79601 Pain in right arm: Secondary | ICD-10-CM | POA: Insufficient documentation

## 2024-06-07 DIAGNOSIS — E538 Deficiency of other specified B group vitamins: Secondary | ICD-10-CM | POA: Diagnosis not present

## 2024-06-07 LAB — CBC
HCT: 48.2 % (ref 39.0–52.0)
Hemoglobin: 17.1 g/dL — ABNORMAL HIGH (ref 13.0–17.0)
MCH: 37.3 pg — ABNORMAL HIGH (ref 26.0–34.0)
MCHC: 35.5 g/dL (ref 30.0–36.0)
MCV: 105 fL — ABNORMAL HIGH (ref 80.0–100.0)
Platelets: 206 10*3/uL (ref 150–400)
RBC: 4.59 MIL/uL (ref 4.22–5.81)
RDW: 14.2 % (ref 11.5–15.5)
WBC: 10 10*3/uL (ref 4.0–10.5)
nRBC: 0 % (ref 0.0–0.2)

## 2024-06-07 LAB — ECHOCARDIOGRAM COMPLETE

## 2024-06-07 LAB — LIPID PANEL
Cholesterol: 141 mg/dL (ref 0–200)
HDL: 59 mg/dL (ref >40)
LDL Cholesterol: 66 mg/dL (ref 0–99)
Total CHOL/HDL Ratio: 2.4 ratio
Triglycerides: 79 mg/dL (ref <150)
VLDL: 16 mg/dL (ref 0–40)

## 2024-06-07 LAB — CBG MONITORING, ED: Glucose-Capillary: 95 mg/dL (ref 70–99)

## 2024-06-07 LAB — BASIC METABOLIC PANEL WITH GFR
Anion gap: 14 (ref 5–15)
BUN: 15 mg/dL (ref 8–23)
CO2: 21 mmol/L — ABNORMAL LOW (ref 22–32)
Calcium: 9.4 mg/dL (ref 8.9–10.3)
Chloride: 104 mmol/L (ref 98–111)
Creatinine, Ser: 1.19 mg/dL (ref 0.61–1.24)
GFR, Estimated: >60 mL/min (ref >60)
Glucose, Bld: 80 mg/dL (ref 70–99)
Potassium: 3.7 mmol/L (ref 3.5–5.1)
Sodium: 139 mmol/L (ref 135–145)

## 2024-06-07 LAB — RAPID URINE DRUG SCREEN, HOSP PERFORMED
Amphetamines: POSITIVE — AB
Barbiturates: NOT DETECTED
Benzodiazepines: POSITIVE — AB
Cocaine: POSITIVE — AB
Opiates: NOT DETECTED
Tetrahydrocannabinol: POSITIVE — AB

## 2024-06-07 LAB — HIV ANTIBODY (ROUTINE TESTING W REFLEX): HIV Screen 4th Generation wRfx: NONREACTIVE

## 2024-06-07 LAB — HEMOGLOBIN A1C
Hgb A1c MFr Bld: 4.2 % — ABNORMAL LOW (ref 4.8–5.6)
Mean Plasma Glucose: 73.84 mg/dL

## 2024-06-07 LAB — FOLATE: Folate: 7.2 ng/mL (ref >5.9)

## 2024-06-07 LAB — VITAMIN B12: Vitamin B-12: 180 pg/mL (ref 180–914)

## 2024-06-07 MED ORDER — PANTOPRAZOLE SODIUM 40 MG IV SOLR
40.0000 mg | INTRAVENOUS | Status: DC
  Administered 2024-06-07 – 2024-06-13 (×7): 40 mg via INTRAVENOUS
  Filled 2024-06-07 (×7): qty 10

## 2024-06-07 MED ORDER — LABETALOL HCL 5 MG/ML IV SOLN
10.0000 mg | INTRAVENOUS | Status: DC | PRN
  Filled 2024-06-07: qty 4

## 2024-06-07 MED ORDER — ACETAMINOPHEN 650 MG RE SUPP: 650.0000 mg | RECTAL | Status: DC | PRN

## 2024-06-07 MED ORDER — LACTATED RINGERS IV SOLN: INTRAVENOUS | Status: AC

## 2024-06-07 MED ORDER — LORAZEPAM 2 MG/ML IJ SOLN
1.0000 mg | INTRAMUSCULAR | Status: AC | PRN
  Administered 2024-06-07: 2 mg via INTRAVENOUS
  Administered 2024-06-07: 1 mg via INTRAVENOUS
  Administered 2024-06-07 – 2024-06-09 (×6): 2 mg via INTRAVENOUS
  Filled 2024-06-07 (×8): qty 1

## 2024-06-07 MED ORDER — ALBUTEROL SULFATE (2.5 MG/3ML) 0.083% IN NEBU: 2.5000 mg | INHALATION_SOLUTION | RESPIRATORY_TRACT | Status: DC | PRN

## 2024-06-07 MED ORDER — BUDESON-GLYCOPYRROL-FORMOTEROL 160-9-4.8 MCG/ACT IN AERO
2.0000 | INHALATION_SPRAY | Freq: Two times a day (BID) | RESPIRATORY_TRACT | Status: DC
  Administered 2024-06-08: 2 via RESPIRATORY_TRACT
  Filled 2024-06-07: qty 5.9

## 2024-06-07 MED ORDER — THIAMINE HCL 100 MG/ML IJ SOLN
250.0000 mg | Freq: Every day | INTRAVENOUS | Status: AC
  Administered 2024-06-09 – 2024-06-14 (×6): 250 mg via INTRAVENOUS
  Filled 2024-06-07 (×6): qty 2.5

## 2024-06-07 MED ORDER — ADULT MULTIVITAMIN W/MINERALS CH
1.0000 | ORAL_TABLET | Freq: Every day | ORAL | Status: DC
  Administered 2024-06-08 – 2024-06-14 (×7): 1 via ORAL
  Filled 2024-06-07 (×7): qty 1

## 2024-06-07 MED ORDER — ACETAMINOPHEN 325 MG PO TABS
650.0000 mg | ORAL_TABLET | ORAL | Status: DC | PRN
  Administered 2024-06-12: 650 mg via ORAL
  Filled 2024-06-07: qty 2

## 2024-06-07 MED ORDER — NICOTINE 14 MG/24HR TD PT24
14.0000 mg | MEDICATED_PATCH | Freq: Every day | TRANSDERMAL | Status: DC
  Administered 2024-06-07 – 2024-06-14 (×8): 14 mg via TRANSDERMAL
  Filled 2024-06-07 (×8): qty 1

## 2024-06-07 MED ORDER — THIAMINE HCL 100 MG/ML IJ SOLN
500.0000 mg | Freq: Three times a day (TID) | INTRAVENOUS | Status: AC
  Administered 2024-06-07 – 2024-06-09 (×6): 500 mg via INTRAVENOUS
  Filled 2024-06-07 (×7): qty 5

## 2024-06-07 MED ORDER — ASPIRIN 325 MG PO TABS
325.0000 mg | ORAL_TABLET | Freq: Every day | ORAL | Status: DC
  Administered 2024-06-08 – 2024-06-14 (×6): 325 mg via ORAL
  Filled 2024-06-07 (×8): qty 1

## 2024-06-07 MED ORDER — LORAZEPAM 1 MG PO TABS: 1.0000 mg | ORAL_TABLET | ORAL | Status: AC | PRN

## 2024-06-07 MED ORDER — STROKE: EARLY STAGES OF RECOVERY BOOK
Freq: Once | Status: AC
  Filled 2024-06-07: qty 1

## 2024-06-07 MED ORDER — ENOXAPARIN SODIUM 40 MG/0.4ML IJ SOSY
40.0000 mg | PREFILLED_SYRINGE | INTRAMUSCULAR | Status: DC
  Administered 2024-06-07 – 2024-06-13 (×7): 40 mg via SUBCUTANEOUS
  Filled 2024-06-07 (×7): qty 0.4

## 2024-06-07 MED ORDER — SODIUM CHLORIDE 0.9% FLUSH: 3.0000 mL | INTRAVENOUS | Status: DC | PRN

## 2024-06-07 MED ORDER — ACETAMINOPHEN 160 MG/5ML PO SOLN: 650.0000 mg | ORAL | Status: DC | PRN

## 2024-06-07 MED ORDER — FOLIC ACID 1 MG PO TABS
1.0000 mg | ORAL_TABLET | Freq: Every day | ORAL | Status: DC
  Administered 2024-06-08 – 2024-06-14 (×7): 1 mg via ORAL
  Filled 2024-06-07 (×7): qty 1

## 2024-06-07 MED ORDER — THIAMINE HCL 100 MG/ML IJ SOLN: 100.0000 mg | Freq: Every day | INTRAMUSCULAR | Status: DC

## 2024-06-07 MED ORDER — LORAZEPAM 2 MG/ML IJ SOLN
1.0000 mg | Freq: Once | INTRAMUSCULAR | Status: DC
  Filled 2024-06-07: qty 1

## 2024-06-07 MED ORDER — ASPIRIN 300 MG RE SUPP
300.0000 mg | Freq: Every day | RECTAL | Status: DC
  Administered 2024-06-07: 300 mg via RECTAL
  Filled 2024-06-07: qty 1

## 2024-06-07 NOTE — Consult Note (Signed)
 NEUROLOGY CONSULT NOTE   Date of service: June 07, 2024 Patient Name: Ronald Lowe MRN:  [id number] DOB:  [birth year] Chief Complaint: Sitting on the floor and looking confused Requesting Provider: Angelene Kelly, MD  History of Present Illness  Ronald Lowe is a 68 y.o. male with hx of daily alcohol use, hypertension, atrial fibrillation not on anticoagulation (secondary to cost per family, per chart review patient reported not liking how the medication made him feel), ongoing smoking, positive hep C antibody test with negative RNA (09/2019),  Of note wife's phone number was not in service but neighbor Vertell Gory was with the patient's wife and I was able to obtain history from them.  They confirmed that patient was last seen at his normal self at 7:30 AM when he left for work.  At 6 PM he was found sitting on the floor confused.  They note he has been having some episodes of confusion and they are concerned about possible dementia versus these episodes being related to heavy drinking.  They also note he has been losing weight and not eating well.  LKW: 7:30 AM Modified rankin score: 1 some episodic confusion but still working IV Thrombolysis: No, out of the window, at least some DWI lesions with matched FLAIR changes on MRI EVT: No, risk/benefit profile not felt to be favorable based on MRI DWI/FLAIR findings, noting nondiagnostic CT perfusion (MRI obtained due to nondiagnostic CT perfusion scan)  NIHSS components Score: Comment  1a Level of Conscious 0[x]  1[]  2[]  3[]      1b LOC Questions 0[]  1[x]  2[]       1c LOC Commands 0[x]  1[]  2[]       2 Best Gaze 0[]  1[x]  2[]       3 Visual 0[]  1[]  2[x]  3[]      4 Facial Palsy 0[]  1[x]  2[]  3[]      5a Motor Arm - left 0[x]  1[]  2[]  3[]  4[]  UN[]    5b Motor Arm - Right 0[]  1[]  2[x]  3[]  4[]  UN[]    6a Motor Leg - Left 0[x]  1[]  2[]  3[]  4[]  UN[]    6b Motor Leg - Right 0[x]  1[]  2[]  3[]  4[]  UN[]    7 Limb Ataxia 0[x]  1[]  2[]  UN[]      8 Sensory  0[x]  1[]  2[]  UN[]      9 Best Language 0[x]  1[]  2[]  3[]      10 Dysarthria 0[]  1[x]  2[]  UN[]      11 Extinct. and Inattention 0[]  1[x]  2[]       TOTAL: 9       ROS  Limited review of systems due to confusion, moderate dysarthria and need for Ativan  to facilitate imaging, obtained from family as able  Past History   Past Medical History:  Diagnosis Date   Aortic atherosclerosis (HCC)    Cervical spinal stenosis    Chronic kidney disease    COPD (chronic obstructive pulmonary disease) (HCC)    Coronary artery calcification seen on CT scan    ETOH abuse    a.) 28+ standard drinks/week   GERD (gastroesophageal reflux disease)    History of amiodarone  therapy    a.) in the past; self discontinued   History of medication noncompliance    a.) reported 10/2022 that he has been off NOAC for several months secondary to vertiginous symptoms, not acting like himself, and depression; b.) off/not taking as prescribed: antiarrhythmics, antihypertensives, statins, and MDIs   Hyperlipidemia    Hypertension    Long term current use of anticoagulant  a.) rivaroxaban    PAF (paroxysmal atrial fibrillation) (HCC)    a.) CHA2DS2VASc = 3 (age, HTN, vascular disease history);  b.) rate/rhythm maintained on oral carvediolol (self discontinued diltiazem  + amiodarone  + metoprolol ); chronically anticoagulated with rivaroxaban  (switched from apixaban  11/17/2022)   Tobacco abuse     Past Surgical History:  Procedure Laterality Date   SHOULDER ARTHROSCOPY WITH SUBACROMIAL DECOMPRESSION, ROTATOR CUFF REPAIR AND BICEP TENDON REPAIR Left 12/07/2022   Procedure: SHOULDER ARTHROSCOPY WITH DEBRIDEMENT, DECOMPRESSION, ROTATOR CUFF REPAIR AND BICEPS TENODESIS.- RNFA;  Surgeon: Elner Hahn, MD;  Location: [facility];  Service: Orthopedics;  Laterality: Left;   SHOULDER ARTHROSCOPY WITH SUBACROMIAL DECOMPRESSION, ROTATOR CUFF REPAIR AND BICEP TENDON REPAIR Left 02/02/2023   Procedure: LEFT SHOULDER ARTHROSCOPY WITH  DEBRIDEMENT AND REPAIR OF RECURRENT ROTATOR CUFF TEAR OF LEFT SHOULDER;  Surgeon: Elner Hahn, MD;  Location: [facility];  Service: Orthopedics;  Laterality: Left;   TONSILLECTOMY      Family History: Family History  Problem Relation Age of Onset   Emphysema Father    CAD Father        CABG   Stroke Mother    CAD Brother 22       CABG   Hypertension Brother     Social History  reports that he has been smoking cigarettes. He started smoking about 53 years ago. He has a 13.4 pack-year smoking history. He has quit using smokeless tobacco. He reports current alcohol use of about 28.0 standard drinks of alcohol per week. He reports current drug use. Drug: Marijuana.  No Known Allergies  Medications  No current facility-administered medications for this encounter.  Current Outpatient Medications:    ADVAIR  HFA 45-21 MCG/ACT inhaler, Inhale 2 puffs into the lungs as needed (wheezing)., Disp: 1 each, Rfl: 3   albuterol  (VENTOLIN  HFA) 108 (90 Base) MCG/ACT inhaler, INHALE 2 PUFFS BY MOUTH EVERY 4 HOURS AS NEEDED FOR WHEEZE OR FOR SHORTNESS OF BREATH, Disp: 18 each, Rfl: 5   amiodarone  (PACERONE ) 200 MG tablet, Take 200 mg by mouth daily., Disp: , Rfl:    atorvastatin  (LIPITOR) 40 MG tablet, Take 40 mg by mouth daily., Disp: , Rfl:    buPROPion  (WELLBUTRIN  SR) 150 MG 12 hr tablet, TAKE 1 TABLET (150 MG TOTAL) BY MOUTH 2 (TWO) TIMES DAILY. START ONE PILL ONCE PER DAY FOR 3 DAYS THEN INCREASE TO TWICE DAILY, Disp: 180 tablet, Rfl: 1   carvedilol  (COREG ) 3.125 MG tablet, Take 1 tablet (3.125 mg total) by mouth 2 (two) times daily., Disp: 180 tablet, Rfl: 3   diltiazem  (CARDIZEM  CD) 240 MG 24 hr capsule, Take 1 capsule (240 mg total) by mouth daily., Disp: 90 capsule, Rfl: 3   famotidine  (PEPCID ) 40 MG tablet, TAKE 1 TABLET EVERY DAY, Disp: 90 tablet, Rfl: 3   hydrOXYzine  (VISTARIL ) 25 MG capsule, TAKE 1 CAPSULE (25 MG TOTAL) BY MOUTH EVERY 6 (SIX) HOURS AS NEEDED., Disp: 360 capsule, Rfl: 1    lisinopril  (ZESTRIL ) 5 MG tablet, TAKE 1 TABLET EVERY DAY, Disp: 90 tablet, Rfl: 3   oxycodone  (OXY-IR) 5 MG capsule, Take 10 mg by mouth every 4 (four) hours as needed for pain., Disp: , Rfl:    pantoprazole  (PROTONIX ) 40 MG tablet, Take 1 tablet (40 mg total) by mouth daily as needed., Disp: 30 tablet, Rfl: 1   permethrin  (ELIMITE ) 5 % cream, Apply from neck down before bed then wash off in the morning. May repeat in one week., Disp: 60 g, Rfl: 1  TRELEGY ELLIPTA  100-62.5-25 MCG/ACT AEPB, INHALE 1 PUFF EVERY DAY, Disp: 180 each, Rfl: 0   triamcinolone  cream (KENALOG ) 0.1 %, Apply 1 Application topically 2 (two) times daily., Disp: 80 g, Rfl: 1  Vitals   Vitals:   Jul 04, 2024 2200 04-Jul-2024 2230 2024/07/04 2236  BP:  (!) 205/95   Pulse:  (!) 109   Resp:  (!) 22   SpO2:  99%   Weight: 68.8 kg  68.8 kg  Height:   6' (1.829 m)    Body mass index is 20.57 kg/m.   Physical Exam   Constitutional: Appears somewhat chronically ill Psych: Affect calm, cooperative, occasionally laughs inappropriately Eyes: No scleral injection HENT: No oropharyngeal obstruction.  MSK: no major joint deformities.  Cardiovascular: Perfusing extremities well Respiratory: Intermittent cough. GI: Soft.  No distension. There is no tenderness.   Neurologic Examination   Mental Status: Patient is awake, alert, states he is 68 years old instead of 9, oriented to the month of June.  Somewhat poor attention/concentration and exam also may be limited by patient being hard of hearing.  He is able to name and repeat but has moderate dysarthria.  May have some inattention of the right side Cranial Nerves: II: Visual Fields are notable for a right hemianopia. Left pupil slightly larger than the right but both are reactive  III,IV, VI: Left gaze preference but able to look fully towards the right V: Facial sensation is symmetric to eyelash brush VII: Facial movement is notable for slight right facial droop VIII: hearing  is intact to voice X: Uvula elevates symmetrically XI: Shoulder shrug is symmetric. XII: tongue is midline without atrophy or fasciculations.  Motor: Drift of the right upper extremity.  No drift of the bilateral lower extremities over the left upper extremity Sensory: Reports sensation is equal bilaterally in the face arm and legs Cerebellar: Finger-nose intact on the left, within limits of weakness on the right upper extremity Gait:  Deferred  Labs/Imaging/Neurodiagnostic studies   CBC:  Recent Labs  Lab 07/04/24 2212  WBC 7.5  NEUTROABS 5.9  HGB 17.0  17.0  HCT 47.6  50.0  MCV 104.2*  PLT 209   Basic Metabolic Panel:  Lab Results  Component Value Date   NA 138 07/04/2024   NA 138 07/04/24   K 4.2 July 04, 2024   K 3.7 04-Jul-2024   CO2 23 07-04-2024   GLUCOSE 93 July 04, 2024   GLUCOSE 98 Jul 04, 2024   BUN 21 July 04, 2024   BUN 16 July 04, 2024   CREATININE 1.50 (H) July 04, 2024   CREATININE 1.53 (H) 04-Jul-2024   CALCIUM  9.5 07/04/2024   GFRNONAA 49 (L) 07-04-2024   GFRAA 58 (L) 09/08/2020   Lipid Panel:  Lab Results  Component Value Date   LDLCALC 54 07/01/2022   HgbA1c:  Lab Results  Component Value Date   HGBA1C 5.0 06/14/2021   Urine Drug Screen:     Component Value Date/Time   LABOPIA NONE DETECTED 02/02/2023 1111   LABOPIA NONE DETECTED 06/16/2021 0257   COCAINSCRNUR NONE DETECTED 02/02/2023 1111   LABBENZ NONE DETECTED 02/02/2023 1111   LABBENZ POSITIVE (A) 06/16/2021 0257   AMPHETMU NONE DETECTED 02/02/2023 1111   AMPHETMU NONE DETECTED 06/16/2021 0257   THCU NONE DETECTED 02/02/2023 1111   THCU POSITIVE (A) 06/16/2021 0257   LABBARB NONE DETECTED 02/02/2023 1111   LABBARB NONE DETECTED 06/16/2021 0257    Alcohol Level     Component Value Date/Time   [facility] <15 07-04-2024 2212   INR  Lab  Results  Component Value Date   INR 1.0 06/06/2024   APTT  Lab Results  Component Value Date   APTT 28 06/06/2024   AED levels: No results found for:  PHENYTOIN, ZONISAMIDE, LAMOTRIGINE, LEVETIRACETA  CT Head without contrast(Personally reviewed): 1. No evidence of acute intracranial abnormality [initially however on review after CTA completed, there does seem to be some left insular hypodensity concerning for likely acute stroke with aspects of 9]. 2. Moderate presumed chronic microvascular ischemic disease.  CT angio Head and Neck with contrast(Personally reviewed): 1. Occluded proximal left M3 MCA. 2. Critical, nearly occlusive stenosis at the left carotid bifurcation. The degree of stenosis is difficult to quantify given the degree of bulky calcific atherosclerosis but likely at least 80%. 3. Approximately 55% stenosis of the right ICA. 4. Moderate right V2 vertebral artery stenosis due to compression from adjacent osteophyte.   MRI Brain(Personally reviewed): Multiple acute infarcts in the left MCA and left PCA territories, including the left basal ganglia, left posterior limb of the internal capsule, left anterior insula, overlying left frontal and parietal lobes and the left occipital lobe. No mass effect.  ASSESSMENT   Ronald Lowe is a 68 y.o. male with a past medical history significant for atrial fibrillation not on anticoagulation, hypertension, hyperlipidemia, alcohol abuse, ongoing smoking, cervical spine degenerative disc disease  He has acute stroke, potentially cardioembolic from A-fib, especially given multiple vascular territories involved however cannot rule out that there is also component of atheroembolic etiology from his left carotid disease  Unfortunately presented outside of the window for TNK and family describes episodic confusion but I am concerned may reflect prior TIAs from his left carotid stenosis.  However if stent was placed he would require triple therapy with dual antiplatelet therapy for stent plus anticoagulation for his atrial fibrillation and in the setting of ongoing heavy alcohol use  the patient would be very high risk of bleeding  Ultimately imaging findings were felt to not favor emergent intervention in discussion with neuro IR.  Does need admission for full stroke workup and further discussion about medication optimization as well as alcohol and tobacco cessation counseling  RECOMMENDATIONS   Emergent recommendations: - CTA head and neck to assess for LVO - CT perfusion scan, unfortunately resulted nondiagnostic  Case was discussed with Dr. Alvira Josephs, due to relatively small vessel but significant deficits and tandem stenosis in the left carotid, decision was made to proceed with MRI to guide decision making based on DWI/FLAIR changes  - MRI brain revealed scattered embolic appearing strokes most of which did have FLAIR changes  Additional recommendations:  # Multifocal strokes - Keep n.p.o., Dr. Alvira Josephs may consider diagnostic angio tomorrow for significant left carotid stenosis that may be symptomatic, however note that due to ongoing alcohol abuse and poor medication adherence, I have significant concern the patient would not be a good candidate for intervention such as stent placement - Stroke labs HgbA1c, fasting lipid panel - Frequent neuro checks per protocol - Echocardiogram - Prophylactic therapy-Antiplatelet med: Aspirin  - dose 325mg  PO or 300mg  PR daily for now, may be adjusted to dual antiplatelet therapy or substituted with anticoagulation pending further discussion with family and clinical course - Not alert enough to take p.o. medications per nursing at this time so holding off on Plavix - Risk factor modification - Telemetry monitoring; 30 day event monitor on discharge if no arrythmias captured  - Blood pressure goal   - Permissive hypertension to 220/120 due to acute stroke -  PT consult, OT consult, Speech consult, unless patient is back to baseline - Stroke team to follow  # Heavy alcohol use - B1, B12, folate - Empiric thiamine   supplementation - CIWAs, additional vitamin supplementation per primary team  ______________________________________________________________________   Baldwin Levee MD-PhD [facility] [phone]  CRITICAL CARE Performed by: Ronnette Coke   Total critical care time: 95 minutes  Critical care time was exclusive of separately billable procedures and treating other patients.  Critical care was necessary to treat or prevent imminent or life-threatening deterioration.  Critical care was time spent personally by me on the following activities: development of treatment plan with patient and/or surrogate as well as nursing, discussions with consultants, evaluation of patient's response to treatment, examination of patient, obtaining history from patient or surrogate, ordering and performing treatments and interventions, ordering and review of laboratory studies, ordering and review of radiographic studies, pulse oximetry and re-evaluation of patient's condition.

## 2024-06-07 NOTE — Evaluation (Signed)
 Clinical/Bedside Swallow Evaluation Patient Details  Name: Ronald Lowe MRN: [id number] Date of Birth: March 16, 1956  Today's Date: 06/07/2024 Time: SLP Start Time (ACUTE ONLY): 0930 SLP Stop Time (ACUTE ONLY): 0955 SLP Time Calculation (min) (ACUTE ONLY): 25 min  Past Medical History:  Past Medical History:  Diagnosis Date   Aortic atherosclerosis (HCC)    Cervical spinal stenosis    Chronic kidney disease    COPD (chronic obstructive pulmonary disease) (HCC)    Coronary artery calcification seen on CT scan    ETOH abuse    a.) 28+ standard drinks/week   GERD (gastroesophageal reflux disease)    History of amiodarone  therapy    a.) in the past; self discontinued   History of medication noncompliance    a.) reported 10/2022 that he has been off NOAC for several months secondary to vertiginous symptoms, not acting like himself, and depression; b.) off/not taking as prescribed: antiarrhythmics, antihypertensives, statins, and MDIs   Hyperlipidemia    Hypertension    Long term current use of anticoagulant    a.) rivaroxaban    PAF (paroxysmal atrial fibrillation) (HCC)    a.) CHA2DS2VASc = 3 (age, HTN, vascular disease history);  b.) rate/rhythm maintained on oral carvediolol (self discontinued diltiazem  + amiodarone  + metoprolol ); chronically anticoagulated with rivaroxaban  (switched from apixaban  11/17/2022)   Tobacco abuse    Past Surgical History:  Past Surgical History:  Procedure Laterality Date   SHOULDER ARTHROSCOPY WITH SUBACROMIAL DECOMPRESSION, ROTATOR CUFF REPAIR AND BICEP TENDON REPAIR Left 12/07/2022   Procedure: SHOULDER ARTHROSCOPY WITH DEBRIDEMENT, DECOMPRESSION, ROTATOR CUFF REPAIR AND BICEPS TENODESIS.- RNFA;  Surgeon: Elner Hahn, MD;  Location: [facility];  Service: Orthopedics;  Laterality: Left;   SHOULDER ARTHROSCOPY WITH SUBACROMIAL DECOMPRESSION, ROTATOR CUFF REPAIR AND BICEP TENDON REPAIR Left 02/02/2023   Procedure: LEFT SHOULDER ARTHROSCOPY WITH DEBRIDEMENT  AND REPAIR OF RECURRENT ROTATOR CUFF TEAR OF LEFT SHOULDER;  Surgeon: Elner Hahn, MD;  Location: [facility];  Service: Orthopedics;  Laterality: Left;   TONSILLECTOMY     HPI:  Ronald Lowe is a 68 yo male presenting to ED 6/19 with R sided weakness and dysarthria. MRI shows multiple acute infarcts in the L MCA and L PCA territories, including the L basal ganglia, L posterior limb of the internal capsule, L anterior insula, overlying L frontal and parietal lobes, and L occipital lobe. Failed yale due to lethargy. PMH includes daily ETOH use, HTN, A-fib not on anticoagulation, ongoing tobacco use, hepatitis C    Assessment / Plan / Recommendation  Clinical Impression  Pt is restless and has difficulty attending to functional tasks. Oral motor exam significant for mild R sided facial asymmetry and edentulism. When attentive to POs, his swallow appears overall functional. No signs clinically concerning for aspiration were observed with consecutive sips of thin liquids (pt was unable to follow directions to complete 3 oz water test). He intermittently held purees and solids orally but initiated a swallow response with cueing. He achieves complete oral clearance despite prolonged oral transit. Suspect pt's mentation may continue to fluctuate given EtOH withdrawal but feel a PO diet can be initiated if given full supervision. Recommend Dys 2 solids with thin liquids and meds as tolerated based on level of arousal. Hold POs when pt is not awake/alert. Discussed with RN and MD, will f/u to assess readiness to advance diet. SLP Visit Diagnosis: Dysphagia, unspecified (R13.10)    Aspiration Risk  Mild aspiration risk    Diet Recommendation Dysphagia 2 (Fine chop);Thin  liquid    Liquid Administration via: Cup;Straw Medication Administration: Whole meds with liquid Supervision: Staff to assist with self feeding;Full supervision/cueing for compensatory strategies Compensations: Minimize environmental  distractions;Slow rate;Small sips/bites Postural Changes: Seated upright at 90 degrees    Other  Recommendations Oral Care Recommendations: Oral care BID;Staff/trained caregiver to provide oral care     Assistance Recommended at Discharge    Functional Status Assessment Patient has had a recent decline in their functional status and demonstrates the ability to make significant improvements in function in a reasonable and predictable amount of time.  Frequency and Duration min 2x/week  2 weeks       Prognosis Prognosis for improved oropharyngeal function: Good Barriers to Reach Goals: Cognitive deficits      Swallow Study   General HPI: Ronald Lowe is a 68 yo male presenting to ED 6/19 with R sided weakness and dysarthria. MRI shows multiple acute infarcts in the L MCA and L PCA territories, including the L basal ganglia, L posterior limb of the internal capsule, L anterior insula, overlying L frontal and parietal lobes, and L occipital lobe. Failed yale due to lethargy. PMH includes daily ETOH use, HTN, A-fib not on anticoagulation, ongoing tobacco use, hepatitis C Type of Study: Bedside Swallow Evaluation Previous Swallow Assessment: none in chart Diet Prior to this Study: NPO Temperature Spikes Noted: No Respiratory Status: Room air History of Recent Intubation: No Behavior/Cognition: Alert;Cooperative;Distractible;Requires cueing Oral Cavity Assessment: Within Functional Limits Oral Care Completed by SLP: No Oral Cavity - Dentition: Edentulous Vision: Functional for self-feeding Self-Feeding Abilities: Total assist Patient Positioning: Upright in bed Baseline Vocal Quality: Normal Volitional Cough: Cognitively unable to elicit Volitional Swallow: Unable to elicit    Oral/Motor/Sensory Function Overall Oral Motor/Sensory Function: Mild impairment Facial ROM: Reduced right;Suspected CN VII (facial) dysfunction Facial Symmetry: Abnormal symmetry right;Suspected CN VII  (facial) dysfunction Facial Strength: Reduced right;Suspected CN VII (facial) dysfunction Facial Sensation: Within Functional Limits Lingual ROM: Within Functional Limits Lingual Symmetry: Within Functional Limits Lingual Strength: Within Functional Limits Lingual Sensation: Within Functional Limits   Ice Chips Ice chips: Not tested   Thin Liquid Thin Liquid: Within functional limits Presentation: Straw    Nectar Thick Nectar Thick Liquid: Not tested   Honey Thick Honey Thick Liquid: Not tested   Puree Puree: Impaired Presentation: Spoon Oral Phase Impairments: Poor awareness of bolus Oral Phase Functional Implications: Oral holding   Solid     Solid: Impaired Oral Phase Impairments: Poor awareness of bolus Oral Phase Functional Implications: Oral holding      Amil Kale, M.A., CCC-SLP Speech Language Pathology, Acute Rehabilitation Services  Secure Chat preferred [phone]  06/07/2024,10:30 AM

## 2024-06-07 NOTE — H&P (Addendum)
 History and Physical    Ronald Lowe ZOX:[id number] DOB: 1956/06/13 DOA: 06/06/2024  DOS: the patient was seen and examined on 06/06/2024  PCP: Abraham Abo, MD   Patient coming from: Home  I have personally briefly reviewed patient's old medical records in [facility]  HPI:  Ronald Lowe is a 68 y.o. year old male with past medical history of hypertension, hyperlipidemia, A-fib noncompliant with anticoagulation, GERD, tobacco use disorder, CKD, COPD, EtOH use disorder.  Ronald Lowe presents to [name] ED after family found him sitting on the floor confused with right arm weakness.  LKN was 06/06/24 at 7:30 AM when he left for work.  Family notes that he has been having frequent episodes of confusion and they have been concerned about dementia versus the confusion being related to his heavy drinking. History primarily obtained from review of chart as Ronald Lowe is not able to fully participate in interview, he can answer some basic questions appropriately at time of my interview.  ED Course: On arrival to [facility] ED patient was noted to be afebrile temp 37.6 C, BP 205/95, HR 109, RR 22, SpO2 99% on room air.  Labs notable for creatinine of 1.53 and elevated T. bili at 2.0 was normal AST, ALT, alk phos.  CT head obtained and shows hypoattenuation in the left anterior insular region concerning for acute infarct and chronic microvascular ischemic disease.  MRI obtained for better characterization of CT head findings.  MRI showed occluded proximal left M3 MCA, nearly occlusive stenosis of the left carotid bifurcation, 55% stenosis of the right ICA, and right V2 vertebral stenosis secondary to compression from adjacent osteophyte.  CXR obtained and negative for acute abnormalities.  He was given Ativan  in the ED.  Neurology consulted as part of code stroke and [facility] later contacted for admission.   Review of Systems: As mentioned in the history of present illness. All other systems  reviewed and are negative.  Past Medical History:  Diagnosis Date   Aortic atherosclerosis (HCC)    Cervical spinal stenosis    Chronic kidney disease    COPD (chronic obstructive pulmonary disease) (HCC)    Coronary artery calcification seen on CT scan    ETOH abuse    a.) 28+ standard drinks/week   GERD (gastroesophageal reflux disease)    History of amiodarone  therapy    a.) in the past; self discontinued   History of medication noncompliance    a.) reported 10/2022 that he has been off NOAC for several months secondary to vertiginous symptoms, not acting like himself, and depression; b.) off/not taking as prescribed: antiarrhythmics, antihypertensives, statins, and MDIs   Hyperlipidemia    Hypertension    Long term current use of anticoagulant    a.) rivaroxaban    PAF (paroxysmal atrial fibrillation) (HCC)    a.) CHA2DS2VASc = 3 (age, HTN, vascular disease history);  b.) rate/rhythm maintained on oral carvediolol (self discontinued diltiazem  + amiodarone  + metoprolol ); chronically anticoagulated with rivaroxaban  (switched from apixaban  11/17/2022)   Tobacco abuse     Past Surgical History:  Procedure Laterality Date   SHOULDER ARTHROSCOPY WITH SUBACROMIAL DECOMPRESSION, ROTATOR CUFF REPAIR AND BICEP TENDON REPAIR Left 12/07/2022   Procedure: SHOULDER ARTHROSCOPY WITH DEBRIDEMENT, DECOMPRESSION, ROTATOR CUFF REPAIR AND BICEPS TENODESIS.- RNFA;  Surgeon: Elner Hahn, MD;  Location: [facility] ORS;  Service: Orthopedics;  Laterality: Left;   SHOULDER ARTHROSCOPY WITH SUBACROMIAL DECOMPRESSION, ROTATOR CUFF REPAIR AND BICEP TENDON REPAIR Left 02/02/2023   Procedure: LEFT  SHOULDER ARTHROSCOPY WITH DEBRIDEMENT AND REPAIR OF RECURRENT ROTATOR CUFF TEAR OF LEFT SHOULDER;  Surgeon: Elner Hahn, MD;  Location: [facility] ORS;  Service: Orthopedics;  Laterality: Left;   TONSILLECTOMY       reports that he has been smoking cigarettes. He started smoking about 53 years ago. He has a 13.4 pack-year  smoking history. He has quit using smokeless tobacco. He reports current alcohol use of about 28.0 standard drinks of alcohol per week. He reports current drug use. Drug: Marijuana.  No Known Allergies  Family History  Problem Relation Age of Onset   Emphysema Father    CAD Father        CABG   Stroke Mother    CAD Brother 79       CABG   Hypertension Brother     Prior to Admission medications   Medication Sig Start Date End Date Taking? Authorizing Provider  ADVAIR  HFA 45-21 MCG/ACT inhaler Inhale 2 puffs into the lungs as needed (wheezing). 05/12/23   Abraham Abo, MD  albuterol  (VENTOLIN  HFA) 108 [phone] Base) MCG/ACT inhaler INHALE 2 PUFFS BY MOUTH EVERY 4 HOURS AS NEEDED FOR WHEEZE OR FOR SHORTNESS OF BREATH 11/03/23   Icard, Dariel Edelson L, DO  amiodarone  (PACERONE ) 200 MG tablet Take 200 mg by mouth daily. 02/04/23   [provider]  atorvastatin  (LIPITOR) 40 MG tablet Take 40 mg by mouth daily. 12/19/22   [provider]  buPROPion  (WELLBUTRIN  SR) 150 MG 12 hr tablet TAKE 1 TABLET (150 MG TOTAL) BY MOUTH 2 (TWO) TIMES DAILY. START ONE PILL ONCE PER DAY FOR 3 DAYS THEN INCREASE TO TWICE DAILY 08/25/23 02/21/24  Icard, Dariel Edelson L, DO  carvedilol  (COREG ) 3.125 MG tablet Take 1 tablet (3.125 mg total) by mouth 2 (two) times daily. 11/17/22   Von Grumbling, PA-C  diltiazem  (CARDIZEM  CD) 240 MG 24 hr capsule Take 1 capsule (240 mg total) by mouth daily. 04/13/23   Loyde Rule, MD  famotidine  (PEPCID ) 40 MG tablet TAKE 1 TABLET EVERY DAY 05/29/24   McClung, Angela M, PA-C  hydrOXYzine  (VISTARIL ) 25 MG capsule TAKE 1 CAPSULE (25 MG TOTAL) BY MOUTH EVERY 6 (SIX) HOURS AS NEEDED. 02/29/24   Abraham Abo, MD  lisinopril  (ZESTRIL ) 5 MG tablet TAKE 1 TABLET EVERY DAY 01/11/24   Abraham Abo, MD  oxycodone  (OXY-IR) 5 MG capsule Take 10 mg by mouth every 4 (four) hours as needed for pain.    [provider]  pantoprazole  (PROTONIX ) 40 MG tablet Take 1 tablet (40 mg total) by  mouth daily as needed. 12/06/23   Abraham Abo, MD  permethrin  (ELIMITE ) 5 % cream Apply from neck down before bed then wash off in the morning. May repeat in one week. 12/12/23   Afton Albright, MD  TRELEGY ELLIPTA  100-62.5-25 MCG/ACT AEPB INHALE 1 PUFF EVERY DAY 04/22/24   Abraham Abo, MD  triamcinolone  cream (KENALOG ) 0.1 % Apply 1 Application topically 2 (two) times daily. 02/22/24   Abraham Abo, MD    Physical Exam: Vitals:   06/06/24 2236 06/07/24 0044 06/07/24 0315 06/07/24 0450  BP:   (!) 211/118   Pulse:   96   Resp:   20   Temp:  99.6 F (37.6 C)  99.1 F (37.3 C)  TempSrc:  Oral  Axillary  SpO2:   96%   Weight: 68.8 kg     Height: 6' (1.829 m)       Constitutional: Older Caucasian gentleman  Eyes: Pupils  round and reactive to light (R) 3mm; (L) 4mm Respiratory: clear to auscultation bilaterally, no wheezing, no crackles. Normal respiratory effort. No accessory muscle use. Cough noted Cardiovascular: Regular rate and rhythm, no murmurs / rubs / gallops. No extremity edema. 2+ radial and pedal pulses.   Abdomen: Soft, nontender.  Bowel sounds positive x 4 quadrants.  Musculoskeletal: Patient grimaces with passive flexion/extension at (R) elbow Skin: Warm, dry.  No rashes, lesions, ulcers  Neurologic: Alert and oriented to person and place (tells me he is at [employer] initially but knows he is in the hospital), follow commands. Dysarthric. RUE strength 3/5 with drift.  LUE strength 5/5.  Full movement of bilateral lower extremities with equal strength bilaterally, no weakness. Sensation intact.  Labs on Admission: I have personally reviewed following labs and imaging studies  CBC: Recent Labs  Lab 06/06/24 2212  WBC 7.5  NEUTROABS 5.9  HGB 17.0  17.0  HCT 47.6  50.0  MCV 104.2*  PLT 209   Basic Metabolic Panel: Recent Labs  Lab 06/06/24 2212  NA 138  138  K 3.7  4.2  CL 103  105  CO2 23  GLUCOSE 98  93  BUN 16  21  CREATININE 1.53*  1.50*   CALCIUM  9.5   GFR: Estimated Creatinine Clearance: 45.2 mL/min (A) (by C-G formula based on SCr of 1.5 mg/dL (H)). Liver Function Tests: Recent Labs  Lab 06/06/24 2212  AST 37  ALT 22  ALKPHOS 58  BILITOT 2.0*  PROT 7.1  ALBUMIN 4.3   No results for input(s): LIPASE, AMYLASE in the last 168 hours. No results for input(s): AMMONIA in the last 168 hours. Coagulation Profile: Recent Labs  Lab 06/06/24 2212  INR 1.0   Cardiac Enzymes: No results for input(s): CKTOTAL, CKMB, CKMBINDEX, TROPONINI, TROPONINIHS in the last 168 hours. BNP (last 3 results) No results for input(s): BNP in the last 8760 hours. HbA1C: No results for input(s): HGBA1C in the last 72 hours. CBG: No results for input(s): GLUCAP in the last 168 hours. Lipid Profile: No results for input(s): CHOL, HDL, LDLCALC, TRIG, CHOLHDL, LDLDIRECT in the last 72 hours. Thyroid  Function Tests: No results for input(s): TSH, T4TOTAL, FREET4, T3FREE, THYROIDAB in the last 72 hours. Anemia Panel: No results for input(s): VITAMINB12, FOLATE, FERRITIN, TIBC, IRON, RETICCTPCT in the last 72 hours. Urine analysis:    Component Value Date/Time   COLORURINE YELLOW 04/07/2021 1820   APPEARANCEUR HAZY (A) 04/07/2021 1820   LABSPEC 1.005 04/07/2021 1820   PHURINE 7.0 04/07/2021 1820   GLUCOSEU NEGATIVE 04/07/2021 1820   HGBUR NEGATIVE 04/07/2021 1820   BILIRUBINUR NEGATIVE 04/07/2021 1820   KETONESUR NEGATIVE 04/07/2021 1820   PROTEINUR NEGATIVE 04/07/2021 1820   UROBILINOGEN 0.2 06/12/2014 1943   NITRITE NEGATIVE 04/07/2021 1820   LEUKOCYTESUR NEGATIVE 04/07/2021 1820    Radiological Exams on Admission: I have personally reviewed images DG Chest Portable 1 View Result Date: 06/07/2024 EXAM: 1 VIEW XRAY OF THE CHEST 06/07/2024 01:01:32 AM COMPARISON: CT chest dated 01/29/2024. CLINICAL HISTORY: Coughing. FINDINGS: LUNGS AND PLEURA: No focal pulmonary opacity. No  pulmonary edema. No pleural effusion. No pneumothorax. HEART AND MEDIASTINUM: No acute abnormality of the cardiac and mediastinal silhouettes. Thoracic aortic atherosclerosis. BONES AND SOFT TISSUES: No acute osseous abnormality. IMPRESSION: 1. No acute findings. Electronically signed by: Zadie Herter MD 06/07/2024 01:04 AM EDT RP Workstation: [id number]   MR BRAIN WO CONTRAST Result Date: 06/06/2024 CLINICAL DATA:  Neuro deficit, acute, stroke suspected EXAM: MRI HEAD  WITHOUT CONTRAST TECHNIQUE: Multiplanar, multiecho pulse sequences of the brain and surrounding structures were obtained without intravenous contrast. COMPARISON:  Same day CTA head/neck and CT head. FINDINGS: Brain: Multiple acute infarcts in the left MCA and left PCA territories, including the left basal ganglia, left posterior limb of the internal capsule, left anterior insula, overlying left frontal and parietal lobes and the left occipital lobe. Mild associated edema in the anterior insula. No mass effect. No midline shift. Additional moderate patchy T2/FLAIR hyperintensities, compatible with chronic microvascular ischemic disease. No mass lesion or hydrocephalus. No extra-axial fluid collections. Vascular: Better evaluated on same day CTA. Skull and upper cervical spine: Normal marrow signal. Sinuses/Orbits: Clear sinuses.  No acute orbital findings. Other: No mastoid effusions. IMPRESSION: Multiple acute infarcts in the left MCA and left PCA territories, including the left basal ganglia, left posterior limb of the internal capsule, left anterior insula, overlying left frontal and parietal lobes and the left occipital lobe. No mass effect. Electronically Signed   By: Stevenson Elbe M.D.   On: 06/06/2024 23:29   CT ANGIO HEAD NECK W WO CM W PERF (CODE STROKE) Result Date: 06/06/2024 CLINICAL DATA:  Neuro deficit, acute, stroke suspected EXAM: CT ANGIOGRAPHY HEAD AND NECK CT PERFUSION BRAIN TECHNIQUE: Multidetector CT imaging of the  head and neck was performed using the standard protocol during bolus administration of intravenous contrast. Multiplanar CT image reconstructions and MIPs were obtained to evaluate the vascular anatomy. Carotid stenosis measurements (when applicable) are obtained utilizing NASCET criteria, using the distal internal carotid diameter as the denominator. Multiphase CT imaging of the brain was performed following IV bolus contrast injection. Subsequent parametric perfusion maps were calculated using RAPID software. RADIATION DOSE REDUCTION: This exam was performed according to the departmental dose-optimization program which includes automated exposure control, adjustment of the mA and/or kV according to patient size and/or use of iterative reconstruction technique. CONTRAST:  OMNIPAQUE  IOHEXOL  350 MG/ML SOLN COMPARISON:  Same day CT head. FINDINGS: CTA NECK FINDINGS Aortic arch: Aortic atherosclerosis. Great vessel origins are patent. Right carotid system: Atherosclerosis at the carotid bifurcation and involving the proximal ICA with approximately 55% stenosis. Left carotid system: Critical, nearly occlusive stenosis at the left carotid bifurcation. The degree of stenosis is difficult to quantify given the degree of bulky calcific atherosclerosis but likely at least 80%. More distal ICA is patent but diminutive. Vertebral arteries: Right dominant. Mild bilateral vertebral artery origin stenosis. Moderate right V2 vertebral artery stenosis due to compression from adjacent osteophyte. Skeleton: Severe multilevel degenerative change in the cervical spine. No acute abnormality on limited assessment. Other neck: No acute abnormality on limited assessment. Upper chest: Emphysema.  Visualized lung apices are clear. Review of the MIP images confirms the above findings CTA HEAD FINDINGS Anterior circulation: Bilateral intracranial ICAs are patent without hemodynamically significant stenosis. Bilateral M1 MCAs and the ACAs  are patent. Hypoplastic left A1 ACA. Occluded proximal left M3 MCA just above the anterior sylvian fissure (for example see series 8, images 112 through 114). Posterior circulation: Bilateral intradural vertebral arteries, basilar artery and bilateral posterior cerebral arteries are patent without proximal hemodynamically significant stenosis. Venous sinuses: As permitted by contrast timing, patent. Review of the MIP images confirms the above findings CT Brain Perfusion Findings: ASPECTS: 9 CBF (<30%) Volume: 0mL Perfusion (Tmax>6.0s) volume: Mismatch Volume: spanning multiple vascular territories bilaterally both infratentorial and supratentorial and therefore likely largely artifactual. Infarction Location:No core infarct identified. IMPRESSION: CTA: 1. Occluded proximal left M3 MCA. 2. Critical,  nearly occlusive stenosis at the left carotid bifurcation. The degree of stenosis is difficult to quantify given the degree of bulky calcific atherosclerosis but likely at least 80%. 3. Approximately 55% stenosis of the right ICA. 4. Moderate right V2 vertebral artery stenosis due to compression from adjacent osteophyte. CTP: Nondiagnostic perfusion. Findings discussed with Dr. Cleone Dad via telephone at 10:30 p.m. Electronically Signed   By: Stevenson Elbe M.D.   On: 06/06/2024 22:48   CT HEAD CODE STROKE WO CONTRAST Result Date: 06/06/2024 CLINICAL DATA:  Code stroke.  Neuro deficit, acute, stroke suspected EXAM: CT HEAD WITHOUT CONTRAST TECHNIQUE: Contiguous axial images were obtained from the base of the skull through the vertex without intravenous contrast. RADIATION DOSE REDUCTION: This exam was performed according to the departmental dose-optimization program which includes automated exposure control, adjustment of the mA and/or kV according to patient size and/or use of iterative reconstruction technique. COMPARISON:  None Available. FINDINGS: Brain: No evidence of acute infarction, hemorrhage,  hydrocephalus, extra-axial collection or mass lesion/mass effect. Moderate patchy white matter hypodensities, compatible with chronic microvascular ischemic disease. Vascular: No hyperdense vessel identified. Skull: Normal. Negative for fracture or focal lesion. Sinuses/Orbits: Clear sinuses.  No acute orbital findings. Other: No mastoid effusions. ASPECTS [facility] Stroke Program Early CT Score) Total score (0-10 with 10 being normal): 9. IMPRESSION: 1. Subtle hypoattenuation in the anterior left insula, concerning for acute infarct. ASPECTS 9. 2. Moderate chronic microvascular ischemic disease. Findings discussed with Dr. Cleone Dad via telephone at 10:30 p.m. Electronically Signed   By: Stevenson Elbe M.D.   On: 06/06/2024 22:37    EKG: My personal interpretation of EKG shows: Sinus tachycardia, HR 101    Assessment/Plan Principal Problem:   Acute CVA (cerebrovascular accident) (HCC)   #Acute CVA  - ASA 325 mg daily per neuro - ECHO - Permissive HTN B/P goal <220/120. PRN labetalol if BP above these parameters. -Check A1C  - Lipid panel, resume statin when able to take p.o. - q4H neuro checks - STAT head CT for any change in neuro exam - Tele - PT/OT/SLP - Stroke education - Neurology consulted appreciate their recommendations and management.  Patient being kept n.p.o. at this time for possible diagnostic angio.  # Acute kidney injury on chronic kidney disease Unclear baseline creatinine. Notes from 2022 and 2023 noted baseline creatinine of 1.9 however most recent readings from 2022 and 2023 in [facility] show creatinine of 0.94-1.2. - IVF hydration - LR at 100 mL/hr - Avoid nephrotoxins, Contrast Dyes, Hypotension and Dehydration  - Renally Adjust Meds - Continue to Monitor and Trend Renal Function carefully and repeat BMP with a.m. labs  #(R) forearm pain Patient grimaces with flexion/extension at (R) elbow and palpation of forearm - X-ray of forearm - As needed  analgesia  #Hypertension - Allow permissive hypertension with goal BP <220/120 - As needed labetalol if BP above parameters  # Paroxysmal Atrial Fibrillation Noncompliant with outpatient anticoagulation and Coreg   #Hyperlipidemia Noncompliant with outpatient statin - Restart when able to take p.o. meds  #COPD - Continue home bronchodilators - BR nebulizer while inpatient  #GERD - Protonix   # EtOH use disorder - CIWA protocol with as needed Ativan  - Thiamine , folate, multivitamin  #Tobacco Use Disorder - NRT with nicotine  patch while inpatient  VTE prophylaxis:  Lovenox   GI prophylaxis: Protonix  Diet: N.p.o. Access: PIV Lines: None Code Status: Full Telemetry: Yes Disposition: Observe on telemetry  Admission status: Observation, Telemetry bed Patient is from: Home Anticipated d/c is to: Rehab versus  SNF Anticipated d/c date is: Tomorrow Patient currently: Pending stroke workup and ongoing neurology consultation  Family Communication: Called spouse Valinda Gault at [phone], number not in service.  Neighbor Angie called at [phone], requested she have Valinda Gault give us  a call back.   Consults called: She consulted by EDP   Severity of Illness: The appropriate patient status for this patient is OBSERVATION. Observation status is judged to be reasonable and necessary in order to provide the required intensity of service to ensure the patient's safety. The patient's presenting symptoms, physical exam findings, and initial radiographic and laboratory data in the context of their medical condition is felt to place them at decreased risk for further clinical deterioration. Furthermore, it is anticipated that the patient will be medically stable for discharge from the hospital within 2 midnights of admission.   To reach the provider On-Call:   7AM- 7PM see care teams to locate the attending and reach out to them via [email or web address] Password: [email or web address] 7PM-7AM contact night-coverage If  you still have difficulty reaching the appropriate provider, please page the [facility] (Director on Call) for [facility] on amion for assistance  This document was prepared using Conservation officer, historic buildings and may include unintentional dictation errors.  Naida Austria FNP-BC, PMHNP-BC Nurse Practitioner [facility]

## 2024-06-07 NOTE — Discharge Instructions (Signed)

## 2024-06-07 NOTE — Plan of Care (Signed)
 Problem: Education: Goal: Knowledge of disease or condition will improve 06/07/2024 2242 by Media Spikes, RN Outcome: Progressing 06/07/2024 1937 by Media Spikes, RN Outcome: Progressing Goal: Knowledge of secondary prevention will improve (MUST DOCUMENT ALL) 06/07/2024 2242 by Media Spikes, RN Outcome: Progressing 06/07/2024 1937 by Media Spikes, RN Outcome: Progressing Goal: Knowledge of patient specific risk factors will improve (DELETE if not current risk factor) 06/07/2024 2242 by Media Spikes, RN Outcome: Progressing 06/07/2024 1937 by Media Spikes, RN Outcome: Progressing   Problem: Ischemic Stroke/TIA Tissue Perfusion: Goal: Complications of ischemic stroke/TIA will be minimized 06/07/2024 2242 by Media Spikes, RN Outcome: Progressing 06/07/2024 1937 by Media Spikes, RN Outcome: Progressing   Problem: Coping: Goal: Will verbalize positive feelings about self 06/07/2024 2242 by Media Spikes, RN Outcome: Progressing 06/07/2024 1937 by Media Spikes, RN Outcome: Progressing Goal: Will identify appropriate support needs 06/07/2024 2242 by Media Spikes, RN Outcome: Progressing 06/07/2024 1937 by Media Spikes, RN Outcome: Progressing   Problem: Health Behavior/Discharge Planning: Goal: Ability to manage health-related needs will improve 06/07/2024 2242 by Media Spikes, RN Outcome: Progressing 06/07/2024 1937 by Media Spikes, RN Outcome: Progressing Goal: Goals will be collaboratively established with patient/family 06/07/2024 2242 by Media Spikes, RN Outcome: Progressing 06/07/2024 1937 by Media Spikes, RN Outcome: Progressing   Problem: Self-Care: Goal: Ability to participate in self-care as condition permits will improve 06/07/2024 2242 by Media Spikes, RN Outcome: Progressing 06/07/2024 1937 by Media Spikes, RN Outcome: Progressing Goal: Verbalization of feelings and concerns over difficulty with  self-care will improve 06/07/2024 2242 by Media Spikes, RN Outcome: Progressing 06/07/2024 1937 by Media Spikes, RN Outcome: Progressing Goal: Ability to communicate needs accurately will improve 06/07/2024 2242 by Media Spikes, RN Outcome: Progressing 06/07/2024 1937 by Media Spikes, RN Outcome: Progressing   Problem: Nutrition: Goal: Risk of aspiration will decrease 06/07/2024 2242 by Media Spikes, RN Outcome: Progressing 06/07/2024 1937 by Media Spikes, RN Outcome: Progressing Goal: Dietary intake will improve 06/07/2024 2242 by Media Spikes, RN Outcome: Progressing 06/07/2024 1937 by Media Spikes, RN Outcome: Progressing   Problem: Education: Goal: Knowledge of General Education information will improve Description: Including pain rating scale, medication(s)/side effects and non-pharmacologic comfort measures 06/07/2024 2242 by Media Spikes, RN Outcome: Progressing 06/07/2024 1937 by Media Spikes, RN Outcome: Progressing   Problem: Health Behavior/Discharge Planning: Goal: Ability to manage health-related needs will improve 06/07/2024 2242 by Media Spikes, RN Outcome: Progressing 06/07/2024 1937 by Media Spikes, RN Outcome: Progressing   Problem: Clinical Measurements: Goal: Ability to maintain clinical measurements within normal limits will improve 06/07/2024 2242 by Media Spikes, RN Outcome: Progressing 06/07/2024 1937 by Media Spikes, RN Outcome: Progressing Goal: Will remain free from infection 06/07/2024 2242 by Media Spikes, RN Outcome: Progressing 06/07/2024 1937 by Media Spikes, RN Outcome: Progressing Goal: Diagnostic test results will improve 06/07/2024 2242 by Media Spikes, RN Outcome: Progressing 06/07/2024 1937 by Media Spikes, RN Outcome: Progressing Goal: Respiratory complications will improve 06/07/2024 2242 by Media Spikes, RN Outcome: Progressing 06/07/2024 1937 by Media Spikes,  RN Outcome: Progressing Goal: Cardiovascular complication will be avoided 06/07/2024 2242 by Media Spikes, RN Outcome: Progressing 06/07/2024 1937 by Media Spikes, RN Outcome: Progressing   Problem: Activity: Goal: Risk for activity intolerance will decrease 06/07/2024 2242 by Media Spikes, RN Outcome: Progressing 06/07/2024 1937 by  Media Spikes, RN Outcome: Progressing   Problem: Nutrition: Goal: Adequate nutrition will be maintained 06/07/2024 2242 by Media Spikes, RN Outcome: Progressing 06/07/2024 1937 by Media Spikes, RN Outcome: Progressing   Problem: Coping: Goal: Level of anxiety will decrease 06/07/2024 2242 by Media Spikes, RN Outcome: Progressing 06/07/2024 1937 by Media Spikes, RN Outcome: Progressing   Problem: Elimination: Goal: Will not experience complications related to bowel motility 06/07/2024 2242 by Media Spikes, RN Outcome: Progressing 06/07/2024 1937 by Media Spikes, RN Outcome: Progressing Goal: Will not experience complications related to urinary retention 06/07/2024 2242 by Media Spikes, RN Outcome: Progressing 06/07/2024 1937 by Media Spikes, RN Outcome: Progressing   Problem: Pain Managment: Goal: General experience of comfort will improve and/or be controlled 06/07/2024 2242 by Media Spikes, RN Outcome: Progressing 06/07/2024 1937 by Media Spikes, RN Outcome: Progressing   Problem: Safety: Goal: Ability to remain free from injury will improve 06/07/2024 2242 by Media Spikes, RN Outcome: Progressing 06/07/2024 1937 by Media Spikes, RN Outcome: Progressing   Problem: Skin Integrity: Goal: Risk for impaired skin integrity will decrease 06/07/2024 2242 by Media Spikes, RN Outcome: Progressing 06/07/2024 1937 by Media Spikes, RN Outcome: Progressing

## 2024-06-07 NOTE — ED Notes (Signed)
PT assisted to bathroom.

## 2024-06-07 NOTE — Evaluation (Signed)
 Speech Language Pathology Evaluation Patient Details Name: Ronald Lowe MRN: [id number] DOB: 10-18-1956 Today's Date: 06/07/2024 Time: 3086-5784 SLP Time Calculation (min) (ACUTE ONLY): 25 min  Problem List:  Patient Active Problem List   Diagnosis Date Noted   Hyperlipidemia 06/07/2024   Right arm pain 06/07/2024   Acute CVA (cerebrovascular accident) (HCC) 06/06/2024   Status post left rotator cuff repair 02/02/2023   Foraminal stenosis of cervical region 03/23/2022   Left arm weakness 03/23/2022   Paroxysmal atrial fibrillation (HCC) 12/22/2021   Secondary hypercoagulable state (HCC) 12/22/2021   Medication management 10/20/2021   Chronic obstructive pulmonary disease (HCC) 10/19/2021   Alcohol withdrawal delirium (HCC) 06/17/2021   Atrial fibrillation with rapid ventricular response (HCC) 06/17/2021   Macrocytic anemia 06/17/2021   COPD exacerbation (HCC) 06/14/2021   Multifocal pneumonia    CAP (community acquired pneumonia) 06/13/2021   Emphysema lung (HCC) 03/17/2021   Positive hepatitis C antibody test 10/02/2019   Tobacco dependence 09/24/2019   Alcohol use disorder 09/24/2019   Acute kidney injury (HCC) 06/12/2014   Orthostatic hypotension 06/12/2014   EKG abnormality 06/12/2014   Elevated blood pressure reading in office with diagnosis of hypertension 06/12/2014   HTN (hypertension) 04/12/2013   Past Medical History:  Past Medical History:  Diagnosis Date   Aortic atherosclerosis (HCC)    Cervical spinal stenosis    Chronic kidney disease    COPD (chronic obstructive pulmonary disease) (HCC)    Coronary artery calcification seen on CT scan    ETOH abuse    a.) 28+ standard drinks/week   GERD (gastroesophageal reflux disease)    History of amiodarone  therapy    a.) in the past; self discontinued   History of medication noncompliance    a.) reported 10/2022 that he has been off NOAC for several months secondary to vertiginous symptoms, not acting like  himself, and depression; b.) off/not taking as prescribed: antiarrhythmics, antihypertensives, statins, and MDIs   Hyperlipidemia    Hypertension    Long term current use of anticoagulant    a.) rivaroxaban    PAF (paroxysmal atrial fibrillation) (HCC)    a.) CHA2DS2VASc = 3 (age, HTN, vascular disease history);  b.) rate/rhythm maintained on oral carvediolol (self discontinued diltiazem  + amiodarone  + metoprolol ); chronically anticoagulated with rivaroxaban  (switched from apixaban  11/17/2022)   Tobacco abuse    Past Surgical History:  Past Surgical History:  Procedure Laterality Date   SHOULDER ARTHROSCOPY WITH SUBACROMIAL DECOMPRESSION, ROTATOR CUFF REPAIR AND BICEP TENDON REPAIR Left 12/07/2022   Procedure: SHOULDER ARTHROSCOPY WITH DEBRIDEMENT, DECOMPRESSION, ROTATOR CUFF REPAIR AND BICEPS TENODESIS.- RNFA;  Surgeon: Elner Hahn, MD;  Location: [facility];  Service: Orthopedics;  Laterality: Left;   SHOULDER ARTHROSCOPY WITH SUBACROMIAL DECOMPRESSION, ROTATOR CUFF REPAIR AND BICEP TENDON REPAIR Left 02/02/2023   Procedure: LEFT SHOULDER ARTHROSCOPY WITH DEBRIDEMENT AND REPAIR OF RECURRENT ROTATOR CUFF TEAR OF LEFT SHOULDER;  Surgeon: Elner Hahn, MD;  Location: [facility];  Service: Orthopedics;  Laterality: Left;   TONSILLECTOMY     HPI:  Ronald Lowe is a 68 yo male presenting to ED 6/19 with R sided weakness and dysarthria. MRI shows multiple acute infarcts in the L MCA and L PCA territories, including the L basal ganglia, L posterior limb of the internal capsule, L anterior insula, overlying L frontal and parietal lobes, and L occipital lobe. Failed yale due to lethargy. PMH includes daily ETOH use, HTN, A-fib not on anticoagulation, ongoing tobacco use, hepatitis C   Assessment / Plan /  Recommendation Clinical Impression  Pt presents with deficits related to sustained attention, speech intelligibility, auditory comprehension, awareness, and problem solving. Pt is oriented to self but  disoriented to place, time, and situation with limited carryover of reorientation efforts. He is moderately dysarthric with <50% intelligibility at the phrase level. Although R facial and lingual weakness is mild in nature, pt is edentulous which may be further contributing to dysarthria. He follows simple commands intermittently given increased time. He was most accurate with basic biographical yes/no questions but only ~25% accurate with immediate environmental or complex yes/no questions. He is restless and needs cueing to attend to functional tasks. Mod cueing was needed for pt to locate SLP when positioned on his R side. No family was present to confirm pt's baseline, although his performance is suspected to be an acute change and is likely contributed to by EtOH withdrawal. SLP will continue following.    SLP Assessment  SLP Recommendation/Assessment: Patient needs continued Speech Language Pathology Services SLP Visit Diagnosis: Dysarthria and anarthria (R47.1);Cognitive communication deficit (R41.841)     Assistance Recommended at Discharge  Frequent or constant Supervision/Assistance  Functional Status Assessment Patient has had a recent decline in their functional status and demonstrates the ability to make significant improvements in function in a reasonable and predictable amount of time.  Frequency and Duration min 2x/week  2 weeks      SLP Evaluation Cognition  Overall Cognitive Status: No family/caregiver present to determine baseline cognitive functioning Arousal/Alertness: Awake/alert Orientation Level: Oriented to person;Disoriented to place;Disoriented to time;Disoriented to situation Attention: Sustained Sustained Attention: Impaired Sustained Attention Impairment: Verbal basic;Functional basic Memory: Impaired Memory Impairment: Decreased recall of new information Awareness: Impaired Awareness Impairment: Intellectual impairment Problem Solving: Impaired Problem  Solving Impairment: Functional basic       Comprehension  Auditory Comprehension Overall Auditory Comprehension: Impaired Yes/No Questions: Impaired Basic Biographical Questions: 76-100% accurate Basic Immediate Environment Questions: 25-49% accurate Complex Questions: 0-24% accurate Commands: Impaired One Step Basic Commands: 50-74% accurate    Expression Expression Primary Mode of Expression: Verbal Verbal Expression Overall Verbal Expression: Appears within functional limits for tasks assessed   Oral / Motor  Oral Motor/Sensory Function Overall Oral Motor/Sensory Function: Mild impairment Facial ROM: Reduced right;Suspected CN VII (facial) dysfunction Facial Symmetry: Abnormal symmetry right;Suspected CN VII (facial) dysfunction Facial Strength: Reduced right;Suspected CN VII (facial) dysfunction Facial Sensation: Within Functional Limits Lingual ROM: Within Functional Limits Lingual Symmetry: Within Functional Limits Lingual Strength: Within Functional Limits Lingual Sensation: Within Functional Limits Motor Speech Overall Motor Speech: Impaired Respiration: Within functional limits Phonation: Normal Resonance: Within functional limits Articulation: Impaired Level of Impairment: Phrase Intelligibility: Intelligibility reduced Phrase: 25-49% accurate            Amil Kale, M.A., CCC-SLP Speech Language Pathology, Acute Rehabilitation Services  Secure Chat preferred [phone]  06/07/2024, 10:45 AM

## 2024-06-07 NOTE — Progress Notes (Signed)
  Inpatient Rehab Admissions Coordinator :  Per therapy recommendations, patient was screened for CIR candidacy by Ottie Glazier RN MSN.  At this time patient appears to be a potential candidate for CIR. I will place a rehab consult per protocol for full assessment. Please call me with any questions.  Ottie Glazier RN MSN Admissions Coordinator [phone]

## 2024-06-07 NOTE — ED Notes (Signed)
 Nurse requested a new bed for the patient.  A [facility] bed was ordered.

## 2024-06-07 NOTE — Progress Notes (Addendum)
 Patient is STROKE TEAM PROGRESS NOTE    SIGNIFICANT HOSPITAL EVENTS  6/19: Presented to ED due to confusion.  Family had noticed that patient has been having some episodes of confusion and are concerned about possible dementia versus episodes being related to heavy drinking.  CTA showed occluded left M3.  MRI brain showed multiple left MCA and PCA acute infarcts.  Presented outside of window for TNK.  INTERIM HISTORY/SUBJECTIVE MRI shows patchy left MCA branch infarct.  CT angiogram shows severe proximal left ICA calcific stenosis . On exam, patient is confused with dysarthria and frequent rambling.  He does have weakness to his right arm and decreased blink to threat on the right side, right facial droop.  Unable to do echo this afternoon due to patient's combativeness and uncooperativeness.   OBJECTIVE  CBC    Component Value Date/Time   WBC 10.0 06/07/2024 0836   RBC 4.59 06/07/2024 0836   HGB 17.1 (H) 06/07/2024 0836   HGB 15.8 07/01/2022 1039   HCT 48.2 06/07/2024 0836   HCT 45.1 07/01/2022 1039   PLT 206 06/07/2024 0836   PLT 280 07/01/2022 1039   MCV 105.0 (H) 06/07/2024 0836   MCV 103 (H) 07/01/2022 1039   MCH 37.3 (H) 06/07/2024 0836   MCHC 35.5 06/07/2024 0836   RDW 14.2 06/07/2024 0836   RDW 13.7 07/01/2022 1039   LYMPHSABS 0.8 06/06/2024 2212   LYMPHSABS 0.8 07/01/2022 1039   MONOABS 0.7 06/06/2024 2212   EOSABS 0.0 06/06/2024 2212   EOSABS 0.2 07/01/2022 1039   BASOSABS 0.1 06/06/2024 2212   BASOSABS 0.1 07/01/2022 1039    BMET    Component Value Date/Time   NA 139 06/07/2024 0836   NA 133 (L) 07/01/2022 1039   K 3.7 06/07/2024 0836   CL 104 06/07/2024 0836   CO2 21 (L) 06/07/2024 0836   GLUCOSE 80 06/07/2024 0836   BUN 15 06/07/2024 0836   BUN 7 (L) 07/01/2022 1039   CREATININE 1.19 06/07/2024 0836   CALCIUM  9.4 06/07/2024 0836   EGFR 89 07/01/2022 1039   GFRNONAA >60 06/07/2024 0836    IMAGING past 24 hours ECHOCARDIOGRAM COMPLETE Result  Date: 06/07/2024    ECHOCARDIOGRAM REPORT   Patient Name:   Ronald Lowe Date of Exam: 06/07/2024 Medical Rec #:  [id number]      Height:       72.0 in Accession #:    [id number]     Weight:       151.7 lb Date of Birth:  March 26, 1956      BSA:          1.894 m Patient Age:    68 years       BP:           11/11 mmHg Patient Gender: M              HR:           1 bpm. Exam Location:  Inpatient Procedure: 2D Echo (Both Spectral and Color Flow Doppler were utilized during            procedure). Indications:    stroke  History:        Patient has no prior history of Echocardiogram examinations.  Sonographer:    Kip Peon Referring Phys: [id number] SRISHTI L BHAGAT IMPRESSIONS  1. Echo not perfomred due to patient non compliance.  2. Left ventricular ejection fraction, by estimation, is Not done%. The left ventricle has not done function. Left  ventricular endocardial border not optimally defined to evaluate regional wall motion. The left ventricular internal cavity size was not done. Left ventricular diastolic function could not be evaluated.  3. Right ventricular systolic function was not well visualized. The right ventricular size is not well visualized.  4. The mitral valve was not assessed. No evidence of mitral valve regurgitation.  5. The aortic valve was not assessed. Aortic valve regurgitation is not visualized.  6. Aortic Not imaged. FINDINGS  Left Ventricle: Left ventricular ejection fraction, by estimation, is Not done%. The left ventricle has not done function. Left ventricular endocardial border not optimally defined to evaluate regional wall motion. Strain was performed and the global longitudinal strain is indeterminate. The left ventricular internal cavity size was not done. Suboptimal image quality limits for assessment of left ventricular hypertrophy. Left ventricular diastolic function could not be evaluated. Right Ventricle: The right ventricular size is not well visualized. Right vetricular wall  thickness was not assessed. Right ventricular systolic function was not well visualized. Left Atrium: Left atrial size was not assessed. Right Atrium: Right atrial size was not assessed. Pericardium: The pericardium was not assessed. Mitral Valve: The mitral valve was not assessed. No evidence of mitral valve regurgitation. Tricuspid Valve: The tricuspid valve is not assessed. Tricuspid valve regurgitation is not demonstrated. Aortic Valve: The aortic valve was not assessed. Aortic valve regurgitation is not visualized. Pulmonic Valve: The pulmonic valve was not assessed. Pulmonic valve regurgitation is not visualized. Aorta: Not imaged and the ascending aorta was not well visualized. IAS/Shunts: The interatrial septum was not assessed. Additional Comments: Echo not perfomred due to patient non compliance. 3D was performed not requiring image post processing on an independent workstation and was indeterminate. Janelle Mediate MD Electronically signed by Janelle Mediate MD Signature Date/Time: 06/07/2024/3:38:19 PM    Final    DG Shoulder Right Result Date: 06/07/2024 CLINICAL DATA:  Right shoulder pain EXAM: RIGHT SHOULDER - 3 VIEW COMPARISON:  None Available. FINDINGS: There is no evidence of fracture or dislocation. Mild degenerative changes of the glenohumeral joint. Soft tissues are unremarkable. IMPRESSION: No acute fracture or dislocation. Electronically Signed   By: Limin  Xu M.D.   On: 06/07/2024 14:45   VAS US  CAROTID Result Date: 06/07/2024 Carotid Arterial Duplex Study Patient Name:  Ronald Lowe  Date of Exam:   06/07/2024 Medical Rec #: [id number]       Accession #:    [id number] Date of Birth: 02-19-1956       Patient Gender: M Patient Age:   68 years Exam Location:  [facility] Procedure:      VAS US  CAROTID Referring Phys: Raevon Broom --------------------------------------------------------------------------------  Indications:       Carotid artery disease. Risk Factors:      Hypertension,  hyperlipidemia, current smoker, coronary artery                    disease, PAD. Other Factors:     GERD, COPD, EtOH, CKD. Comparison Study:  CT Angio neck - Right ICA approximately 55% stenosis.                    Left ICA, nearly occlusive stenosis at the left carotid                    bifurcation with bulky calcific artherosclerosis. Performing Technologist: Franky Ivanoff Sturdivant-Jones RDMS, RVT  Examination Guidelines: A complete evaluation includes B-mode imaging, spectral Doppler, color Doppler, and power Doppler as  needed of all accessible portions of each vessel. Bilateral testing is considered an integral part of a complete examination. Limited examinations for reoccurring indications may be performed as noted.  Right Carotid Findings: +----------+--------+--------+--------+------------------+---------+           PSV cm/sEDV cm/sStenosisPlaque DescriptionComments  +----------+--------+--------+--------+------------------+---------+ CCA Prox  78      18                                          +----------+--------+--------+--------+------------------+---------+ CCA Distal79      22                                          +----------+--------+--------+--------+------------------+---------+ ICA Prox  111     38      1-39%   calcific          Shadowing +----------+--------+--------+--------+------------------+---------+ ICA Mid   102     22                                          +----------+--------+--------+--------+------------------+---------+ ICA Distal78      30                                          +----------+--------+--------+--------+------------------+---------+ ECA       137     26                                          +----------+--------+--------+--------+------------------+---------+ +----------+--------+-------+----------------+-------------------+           PSV cm/sEDV cmsDescribe        Arm Pressure (mmHG)  +----------+--------+-------+----------------+-------------------+ Subclavian120            Multiphasic, WNL                    +----------+--------+-------+----------------+-------------------+ +---------+--------+--+--------+--+---------+ VertebralPSV cm/s57EDV cm/s15Antegrade +---------+--------+--+--------+--+---------+  Left Carotid Findings: +----------+--------+--------+--------+------------------+---------+           PSV cm/sEDV cm/sStenosisPlaque DescriptionComments  +----------+--------+--------+--------+------------------+---------+ CCA Prox  61      13                                          +----------+--------+--------+--------+------------------+---------+ CCA Distal44      11                                          +----------+--------+--------+--------+------------------+---------+ ICA Prox  311     90      60-79%  calcific          Shadowing +----------+--------+--------+--------+------------------+---------+ ICA Mid   49      18                                          +----------+--------+--------+--------+------------------+---------+  ICA Distal49      18                                          +----------+--------+--------+--------+------------------+---------+ ECA       43      13                                          +----------+--------+--------+--------+------------------+---------+ +----------+--------+--------+----------------+-------------------+           PSV cm/sEDV cm/sDescribe        Arm Pressure (mmHG) +----------+--------+--------+----------------+-------------------+ [id number]             Multiphasic, WNL                    +----------+--------+--------+----------------+-------------------+ +---------+--------+--+--------+--+---------+ VertebralPSV cm/s85EDV cm/s19Antegrade +---------+--------+--+--------+--+---------+   Summary: Right Carotid: Velocities in the right ICA are consistent with  a 1-39% stenosis.                ICA velocities suggest upper end of scale. May be underestimated                due to heavey calcified plaque shadowing. Left Carotid: Velocities in the left ICA are consistent with a 60-79% stenosis.               ICA velocities suggest upper end of scale. May be underestimated               due to heavy calcified plaque shadowing.  *See table(s) above for measurements and observations.     Preliminary    DG Forearm Right Result Date: 06/07/2024 CLINICAL DATA:  144615 Pain 144615 EXAM: RIGHT FOREARM - 2 VIEW COMPARISON:  None Available. FINDINGS: No acute fracture or dislocation. There is no evidence of arthropathy or other focal bone abnormality. Soft tissues are unremarkable. IMPRESSION: No acute fracture or dislocation. Electronically Signed   By: Rance Burrows M.D.   On: 06/07/2024 10:57   DG Chest Portable 1 View Result Date: 06/07/2024 EXAM: 1 VIEW XRAY OF THE CHEST 06/07/2024 01:01:32 AM COMPARISON: CT chest dated 01/29/2024. CLINICAL HISTORY: Coughing. FINDINGS: LUNGS AND PLEURA: No focal pulmonary opacity. No pulmonary edema. No pleural effusion. No pneumothorax. HEART AND MEDIASTINUM: No acute abnormality of the cardiac and mediastinal silhouettes. Thoracic aortic atherosclerosis. BONES AND SOFT TISSUES: No acute osseous abnormality. IMPRESSION: 1. No acute findings. Electronically signed by: Zadie Herter MD 06/07/2024 01:04 AM EDT RP Workstation: [id number]   MR BRAIN WO CONTRAST Result Date: 06/06/2024 CLINICAL DATA:  Neuro deficit, acute, stroke suspected EXAM: MRI HEAD WITHOUT CONTRAST TECHNIQUE: Multiplanar, multiecho pulse sequences of the brain and surrounding structures were obtained without intravenous contrast. COMPARISON:  Same day CTA head/neck and CT head. FINDINGS: Brain: Multiple acute infarcts in the left MCA and left PCA territories, including the left basal ganglia, left posterior limb of the internal capsule, left anterior insula,  overlying left frontal and parietal lobes and the left occipital lobe. Mild associated edema in the anterior insula. No mass effect. No midline shift. Additional moderate patchy T2/FLAIR hyperintensities, compatible with chronic microvascular ischemic disease. No mass lesion or hydrocephalus. No extra-axial fluid collections. Vascular: Better evaluated on same day CTA. Skull and upper cervical spine: Normal marrow signal. Sinuses/Orbits: Clear sinuses.  No acute orbital findings. Other: No  mastoid effusions. IMPRESSION: Multiple acute infarcts in the left MCA and left PCA territories, including the left basal ganglia, left posterior limb of the internal capsule, left anterior insula, overlying left frontal and parietal lobes and the left occipital lobe. No mass effect. Electronically Signed   By: Stevenson Elbe M.D.   On: 06/06/2024 23:29   CT ANGIO HEAD NECK W WO CM W PERF (CODE STROKE) Result Date: 06/06/2024 CLINICAL DATA:  Neuro deficit, acute, stroke suspected EXAM: CT ANGIOGRAPHY HEAD AND NECK CT PERFUSION BRAIN TECHNIQUE: Multidetector CT imaging of the head and neck was performed using the standard protocol during bolus administration of intravenous contrast. Multiplanar CT image reconstructions and MIPs were obtained to evaluate the vascular anatomy. Carotid stenosis measurements (when applicable) are obtained utilizing NASCET criteria, using the distal internal carotid diameter as the denominator. Multiphase CT imaging of the brain was performed following IV bolus contrast injection. Subsequent parametric perfusion maps were calculated using RAPID software. RADIATION DOSE REDUCTION: This exam was performed according to the departmental dose-optimization program which includes automated exposure control, adjustment of the mA and/or kV according to patient size and/or use of iterative reconstruction technique. CONTRAST:  OMNIPAQUE  IOHEXOL  350 MG/ML SOLN COMPARISON:  Same day CT head. FINDINGS:  CTA NECK FINDINGS Aortic arch: Aortic atherosclerosis. Great vessel origins are patent. Right carotid system: Atherosclerosis at the carotid bifurcation and involving the proximal ICA with approximately 55% stenosis. Left carotid system: Critical, nearly occlusive stenosis at the left carotid bifurcation. The degree of stenosis is difficult to quantify given the degree of bulky calcific atherosclerosis but likely at least 80%. More distal ICA is patent but diminutive. Vertebral arteries: Right dominant. Mild bilateral vertebral artery origin stenosis. Moderate right V2 vertebral artery stenosis due to compression from adjacent osteophyte. Skeleton: Severe multilevel degenerative change in the cervical spine. No acute abnormality on limited assessment. Other neck: No acute abnormality on limited assessment. Upper chest: Emphysema.  Visualized lung apices are clear. Review of the MIP images confirms the above findings CTA HEAD FINDINGS Anterior circulation: Bilateral intracranial ICAs are patent without hemodynamically significant stenosis. Bilateral M1 MCAs and the ACAs are patent. Hypoplastic left A1 ACA. Occluded proximal left M3 MCA just above the anterior sylvian fissure (for example see series 8, images 112 through 114). Posterior circulation: Bilateral intradural vertebral arteries, basilar artery and bilateral posterior cerebral arteries are patent without proximal hemodynamically significant stenosis. Venous sinuses: As permitted by contrast timing, patent. Review of the MIP images confirms the above findings CT Brain Perfusion Findings: ASPECTS: 9 CBF (<30%) Volume: 0mL Perfusion (Tmax>6.0s) volume: Mismatch Volume: spanning multiple vascular territories bilaterally both infratentorial and supratentorial and therefore likely largely artifactual. Infarction Location:No core infarct identified. IMPRESSION: CTA: 1. Occluded proximal left M3 MCA. 2. Critical, nearly occlusive stenosis at the left  carotid bifurcation. The degree of stenosis is difficult to quantify given the degree of bulky calcific atherosclerosis but likely at least 80%. 3. Approximately 55% stenosis of the right ICA. 4. Moderate right V2 vertebral artery stenosis due to compression from adjacent osteophyte. CTP: Nondiagnostic perfusion. Findings discussed with Dr. Cleone Dad via telephone at 10:30 p.m. Electronically Signed   By: Stevenson Elbe M.D.   On: 06/06/2024 22:48   CT HEAD CODE STROKE WO CONTRAST Result Date: 06/06/2024 CLINICAL DATA:  Code stroke.  Neuro deficit, acute, stroke suspected EXAM: CT HEAD WITHOUT CONTRAST TECHNIQUE: Contiguous axial images were obtained from the base of the skull through the vertex without intravenous contrast. RADIATION DOSE REDUCTION:  This exam was performed according to the departmental dose-optimization program which includes automated exposure control, adjustment of the mA and/or kV according to patient size and/or use of iterative reconstruction technique. COMPARISON:  None Available. FINDINGS: Brain: No evidence of acute infarction, hemorrhage, hydrocephalus, extra-axial collection or mass lesion/mass effect. Moderate patchy white matter hypodensities, compatible with chronic microvascular ischemic disease. Vascular: No hyperdense vessel identified. Skull: Normal. Negative for fracture or focal lesion. Sinuses/Orbits: Clear sinuses.  No acute orbital findings. Other: No mastoid effusions. ASPECTS [facility] Stroke Program Early CT Score) Total score (0-10 with 10 being normal): 9. IMPRESSION: 1. Subtle hypoattenuation in the anterior left insula, concerning for acute infarct. ASPECTS 9. 2. Moderate chronic microvascular ischemic disease. Findings discussed with Dr. Cleone Dad via telephone at 10:30 p.m. Electronically Signed   By: Stevenson Elbe M.D.   On: 06/06/2024 22:37    Vitals:   06/07/24 1300 06/07/24 1308 06/07/24 1315 06/07/24 1330  BP: (!) 193/111  (!) 137/119 (!) 165/117  Pulse:  (!) 102  100 95  Resp: 19  20 17   Temp:  98.6 F (37 C)    TempSrc:      SpO2: 97%  97% 96%  Weight:      Height:         PHYSICAL EXAM General: Alert, appears somewhat chronically ill Psych: Cooperative, restless CV: Regular rate and rhythm on monitor Respiratory:  Regular, unlabored respirations on room air GI: Abdomen soft and nontender   NEURO:  Mental Status: Oriented to self.  Disoriented to age, place, situation.  Reduced attention/concentration.  Speech/Language: Moderate dysarthria.  Difficulty following multistep commands.  Cranial Nerves:  II: PERRL.  Reduced blink to threat on right III, IV, VI: Left gaze preference with ability to cross midline, with full tracking of examiner. V: Sensation is intact to light touch and symmetrical to face.  VII: Right facial droop VIII: hearing intact to voice. IX, X: Palate elevates symmetrically. Phonation is normal.  UU:VOZDGUYQ shrug 5/5. XII: tongue is midline without fasciculations. Motor: RUE: 4 -/5 with mild drift. No drift of remaining extremities. Tone: is normal and bulk is normal Sensation- Intact to light touch bilaterally. Extinction absent to light touch to DSS.   Coordination: FTN intact bilaterally, reduced on right side due to weakness. Gait- deferred  Most Recent NIH: 75     ASSESSMENT/PLAN  [name] is a 68 y.o. male with history of atrial fibrillation not on anticoagulation, hypertension, hyperlipidemia, alcohol abuse, ongoing smoking, cervical spine degenerative disc disease who was admitted for acute stroke, potentially cardioembolic from A-fib .  NIH on Admission: 9.  Acute Ischemic Infarct:  left MCA and PCA territories  Etiology:   Symptomatic proximal left ICA stenosis CT Head without contrast(Personally reviewed): No evidence of acute intracranial abnormality [initially however on review after CTA completed, there does seem to be some left insular hypodensity concerning for likely  acute stroke with aspects of 9]. Moderate presumed chronic microvascular ischemic disease. CT angio Head and Neck with contrast(Personally reviewed): Occluded proximal left M3 MCA. Critical, nearly occlusive stenosis at the left carotid bifurcation. The degree of stenosis is difficult to quantify given the degree of bulky calcific atherosclerosis but likely at least 80%. Approximately 55% stenosis of the right ICA. Moderate right V2 vertebral artery stenosis due to compression from adjacent osteophyte. Recommend IR consult for angiogram with possible intervention. Patient has history of poor medication compliance and may not be a good candidate for stent placement.  MRI Brain(Personally reviewed):  Multiple acute infarcts in the left MCA and left PCA territories, including the left basal ganglia, left posterior limb of the internal capsule, left anterior insula, overlying left frontal and parietal lobes and the left occipital lobe. No mass effect. Carotid Doppler   L ICA 60-79% stenosis NeuroIR consult? 2D Echo: Suboptimal due to patient noncooperation  LDL 66 HgbA1c 4.2 VTE prophylaxis - lovenox  No antithrombotic prior to admission, now on aspirin  81 mg daily. Will need to be on DAPT once able to take PO.  Therapy recommendations:  CIR Disposition:  pending  Atrial fibrillation Home Meds: no OAC due to cost per family, noted on chart that patient does not like how it makes him feel.  Continue telemetry monitoring Begin anticoagulation with ASA, DAPT once able to take PO   Hypertension Home meds: Lisinopril  5 mg Stable Blood Pressure Goal: BP less than 220/110 permissive hypertension for first 24 hours after symptom onset then gradually normalize. Avoid hypotension due to intracranial stenosis seen  Hyperlipidemia Home meds:  none LDL 66, goal < 70 High intensity statin not indicated due to LDL within goal at current treatment   Tobacco Abuse Patient smokes 0.25 packs per day  for 53 years, smokes marijuana Will provide cessation education once appropriate Nicotine  replacement therapy provided  Substance Abuse UDS positive for THC, cocaine, benzos, amphetamines Will provide cessation education once appropriate TOC consult for cessation placed  Dysphagia Patient has post-stroke dysphagia, SLP consulted    Diet   DIET DYS 2 Room service appropriate? Yes with Assist; Fluid consistency: Thin   Advance diet as tolerated  Other Stroke Risk Factors Hx of ETOH abuse, alcohol level <15, advised to drink no more than 1-2 drink(s) a day Family hx stroke (mother)   Other Active Problems History of medication noncompliance  Hospital day # 0   Pt seen by Neuro NP/APP with MD. Note/plan to be edited by MD as needed.    Audrene Lease, DNP [facility] Please use AMION for contact information & EPIC for messaging.  I have personally obtained history,examined this patient, reviewed notes, independently viewed imaging studies, participated in medical decision making and plan of care.ROS completed by me personally and pertinent positives fully documented  I have made any additions or clarifications directly to the above note. Agree with note above.  Patient presented with confusion and MRI scan shows patchy left MCA branch infarct with CT angiogram and carotid ultrasound showing moderate to severe proximal left ICA stenosis symptomatic.  Unclear as to patient's baseline cognitive status and functioning hence difficulty made definitive decision about left artery revascularization at this time.  Hopefully patient will get better over the next few days and help in decision-making.  Given his history of substance abuse his ability to follow-up and be compliant with treatment is questionable at this point.  Recommend aspirin  and Plavix and aggressive risk factor modification.  Continue ongoing stroke workup. Greater than 50% time during this 50-minute visit spent in  counseling and coordination of care about stroke and symptomatic carotid stenosis discussion and answering questions.  Ardella Beaver, MD Medical Director [facility] Pager: [phone] 06/07/2024 4:33 PM   To contact Stroke Continuity provider, please refer to [email or web address]. After hours, contact General Neurology

## 2024-06-07 NOTE — Evaluation (Signed)
 Physical Therapy Evaluation Patient Details Name: Ronald Lowe MRN: [id number] DOB: 14-Jul-1956 Today's Date: 06/07/2024  History of Present Illness  Pt is a 68 y.o. male admitted 6/19. He presented to the ED with confusion, R side weakness, slurred speech, and L gaze. MRI revealed multiple acute infarcts in the L MCA and left PCA territories. PMH: daily alcohol use, HTN, a fib not on anticoagulation (secondary to cost per family, per chart review patient reported not liking how the medication made him feel), ongoing smoking, hep C   Clinical Impression  Pt admitted with above diagnosis. PTA pt lived at home with family, independent and working. Pt currently with functional limitations due to the deficits listed below (see PT Problem List). On eval, pt required +2 mod assist bed mobility and demo poor sitting balance. Decreased strength/coordination RUE/LE, decreased balance, L gaze preference, and R inattention. Expressive language deficits and slurred speech. Pt very restless with difficulty attending to task, likely relating to alcohol withdrawal. Suspect his mobility progress will improve after he is through the withdrawal stage. Pt will benefit from acute skilled PT to increase their independence and safety with mobility to allow discharge. Post acute, pt would benefit from further therapy in inpatient setting, > 3 hours/day.         If plan is discharge home, recommend the following: Two people to help with walking and/or transfers;Two people to help with bathing/dressing/bathroom;Assistance with cooking/housework;Assist for transportation   Can travel by private vehicle        Equipment Recommendations Other (comment) (TBD)  Recommendations for Other Services  Rehab consult    Functional Status Assessment Patient has had a recent decline in their functional status and demonstrates the ability to make significant improvements in function in a reasonable and predictable amount of time.      Precautions / Restrictions Precautions Precautions: Fall;Other (comment) Recall of Precautions/Restrictions: Impaired Precaution/Restrictions Comments: R forearm xray pending at time of eval      Mobility  Bed Mobility Overal bed mobility: Needs Assistance Bed Mobility: Supine to Sit, Sit to Supine     Supine to sit: +2 for physical assistance, Mod assist, HOB elevated Sit to supine: +2 for physical assistance, Mod assist, HOB elevated   General bed mobility comments: assist with trunk and RLE    Transfers                   General transfer comment: unable to safely progress beyond edge of stretcher in ED    Ambulation/Gait                  Stairs            Wheelchair Mobility     Tilt Bed    Modified Rankin (Stroke Patients Only) Modified Rankin (Stroke Patients Only) Pre-Morbid Rankin Score: No symptoms Modified Rankin: Severe disability     Balance Overall balance assessment: Needs assistance Sitting-balance support: Bilateral upper extremity supported Sitting balance-Leahy Scale: Poor Sitting balance - Comments: mod assist to maintain sitting balance, increased R lean with fatigue Postural control: Right lateral lean                                   Pertinent Vitals/Pain Pain Assessment Pain Assessment: Faces Faces Pain Scale: Hurts little more Pain Location: Pt reporting pain but unable to specify location. Pain Descriptors / Indicators: Grimacing Pain Intervention(s): Monitored during session  Home Living Family/patient expects to be discharged to:: Private residence Living Arrangements: Spouse/significant other Available Help at Discharge: Family Type of Home: House Home Access: Stairs to enter   Secretary/administrator of Steps: 1   Home Layout: One level Home Equipment: None Additional Comments: Pt unable to provide hx. Most info obtained from previous admission (2022).    Prior Function Prior  Level of Function : Independent/Modified Independent;Patient poor historian/Family not available;Working/employed             Mobility Comments: Per H&P, pt last seen normal 6/19 AM when he left for work.       Extremity/Trunk Assessment   Upper Extremity Assessment Upper Extremity Assessment: Defer to OT evaluation    Lower Extremity Assessment Lower Extremity Assessment: RLE deficits/detail RLE Deficits / Details: AROM on command (SLR and ankle pump), moving in synergistic pattern, unable to follow commands for MMT       Communication   Communication Communication: Impaired Factors Affecting Communication: Difficulty expressing self;Reduced clarity of speech    Cognition Arousal: Lethargic Behavior During Therapy: Restless   PT - Cognitive impairments: No family/caregiver present to determine baseline, Difficult to assess, Orientation, Awareness, Memory, Attention, Problem solving, Safety/Judgement Difficult to assess due to: Impaired communication Orientation impairments: Place, Time, Situation                     Following commands: Impaired Following commands impaired: Follows one step commands inconsistently, Follows one step commands with increased time     Cueing       General Comments General comments (skin integrity, edema, etc.): L gaze preference. Able to visually cross midline with cues. BP prior to mobility 183/95, BP after 172/120. Pt restless so unsure of accuracy.    Exercises     Assessment/Plan    PT Assessment Patient needs continued PT services  PT Problem List Decreased balance;Pain;Decreased cognition;Decreased mobility;Decreased activity tolerance;Decreased safety awareness;Decreased coordination;Decreased strength       PT Treatment Interventions Therapeutic activities;DME instruction;Cognitive remediation;Gait training;Therapeutic exercise;Patient/family education;Balance training;Functional mobility training;Neuromuscular  re-education    PT Goals (Current goals can be found in the Care Plan section)  Acute Rehab PT Goals Patient Stated Goal: unable to state PT Goal Formulation: Patient unable to participate in goal setting Time For Goal Achievement: 06/21/24 Potential to Achieve Goals: Good    Frequency Min 3X/week     Co-evaluation PT/OT/SLP Co-Evaluation/Treatment: Yes Reason for Co-Treatment: For patient/therapist safety;To address functional/ADL transfers;Necessary to address cognition/behavior during functional activity PT goals addressed during session: Mobility/safety with mobility;Balance         AM-PAC PT 6 Clicks Mobility  Outcome Measure Help needed turning from your back to your side while in a flat bed without using bedrails?: A Lot Help needed moving from lying on your back to sitting on the side of a flat bed without using bedrails?: A Lot Help needed moving to and from a bed to a chair (including a wheelchair)?: Total Help needed standing up from a chair using your arms (e.g., wheelchair or bedside chair)?: Total Help needed to walk in hospital room?: Total Help needed climbing 3-5 steps with a railing? : Total 6 Click Score: 8    End of Session   Activity Tolerance: Patient tolerated treatment well Patient left: in bed;with call bell/phone within reach Nurse Communication: Mobility status;Other (comment) (restlessness, decreased safety awareness) PT Visit Diagnosis: Other abnormalities of gait and mobility (R26.89);Hemiplegia and hemiparesis Hemiplegia - Right/Left: Right Hemiplegia - caused by: Cerebral infarction  Time: 1914-7829 PT Time Calculation (min) (ACUTE ONLY): 13 min   Charges:   PT Evaluation $PT Eval Moderate Complexity: 1 Mod   PT General Charges $$ ACUTE PT VISIT: 1 Visit         Dorothye Gathers., PT  Office # [phone]   Guadelupe Leech 06/07/2024, 10:07 AM

## 2024-06-07 NOTE — Evaluation (Signed)
 Occupational Therapy Evaluation Patient Details Name: Ronald Lowe MRN: [id number] DOB: [birth year] Today's Date: 06/07/2024   History of Present Illness   Pt is a 68 y.o. male admitted 6/19. He presented to the ED with confusion, R side weakness, slurred speech, and L gaze. MRI revealed multiple acute infarcts in the L MCA and left PCA territories. PMH: daily alcohol use, HTN, a fib not on anticoagulation (secondary to cost per family, per chart review patient reported not liking how the medication made him feel), ongoing smoking, hep C     Clinical Impressions Javan was evaluated s/p the above admission list. Per chart review, he is indep working and driving at baseline. Upon evaluation the pt was limited by confusion, impaired cognition, slurred expressive communication, poor sitting balance with R lateral lean, limited insight to safety and deficits, global weakness with R>L, R arm pain?, and limited tolerance for activity. Overall he required mod A +2 for bed mobility and was unable to attempt standing. In sitting he had a R lateral lean and did not attempt to right is balance losses. Due to the deficits listed below the pt also needs total A for LB ADLs and max A for UB ADLs. Pt will benefit from continued acute OT services and intensive inpatient follow up therapy, >3 hours/day after discharge.       If plan is discharge home, recommend the following:   A lot of help with walking and/or transfers;Two people to help with walking and/or transfers;A lot of help with bathing/dressing/bathroom;Two people to help with bathing/dressing/bathroom;Assistance with cooking/housework;Assistance with feeding;Direct supervision/assist for medications management;Direct supervision/assist for financial management;Assist for transportation;Help with stairs or ramp for entrance;Supervision due to cognitive status     Functional Status Assessment   Patient has had a recent decline in their functional  status and demonstrates the ability to make significant improvements in function in a reasonable and predictable amount of time.     Equipment Recommendations   BSC/3in1;Other (comment) (RW)     Recommendations for Other Services   Rehab consult     Precautions/Restrictions   Precautions Precautions: Fall;Other (comment) Recall of Precautions/Restrictions: Impaired Precaution/Restrictions Comments: R forearm xray pending at time of eval     Mobility Bed Mobility Overal bed mobility: Needs Assistance Bed Mobility: Supine to Sit, Sit to Supine     Supine to sit: +2 for physical assistance, Mod assist, HOB elevated Sit to supine: +2 for physical assistance, Mod assist, HOB elevated   General bed mobility comments: assist with trunk and RLE    Transfers                   General transfer comment: unable to safely progress beyond edge of stretcher in ED      Balance Overall balance assessment: Needs assistance Sitting-balance support: Bilateral upper extremity supported Sitting balance-Leahy Scale: Poor Sitting balance - Comments: mod assist to maintain sitting balance, increased R lean with fatigue Postural control: Right lateral lean                                 ADL either performed or assessed with clinical judgement   ADL Overall ADL's : Needs assistance/impaired Eating/Feeding: NPO   Grooming: Maximal assistance   Upper Body Bathing: Cueing for safety   Lower Body Bathing: Total assistance;+2 for physical assistance;+2 for safety/equipment   Upper Body Dressing : Maximal assistance   Lower Body Dressing: +2 for  physical assistance;Total assistance;+2 for safety/equipment   Toilet Transfer: Total assistance;+2 for physical assistance;+2 for safety/equipment   Toileting- Clothing Manipulation and Hygiene: Total assistance;+2 for physical assistance;+2 for safety/equipment       Functional mobility during ADLs: Maximal  assistance;+2 for physical assistance;+2 for safety/equipment General ADL Comments: assist due to impaired cognition, poor sitting balance, R weakness,     Vision Baseline Vision/History: 1 Wears glasses Vision Assessment?: Vision impaired- to be further tested in functional context Additional Comments: unable to track finger, likely due to cognition. pt acurately read therapists badge in the L environment and stated therapist shirt color in the R environemnt.     Perception Perception: Not tested       Praxis Praxis: Not tested       Pertinent Vitals/Pain Pain Assessment Pain Assessment: Faces Faces Pain Scale: Hurts little more Pain Location: Pt reporting pain but unable to specify location. Pain Descriptors / Indicators: Grimacing Pain Intervention(s): Limited activity within patient's tolerance     Extremity/Trunk Assessment Upper Extremity Assessment Upper Extremity Assessment: RUE deficits/detail;LUE deficits/detail RUE Deficits / Details: difficult to assess, seemingly reported pain somewhere on the R extremeity. R forearm xray reult pending at the time of evaluation. Pt actively moving arm some, difficult to assess due to impaired cognition RUE Coordination: decreased gross motor;decreased fine motor LUE Deficits / Details: Pt able to lift arm against gravity, does not hold against any resistance. Difficult to truly assess due to impaired cognition LUE Coordination: decreased fine motor;decreased gross motor   Lower Extremity Assessment Lower Extremity Assessment: Defer to PT evaluation RLE Deficits / Details: AROM on command (SLR and ankle pump), moving in synergistic pattern, unable to follow commands for MMT   Cervical / Trunk Assessment Cervical / Trunk Assessment: Normal   Communication Communication Communication: Impaired Factors Affecting Communication: Difficulty expressing self;Reduced clarity of speech   Cognition Arousal: Lethargic Behavior During  Therapy: Restless Cognition: Cognition impaired             OT - Cognition Comments: pt able to state his name and follow some simple commands with multimodal cues. Pt's speech is difficult to comprehend. Pt asking to go home.                 Following commands: Impaired Following commands impaired: Follows one step commands inconsistently, Follows one step commands with increased time     Cueing  General Comments   Cueing Techniques: Verbal cues;Gestural cues;Tactile cues  L gaze preference. Able to visually cross midline with cues. BP prior to mobility 183/95, BP after 172/120. Pt restless so unsure of accuracy.           Home Living Family/patient expects to be discharged to:: Private residence Living Arrangements: Spouse/significant other Available Help at Discharge: Family Type of Home: House Home Access: Stairs to enter Secretary/administrator of Steps: 1   Home Layout: One level     Bathroom Shower/Tub: Chief Strategy Officer: Standard     Home Equipment: None   Additional Comments: Pt unable to provide hx. Most info obtained from previous admission (2022).      Prior Functioning/Environment Prior Level of Function : Independent/Modified Independent;Patient poor historian/Family not available;Working/employed             Mobility Comments: Per H&P, pt last seen normal 6/19 AM when he left for work.      OT Problem List: Decreased strength;Decreased range of motion;Decreased activity tolerance;Impaired balance (sitting and/or standing);Decreased cognition;Decreased coordination;Decreased safety  awareness;Decreased knowledge of use of DME or AE;Impaired UE functional use   OT Treatment/Interventions: Self-care/ADL training;Therapeutic exercise;DME and/or AE instruction;Therapeutic activities;Balance training;Patient/family education      OT Goals(Current goals can be found in the care plan section)   Acute Rehab OT Goals Patient  Stated Goal: home OT Goal Formulation: With patient Time For Goal Achievement: 06/21/24 Potential to Achieve Goals: Good ADL Goals Pt Will Perform Grooming: with set-up Pt Will Perform Upper Body Dressing: with set-up Pt Will Perform Lower Body Dressing: with mod assist;sit to/from stand Pt Will Transfer to Toilet: with mod assist;bedside commode;stand pivot transfer Additional ADL Goal #1: Pt will follow simple 1 step commands 75% of the time during ADLs   OT Frequency:  Min 2X/week    Co-evaluation PT/OT/SLP Co-Evaluation/Treatment: Yes Reason for Co-Treatment: For patient/therapist safety;To address functional/ADL transfers;Necessary to address cognition/behavior during functional activity PT goals addressed during session: Mobility/safety with mobility;Balance OT goals addressed during session: ADL's and self-care      AM-PAC OT 6 Clicks Daily Activity     Outcome Measure Help from another person eating meals?: Total Help from another person taking care of personal grooming?: A Lot Help from another person toileting, which includes using toliet, bedpan, or urinal?: Total Help from another person bathing (including washing, rinsing, drying)?: A Lot Help from another person to put on and taking off regular upper body clothing?: A Lot Help from another person to put on and taking off regular lower body clothing?: Total 6 Click Score: 9   End of Session Nurse Communication: Mobility status (Advised RN to place bed alarm)  Activity Tolerance: Patient tolerated treatment well Patient left: in bed;with call bell/phone within reach  OT Visit Diagnosis: Unsteadiness on feet (R26.81);Other abnormalities of gait and mobility (R26.89);Muscle weakness (generalized) (M62.81);Hemiplegia and hemiparesis Hemiplegia - Right/Left: Right Hemiplegia - caused by: Cerebral infarction                Time: 9562-1308 OT Time Calculation (min): 14 min Charges:  OT General Charges $OT Visit: 1  Visit OT Evaluation $OT Eval Moderate Complexity: 1 Mod  Veryl Gottron, OTR/L Acute Rehabilitation Services Office [phone] Secure Chat Communication Preferred   Starla Easterly 06/07/2024, 10:15 AM

## 2024-06-07 NOTE — Progress Notes (Signed)
 Attempted scheduled echocardiogram at 3:30pm- PT combative and not cooperating for exam. Will re-attempt echo tomorrow morning.   Kip Peon, RDCS

## 2024-06-07 NOTE — Progress Notes (Signed)
 CSW added substance abuse resources to patient's AVS.  Edwin Dada, MSW, LCSW Transitions of Care  Clinical Social Worker II [phone]

## 2024-06-07 NOTE — Plan of Care (Signed)

## 2024-06-07 NOTE — Hospital Course (Signed)
 68 y.o. year old male with past medical history of hypertension, hyperlipidemia, A-fib noncompliant with anticoagulation, GERD, tobacco use disorder, CKD, COPD, EtOH use disorder.  Ronald Lowe presents to [name] ED after family found him sitting on the floor confused with right arm weakness.  LKN was 06/06/24 at 7:30 AM when he left for work.  Family notes that he has been having frequent episodes of confusion and they have been concerned about dementia versus the confusion being related to his heavy drinking. History primarily obtained from review of chart as Ronald Lowe is not able to fully participate in interview, he can answer some basic questions appropriately at time of my interview.

## 2024-06-08 DIAGNOSIS — I4891 Unspecified atrial fibrillation: Secondary | ICD-10-CM | POA: Diagnosis not present

## 2024-06-08 DIAGNOSIS — F1510 Other stimulant abuse, uncomplicated: Secondary | ICD-10-CM

## 2024-06-08 DIAGNOSIS — I63512 Cerebral infarction due to unspecified occlusion or stenosis of left middle cerebral artery: Secondary | ICD-10-CM

## 2024-06-08 DIAGNOSIS — R29708 NIHSS score 8: Secondary | ICD-10-CM | POA: Diagnosis not present

## 2024-06-08 DIAGNOSIS — I639 Cerebral infarction, unspecified: Secondary | ICD-10-CM

## 2024-06-08 DIAGNOSIS — E538 Deficiency of other specified B group vitamins: Secondary | ICD-10-CM

## 2024-06-08 DIAGNOSIS — I6523 Occlusion and stenosis of bilateral carotid arteries: Secondary | ICD-10-CM | POA: Diagnosis not present

## 2024-06-08 DIAGNOSIS — F1210 Cannabis abuse, uncomplicated: Secondary | ICD-10-CM

## 2024-06-08 DIAGNOSIS — F1410 Cocaine abuse, uncomplicated: Secondary | ICD-10-CM

## 2024-06-08 DIAGNOSIS — I69391 Dysphagia following cerebral infarction: Secondary | ICD-10-CM

## 2024-06-08 LAB — COMPREHENSIVE METABOLIC PANEL WITH GFR
ALT: 20 U/L (ref 0–44)
AST: 40 U/L (ref 15–41)
Albumin: 3.7 g/dL (ref 3.5–5.0)
Alkaline Phosphatase: 54 U/L (ref 38–126)
Anion gap: 18 — ABNORMAL HIGH (ref 5–15)
BUN: 14 mg/dL (ref 8–23)
CO2: 15 mmol/L — ABNORMAL LOW (ref 22–32)
Calcium: 8.9 mg/dL (ref 8.9–10.3)
Chloride: 106 mmol/L (ref 98–111)
Creatinine, Ser: 1.07 mg/dL (ref 0.61–1.24)
GFR, Estimated: >60 mL/min (ref >60)
Glucose, Bld: 75 mg/dL (ref 70–99)
Potassium: 3.5 mmol/L (ref 3.5–5.1)
Sodium: 139 mmol/L (ref 135–145)
Total Bilirubin: 2.2 mg/dL — ABNORMAL HIGH (ref 0.0–1.2)
Total Protein: 6.4 g/dL — ABNORMAL LOW (ref 6.5–8.1)

## 2024-06-08 LAB — CBC
HCT: 46.4 % (ref 39.0–52.0)
Hemoglobin: 16.8 g/dL (ref 13.0–17.0)
MCH: 37.4 pg — ABNORMAL HIGH (ref 26.0–34.0)
MCHC: 36.2 g/dL — ABNORMAL HIGH (ref 30.0–36.0)
MCV: 103.3 fL — ABNORMAL HIGH (ref 80.0–100.0)
Platelets: 178 10*3/uL (ref 150–400)
RBC: 4.49 MIL/uL (ref 4.22–5.81)
RDW: 13.9 % (ref 11.5–15.5)
WBC: 9 10*3/uL (ref 4.0–10.5)
nRBC: 0 % (ref 0.0–0.2)

## 2024-06-08 LAB — MAGNESIUM: Magnesium: 1.5 mg/dL — ABNORMAL LOW (ref 1.7–2.4)

## 2024-06-08 MED ORDER — HALOPERIDOL LACTATE 5 MG/ML IJ SOLN
2.0000 mg | Freq: Four times a day (QID) | INTRAMUSCULAR | Status: DC | PRN
  Administered 2024-06-08 – 2024-06-13 (×8): 2 mg via INTRAMUSCULAR
  Filled 2024-06-08 (×8): qty 1

## 2024-06-08 MED ORDER — LACTATED RINGERS IV SOLN: INTRAVENOUS | Status: DC

## 2024-06-08 MED ORDER — CLOPIDOGREL BISULFATE 75 MG PO TABS
75.0000 mg | ORAL_TABLET | Freq: Every day | ORAL | Status: DC
  Administered 2024-06-08 – 2024-06-14 (×7): 75 mg via ORAL
  Filled 2024-06-08 (×7): qty 1

## 2024-06-08 MED ORDER — CYANOCOBALAMIN 1000 MCG/ML IJ SOLN
1000.0000 ug | Freq: Once | INTRAMUSCULAR | Status: AC
  Administered 2024-06-08: 1000 ug via INTRAMUSCULAR
  Filled 2024-06-08: qty 1

## 2024-06-08 NOTE — Progress Notes (Signed)
 Pt continues with confusion today. He is impulsive, pulling at lines and attempting to get OOB. He is unable to be redirected, despite continuous attempts throughout the day by staff and family. Restraint order obtained for waistbelt and bilateral soft restraints. Necessity for restraints explained to family at bedside. They agree with safety plan. Restraints applied per order for pt safety.

## 2024-06-08 NOTE — Progress Notes (Signed)
 PT Cancellation Note  Patient Details Name: Ronald Lowe MRN: [id number] DOB: 16-Jul-1956   Cancelled Treatment:    Reason Eval/Treat Not Completed: (P) Medical issues which prohibited therapy (RN reports they are about to give pt haldol  and attempt to gain IV access. Will follow up as able.)   Darryle George 06/08/2024, 1:01 PM

## 2024-06-08 NOTE — Evaluation (Signed)
 SLP Cancellation Note  Patient Details Name: Ronald Lowe MRN: [id number] DOB: Nov 28, 1956   Cancelled treatment:       Reason Eval/Treat Not Completed: Other (comment) (per chart review,pt is about to get Haldol ; and obtain IV access; will continue efforts)  Madelin POUR, MS [facility] SLP Acute Rehab Services Office [phone]   Nicolas Emmie Caldron 06/08/2024, 1:09 PM

## 2024-06-08 NOTE — Progress Notes (Signed)
 Inpatient Rehab Admissions Coordinator:    CIR following, but Pt. Currently in withdrawals, unclear if he will progress to being an appropriate candidate for CIR.  I did speak with wife who states she is home with him 24/7 and that family will likely want CIR when Pt. Snaps out of it and can participate more.  Leita Kleine, MS, CCC-SLP Rehab Admissions Coordinator  [phone] (celll) [phone] (office)

## 2024-06-08 NOTE — Plan of Care (Signed)

## 2024-06-08 NOTE — Progress Notes (Signed)
 PROGRESS NOTE    [name]  FMW:[id number] DOB: March 03, 1956 DOA: 06/06/2024 PCP: Tanda Bleacher, MD    Brief Narrative:   Ronald Lowe is a 68 y.o. male with past medical history significant for HTN, HLD, paroxysmal atrial fibrillation noncompliant with anticoagulation, GERD, tobacco use disorder, CKD3a, COPD, EtOH use disorder who presented to [facility] ED via EMS after family found him sitting on the floor confused with right arm weakness.  LKN was 06/06/24 at 7:30 AM when he left for work.  History apparently obtained from review of chart and family as he is unable to precipitate Foley.  Family notes that he has been having frequent episodes of confusion and they have been concerned about dementia versus the confusion being related to his heavy drinking.  Spouse reports 4-5 vodka drinks daily.  In the ED, temperature 99.6 F, BP 205/95, HR 109, RR 22, SpO2 99% on room air.  WBC 7.5, hemoglobin 17.0, platelet count 209.  Sodium 138, potassium 3.7, chloride 103, CO2 23, glucose 98, BUN 16, creatinine 1.53.  AST 37, ALT 22, total bilirubin 2.0. CT head obtained and shows hypoattenuation in the left anterior insular region concerning for acute infarct and chronic microvascular ischemic disease.  MRI obtained for better characterization of CT head findings.  MRI showed occluded proximal left M3 MCA, nearly occlusive stenosis of the left carotid bifurcation, 55% stenosis of the right ICA, and right V2 vertebral stenosis secondary to compression from adjacent osteophyte.  CXR obtained and negative for acute abnormalities.  He was given Ativan  in the ED.  Neurology consulted as part of code stroke and [facility] later contacted for admission.  Assessment & Plan:   Acute multifocal CVA Patient presenting after being found altered, with right-sided arm weakness, aphasia with last known normal 7:30 AM on 06/06/2024.  Complicated by his heavy alcohol abuse, tobacco use and noncompliance with anticoagulation.  MR brain  without contrast with multiple acute infarcts left MCA, left PCA, left basal ganglia, left posterior limb of the internal capsule, left anterior insula, overlying left frontal and parietal lobes of the left occipital lobe without mass effect.  CT angiogram head/neck with occluded proximal left M3 MCA, nearly occlusive stenosis left carotid bifurcation, 55% stenosis right ICA, moderate V2 vertebral artery stenosis due to compression from adjacent osteophyte.  UDS positive for THC, cocaine, benzodiazepines, amphetamines.  Hemoglobin A1c 4.2.  LDL 66.  Carotid ultrasound with 60 to 79% stenosis left ICA. -- Admit inpatient, telemetry -- Neurology following, appreciate assistance -- TTE: Pending -- Seen by SLP> dysphagia 2 diet, assist with meals; aspiration precautions -- PT/OT evaluation: Recommend CIR admission -- Rehab MD consult: Pending -- Allow permissive hypertension, goal BP <220/120 -- Aspirin  daily  Left ICA stenosis Carotid ultrasound with 60 to 79% stenosis left ICA. -- Consideration for revascularization per neurology  EtOH use disorder with withdrawal CIWA scores ranging 12-14 past 24 hours.  Spouse endorses patient drinks 4-5 vodka drinks daily. -- CIWA protocol with as needed Ativan  -- Thiamine , folate, multivitamin  Acute kidney injury on chronic kidney disease: Resolved Unclear baseline creatinine. Notes from 2022 and 2023 noted baseline creatinine of 1.9 however most recent readings from 2022 and 2023 in [facility] show creatinine of 0.94-1.2. -- Cr 1.53>1.07 -- IVF with LR at 75 mL/h -- BMP in the a.m.  Right shoulder/forearm pain Patient grimaces with flexion/extension at (R) elbow and palpation of forearm.  X-ray of right forearm/shoulder negative for fracture or dislocation.   Hypertension -- Allow permissive  hypertension with goal BP <220/120 -- Labetalol  10 mg IV q2h PRN SBP > 220, DBP > 120    Paroxysmal Atrial Fibrillation Noncompliant with outpatient anticoagulation  and Coreg    Hyperlipidemia Noncompliant with outpatient statin.  Lipid panel with total cholesterol 141, HDL 59, LDL 66, triglycerides 79. -- Restart when able to take p.o. meds   COPD -- Breztri  inhaler 2 puffs twice daily -- Albuterol  neb every 4 hours.  Wheezing/shortness of breath  GERD -- Protonix  40 mg IV every 24 hours   Tobacco Use Disorder -- Nicotine  patch   Illicit substance abuse UDS positive for THC, cocaine, benzodiazepines, and feta means.     DVT prophylaxis: enoxaparin  (LOVENOX ) injection 40 mg Start: 06/07/24 1800    Code Status: Full Code Family Communication: Updated spouse present at bedside this morning  Disposition Plan:  Level of care: Telemetry Medical Status is: Inpatient Remains inpatient appropriate because:     Consultants:  Neurology  Procedures:  Carotid ultrasound TTE: Pending  Antimicrobials:  None   Subjective: Patient seen examined bedside, lying in bed.  Spouse present.  Patient's somnolent, received Ativan  for high CIWA scores overnight.  Discussed with spouse regarding his stroke, ICA stenosis as well as illicit substances found on UDS.  Spouse unaware of his use of cocaine and other illicits but does endorse that he drinks 4-5 vodka drinks daily.  Unable to obtain any further ROS due to his current mental status.  If mental status does not improve for the next 24-48 hours, may need to consider cortrak placement and initiation of tube feeds.  No acute concerns overnight per nursing staff.  Objective: Vitals:   06/07/24 1643 06/07/24 1816 06/08/24 0805 06/08/24 0947  BP:  (!) 160/82 (!) 159/100 (!) 164/141  Pulse: (!) 106 (!) 112 (!) 101 100  Resp: 16 20    Temp: (!) 97.1 F (36.2 C) 98 F (36.7 C)    TempSrc: Axillary Axillary Oral   SpO2:  99% 100%   Weight:      Height:       No intake or output data in the 24 hours ending 06/08/24 1052 Filed Weights   06/06/24 2200 06/06/24 2236  Weight: 68.8 kg 68.8 kg     Examination:  Physical Exam: GEN: NAD, somnolent, chronically ill in appearance, appears older than stated age HEENT: NCAT, PERRL, EOMI, sclera clear, dry mucous membranes PULM: CTAB w/o wheezes/crackles, normal respiratory effort, on room air CV: RRR w/o M/G/R GI: abd soft, NTND, + BS MSK: no peripheral edema NEURO: Unable to assess due to somnolence receiving IV Ativan  Integumentary: No concerning rashes/lesions/wounds noted to exposed skin surfaces    Data Reviewed: I have personally reviewed following labs and imaging studies  CBC: Recent Labs  Lab 06/06/24 2212 06/07/24 0836 06/08/24 0653  WBC 7.5 10.0 9.0  NEUTROABS 5.9  --   --   HGB 17.0  17.0 17.1* 16.8  HCT 47.6  50.0 48.2 46.4  MCV 104.2* 105.0* 103.3*  PLT 209 206 178   Basic Metabolic Panel: Recent Labs  Lab 06/06/24 2212 06/07/24 0836 06/08/24 0653  NA 138  138 139 139  K 3.7  4.2 3.7 3.5  CL 103  105 104 106  CO2 23 21* 15*  GLUCOSE 98  93 80 75  BUN 16  21 15 14   CREATININE 1.53*  1.50* 1.19 1.07  CALCIUM  9.5 9.4 8.9  MG  --   --  1.5*   GFR: Estimated Creatinine Clearance:  63.4 mL/min (by C-G formula based on SCr of 1.07 mg/dL). Liver Function Tests: Recent Labs  Lab 06/06/24 2212 06/08/24 0653  AST 37 40  ALT 22 20  ALKPHOS 58 54  BILITOT 2.0* 2.2*  PROT 7.1 6.4*  ALBUMIN 4.3 3.7   No results for input(s): LIPASE, AMYLASE in the last 168 hours. No results for input(s): AMMONIA in the last 168 hours. Coagulation Profile: Recent Labs  Lab 06/06/24 2212  INR 1.0   Cardiac Enzymes: No results for input(s): CKTOTAL, CKMB, CKMBINDEX, TROPONINI in the last 168 hours. BNP (last 3 results) No results for input(s): PROBNP in the last 8760 hours. HbA1C: Recent Labs    06/07/24 0836  HGBA1C 4.2*   CBG: Recent Labs  Lab 06/06/24 2207  GLUCAP 95   Lipid Profile: Recent Labs    06/07/24 0836  CHOL 141  HDL 59  LDLCALC 66  TRIG 79  CHOLHDL 2.4    Thyroid  Function Tests: No results for input(s): TSH, T4TOTAL, FREET4, T3FREE, THYROIDAB in the last 72 hours. Anemia Panel: Recent Labs    06/07/24 0836  VITAMINB12 180  FOLATE 7.2   Sepsis Labs: No results for input(s): PROCALCITON, LATICACIDVEN in the last 168 hours.  No results found for this or any previous visit (from the past 240 hours).       Radiology Studies: ECHOCARDIOGRAM COMPLETE Result Date: 06/07/2024    ECHOCARDIOGRAM REPORT   Patient Name:   DEJION GRILLO Date of Exam: 06/07/2024 Medical Rec #:  [id number]      Height:       72.0 in Accession #:    [id number]     Weight:       151.7 lb Date of Birth:  1956/01/18      BSA:          1.894 m Patient Age:    67 years       BP:           11/11 mmHg Patient Gender: M              HR:           1 bpm. Exam Location:  Inpatient Procedure: 2D Echo (Both Spectral and Color Flow Doppler were utilized during            procedure). Indications:    stroke  History:        Patient has no prior history of Echocardiogram examinations.  Sonographer:    Koleen Popper Referring Phys: [id number] SRISHTI L BHAGAT IMPRESSIONS  1. Echo not perfomred due to patient non compliance.  2. Left ventricular ejection fraction, by estimation, is Not done%. The left ventricle has not done function. Left ventricular endocardial border not optimally defined to evaluate regional wall motion. The left ventricular internal cavity size was not done. Left ventricular diastolic function could not be evaluated.  3. Right ventricular systolic function was not well visualized. The right ventricular size is not well visualized.  4. The mitral valve was not assessed. No evidence of mitral valve regurgitation.  5. The aortic valve was not assessed. Aortic valve regurgitation is not visualized.  6. Aortic Not imaged. FINDINGS  Left Ventricle: Left ventricular ejection fraction, by estimation, is Not done%. The left ventricle has not done function. Left  ventricular endocardial border not optimally defined to evaluate regional wall motion. Strain was performed and the global longitudinal strain is indeterminate. The left ventricular internal cavity size was not done. Suboptimal image quality limits for  assessment of left ventricular hypertrophy. Left ventricular diastolic function could not be evaluated. Right Ventricle: The right ventricular size is not well visualized. Right vetricular wall thickness was not assessed. Right ventricular systolic function was not well visualized. Left Atrium: Left atrial size was not assessed. Right Atrium: Right atrial size was not assessed. Pericardium: The pericardium was not assessed. Mitral Valve: The mitral valve was not assessed. No evidence of mitral valve regurgitation. Tricuspid Valve: The tricuspid valve is not assessed. Tricuspid valve regurgitation is not demonstrated. Aortic Valve: The aortic valve was not assessed. Aortic valve regurgitation is not visualized. Pulmonic Valve: The pulmonic valve was not assessed. Pulmonic valve regurgitation is not visualized. Aorta: Not imaged and the ascending aorta was not well visualized. IAS/Shunts: The interatrial septum was not assessed. Additional Comments: Echo not perfomred due to patient non compliance. 3D was performed not requiring image post processing on an independent workstation and was indeterminate. Maude Emmer MD Electronically signed by Maude Emmer MD Signature Date/Time: 06/07/2024/3:38:19 PM    Final    DG Shoulder Right Result Date: 06/07/2024 CLINICAL DATA:  Right shoulder pain EXAM: RIGHT SHOULDER - 3 VIEW COMPARISON:  None Available. FINDINGS: There is no evidence of fracture or dislocation. Mild degenerative changes of the glenohumeral joint. Soft tissues are unremarkable. IMPRESSION: No acute fracture or dislocation. Electronically Signed   By: Limin  Xu M.D.   On: 06/07/2024 14:45   VAS US  CAROTID Result Date: 06/07/2024 Carotid Arterial Duplex  Study Patient Name:  MAHLON GABRIELLE  Date of Exam:   06/07/2024 Medical Rec #: [id number]       Accession #:    [id number] Date of Birth: 1956-05-30       Patient Gender: M Patient Age:   1 years Exam Location:  [facility] Procedure:      VAS US  CAROTID Referring Phys: PRAMOD SETHI --------------------------------------------------------------------------------  Indications:       Carotid artery disease. Risk Factors:      Hypertension, hyperlipidemia, current smoker, coronary artery                    disease, PAD. Other Factors:     GERD, COPD, EtOH, CKD. Comparison Study:  CT Angio neck - Right ICA approximately 55% stenosis.                    Left ICA, nearly occlusive stenosis at the left carotid                    bifurcation with bulky calcific artherosclerosis. Performing Technologist: Ricka Sturdivant-Jones RDMS, RVT  Examination Guidelines: A complete evaluation includes B-mode imaging, spectral Doppler, color Doppler, and power Doppler as needed of all accessible portions of each vessel. Bilateral testing is considered an integral part of a complete examination. Limited examinations for reoccurring indications may be performed as noted.  Right Carotid Findings: +----------+--------+--------+--------+------------------+---------+           PSV cm/sEDV cm/sStenosisPlaque DescriptionComments  +----------+--------+--------+--------+------------------+---------+ CCA Prox  78      18                                          +----------+--------+--------+--------+------------------+---------+ CCA Distal79      22                                          +----------+--------+--------+--------+------------------+---------+  ICA Prox  111     38      1-39%   calcific          Shadowing +----------+--------+--------+--------+------------------+---------+ ICA Mid   102     22                                           +----------+--------+--------+--------+------------------+---------+ ICA Distal78      30                                          +----------+--------+--------+--------+------------------+---------+ ECA       137     26                                          +----------+--------+--------+--------+------------------+---------+ +----------+--------+-------+----------------+-------------------+           PSV cm/sEDV cmsDescribe        Arm Pressure (mmHG) +----------+--------+-------+----------------+-------------------+ Subclavian120            Multiphasic, WNL                    +----------+--------+-------+----------------+-------------------+ +---------+--------+--+--------+--+---------+ VertebralPSV cm/s57EDV cm/s15Antegrade +---------+--------+--+--------+--+---------+  Left Carotid Findings: +----------+--------+--------+--------+------------------+---------+           PSV cm/sEDV cm/sStenosisPlaque DescriptionComments  +----------+--------+--------+--------+------------------+---------+ CCA Prox  61      13                                          +----------+--------+--------+--------+------------------+---------+ CCA Distal44      11                                          +----------+--------+--------+--------+------------------+---------+ ICA Prox  311     90      60-79%  calcific          Shadowing +----------+--------+--------+--------+------------------+---------+ ICA Mid   49      18                                          +----------+--------+--------+--------+------------------+---------+ ICA Distal49      18                                          +----------+--------+--------+--------+------------------+---------+ ECA       43      13                                          +----------+--------+--------+--------+------------------+---------+ +----------+--------+--------+----------------+-------------------+            PSV cm/sEDV cm/sDescribe        Arm Pressure (mmHG) +----------+--------+--------+----------------+-------------------+ [id number]  Multiphasic, WNL                    +----------+--------+--------+----------------+-------------------+ +---------+--------+--+--------+--+---------+ VertebralPSV cm/s85EDV cm/s19Antegrade +---------+--------+--+--------+--+---------+   Summary: Right Carotid: Velocities in the right ICA are consistent with a 1-39% stenosis.                ICA velocities suggest upper end of scale. May be underestimated                due to heavey calcified plaque shadowing. Left Carotid: Velocities in the left ICA are consistent with a 60-79% stenosis.               ICA velocities suggest upper end of scale. May be underestimated               due to heavy calcified plaque shadowing.  *See table(s) above for measurements and observations.     Preliminary    DG Forearm Right Result Date: 06/07/2024 CLINICAL DATA:  144615 Pain 144615 EXAM: RIGHT FOREARM - 2 VIEW COMPARISON:  None Available. FINDINGS: No acute fracture or dislocation. There is no evidence of arthropathy or other focal bone abnormality. Soft tissues are unremarkable. IMPRESSION: No acute fracture or dislocation. Electronically Signed   By: Rogelia Myers M.D.   On: 06/07/2024 10:57   DG Chest Portable 1 View Result Date: 06/07/2024 EXAM: 1 VIEW XRAY OF THE CHEST 06/07/2024 01:01:32 AM COMPARISON: CT chest dated 01/29/2024. CLINICAL HISTORY: Coughing. FINDINGS: LUNGS AND PLEURA: No focal pulmonary opacity. No pulmonary edema. No pleural effusion. No pneumothorax. HEART AND MEDIASTINUM: No acute abnormality of the cardiac and mediastinal silhouettes. Thoracic aortic atherosclerosis. BONES AND SOFT TISSUES: No acute osseous abnormality. IMPRESSION: 1. No acute findings. Electronically signed by: Pinkie Pebbles MD 06/07/2024 01:04 AM EDT RP Workstation: [id number]   MR BRAIN WO CONTRAST Result  Date: 06/06/2024 CLINICAL DATA:  Neuro deficit, acute, stroke suspected EXAM: MRI HEAD WITHOUT CONTRAST TECHNIQUE: Multiplanar, multiecho pulse sequences of the brain and surrounding structures were obtained without intravenous contrast. COMPARISON:  Same day CTA head/neck and CT head. FINDINGS: Brain: Multiple acute infarcts in the left MCA and left PCA territories, including the left basal ganglia, left posterior limb of the internal capsule, left anterior insula, overlying left frontal and parietal lobes and the left occipital lobe. Mild associated edema in the anterior insula. No mass effect. No midline shift. Additional moderate patchy T2/FLAIR hyperintensities, compatible with chronic microvascular ischemic disease. No mass lesion or hydrocephalus. No extra-axial fluid collections. Vascular: Better evaluated on same day CTA. Skull and upper cervical spine: Normal marrow signal. Sinuses/Orbits: Clear sinuses.  No acute orbital findings. Other: No mastoid effusions. IMPRESSION: Multiple acute infarcts in the left MCA and left PCA territories, including the left basal ganglia, left posterior limb of the internal capsule, left anterior insula, overlying left frontal and parietal lobes and the left occipital lobe. No mass effect. Electronically Signed   By: Gilmore GORMAN Molt M.D.   On: 06/06/2024 23:29   CT ANGIO HEAD NECK W WO CM W PERF (CODE STROKE) Result Date: 06/06/2024 CLINICAL DATA:  Neuro deficit, acute, stroke suspected EXAM: CT ANGIOGRAPHY HEAD AND NECK CT PERFUSION BRAIN TECHNIQUE: Multidetector CT imaging of the head and neck was performed using the standard protocol during bolus administration of intravenous contrast. Multiplanar CT image reconstructions and MIPs were obtained to evaluate the vascular anatomy. Carotid stenosis measurements (when applicable) are obtained utilizing NASCET criteria, using the distal internal carotid diameter as the denominator. Multiphase CT  imaging of the brain was  performed following IV bolus contrast injection. Subsequent parametric perfusion maps were calculated using RAPID software. RADIATION DOSE REDUCTION: This exam was performed according to the departmental dose-optimization program which includes automated exposure control, adjustment of the mA and/or kV according to patient size and/or use of iterative reconstruction technique. CONTRAST:  OMNIPAQUE  IOHEXOL  350 MG/ML SOLN COMPARISON:  Same day CT head. FINDINGS: CTA NECK FINDINGS Aortic arch: Aortic atherosclerosis. Great vessel origins are patent. Right carotid system: Atherosclerosis at the carotid bifurcation and involving the proximal ICA with approximately 55% stenosis. Left carotid system: Critical, nearly occlusive stenosis at the left carotid bifurcation. The degree of stenosis is difficult to quantify given the degree of bulky calcific atherosclerosis but likely at least 80%. More distal ICA is patent but diminutive. Vertebral arteries: Right dominant. Mild bilateral vertebral artery origin stenosis. Moderate right V2 vertebral artery stenosis due to compression from adjacent osteophyte. Skeleton: Severe multilevel degenerative change in the cervical spine. No acute abnormality on limited assessment. Other neck: No acute abnormality on limited assessment. Upper chest: Emphysema.  Visualized lung apices are clear. Review of the MIP images confirms the above findings CTA HEAD FINDINGS Anterior circulation: Bilateral intracranial ICAs are patent without hemodynamically significant stenosis. Bilateral M1 MCAs and the ACAs are patent. Hypoplastic left A1 ACA. Occluded proximal left M3 MCA just above the anterior sylvian fissure (for example see series 8, images 112 through 114). Posterior circulation: Bilateral intradural vertebral arteries, basilar artery and bilateral posterior cerebral arteries are patent without proximal hemodynamically significant stenosis. Venous sinuses: As permitted by contrast  timing, patent. Review of the MIP images confirms the above findings CT Brain Perfusion Findings: ASPECTS: 9 CBF (<30%) Volume: 0mL Perfusion (Tmax>6.0s) volume: Mismatch Volume: spanning multiple vascular territories bilaterally both infratentorial and supratentorial and therefore likely largely artifactual. Infarction Location:No core infarct identified. IMPRESSION: CTA: 1. Occluded proximal left M3 MCA. 2. Critical, nearly occlusive stenosis at the left carotid bifurcation. The degree of stenosis is difficult to quantify given the degree of bulky calcific atherosclerosis but likely at least 80%. 3. Approximately 55% stenosis of the right ICA. 4. Moderate right V2 vertebral artery stenosis due to compression from adjacent osteophyte. CTP: Nondiagnostic perfusion. Findings discussed with Dr. Jerrie via telephone at 10:30 p.m. Electronically Signed   By: Gilmore GORMAN Molt M.D.   On: 06/06/2024 22:48   CT HEAD CODE STROKE WO CONTRAST Result Date: 06/06/2024 CLINICAL DATA:  Code stroke.  Neuro deficit, acute, stroke suspected EXAM: CT HEAD WITHOUT CONTRAST TECHNIQUE: Contiguous axial images were obtained from the base of the skull through the vertex without intravenous contrast. RADIATION DOSE REDUCTION: This exam was performed according to the departmental dose-optimization program which includes automated exposure control, adjustment of the mA and/or kV according to patient size and/or use of iterative reconstruction technique. COMPARISON:  None Available. FINDINGS: Brain: No evidence of acute infarction, hemorrhage, hydrocephalus, extra-axial collection or mass lesion/mass effect. Moderate patchy white matter hypodensities, compatible with chronic microvascular ischemic disease. Vascular: No hyperdense vessel identified. Skull: Normal. Negative for fracture or focal lesion. Sinuses/Orbits: Clear sinuses.  No acute orbital findings. Other: No mastoid effusions. ASPECTS [facility] Stroke Program Early CT  Score) Total score (0-10 with 10 being normal): 9. IMPRESSION: 1. Subtle hypoattenuation in the anterior left insula, concerning for acute infarct. ASPECTS 9. 2. Moderate chronic microvascular ischemic disease. Findings discussed with Dr. Jerrie via telephone at 10:30 p.m. Electronically Signed   By: Gilmore GORMAN Molt M.D.   On:  06/06/2024 22:37        Scheduled Meds:  aspirin   300 mg Rectal Daily   Or   aspirin   325 mg Oral Daily   budesonide -glycopyrrolate -formoterol   2 puff Inhalation BID   clopidogrel   75 mg Oral Daily   enoxaparin  (LOVENOX ) injection  40 mg Subcutaneous Q24H   folic acid   1 mg Oral Daily   LORazepam   1 mg Intravenous Once   multivitamin with minerals  1 tablet Oral Daily   nicotine   14 mg Transdermal Daily   pantoprazole  (PROTONIX ) IV  40 mg Intravenous Q24H   [START ON 06/15/2024] thiamine  (VITAMIN B1) injection  100 mg Intravenous Daily   Continuous Infusions:  thiamine  (VITAMIN B1) injection 500 mg (06/08/24 0951)   Followed by   NOREEN ON 06/09/2024] thiamine  (VITAMIN B1) injection       LOS: 1 day    Time spent: 54 minutes spent on 06/08/2024 caring for this patient face-to-face including chart review, ordering labs/tests, documenting, discussion with nursing staff, consultants, updating family and interview/physical exam    Camellia PARAS [place], DO [facility] Available via Epic secure chat 7am-7pm After these hours, please refer to coverage provider listed on [email or web address] 06/08/2024, 10:52 AM

## 2024-06-08 NOTE — Progress Notes (Addendum)
 Patient is STROKE TEAM PROGRESS NOTE    INTERIM HISTORY/SUBJECTIVE  On exam, patient having symptoms of withdrawal with intermittent agitation and uncooperativeness. Patient receiving Ativan  as part of CIWA protocol and was just given 2mg  of Haldol . Therefore, neurological exam is very limited.   Pending retry for ECHO.    OBJECTIVE  CBC    Component Value Date/Time   WBC 9.0 06/08/2024 0653   RBC 4.49 06/08/2024 0653   HGB 16.8 06/08/2024 0653   HGB 15.8 07/01/2022 1039   HCT 46.4 06/08/2024 0653   HCT 45.1 07/01/2022 1039   PLT 178 06/08/2024 0653   PLT 280 07/01/2022 1039   MCV 103.3 (H) 06/08/2024 0653   MCV 103 (H) 07/01/2022 1039   MCH 37.4 (H) 06/08/2024 0653   MCHC 36.2 (H) 06/08/2024 0653   RDW 13.9 06/08/2024 0653   RDW 13.7 07/01/2022 1039   LYMPHSABS 0.8 06/06/2024 2212   LYMPHSABS 0.8 07/01/2022 1039   MONOABS 0.7 06/06/2024 2212   EOSABS 0.0 06/06/2024 2212   EOSABS 0.2 07/01/2022 1039   BASOSABS 0.1 06/06/2024 2212   BASOSABS 0.1 07/01/2022 1039    BMET    Component Value Date/Time   NA 139 06/08/2024 0653   NA 133 (L) 07/01/2022 1039   K 3.5 06/08/2024 0653   CL 106 06/08/2024 0653   CO2 15 (L) 06/08/2024 0653   GLUCOSE 75 06/08/2024 0653   BUN 14 06/08/2024 0653   BUN 7 (L) 07/01/2022 1039   CREATININE 1.07 06/08/2024 0653   CALCIUM  8.9 06/08/2024 0653   EGFR 89 07/01/2022 1039   GFRNONAA >60 06/08/2024 0653    IMAGING past 24 hours ECHOCARDIOGRAM COMPLETE Result Date: 06/07/2024    ECHOCARDIOGRAM REPORT   Patient Name:   VICTORINO FATZINGER Date of Exam: 06/07/2024 Medical Rec #:  [id number]      Height:       72.0 in Accession #:    [id number]     Weight:       151.7 lb Date of Birth:  04/20/1956      BSA:          1.894 m Patient Age:    68 years       BP:           11/11 mmHg Patient Gender: M              HR:           1 bpm. Exam Location:  Inpatient Procedure: 2D Echo (Both Spectral and Color Flow Doppler were utilized during             procedure). Indications:    stroke  History:        Patient has no prior history of Echocardiogram examinations.  Sonographer:    Koleen Popper Referring Phys: [id number] SRISHTI L BHAGAT IMPRESSIONS  1. Echo not perfomred due to patient non compliance.  2. Left ventricular ejection fraction, by estimation, is Not done%. The left ventricle has not done function. Left ventricular endocardial border not optimally defined to evaluate regional wall motion. The left ventricular internal cavity size was not done. Left ventricular diastolic function could not be evaluated.  3. Right ventricular systolic function was not well visualized. The right ventricular size is not well visualized.  4. The mitral valve was not assessed. No evidence of mitral valve regurgitation.  5. The aortic valve was not assessed. Aortic valve regurgitation is not visualized.  6. Aortic Not imaged. FINDINGS  Left Ventricle:  Left ventricular ejection fraction, by estimation, is Not done%. The left ventricle has not done function. Left ventricular endocardial border not optimally defined to evaluate regional wall motion. Strain was performed and the global longitudinal strain is indeterminate. The left ventricular internal cavity size was not done. Suboptimal image quality limits for assessment of left ventricular hypertrophy. Left ventricular diastolic function could not be evaluated. Right Ventricle: The right ventricular size is not well visualized. Right vetricular wall thickness was not assessed. Right ventricular systolic function was not well visualized. Left Atrium: Left atrial size was not assessed. Right Atrium: Right atrial size was not assessed. Pericardium: The pericardium was not assessed. Mitral Valve: The mitral valve was not assessed. No evidence of mitral valve regurgitation. Tricuspid Valve: The tricuspid valve is not assessed. Tricuspid valve regurgitation is not demonstrated. Aortic Valve: The aortic valve was not assessed. Aortic  valve regurgitation is not visualized. Pulmonic Valve: The pulmonic valve was not assessed. Pulmonic valve regurgitation is not visualized. Aorta: Not imaged and the ascending aorta was not well visualized. IAS/Shunts: The interatrial septum was not assessed. Additional Comments: Echo not perfomred due to patient non compliance. 3D was performed not requiring image post processing on an independent workstation and was indeterminate. Maude Emmer MD Electronically signed by Maude Emmer MD Signature Date/Time: 06/07/2024/3:38:19 PM    Final    VAS US  CAROTID Result Date: 06/07/2024 Carotid Arterial Duplex Study Patient Name:  CASSELL VOORHIES  Date of Exam:   06/07/2024 Medical Rec #: [id number]       Accession #:    [id number] Date of Birth: [birth year]       Patient Gender: M Patient Age:   68 years Exam Location:  [facility] Procedure:      VAS US  CAROTID Referring Phys: PRAMOD SETHI --------------------------------------------------------------------------------  Indications:       Carotid artery disease. Risk Factors:      Hypertension, hyperlipidemia, current smoker, coronary artery                    disease, PAD. Other Factors:     GERD, COPD, EtOH, CKD. Comparison Study:  CT Angio neck - Right ICA approximately 55% stenosis.                    Left ICA, nearly occlusive stenosis at the left carotid                    bifurcation with bulky calcific artherosclerosis. Performing Technologist: Ricka Sturdivant-Jones RDMS, RVT  Examination Guidelines: A complete evaluation includes B-mode imaging, spectral Doppler, color Doppler, and power Doppler as needed of all accessible portions of each vessel. Bilateral testing is considered an integral part of a complete examination. Limited examinations for reoccurring indications may be performed as noted.  Right Carotid Findings: +----------+--------+--------+--------+------------------+---------+           PSV cm/sEDV cm/sStenosisPlaque DescriptionComments   +----------+--------+--------+--------+------------------+---------+ CCA Prox  78      18                                          +----------+--------+--------+--------+------------------+---------+ CCA Distal79      22                                          +----------+--------+--------+--------+------------------+---------+  ICA Prox  111     38      1-39%   calcific          Shadowing +----------+--------+--------+--------+------------------+---------+ ICA Mid   102     22                                          +----------+--------+--------+--------+------------------+---------+ ICA Distal78      30                                          +----------+--------+--------+--------+------------------+---------+ ECA       137     26                                          +----------+--------+--------+--------+------------------+---------+ +----------+--------+-------+----------------+-------------------+           PSV cm/sEDV cmsDescribe        Arm Pressure (mmHG) +----------+--------+-------+----------------+-------------------+ Subclavian120            Multiphasic, WNL                    +----------+--------+-------+----------------+-------------------+ +---------+--------+--+--------+--+---------+ VertebralPSV cm/s57EDV cm/s15Antegrade +---------+--------+--+--------+--+---------+  Left Carotid Findings: +----------+--------+--------+--------+------------------+---------+           PSV cm/sEDV cm/sStenosisPlaque DescriptionComments  +----------+--------+--------+--------+------------------+---------+ CCA Prox  61      13                                          +----------+--------+--------+--------+------------------+---------+ CCA Distal44      11                                          +----------+--------+--------+--------+------------------+---------+ ICA Prox  311     90      60-79%  calcific          Shadowing  +----------+--------+--------+--------+------------------+---------+ ICA Mid   49      18                                          +----------+--------+--------+--------+------------------+---------+ ICA Distal49      18                                          +----------+--------+--------+--------+------------------+---------+ ECA       43      13                                          +----------+--------+--------+--------+------------------+---------+ +----------+--------+--------+----------------+-------------------+           PSV cm/sEDV cm/sDescribe        Arm Pressure (mmHG) +----------+--------+--------+----------------+-------------------+ [id number]             Multiphasic,  WNL                    +----------+--------+--------+----------------+-------------------+ +---------+--------+--+--------+--+---------+ VertebralPSV cm/s85EDV cm/s19Antegrade +---------+--------+--+--------+--+---------+   Summary: Right Carotid: Velocities in the right ICA are consistent with a 1-39% stenosis.                ICA velocities suggest upper end of scale. May be underestimated                due to heavey calcified plaque shadowing. Left Carotid: Velocities in the left ICA are consistent with a 60-79% stenosis.               ICA velocities suggest upper end of scale. May be underestimated               due to heavy calcified plaque shadowing.  *See table(s) above for measurements and observations.     Preliminary     Vitals:   06/07/24 1643 06/07/24 1816 06/08/24 0805 06/08/24 0947  BP:  (!) 160/82 (!) 159/100 (!) 164/141  Pulse: (!) 106 (!) 112 (!) 101 100  Resp: 16 20    Temp: (!) 97.1 F (36.2 C) 98 F (36.7 C)    TempSrc: Axillary Axillary Oral   SpO2:  99% 100%   Weight:      Height:        PHYSICAL EXAM General: Alert, appears somewhat chronically ill CV: Regular rate and rhythm on monitor Respiratory:  Regular, unlabored respirations on room  air  NEURO:  Mental Status/Speech:Sedation effects of Ativan  and Haldol  present. Opens eyes to voice, follows commands (make fist, open/close eyes) Moderate dysarthria.  Rambling answers, poor attention/just received Haldol .   Cranial Nerves:  II: PERRL.  III, IV, VI: Left gaze preference, does not track with forced eye opening V: Sensation is intact to light touch and symmetrical to face.  VII: Right facial droop VIII: hearing intact to voice. Motor: Spontaneous anti-gravity movement.  Tone: is normal and bulk is normal Sensation- Withdraws in all extremities Coordination: uncooperative Gait- deferred  Most Recent NIH: 8   ASSESSMENT/PLAN  Mr. CALLIE BUNYARD is a 68 y.o. male with history of atrial fibrillation not on anticoagulation, hypertension, hyperlipidemia, alcohol abuse, ongoing smoking, cervical spine degenerative disc disease who was admitted for acute stroke, potentially cardioembolic from A-fib. NIH on Admission: 9.  Stroke:  left MCA scattered infarcts, etiology likely due to symptomatic proximal left ICA stenosis CT Head without contrast Subtle hypoattenuation in the anterior left insula  CT angio Head and Neck Occluded proximal left M3 MCA. Critical, nearly occlusive stenosis at the left carotid bifurcation, at least 80%. Approximately 55% stenosis of the right ICA. Moderate right V2 vertebral artery stenosis due to compression from adjacent osteophyte. MRI Brain Multiple acute infarcts in the left MCA territories, including the left basal ganglia, left posterior limb of the internal capsule, left anterior insula, overlying left frontal and parietal lobes and the left occipital lobe. No mass effect. Carotid Doppler L ICA 60-79% stenosis 2D Echo: pending LDL 66 HgbA1c 4.2 UDS + cocaine, amphetamine, THC, benzo VTE prophylaxis - lovenox  No antithrombotic prior to admission, now on DAPT with ASA 81mg  and Plavix .  Therapy recommendations:  CIR Disposition:   pending  Carotid stenosis CT angio Head and Neck critical, nearly occlusive stenosis at the left carotid bifurcation, at least 80%. Approximately 55% stenosis of the right ICA. Symptomatic this time On DAPT Pt not candidate for intervention now. Will discuss with IR  once pt more medically appropriate  Atrial fibrillation Home Meds: no OAC due to cost per family, noted on chart that patient does not like how it makes him feel.  Continue telemetry monitoring Now on DAPT  Not good candidate for [facility] due to alcohol and substance abuse, fall risk and noncompliance  Hypertension Home meds: Lisinopril  5 mg Stable Blood Pressure Goal: BP less than 220/110 permissive hypertension for first 24 hours after symptom onset then gradually normalize. Avoid hypotension due to intracranial stenosis seen  Hyperlipidemia Home meds:  none LDL 66, goal < 70 High intensity statin not indicated due to LDL within goal and no po access for now  Alcohol abuse B12 deficiency  Intermittent agitation  On CIWA protocol On B1/FA/MVI B12 = 180, supplement B12 1000mcg IM x 1   Tobacco Abuse Patient smokes 0.25 packs per day for 53 years, smokes marijuana Will provide cessation education once appropriate Nicotine  replacement therapy provided  Substance Abuse UDS positive for THC, cocaine, benzos, amphetamines Will provide cessation education once appropriate TOC consult for cessation placed  Dysphagia Patient has post-stroke dysphagia, SLP consulted Now on dys2 and thin liquid Given mental status with CIWA protocol, pt high risk of aspiration Advance diet as tolerated  Other Stroke Risk Factors Advanced age Family hx stroke (mother)  Other Active Problems History of medication noncompliance  Hospital day # 1   Pt seen by Neuro NP/APP with MD. Note/plan to be edited by MD as needed.    Rocky JAYSON Likes, DNP [facility] Please use AMION for contact information & EPIC for  messaging.  ATTENDING NOTE: I reviewed above note and agree with the assessment and plan. Pt was seen and examined.   Wife and sister in law are at the bedside. Pt obtunded, sonorus with Cheyne-stokes breathing pattern. Not open eyes on voice, not verbal or following commands. Eyes midline, pupils 2mm, sluggish to light, not tracking. No significant facial droop. Tongue protrusion not cooperative. On pain stimulation, withdraw BLEs 3/5, slight withdraw on the LUE but no significant movement of RUE. RUE also swollen. Sensation, coordination and gait not tested.  Pt on CIWA protocol with sedative effect, not cooperative with exam. Wife stated that pt had cognitive decline, likely due to substance abuse. Currently on DAPT, not candidate for [facility] or carotid intervention. Will need more time to see if improvement medically.   For detailed assessment and plan, please refer to above as I have made changes wherever appropriate.   Ary Cummins, MD PhD Stroke Neurology 06/08/2024 3:49 PM  I discussed with Dr. [place]. I spent extensive total face-to-face time with the patient wife, reviewing test results, images and medication, and discussing the diagnosis, treatment plan and potential prognosis. This patient's care requiresreview of multiple databases, neurological assessment, discussion with family, other specialists and medical decision making of high complexity.

## 2024-06-09 ENCOUNTER — Inpatient Hospital Stay (HOSPITAL_COMMUNITY)

## 2024-06-09 DIAGNOSIS — I69391 Dysphagia following cerebral infarction: Secondary | ICD-10-CM | POA: Diagnosis not present

## 2024-06-09 DIAGNOSIS — I6523 Occlusion and stenosis of bilateral carotid arteries: Secondary | ICD-10-CM | POA: Diagnosis not present

## 2024-06-09 DIAGNOSIS — I6389 Other cerebral infarction: Secondary | ICD-10-CM | POA: Diagnosis not present

## 2024-06-09 DIAGNOSIS — I4891 Unspecified atrial fibrillation: Secondary | ICD-10-CM | POA: Diagnosis not present

## 2024-06-09 DIAGNOSIS — M7989 Other specified soft tissue disorders: Secondary | ICD-10-CM

## 2024-06-09 DIAGNOSIS — I639 Cerebral infarction, unspecified: Secondary | ICD-10-CM | POA: Diagnosis not present

## 2024-06-09 DIAGNOSIS — I63512 Cerebral infarction due to unspecified occlusion or stenosis of left middle cerebral artery: Secondary | ICD-10-CM | POA: Diagnosis not present

## 2024-06-09 LAB — COMPREHENSIVE METABOLIC PANEL WITH GFR
ALT: 19 U/L (ref 0–44)
AST: 36 U/L (ref 15–41)
Albumin: 3.7 g/dL (ref 3.5–5.0)
Alkaline Phosphatase: 52 U/L (ref 38–126)
Anion gap: 14 (ref 5–15)
BUN: 12 mg/dL (ref 8–23)
CO2: 15 mmol/L — ABNORMAL LOW (ref 22–32)
Calcium: 8.6 mg/dL — ABNORMAL LOW (ref 8.9–10.3)
Chloride: 108 mmol/L (ref 98–111)
Creatinine, Ser: 1.04 mg/dL (ref 0.61–1.24)
GFR, Estimated: >60 mL/min (ref >60)
Glucose, Bld: 76 mg/dL (ref 70–99)
Potassium: 3.5 mmol/L (ref 3.5–5.1)
Sodium: 137 mmol/L (ref 135–145)
Total Bilirubin: 1.7 mg/dL — ABNORMAL HIGH (ref 0.0–1.2)
Total Protein: 6.4 g/dL — ABNORMAL LOW (ref 6.5–8.1)

## 2024-06-09 LAB — ECHOCARDIOGRAM COMPLETE
AV Peak grad: 8.8 mmHg
Ao pk vel: 1.48 m/s
Area-P 1/2: 2.5 cm2
Height: 72 in
S' Lateral: 2.1 cm
Weight: 2426.82 [oz_av]

## 2024-06-09 MED ORDER — ARFORMOTEROL TARTRATE 15 MCG/2ML IN NEBU
15.0000 ug | INHALATION_SOLUTION | Freq: Two times a day (BID) | RESPIRATORY_TRACT | Status: DC
  Administered 2024-06-09 – 2024-06-14 (×11): 15 ug via RESPIRATORY_TRACT
  Filled 2024-06-09 (×11): qty 2

## 2024-06-09 MED ORDER — LACTATED RINGERS IV SOLN
INTRAVENOUS | Status: AC
  Administered 2024-06-09: 75 mL via INTRAVENOUS

## 2024-06-09 MED ORDER — VITAMIN B-12 1000 MCG PO TABS
1000.0000 ug | ORAL_TABLET | Freq: Every day | ORAL | Status: DC
  Administered 2024-06-09 – 2024-06-14 (×6): 1000 ug via ORAL
  Filled 2024-06-09 (×6): qty 1

## 2024-06-09 MED ORDER — REVEFENACIN 175 MCG/3ML IN SOLN
175.0000 ug | Freq: Every day | RESPIRATORY_TRACT | Status: DC
  Administered 2024-06-09 – 2024-06-14 (×6): 175 ug via RESPIRATORY_TRACT
  Filled 2024-06-09 (×6): qty 3

## 2024-06-09 MED ORDER — BUDESONIDE 0.5 MG/2ML IN SUSP
0.5000 mg | Freq: Two times a day (BID) | RESPIRATORY_TRACT | Status: DC
  Administered 2024-06-09 – 2024-06-14 (×11): 0.5 mg via RESPIRATORY_TRACT
  Filled 2024-06-09 (×12): qty 2

## 2024-06-09 NOTE — Plan of Care (Signed)
  Problem: Education: Goal: Knowledge of disease or condition will improve Outcome: Not Progressing   

## 2024-06-09 NOTE — Progress Notes (Signed)
 Right upper extremity venous duplex has been completed. Preliminary results can be found in CV Proc through chart review.   06/09/24 1:21 PM Cathlyn Collet RVT

## 2024-06-09 NOTE — Progress Notes (Signed)
 SLP Cancellation Note  Patient Details Name: Ronald Lowe MRN: [id number] DOB: [birth year]   Cancelled treatment:       Reason Eval/Treat Not Completed: Fatigue/lethargy limiting ability to participate   Pat Angelisa Winthrop,M.S., CCC-SLP 06/09/2024, 10:37 AM

## 2024-06-09 NOTE — Plan of Care (Signed)
  Problem: Education: Goal: Knowledge of disease or condition will improve Outcome: Progressing Goal: Knowledge of secondary prevention will improve (MUST DOCUMENT ALL) Outcome: Progressing Goal: Knowledge of patient specific risk factors will improve (DELETE if not current risk factor) Outcome: Progressing   Problem: Ischemic Stroke/TIA Tissue Perfusion: Goal: Complications of ischemic stroke/TIA will be minimized Outcome: Progressing   Problem: Coping: Goal: Will verbalize positive feelings about self Outcome: Progressing Goal: Will identify appropriate support needs Outcome: Progressing   Problem: Health Behavior/Discharge Planning: Goal: Ability to manage health-related needs will improve Outcome: Progressing Goal: Goals will be collaboratively established with patient/family Outcome: Progressing   Problem: Self-Care: Goal: Ability to participate in self-care as condition permits will improve Outcome: Progressing Goal: Verbalization of feelings and concerns over difficulty with self-care will improve Outcome: Progressing Goal: Ability to communicate needs accurately will improve Outcome: Progressing   Problem: Nutrition: Goal: Risk of aspiration will decrease Outcome: Progressing Goal: Dietary intake will improve Outcome: Progressing   Problem: Education: Goal: Knowledge of General Education information will improve Description: Including pain rating scale, medication(s)/side effects and non-pharmacologic comfort measures Outcome: Progressing   Problem: Health Behavior/Discharge Planning: Goal: Ability to manage health-related needs will improve Outcome: Progressing   Problem: Clinical Measurements: Goal: Ability to maintain clinical measurements within normal limits will improve Outcome: Progressing Goal: Will remain free from infection Outcome: Progressing Goal: Diagnostic test results will improve Outcome: Progressing Goal: Respiratory complications will  improve Outcome: Progressing Goal: Cardiovascular complication will be avoided Outcome: Progressing   Problem: Activity: Goal: Risk for activity intolerance will decrease Outcome: Progressing   Problem: Nutrition: Goal: Adequate nutrition will be maintained Outcome: Progressing   Problem: Coping: Goal: Level of anxiety will decrease Outcome: Progressing   Problem: Elimination: Goal: Will not experience complications related to bowel motility Outcome: Progressing Goal: Will not experience complications related to urinary retention Outcome: Progressing   Problem: Pain Managment: Goal: General experience of comfort will improve and/or be controlled Outcome: Progressing   Problem: Safety: Goal: Ability to remain free from injury will improve Outcome: Progressing   Problem: Skin Integrity: Goal: Risk for impaired skin integrity will decrease Outcome: Progressing   Problem: Safety: Goal: Non-violent Restraint(s) Outcome: Progressing

## 2024-06-09 NOTE — Progress Notes (Signed)
 PROGRESS NOTE    Ronald Lowe  FMW:[id number] DOB: 1956-06-13 DOA: 06/06/2024 PCP: Tanda Bleacher, MD    Brief Narrative:   Ronald Lowe is a 68 y.o. male with past medical history significant for HTN, HLD, paroxysmal atrial fibrillation noncompliant with anticoagulation, GERD, tobacco use disorder, CKD3a, COPD, EtOH use disorder who presented to [facility] ED via EMS after family found him sitting on the floor confused with right arm weakness.  LKN was 06/06/24 at 7:30 AM when he left for work.  History apparently obtained from review of chart and family as he is unable to precipitate Foley.  Family notes that he has been having frequent episodes of confusion and they have been concerned about dementia versus the confusion being related to his heavy drinking.  Spouse reports 4-5 vodka drinks daily.  In the ED, temperature 99.6 F, BP 205/95, HR 109, RR 22, SpO2 99% on room air.  WBC 7.5, hemoglobin 17.0, platelet count 209.  Sodium 138, potassium 3.7, chloride 103, CO2 23, glucose 98, BUN 16, creatinine 1.53.  AST 37, ALT 22, total bilirubin 2.0. CT head obtained and shows hypoattenuation in the left anterior insular region concerning for acute infarct and chronic microvascular ischemic disease.  MRI obtained for better characterization of CT head findings.  MRI showed occluded proximal left M3 MCA, nearly occlusive stenosis of the left carotid bifurcation, 55% stenosis of the right ICA, and right V2 vertebral stenosis secondary to compression from adjacent osteophyte.  CXR obtained and negative for acute abnormalities.  He was given Ativan  in the ED.  Neurology consulted as part of code stroke and [facility] later contacted for admission.  Assessment & Plan:   Acute multifocal CVA Patient presenting after being found altered, with right-sided arm weakness, aphasia with last known normal 7:30 AM on 06/06/2024.  Complicated by his heavy alcohol abuse, tobacco use and noncompliance with anticoagulation.  MR brain  without contrast with multiple acute infarcts left MCA, left PCA, left basal ganglia, left posterior limb of the internal capsule, left anterior insula, overlying left frontal and parietal lobes of the left occipital lobe without mass effect.  CT angiogram head/neck with occluded proximal left M3 MCA, nearly occlusive stenosis left carotid bifurcation, 55% stenosis right ICA, moderate V2 vertebral artery stenosis due to compression from adjacent osteophyte.  UDS positive for THC, cocaine, benzodiazepines, amphetamines.  Hemoglobin A1c 4.2.  LDL 66.  Carotid ultrasound with 60 to 79% stenosis left ICA. -- Neurology following, appreciate assistance -- TTE: Pending -- Seen by SLP> dysphagia 2 diet, assist with meals; aspiration precautions -- PT/OT evaluation: Recommend CIR admission -- Allow permissive hypertension, goal BP <220/120 given intracranial stenosis -- DAPT with aspirin  81 mg p.o. daily, Plavix  75 mg p.o. daily  Left ICA stenosis Carotid ultrasound with 60 to 79% stenosis left ICA. -- Consideration for revascularization per neurology; currently not a candidate given his continued encephalopathy per neurology at this moment  EtOH use disorder with withdrawal Spouse endorses patient drinks 4-5 vodka drinks daily. -- CIWA protocol with as needed Ativan  -- Thiamine , folate, multivitamin  Acute kidney injury on chronic kidney disease: Resolved Unclear baseline creatinine. Notes from 2022 and 2023 noted baseline creatinine of 1.9 however most recent readings from 2022 and 2023 in [facility] show creatinine of 0.94-1.2. -- Cr 1.53>1.07 -- IVF with LR at 75 mL/h -- BMP in the a.m.  Right shoulder/forearm pain Patient grimaces with flexion/extension at (R) elbow and palpation of forearm.  X-ray of right forearm/shoulder negative for  fracture or dislocation.  Right upper from edema Etiology likely secondary to IV infiltration --Vascular duplex ultrasound RUE: Pending   Hypertension -- Allow  permissive hypertension with goal BP <220/120 -- Labetalol  10 mg IV q2h PRN SBP > 220, DBP > 120    Paroxysmal Atrial Fibrillation Noncompliant with outpatient anticoagulation and Coreg    Hyperlipidemia Noncompliant with outpatient statin.  Lipid panel with total cholesterol 141, HDL 59, LDL 66, triglycerides 79. -- Restart when able to take p.o. meds   COPD -- Breztri  inhaler 2 puffs twice daily -- Albuterol  neb every 4 hours.  Wheezing/shortness of breath  GERD -- Protonix  40 mg IV every 24 hours   Tobacco Use Disorder -- Nicotine  patch   Illicit substance abuse UDS positive for THC, cocaine, benzodiazepines, and feta means.     DVT prophylaxis: enoxaparin  (LOVENOX ) injection 40 mg Start: 06/07/24 1800    Code Status: Full Code Family Communication: Updated spouse present at bedside this morning  Disposition Plan:  Level of care: Telemetry Medical Status is: Inpatient Remains inpatient appropriate because: Continues to be encephalopathic, may need core track placement    Consultants:  Neurology  Procedures:  Carotid ultrasound TTE: Pending Vascular duplex ultrasound right upper extremity: Pending  Antimicrobials:  None   Subjective: Patient seen examined bedside, lying in bed.  No family present.  Somnolent, will arouse to command and opens eyes, states name otherwise remains confused, encephalopathic.  Unable to obtain any further ROS due to his current mental status.  If mental status does not improve for the next 24-48 hours, may need to consider cortrak placement and initiation of tube feeds.  No acute concerns overnight per nursing staff.  Objective: Vitals:   06/09/24 0409 06/09/24 0735 06/09/24 0808 06/09/24 1151  BP: 136/85 (!) 86/51 (!) 109/51 98/73  Pulse: 77 94 (!) 122 (!) 106  Resp: 18 18  18   Temp: 98.6 F (37 C) 98.1 F (36.7 C)  98.1 F (36.7 C)  TempSrc: Oral Oral  Oral  SpO2:  97%  99%  Weight:      Height:        Intake/Output  Summary (Last 24 hours) at 06/09/2024 1204 Last data filed at 06/09/2024 0300 Gross per 24 hour  Intake 571.23 ml  Output --  Net 571.23 ml   Filed Weights   06/06/24 2200 06/06/24 2236  Weight: 68.8 kg 68.8 kg    Examination:  Physical Exam: GEN: NAD, somnolent, chronically ill in appearance, appears older than stated age HEENT: NCAT, PERRL, EOMI, sclera clear, dry mucous membranes PULM: CTAB w/o wheezes/crackles, normal respiratory effort, on room air CV: RRR w/o M/G/R GI: abd soft, NTND, + BS MSK: no peripheral edema NEURO: Unable to assess due to somnolence receiving Haldol  due to agitation Integumentary: No concerning rashes/lesions/wounds noted to exposed skin surfaces    Data Reviewed: I have personally reviewed following labs and imaging studies  CBC: Recent Labs  Lab 06/06/24 2212 06/07/24 0836 06/08/24 0653  WBC 7.5 10.0 9.0  NEUTROABS 5.9  --   --   HGB 17.0  17.0 17.1* 16.8  HCT 47.6  50.0 48.2 46.4  MCV 104.2* 105.0* 103.3*  PLT 209 206 178   Basic Metabolic Panel: Recent Labs  Lab 06/06/24 2212 06/07/24 0836 06/08/24 0653 06/09/24 0521  NA 138  138 139 139 137  K 3.7  4.2 3.7 3.5 3.5  CL 103  105 104 106 108  CO2 23 21* 15* 15*  GLUCOSE 98  93  80 75 76  BUN 16  21 15 14 12   CREATININE 1.53*  1.50* 1.19 1.07 1.04  CALCIUM  9.5 9.4 8.9 8.6*  MG  --   --  1.5*  --    GFR: Estimated Creatinine Clearance: 65.2 mL/min (by C-G formula based on SCr of 1.04 mg/dL). Liver Function Tests: Recent Labs  Lab 06/06/24 2212 06/08/24 0653 06/09/24 0521  AST 37 40 36  ALT 22 20 19   ALKPHOS 58 54 52  BILITOT 2.0* 2.2* 1.7*  PROT 7.1 6.4* 6.4*  ALBUMIN 4.3 3.7 3.7   No results for input(s): LIPASE, AMYLASE in the last 168 hours. No results for input(s): AMMONIA in the last 168 hours. Coagulation Profile: Recent Labs  Lab 06/06/24 2212  INR 1.0   Cardiac Enzymes: No results for input(s): CKTOTAL, CKMB, CKMBINDEX, TROPONINI  in the last 168 hours. BNP (last 3 results) No results for input(s): PROBNP in the last 8760 hours. HbA1C: Recent Labs    06/07/24 0836  HGBA1C 4.2*   CBG: Recent Labs  Lab 06/06/24 2207  GLUCAP 95   Lipid Profile: Recent Labs    06/07/24 0836  CHOL 141  HDL 59  LDLCALC 66  TRIG 79  CHOLHDL 2.4   Thyroid  Function Tests: No results for input(s): TSH, T4TOTAL, FREET4, T3FREE, THYROIDAB in the last 72 hours. Anemia Panel: Recent Labs    06/07/24 0836  VITAMINB12 180  FOLATE 7.2   Sepsis Labs: No results for input(s): PROCALCITON, LATICACIDVEN in the last 168 hours.  No results found for this or any previous visit (from the past 240 hours).       Radiology Studies: ECHOCARDIOGRAM COMPLETE Result Date: 06/07/2024    ECHOCARDIOGRAM REPORT   Patient Name:   Ronald Lowe Date of Exam: 06/07/2024 Medical Rec #:  [id number]      Height:       72.0 in Accession #:    [id number]     Weight:       151.7 lb Date of Birth:  1956/12/10      BSA:          1.894 m Patient Age:    67 years       BP:           11/11 mmHg Patient Gender: M              HR:           1 bpm. Exam Location:  Inpatient Procedure: 2D Echo (Both Spectral and Color Flow Doppler were utilized during            procedure). Indications:    stroke  History:        Patient has no prior history of Echocardiogram examinations.  Sonographer:    Koleen Popper Referring Phys: [id number] SRISHTI L BHAGAT IMPRESSIONS  1. Echo not perfomred due to patient non compliance.  2. Left ventricular ejection fraction, by estimation, is Not done%. The left ventricle has not done function. Left ventricular endocardial border not optimally defined to evaluate regional wall motion. The left ventricular internal cavity size was not done. Left ventricular diastolic function could not be evaluated.  3. Right ventricular systolic function was not well visualized. The right ventricular size is not well visualized.  4. The mitral  valve was not assessed. No evidence of mitral valve regurgitation.  5. The aortic valve was not assessed. Aortic valve regurgitation is not visualized.  6. Aortic Not imaged. FINDINGS  Left Ventricle: Left  ventricular ejection fraction, by estimation, is Not done%. The left ventricle has not done function. Left ventricular endocardial border not optimally defined to evaluate regional wall motion. Strain was performed and the global longitudinal strain is indeterminate. The left ventricular internal cavity size was not done. Suboptimal image quality limits for assessment of left ventricular hypertrophy. Left ventricular diastolic function could not be evaluated. Right Ventricle: The right ventricular size is not well visualized. Right vetricular wall thickness was not assessed. Right ventricular systolic function was not well visualized. Left Atrium: Left atrial size was not assessed. Right Atrium: Right atrial size was not assessed. Pericardium: The pericardium was not assessed. Mitral Valve: The mitral valve was not assessed. No evidence of mitral valve regurgitation. Tricuspid Valve: The tricuspid valve is not assessed. Tricuspid valve regurgitation is not demonstrated. Aortic Valve: The aortic valve was not assessed. Aortic valve regurgitation is not visualized. Pulmonic Valve: The pulmonic valve was not assessed. Pulmonic valve regurgitation is not visualized. Aorta: Not imaged and the ascending aorta was not well visualized. IAS/Shunts: The interatrial septum was not assessed. Additional Comments: Echo not perfomred due to patient non compliance. 3D was performed not requiring image post processing on an independent workstation and was indeterminate. Maude Emmer MD Electronically signed by Maude Emmer MD Signature Date/Time: 06/07/2024/3:38:19 PM    Final    VAS US  CAROTID Result Date: 06/07/2024 Carotid Arterial Duplex Study Patient Name:  Ronald Lowe  Date of Exam:   06/07/2024 Medical Rec #: [id number]        Accession #:    [id number] Date of Birth: 1956-09-14       Patient Gender: M Patient Age:   1 years Exam Location:  [facility] Procedure:      VAS US  CAROTID Referring Phys: PRAMOD SETHI --------------------------------------------------------------------------------  Indications:       Carotid artery disease. Risk Factors:      Hypertension, hyperlipidemia, current smoker, coronary artery                    disease, PAD. Other Factors:     GERD, COPD, EtOH, CKD. Comparison Study:  CT Angio neck - Right ICA approximately 55% stenosis.                    Left ICA, nearly occlusive stenosis at the left carotid                    bifurcation with bulky calcific artherosclerosis. Performing Technologist: Ricka Sturdivant-Jones RDMS, RVT  Examination Guidelines: A complete evaluation includes B-mode imaging, spectral Doppler, color Doppler, and power Doppler as needed of all accessible portions of each vessel. Bilateral testing is considered an integral part of a complete examination. Limited examinations for reoccurring indications may be performed as noted.  Right Carotid Findings: +----------+--------+--------+--------+------------------+---------+           PSV cm/sEDV cm/sStenosisPlaque DescriptionComments  +----------+--------+--------+--------+------------------+---------+ CCA Prox  78      18                                          +----------+--------+--------+--------+------------------+---------+ CCA Distal79      22                                          +----------+--------+--------+--------+------------------+---------+  ICA Prox  111     38      1-39%   calcific          Shadowing +----------+--------+--------+--------+------------------+---------+ ICA Mid   102     22                                          +----------+--------+--------+--------+------------------+---------+ ICA Distal78      30                                           +----------+--------+--------+--------+------------------+---------+ ECA       137     26                                          +----------+--------+--------+--------+------------------+---------+ +----------+--------+-------+----------------+-------------------+           PSV cm/sEDV cmsDescribe        Arm Pressure (mmHG) +----------+--------+-------+----------------+-------------------+ Subclavian120            Multiphasic, WNL                    +----------+--------+-------+----------------+-------------------+ +---------+--------+--+--------+--+---------+ VertebralPSV cm/s57EDV cm/s15Antegrade +---------+--------+--+--------+--+---------+  Left Carotid Findings: +----------+--------+--------+--------+------------------+---------+           PSV cm/sEDV cm/sStenosisPlaque DescriptionComments  +----------+--------+--------+--------+------------------+---------+ CCA Prox  61      13                                          +----------+--------+--------+--------+------------------+---------+ CCA Distal44      11                                          +----------+--------+--------+--------+------------------+---------+ ICA Prox  311     90      60-79%  calcific          Shadowing +----------+--------+--------+--------+------------------+---------+ ICA Mid   49      18                                          +----------+--------+--------+--------+------------------+---------+ ICA Distal49      18                                          +----------+--------+--------+--------+------------------+---------+ ECA       43      13                                          +----------+--------+--------+--------+------------------+---------+ +----------+--------+--------+----------------+-------------------+           PSV cm/sEDV cm/sDescribe        Arm Pressure (mmHG) +----------+--------+--------+----------------+-------------------+  [id number]  Multiphasic, WNL                    +----------+--------+--------+----------------+-------------------+ +---------+--------+--+--------+--+---------+ VertebralPSV cm/s85EDV cm/s19Antegrade +---------+--------+--+--------+--+---------+   Summary: Right Carotid: Velocities in the right ICA are consistent with a 1-39% stenosis.                ICA velocities suggest upper end of scale. May be underestimated                due to heavey calcified plaque shadowing. Left Carotid: Velocities in the left ICA are consistent with a 60-79% stenosis.               ICA velocities suggest upper end of scale. May be underestimated               due to heavy calcified plaque shadowing.  *See table(s) above for measurements and observations.     Preliminary         Scheduled Meds:  arformoterol   15 mcg Nebulization BID   aspirin   300 mg Rectal Daily   Or   aspirin   325 mg Oral Daily   budesonide  (PULMICORT ) nebulizer solution  0.5 mg Nebulization BID   clopidogrel   75 mg Oral Daily   vitamin B-12  1,000 mcg Oral Daily   enoxaparin  (LOVENOX ) injection  40 mg Subcutaneous Q24H   folic acid   1 mg Oral Daily   LORazepam   1 mg Intravenous Once   multivitamin with minerals  1 tablet Oral Daily   nicotine   14 mg Transdermal Daily   pantoprazole  (PROTONIX ) IV  40 mg Intravenous Q24H   revefenacin   175 mcg Nebulization Daily   [START ON 06/15/2024] thiamine  (VITAMIN B1) injection  100 mg Intravenous Daily   Continuous Infusions:  lactated ringers  75 mL/hr at 06/09/24 0857   thiamine  (VITAMIN B1) injection 250 mg (06/09/24 0853)     LOS: 2 days    Time spent: 54 minutes spent on 06/09/2024 caring for this patient face-to-face including chart review, ordering labs/tests, documenting, discussion with nursing staff, consultants, updating family and interview/physical exam    Camellia PARAS [place], DO [facility] Available via Epic secure chat 7am-7pm After these hours,  please refer to coverage provider listed on [email or web address] 06/09/2024, 12:04 PM

## 2024-06-09 NOTE — Progress Notes (Signed)
 Echocardiogram 2D Echocardiogram has been performed.  Ronald Lowe 06/09/2024, 3:10 PM

## 2024-06-09 NOTE — Progress Notes (Signed)
 Patient is Ronald Lowe    INTERIM HISTORY/SUBJECTIVE Niece and RN are at the bedside. Per niece, pt was agitation overnight and not getting much sleep. Per RN, pt this morning was drowsy but able to follow simple commands, answer limited questions appropriately, drank some water and took the morning meds. On my rounding, pt was obtunded still, slightly opened eyes on voice and was able to tell me his name Ronald Lowe 2 times but severely dysarthric, but then eyes closed not answering any further questions.   OBJECTIVE  CBC    Component Value Date/Time   WBC 9.0 06/08/2024 0653   RBC 4.49 06/08/2024 0653   HGB 16.8 06/08/2024 0653   HGB 15.8 07/01/2022 1039   HCT 46.4 06/08/2024 0653   HCT 45.1 07/01/2022 1039   PLT 178 06/08/2024 0653   PLT 280 07/01/2022 1039   MCV 103.3 (H) 06/08/2024 0653   MCV 103 (H) 07/01/2022 1039   MCH 37.4 (H) 06/08/2024 0653   MCHC 36.2 (H) 06/08/2024 0653   RDW 13.9 06/08/2024 0653   RDW 13.7 07/01/2022 1039   LYMPHSABS 0.8 06/06/2024 2212   LYMPHSABS 0.8 07/01/2022 1039   MONOABS 0.7 06/06/2024 2212   EOSABS 0.0 06/06/2024 2212   EOSABS 0.2 07/01/2022 1039   BASOSABS 0.1 06/06/2024 2212   BASOSABS 0.1 07/01/2022 1039    BMET    Component Value Date/Time   NA 137 06/09/2024 0521   NA 133 (L) 07/01/2022 1039   K 3.5 06/09/2024 0521   CL 108 06/09/2024 0521   CO2 15 (L) 06/09/2024 0521   GLUCOSE 76 06/09/2024 0521   BUN 12 06/09/2024 0521   BUN 7 (L) 07/01/2022 1039   CREATININE 1.04 06/09/2024 0521   CALCIUM  8.6 (L) 06/09/2024 0521   EGFR 89 07/01/2022 1039   GFRNONAA >60 06/09/2024 0521    IMAGING past 24 hours No results found.   Vitals:   06/09/24 0409 06/09/24 0735 06/09/24 0808 06/09/24 1151  BP: 136/85 (!) 86/51 (!) 109/51 98/73  Pulse: 77 94 (!) 122 (!) 106  Resp: 18 18  18   Temp: 98.6 F (37 C) 98.1 F (36.7 C)  98.1 F (36.7 C)  TempSrc: Oral Oral  Oral  SpO2:  97%  99%  Weight:      Height:         PHYSICAL EXAM General: thin built, obtunded, only briefly open eyes slightly on voice CV: Regular rate and rhythm on monitor Respiratory:  Regular, unlabored respirations on room air  NEURO:  Pt obtunded, sonorus with Cheyne-stokes breathing pattern. Briefly and slightly opened eyes on voice, answered his name with severely dysarthric voice, but otherwise none verbal or following commands. Eyes midline, pupils 2mm, sluggish to light, not tracking. No significant facial droop. Tongue protrusion not cooperative. On pain stimulation, withdraw BLEs 3/5, slight withdraw on the LUE but no significant movement of RUE. RUE also swollen. Sensation, coordination and gait not tested.    ASSESSMENT/PLAN  Ronald Lowe is a 68 y.o. male with history of atrial fibrillation not on anticoagulation, hypertension, hyperlipidemia, alcohol abuse, ongoing smoking, cervical spine degenerative disc disease who was admitted for acute Ronald, potentially cardioembolic from A-fib. NIH on Admission: 9.  Ronald:  left MCA scattered infarcts, etiology likely due to symptomatic proximal left ICA stenosis CT Head without contrast Subtle hypoattenuation in the anterior left insula  CT angio Head and Neck Occluded proximal left M3 MCA. Critical, nearly occlusive stenosis at the left carotid bifurcation,  at least 80%. Approximately 55% stenosis of the right ICA. Moderate right V2 vertebral artery stenosis due to compression from adjacent osteophyte. MRI Brain Multiple acute infarcts in the left MCA territories, including the left basal ganglia, left posterior limb of the internal capsule, left anterior insula, overlying left frontal and parietal lobes and the left occipital lobe. No mass effect. Carotid Doppler L ICA 60-79% stenosis 2D Echo: pending LDL 66 HgbA1c 4.2 UDS + cocaine, amphetamine, THC, benzo VTE prophylaxis - lovenox  No antithrombotic prior to admission, now on DAPT with ASA 81mg  and Plavix .  Therapy  recommendations:  CIR Disposition:  pending  Carotid stenosis CT angio Head and Neck critical, nearly occlusive stenosis at the left carotid bifurcation, at least 80%. Approximately 55% stenosis of the right ICA. Symptomatic this time On DAPT Pt not candidate for intervention now. Will discuss with IR once pt more medically appropriate  Atrial fibrillation Home Meds: no OAC due to cost per family, noted on chart that patient does not like how it makes him feel.  Continue telemetry monitoring Now on DAPT  Not good candidate for [facility] due to alcohol and substance abuse, fall risk and noncompliance  Hypertension Home meds: Lisinopril  5 mg Stable but fluctuate Avoid hypotension due to intracranial stenosis Long term BP goal normotensive  Hyperlipidemia Home meds:  none LDL 66, goal < 70 High intensity statin not indicated due to LDL within goal and no po access for now  Alcohol abuse B12 deficiency  Intermittent agitation  On CIWA protocol On B1/FA/MVI B12 = 180, supplement B12 1000mcg daily  Tobacco Abuse Patient smokes 0.25 packs per day for 53 years, smokes marijuana Will provide cessation education once appropriate Nicotine  replacement therapy provided  Substance Abuse UDS positive for THC, cocaine, benzos, amphetamines Will provide cessation education once appropriate TOC consult for cessation placed  Dysphagia Patient has post-Ronald dysphagia, SLP consulted Now on dys2 and thin liquid Given mental status with CIWA protocol, pt high risk of aspiration Advance diet as tolerated  Other Ronald Risk Factors Advanced age Family hx Ronald (mother)  Other Active Problems History of medication noncompliance RUE swollen - UE venous doppler pending  Hospital day # 2   Ary Cummins, MD PhD [facility] 06/09/2024 11:52 AM

## 2024-06-09 NOTE — PMR Pre-admission (Signed)
 PMR Admission Coordinator Pre-Admission Assessment  Patient: Ronald Lowe is an 68 y.o., male MRN: [id number] DOB: 1956/05/31 Height: 6' (182.9 cm) Weight: 68.8 kg  Insurance Information HMO:  yes   PPO:      PCP:      IPA:      80/20:      OTHER:  PRIMARY: [employer]      Policy#: [id number]      Subscriber: pt CM Name: lorraine      Pt. Approved on 6/26 by Caswell Solian at [facility] for 7 days from day of admit. Auth good for 5 days   Phone#:[phone]   Fax#: [phone]     Pre-Cert#: [id number]      Employer:  Benefits:  Phone #:      Name:  Eff. Date: 12/20/23     Deduct: 257, 119.25 met      Out of Pocket Max: 9350       Life Max: none CIR: 2185 copay/admission      SNF: 0/day days 1-20, 214/day days 21-100 Outpatient: 80%     Co-Pay: 20% Home Health: 100%      DME: 80%     Co-Pay: 20% Providers: in network  SECONDARY:   Medicaid of [place]     Policy#:   [id number]       Phone#:   Financial Counselor:       Phone#:   The Engineer, materials Information Summary" for patients in Inpatient Rehabilitation Facilities with attached "Privacy Act Statement-Health Care Records" was provided and verbally reviewed with: Patient and Family  Emergency Contact Information Contact Information     Name Relation Home Work Mobile   Hicks,[name] Spouse [phone]  [phone]   Maritza Mercy Lin   [phone]      Other Contacts   None on File     Current Medical History  Patient Admitting Diagnosis: CVA, withdrawals History of Present Illness: Ronald Lowe is a 68 year old male with past medical history significant for HTN, HLD, paroxysmal atrial fibrillation noncompliant with anticoagulation, GERD, CKD, COPD, EtOH/tobacco use disorder, GERD who presented to [facility] ED on 06/06/2024 via EMS after family found him sitting on the floor confused with right arm weakness.  Last known normal was earlier in the morning at 7:30 AM when he left for work.  Additionally family concerned  about increasing episodes of confusion, possible underlying dementia, complicated by his heavy alcohol use.  On arrival to the ED, code stroke was initiated. MR brain without contrast with multiple acute infarcts left MCA, left PCA, left basal ganglia, left posterior limb of the internal capsule, left anterior insula, overlying left frontal and parietal lobes of the left occipital lobe without mass effect. CT angiogram head/neck with occluded proximal left M3 MCA, nearly occlusive stenosis left carotid bifurcation, 55% stenosis right ICA, moderate V2 vertebral artery stenosis due to compression from adjacent osteophyte. UDS positive for THC, cocaine, benzodiazepines, amphetamines. Hemoglobin A1c 4.2. LDL 66. Carotid ultrasound with 60 to 79% stenosis left ICA. Pt. With signs of withdrawal and started on CIWA protocol. He was seen by PT/OT/SLP and they recommended CIR to assist return to PLOF.  Complete NIHSS TOTAL: 7  Patient's medical [facility] from [facility]  has been reviewed by the rehabilitation admission coordinator and physician.  Past Medical History  Past Medical History:  Diagnosis Date   Aortic atherosclerosis (HCC)    Cervical spinal stenosis    Chronic kidney disease    COPD (chronic obstructive  pulmonary disease) (HCC)    Coronary artery calcification seen on CT scan    ETOH abuse    a.) 28+ standard drinks/week   GERD (gastroesophageal reflux disease)    History of amiodarone  therapy    a.) in the past; self discontinued   History of medication noncompliance    a.) reported 10/2022 that he has been off NOAC for several months secondary to vertiginous symptoms, not acting like himself, and depression; b.) off/not taking as prescribed: antiarrhythmics, antihypertensives, statins, and MDIs   Hyperlipidemia    Hypertension    Long term current use of anticoagulant    a.) rivaroxaban    PAF (paroxysmal atrial fibrillation) (HCC)    a.) CHA2DS2VASc = 3 (age, HTN,  vascular disease history);  b.) rate/rhythm maintained on oral carvediolol (self discontinued diltiazem  + amiodarone  + metoprolol ); chronically anticoagulated with rivaroxaban  (switched from apixaban  11/17/2022)   Tobacco abuse     Has the patient had major surgery during 100 days prior to admission? No  Family History   family history includes CAD in his father; CAD (age of onset: 25) in his brother; Emphysema in his father; Hypertension in his brother; Stroke in his mother.  Current Medications  Current Facility-Administered Medications:    acetaminophen  (TYLENOL ) tablet 650 mg, 650 mg, Oral, Q4H PRN **OR** acetaminophen  (TYLENOL ) 160 MG/5ML solution 650 mg, 650 mg, Per Tube, Q4H PRN **OR** acetaminophen  (TYLENOL ) suppository 650 mg, 650 mg, Rectal, Q4H PRN, Foust, Katy L, NP   albuterol  (PROVENTIL ) (2.5 MG/3ML) 0.083% nebulizer solution 2.5 mg, 2.5 mg, Nebulization, Q4H PRN, Foust, Katy L, NP   arformoterol  (BROVANA ) nebulizer solution 15 mcg, 15 mcg, Nebulization, BID, [place], Eric J, DO, 15 mcg at 06/09/24 9160   aspirin  suppository 300 mg, 300 mg, Rectal, Daily, 300 mg at 06/07/24 1246 **OR** aspirin  tablet 325 mg, 325 mg, Oral, Daily, Bhagat, Srishti L, MD, 325 mg at 06/09/24 0848   budesonide  (PULMICORT ) nebulizer solution 0.5 mg, 0.5 mg, Nebulization, BID, [place], Eric J, DO, 0.5 mg at 06/09/24 9160   clopidogrel  (PLAVIX ) tablet 75 mg, 75 mg, Oral, Daily, Lehner, Erin C, NP, 75 mg at 06/09/24 0848   cyanocobalamin  (VITAMIN B12) tablet 1,000 mcg, 1,000 mcg, Oral, Daily, Jerri Pfeiffer, MD, 1,000 mcg at 06/09/24 1326   enoxaparin  (LOVENOX ) injection 40 mg, 40 mg, Subcutaneous, Q24H, Foust, Katy L, NP, 40 mg at 06/08/24 1744   folic acid  (FOLVITE ) tablet 1 mg, 1 mg, Oral, Daily, Foust, Katy L, NP, 1 mg at 06/09/24 0848   haloperidol  lactate (HALDOL ) injection 2 mg, 2 mg, Intramuscular, Q6H PRN, [place], Camellia PARAS, DO, 2 mg at 06/09/24 9470   labetalol  (NORMODYNE ) injection 10 mg, 10 mg,  Intravenous, Q2H PRN, Foust, Katy L, NP   lactated ringers  infusion, , Intravenous, Continuous, [place], Camellia PARAS, DO, Last Rate: 75 mL/hr at 06/09/24 0857, New Bag at 06/09/24 0857   LORazepam  (ATIVAN ) injection 1 mg, 1 mg, Intravenous, Once, Rosemarie Eather RAMAN, MD   LORazepam  (ATIVAN ) tablet 1-4 mg, 1-4 mg, Oral, Q1H PRN **OR** LORazepam  (ATIVAN ) injection 1-4 mg, 1-4 mg, Intravenous, Q1H PRN, Foust, Katy L, NP, 2 mg at 06/09/24 0030   multivitamin with minerals tablet 1 tablet, 1 tablet, Oral, Daily, Foust, Katy L, NP, 1 tablet at 06/09/24 0848   nicotine  (NICODERM CQ  - dosed in mg/24 hours) patch 14 mg, 14 mg, Transdermal, Daily, Foust, Katy L, NP, 14 mg at 06/09/24 0857   pantoprazole  (PROTONIX ) injection 40 mg, 40 mg, Intravenous, Q24H, Foust, Katy L,  NP, 40 mg at 06/09/24 0848   revefenacin  (YUPELRI ) nebulizer solution 175 mcg, 175 mcg, Nebulization, Daily, [place], Eric J, DO, 175 mcg at 06/09/24 9160   sodium chloride  flush (NS) 0.9 % injection 3-10 mL, 3-10 mL, Intravenous, PRN, Foust, Katy L, NP   [COMPLETED] thiamine  (VITAMIN B1) 500 mg in sodium chloride  0.9 % 50 mL IVPB, 500 mg, Intravenous, Q8H, Last Rate: 110 mL/hr at 06/09/24 0025, 500 mg at 06/09/24 0025 **FOLLOWED BY** thiamine  (VITAMIN B1) 250 mg in sodium chloride  0.9 % 50 mL IVPB, 250 mg, Intravenous, Daily, Last Rate: 105 mL/hr at 06/09/24 0853, 250 mg at 06/09/24 0853 **FOLLOWED BY** [START ON 06/15/2024] thiamine  (VITAMIN B1) injection 100 mg, 100 mg, Intravenous, Daily, Bhagat, Srishti L, MD  Patients Current Diet:  Diet Order             DIET DYS 2 Room service appropriate? Yes with Assist; Fluid consistency: Thin  Diet effective now                   Precautions / Restrictions Precautions Precautions: Fall, Other (comment) Precaution/Restrictions Comments: R forearm xray pending at time of eval Restrictions Weight Bearing Restrictions Per Provider Order: No   Has the patient had 2 or more falls or a fall with  injury in the past year? Yes  Prior Activity Level Community (5-7x/wk): Pt. active in the community PTA  Prior Functional Level Self Care: Did the patient need help bathing, dressing, using the toilet or eating? Independent  Indoor Mobility: Did the patient need assistance with walking from room to room (with or without device)? Independent  Stairs: Did the patient need assistance with internal or external stairs (with or without device)? Independent  Functional Cognition: Did the patient need help planning regular tasks such as shopping or remembering to take medications? Independent  Patient Information Are you of Hispanic, Latino/a,or Spanish origin?: A. No, not of Hispanic, Latino/a, or Spanish origin (proxy) What is your race?: A. White Do you need or want an interpreter to communicate with a doctor or health care staff?: 0. No  Patient's Response To:  Health Literacy and Transportation Is the patient able to respond to health literacy and transportation needs?: No (proxy) Health Literacy - How often do you need to have someone help you when you read instructions, pamphlets, or other written material from your doctor or pharmacy?: Patient unable to respond In the past 12 months, has lack of transportation kept you from medical appointments or from getting medications?: No In the past 12 months, has lack of transportation kept you from meetings, work, or from getting things needed for daily living?: No  Journalist, newspaper / Equipment Home Equipment: None  Prior Device Use: Indicate devices/aids used by the patient prior to current illness, exacerbation or injury? None of the above  Current Functional Level Cognition  Arousal/Alertness: Awake/alert Overall Cognitive Status: No family/caregiver present to determine baseline cognitive functioning Orientation Level: Oriented to person Attention: Sustained Sustained Attention: Impaired Sustained Attention Impairment: Verbal  basic, Functional basic Memory: Impaired Memory Impairment: Decreased recall of new information Awareness: Impaired Awareness Impairment: Intellectual impairment Problem Solving: Impaired Problem Solving Impairment: Functional basic    Extremity Assessment (includes Sensation/Coordination)  Upper Extremity Assessment: RUE deficits/detail, LUE deficits/detail RUE Deficits / Details: difficult to assess, seemingly reported pain somewhere on the R extremeity. R forearm xray reult pending at the time of evaluation. Pt actively moving arm some, difficult to assess due to impaired cognition RUE Coordination: decreased  gross motor, decreased fine motor LUE Deficits / Details: Pt able to lift arm against gravity, does not hold against any resistance. Difficult to truly assess due to impaired cognition LUE Coordination: decreased fine motor, decreased gross motor  Lower Extremity Assessment: Defer to PT evaluation RLE Deficits / Details: AROM on command (SLR and ankle pump), moving in synergistic pattern, unable to follow commands for MMT    ADLs  Overall ADL's : Needs assistance/impaired Eating/Feeding: NPO Grooming: Maximal assistance Upper Body Bathing: Cueing for safety Lower Body Bathing: Total assistance, +2 for physical assistance, +2 for safety/equipment Upper Body Dressing : Maximal assistance Lower Body Dressing: +2 for physical assistance, Total assistance, +2 for safety/equipment Toilet Transfer: Total assistance, +2 for physical assistance, +2 for safety/equipment Toileting- Clothing Manipulation and Hygiene: Total assistance, +2 for physical assistance, +2 for safety/equipment Functional mobility during ADLs: Maximal assistance, +2 for physical assistance, +2 for safety/equipment General ADL Comments: assist due to impaired cognition, poor sitting balance, R weakness,    Mobility  Overal bed mobility: Needs Assistance Bed Mobility: Supine to Sit, Sit to Supine Supine to sit: +2  for physical assistance, Mod assist, HOB elevated Sit to supine: +2 for physical assistance, Mod assist, HOB elevated General bed mobility comments: assist with trunk and RLE    Transfers  General transfer comment: unable to safely progress beyond edge of stretcher in ED    Ambulation / Gait / Stairs / Wheelchair Mobility       Posture / Balance Dynamic Sitting Balance Sitting balance - Comments: mod assist to maintain sitting balance, increased R lean with fatigue Balance Overall balance assessment: Needs assistance Sitting-balance support: Bilateral upper extremity supported Sitting balance-Leahy Scale: Poor Sitting balance - Comments: mod assist to maintain sitting balance, increased R lean with fatigue Postural control: Right lateral lean    Special needs/care consideration Bowel and Bladder incontinence and External Urinary Catheter   Previous Home Environment (from acute therapy documentation) Living Arrangements: Spouse/significant other  Lives With: Spouse Available Help at Discharge: Family Type of Home: House Home Layout: One level Home Access: Stairs to enter Secretary/administrator of Steps: 1 Bathroom Shower/Tub: Engineer, manufacturing systems: Standard Bathroom Accessibility: Yes How Accessible: Accessible via walker Home Care Services: No Additional Comments: Pt unable to provide hx. Most info obtained from previous admission (2022).  Discharge Living Setting Plans for Discharge Living Setting: Patient's home Type of Home at Discharge: House Discharge Home Layout: One level Discharge Home Access: Stairs to enter Entrance Stairs-Rails: None Entrance Stairs-Number of Steps: 1 Discharge Bathroom Shower/Tub: Tub/shower unit Discharge Bathroom Toilet: Standard Discharge Bathroom Accessibility: Yes How Accessible: Accessible via walker Does the patient have any problems obtaining your medications?: No  Social/Family/Support Systems Patient Roles:  Spouse Contact Information: [phone] Anticipated Caregiver: Maeola Irving Caregiver Availability: 24/7 Discharge Plan Discussed with Primary Caregiver: Yes Is Caregiver In Agreement with Plan?: Yes Does Caregiver/Family have Issues with Lodging/Transportation while Pt is in Rehab?: No  Goals Patient/Family Goal for Rehab: PT/OT/SLPMin A Expected length of stay: 14-16 days Pt/Family Agrees to Admission and willing to participate: Yes Program Orientation Provided & Reviewed with Pt/Caregiver Including Roles  & Responsibilities: Yes  Decrease burden of Care through IP rehab admission: not anticipated  Possible need for SNF placement upon discharge: not anticipated  Patient Condition: This patient's medical and functional status has changed since the consult dated: 06/11/24 in which the Rehabilitation Physician determined and documented that the patient's condition is appropriate for intensive rehabilitative care in an inpatient rehabilitation  facility. See History of Present Illness (above) for medical update. Functional changes are: Pt currently Min A +2-Mod A +2 with mobility and Max A with ADLs. Patient's medical and functional status update has been discussed with the Rehabilitation physician and patient remains appropriate for inpatient rehabilitation. Will admit to inpatient rehab today.  Preadmission Screen Completed By:  Leita KATHEE Kleine, 06/09/2024 2:36 PM ______________________________________________________________________   Discussed status with Dr. Emeline on 06/14/24 at 11:09 AM and received approval for admission today.  Admission Coordinator:  Leita KATHEE Kleine, CCC-SLP, time 11:09 AM/Date 06/14/24    Assessment/Plan: Diagnosis: L MCA CVA d/t L ICA stenosis Does the need for close, 24 hr/day Medical supervision in concert with the patient's rehab needs make it unreasonable for this patient to be served in a less intensive setting? Yes Co-Morbidities requiring  supervision/potential complications: Alcohol withdrawal/mild Delirium tremens, metabolic encephalopathy/delirium, hypomagnasemia, hypokalemia, AKI on CKD, HTN, A fib, COPD Due to bladder management, bowel management, safety, skin/wound care, disease management, medication administration, pain management, and patient education, does the patient require 24 hr/day rehab nursing? Yes Does the patient require coordinated care of a physician, rehab nurse, PT, OT, and SLP to address physical and functional deficits in the context of the above medical diagnosis(es)? Yes Addressing deficits in the following areas: balance, endurance, locomotion, strength, transferring, bowel/bladder control, bathing, dressing, feeding, grooming, toileting, cognition, speech, language, and psychosocial support Can the patient actively participate in an intensive therapy program of at least 3 hrs of therapy 5 days a week? Yes The potential for patient to make measurable gains while on inpatient rehab is good Anticipated functional outcomes upon discharge from inpatient rehab: min assist PT, min assist OT, min assist SLP Estimated rehab length of stay to reach the above functional goals is: 14-16 days Anticipated discharge destination: Home 10. Overall Rehab/Functional Prognosis: good   MD Signature:  Joesph JAYSON Emeline, DO 06/14/2024

## 2024-06-10 DIAGNOSIS — I6523 Occlusion and stenosis of bilateral carotid arteries: Secondary | ICD-10-CM | POA: Diagnosis not present

## 2024-06-10 DIAGNOSIS — Z9889 Other specified postprocedural states: Secondary | ICD-10-CM

## 2024-06-10 DIAGNOSIS — I63512 Cerebral infarction due to unspecified occlusion or stenosis of left middle cerebral artery: Secondary | ICD-10-CM | POA: Diagnosis not present

## 2024-06-10 DIAGNOSIS — I4891 Unspecified atrial fibrillation: Secondary | ICD-10-CM | POA: Diagnosis not present

## 2024-06-10 DIAGNOSIS — Z79899 Other long term (current) drug therapy: Secondary | ICD-10-CM

## 2024-06-10 DIAGNOSIS — Z7901 Long term (current) use of anticoagulants: Secondary | ICD-10-CM

## 2024-06-10 DIAGNOSIS — I639 Cerebral infarction, unspecified: Secondary | ICD-10-CM

## 2024-06-10 DIAGNOSIS — I69391 Dysphagia following cerebral infarction: Secondary | ICD-10-CM | POA: Diagnosis not present

## 2024-06-10 DIAGNOSIS — I480 Paroxysmal atrial fibrillation: Secondary | ICD-10-CM

## 2024-06-10 DIAGNOSIS — I6522 Occlusion and stenosis of left carotid artery: Secondary | ICD-10-CM

## 2024-06-10 DIAGNOSIS — Z91148 Patient's other noncompliance with medication regimen for other reason: Secondary | ICD-10-CM

## 2024-06-10 DIAGNOSIS — F17210 Nicotine dependence, cigarettes, uncomplicated: Secondary | ICD-10-CM

## 2024-06-10 LAB — PHOSPHORUS: Phosphorus: 3.2 mg/dL (ref 2.5–4.6)

## 2024-06-10 LAB — COMPREHENSIVE METABOLIC PANEL WITH GFR
ALT: 20 U/L (ref 0–44)
AST: 38 U/L (ref 15–41)
Albumin: 3.2 g/dL — ABNORMAL LOW (ref 3.5–5.0)
Alkaline Phosphatase: 44 U/L (ref 38–126)
Anion gap: 12 (ref 5–15)
BUN: 12 mg/dL (ref 8–23)
CO2: 16 mmol/L — ABNORMAL LOW (ref 22–32)
Calcium: 8.7 mg/dL — ABNORMAL LOW (ref 8.9–10.3)
Chloride: 109 mmol/L (ref 98–111)
Creatinine, Ser: 1.09 mg/dL (ref 0.61–1.24)
GFR, Estimated: >60 mL/min (ref >60)
Glucose, Bld: 84 mg/dL (ref 70–99)
Potassium: 3.3 mmol/L — ABNORMAL LOW (ref 3.5–5.1)
Sodium: 137 mmol/L (ref 135–145)
Total Bilirubin: 1.7 mg/dL — ABNORMAL HIGH (ref 0.0–1.2)
Total Protein: 5.7 g/dL — ABNORMAL LOW (ref 6.5–8.1)

## 2024-06-10 LAB — CBC
HCT: 39.2 % (ref 39.0–52.0)
Hemoglobin: 14.2 g/dL (ref 13.0–17.0)
MCH: 37.2 pg — ABNORMAL HIGH (ref 26.0–34.0)
MCHC: 36.2 g/dL — ABNORMAL HIGH (ref 30.0–36.0)
MCV: 102.6 fL — ABNORMAL HIGH (ref 80.0–100.0)
Platelets: 193 10*3/uL (ref 150–400)
RBC: 3.82 MIL/uL — ABNORMAL LOW (ref 4.22–5.81)
RDW: 14.1 % (ref 11.5–15.5)
WBC: 6.1 10*3/uL (ref 4.0–10.5)
nRBC: 0 % (ref 0.0–0.2)

## 2024-06-10 LAB — MAGNESIUM: Magnesium: 1.4 mg/dL — ABNORMAL LOW (ref 1.7–2.4)

## 2024-06-10 MED ORDER — ENSURE PLUS HIGH PROTEIN PO LIQD
237.0000 mL | Freq: Two times a day (BID) | ORAL | Status: DC
  Administered 2024-06-11 – 2024-06-14 (×7): 237 mL via ORAL

## 2024-06-10 MED ORDER — POTASSIUM CHLORIDE 10 MEQ/100ML IV SOLN
10.0000 meq | INTRAVENOUS | Status: AC
  Administered 2024-06-10 (×6): 10 meq via INTRAVENOUS
  Filled 2024-06-10 (×6): qty 100

## 2024-06-10 MED ORDER — MAGNESIUM SULFATE 4 GM/100ML IV SOLN
4.0000 g | Freq: Once | INTRAVENOUS | Status: AC
  Administered 2024-06-10: 4 g via INTRAVENOUS
  Filled 2024-06-10: qty 100

## 2024-06-10 MED ORDER — LACTATED RINGERS IV SOLN: INTRAVENOUS | Status: AC

## 2024-06-10 NOTE — Progress Notes (Signed)
 Speech Language Pathology Treatment: Dysphagia  Patient Details Name: Ronald Lowe MRN: [id number] DOB: 1956/07/17 Today's Date: 06/10/2024 Time: 9062-9054 SLP Time Calculation (min) (ACUTE ONLY): 8 min  Assessment / Plan / Recommendation Clinical Impression  Pt is lethargic but rouses easily and engages with SLP. He is disoriented and needs cueing for PO trials. Thin liquids did not result in any signs clinically concerning for aspiration. Pt states he does not wear dentures at baseline but mastication was mildly prolonged with Dys 2 solids, although he cleared his oral cavity effectively across multiple trials. Recommend continuing current diet when pt is awake and alert. SLP will continue following to assess pt's readiness for diet advance.    HPI HPI: Ronald Lowe is a 68 yo male presenting to ED 6/19 with R sided weakness and dysarthria. MRI shows multiple acute infarcts in the L MCA and L PCA territories, including the L basal ganglia, L posterior limb of the internal capsule, L anterior insula, overlying L frontal and parietal lobes, and L occipital lobe. Failed yale due to lethargy. PMH includes daily ETOH use, HTN, A-fib not on anticoagulation, ongoing tobacco use, hepatitis C      SLP Plan  Continue with current plan of care          Recommendations  Diet recommendations: Dysphagia 2 (fine chop);Thin liquid Liquids provided via: Cup;Straw Medication Administration: Whole meds with liquid Supervision: Staff to assist with self feeding;Full supervision/cueing for compensatory strategies Compensations: Minimize environmental distractions;Slow rate;Small sips/bites Postural Changes and/or Swallow Maneuvers: Seated upright 90 degrees                  Oral care BID;Staff/trained caregiver to provide oral care   Frequent or constant Supervision/Assistance Dysarthria and anarthria (R47.1);Cognitive communication deficit (R41.841);Dysphagia, unspecified (R13.10)      Continue with current plan of care     Damien Blumenthal, M.A., CCC-SLP Speech Language Pathology, Acute Rehabilitation Services  Secure Chat preferred [phone]  06/10/2024, 9:52 AM

## 2024-06-10 NOTE — TOC CAGE-AID Note (Signed)
 Transition of Care [facility]) - CAGE-AID Screening   Patient Details  Name: Ronald Lowe MRN: [id number] Date of Birth: 1956-06-13  [facility] CM/SW Contact:    Antonia Jicha E Avin Upperman, LCSW Phone Number: 06/10/2024, 9:18 AM   Clinical Narrative: Disoriented x 3.   CAGE-AID Screening: Substance Abuse Screening unable to be completed due to: : Patient unable to participate

## 2024-06-10 NOTE — Progress Notes (Signed)
 Physical Therapy Treatment Patient Details Name: Ronald Lowe MRN: [id number] DOB: 1956/06/09 Today's Date: 06/10/2024   History of Present Illness Pt is a 68 y.o. male admitted 6/19. He presented to the ED with confusion, R side weakness, slurred speech, and L gaze. MRI revealed multiple acute infarcts in the L MCA and left PCA territories. PMH: daily alcohol use, HTN, a fib not on anticoagulation (secondary to cost per family, per chart review patient reported not liking how the medication made him feel), ongoing smoking, hep C    PT Comments  Pt received in supine and agreeable to session. Pt requires increased cues and assist throughout session due to impaired cognition and command following. Pt requires mod-max A +2 for all mobility tasks due to weakness and R lateral lean. Pt able to improve R lean in sitting with L leans onto forearm, but is unable to correct in standing without assist despite max cues for awareness. R lateral and posterior leans increased with prolonged standing and stepping attempts requiring increased assist. Pt continues to benefit from PT services to progress toward functional mobility goals.    If plan is discharge home, recommend the following: Two people to help with walking and/or transfers;Two people to help with bathing/dressing/bathroom;Assistance with cooking/housework;Assist for transportation   Can travel by private vehicle        Equipment Recommendations  Other (comment) (TBD)    Recommendations for Other Services Rehab consult     Precautions / Restrictions Precautions Precautions: Fall;Other (comment) Recall of Precautions/Restrictions: Impaired Precaution/Restrictions Comments: posey belt Restrictions Weight Bearing Restrictions Per Provider Order: No     Mobility  Bed Mobility Overal bed mobility: Needs Assistance Bed Mobility: Supine to Sit, Sit to Supine     Supine to sit: Mod assist, +2 for safety/equipment, +2 for physical  assistance Sit to supine: Mod assist, +2 for physical assistance, +2 for safety/equipment   General bed mobility comments: assist for BLE advancement and trunk management. increased cues and time    Transfers Overall transfer level: Needs assistance Equipment used: Rolling walker (2 wheels) Transfers: Sit to/from Stand Sit to Stand: Mod assist, +2 physical assistance, +2 safety/equipment           General transfer comment: From EOB with mod A +2 for power up and balance due to R lateral lean. Pt able to take a few side steps towards HOB with max A +2 for weight shifting and advancing feet. Pt more pivoting feet rather than clearing. Increased R lean with prolonged standing and stepping.    Ambulation/Gait               General Gait Details: unable to progress   Stairs             Wheelchair Mobility     Tilt Bed    Modified Rankin (Stroke Patients Only) Modified Rankin (Stroke Patients Only) Pre-Morbid Rankin Score: No symptoms Modified Rankin: Severe disability     Balance Overall balance assessment: Needs assistance Sitting-balance support: Feet supported Sitting balance-Leahy Scale: Poor Sitting balance - Comments: mod assist to maintain sitting balance, increased R lean with fatigue Postural control: Right lateral lean, Posterior lean Standing balance support: Bilateral upper extremity supported, During functional activity, Reliant on assistive device for balance Standing balance-Leahy Scale: Poor Standing balance comment: with RW and mod A +2                            Communication  Communication Communication: Impaired Factors Affecting Communication: Difficulty expressing self;Reduced clarity of speech  Cognition Arousal: Lethargic Behavior During Therapy: Restless   PT - Cognitive impairments: No family/caregiver present to determine baseline, Difficult to assess, Orientation, Awareness, Memory, Attention, Problem solving,  Safety/Judgement Difficult to assess due to: Impaired communication Orientation impairments: Place, Time, Situation                     Following commands: Impaired Following commands impaired: Follows one step commands inconsistently, Follows one step commands with increased time    Cueing Cueing Techniques: Verbal cues, Gestural cues, Tactile cues  Exercises      General Comments General comments (skin integrity, edema, etc.): VSS, wife present      Pertinent Vitals/Pain Pain Assessment Pain Assessment: Faces Faces Pain Scale: Hurts a little bit Pain Location: generalized with movement Pain Descriptors / Indicators: Discomfort, Grimacing, Guarding, Moaning Pain Intervention(s): Limited activity within patient's tolerance, Monitored during session     PT Goals (current goals can now be found in the care plan section) Acute Rehab PT Goals Patient Stated Goal: unable to state PT Goal Formulation: Patient unable to participate in goal setting Time For Goal Achievement: 06/21/24 Progress towards PT goals: Progressing toward goals    Frequency    Min 3X/week           Co-evaluation PT/OT/SLP Co-Evaluation/Treatment: Yes Reason for Co-Treatment: For patient/therapist safety;To address functional/ADL transfers;Necessary to address cognition/behavior during functional activity PT goals addressed during session: Mobility/safety with mobility;Balance;Proper use of DME OT goals addressed during session: ADL's and self-care      AM-PAC PT 6 Clicks Mobility   Outcome Measure  Help needed turning from your back to your side while in a flat bed without using bedrails?: A Lot Help needed moving from lying on your back to sitting on the side of a flat bed without using bedrails?: A Lot Help needed moving to and from a bed to a chair (including a wheelchair)?: Total Help needed standing up from a chair using your arms (e.g., wheelchair or bedside chair)?: Total Help  needed to walk in hospital room?: Total Help needed climbing 3-5 steps with a railing? : Total 6 Click Score: 8    End of Session Equipment Utilized During Treatment: Gait belt Activity Tolerance: Patient tolerated treatment well Patient left: in bed;with call bell/phone within reach;with family/visitor present;with restraints reapplied;with bed alarm set Nurse Communication: Mobility status PT Visit Diagnosis: Other abnormalities of gait and mobility (R26.89);Hemiplegia and hemiparesis Hemiplegia - Right/Left: Right Hemiplegia - caused by: Cerebral infarction     Time: 1321-1350 PT Time Calculation (min) (ACUTE ONLY): 29 min  Charges:    $Therapeutic Activity: 8-22 mins PT General Charges $$ ACUTE PT VISIT: 1 Visit                     Darryle George, PTA Acute Rehabilitation Services Secure Chat Preferred  [phone]    Darryle George 06/10/2024, 3:41 PM

## 2024-06-10 NOTE — TOC Initial Note (Signed)
 Transition of Care [facility]) - Initial/Assessment Note    Patient Details  Name: Ronald Lowe MRN: [id number] Date of Birth: 06-Sep-1956  [facility] CM/SW Contact:    Andrez JULIANNA George, RN Phone Number: 06/10/2024, 3:51 PM  Clinical Narrative:                  Pt is from home with spouse. Current plan is for [facility] on Thursday.  CIR following for potential rehab admission after surgery.  TOC following.  Expected Discharge Plan: IP Rehab Facility Barriers to Discharge: Continued Medical Work up   Patient Goals and CMS Choice   CMS Medicare.gov Compare Post Acute Care list provided to:: Patient Represenative (must comment) Choice offered to / list presented to : Spouse      Expected Discharge Plan and Services   Discharge Planning Services: CM Consult Post Acute Care Choice: IP Rehab Living arrangements for the past 2 months: Single Family Home                                      Prior Living Arrangements/Services Living arrangements for the past 2 months: Single Family Home Lives with:: Spouse Patient language and need for interpreter reviewed:: Yes          Care giver support system in place?: Yes (comment)   Criminal Activity/Legal Involvement Pertinent to Current Situation/Hospitalization: No - Comment as needed  Activities of Daily Living   ADL Screening (condition at time of admission) Independently performs ADLs?: No Does the patient have a NEW difficulty with bathing/dressing/toileting/self-feeding that is expected to last >3 days?: Yes (Initiates electronic notice to provider for possible OT consult) Does the patient have a NEW difficulty with getting in/out of bed, walking, or climbing stairs that is expected to last >3 days?: Yes (Initiates electronic notice to provider for possible PT consult) Does the patient have a NEW difficulty with communication that is expected to last >3 days?: Yes (Initiates electronic notice to provider for  possible SLP consult) Is the patient deaf or have difficulty hearing?: No Does the patient have difficulty seeing, even when wearing glasses/contacts?: No Does the patient have difficulty concentrating, remembering, or making decisions?: Yes  Permission Sought/Granted                  Emotional Assessment           Psych Involvement: No (comment)  Admission diagnosis:  Paroxysmal atrial fibrillation (HCC) [I48.0] Elevated blood pressure reading [R03.0] Long QT interval [R94.31] Essential hypertension [I10] Acute CVA (cerebrovascular accident) (HCC) [I63.9] Acute cerebrovascular accident (CVA) (HCC) [I63.9] Patient Active Problem List   Diagnosis Date Noted   Hyperlipidemia 06/07/2024   Right arm pain 06/07/2024   Acute cerebrovascular accident (CVA) (HCC) 06/07/2024   Acute CVA (cerebrovascular accident) (HCC) 06/06/2024   Status post left rotator cuff repair 02/02/2023   Foraminal stenosis of cervical region 03/23/2022   Left arm weakness 03/23/2022   Paroxysmal atrial fibrillation (HCC) 12/22/2021   Secondary hypercoagulable state (HCC) 12/22/2021   Medication management 10/20/2021   Chronic obstructive pulmonary disease (HCC) 10/19/2021   Alcohol withdrawal delirium (HCC) 06/17/2021   Atrial fibrillation with rapid ventricular response (HCC) 06/17/2021   Macrocytic anemia 06/17/2021   COPD exacerbation (HCC) 06/14/2021   Multifocal pneumonia    CAP (community acquired pneumonia) 06/13/2021   Emphysema lung (HCC) 03/17/2021   Positive hepatitis C antibody test 10/02/2019  Tobacco dependence 09/24/2019   Alcohol use disorder 09/24/2019   Acute kidney injury (HCC) 06/12/2014   Orthostatic hypotension 06/12/2014   EKG abnormality 06/12/2014   Elevated blood pressure reading in office with diagnosis of hypertension 06/12/2014   HTN (hypertension) 04/12/2013   PCP:  Tanda Bleacher, MD Pharmacy:   [facility] [address] [place] - [address] [facility] [address] [place] [address] Phone: [phone] Fax: [phone]  [facility] - [address] [place] - [address] [place] [address] Phone: [phone] Fax: [phone]     Social Drivers of Health (SDOH) Social History: SDOH Screenings   Food Insecurity: No Food Insecurity (09/29/2023)  Housing: Low Risk  (09/29/2023)  Transportation Needs: No Transportation Needs (09/29/2023)  Utilities: Not At Risk (09/29/2023)  Alcohol Screen: Low Risk  (09/29/2023)  Depression (PHQ2-9): Low Risk  (02/22/2024)  Financial Resource Strain: Low Risk  (09/29/2023)  Physical Activity: Inactive (09/29/2023)  Social Connections: Moderately Integrated (09/29/2023)  Stress: No Stress Concern Present (09/29/2023)  Tobacco Use: High Risk (06/07/2024)  Health Literacy: Adequate Health Literacy (09/29/2023)   SDOH Interventions:     Readmission Risk Interventions     No data to display

## 2024-06-10 NOTE — Plan of Care (Signed)
  Problem: Education: Goal: Knowledge of disease or condition will improve 06/10/2024 1441 by Berkeley Verdie BRAVO, RN Outcome: Progressing 06/10/2024 1422 by Berkeley Verdie BRAVO, RN Outcome: Progressing   Problem: Ischemic Stroke/TIA Tissue Perfusion: Goal: Complications of ischemic stroke/TIA will be minimized Outcome: Progressing   Problem: Health Behavior/Discharge Planning: Goal: Goals will be collaboratively established with patient/family Outcome: Progressing   Problem: Nutrition: Goal: Risk of aspiration will decrease Outcome: Progressing Goal: Dietary intake will improve Outcome: Progressing   Problem: Education: Goal: Knowledge of General Education information will improve Description: Including pain rating scale, medication(s)/side effects and non-pharmacologic comfort measures Outcome: Progressing

## 2024-06-10 NOTE — Progress Notes (Signed)
 Occupational Therapy Treatment Patient Details Name: Ronald Lowe MRN: [id number] DOB: November 02, 1956 Today's Date: 06/10/2024   History of present illness Pt is a 68 y.o. male admitted 6/19. He presented to the ED with confusion, R side weakness, slurred speech, and L gaze. MRI revealed multiple acute infarcts in the L MCA and left PCA territories. PMH: daily alcohol use, HTN, a fib not on anticoagulation (secondary to cost per family, per chart review patient reported not liking how the medication made him feel), ongoing smoking, hep C   OT comments  Pt is making steady progress towards their acute OT goals. Pt completed with PT to safely address activity tolerance and functional transfers. He continues to be limited by impaired cognition, disoriented to time and situation despite constant education, weakness and poor midline awareness. Overall he required mod-max A +2 for bed mobility and mod A +2 for standing attempts. Max A needed at time for standing balance due to significant R lateral lean. L elbow propping in sitting did help his midline balance, however he continues to need significant cues for posture. Attempted side steps, pt required max A+2 with RW. OT to continue to follow acutely to facilitate progress towards established goals. Pt will continue to benefit from intensive inpatient follow up therapy, >3 hours/day after discharge.       If plan is discharge home, recommend the following:  A lot of help with walking and/or transfers;Two people to help with walking and/or transfers;A lot of help with bathing/dressing/bathroom;Two people to help with bathing/dressing/bathroom;Assistance with cooking/housework;Assistance with feeding;Direct supervision/assist for medications management;Direct supervision/assist for financial management;Assist for transportation;Help with stairs or ramp for entrance;Supervision due to cognitive status   Equipment Recommendations  BSC/3in1;Other (comment)     Recommendations for Other Services Rehab consult    Precautions / Restrictions Precautions Precautions: Fall;Other (comment) Recall of Precautions/Restrictions: Impaired Precaution/Restrictions Comments: posey belt Restrictions Weight Bearing Restrictions Per Provider Order: No       Mobility Bed Mobility Overal bed mobility: Needs Assistance Bed Mobility: Supine to Sit, Sit to Supine     Supine to sit: Mod assist, +2 for safety/equipment, +2 for physical assistance Sit to supine: Mod assist, +2 for physical assistance, +2 for safety/equipment   General bed mobility comments: significant cues needed    Transfers Overall transfer level: Needs assistance Equipment used: Rolling walker (2 wheels) Transfers: Sit to/from Stand Sit to Stand: Mod assist, +2 physical assistance, +2 safety/equipment           General transfer comment: R lateral lean     Balance Overall balance assessment: Needs assistance Sitting-balance support: Feet supported Sitting balance-Leahy Scale: Poor   Postural control: Right lateral lean   Standing balance-Leahy Scale: Poor Standing balance comment: mod A +2                           ADL either performed or assessed with clinical judgement   ADL Overall ADL's : Needs assistance/impaired     Grooming: Moderate assistance Grooming Details (indicate cue type and reason): unable to bring hand all the way to his face                 Toilet Transfer: Maximal assistance;+2 for physical assistance;+2 for safety/equipment Toilet Transfer Details (indicate cue type and reason): simualated at the EOB         Functional mobility during ADLs: Moderate assistance;+2 for physical assistance;+2 for safety/equipment;Rolling walker (2 wheels) General ADL Comments: limited  by impiared cognition, midline awareness and weakness. session focused on OOB transfers and midline posture    Extremity/Trunk Assessment Upper Extremity  Assessment Upper Extremity Assessment: RUE deficits/detail;LUE deficits/detail RUE Deficits / Details: Moving against gravity. He was unable to bring his hand to his nose adn required hand over hand to place hand on walker however he was able to sustain his grasp on the walker LUE Deficits / Details: per wife pt has chronic L shoulder limitations. he used LUE functionall during bed mobility, transfers and balance   Lower Extremity Assessment Lower Extremity Assessment: Defer to PT evaluation        Vision   Vision Assessment?: Vision impaired- to be further tested in functional context   Perception Perception Perception: Not tested   Praxis Praxis Praxis: Not tested   Communication Communication Communication: Impaired Factors Affecting Communication: Difficulty expressing self;Reduced clarity of speech   Cognition Arousal: Lethargic Behavior During Therapy: Restless Cognition: Cognition impaired   Orientation impairments: Time, Situation Awareness: Intellectual awareness impaired Memory impairment (select all impairments): Short-term memory Attention impairment (select first level of impairment): Sustained attention Executive functioning impairment (select all impairments): Initiation, Organization, Reasoning, Problem solving, Sequencing OT - Cognition Comments: pt able to state his name and the hospital. He was not oriented to year, month or situation adn could not recall after education with any delay despite multiple attempts. he followed most simple 1 step commands. Little to no insight into deficits and safety. Pt's wife present and reports that his cognition has been declining for over a month, she atteibuted it to his drinking.                 Following commands: Impaired        Cueing   Cueing Techniques: Verbal cues, Gestural cues, Tactile cues        General Comments VSS, wife present    Pertinent Vitals/ Pain       Pain Assessment Pain Assessment:  Faces Faces Pain Scale: Hurts a little bit Pain Location: generalized with movement Pain Descriptors / Indicators: Discomfort, Grimacing, Guarding, Moaning Pain Intervention(s): Limited activity within patient's tolerance, Monitored during session  Home Living Family/patient expects to be discharged to:: Private residence Living Arrangements: Spouse/significant other Available Help at Discharge: Family;Available 24 hours/day Type of Home: House Home Access: Stairs to enter [employer] of Steps: 2 Entrance Stairs-Rails: Right;Left                                Frequency  Min 2X/week        Progress Toward Goals  OT Goals(current goals can now be found in the care plan section)  Progress towards OT goals: Progressing toward goals  Acute Rehab OT Goals Patient Stated Goal: to get better per wife OT Goal Formulation: With patient Time For Goal Achievement: 06/21/24 Potential to Achieve Goals: Good ADL Goals Pt Will Perform Grooming: with set-up Pt Will Perform Upper Body Dressing: with set-up Pt Will Perform Lower Body Dressing: with mod assist;sit to/from stand Pt Will Transfer to Toilet: with mod assist;bedside commode;stand pivot transfer Additional ADL Goal #1: Pt will follow simple 1 step commands 75% of the time during ADLs  Plan      Co-evaluation    PT/OT/SLP Co-Evaluation/Treatment: Yes     OT goals addressed during session: ADL's and self-care      AM-PAC OT 6 Clicks Daily Activity     Outcome Measure  Help from another person eating meals?: A Little Help from another person taking care of personal grooming?: A Lot Help from another person toileting, which includes using toliet, bedpan, or urinal?: Total Help from another person bathing (including washing, rinsing, drying)?: A Lot Help from another person to put on and taking off regular upper body clothing?: A Lot Help from another person to put on and taking off regular  lower body clothing?: Total 6 Click Score: 11    End of Session Equipment Utilized During Treatment: Gait belt;Rolling walker (2 wheels)  OT Visit Diagnosis: Unsteadiness on feet (R26.81);Other abnormalities of gait and mobility (R26.89);Muscle weakness (generalized) (M62.81);Hemiplegia and hemiparesis Hemiplegia - Right/Left: Right Hemiplegia - dominant/non-dominant: Dominant Hemiplegia - caused by: Cerebral infarction   Activity Tolerance Patient tolerated treatment well   Patient Left in bed;with call bell/phone within reach;with bed alarm set;with family/visitor present   Nurse Communication Mobility status        Time: 1420-1450 OT Time Calculation (min): 30 min  Charges: OT General Charges $OT Visit: 1 Visit OT Treatments $Self Care/Home Management : 8-22 mins  Lucie Kendall, OTR/L Acute Rehabilitation Services Office [phone] Secure Chat Communication Preferred   Lucie JONETTA Kendall 06/10/2024, 3:05 PM

## 2024-06-10 NOTE — Progress Notes (Addendum)
 PROGRESS NOTE    Ronald Lowe  FMW:[id number] DOB: 1956/06/02 DOA: 06/06/2024 PCP: Tanda Bleacher, MD    Brief Narrative:   Ronald Lowe is a 68 y.o. male with past medical history significant for HTN, HLD, paroxysmal atrial fibrillation noncompliant with anticoagulation, GERD, tobacco use disorder, CKD3a, COPD, EtOH use disorder who presented to [facility] ED via EMS after family found him sitting on the floor confused with right arm weakness.  LKN was 06/06/24 at 7:30 AM when he left for work.  History apparently obtained from review of chart and family as he is unable to precipitate Foley.  Family notes that he has been having frequent episodes of confusion and they have been concerned about dementia versus the confusion being related to his heavy drinking.  Spouse reports 4-5 vodka drinks daily.  In the ED, temperature 99.6 F, BP 205/95, HR 109, RR 22, SpO2 99% on room air.  WBC 7.5, hemoglobin 17.0, platelet count 209.  Sodium 138, potassium 3.7, chloride 103, CO2 23, glucose 98, BUN 16, creatinine 1.53.  AST 37, ALT 22, total bilirubin 2.0. CT head obtained and shows hypoattenuation in the left anterior insular region concerning for acute infarct and chronic microvascular ischemic disease.  MRI obtained for better characterization of CT head findings.  MRI showed occluded proximal left M3 MCA, nearly occlusive stenosis of the left carotid bifurcation, 55% stenosis of the right ICA, and right V2 vertebral stenosis secondary to compression from adjacent osteophyte.  CXR obtained and negative for acute abnormalities.  He was given Ativan  in the ED.  Neurology consulted as part of code stroke and [facility] later contacted for admission.  Assessment & Plan:   Acute multifocal CVA Patient presenting after being found altered, with right-sided arm weakness, aphasia with last known normal 7:30 AM on 06/06/2024.  Complicated by his heavy alcohol abuse, tobacco use and noncompliance with anticoagulation.  MR brain  without contrast with multiple acute infarcts left MCA, left PCA, left basal ganglia, left posterior limb of the internal capsule, left anterior insula, overlying left frontal and parietal lobes of the left occipital lobe without mass effect.  CT angiogram head/neck with occluded proximal left M3 MCA, nearly occlusive stenosis left carotid bifurcation, 55% stenosis right ICA, moderate V2 vertebral artery stenosis due to compression from adjacent osteophyte.  UDS positive for THC, cocaine, benzodiazepines, amphetamines.  Hemoglobin A1c 4.2.  LDL 66.  Carotid ultrasound with 60 to 79% stenosis left ICA.  TTE with LVEF 60 to 65%, LV with no regional wall motion normalities, grade 1 diastolic dysfunction, small pericardial effusion without evidence of tamponade, trivial MR, no aortic stenosis. -- Neurology following, appreciate assistance -- Seen by SLP> dysphagia 2 diet, assist with meals; aspiration precautions -- PT/OT evaluation: Recommend CIR admission -- Allow permissive hypertension, goal BP <220/120 given intracranial stenosis -- DAPT with aspirin  81 mg p.o. daily, Plavix  75 mg p.o. daily  Left ICA stenosis Carotid ultrasound with 60 to 79% stenosis left ICA. -- Consideration for revascularization per neurology; consulting vascular surgery today  Hypomagnesemia Hypokalemia Potassium 3.3, magnesium  1.4, will replete. -- Repeat electrolytes in a.m.  EtOH use disorder with withdrawal Spouse endorses patient drinks 4-5 vodka drinks daily. -- CIWA protocol with as needed Ativan  -- Thiamine , folate, multivitamin  Acute kidney injury on chronic kidney disease: Resolved Unclear baseline creatinine. Notes from 2022 and 2023 noted baseline creatinine of 1.9 however most recent readings from 2022 and 2023 in [facility] show creatinine of 0.94-1.2. -- Cr 1.53>1.09 -- IVF  with LR at 75 mL/h -- BMP in the a.m.  Right shoulder/forearm pain Patient grimaces with flexion/extension at (R) elbow and palpation  of forearm.  X-ray of right forearm/shoulder negative for fracture or dislocation.  Right upper from edema Etiology likely secondary to IV infiltration. Vascular duplex ultrasound RUE: Negative for DVT   Hypertension -- Allow permissive hypertension with goal BP <220/120 -- Labetalol  10 mg IV q2h PRN SBP > 220, DBP > 120    Paroxysmal Atrial Fibrillation Noncompliant with outpatient anticoagulation and Coreg    Hyperlipidemia Noncompliant with outpatient statin.  Lipid panel with total cholesterol 141, HDL 59, LDL 66, triglycerides 79. -- Restart statin once demonstrates improved oral intake   COPD -- Pulmicort  neb twice daily -- Brovana  neb twice daily -- Yupelri  neb daily -- Albuterol  neb every 4 hours.  Wheezing/shortness of breath  GERD -- Protonix  40 mg IV every 24 hours   Tobacco Use Disorder -- Nicotine  patch   Illicit substance abuse UDS positive for THC, cocaine, benzodiazepines, and feta means.     DVT prophylaxis: enoxaparin  (LOVENOX ) injection 40 mg Start: 06/07/24 1800    Code Status: Full Code Family Communication: Updated spouse present at bedside this morning  Disposition Plan:  Level of care: Telemetry Medical Status is: Inpatient Remains inpatient appropriate because: Continues with EtOH withdrawal, improving, will need placement    Consultants:  Neurology Vascular surgery  Procedures:  Carotid ultrasound TTE:  Vascular duplex ultrasound right upper extremity  Antimicrobials:  None   Subjective: Patient seen examined bedside, lying in bed.  Remains in restraints.  Alert but with some mumbling speech.  CIWA scores improved.  No family present at bedside.  Requesting something to drink, states mouth is dry.   Unable to obtain any further ROS due to his incoherent speech.  No acute concerns overnight per nursing staff.  Objective: Vitals:   06/09/24 2324 06/10/24 0349 06/10/24 0757 06/10/24 0854  BP: (!) 154/96 (!) 170/105    Pulse: (!)  114 (!) 111 (P) 99 99  Resp: 18 16  16   Temp: 99.1 F (37.3 C) 98.8 F (37.1 C) (P) 98.9 F (37.2 C)   TempSrc: Oral Oral (P) Axillary   SpO2: 96% 96% (P) 95% 95%  Weight:      Height:        Intake/Output Summary (Last 24 hours) at 06/10/2024 0944 Last data filed at 06/09/2024 1703 Gross per 24 hour  Intake 606.68 ml  Output --  Net 606.68 ml   Filed Weights   06/06/24 2200 06/06/24 2236  Weight: 68.8 kg 68.8 kg    Examination:  Physical Exam: GEN: NAD, alert, chronically ill in appearance, appears older than stated age HEENT: NCAT, PERRL, EOMI, sclera clear, dry mucous membranes PULM: CTAB w/o wheezes/crackles, normal respiratory effort, on room air CV: RRR w/o M/G/R GI: abd soft, NTND, + BS MSK: no peripheral edema NEURO: Moves all extremities with preserved muscle strength, + dysarthria Integumentary: No concerning rashes/lesions/wounds noted to exposed skin surfaces    Data Reviewed: I have personally reviewed following labs and imaging studies  CBC: Recent Labs  Lab 06/06/24 2212 06/07/24 0836 06/08/24 0653 06/10/24 0456  WBC 7.5 10.0 9.0 6.1  NEUTROABS 5.9  --   --   --   HGB 17.0  17.0 17.1* 16.8 14.2  HCT 47.6  50.0 48.2 46.4 39.2  MCV 104.2* 105.0* 103.3* 102.6*  PLT 209 206 178 193   Basic Metabolic Panel: Recent Labs  Lab 06/06/24 2212  06/07/24 0836 06/08/24 0653 06/09/24 0521 06/10/24 0456  NA 138  138 139 139 137 137  K 3.7  4.2 3.7 3.5 3.5 3.3*  CL 103  105 104 106 108 109  CO2 23 21* 15* 15* 16*  GLUCOSE 98  93 80 75 76 84  BUN 16  21 15 14 12 12   CREATININE 1.53*  1.50* 1.19 1.07 1.04 1.09  CALCIUM  9.5 9.4 8.9 8.6* 8.7*  MG  --   --  1.5*  --  1.4*  PHOS  --   --   --   --  3.2   GFR: Estimated Creatinine Clearance: 62.2 mL/min (by C-G formula based on SCr of 1.09 mg/dL). Liver Function Tests: Recent Labs  Lab 06/06/24 2212 06/08/24 0653 06/09/24 0521 06/10/24 0456  AST 37 40 36 38  ALT 22 20 19 20   ALKPHOS 58  54 52 44  BILITOT 2.0* 2.2* 1.7* 1.7*  PROT 7.1 6.4* 6.4* 5.7*  ALBUMIN 4.3 3.7 3.7 3.2*   No results for input(s): LIPASE, AMYLASE in the last 168 hours. No results for input(s): AMMONIA in the last 168 hours. Coagulation Profile: Recent Labs  Lab 06/06/24 2212  INR 1.0   Cardiac Enzymes: No results for input(s): CKTOTAL, CKMB, CKMBINDEX, TROPONINI in the last 168 hours. BNP (last 3 results) No results for input(s): PROBNP in the last 8760 hours. HbA1C: No results for input(s): HGBA1C in the last 72 hours.  CBG: Recent Labs  Lab 06/06/24 2207  GLUCAP 95   Lipid Profile: No results for input(s): CHOL, HDL, LDLCALC, TRIG, CHOLHDL, LDLDIRECT in the last 72 hours.  Thyroid  Function Tests: No results for input(s): TSH, T4TOTAL, FREET4, T3FREE, THYROIDAB in the last 72 hours. Anemia Panel: No results for input(s): VITAMINB12, FOLATE, FERRITIN, TIBC, IRON, RETICCTPCT in the last 72 hours.  Sepsis Labs: No results for input(s): PROCALCITON, LATICACIDVEN in the last 168 hours.  No results found for this or any previous visit (from the past 240 hours).       Radiology Studies: VAS US  UPPER EXTREMITY VENOUS DUPLEX Result Date: 06/10/2024 UPPER VENOUS STUDY  Patient Name:  JEP DYAS  Date of Exam:   06/09/2024 Medical Rec #: [id number]       Accession #:    [id number] Date of Birth: [birth year]       Patient Gender: M Patient Age:   79 years Exam Location:  [facility] Procedure:      VAS US  UPPER EXTREMITY VENOUS DUPLEX Referring Phys: ARY XU --------------------------------------------------------------------------------  Indications: Swelling Risk Factors: None identified. Comparison Study: No prior studies. Performing Technologist: Cordella Collet RVT  Examination Guidelines: A complete evaluation includes B-mode imaging, spectral Doppler, color Doppler, and power Doppler as needed of all accessible portions of  each vessel. Bilateral testing is considered an integral part of a complete examination. Limited examinations for reoccurring indications may be performed as noted.  Right Findings: +----------+------------+---------+-----------+----------+-------+ RIGHT     CompressiblePhasicitySpontaneousPropertiesSummary +----------+------------+---------+-----------+----------+-------+ IJV           Full       Yes       Yes                      +----------+------------+---------+-----------+----------+-------+ Subclavian               Yes       Yes                      +----------+------------+---------+-----------+----------+-------+  Axillary      Full       Yes       Yes                      +----------+------------+---------+-----------+----------+-------+ Brachial      Full                                          +----------+------------+---------+-----------+----------+-------+ Radial        Full                                          +----------+------------+---------+-----------+----------+-------+ Ulnar         Full                                          +----------+------------+---------+-----------+----------+-------+ Cephalic      Full                                          +----------+------------+---------+-----------+----------+-------+ Basilic       Full                                          +----------+------------+---------+-----------+----------+-------+  Left Findings: +----------+------------+---------+-----------+----------+-------+ LEFT      CompressiblePhasicitySpontaneousPropertiesSummary +----------+------------+---------+-----------+----------+-------+ Subclavian               Yes       Yes                      +----------+------------+---------+-----------+----------+-------+  Summary:  Right: No evidence of deep vein thrombosis in the upper extremity. No evidence of superficial vein thrombosis in the upper  extremity.  Left: No evidence of thrombosis in the subclavian.  *See table(s) above for measurements and observations.  Diagnosing physician: Lonni Gaskins MD Electronically signed by Lonni Gaskins MD on 06/10/2024 at 8:20:12 AM.    Final    ECHOCARDIOGRAM COMPLETE Result Date: 06/09/2024    ECHOCARDIOGRAM REPORT   Patient Name:   ANTERIO SCHEEL Date of Exam: 06/09/2024 Medical Rec #:  [id number]      Height:       72.0 in Accession #:    [id number]     Weight:       151.7 lb Date of Birth:  Apr 02, 1956      BSA:          1.894 m Patient Age:    67 years       BP:           98/73 mmHg Patient Gender: M              HR:           85 bpm. Exam Location:  Inpatient Procedure: 2D Echo, Cardiac Doppler and Color Doppler (Both Spectral and Color            Flow Doppler were utilized during procedure). Indications:    Stroke I63.9  History:  Patient has no prior history of Echocardiogram examinations.                 Stroke and COPD, Arrythmias:Atrial Fibrillation,                 Signs/Symptoms:Hypotension; Risk Factors:Hypertension,                 Dyslipidemia and Current Smoker.  Sonographer:    Thea Norlander RCS Referring Phys: ERIN C LEHNER  Sonographer Comments: Image acquisition challenging due to uncooperative patient. IMPRESSIONS  1. Left ventricular ejection fraction, by estimation, is 60 to 65%. The left ventricle has normal function. The left ventricle has no regional wall motion abnormalities. There is severe asymmetric left ventricular hypertrophy of the septal segment. Left  ventricular diastolic parameters are consistent with Grade I diastolic dysfunction (impaired relaxation).  2. Right ventricular systolic function is normal. The right ventricular size is normal.  3. A small pericardial effusion is present. The pericardial effusion is localized near the right ventricle. There is no evidence of cardiac tamponade.  4. The mitral valve is abnormal. Trivial mitral valve regurgitation.  5. The  aortic valve was not well visualized. Aortic valve regurgitation is not visualized. Aortic valve sclerosis/calcification is present, without any evidence of aortic stenosis. Comparison(s): No prior Echocardiogram. FINDINGS  Left Ventricle: Left ventricular ejection fraction, by estimation, is 60 to 65%. The left ventricle has normal function. The left ventricle has no regional wall motion abnormalities. The left ventricular internal cavity size was normal in size. There is  severe asymmetric left ventricular hypertrophy of the septal segment. Left ventricular diastolic parameters are consistent with Grade I diastolic dysfunction (impaired relaxation). Indeterminate filling pressures. Right Ventricle: The right ventricular size is normal. No increase in right ventricular wall thickness. Right ventricular systolic function is normal. Left Atrium: Left atrial size was normal in size. Right Atrium: Right atrial size was normal in size. Pericardium: A small pericardial effusion is present. The pericardial effusion is localized near the right ventricle. There is no evidence of cardiac tamponade. Mitral Valve: The mitral valve is abnormal. Mild mitral annular calcification. Trivial mitral valve regurgitation. Tricuspid Valve: The tricuspid valve is not well visualized. Tricuspid valve regurgitation is not demonstrated. Aortic Valve: The aortic valve was not well visualized. Aortic valve regurgitation is not visualized. Aortic valve sclerosis/calcification is present, without any evidence of aortic stenosis. Aortic valve peak gradient measures 8.8 mmHg. Pulmonic Valve: The pulmonic valve was not well visualized. Pulmonic valve regurgitation is not visualized. Aorta: The aortic root and ascending aorta are structurally normal, with no evidence of dilitation. IAS/Shunts: The interatrial septum was not well visualized.  LEFT VENTRICLE PLAX 2D LVIDd:         3.00 cm Diastology LVIDs:         2.10 cm LV e' medial:    5.00 cm/s  LV PW:         1.10 cm LV E/e' medial:  11.9 LV IVS:        1.70 cm LV e' lateral:   8.49 cm/s                        LV E/e' lateral: 7.0  RIGHT VENTRICLE RV S prime:     18.60 cm/s TAPSE (M-mode): 2.7 cm LEFT ATRIUM             Index        RIGHT ATRIUM  Index LA diam:        3.60 cm 1.90 cm/m   RA Area:     13.30 cm LA Vol (A2C):   43.8 ml 23.12 ml/m  RA Volume:   26.30 ml  13.88 ml/m LA Vol (A4C):   54.0 ml 28.51 ml/m LA Biplane Vol: 52.1 ml 27.50 ml/m  AORTIC VALVE AV Vmax:      148.00 cm/s AV Peak Grad: 8.8 mmHg LVOT Vmax:    98.07 cm/s LVOT Vmean:   65.167 cm/s LVOT VTI:     0.154 m MITRAL VALVE MV Area (PHT): 2.50 cm    SHUNTS MV Decel Time: 303 msec    Systemic VTI: 0.15 m MV E velocity: 59.70 cm/s MV A velocity: 84.40 cm/s MV E/A ratio:  0.71 Vinie Maxcy MD Electronically signed by Vinie Maxcy MD Signature Date/Time: 06/09/2024/4:12:17 PM    Final         Scheduled Meds:  arformoterol   15 mcg Nebulization BID   aspirin   300 mg Rectal Daily   Or   aspirin   325 mg Oral Daily   budesonide  (PULMICORT ) nebulizer solution  0.5 mg Nebulization BID   clopidogrel   75 mg Oral Daily   vitamin B-12  1,000 mcg Oral Daily   enoxaparin  (LOVENOX ) injection  40 mg Subcutaneous Q24H   folic acid   1 mg Oral Daily   LORazepam   1 mg Intravenous Once   multivitamin with minerals  1 tablet Oral Daily   nicotine   14 mg Transdermal Daily   pantoprazole  (PROTONIX ) IV  40 mg Intravenous Q24H   revefenacin   175 mcg Nebulization Daily   [START ON 06/15/2024] thiamine  (VITAMIN B1) injection  100 mg Intravenous Daily   Continuous Infusions:  lactated ringers  75 mL/hr at 06/10/24 0747   magnesium  sulfate bolus IVPB 4 g (06/10/24 0751)   potassium chloride  10 mEq (06/10/24 0902)   thiamine  (VITAMIN B1) injection Stopped (06/09/24 0923)     LOS: 3 days    Time spent: 48 minutes spent on 06/10/2024 caring for this patient face-to-face including chart review, ordering labs/tests,  documenting, discussion with nursing staff, consultants, updating family and interview/physical exam    Camellia PARAS [place], DO [facility] Available via Epic secure chat 7am-7pm After these hours, please refer to coverage provider listed on [email or web address] 06/10/2024, 9:44 AM

## 2024-06-10 NOTE — Plan of Care (Signed)
  Problem: Education: Goal: Knowledge of disease or condition will improve Outcome: Progressing   Problem: Ischemic Stroke/TIA Tissue Perfusion: Goal: Complications of ischemic stroke/TIA will be minimized Outcome: Progressing   Problem: Health Behavior/Discharge Planning: Goal: Goals will be collaboratively established with patient/family Outcome: Progressing   Problem: Nutrition: Goal: Risk of aspiration will decrease Outcome: Progressing Goal: Dietary intake will improve Outcome: Progressing   Problem: Education: Goal: Knowledge of General Education information will improve Description: Including pain rating scale, medication(s)/side effects and non-pharmacologic comfort measures Outcome: Progressing

## 2024-06-10 NOTE — Progress Notes (Signed)
 Patient is STROKE TEAM PROGRESS NOTE    INTERIM HISTORY/SUBJECTIVE Wife is at the bedside. Pt awake alert, calm, lying in bed, orientated to self, place and told me age 68 instead of 89. Seems to have right HH, but moving all extremities. Will consult [facility].   OBJECTIVE  CBC    Component Value Date/Time   WBC 6.1 06/10/2024 0456   RBC 3.82 (L) 06/10/2024 0456   HGB 14.2 06/10/2024 0456   HGB 15.8 07/01/2022 1039   HCT 39.2 06/10/2024 0456   HCT 45.1 07/01/2022 1039   PLT 193 06/10/2024 0456   PLT 280 07/01/2022 1039   MCV 102.6 (H) 06/10/2024 0456   MCV 103 (H) 07/01/2022 1039   MCH 37.2 (H) 06/10/2024 0456   MCHC 36.2 (H) 06/10/2024 0456   RDW 14.1 06/10/2024 0456   RDW 13.7 07/01/2022 1039   LYMPHSABS 0.8 06/06/2024 2212   LYMPHSABS 0.8 07/01/2022 1039   MONOABS 0.7 06/06/2024 2212   EOSABS 0.0 06/06/2024 2212   EOSABS 0.2 07/01/2022 1039   BASOSABS 0.1 06/06/2024 2212   BASOSABS 0.1 07/01/2022 1039    BMET    Component Value Date/Time   NA 137 06/10/2024 0456   NA 133 (L) 07/01/2022 1039   K 3.3 (L) 06/10/2024 0456   CL 109 06/10/2024 0456   CO2 16 (L) 06/10/2024 0456   GLUCOSE 84 06/10/2024 0456   BUN 12 06/10/2024 0456   BUN 7 (L) 07/01/2022 1039   CREATININE 1.09 06/10/2024 0456   CALCIUM  8.7 (L) 06/10/2024 0456   EGFR 89 07/01/2022 1039   GFRNONAA >60 06/10/2024 0456    IMAGING past 24 hours ECHOCARDIOGRAM COMPLETE Result Date: 06/09/2024    ECHOCARDIOGRAM REPORT   Patient Name:   Ronald Lowe Date of Exam: 06/09/2024 Medical Rec #:  [id number]      Height:       72.0 in Accession #:    [id number]     Weight:       151.7 lb Date of Birth:  Apr 21, 1956      BSA:          1.894 m Patient Age:    67 years       BP:           98/73 mmHg Patient Gender: M              HR:           85 bpm. Exam Location:  Inpatient Procedure: 2D Echo, Cardiac Doppler and Color Doppler (Both Spectral and Color            Flow Doppler were utilized during procedure). Indications:     Stroke I63.9  History:        Patient has no prior history of Echocardiogram examinations.                 Stroke and COPD, Arrythmias:Atrial Fibrillation,                 Signs/Symptoms:Hypotension; Risk Factors:Hypertension,                 Dyslipidemia and Current Smoker.  Sonographer:    Thea Norlander RCS Referring Phys: ERIN C LEHNER  Sonographer Comments: Image acquisition challenging due to uncooperative patient. IMPRESSIONS  1. Left ventricular ejection fraction, by estimation, is 60 to 65%. The left ventricle has normal function. The left ventricle has no regional wall motion abnormalities. There is severe asymmetric left ventricular hypertrophy of the septal segment.  Left  ventricular diastolic parameters are consistent with Grade I diastolic dysfunction (impaired relaxation).  2. Right ventricular systolic function is normal. The right ventricular size is normal.  3. A small pericardial effusion is present. The pericardial effusion is localized near the right ventricle. There is no evidence of cardiac tamponade.  4. The mitral valve is abnormal. Trivial mitral valve regurgitation.  5. The aortic valve was not well visualized. Aortic valve regurgitation is not visualized. Aortic valve sclerosis/calcification is present, without any evidence of aortic stenosis. Comparison(s): No prior Echocardiogram. FINDINGS  Left Ventricle: Left ventricular ejection fraction, by estimation, is 60 to 65%. The left ventricle has normal function. The left ventricle has no regional wall motion abnormalities. The left ventricular internal cavity size was normal in size. There is  severe asymmetric left ventricular hypertrophy of the septal segment. Left ventricular diastolic parameters are consistent with Grade I diastolic dysfunction (impaired relaxation). Indeterminate filling pressures. Right Ventricle: The right ventricular size is normal. No increase in right ventricular wall thickness. Right ventricular systolic  function is normal. Left Atrium: Left atrial size was normal in size. Right Atrium: Right atrial size was normal in size. Pericardium: A small pericardial effusion is present. The pericardial effusion is localized near the right ventricle. There is no evidence of cardiac tamponade. Mitral Valve: The mitral valve is abnormal. Mild mitral annular calcification. Trivial mitral valve regurgitation. Tricuspid Valve: The tricuspid valve is not well visualized. Tricuspid valve regurgitation is not demonstrated. Aortic Valve: The aortic valve was not well visualized. Aortic valve regurgitation is not visualized. Aortic valve sclerosis/calcification is present, without any evidence of aortic stenosis. Aortic valve peak gradient measures 8.8 mmHg. Pulmonic Valve: The pulmonic valve was not well visualized. Pulmonic valve regurgitation is not visualized. Aorta: The aortic root and ascending aorta are structurally normal, with no evidence of dilitation. IAS/Shunts: The interatrial septum was not well visualized.  LEFT VENTRICLE PLAX 2D LVIDd:         3.00 cm Diastology LVIDs:         2.10 cm LV e' medial:    5.00 cm/s LV PW:         1.10 cm LV E/e' medial:  11.9 LV IVS:        1.70 cm LV e' lateral:   8.49 cm/s                        LV E/e' lateral: 7.0  RIGHT VENTRICLE RV S prime:     18.60 cm/s TAPSE (M-mode): 2.7 cm LEFT ATRIUM             Index        RIGHT ATRIUM           Index LA diam:        3.60 cm 1.90 cm/m   RA Area:     13.30 cm LA Vol (A2C):   43.8 ml 23.12 ml/m  RA Volume:   26.30 ml  13.88 ml/m LA Vol (A4C):   54.0 ml 28.51 ml/m LA Biplane Vol: 52.1 ml 27.50 ml/m  AORTIC VALVE AV Vmax:      148.00 cm/s AV Peak Grad: 8.8 mmHg LVOT Vmax:    98.07 cm/s LVOT Vmean:   65.167 cm/s LVOT VTI:     0.154 m MITRAL VALVE MV Area (PHT): 2.50 cm    SHUNTS MV Decel Time: 303 msec    Systemic VTI: 0.15 m MV E velocity: 59.70 cm/s MV A velocity:  84.40 cm/s MV E/A ratio:  0.71 Vinie Maxcy MD Electronically signed by  Vinie Maxcy MD Signature Date/Time: 06/09/2024/4:12:17 PM    Final    ECHOCARDIOGRAM COMPLETE Result Date: 06/07/2024    ECHOCARDIOGRAM REPORT   Patient Name:   Ronald Lowe Date of Exam: 06/07/2024 Medical Rec #:  [id number]      Height:       72.0 in Accession #:    [id number]     Weight:       151.7 lb Date of Birth:  03/06/1956      BSA:          1.894 m Patient Age:    67 years       BP:           11/11 mmHg Patient Gender: M              HR:           1 bpm. Exam Location:  Inpatient Procedure: 2D Echo (Both Spectral and Color Flow Doppler were utilized during            procedure). Indications:    stroke  History:        Patient has no prior history of Echocardiogram examinations.  Sonographer:    Koleen Popper Referring Phys: [id number] SRISHTI L BHAGAT IMPRESSIONS  1. Echo not perfomred due to patient non compliance.  2. Left ventricular ejection fraction, by estimation, is Not done%. The left ventricle has not done function. Left ventricular endocardial border not optimally defined to evaluate regional wall motion. The left ventricular internal cavity size was not done. Left ventricular diastolic function could not be evaluated.  3. Right ventricular systolic function was not well visualized. The right ventricular size is not well visualized.  4. The mitral valve was not assessed. No evidence of mitral valve regurgitation.  5. The aortic valve was not assessed. Aortic valve regurgitation is not visualized.  6. Aortic Not imaged. FINDINGS  Left Ventricle: Left ventricular ejection fraction, by estimation, is Not done%. The left ventricle has not done function. Left ventricular endocardial border not optimally defined to evaluate regional wall motion. Strain was performed and the global longitudinal strain is indeterminate. The left ventricular internal cavity size was not done. Suboptimal image quality limits for assessment of left ventricular hypertrophy. Left ventricular diastolic function could not be  evaluated. Right Ventricle: The right ventricular size is not well visualized. Right vetricular wall thickness was not assessed. Right ventricular systolic function was not well visualized. Left Atrium: Left atrial size was not assessed. Right Atrium: Right atrial size was not assessed. Pericardium: The pericardium was not assessed. Mitral Valve: The mitral valve was not assessed. No evidence of mitral valve regurgitation. Tricuspid Valve: The tricuspid valve is not assessed. Tricuspid valve regurgitation is not demonstrated. Aortic Valve: The aortic valve was not assessed. Aortic valve regurgitation is not visualized. Pulmonic Valve: The pulmonic valve was not assessed. Pulmonic valve regurgitation is not visualized. Aorta: Not imaged and the ascending aorta was not well visualized. IAS/Shunts: The interatrial septum was not assessed. Additional Comments: Echo not perfomred due to patient non compliance. 3D was performed not requiring image post processing on an independent workstation and was indeterminate. Maude Emmer MD Electronically signed by Maude Emmer MD Signature Date/Time: 06/07/2024/3:38:19 PM    Final      Vitals:   06/10/24 0349 06/10/24 0757 06/10/24 0854 06/10/24 1218  BP: (!) 170/105     Pulse: (!) 111 99 99  100  Resp: 16 16 16    Temp: 98.8 F (37.1 C) 98.9 F (37.2 C)  98.7 F (37.1 C)  TempSrc: Oral Axillary    SpO2: 96% 95% 95% 96%  Weight:      Height:        PHYSICAL EXAM General: thin built, well developed, not in acute distress CV: Regular rate and rhythm on monitor Respiratory:  Regular, unlabored respirations on room air  NEURO:  Neuro - awake, alert, eyes open, orientated to place and self, told me age 78 instead of 40, not orientated to time. No aphasia but paucity of speech, answer questions with short phrases, moderate dysarthria, following all simple commands. Able to name 2/4 and repeat in dysarthric voices. No gaze palsy, tracking bilaterally, visual field  exam showed possible right hemianopia. Mild right facial droop. Tongue midline. Bilateral UEs 3+/5, no drift, distal 4+/5. Bilaterally LEs 3+/5 proximal and 4/5 distally. Sensation symmetrical bilaterally subjectively, b/l FTN intact but slow, gait not tested.     ASSESSMENT/PLAN  Ronald Lowe is a 68 y.o. male with history of atrial fibrillation not on anticoagulation, hypertension, hyperlipidemia, alcohol abuse, ongoing smoking, cervical spine degenerative disc disease who was admitted for acute stroke, potentially cardioembolic from A-fib. NIH on Admission: 9.  Stroke:  left MCA scattered infarcts, etiology likely due to symptomatic proximal left ICA stenosis CT Head without contrast Subtle hypoattenuation in the anterior left insula  CT angio Head and Neck Occluded proximal left M3 MCA. Critical, nearly occlusive stenosis at the left carotid bifurcation, at least 80%. Approximately 55% stenosis of the right ICA. Moderate right V2 vertebral artery stenosis due to compression from adjacent osteophyte. MRI Brain Multiple acute infarcts in the left MCA territories, including the left basal ganglia, left posterior limb of the internal capsule, left anterior insula, overlying left frontal and parietal lobes and the left occipital lobe. No mass effect. Carotid Doppler L ICA 60-79% stenosis 2D Echo EF 60-65%, severe asymmetric left ventricular hypertrophy of the septal segment.  LDL 66 HgbA1c 4.2 UDS + cocaine, amphetamine, THC, benzo VTE prophylaxis - lovenox  No antithrombotic prior to admission, now on DAPT with ASA 81mg  and Plavix . Further regimen per [facility] Therapy recommendations:  CIR Disposition:  pending  Carotid stenosis CT angio Head and Neck critical, nearly occlusive stenosis at the left carotid bifurcation, at least 80%. Approximately 55% stenosis of the right ICA. Symptomatic this time On DAPT Will consult [facility]  Atrial fibrillation Home Meds: no OAC due to cost per family,  noted on chart that patient does not like how it makes him feel.  Continue telemetry monitoring Now on DAPT  Not good candidate for [facility] due to alcohol and substance abuse, fall risk and noncompliance  Hypertension Home meds: Lisinopril  5 mg Stable but fluctuate Avoid hypotension due to intracranial stenosis, BP goal 130-160 prior to carotid intervention Long term BP goal normotensive  Hyperlipidemia Home meds:  none LDL 66, goal < 70 High intensity statin not indicated due to LDL within goal and no po access for now  Alcohol withdraw B12 deficiency  Intermittent agitation  On CIWA protocol On B1/FA/MVI B12 = 180, supplement B12 1000mcg daily  Tobacco Abuse Patient smokes 0.25 packs per day for 53 years, smokes marijuana Cessation education provided Nicotine  replacement therapy provided Pt is willing to quit  Substance Abuse UDS positive for THC, cocaine, benzos, amphetamines Cessation education provided [facility] consult for cessation placed Pt agreed to quit  Dysphagia Patient has post-stroke dysphagia, SLP  consulted Now on dys2 and thin liquid Advance diet as tolerated  Other Stroke Risk Factors Advanced age Family hx stroke (mother)  Other Active Problems History of medication noncompliance RUE swollen - UE venous doppler no DVT or SVT  Hospital day # 3   Ary Cummins, MD PhD Stroke Neurology 06/10/2024 1:45 PM

## 2024-06-10 NOTE — Consult Note (Addendum)
 Hospital Consult    Reason for Consult:  Left ICA stenosis  Requesting Physician:  Dr. [place] MRN #:  [id number]  History of Present Illness: This is a 68 y.o. male with past medical history of HTN, HLD, atrial fibrillation non compliant with anticoagulation, GERD, CKD, COPD and poly substance abuse who presented with confusion and right arm weakness. Patient and his wife are not great historians so some of history obtained from chart. He apparently was found in a confused state sitting on the floor. He presented outside of window for TNK.  Supposedly he has had recent episodes of confusion however with his history of heavy alcohol use hard to know if confusion vs related to his drinking. Wife explains that since his left shoulder surgery he does not have full ROM of left arm but does not note any right upper extremity weakness. Pt denies any numbness or weakness of either upper or lower extremity, no visual changes, or slurred speech. On admission MR brain without contrast shows multiple acute infarcts left MCA, left PCA, left basal ganglia, left posterior limb of the internal capsule, left anterior insula, overlying left frontal and parietal lobes of the left occipital lobe without mass effect. CT angiogram head/neck with occluded proximal left M3 MCA, nearly occlusive stenosis left carotid bifurcation of at least 80%, 55% stenosis right ICA, moderate V2 vertebral artery stenosis due to compression from adjacent osteophyte. Carotid ultrasound with 60 to 79% stenosis left ICA. UDS positive for THC, cocaine, benzodiazepines, amphetamines. Vascular surgery was consulted for left ICA stenosis evaluation.   Past Medical History:  Diagnosis Date   Aortic atherosclerosis (HCC)    Cervical spinal stenosis    Chronic kidney disease    COPD (chronic obstructive pulmonary disease) (HCC)    Coronary artery calcification seen on CT scan    ETOH abuse    a.) 28+ standard drinks/week   GERD (gastroesophageal  reflux disease)    History of amiodarone  therapy    a.) in the past; self discontinued   History of medication noncompliance    a.) reported 10/2022 that he has been off NOAC for several months secondary to vertiginous symptoms, not acting like himself, and depression; b.) off/not taking as prescribed: antiarrhythmics, antihypertensives, statins, and MDIs   Hyperlipidemia    Hypertension    Long term current use of anticoagulant    a.) rivaroxaban    PAF (paroxysmal atrial fibrillation) (HCC)    a.) CHA2DS2VASc = 3 (age, HTN, vascular disease history);  b.) rate/rhythm maintained on oral carvediolol (self discontinued diltiazem  + amiodarone  + metoprolol ); chronically anticoagulated with rivaroxaban  (switched from apixaban  11/17/2022)   Tobacco abuse     Past Surgical History:  Procedure Laterality Date   SHOULDER ARTHROSCOPY WITH SUBACROMIAL DECOMPRESSION, ROTATOR CUFF REPAIR AND BICEP TENDON REPAIR Left 12/07/2022   Procedure: SHOULDER ARTHROSCOPY WITH DEBRIDEMENT, DECOMPRESSION, ROTATOR CUFF REPAIR AND BICEPS TENODESIS.- RNFA;  Surgeon: Edie Norleen PARAS, MD;  Location: [facility];  Service: Orthopedics;  Laterality: Left;   SHOULDER ARTHROSCOPY WITH SUBACROMIAL DECOMPRESSION, ROTATOR CUFF REPAIR AND BICEP TENDON REPAIR Left 02/02/2023   Procedure: LEFT SHOULDER ARTHROSCOPY WITH DEBRIDEMENT AND REPAIR OF RECURRENT ROTATOR CUFF TEAR OF LEFT SHOULDER;  Surgeon: Edie Norleen PARAS, MD;  Location: [facility];  Service: Orthopedics;  Laterality: Left;   TONSILLECTOMY      No Known Allergies  Prior to Admission medications   Medication Sig Start Date End Date Taking? Authorizing Provider  albuterol  (VENTOLIN  HFA) 108 (90 Base) MCG/ACT inhaler INHALE 2 PUFFS BY  MOUTH EVERY 4 HOURS AS NEEDED FOR WHEEZE OR FOR SHORTNESS OF BREATH 11/03/23  Yes Icard, Bradley L, DO  famotidine  (PEPCID ) 40 MG tablet TAKE 1 TABLET EVERY DAY 05/29/24  Yes McClung, Angela M, PA-C  hydrOXYzine  (VISTARIL ) 25 MG capsule TAKE 1 CAPSULE  (25 MG TOTAL) BY MOUTH EVERY 6 (SIX) HOURS AS NEEDED. 02/29/24  Yes Tanda Bleacher, MD  TRELEGY ELLIPTA  100-62.5-25 MCG/ACT AEPB INHALE 1 PUFF EVERY DAY 04/22/24  Yes Tanda Bleacher, MD  lisinopril  (ZESTRIL ) 5 MG tablet TAKE 1 TABLET EVERY DAY Patient not taking: Reported on 06/07/2024 01/11/24   Tanda Bleacher, MD    Social History   Socioeconomic History   Marital status: Married    Spouse name: [name] Irving   Number of children: 2   Years of education: Not on file   Highest education level: Not on file  Occupational History   Occupation: retired  Tobacco Use   Smoking status: Every Day    Current packs/day: 0.25    Average packs/day: 0.3 packs/day for 53.5 years (13.4 ttl pk-yrs)    Types: Cigarettes    Start date: 12/19/1970   Smokeless tobacco: Former   Tobacco comments:    Smokes 3 packs of cigarettes a week. 06/30/23 Tay  Vaping Use   Vaping status: Never Used  Substance and Sexual Activity   Alcohol use: Yes    Alcohol/week: 28.0 standard drinks of alcohol    Types: 14 Cans of beer, 14 Shots of liquor per week   Drug use: Yes    Types: Marijuana    Comment: + cocaine UDS 06/16/2021   Sexual activity: Yes    Partners: Female    Birth control/protection: None  Other Topics Concern   Not on file  Social History Narrative   Lives with wife.   Social Drivers of Corporate investment banker Strain: Low Risk  (09/29/2023)   Overall Financial Resource Strain (CARDIA)    Difficulty of Paying Living Expenses: Not hard at all  Food Insecurity: No Food Insecurity (09/29/2023)   Hunger Vital Sign    Worried About Running Out of Food in the Last Year: Never true    Ran Out of Food in the Last Year: Never true  Transportation Needs: No Transportation Needs (09/29/2023)   PRAPARE - Administrator, Civil Service (Medical): No    Lack of Transportation (Non-Medical): No  Physical Activity: Inactive (09/29/2023)   Exercise Vital Sign    Days of Exercise per Week: 0 days     Minutes of Exercise per Session: 0 min  Stress: No Stress Concern Present (09/29/2023)   [employer] of Occupational Health - Occupational Stress Questionnaire    Feeling of Stress : Not at all  Social Connections: Moderately Integrated (09/29/2023)   Social Connection and Isolation Panel    Frequency of Communication with Friends and Family: More than three times a week    Frequency of Social Gatherings with Friends and Family: More than three times a week    Attends Religious Services: More than 4 times per year    Active Member of [employer] or Organizations: No    Attends Banker Meetings: Never    Marital Status: Married  Catering manager Violence: Not At Risk (09/29/2023)   Humiliation, Afraid, Rape, and Kick questionnaire    Fear of Current or Ex-Partner: No    Emotionally Abused: No    Physically Abused: No    Sexually Abused: No  Family History  Problem Relation Age of Onset   Emphysema Father    CAD Father        CABG   Stroke Mother    CAD Brother 83       CABG   Hypertension Brother     ROS: Otherwise negative unless mentioned in HPI  Physical Examination  Vitals:   06/10/24 0854 06/10/24 1218  BP:    Pulse: 99 100  Resp: 16   Temp:  98.7 F (37.1 C)  SpO2: 95% 96%   Body mass index is 20.57 kg/m.  General: thin, chronically ill appearing, somewhat lethargic Gait: Not observed HENT: WNL, normocephalic Pulmonary: normal non-labored breathing, without wheezing Cardiac: regular, without  Murmurs Abdomen: soft Extremities: without ischemic changes, without Gangrene , without cellulitis; without open wounds; reports sensation intact, grip strength intact bilaterally, however does not follow commands. Does appear to have some RUE weakness, will not raise legs off bed or arms. Musculoskeletal: no muscle wasting or atrophy  Neurologic: A&O X 3;  No focal weakness or paresthesias are detected; speech is fluent/normal Psychiatric:  The  pt calm, unable to follow commands fully, laughs and muttering seemingly confused Lymph:  Unremarkable  CBC    Component Value Date/Time   WBC 6.1 06/10/2024 0456   RBC 3.82 (L) 06/10/2024 0456   HGB 14.2 06/10/2024 0456   HGB 15.8 07/01/2022 1039   HCT 39.2 06/10/2024 0456   HCT 45.1 07/01/2022 1039   PLT 193 06/10/2024 0456   PLT 280 07/01/2022 1039   MCV 102.6 (H) 06/10/2024 0456   MCV 103 (H) 07/01/2022 1039   MCH 37.2 (H) 06/10/2024 0456   MCHC 36.2 (H) 06/10/2024 0456   RDW 14.1 06/10/2024 0456   RDW 13.7 07/01/2022 1039   LYMPHSABS 0.8 06/06/2024 2212   LYMPHSABS 0.8 07/01/2022 1039   MONOABS 0.7 06/06/2024 2212   EOSABS 0.0 06/06/2024 2212   EOSABS 0.2 07/01/2022 1039   BASOSABS 0.1 06/06/2024 2212   BASOSABS 0.1 07/01/2022 1039    BMET    Component Value Date/Time   NA 137 06/10/2024 0456   NA 133 (L) 07/01/2022 1039   K 3.3 (L) 06/10/2024 0456   CL 109 06/10/2024 0456   CO2 16 (L) 06/10/2024 0456   GLUCOSE 84 06/10/2024 0456   BUN 12 06/10/2024 0456   BUN 7 (L) 07/01/2022 1039   CREATININE 1.09 06/10/2024 0456   CALCIUM  8.7 (L) 06/10/2024 0456   GFRNONAA >60 06/10/2024 0456   GFRAA 58 (L) 09/08/2020 0910    COAGS: Lab Results  Component Value Date   INR 1.0 06/06/2024   INR 1.0 11/17/2021   INR 1.0 04/07/2021     Non-Invasive Vascular Imaging:   CT Head without contrast(Personally reviewed): 1. No evidence of acute intracranial abnormality [initially however on review after CTA completed, there does seem to be some left insular hypodensity concerning for likely acute stroke with aspects of 9]. 2. Moderate presumed chronic microvascular ischemic disease.   CT angio Head and Neck with contrast(Personally reviewed): 1. Occluded proximal left M3 MCA. 2. Critical, nearly occlusive stenosis at the left carotid bifurcation. The degree of stenosis is difficult to quantify given the degree of bulky calcific atherosclerosis but likely at least 80%. 3.  Approximately 55% stenosis of the right ICA. 4. Moderate right V2 vertebral artery stenosis due to compression from adjacent osteophyte.     MRI Brain(Personally reviewed): Multiple acute infarcts in the left MCA and left PCA territories, including the left  basal ganglia, left posterior limb of the internal capsule, left anterior insula, overlying left frontal and parietal lobes and the left occipital lobe. No mass effect.  Statin:  No. Beta Blocker:  No. Aspirin :  Yes.   ACEI:  No. ARB:  No. CCB use:  No Other antiplatelets/anticoagulants:  Yes.   Plavix    ASSESSMENT/PLAN: This is a 68 y.o. male with past medical history of HTN, HLD, atrial fibrillation non compliant with anticoagulation, GERD, CKD, COPD and poly substance abuse who presented with confusion and right arm weakness. Now admitted with acute stroke, potentially Cardioembolic from his PAF since he is not compliant with his anticoagulation, also found to have heavily calcified left ICA with about 80% stenosis on CTA head and neck. Hard to fully evaluate his current symptoms with his confusion/ inability to follow commands. However, he would benefit from carotid intervention to reduce his stroke risk in the future. He has been started on DAPT therapy with Aspirin  and Plavix . Would benefit from addition of high intensity statin.  Discussed both CEA vs TCAR with patient and his wife. With his multiple medical issues as well as non compliance with medications he would likely best be managed with carotid endarterectomy. The on call vascular surgeon, Dr. Gretta will see patient later this afternoon to provide further recommendations and timing of surgical intervention.    Teretha Damme PA-C [facility] [phone] 06/10/2024  2:12 PM  I have seen and evaluated the patient. I agree with the PA note as documented above.  68 year old male that vascular surgery has been consulted for symptomatic high-grade left carotid  stenosis.  Patient's wife is at bedside and states she brought him to the hospital after being found altered with some right arm weakness.  Ultimately MRI brain showed left brain infarcts.  CTA neck confirms about 80% calcified bulky atherosclerotic plaque in the left carotid.  I have discussed recommendation for left carotid endarterectomy for stroke risk reduction.  Discussed this being done in the OR.  Risk benefits discussed including 1% perioperative stroke risk.  He can continue aspirin  Plavix .  Tentative plan is Thursday with Dr. [name] in the OR.  Vascular will follow.  Lonni DOROTHA Gretta, MD [facility] Office: [phone]

## 2024-06-10 NOTE — Progress Notes (Signed)
 Inpatient Rehab Admissions Coordinator:   Pt remains lethargic, not yet ready for CIR. Will follow up once more alert and able to participate.   Leita Kleine, MS, CCC-SLP Rehab Admissions Coordinator  [phone] (celll) [phone] (office)

## 2024-06-11 DIAGNOSIS — R1312 Dysphagia, oropharyngeal phase: Secondary | ICD-10-CM

## 2024-06-11 DIAGNOSIS — E785 Hyperlipidemia, unspecified: Secondary | ICD-10-CM

## 2024-06-11 DIAGNOSIS — I4891 Unspecified atrial fibrillation: Secondary | ICD-10-CM | POA: Diagnosis not present

## 2024-06-11 DIAGNOSIS — I10 Essential (primary) hypertension: Secondary | ICD-10-CM | POA: Diagnosis not present

## 2024-06-11 DIAGNOSIS — I639 Cerebral infarction, unspecified: Secondary | ICD-10-CM | POA: Diagnosis not present

## 2024-06-11 DIAGNOSIS — I6523 Occlusion and stenosis of bilateral carotid arteries: Secondary | ICD-10-CM | POA: Diagnosis not present

## 2024-06-11 DIAGNOSIS — F17200 Nicotine dependence, unspecified, uncomplicated: Secondary | ICD-10-CM | POA: Diagnosis not present

## 2024-06-11 DIAGNOSIS — F1090 Alcohol use, unspecified, uncomplicated: Secondary | ICD-10-CM

## 2024-06-11 DIAGNOSIS — I69391 Dysphagia following cerebral infarction: Secondary | ICD-10-CM | POA: Diagnosis not present

## 2024-06-11 DIAGNOSIS — I480 Paroxysmal atrial fibrillation: Secondary | ICD-10-CM

## 2024-06-11 DIAGNOSIS — F1910 Other psychoactive substance abuse, uncomplicated: Secondary | ICD-10-CM

## 2024-06-11 DIAGNOSIS — I63512 Cerebral infarction due to unspecified occlusion or stenosis of left middle cerebral artery: Secondary | ICD-10-CM | POA: Diagnosis not present

## 2024-06-11 LAB — BASIC METABOLIC PANEL WITH GFR
Anion gap: 10 (ref 5–15)
BUN: 7 mg/dL — ABNORMAL LOW (ref 8–23)
CO2: 20 mmol/L — ABNORMAL LOW (ref 22–32)
Calcium: 8.8 mg/dL — ABNORMAL LOW (ref 8.9–10.3)
Chloride: 105 mmol/L (ref 98–111)
Creatinine, Ser: 0.86 mg/dL (ref 0.61–1.24)
GFR, Estimated: >60 mL/min (ref >60)
Glucose, Bld: 96 mg/dL (ref 70–99)
Potassium: 3.6 mmol/L (ref 3.5–5.1)
Sodium: 135 mmol/L (ref 135–145)

## 2024-06-11 LAB — MAGNESIUM: Magnesium: 1.8 mg/dL (ref 1.7–2.4)

## 2024-06-11 LAB — VITAMIN B1: Vitamin B1 (Thiamine): 147.6 nmol/L (ref 66.5–200.0)

## 2024-06-11 MED ORDER — LABETALOL HCL 5 MG/ML IV SOLN
10.0000 mg | INTRAVENOUS | Status: DC | PRN
  Administered 2024-06-11 – 2024-06-12 (×3): 10 mg via INTRAVENOUS
  Filled 2024-06-11 (×3): qty 4

## 2024-06-11 MED ORDER — POTASSIUM CHLORIDE CRYS ER 20 MEQ PO TBCR
30.0000 meq | EXTENDED_RELEASE_TABLET | ORAL | Status: AC
  Administered 2024-06-11 (×2): 30 meq via ORAL
  Filled 2024-06-11 (×2): qty 1

## 2024-06-11 MED ORDER — MAGNESIUM SULFATE 2 GM/50ML IV SOLN
2.0000 g | Freq: Once | INTRAVENOUS | Status: AC
  Administered 2024-06-11: 2 g via INTRAVENOUS
  Filled 2024-06-11: qty 50

## 2024-06-11 MED ORDER — LACTATED RINGERS IV SOLN: INTRAVENOUS | Status: AC

## 2024-06-11 NOTE — Progress Notes (Signed)
 Inpatient Rehab Admissions Coordinator:    CIR following. I will likely send case to insurance today/tomorrow.   Leita Kleine, MS, CCC-SLP Rehab Admissions Coordinator  [phone] (celll) [phone] (office)

## 2024-06-11 NOTE — Progress Notes (Signed)
 PROGRESS NOTE    Ronald Lowe  FMW:[id number] DOB: Aug 26, 1956 DOA: 06/06/2024 PCP: Tanda Bleacher, MD    Brief Narrative:   Ronald Lowe is a 68 y.o. male with past medical history significant for HTN, HLD, paroxysmal atrial fibrillation noncompliant with anticoagulation, GERD, tobacco use disorder, CKD3a, COPD, EtOH use disorder who presented to [facility] ED via EMS after family found him sitting on the floor confused with right arm weakness.  LKN was 06/06/24 at 7:30 AM when he left for work.  History apparently obtained from review of chart and family as he is unable to precipitate Foley.  Family notes that he has been having frequent episodes of confusion and they have been concerned about dementia versus the confusion being related to his heavy drinking.  Spouse reports 4-5 vodka drinks daily.  In the ED, temperature 99.6 F, BP 205/95, HR 109, RR 22, SpO2 99% on room air.  WBC 7.5, hemoglobin 17.0, platelet count 209.  Sodium 138, potassium 3.7, chloride 103, CO2 23, glucose 98, BUN 16, creatinine 1.53.  AST 37, ALT 22, total bilirubin 2.0. CT head obtained and shows hypoattenuation in the left anterior insular region concerning for acute infarct and chronic microvascular ischemic disease.  MRI obtained for better characterization of CT head findings.  MRI showed occluded proximal left M3 MCA, nearly occlusive stenosis of the left carotid bifurcation, 55% stenosis of the right ICA, and right V2 vertebral stenosis secondary to compression from adjacent osteophyte.  CXR obtained and negative for acute abnormalities.  He was given Ativan  in the ED.  Neurology consulted as part of code stroke and [facility] later contacted for admission.  Assessment & Plan:   Acute multifocal CVA Patient presenting after being found altered, with right-sided arm weakness, aphasia with last known normal 7:30 AM on 06/06/2024.  Complicated by his heavy alcohol abuse, tobacco use and noncompliance with anticoagulation.  MR brain  without contrast with multiple acute infarcts left MCA, left PCA, left basal ganglia, left posterior limb of the internal capsule, left anterior insula, overlying left frontal and parietal lobes of the left occipital lobe without mass effect.  CT angiogram head/neck with occluded proximal left M3 MCA, nearly occlusive stenosis left carotid bifurcation, 55% stenosis right ICA, moderate V2 vertebral artery stenosis due to compression from adjacent osteophyte.  UDS positive for THC, cocaine, benzodiazepines, amphetamines.  Hemoglobin A1c 4.2.  LDL 66.  Carotid ultrasound with 60 to 79% stenosis left ICA.  TTE with LVEF 60 to 65%, LV with no regional wall motion normalities, grade 1 diastolic dysfunction, small pericardial effusion without evidence of tamponade, trivial MR, no aortic stenosis. -- Neurology following, appreciate assistance -- Seen by SLP> dysphagia 2 diet, assist with meals; aspiration precautions -- PT/OT evaluation: Recommend CIR admission -- labetalol  10 mg IV q2h PRN; goal BP <180/105 given intracranial stenosis -- DAPT with aspirin  81 mg p.o. daily, Plavix  75 mg p.o. daily  Left ICA stenosis Carotid ultrasound with 60 to 79% stenosis left ICA. -- Vascular surgery following, pending carotid endarterectomy 6/26; continue DAPT  Hypomagnesemia Hypokalemia Potassium 3.6, Mag 1.8; will continue repletion -- Repeat electrolytes in a.m.  EtOH use disorder with withdrawal Spouse endorses patient drinks 4-5 vodka drinks daily. -- CIWA protocol with as needed Ativan  -- Thiamine , folate, multivitamin  Acute kidney injury on chronic kidney disease: Resolved Unclear baseline creatinine. Notes from 2022 and 2023 noted baseline creatinine of 1.9 however most recent readings from 2022 and 2023 in [facility] show creatinine of 0.94 -  1.2. -- Cr 1.53>1.09>0.86 -- IVF with LR at 75 mL/h -- BMP in the a.m.  Right shoulder/forearm pain Patient grimaces with flexion/extension at (R) elbow and  palpation of forearm.  X-ray of right forearm/shoulder negative for fracture or dislocation.  Right upper from edema Etiology likely secondary to IV infiltration. Vascular duplex ultrasound RUE: Negative for DVT   Hypertension -- Labetalol  10 mg IV q2h PRN SBP > 180, DBP > 105    Paroxysmal Atrial Fibrillation Noncompliant with outpatient anticoagulation and Coreg .  Likely not a great candidate for anticoagulation given his fall risk, severe alcohol use disorder.   Hyperlipidemia Noncompliant with outpatient statin.  Lipid panel with total cholesterol 141, HDL 59, LDL 66, triglycerides 79. -- Restart statin once demonstrates improved oral intake   COPD -- Pulmicort  neb twice daily -- Brovana  neb twice daily -- Yupelri  neb daily -- Albuterol  neb every 4 hours.  Wheezing/shortness of breath  GERD -- Protonix  40 mg IV every 24 hours   Tobacco Use Disorder -- Nicotine  patch   Illicit substance abuse UDS positive for THC, cocaine, benzodiazepines, and feta means.     DVT prophylaxis: enoxaparin  (LOVENOX ) injection 40 mg Start: 06/07/24 1800    Code Status: Full Code Family Communication: Updated spouse present at bedside this morning  Disposition Plan:  Level of care: Telemetry Medical Status is: Inpatient Remains inpatient appropriate because: Pending carotid endarterectomy 6/26, will need placement    Consultants:  Neurology Vascular surgery  Procedures:  Carotid ultrasound TTE:  Vascular duplex ultrasound right upper extremity  Antimicrobials:  None   Subjective: Patient seen examined bedside, lying in bed.  Remains in restraints.  Alert to self but otherwise remains confused. No family present at bedside; RN present.  Requesting something to drink, states mouth is dry.   Denies headache, no chest pain, no shortness of breath, no abdominal pain.  No acute concerns overnight per nursing staff.  Objective: Vitals:   06/11/24 0802 06/11/24 0816 06/11/24 0817  06/11/24 0914  BP: (!) 174/149   (!) 139/90  Pulse: 92   89  Resp: 20     Temp: 98 F (36.7 C)   98.7 F (37.1 C)  TempSrc: Oral     SpO2: 100% 100% 100%   Weight:      Height:        Intake/Output Summary (Last 24 hours) at 06/11/2024 1026 Last data filed at 06/11/2024 9073 Gross per 24 hour  Intake 771.59 ml  Output 1100 ml  Net -328.41 ml   Filed Weights   06/06/24 2200 06/06/24 2236  Weight: 68.8 kg 68.8 kg    Examination:  Physical Exam: GEN: NAD, alert to self, chronically ill in appearance, appears older than stated age HEENT: NCAT, PERRL, EOMI, sclera clear, dry mucous membranes PULM: CTAB w/o wheezes/crackles, normal respiratory effort, on room air CV: RRR w/o M/G/R GI: abd soft, NTND, + BS MSK: no peripheral edema NEURO: Moves all extremities with preserved muscle strength, + dysarthria Integumentary: No concerning rashes/lesions/wounds noted to exposed skin surfaces    Data Reviewed: I have personally reviewed following labs and imaging studies  CBC: Recent Labs  Lab 06/06/24 2212 06/07/24 0836 06/08/24 0653 06/10/24 0456  WBC 7.5 10.0 9.0 6.1  NEUTROABS 5.9  --   --   --   HGB 17.0  17.0 17.1* 16.8 14.2  HCT 47.6  50.0 48.2 46.4 39.2  MCV 104.2* 105.0* 103.3* 102.6*  PLT 209 206 178 193   Basic Metabolic Panel:  Recent Labs  Lab 06/07/24 0836 06/08/24 0653 06/09/24 0521 06/10/24 0456 06/11/24 0445  NA 139 139 137 137 135  K 3.7 3.5 3.5 3.3* 3.6  CL 104 106 108 109 105  CO2 21* 15* 15* 16* 20*  GLUCOSE 80 75 76 84 96  BUN 15 14 12 12  7*  CREATININE 1.19 1.07 1.04 1.09 0.86  CALCIUM  9.4 8.9 8.6* 8.7* 8.8*  MG  --  1.5*  --  1.4* 1.8  PHOS  --   --   --  3.2  --    GFR: Estimated Creatinine Clearance: 78.9 mL/min (by C-G formula based on SCr of 0.86 mg/dL). Liver Function Tests: Recent Labs  Lab 06/06/24 2212 06/08/24 0653 06/09/24 0521 06/10/24 0456  AST 37 40 36 38  ALT 22 20 19 20   ALKPHOS 58 54 52 44  BILITOT 2.0* 2.2*  1.7* 1.7*  PROT 7.1 6.4* 6.4* 5.7*  ALBUMIN 4.3 3.7 3.7 3.2*   No results for input(s): LIPASE, AMYLASE in the last 168 hours. No results for input(s): AMMONIA in the last 168 hours. Coagulation Profile: Recent Labs  Lab 06/06/24 2212  INR 1.0   Cardiac Enzymes: No results for input(s): CKTOTAL, CKMB, CKMBINDEX, TROPONINI in the last 168 hours. BNP (last 3 results) No results for input(s): PROBNP in the last 8760 hours. HbA1C: No results for input(s): HGBA1C in the last 72 hours.  CBG: Recent Labs  Lab 06/06/24 2207  GLUCAP 95   Lipid Profile: No results for input(s): CHOL, HDL, LDLCALC, TRIG, CHOLHDL, LDLDIRECT in the last 72 hours.  Thyroid  Function Tests: No results for input(s): TSH, T4TOTAL, FREET4, T3FREE, THYROIDAB in the last 72 hours. Anemia Panel: No results for input(s): VITAMINB12, FOLATE, FERRITIN, TIBC, IRON, RETICCTPCT in the last 72 hours.  Sepsis Labs: No results for input(s): PROCALCITON, LATICACIDVEN in the last 168 hours.  No results found for this or any previous visit (from the past 240 hours).       Radiology Studies: VAS US  UPPER EXTREMITY VENOUS DUPLEX Result Date: 06/10/2024 UPPER VENOUS STUDY  Patient Name:  Ronald Lowe  Date of Exam:   06/09/2024 Medical Rec #: [id number]       Accession #:    [id number] Date of Birth: 12-03-1956       Patient Gender: M Patient Age:   20 years Exam Location:  [facility] Procedure:      VAS US  UPPER EXTREMITY VENOUS DUPLEX Referring Phys: ARY XU --------------------------------------------------------------------------------  Indications: Swelling Risk Factors: None identified. Comparison Study: No prior studies. Performing Technologist: Cordella Collet RVT  Examination Guidelines: A complete evaluation includes B-mode imaging, spectral Doppler, color Doppler, and power Doppler as needed of all accessible portions of each vessel. Bilateral  testing is considered an integral part of a complete examination. Limited examinations for reoccurring indications may be performed as noted.  Right Findings: +----------+------------+---------+-----------+----------+-------+ RIGHT     CompressiblePhasicitySpontaneousPropertiesSummary +----------+------------+---------+-----------+----------+-------+ IJV           Full       Yes       Yes                      +----------+------------+---------+-----------+----------+-------+ Subclavian               Yes       Yes                      +----------+------------+---------+-----------+----------+-------+ Axillary  Full       Yes       Yes                      +----------+------------+---------+-----------+----------+-------+ Brachial      Full                                          +----------+------------+---------+-----------+----------+-------+ Radial        Full                                          +----------+------------+---------+-----------+----------+-------+ Ulnar         Full                                          +----------+------------+---------+-----------+----------+-------+ Cephalic      Full                                          +----------+------------+---------+-----------+----------+-------+ Basilic       Full                                          +----------+------------+---------+-----------+----------+-------+  Left Findings: +----------+------------+---------+-----------+----------+-------+ LEFT      CompressiblePhasicitySpontaneousPropertiesSummary +----------+------------+---------+-----------+----------+-------+ Subclavian               Yes       Yes                      +----------+------------+---------+-----------+----------+-------+  Summary:  Right: No evidence of deep vein thrombosis in the upper extremity. No evidence of superficial vein thrombosis in the upper extremity.  Left: No evidence of  thrombosis in the subclavian.  *See table(s) above for measurements and observations.  Diagnosing physician: Lonni Gaskins MD Electronically signed by Lonni Gaskins MD on 06/10/2024 at 8:20:12 AM.    Final    ECHOCARDIOGRAM COMPLETE Result Date: 06/09/2024    ECHOCARDIOGRAM REPORT   Patient Name:   Ronald Lowe Date of Exam: 06/09/2024 Medical Rec #:  [id number]      Height:       72.0 in Accession #:    [id number]     Weight:       151.7 lb Date of Birth:  10-30-1956      BSA:          1.894 m Patient Age:    67 years       BP:           98/73 mmHg Patient Gender: M              HR:           85 bpm. Exam Location:  Inpatient Procedure: 2D Echo, Cardiac Doppler and Color Doppler (Both Spectral and Color            Flow Doppler were utilized during procedure). Indications:    Stroke I63.9  History:        Patient has no  prior history of Echocardiogram examinations.                 Stroke and COPD, Arrythmias:Atrial Fibrillation,                 Signs/Symptoms:Hypotension; Risk Factors:Hypertension,                 Dyslipidemia and Current Smoker.  Sonographer:    Thea Norlander RCS Referring Phys: ERIN C LEHNER  Sonographer Comments: Image acquisition challenging due to uncooperative patient. IMPRESSIONS  1. Left ventricular ejection fraction, by estimation, is 60 to 65%. The left ventricle has normal function. The left ventricle has no regional wall motion abnormalities. There is severe asymmetric left ventricular hypertrophy of the septal segment. Left  ventricular diastolic parameters are consistent with Grade I diastolic dysfunction (impaired relaxation).  2. Right ventricular systolic function is normal. The right ventricular size is normal.  3. A small pericardial effusion is present. The pericardial effusion is localized near the right ventricle. There is no evidence of cardiac tamponade.  4. The mitral valve is abnormal. Trivial mitral valve regurgitation.  5. The aortic valve was not well  visualized. Aortic valve regurgitation is not visualized. Aortic valve sclerosis/calcification is present, without any evidence of aortic stenosis. Comparison(s): No prior Echocardiogram. FINDINGS  Left Ventricle: Left ventricular ejection fraction, by estimation, is 60 to 65%. The left ventricle has normal function. The left ventricle has no regional wall motion abnormalities. The left ventricular internal cavity size was normal in size. There is  severe asymmetric left ventricular hypertrophy of the septal segment. Left ventricular diastolic parameters are consistent with Grade I diastolic dysfunction (impaired relaxation). Indeterminate filling pressures. Right Ventricle: The right ventricular size is normal. No increase in right ventricular wall thickness. Right ventricular systolic function is normal. Left Atrium: Left atrial size was normal in size. Right Atrium: Right atrial size was normal in size. Pericardium: A small pericardial effusion is present. The pericardial effusion is localized near the right ventricle. There is no evidence of cardiac tamponade. Mitral Valve: The mitral valve is abnormal. Mild mitral annular calcification. Trivial mitral valve regurgitation. Tricuspid Valve: The tricuspid valve is not well visualized. Tricuspid valve regurgitation is not demonstrated. Aortic Valve: The aortic valve was not well visualized. Aortic valve regurgitation is not visualized. Aortic valve sclerosis/calcification is present, without any evidence of aortic stenosis. Aortic valve peak gradient measures 8.8 mmHg. Pulmonic Valve: The pulmonic valve was not well visualized. Pulmonic valve regurgitation is not visualized. Aorta: The aortic root and ascending aorta are structurally normal, with no evidence of dilitation. IAS/Shunts: The interatrial septum was not well visualized.  LEFT VENTRICLE PLAX 2D LVIDd:         3.00 cm Diastology LVIDs:         2.10 cm LV e' medial:    5.00 cm/s LV PW:         1.10 cm LV  E/e' medial:  11.9 LV IVS:        1.70 cm LV e' lateral:   8.49 cm/s                        LV E/e' lateral: 7.0  RIGHT VENTRICLE RV S prime:     18.60 cm/s TAPSE (M-mode): 2.7 cm LEFT ATRIUM             Index        RIGHT ATRIUM  Index LA diam:        3.60 cm 1.90 cm/m   RA Area:     13.30 cm LA Vol (A2C):   43.8 ml 23.12 ml/m  RA Volume:   26.30 ml  13.88 ml/m LA Vol (A4C):   54.0 ml 28.51 ml/m LA Biplane Vol: 52.1 ml 27.50 ml/m  AORTIC VALVE AV Vmax:      148.00 cm/s AV Peak Grad: 8.8 mmHg LVOT Vmax:    98.07 cm/s LVOT Vmean:   65.167 cm/s LVOT VTI:     0.154 m MITRAL VALVE MV Area (PHT): 2.50 cm    SHUNTS MV Decel Time: 303 msec    Systemic VTI: 0.15 m MV E velocity: 59.70 cm/s MV A velocity: 84.40 cm/s MV E/A ratio:  0.71 Vinie Maxcy MD Electronically signed by Vinie Maxcy MD Signature Date/Time: 06/09/2024/4:12:17 PM    Final    ECHOCARDIOGRAM COMPLETE Result Date: 06/07/2024    ECHOCARDIOGRAM REPORT   Patient Name:   Ronald Lowe Date of Exam: 06/07/2024 Medical Rec #:  [id number]      Height:       72.0 in Accession #:    [id number]     Weight:       151.7 lb Date of Birth:  02/24/1956      BSA:          1.894 m Patient Age:    67 years       BP:           11/11 mmHg Patient Gender: M              HR:           1 bpm. Exam Location:  Inpatient Procedure: 2D Echo (Both Spectral and Color Flow Doppler were utilized during            procedure). Indications:    stroke  History:        Patient has no prior history of Echocardiogram examinations.  Sonographer:    Koleen Popper Referring Phys: [id number] SRISHTI L BHAGAT IMPRESSIONS  1. Echo not perfomred due to patient non compliance.  2. Left ventricular ejection fraction, by estimation, is Not done%. The left ventricle has not done function. Left ventricular endocardial border not optimally defined to evaluate regional wall motion. The left ventricular internal cavity size was not done. Left ventricular diastolic function could not be  evaluated.  3. Right ventricular systolic function was not well visualized. The right ventricular size is not well visualized.  4. The mitral valve was not assessed. No evidence of mitral valve regurgitation.  5. The aortic valve was not assessed. Aortic valve regurgitation is not visualized.  6. Aortic Not imaged. FINDINGS  Left Ventricle: Left ventricular ejection fraction, by estimation, is Not done%. The left ventricle has not done function. Left ventricular endocardial border not optimally defined to evaluate regional wall motion. Strain was performed and the global longitudinal strain is indeterminate. The left ventricular internal cavity size was not done. Suboptimal image quality limits for assessment of left ventricular hypertrophy. Left ventricular diastolic function could not be evaluated. Right Ventricle: The right ventricular size is not well visualized. Right vetricular wall thickness was not assessed. Right ventricular systolic function was not well visualized. Left Atrium: Left atrial size was not assessed. Right Atrium: Right atrial size was not assessed. Pericardium: The pericardium was not assessed. Mitral Valve: The mitral valve was not assessed. No evidence of mitral valve regurgitation. Tricuspid Valve: The tricuspid  valve is not assessed. Tricuspid valve regurgitation is not demonstrated. Aortic Valve: The aortic valve was not assessed. Aortic valve regurgitation is not visualized. Pulmonic Valve: The pulmonic valve was not assessed. Pulmonic valve regurgitation is not visualized. Aorta: Not imaged and the ascending aorta was not well visualized. IAS/Shunts: The interatrial septum was not assessed. Additional Comments: Echo not perfomred due to patient non compliance. 3D was performed not requiring image post processing on an independent workstation and was indeterminate. Maude Emmer MD Electronically signed by Maude Emmer MD Signature Date/Time: 06/07/2024/3:38:19 PM    Final          Scheduled Meds:  arformoterol   15 mcg Nebulization BID   aspirin   300 mg Rectal Daily   Or   aspirin   325 mg Oral Daily   budesonide  (PULMICORT ) nebulizer solution  0.5 mg Nebulization BID   clopidogrel   75 mg Oral Daily   vitamin B-12  1,000 mcg Oral Daily   enoxaparin  (LOVENOX ) injection  40 mg Subcutaneous Q24H   feeding supplement  237 mL Oral BID BM   folic acid   1 mg Oral Daily   LORazepam   1 mg Intravenous Once   multivitamin with minerals  1 tablet Oral Daily   nicotine   14 mg Transdermal Daily   pantoprazole  (PROTONIX ) IV  40 mg Intravenous Q24H   potassium chloride   30 mEq Oral Q3H   revefenacin   175 mcg Nebulization Daily   [START ON 06/15/2024] thiamine  (VITAMIN B1) injection  100 mg Intravenous Daily   Continuous Infusions:  lactated ringers  75 mL/hr at 06/11/24 0741   thiamine  (VITAMIN B1) injection 250 mg (06/11/24 0909)     LOS: 4 days    Time spent: 48 minutes spent on 06/11/2024 caring for this patient face-to-face including chart review, ordering labs/tests, documenting, discussion with nursing staff, consultants, updating family and interview/physical exam    Camellia PARAS [place], DO [facility] Available via Epic secure chat 7am-7pm After these hours, please refer to coverage provider listed on [email or web address] 06/11/2024, 10:26 AM

## 2024-06-11 NOTE — Plan of Care (Signed)
  Problem: Education: Goal: Knowledge of disease or condition will improve Outcome: Progressing   Problem: Ischemic Stroke/TIA Tissue Perfusion: Goal: Complications of ischemic stroke/TIA will be minimized Outcome: Progressing   Problem: Health Behavior/Discharge Planning: Goal: Goals will be collaboratively established with patient/family Outcome: Progressing   Problem: Nutrition: Goal: Dietary intake will improve Outcome: Progressing

## 2024-06-11 NOTE — Progress Notes (Signed)
 Patient is STROKE TEAM PROGRESS NOTE    INTERIM HISTORY/SUBJECTIVE No family is at the bedside. Pt awake alert, but in Posey belt and bilateral mitten.  Vascular surgery on board, planning for left CEA next Thursday but pending mental status improvement   OBJECTIVE  CBC    Component Value Date/Time   WBC 6.1 06/10/2024 0456   RBC 3.82 (L) 06/10/2024 0456   HGB 14.2 06/10/2024 0456   HGB 15.8 07/01/2022 1039   HCT 39.2 06/10/2024 0456   HCT 45.1 07/01/2022 1039   PLT 193 06/10/2024 0456   PLT 280 07/01/2022 1039   MCV 102.6 (H) 06/10/2024 0456   MCV 103 (H) 07/01/2022 1039   MCH 37.2 (H) 06/10/2024 0456   MCHC 36.2 (H) 06/10/2024 0456   RDW 14.1 06/10/2024 0456   RDW 13.7 07/01/2022 1039   LYMPHSABS 0.8 06/06/2024 2212   LYMPHSABS 0.8 07/01/2022 1039   MONOABS 0.7 06/06/2024 2212   EOSABS 0.0 06/06/2024 2212   EOSABS 0.2 07/01/2022 1039   BASOSABS 0.1 06/06/2024 2212   BASOSABS 0.1 07/01/2022 1039    BMET    Component Value Date/Time   NA 135 06/11/2024 0445   NA 133 (L) 07/01/2022 1039   K 3.6 06/11/2024 0445   CL 105 06/11/2024 0445   CO2 20 (L) 06/11/2024 0445   GLUCOSE 96 06/11/2024 0445   BUN 7 (L) 06/11/2024 0445   BUN 7 (L) 07/01/2022 1039   CREATININE 0.86 06/11/2024 0445   CALCIUM  8.8 (L) 06/11/2024 0445   EGFR 89 07/01/2022 1039   GFRNONAA >60 06/11/2024 0445    IMAGING past 24 hours No results found.    Vitals:   06/11/24 0817 06/11/24 0914 06/11/24 1229 06/11/24 1551  BP:  (!) 139/90 (!) 161/95 (!) 166/100  Pulse:  89 87 96  Resp:   18 18  Temp:  98.7 F (37.1 C) 98.6 F (37 C) 98 F (36.7 C)  TempSrc:   Oral Oral  SpO2: 100%  98% 97%  Weight:      Height:        PHYSICAL EXAM General: thin built, well developed, not in acute distress CV: Regular rate and rhythm on monitor Respiratory:  Regular, unlabored respirations on room air  NEURO:  On posey belt and bilateral mittens, awake, alert, eyes open, orientated to place and self,  told me age 68 instead of 68, not orientated to time. No aphasia but paucity of speech, answer questions with short phrases, moderate dysarthria, following all simple commands. Able to name 2/4 and repeat in dysarthric voices. No gaze palsy, tracking bilaterally, visual field exam showed possible right hemianopia. Mild right facial droop. Tongue midline. Bilateral UEs 3+/5, no drift, distal 4+/5. Bilaterally LEs 3+/5 proximal and 4/5 distally. Sensation symmetrical bilaterally subjectively, b/l FTN intact but slow, gait not tested.     ASSESSMENT/PLAN  [name] is a 68 y.o. male with history of atrial fibrillation not on anticoagulation, hypertension, hyperlipidemia, alcohol abuse, ongoing smoking, cervical spine degenerative disc disease who was admitted for acute stroke, potentially cardioembolic from A-fib. NIH on Admission: 9.  Stroke:  left MCA scattered infarcts, etiology likely due to symptomatic proximal left ICA stenosis CT Head without contrast Subtle hypoattenuation in the anterior left insula  CT angio Head and Neck Occluded proximal left M3 MCA. Critical, nearly occlusive stenosis at the left carotid bifurcation, at least 80%. Approximately 55% stenosis of the right ICA. Moderate right V2 vertebral artery stenosis due to compression from  adjacent osteophyte. MRI Brain Multiple acute infarcts in the left MCA territories, including the left basal ganglia, left posterior limb of the internal capsule, left anterior insula, overlying left frontal and parietal lobes and the left occipital lobe. No mass effect. Carotid Doppler L ICA 60-79% stenosis 2D Echo EF 60-65%, severe asymmetric left ventricular hypertrophy of the septal segment.  LDL 66 HgbA1c 4.2 UDS + cocaine, amphetamine, THC, benzo VTE prophylaxis - lovenox  No antithrombotic prior to admission, now on DAPT with ASA 81mg  and Plavix . Further regimen per VVS Therapy recommendations:  CIR Disposition:  pending  Carotid  stenosis CT angio Head and Neck critical, nearly occlusive stenosis at the left carotid bifurcation, at least 80%. Approximately 55% stenosis of the right ICA. Carotid Doppler left ICA 60 to 79% stenosis Symptomatic this time On DAPT VVS on board, plan for left CEA next Thursday pending mental status improvement  Atrial fibrillation Home Meds: no OAC due to cost per family, noted on chart that patient does not like how it makes him feel.  Continue telemetry monitoring Now on DAPT  Not good candidate for [facility] due to alcohol and substance abuse, fall risk and noncompliance  Hypertension Home meds: Lisinopril  5 mg Stable but fluctuate Avoid hypotension due to intracranial stenosis, BP goal 130-160 prior to carotid intervention Long term BP goal normotensive  Hyperlipidemia Home meds:  none LDL 66, goal < 70 High intensity statin not indicated due to LDL within goal and no po access for now  Alcohol withdraw B12 deficiency  Intermittent agitation  On CIWA protocol On B1/FA/MVI B12 = 180, supplement B12 1000mcg daily  Tobacco Abuse Patient smokes 0.25 packs per day for 53 years, smokes marijuana Cessation education provided Nicotine  replacement therapy provided Pt is willing to quit  Substance Abuse UDS positive for THC, cocaine, benzos, amphetamines Cessation education provided [facility] consult for cessation placed Pt agreed to quit  Dysphagia Patient has post-stroke dysphagia, SLP consulted Now on dys2 and thin liquid Advance diet as tolerated  Other Stroke Risk Factors Advanced age Family hx stroke (mother)  Other Active Problems History of medication noncompliance RUE swollen - UE venous doppler no DVT or SVT  Hospital day # 4  Neurology will sign off. Please call with questions. Pt will follow up with stroke clinic NP at [facility] in about 4 weeks. Thanks for the consult.   Ary Cummins, MD PhD Stroke Neurology 06/11/2024 3:55 PM

## 2024-06-11 NOTE — Consult Note (Signed)
 Physical Medicine and Rehabilitation Consult Reason for Consult:Rehab Referring Physician: Dr. Gretta     HPI: Ronald Lowe is a 68 y.o. male with PMH of polysubstance abuse, atrial fibrillation, hyperlipidemia, COPD, tobacco use, hypertension, A-fib noncompliant with anticoagulation, GERD, CKD, COPD who presented to [facility], ED after he was found confused with right arm weakness.  MRI of the brain showed multiple acute infarcts in left MCA and left PCA territories, neurology suspects likely due to symptomatic proximal left ICA stenosis.  CTA with occluded proximal left M3 MCA, critical stenosis of left carotid bifurcation, stenosis of right ICA, right V2 vertebral artery stenosis due to compression from osteophyte.  Patient had UDS with THC, cocaine, benzos, amphetamines.  He also history of alcohol and tobacco abuse.  Patient is scheduled tentatively for left carotid endarterectomy on Thursday with Dr. [name].  He is on at D2 thin diet for dysphagia.  Seen by OT and PT and found to have functional deficit felt to be candidate for CIR.   Per chart review lives in 1 level home with 2 steps to enter, family able to assist 24 hours/day after discharge.        Home: Home Living Family/patient expects to be discharged to:: Private residence Living Arrangements: Spouse/significant other Available Help at Discharge: Family, Available 24 hours/day Type of Home: House Home Access: Stairs to enter [employer] of Steps: 2 Entrance Stairs-Rails: Right, Left Home Layout: One level Bathroom Shower/Tub: Engineer, manufacturing systems: Standard Bathroom Accessibility: Yes Home Equipment: None Additional Comments: Pt unable to provide hx. Most info obtained from previous admission (2022).  Lives With: Spouse  Functional History: Prior Function Prior Level of Function : Independent/Modified Independent, Patient poor historian/Family not available, Working/employed Mobility  Comments: 3 falls Functional Status:  Mobility: Bed Mobility Overal bed mobility: Needs Assistance Bed Mobility: Supine to Sit, Sit to Supine Supine to sit: Mod assist, +2 for safety/equipment, +2 for physical assistance Sit to supine: Mod assist, +2 for physical assistance, +2 for safety/equipment General bed mobility comments: assist for BLE advancement and trunk management. increased cues and time Transfers Overall transfer level: Needs assistance Equipment used: Rolling walker (2 wheels) Transfers: Sit to/from Stand Sit to Stand: Mod assist, +2 physical assistance, +2 safety/equipment General transfer comment: From EOB with mod A +2 for power up and balance due to R lateral lean. Pt able to take a few side steps towards HOB with max A +2 for weight shifting and advancing feet. Pt more pivoting feet rather than clearing. Increased R lean with prolonged standing and stepping. Ambulation/Gait General Gait Details: unable to progress   ADL: ADL Overall ADL's : Needs assistance/impaired Eating/Feeding: NPO Grooming: Moderate assistance Grooming Details (indicate cue type and reason): unable to bring hand all the way to his face Upper Body Bathing: Cueing for safety Lower Body Bathing: Total assistance, +2 for physical assistance, +2 for safety/equipment Upper Body Dressing : Maximal assistance Lower Body Dressing: +2 for physical assistance, Total assistance, +2 for safety/equipment Toilet Transfer: Maximal assistance, +2 for physical assistance, +2 for safety/equipment Toilet Transfer Details (indicate cue type and reason): simualated at the EOB Toileting- Clothing Manipulation and Hygiene: Total assistance, +2 for physical assistance, +2 for safety/equipment Functional mobility during ADLs: Moderate assistance, +2 for physical assistance, +2 for safety/equipment, Rolling walker (2 wheels) General ADL Comments: limited by impiared cognition, midline awareness and weakness. session  focused on OOB transfers and midline posture   Cognition: Cognition  Overall Cognitive Status: No family/caregiver present to determine baseline cognitive functioning Arousal/Alertness: Awake/alert Orientation Level: Oriented to person, Disoriented to place, Disoriented to time, Disoriented to situation Attention: Sustained Sustained Attention: Impaired Sustained Attention Impairment: Verbal basic, Functional basic Memory: Impaired Memory Impairment: Decreased recall of new information Awareness: Impaired Awareness Impairment: Intellectual impairment Problem Solving: Impaired Problem Solving Impairment: Functional basic Cognition Arousal: Lethargic Behavior During Therapy: Restless Overall Cognitive Status: No family/caregiver present to determine baseline cognitive functioning     Review of Systems  Constitutional:  Negative for fever.  HENT:  Negative for congestion.   Eyes:  Positive for blurred vision. Negative for double vision.  Respiratory:  Negative for shortness of breath.   Cardiovascular:  Negative for chest pain.  Gastrointestinal:  Positive for constipation. Negative for abdominal pain, diarrhea, nausea and vomiting.  Genitourinary: Negative.   Musculoskeletal:  Negative for back pain and joint pain.  Neurological:  Positive for speech change. Negative for sensory change and headaches.  Psychiatric/Behavioral:  The patient does not have insomnia.         Past Medical History:  Diagnosis Date   Aortic atherosclerosis (HCC)     Cervical spinal stenosis     Chronic kidney disease     COPD (chronic obstructive pulmonary disease) (HCC)     Coronary artery calcification seen on CT scan     ETOH abuse      a.) 28+ standard drinks/week   GERD (gastroesophageal reflux disease)     History of amiodarone  therapy      a.) in the past; self discontinued   History of medication noncompliance      a.) reported 10/2022 that he has been off NOAC for several months secondary  to vertiginous symptoms, not acting like himself, and depression; b.) off/not taking as prescribed: antiarrhythmics, antihypertensives, statins, and MDIs   Hyperlipidemia     Hypertension     Long term current use of anticoagulant      a.) rivaroxaban    PAF (paroxysmal atrial fibrillation) (HCC)      a.) CHA2DS2VASc = 3 (age, HTN, vascular disease history);  b.) rate/rhythm maintained on oral carvediolol (self discontinued diltiazem  + amiodarone  + metoprolol ); chronically anticoagulated with rivaroxaban  (switched from apixaban  11/17/2022)   Tobacco abuse               Past Surgical History:  Procedure Laterality Date   SHOULDER ARTHROSCOPY WITH SUBACROMIAL DECOMPRESSION, ROTATOR CUFF REPAIR AND BICEP TENDON REPAIR Left 12/07/2022    Procedure: SHOULDER ARTHROSCOPY WITH DEBRIDEMENT, DECOMPRESSION, ROTATOR CUFF REPAIR AND BICEPS TENODESIS.- RNFA;  Surgeon: Edie Norleen PARAS, MD;  Location: [facility] ORS;  Service: Orthopedics;  Laterality: Left;   SHOULDER ARTHROSCOPY WITH SUBACROMIAL DECOMPRESSION, ROTATOR CUFF REPAIR AND BICEP TENDON REPAIR Left 02/02/2023    Procedure: LEFT SHOULDER ARTHROSCOPY WITH DEBRIDEMENT AND REPAIR OF RECURRENT ROTATOR CUFF TEAR OF LEFT SHOULDER;  Surgeon: Edie Norleen PARAS, MD;  Location: [facility] OR[facility];  Service: Orthopedics;  Laterality: Left;   TONSILLECTOMY                 Family History  Problem Relation Age of Onset   Emphysema Father     CAD Father          CABG   Stroke Mother     CAD Brother 79        CABG   Hypertension Brother          Social History:  reports that he has been smoking cigarettes. He started smoking about  53 years ago. He has a 13.4 pack-year smoking history. He has quit using smokeless tobacco. He reports current alcohol use of about 28.0 standard drinks of alcohol per week. He reports current drug use. Drug: Marijuana. Allergies:  Allergies  No Known Allergies         Medications Prior to Admission  Medication Sig Dispense Refill    albuterol  (VENTOLIN  HFA) 108 (90 Base) MCG/ACT inhaler INHALE 2 PUFFS BY MOUTH EVERY 4 HOURS AS NEEDED FOR WHEEZE OR FOR SHORTNESS OF BREATH 18 each 5   famotidine  (PEPCID ) 40 MG tablet TAKE 1 TABLET EVERY DAY 90 tablet 3   hydrOXYzine  (VISTARIL ) 25 MG capsule TAKE 1 CAPSULE (25 MG TOTAL) BY MOUTH EVERY 6 (SIX) HOURS AS NEEDED. 360 capsule 1   TRELEGY ELLIPTA  100-62.5-25 MCG/ACT AEPB INHALE 1 PUFF EVERY DAY 180 each 0   lisinopril  (ZESTRIL ) 5 MG tablet TAKE 1 TABLET EVERY DAY (Patient not taking: Reported on 06/07/2024) 90 tablet 3            Blood pressure (!) 139/90, pulse 89, temperature 98.7 F (37.1 C), resp. rate 20, height 6' (1.829 m), weight 68.8 kg, SpO2 100%. Physical Exam   General: No apparent distress, chronically ill appearing, in restraints HEENT: Head is normocephalic, atraumatic, mucus membranes dry Neck: Supple without JVD or lymphadenopathy Heart: Reg rate and rhythm. No murmurs rubs or gallops Chest: CTA bilaterally without wheezes, rales, or rhonchi; no distress Abdomen: Soft, non-tender, non-distended, bowel sounds positive. Extremities: No clubbing, cyanosis, or edema. Pulses are 2+ Psych: Pt's affect is appropriate. Pt is cooperative Skin: Clean and intact without signs of breakdown Neuro:     Mental Status: Alert and awake, alert to person and situation, hospital not date, able to tell me his age, confused, often calling for his wife, poor safety awareness  Speech/Languate: Names 3/4 items, able to repeat partially,  following simple commands, + dysarthria CRANIAL NERVES: II: PERRL. Visual fields difficult to assess III, IV, VI: EOM intact, no gaze preference or deviation V: normal sensation bilaterally VII: mild R facial weakness VIII: normal hearing to speech IX, X: normal palatal elevation XI: 5/5 head turn and 5/5 shoulder shrug bilaterally XII: Tongue midline     MOTOR: RUE: 3/5 Deltoid, 4-/5 Biceps, 3/5 Triceps,4/5 Grip LUE: 4/5 Deltoid, 4+/5  Biceps, 4+/5 Triceps, 4+/5 Grip RLE: HF 4/5, KE 4+/5, ADF 4-/5, APF 4/5 LLE: HF 4+/5, KE 4+/5, ADF 4/5, APF 4+/5   SENSORY: Normal to touch all 4 extremities   Coordination: Normal finger to nose slow right more than left       Lab Results Last 24 Hours       Results for orders placed or performed during the hospital encounter of 06/06/24 (from the past 24 hours)  Basic metabolic panel with GFR     Status: Abnormal    Collection Time: 06/11/24  4:45 AM  Result Value Ref Range    Sodium 135 135 - 145 mmol/L    Potassium 3.6 3.5 - 5.1 mmol/L    Chloride 105 98 - 111 mmol/L    CO2 20 (L) 22 - 32 mmol/L    Glucose, Bld 96 70 - 99 mg/dL    BUN 7 (L) 8 - 23 mg/dL    Creatinine, Ser 9.13 0.61 - 1.24 mg/dL    Calcium  8.8 (L) 8.9 - 10.3 mg/dL    GFR, Estimated >39 >39 mL/min    Anion gap 10 5 - 15  Magnesium   Status: None    Collection Time: 06/11/24  4:45 AM  Result Value Ref Range    Magnesium  1.8 1.7 - 2.4 mg/dL       Imaging Results (Last 48 hours)  VAS US  UPPER EXTREMITY VENOUS DUPLEX Result Date: 06/10/2024 UPPER VENOUS STUDY  Patient Name:  TRISTIAN SICKINGER  Date of Exam:   06/09/2024 Medical Rec #: [id number]       Accession #:    [id number] Date of Birth: 11-28-1956       Patient Gender: M Patient Age:   66 years Exam Location:  [facility] Procedure:      VAS US  UPPER EXTREMITY VENOUS DUPLEX Referring Phys: ARY XU --------------------------------------------------------------------------------  Indications: Swelling Risk Factors: None identified. Comparison Study: No prior studies. Performing Technologist: Cordella Collet RVT  Examination Guidelines: A complete evaluation includes B-mode imaging, spectral Doppler, color Doppler, and power Doppler as needed of all accessible portions of each vessel. Bilateral testing is considered an integral part of a complete examination. Limited examinations for reoccurring indications may be performed as noted.  Right Findings:  +----------+------------+---------+-----------+----------+-------+ RIGHT     CompressiblePhasicitySpontaneousPropertiesSummary +----------+------------+---------+-----------+----------+-------+ IJV           Full       Yes       Yes                      +----------+------------+---------+-----------+----------+-------+ Subclavian               Yes       Yes                      +----------+------------+---------+-----------+----------+-------+ Axillary      Full       Yes       Yes                      +----------+------------+---------+-----------+----------+-------+ Brachial      Full                                          +----------+------------+---------+-----------+----------+-------+ Radial        Full                                          +----------+------------+---------+-----------+----------+-------+ Ulnar         Full                                          +----------+------------+---------+-----------+----------+-------+ Cephalic      Full                                          +----------+------------+---------+-----------+----------+-------+ Basilic       Full                                          +----------+------------+---------+-----------+----------+-------+  Left Findings: +----------+------------+---------+-----------+----------+-------+ LEFT      CompressiblePhasicitySpontaneousPropertiesSummary +----------+------------+---------+-----------+----------+-------+ Subclavian  Yes       Yes                      +----------+------------+---------+-----------+----------+-------+  Summary:  Right: No evidence of deep vein thrombosis in the upper extremity. No evidence of superficial vein thrombosis in the upper extremity.  Left: No evidence of thrombosis in the subclavian.  *See table(s) above for measurements and observations.  Diagnosing physician: Lonni Gaskins MD Electronically signed by  Lonni Gaskins MD on 06/10/2024 at 8:20:12 AM.    Final     ECHOCARDIOGRAM COMPLETE Result Date: 06/09/2024    ECHOCARDIOGRAM REPORT   Patient Name:   CHANC KERVIN Date of Exam: 06/09/2024 Medical Rec #:  [id number]      Height:       72.0 in Accession #:    [id number]     Weight:       151.7 lb Date of Birth:  02/21/1956      BSA:          1.894 m Patient Age:    67 years       BP:           98/73 mmHg Patient Gender: M              HR:           85 bpm. Exam Location:  Inpatient Procedure: 2D Echo, Cardiac Doppler and Color Doppler (Both Spectral and Color            Flow Doppler were utilized during procedure). Indications:    Stroke I63.9  History:        Patient has no prior history of Echocardiogram examinations.                 Stroke and COPD, Arrythmias:Atrial Fibrillation,                 Signs/Symptoms:Hypotension; Risk Factors:Hypertension,                 Dyslipidemia and Current Smoker.  Sonographer:    Thea Norlander RCS Referring Phys: ERIN C LEHNER  Sonographer Comments: Image acquisition challenging due to uncooperative patient. IMPRESSIONS  1. Left ventricular ejection fraction, by estimation, is 60 to 65%. The left ventricle has normal function. The left ventricle has no regional wall motion abnormalities. There is severe asymmetric left ventricular hypertrophy of the septal segment. Left  ventricular diastolic parameters are consistent with Grade I diastolic dysfunction (impaired relaxation).  2. Right ventricular systolic function is normal. The right ventricular size is normal.  3. A small pericardial effusion is present. The pericardial effusion is localized near the right ventricle. There is no evidence of cardiac tamponade.  4. The mitral valve is abnormal. Trivial mitral valve regurgitation.  5. The aortic valve was not well visualized. Aortic valve regurgitation is not visualized. Aortic valve sclerosis/calcification is present, without any evidence of aortic stenosis.  Comparison(s): No prior Echocardiogram. FINDINGS  Left Ventricle: Left ventricular ejection fraction, by estimation, is 60 to 65%. The left ventricle has normal function. The left ventricle has no regional wall motion abnormalities. The left ventricular internal cavity size was normal in size. There is  severe asymmetric left ventricular hypertrophy of the septal segment. Left ventricular diastolic parameters are consistent with Grade I diastolic dysfunction (impaired relaxation). Indeterminate filling pressures. Right Ventricle: The right ventricular size is normal. No increase in right ventricular wall thickness. Right ventricular systolic function is normal. Left Atrium: Left atrial size  was normal in size. Right Atrium: Right atrial size was normal in size. Pericardium: A small pericardial effusion is present. The pericardial effusion is localized near the right ventricle. There is no evidence of cardiac tamponade. Mitral Valve: The mitral valve is abnormal. Mild mitral annular calcification. Trivial mitral valve regurgitation. Tricuspid Valve: The tricuspid valve is not well visualized. Tricuspid valve regurgitation is not demonstrated. Aortic Valve: The aortic valve was not well visualized. Aortic valve regurgitation is not visualized. Aortic valve sclerosis/calcification is present, without any evidence of aortic stenosis. Aortic valve peak gradient measures 8.8 mmHg. Pulmonic Valve: The pulmonic valve was not well visualized. Pulmonic valve regurgitation is not visualized. Aorta: The aortic root and ascending aorta are structurally normal, with no evidence of dilitation. IAS/Shunts: The interatrial septum was not well visualized.  LEFT VENTRICLE PLAX 2D LVIDd:         3.00 cm Diastology LVIDs:         2.10 cm LV e' medial:    5.00 cm/s LV PW:         1.10 cm LV E/e' medial:  11.9 LV IVS:        1.70 cm LV e' lateral:   8.49 cm/s                        LV E/e' lateral: 7.0  RIGHT VENTRICLE RV S prime:      18.60 cm/s TAPSE (M-mode): 2.7 cm LEFT ATRIUM             Index        RIGHT ATRIUM           Index LA diam:        3.60 cm 1.90 cm/m   RA Area:     13.30 cm LA Vol (A2C):   43.8 ml 23.12 ml/m  RA Volume:   26.30 ml  13.88 ml/m LA Vol (A4C):   54.0 ml 28.51 ml/m LA Biplane Vol: 52.1 ml 27.50 ml/m  AORTIC VALVE AV Vmax:      148.00 cm/s AV Peak Grad: 8.8 mmHg LVOT Vmax:    98.07 cm/s LVOT Vmean:   65.167 cm/s LVOT VTI:     0.154 m MITRAL VALVE MV Area (PHT): 2.50 cm    SHUNTS MV Decel Time: 303 msec    Systemic VTI: 0.15 m MV E velocity: 59.70 cm/s MV A velocity: 84.40 cm/s MV E/A ratio:  0.71 Vinie Maxcy MD Electronically signed by Vinie Maxcy MD Signature Date/Time: 06/09/2024/4:12:17 PM    Final     ECHOCARDIOGRAM COMPLETE Result Date: 06/07/2024    ECHOCARDIOGRAM REPORT   Patient Name:   ISABEL FREESE Date of Exam: 06/07/2024 Medical Rec #:  [id number]      Height:       72.0 in Accession #:    [id number]     Weight:       151.7 lb Date of Birth:  Jan 18, 1956      BSA:          1.894 m Patient Age:    67 years       BP:           11/11 mmHg Patient Gender: M              HR:           1 bpm. Exam Location:  Inpatient Procedure: 2D Echo (Both Spectral and Color Flow Doppler were utilized during  procedure). Indications:    stroke  History:        Patient has no prior history of Echocardiogram examinations.  Sonographer:    Koleen Popper Referring Phys: [id number] SRISHTI L BHAGAT IMPRESSIONS  1. Echo not perfomred due to patient non compliance.  2. Left ventricular ejection fraction, by estimation, is Not done%. The left ventricle has not done function. Left ventricular endocardial border not optimally defined to evaluate regional wall motion. The left ventricular internal cavity size was not done. Left ventricular diastolic function could not be evaluated.  3. Right ventricular systolic function was not well visualized. The right ventricular size is not well visualized.  4. The mitral valve was  not assessed. No evidence of mitral valve regurgitation.  5. The aortic valve was not assessed. Aortic valve regurgitation is not visualized.  6. Aortic Not imaged. FINDINGS  Left Ventricle: Left ventricular ejection fraction, by estimation, is Not done%. The left ventricle has not done function. Left ventricular endocardial border not optimally defined to evaluate regional wall motion. Strain was performed and the global longitudinal strain is indeterminate. The left ventricular internal cavity size was not done. Suboptimal image quality limits for assessment of left ventricular hypertrophy. Left ventricular diastolic function could not be evaluated. Right Ventricle: The right ventricular size is not well visualized. Right vetricular wall thickness was not assessed. Right ventricular systolic function was not well visualized. Left Atrium: Left atrial size was not assessed. Right Atrium: Right atrial size was not assessed. Pericardium: The pericardium was not assessed. Mitral Valve: The mitral valve was not assessed. No evidence of mitral valve regurgitation. Tricuspid Valve: The tricuspid valve is not assessed. Tricuspid valve regurgitation is not demonstrated. Aortic Valve: The aortic valve was not assessed. Aortic valve regurgitation is not visualized. Pulmonic Valve: The pulmonic valve was not assessed. Pulmonic valve regurgitation is not visualized. Aorta: Not imaged and the ascending aorta was not well visualized. IAS/Shunts: The interatrial septum was not assessed. Additional Comments: Echo not perfomred due to patient non compliance. 3D was performed not requiring image post processing on an independent workstation and was indeterminate. Maude Emmer MD Electronically signed by Maude Emmer MD Signature Date/Time: 06/07/2024/3:38:19 PM    Final        Assessment/Plan: Diagnosis: L MCA, L PCA scattered infracts Does the need for close, 24 hr/day medical supervision in concert with the patient's rehab  needs make it unreasonable for this patient to be served in a less intensive setting? Yes Co-Morbidities requiring supervision/potential complications:  -Carotid stenosis, Afib, HTN, polysubstance abuse, Dysphagia, Medication noncompliance, HLD, Tobacco use Due to bladder management, bowel management, safety, skin/wound care, disease management, medication administration, pain management, and patient education, does the patient require 24 hr/day rehab nursing? Yes Does the patient require coordinated care of a physician, rehab nurse, therapy disciplines of PT/OT/SLP to address physical and functional deficits in the context of the above medical diagnosis(es)? Yes Addressing deficits in the following areas: balance, endurance, locomotion, strength, transferring, bowel/bladder control, bathing, dressing, feeding, grooming, toileting, cognition, speech, language, swallowing, and psychosocial support Can the patient actively participate in an intensive therapy program of at least 3 hrs of therapy per day at least 5 days per week? Yes The potential for patient to make measurable gains while on inpatient rehab is excellent Anticipated functional outcomes upon discharge from inpatient rehab are supervision and min assist  with PT, supervision and min assist with OT, supervision and min assist with SLP. Estimated rehab length of stay to reach the  above functional goals is: 10-14 Anticipated discharge destination: Home Overall Rehab/Functional Prognosis: good   POST ACUTE RECOMMENDATIONS: This patient's condition is appropriate for continued rehabilitative care in the following setting: CIR Patient has agreed to participate in recommended program. Yes Note that insurance prior authorization may be required for reimbursement for recommended care.   Comment: Pt would be candidate for CIR when medically stable.  Currently there is plan for tenative L carotid endarterectomy on Thursday. Would like to confirm  disposition plan/family support.      I have personally performed a face to face diagnostic evaluation of this patient. Additionally, I have examined the patient's medical record including any pertinent labs and radiographic images.

## 2024-06-11 NOTE — Care Management Important Message (Signed)
 Important Message  Patient Details  Name: Ronald Lowe MRN: [id number] Date of Birth: [birth year]   Important Message Given:  Yes - Medicare IM     Jon Cruel 06/11/2024, 12:02 PM

## 2024-06-11 NOTE — Progress Notes (Signed)
 [facility]  Subjective  -alert and oriented x 0 and in restraints   Objective (!) 195/106 92 97.6 F (36.4 C) (Oral) 18 100%  Intake/Output Summary (Last 24 hours) at 06/11/2024 0634 Last data filed at 06/10/2024 1827 Gross per 24 hour  Intake 771.59 ml  Output 500 ml  Net 271.59 ml    Appears to be grossly moving all extremities but limited exam with him in restraints  Laboratory Lab Results: Recent Labs    06/08/24 0653 06/10/24 0456  WBC 9.0 6.1  HGB 16.8 14.2  HCT 46.4 39.2  PLT 178 193   BMET Recent Labs    06/10/24 0456 06/11/24 0445  NA 137 135  K 3.3* 3.6  CL 109 105  CO2 16* 20*  GLUCOSE 84 96  BUN 12 7*  CREATININE 1.09 0.86  CALCIUM  8.7* 8.8*    COAG Lab Results  Component Value Date   INR 1.0 06/06/2024   INR 1.0 11/17/2021   INR 1.0 04/07/2021   No results found for: PTT  Assessment/Planning:  68 year old male that vascular surgery was consulted for symptomatic left carotid stenosis.  Has high-grade left ICA stenosis >80% with calcified plaque with left brain infarct on MRI.  Tentative plan is left carotid endarterectomy on Thursday with Dr. [name] for stroke risk reduction.  Unfortunately today he is alert and oriented x 0 and in restraints this morning.  I would like to see his mental status improved prior to Thursday.  Lonni JINNY Gaskins 06/11/2024 6:34 AM --

## 2024-06-11 NOTE — Progress Notes (Signed)
 Physical Therapy Treatment Patient Details Name: Ronald Lowe MRN: [id number] DOB: 1956/02/09 Today's Date: 06/11/2024   History of Present Illness Pt is a 68 y.o. male admitted 6/19. He presented to the ED with confusion, R side weakness, slurred speech, and L gaze. MRI revealed multiple acute infarcts in the L MCA and left PCA territories. PMH: daily alcohol use, HTN, a fib not on anticoagulation (secondary to cost per family, per chart review patient reported not liking how the medication made him feel), ongoing smoking, hep C    PT Comments  Pt received in supine and agreeable to session. Pt slightly more oriented and aware this session. Pt demonstrates improved sitting and standing balance with reduced R lateral lean. Pt able to take lateral steps at EOB with minA, but demonstrates increased difficulty with R foot advancement. Pt able to demonstrate static standing marches with assist for balance. Pt demonstrates impaired RUE support on RW with tendency to pull up requiring assist to stabilize RW. Pt able to tolerate serial STS with progression to CGA with improvement in anterior lean. Pt continues to benefit from PT services to progress toward functional mobility goals.     If plan is discharge home, recommend the following: Two people to help with walking and/or transfers;Two people to help with bathing/dressing/bathroom;Assistance with cooking/housework;Assist for transportation   Can travel by private vehicle        Equipment Recommendations  Other (comment) (TBD)    Recommendations for Other Services       Precautions / Restrictions Precautions Precautions: Fall;Other (comment) Recall of Precautions/Restrictions: Impaired Precaution/Restrictions Comments: posey belt, wrist restraints Restrictions Weight Bearing Restrictions Per Provider Order: No     Mobility  Bed Mobility Overal bed mobility: Needs Assistance Bed Mobility: Supine to Sit, Sit to Supine     Supine to  sit: Mod assist     General bed mobility comments: cues for sequencing and mod A for trunk elevation and BLE elevation back to EOB    Transfers Overall transfer level: Needs assistance Equipment used: Rolling walker (2 wheels) Transfers: Sit to/from Stand Sit to Stand: Min assist, +2 physical assistance, Contact guard assist, +2 safety/equipment           General transfer comment: From EOB x6 with min A +2 progressing to CGA with cues for anterior lean and hand placement. Increased time for power up due to LE weakness. Pt able to lateral step at EOB with min A for balance and RW management and cues for sequencing.    Ambulation/Gait               General Gait Details: unable to progress due to IV hooked to the bed   Stairs             Wheelchair Mobility     Tilt Bed    Modified Rankin (Stroke Patients Only) Modified Rankin (Stroke Patients Only) Pre-Morbid Rankin Score: No symptoms Modified Rankin: Severe disability     Balance Overall balance assessment: Needs assistance Sitting-balance support: Feet supported Sitting balance-Leahy Scale: Poor Sitting balance - Comments: sitting EOB with CGA   Standing balance support: Bilateral upper extremity supported, During functional activity, Reliant on assistive device for balance Standing balance-Leahy Scale: Poor Standing balance comment: with RW and min A                            Communication Communication Communication: Impaired Factors Affecting Communication: Difficulty expressing self;Reduced clarity  of speech  Cognition Arousal: Alert Behavior During Therapy: WFL for tasks assessed/performed   PT - Cognitive impairments: No family/caregiver present to determine baseline, Difficult to assess, Orientation, Awareness, Memory, Attention, Problem solving, Safety/Judgement Difficult to assess due to: Impaired communication Orientation impairments: Situation                      Following commands: Impaired Following commands impaired: Follows one step commands inconsistently, Follows one step commands with increased time    Cueing Cueing Techniques: Verbal cues, Gestural cues, Tactile cues  Exercises General Exercises - Lower Extremity Hip Flexion/Marching: AROM, Standing, Both, 10 reps Other Exercises Other Exercises: x5 serial STS    General Comments        Pertinent Vitals/Pain Pain Assessment Pain Assessment: Faces Faces Pain Scale: Hurts little more Pain Location: R shoulder Pain Descriptors / Indicators: Discomfort, Grimacing, Guarding Pain Intervention(s): Limited activity within patient's tolerance, Monitored during session     PT Goals (current goals can now be found in the care plan section) Acute Rehab PT Goals Patient Stated Goal: unable to state PT Goal Formulation: Patient unable to participate in goal setting Time For Goal Achievement: 06/21/24 Progress towards PT goals: Progressing toward goals    Frequency    Min 3X/week       AM-PAC PT 6 Clicks Mobility   Outcome Measure  Help needed turning from your back to your side while in a flat bed without using bedrails?: A Little Help needed moving from lying on your back to sitting on the side of a flat bed without using bedrails?: A Lot Help needed moving to and from a bed to a chair (including a wheelchair)?: A Lot Help needed standing up from a chair using your arms (e.g., wheelchair or bedside chair)?: A Lot Help needed to walk in hospital room?: Total Help needed climbing 3-5 steps with a railing? : Total 6 Click Score: 11    End of Session Equipment Utilized During Treatment: Gait belt Activity Tolerance: Patient tolerated treatment well Patient left: in bed;with call bell/phone within reach;with restraints reapplied;with bed alarm set Nurse Communication: Mobility status PT Visit Diagnosis: Other abnormalities of gait and mobility (R26.89);Hemiplegia and  hemiparesis Hemiplegia - Right/Left: Right Hemiplegia - caused by: Cerebral infarction     Time: 1137-1202 PT Time Calculation (min) (ACUTE ONLY): 25 min  Charges:    $Therapeutic Activity: 23-37 mins PT General Charges $$ ACUTE PT VISIT: 1 Visit                     Darryle George, PTA Acute Rehabilitation Services Secure Chat Preferred  [phone]    Darryle George 06/11/2024, 2:39 PM

## 2024-06-11 NOTE — Plan of Care (Signed)
  Problem: Education: Goal: Knowledge of disease or condition will improve 06/11/2024 1220 by Berkeley Verdie BRAVO, RN Outcome: Progressing 06/11/2024 1219 by Berkeley Verdie BRAVO, RN Outcome: Progressing   Problem: Ischemic Stroke/TIA Tissue Perfusion: Goal: Complications of ischemic stroke/TIA will be minimized 06/11/2024 1220 by Berkeley Verdie BRAVO, RN Outcome: Progressing 06/11/2024 1219 by Berkeley Verdie BRAVO, RN Outcome: Progressing   Problem: Health Behavior/Discharge Planning: Goal: Goals will be collaboratively established with patient/family Outcome: Progressing   Problem: Nutrition: Goal: Dietary intake will improve Outcome: Progressing

## 2024-06-11 NOTE — Progress Notes (Deleted)
 Physical Medicine and Rehabilitation Consult Reason for Consult:Rehab Referring Physician: Dr. Gretta   HPI: Ronald Lowe is a 68 y.o. male with PMH of polysubstance abuse, atrial fibrillation, hyperlipidemia, COPD, tobacco use, hypertension, A-fib noncompliant with anticoagulation, GERD, CKD, COPD who presented to [facility], ED after he was found confused with right arm weakness.  MRI of the brain showed multiple acute infarcts in left MCA and left PCA territories, neurology suspects likely due to symptomatic proximal left ICA stenosis.  CTA with occluded proximal left M3 MCA, critical stenosis of left carotid bifurcation, stenosis of right ICA, right V2 vertebral artery stenosis due to compression from osteophyte.  Patient had UDS with THC, cocaine, benzos, amphetamines.  He also history of alcohol and tobacco abuse.  Patient is scheduled tentatively for left carotid endarterectomy on Thursday with Dr. [name].  He is on at D2 thin diet for dysphagia.  Seen by OT and PT and found to have functional deficit felt to be candidate for CIR.  Per chart review lives in 1 level home with 2 steps to enter, family able to assist 24 hours/day after discharge.     Home: Home Living Family/patient expects to be discharged to:: Private residence Living Arrangements: Spouse/significant other Available Help at Discharge: Family, Available 24 hours/day Type of Home: House Home Access: Stairs to enter [employer] of Steps: 2 Entrance Stairs-Rails: Right, Left Home Layout: One level Bathroom Shower/Tub: Engineer, manufacturing systems: Standard Bathroom Accessibility: Yes Home Equipment: None Additional Comments: Pt unable to provide hx. Most info obtained from previous admission (2022).  Lives With: Spouse  Functional History: Prior Function Prior Level of Function : Independent/Modified Independent, Patient poor historian/Family not available, Working/employed Mobility Comments:  3 falls Functional Status:  Mobility: Bed Mobility Overal bed mobility: Needs Assistance Bed Mobility: Supine to Sit, Sit to Supine Supine to sit: Mod assist, +2 for safety/equipment, +2 for physical assistance Sit to supine: Mod assist, +2 for physical assistance, +2 for safety/equipment General bed mobility comments: assist for BLE advancement and trunk management. increased cues and time Transfers Overall transfer level: Needs assistance Equipment used: Rolling walker (2 wheels) Transfers: Sit to/from Stand Sit to Stand: Mod assist, +2 physical assistance, +2 safety/equipment General transfer comment: From EOB with mod A +2 for power up and balance due to R lateral lean. Pt able to take a few side steps towards HOB with max A +2 for weight shifting and advancing feet. Pt more pivoting feet rather than clearing. Increased R lean with prolonged standing and stepping. Ambulation/Gait General Gait Details: unable to progress    ADL: ADL Overall ADL's : Needs assistance/impaired Eating/Feeding: NPO Grooming: Moderate assistance Grooming Details (indicate cue type and reason): unable to bring hand all the way to his face Upper Body Bathing: Cueing for safety Lower Body Bathing: Total assistance, +2 for physical assistance, +2 for safety/equipment Upper Body Dressing : Maximal assistance Lower Body Dressing: +2 for physical assistance, Total assistance, +2 for safety/equipment Toilet Transfer: Maximal assistance, +2 for physical assistance, +2 for safety/equipment Toilet Transfer Details (indicate cue type and reason): simualated at the EOB Toileting- Clothing Manipulation and Hygiene: Total assistance, +2 for physical assistance, +2 for safety/equipment Functional mobility during ADLs: Moderate assistance, +2 for physical assistance, +2 for safety/equipment, Rolling walker (2 wheels) General ADL Comments: limited by impiared cognition, midline awareness and weakness. session focused on  OOB transfers and midline posture  Cognition: Cognition Overall Cognitive Status: No family/caregiver present to determine baseline cognitive  functioning Arousal/Alertness: Awake/alert Orientation Level: Oriented to person, Disoriented to place, Disoriented to time, Disoriented to situation Attention: Sustained Sustained Attention: Impaired Sustained Attention Impairment: Verbal basic, Functional basic Memory: Impaired Memory Impairment: Decreased recall of new information Awareness: Impaired Awareness Impairment: Intellectual impairment Problem Solving: Impaired Problem Solving Impairment: Functional basic Cognition Arousal: Lethargic Behavior During Therapy: Restless Overall Cognitive Status: No family/caregiver present to determine baseline cognitive functioning   Review of Systems  Constitutional:  Negative for fever.  HENT:  Negative for congestion.   Eyes:  Positive for blurred vision. Negative for double vision.  Respiratory:  Negative for shortness of breath.   Cardiovascular:  Negative for chest pain.  Gastrointestinal:  Positive for constipation. Negative for abdominal pain, diarrhea, nausea and vomiting.  Genitourinary: Negative.   Musculoskeletal:  Negative for back pain and joint pain.  Neurological:  Positive for speech change. Negative for sensory change and headaches.  Psychiatric/Behavioral:  The patient does not have insomnia.    Past Medical History:  Diagnosis Date   Aortic atherosclerosis (HCC)    Cervical spinal stenosis    Chronic kidney disease    COPD (chronic obstructive pulmonary disease) (HCC)    Coronary artery calcification seen on CT scan    ETOH abuse    a.) 28+ standard drinks/week   GERD (gastroesophageal reflux disease)    History of amiodarone  therapy    a.) in the past; self discontinued   History of medication noncompliance    a.) reported 10/2022 that he has been off NOAC for several months secondary to vertiginous symptoms, not  acting like himself, and depression; b.) off/not taking as prescribed: antiarrhythmics, antihypertensives, statins, and MDIs   Hyperlipidemia    Hypertension    Long term current use of anticoagulant    a.) rivaroxaban    PAF (paroxysmal atrial fibrillation) (HCC)    a.) CHA2DS2VASc = 3 (age, HTN, vascular disease history);  b.) rate/rhythm maintained on oral carvediolol (self discontinued diltiazem  + amiodarone  + metoprolol ); chronically anticoagulated with rivaroxaban  (switched from apixaban  11/17/2022)   Tobacco abuse    Past Surgical History:  Procedure Laterality Date   SHOULDER ARTHROSCOPY WITH SUBACROMIAL DECOMPRESSION, ROTATOR CUFF REPAIR AND BICEP TENDON REPAIR Left 12/07/2022   Procedure: SHOULDER ARTHROSCOPY WITH DEBRIDEMENT, DECOMPRESSION, ROTATOR CUFF REPAIR AND BICEPS TENODESIS.- RNFA;  Surgeon: Edie Norleen PARAS, MD;  Location: [facility] ORS;  Service: Orthopedics;  Laterality: Left;   SHOULDER ARTHROSCOPY WITH SUBACROMIAL DECOMPRESSION, ROTATOR CUFF REPAIR AND BICEP TENDON REPAIR Left 02/02/2023   Procedure: LEFT SHOULDER ARTHROSCOPY WITH DEBRIDEMENT AND REPAIR OF RECURRENT ROTATOR CUFF TEAR OF LEFT SHOULDER;  Surgeon: Edie Norleen PARAS, MD;  Location: [facility] OR[facility];  Service: Orthopedics;  Laterality: Left;   TONSILLECTOMY     Family History  Problem Relation Age of Onset   Emphysema Father    CAD Father        CABG   Stroke Mother    CAD Brother 32       CABG   Hypertension Brother    Social History:  reports that he has been smoking cigarettes. He started smoking about 53 years ago. He has a 13.4 pack-year smoking history. He has quit using smokeless tobacco. He reports current alcohol use of about 28.0 standard drinks of alcohol per week. He reports current drug use. Drug: Marijuana. Allergies: No Known Allergies Medications Prior to Admission  Medication Sig Dispense Refill   albuterol  (VENTOLIN  HFA) 108 (90 Base) MCG/ACT inhaler INHALE 2 PUFFS BY MOUTH EVERY 4 HOURS AS NEEDED FOR  WHEEZE OR FOR SHORTNESS OF BREATH 18 each 5   famotidine  (PEPCID ) 40 MG tablet TAKE 1 TABLET EVERY DAY 90 tablet 3   hydrOXYzine  (VISTARIL ) 25 MG capsule TAKE 1 CAPSULE (25 MG TOTAL) BY MOUTH EVERY 6 (SIX) HOURS AS NEEDED. 360 capsule 1   TRELEGY ELLIPTA  100-62.5-25 MCG/ACT AEPB INHALE 1 PUFF EVERY DAY 180 each 0   lisinopril  (ZESTRIL ) 5 MG tablet TAKE 1 TABLET EVERY DAY (Patient not taking: Reported on 06/07/2024) 90 tablet 3     Blood pressure (!) 139/90, pulse 89, temperature 98.7 F (37.1 C), resp. rate 20, height 6' (1.829 m), weight 68.8 kg, SpO2 100%. Physical Exam  General: No apparent distress, chronically ill appearing, in restraints HEENT: Head is normocephalic, atraumatic, mucus membranes dry Neck: Supple without JVD or lymphadenopathy Heart: Reg rate and rhythm. No murmurs rubs or gallops Chest: CTA bilaterally without wheezes, rales, or rhonchi; no distress Abdomen: Soft, non-tender, non-distended, bowel sounds positive. Extremities: No clubbing, cyanosis, or edema. Pulses are 2+ Psych: Pt's affect is appropriate. Pt is cooperative Skin: Clean and intact without signs of breakdown Neuro:    Mental Status: Alert and awake, alert to person and situation, hospital not date, able to tell me his age, confused, often calling for his wife, poor safety awareness  Speech/Languate: Names 3/4 items, able to repeat partially,  following simple commands, + dysarthria CRANIAL NERVES: II: PERRL. Visual fields difficult to assess III, IV, VI: EOM intact, no gaze preference or deviation V: normal sensation bilaterally VII: mild R facial weakness VIII: normal hearing to speech IX, X: normal palatal elevation XI: 5/5 head turn and 5/5 shoulder shrug bilaterally XII: Tongue midline   MOTOR: RUE: 3/5 Deltoid, 4-/5 Biceps, 3/5 Triceps,4/5 Grip LUE: 4/5 Deltoid, 4+/5 Biceps, 4+/5 Triceps, 4+/5 Grip RLE: HF 4/5, KE 4+/5, ADF 4-/5, APF 4/5 LLE: HF 4+/5, KE 4+/5, ADF 4/5, APF  4+/5  SENSORY: Normal to touch all 4 extremities  Coordination: Normal finger to nose slow right more than left    Results for orders placed or performed during the hospital encounter of 06/06/24 (from the past 24 hours)  Basic metabolic panel with GFR     Status: Abnormal   Collection Time: 06/11/24  4:45 AM  Result Value Ref Range   Sodium 135 135 - 145 mmol/L   Potassium 3.6 3.5 - 5.1 mmol/L   Chloride 105 98 - 111 mmol/L   CO2 20 (L) 22 - 32 mmol/L   Glucose, Bld 96 70 - 99 mg/dL   BUN 7 (L) 8 - 23 mg/dL   Creatinine, Ser 9.13 0.61 - 1.24 mg/dL   Calcium  8.8 (L) 8.9 - 10.3 mg/dL   GFR, Estimated >39 >39 mL/min   Anion gap 10 5 - 15  Magnesium      Status: None   Collection Time: 06/11/24  4:45 AM  Result Value Ref Range   Magnesium  1.8 1.7 - 2.4 mg/dL   VAS US  UPPER EXTREMITY VENOUS DUPLEX Result Date: 06/10/2024 UPPER VENOUS STUDY  Patient Name:  Ronald Lowe  Date of Exam:   06/09/2024 Medical Rec #: [id number]       Accession #:    [id number] Date of Birth: 1956-03-14       Patient Gender: M Patient Age:   54 years Exam Location:  [facility] Procedure:      VAS US  UPPER EXTREMITY VENOUS DUPLEX Referring Phys: ARY XU --------------------------------------------------------------------------------  Indications: Swelling Risk Factors: None identified. Comparison Study: No prior studies.  Performing Technologist: Cordella Collet RVT  Examination Guidelines: A complete evaluation includes B-mode imaging, spectral Doppler, color Doppler, and power Doppler as needed of all accessible portions of each vessel. Bilateral testing is considered an integral part of a complete examination. Limited examinations for reoccurring indications may be performed as noted.  Right Findings: +----------+------------+---------+-----------+----------+-------+ RIGHT     CompressiblePhasicitySpontaneousPropertiesSummary +----------+------------+---------+-----------+----------+-------+ IJV            Full       Yes       Yes                      +----------+------------+---------+-----------+----------+-------+ Subclavian               Yes       Yes                      +----------+------------+---------+-----------+----------+-------+ Axillary      Full       Yes       Yes                      +----------+------------+---------+-----------+----------+-------+ Brachial      Full                                          +----------+------------+---------+-----------+----------+-------+ Radial        Full                                          +----------+------------+---------+-----------+----------+-------+ Ulnar         Full                                          +----------+------------+---------+-----------+----------+-------+ Cephalic      Full                                          +----------+------------+---------+-----------+----------+-------+ Basilic       Full                                          +----------+------------+---------+-----------+----------+-------+  Left Findings: +----------+------------+---------+-----------+----------+-------+ LEFT      CompressiblePhasicitySpontaneousPropertiesSummary +----------+------------+---------+-----------+----------+-------+ Subclavian               Yes       Yes                      +----------+------------+---------+-----------+----------+-------+  Summary:  Right: No evidence of deep vein thrombosis in the upper extremity. No evidence of superficial vein thrombosis in the upper extremity.  Left: No evidence of thrombosis in the subclavian.  *See table(s) above for measurements and observations.  Diagnosing physician: Lonni Gaskins MD Electronically signed by Lonni Gaskins MD on 06/10/2024 at 8:20:12 AM.    Final    ECHOCARDIOGRAM COMPLETE Result Date: 06/09/2024    ECHOCARDIOGRAM REPORT   Patient Name:   Ronald Lowe Date of Exam: 06/09/2024 Medical Rec #:  [id number]      Height:       72.0 in Accession #:    [id number]     Weight:       151.7 lb Date of Birth:  [birth year]      BSA:          1.894 m Patient Age:    67 years       BP:           98/73 mmHg Patient Gender: M              HR:           85 bpm. Exam Location:  Inpatient Procedure: 2D Echo, Cardiac Doppler and Color Doppler (Both Spectral and Color            Flow Doppler were utilized during procedure). Indications:    Stroke I63.9  History:        Patient has no prior history of Echocardiogram examinations.                 Stroke and COPD, Arrythmias:Atrial Fibrillation,                 Signs/Symptoms:Hypotension; Risk Factors:Hypertension,                 Dyslipidemia and Current Smoker.  Sonographer:    Thea Norlander RCS Referring Phys: ERIN C LEHNER  Sonographer Comments: Image acquisition challenging due to uncooperative patient. IMPRESSIONS  1. Left ventricular ejection fraction, by estimation, is 60 to 65%. The left ventricle has normal function. The left ventricle has no regional wall motion abnormalities. There is severe asymmetric left ventricular hypertrophy of the septal segment. Left  ventricular diastolic parameters are consistent with Grade I diastolic dysfunction (impaired relaxation).  2. Right ventricular systolic function is normal. The right ventricular size is normal.  3. A small pericardial effusion is present. The pericardial effusion is localized near the right ventricle. There is no evidence of cardiac tamponade.  4. The mitral valve is abnormal. Trivial mitral valve regurgitation.  5. The aortic valve was not well visualized. Aortic valve regurgitation is not visualized. Aortic valve sclerosis/calcification is present, without any evidence of aortic stenosis. Comparison(s): No prior Echocardiogram. FINDINGS  Left Ventricle: Left ventricular ejection fraction, by estimation, is 60 to 65%. The left ventricle has normal function. The left ventricle has no regional wall motion  abnormalities. The left ventricular internal cavity size was normal in size. There is  severe asymmetric left ventricular hypertrophy of the septal segment. Left ventricular diastolic parameters are consistent with Grade I diastolic dysfunction (impaired relaxation). Indeterminate filling pressures. Right Ventricle: The right ventricular size is normal. No increase in right ventricular wall thickness. Right ventricular systolic function is normal. Left Atrium: Left atrial size was normal in size. Right Atrium: Right atrial size was normal in size. Pericardium: A small pericardial effusion is present. The pericardial effusion is localized near the right ventricle. There is no evidence of cardiac tamponade. Mitral Valve: The mitral valve is abnormal. Mild mitral annular calcification. Trivial mitral valve regurgitation. Tricuspid Valve: The tricuspid valve is not well visualized. Tricuspid valve regurgitation is not demonstrated. Aortic Valve: The aortic valve was not well visualized. Aortic valve regurgitation is not visualized. Aortic valve sclerosis/calcification is present, without any evidence of aortic stenosis. Aortic valve peak gradient measures 8.8 mmHg. Pulmonic Valve: The pulmonic valve was not well visualized. Pulmonic valve regurgitation is not visualized. Aorta: The aortic root and ascending aorta are structurally normal,  with no evidence of dilitation. IAS/Shunts: The interatrial septum was not well visualized.  LEFT VENTRICLE PLAX 2D LVIDd:         3.00 cm Diastology LVIDs:         2.10 cm LV e' medial:    5.00 cm/s LV PW:         1.10 cm LV E/e' medial:  11.9 LV IVS:        1.70 cm LV e' lateral:   8.49 cm/s                        LV E/e' lateral: 7.0  RIGHT VENTRICLE RV S prime:     18.60 cm/s TAPSE (M-mode): 2.7 cm LEFT ATRIUM             Index        RIGHT ATRIUM           Index LA diam:        3.60 cm 1.90 cm/m   RA Area:     13.30 cm LA Vol (A2C):   43.8 ml 23.12 ml/m  RA Volume:   26.30 ml   13.88 ml/m LA Vol (A4C):   54.0 ml 28.51 ml/m LA Biplane Vol: 52.1 ml 27.50 ml/m  AORTIC VALVE AV Vmax:      148.00 cm/s AV Peak Grad: 8.8 mmHg LVOT Vmax:    98.07 cm/s LVOT Vmean:   65.167 cm/s LVOT VTI:     0.154 m MITRAL VALVE MV Area (PHT): 2.50 cm    SHUNTS MV Decel Time: 303 msec    Systemic VTI: 0.15 m MV E velocity: 59.70 cm/s MV A velocity: 84.40 cm/s MV E/A ratio:  0.71 Vinie Maxcy MD Electronically signed by Vinie Maxcy MD Signature Date/Time: 06/09/2024/4:12:17 PM    Final    ECHOCARDIOGRAM COMPLETE Result Date: 06/07/2024    ECHOCARDIOGRAM REPORT   Patient Name:   Ronald Lowe Date of Exam: 06/07/2024 Medical Rec #:  [id number]      Height:       72.0 in Accession #:    [id number]     Weight:       151.7 lb Date of Birth:  11/28/1956      BSA:          1.894 m Patient Age:    67 years       BP:           11/11 mmHg Patient Gender: M              HR:           1 bpm. Exam Location:  Inpatient Procedure: 2D Echo (Both Spectral and Color Flow Doppler were utilized during            procedure). Indications:    stroke  History:        Patient has no prior history of Echocardiogram examinations.  Sonographer:    Koleen Popper Referring Phys: [id number] SRISHTI L BHAGAT IMPRESSIONS  1. Echo not perfomred due to patient non compliance.  2. Left ventricular ejection fraction, by estimation, is Not done%. The left ventricle has not done function. Left ventricular endocardial border not optimally defined to evaluate regional wall motion. The left ventricular internal cavity size was not done. Left ventricular diastolic function could not be evaluated.  3. Right ventricular systolic function was not well visualized. The right ventricular size is not well visualized.  4. The mitral valve was  not assessed. No evidence of mitral valve regurgitation.  5. The aortic valve was not assessed. Aortic valve regurgitation is not visualized.  6. Aortic Not imaged. FINDINGS  Left Ventricle: Left ventricular ejection  fraction, by estimation, is Not done%. The left ventricle has not done function. Left ventricular endocardial border not optimally defined to evaluate regional wall motion. Strain was performed and the global longitudinal strain is indeterminate. The left ventricular internal cavity size was not done. Suboptimal image quality limits for assessment of left ventricular hypertrophy. Left ventricular diastolic function could not be evaluated. Right Ventricle: The right ventricular size is not well visualized. Right vetricular wall thickness was not assessed. Right ventricular systolic function was not well visualized. Left Atrium: Left atrial size was not assessed. Right Atrium: Right atrial size was not assessed. Pericardium: The pericardium was not assessed. Mitral Valve: The mitral valve was not assessed. No evidence of mitral valve regurgitation. Tricuspid Valve: The tricuspid valve is not assessed. Tricuspid valve regurgitation is not demonstrated. Aortic Valve: The aortic valve was not assessed. Aortic valve regurgitation is not visualized. Pulmonic Valve: The pulmonic valve was not assessed. Pulmonic valve regurgitation is not visualized. Aorta: Not imaged and the ascending aorta was not well visualized. IAS/Shunts: The interatrial septum was not assessed. Additional Comments: Echo not perfomred due to patient non compliance. 3D was performed not requiring image post processing on an independent workstation and was indeterminate. Maude Emmer MD Electronically signed by Maude Emmer MD Signature Date/Time: 06/07/2024/3:38:19 PM    Final     Assessment/Plan: Diagnosis: L MCA, L PCA scattered infracts Does the need for close, 24 hr/day medical supervision in concert with the patient's rehab needs make it unreasonable for this patient to be served in a less intensive setting? Yes Co-Morbidities requiring supervision/potential complications:  -Carotid stenosis, Afib, HTN, polysubstance abuse, Dysphagia,  Medication noncompliance, HLD, Tobacco use Due to bladder management, bowel management, safety, skin/wound care, disease management, medication administration, pain management, and patient education, does the patient require 24 hr/day rehab nursing? Yes Does the patient require coordinated care of a physician, rehab nurse, therapy disciplines of PT/OT/SLP to address physical and functional deficits in the context of the above medical diagnosis(es)? Yes Addressing deficits in the following areas: balance, endurance, locomotion, strength, transferring, bowel/bladder control, bathing, dressing, feeding, grooming, toileting, cognition, speech, language, swallowing, and psychosocial support Can the patient actively participate in an intensive therapy program of at least 3 hrs of therapy per day at least 5 days per week? Yes The potential for patient to make measurable gains while on inpatient rehab is excellent Anticipated functional outcomes upon discharge from inpatient rehab are supervision and min assist  with PT, supervision and min assist with OT, supervision and min assist with SLP. Estimated rehab length of stay to reach the above functional goals is: 10-14 Anticipated discharge destination: Home Overall Rehab/Functional Prognosis: good  POST ACUTE RECOMMENDATIONS: This patient's condition is appropriate for continued rehabilitative care in the following setting: CIR Patient has agreed to participate in recommended program. Yes Note that insurance prior authorization may be required for reimbursement for recommended care.  Comment: Pt would be candidate for CIR when medically stable.  Currently there is plan for tenative L carotid endarterectomy on Thursday. Would like to confirm disposition plan/family support.    I have personally performed a face to face diagnostic evaluation of this patient. Additionally, I have examined the patient's medical record including any pertinent labs and  radiographic images.    Thanks,  Murray  Urbano, MD 06/11/2024

## 2024-06-12 DIAGNOSIS — I639 Cerebral infarction, unspecified: Secondary | ICD-10-CM | POA: Diagnosis not present

## 2024-06-12 LAB — BASIC METABOLIC PANEL WITH GFR
Anion gap: 13 (ref 5–15)
BUN: 9 mg/dL (ref 8–23)
CO2: 19 mmol/L — ABNORMAL LOW (ref 22–32)
Calcium: 8.9 mg/dL (ref 8.9–10.3)
Chloride: 103 mmol/L (ref 98–111)
Creatinine, Ser: 0.83 mg/dL (ref 0.61–1.24)
GFR, Estimated: >60 mL/min (ref >60)
Glucose, Bld: 111 mg/dL — ABNORMAL HIGH (ref 70–99)
Potassium: 3.9 mmol/L (ref 3.5–5.1)
Sodium: 135 mmol/L (ref 135–145)

## 2024-06-12 MED ORDER — HYDRALAZINE HCL 20 MG/ML IJ SOLN
10.0000 mg | Freq: Four times a day (QID) | INTRAMUSCULAR | Status: DC | PRN
  Administered 2024-06-12 – 2024-06-14 (×2): 10 mg via INTRAVENOUS
  Filled 2024-06-12 (×2): qty 1

## 2024-06-12 MED ORDER — LACTATED RINGERS IV SOLN: INTRAVENOUS | Status: AC

## 2024-06-12 NOTE — Progress Notes (Signed)
 Physical Therapy Treatment Patient Details Name: Ronald Lowe MRN: [id number] DOB: 1956-06-01 Today's Date: 06/12/2024   History of Present Illness Pt is a 68 y.o. male admitted 6/19. He presented to the ED with confusion, R side weakness, slurred speech, and L gaze. MRI revealed multiple acute infarcts in the L MCA and left PCA territories. PMH: daily alcohol use, HTN, a fib not on anticoagulation (secondary to cost per family, per chart review patient reported not liking how the medication made him feel), ongoing smoking, hep C    PT Comments  Pt received in supine and appears more confused with decreased command following this session. Pt requires mod A to sit to EOB and complete standing trials. Pt demonstrates increased posterior lean in standing compared to previous session and is unable to follow cues to correct it. Attempted lateral steps towards HOB, but pt unable to complete despite assist for weight shift and max cues. Pt demonstrates little use of RW support despite assist for hand placement and cues. Pt continues to benefit from PT services to progress toward functional mobility goals.    If plan is discharge home, recommend the following: Two people to help with walking and/or transfers;Two people to help with bathing/dressing/bathroom;Assistance with cooking/housework;Assist for transportation   Can travel by private vehicle        Equipment Recommendations  Other (comment) (TBD)    Recommendations for Other Services       Precautions / Restrictions Precautions Precautions: Fall;Other (comment) Recall of Precautions/Restrictions: Impaired Precaution/Restrictions Comments: posey belt, wrist restraints Restrictions Weight Bearing Restrictions Per Provider Order: No     Mobility  Bed Mobility Overal bed mobility: Needs Assistance Bed Mobility: Supine to Sit, Sit to Supine     Supine to sit: Mod assist Sit to supine: Mod assist, +2 for safety/equipment   General  bed mobility comments: cues for sequencing and mod A for trunk elevation and BLE elevation back to EOB    Transfers Overall transfer level: Needs assistance Equipment used: Rolling walker (2 wheels) Transfers: Sit to/from Stand Sit to Stand: Mod assist, +2 safety/equipment           General transfer comment: From EOB with mod A for power up and anterior weight shift due to posterior lean. Attempted side steps at EOB, but pt unable to weight shift and follow cues    Ambulation/Gait               General Gait Details: unable   Stairs             Wheelchair Mobility     Tilt Bed    Modified Rankin (Stroke Patients Only) Modified Rankin (Stroke Patients Only) Pre-Morbid Rankin Score: No symptoms Modified Rankin: Severe disability     Balance Overall balance assessment: Needs assistance Sitting-balance support: Feet supported Sitting balance-Leahy Scale: Poor Sitting balance - Comments: sitting EOB with CGA   Standing balance support: Bilateral upper extremity supported, During functional activity, Reliant on assistive device for balance Standing balance-Leahy Scale: Poor Standing balance comment: with RW and mod A due to posterior and R lateral lean                            Communication Communication Communication: Impaired Factors Affecting Communication: Difficulty expressing self;Reduced clarity of speech  Cognition Arousal: Alert Behavior During Therapy: WFL for tasks assessed/performed   PT - Cognitive impairments: No family/caregiver present to determine baseline, Difficult to assess, Orientation, Awareness,  Memory, Attention, Problem solving, Safety/Judgement Difficult to assess due to: Impaired communication                       Following commands: Impaired Following commands impaired: Follows one step commands inconsistently, Follows one step commands with increased time    Cueing Cueing Techniques: Verbal cues,  Gestural cues, Tactile cues  Exercises      General Comments        Pertinent Vitals/Pain Pain Assessment Pain Assessment: Faces Faces Pain Scale: Hurts a little bit Pain Location: R shoulder Pain Descriptors / Indicators: Discomfort, Grimacing, Guarding Pain Intervention(s): Monitored during session, Repositioned     PT Goals (current goals can now be found in the care plan section) Acute Rehab PT Goals Patient Stated Goal: unable to state PT Goal Formulation: Patient unable to participate in goal setting Time For Goal Achievement: 06/21/24 Progress towards PT goals: Progressing toward goals    Frequency    Min 3X/week       AM-PAC PT 6 Clicks Mobility   Outcome Measure  Help needed turning from your back to your side while in a flat bed without using bedrails?: A Little Help needed moving from lying on your back to sitting on the side of a flat bed without using bedrails?: A Lot Help needed moving to and from a bed to a chair (including a wheelchair)?: A Lot Help needed standing up from a chair using your arms (e.g., wheelchair or bedside chair)?: A Lot Help needed to walk in hospital room?: Total Help needed climbing 3-5 steps with a railing? : Total 6 Click Score: 11    End of Session Equipment Utilized During Treatment: Gait belt Activity Tolerance: Patient tolerated treatment well Patient left: in bed;with call bell/phone within reach;with restraints reapplied;with bed alarm set Nurse Communication: Mobility status PT Visit Diagnosis: Other abnormalities of gait and mobility (R26.89);Hemiplegia and hemiparesis Hemiplegia - Right/Left: Right Hemiplegia - caused by: Cerebral infarction     Time: 1139-1202 PT Time Calculation (min) (ACUTE ONLY): 23 min  Charges:    $Therapeutic Activity: 23-37 mins PT General Charges $$ ACUTE PT VISIT: 1 Visit                    Darryle George, PTA Acute Rehabilitation Services Secure Chat Preferred  Office:([phone]  [phone]    Darryle George 06/12/2024, 3:05 PM

## 2024-06-12 NOTE — Progress Notes (Signed)
  Progress Note   Patient: Ronald Lowe FMW:[id number] DOB: November 06, 1956 DOA: 06/06/2024     5 DOS: the patient was seen and examined on 06/12/2024 at 11:29AM      Brief hospital course: 68 y.o. M with HTN, polysubstance use, AF not on AC due to nonadherence, smoking, CKD IIIa baseline 1.4, COPD who presented with confusion and right arm weakness.  Found to have acute stroke, left-sided carotid disease.  Vascular surgery consulted.     Assessment and Plan: Acute left MCA multifocal CVA likely due to symptomatic proximal left ICA stenosis - Non-invasive angiography showed occluded proximal left M3 MCA, nearly occlusive stenosis at left carotid bifurcation, right ICA stenosis 55%, moderate V2 vertebral stenosis. - Carotid Doppler showed 60 to 79% stenosis left ICA - Echocardiogram showed no cardiogenic source of embolism - LDL less than 70%, statin deferred but no contraindication - Aspirin  and Plavix  - Evaluation for arrhythmia/atrial fibrillation: none present on monitoring - tPA not given because outside window  - Dysphagia screen ordered in ER - PT eval ordered - Smoking cessation teaching provided    Left ICA stenosis Carotid ultrasound with 60 to 79% stenosis left ICA. - Consult vascular surgery - Continue aspirin  and Plavix  - High intensity statin not started due to LDL less than 66, but no contraindication   Acute metabolic encephalopathy At baseline the patient has no reported cognitive impairment.  Here he is disoriented and delirious.  This seems to be gradually getting better.  Suspected to be a combination of substance withdrawal, delirium.  Hypomagnesemia Hypokalemia Repleted   EtOH use disorder with withdrawal Mild delirium tremens Had worse withdrawal earlier in hospitalization, CIWAs tapered off.  Today is pleasantly confused but no physiologic signs of withdrawal -Continue thiamine , folate   Acute kidney injury on chronic kidney disease: Resolved Resolved      Hypertension BP normal -Hold home lisinopril   Paroxysmal Atrial Fibrillation Noncompliant with outpatient anticoagulation and Coreg .  Rate controlled here.   Hyperlipidemia LDL 66   COPD No evidence of flare - Continue ICS/LABA/LAMA             Subjective: Patient is pleasantly confused.  Still in restraints.  No respiratory symptoms, no fever.     Physical Exam: BP 111/75 (BP Location: Left Arm)   Pulse (!) 102   Temp 98.3 F (36.8 C) (Oral)   Resp 16   Ht 6' (1.829 m)   Wt 68.8 kg   SpO2 97%   BMI 20.57 kg/m   Elderly adult male, lying in bed, mittens and waist belt on.  Seems pleasantly confused. Slightly tachycardic, irregular, no murmurs, no peripheral edema Respiratory rate normal, lungs clear without rales or wheezes Abdomen soft without tenderness palpation, no guarding Is responsive to questions, but rambling and incoherent, he has generalized weakness, right arm weakness worse than left, speech is dysarthric, edentulous    Data Reviewed: Basic metabolic panel unremarkable CBC the other day normal    Family Communication:     Disposition: Status is: Inpatient         Author: Lonni SHAUNNA Dalton, MD 06/12/2024 4:34 PM  For on call review [email or web address].

## 2024-06-12 NOTE — Progress Notes (Addendum)
  Progress Note    06/12/2024 6:48 AM Hospital Day 5  Subjective:  resting comfortably  afebrile  Vitals:   06/12/24 0300 06/12/24 0353  BP: 135/88 (!) 145/100  Pulse:  90  Resp:  18  Temp:  97.8 F (36.6 C)  SpO2:  97%    Physical Exam: General:  no distress; not oriented to year, president or place Lungs:  non labored  CBC    Component Value Date/Time   WBC 6.1 06/10/2024 0456   RBC 3.82 (L) 06/10/2024 0456   HGB 14.2 06/10/2024 0456   HGB 15.8 07/01/2022 1039   HCT 39.2 06/10/2024 0456   HCT 45.1 07/01/2022 1039   PLT 193 06/10/2024 0456   PLT 280 07/01/2022 1039   MCV 102.6 (H) 06/10/2024 0456   MCV 103 (H) 07/01/2022 1039   MCH 37.2 (H) 06/10/2024 0456   MCHC 36.2 (H) 06/10/2024 0456   RDW 14.1 06/10/2024 0456   RDW 13.7 07/01/2022 1039   LYMPHSABS 0.8 06/06/2024 2212   LYMPHSABS 0.8 07/01/2022 1039   MONOABS 0.7 06/06/2024 2212   EOSABS 0.0 06/06/2024 2212   EOSABS 0.2 07/01/2022 1039   BASOSABS 0.1 06/06/2024 2212   BASOSABS 0.1 07/01/2022 1039    BMET    Component Value Date/Time   NA 135 06/12/2024 0441   NA 133 (L) 07/01/2022 1039   K 3.9 06/12/2024 0441   CL 103 06/12/2024 0441   CO2 19 (L) 06/12/2024 0441   GLUCOSE 111 (H) 06/12/2024 0441   BUN 9 06/12/2024 0441   BUN 7 (L) 07/01/2022 1039   CREATININE 0.83 06/12/2024 0441   CALCIUM  8.9 06/12/2024 0441   GFRNONAA >60 06/12/2024 0441   GFRAA 58 (L) 09/08/2020 0910    INR    Component Value Date/Time   INR 1.0 06/06/2024 2212     Intake/Output Summary (Last 24 hours) at 06/12/2024 0648 Last data filed at 06/12/2024 0641 Gross per 24 hour  Intake 772.17 ml  Output 1700 ml  Net -927.83 ml     Assessment/Plan:  68 y.o. male  that vascular surgery was consulted for symptomatic left carotid stenosis.  Has high-grade left ICA stenosis >80% with calcified plaque with left brain infarct on MRI.  Tentative plan is left carotid endarterectomy tomorrow with Dr. [name] for stroke risk  reduction.   Hospital Day 5  -pt more alert than yesterday but is not oriented to place, year or president.  -plan for left CEA tomorrow if he continues to improve.  Will check on pt later today -continue asa/plavix .  Do not see that pt is on statin-would benefit if ok with primary team.     Lucie Apt, PA-C [facility] [phone] 06/12/2024 6:48 AM  I have seen and evaluated the patient. I agree with the PA note as documented above.  Patient is tentatively scheduled for left carotid endarterectomy tomorrow with Dr. [name] for a high-grade symptomatic stenosis.  Remains in restraints and only oriented x 1.  Not sure he will be appropriate for surgery tomorrow but we will leave him on the schedule pending his clinical improvement.  Lonni DOROTHA Gaskins, MD [facility] [place] Office: [phone]

## 2024-06-12 NOTE — Progress Notes (Signed)
 Inpatient Rehab Admissions Coordinator:    CIR following. Await insurance auth.   Leita Kleine, MS, CCC-SLP Rehab Admissions Coordinator  [phone] (celll) [phone] (office)

## 2024-06-12 NOTE — Plan of Care (Signed)
  Problem: Ischemic Stroke/TIA Tissue Perfusion: Goal: Complications of ischemic stroke/TIA will be minimized Outcome: Progressing   Problem: Health Behavior/Discharge Planning: Goal: Ability to manage health-related needs will improve Outcome: Progressing   Problem: Self-Care: Goal: Ability to participate in self-care as condition permits will improve Outcome: Progressing   Problem: Nutrition: Goal: Risk of aspiration will decrease Outcome: Progressing

## 2024-06-12 NOTE — Plan of Care (Signed)
  Problem: Education: Goal: Knowledge of disease or condition will improve Outcome: Not Progressing Goal: Knowledge of secondary prevention will improve (MUST DOCUMENT ALL) Outcome: Not Progressing Goal: Knowledge of patient specific risk factors will improve (DELETE if not current risk factor) Outcome: Not Progressing   Problem: Ischemic Stroke/TIA Tissue Perfusion: Goal: Complications of ischemic stroke/TIA will be minimized Outcome: Progressing   Problem: Coping: Goal: Will verbalize positive feelings about self Outcome: Not Progressing Goal: Will identify appropriate support needs Outcome: Not Progressing   Problem: Health Behavior/Discharge Planning: Goal: Ability to manage health-related needs will improve Outcome: Not Progressing Goal: Goals will be collaboratively established with patient/family Outcome: Not Progressing   Problem: Self-Care: Goal: Ability to participate in self-care as condition permits will improve Outcome: Not Progressing Goal: Verbalization of feelings and concerns over difficulty with self-care will improve Outcome: Not Progressing Goal: Ability to communicate needs accurately will improve Outcome: Not Progressing   Problem: Nutrition: Goal: Risk of aspiration will decrease Outcome: Not Progressing Goal: Dietary intake will improve Outcome: Not Progressing   Problem: Education: Goal: Knowledge of General Education information will improve Description: Including pain rating scale, medication(s)/side effects and non-pharmacologic comfort measures Outcome: Not Progressing   Problem: Health Behavior/Discharge Planning: Goal: Ability to manage health-related needs will improve Outcome: Not Progressing   Problem: Clinical Measurements: Goal: Ability to maintain clinical measurements within normal limits will improve Outcome: Not Progressing Goal: Will remain free from infection Outcome: Not Progressing Goal: Diagnostic test results will  improve Outcome: Not Progressing Goal: Respiratory complications will improve Outcome: Not Progressing Goal: Cardiovascular complication will be avoided Outcome: Not Progressing   Problem: Activity: Goal: Risk for activity intolerance will decrease Outcome: Progressing   Problem: Nutrition: Goal: Adequate nutrition will be maintained Outcome: Not Progressing   Problem: Coping: Goal: Level of anxiety will decrease Outcome: Not Progressing   Problem: Elimination: Goal: Will not experience complications related to bowel motility Outcome: Not Progressing Goal: Will not experience complications related to urinary retention Outcome: Not Progressing   Problem: Pain Managment: Goal: General experience of comfort will improve and/or be controlled Outcome: Progressing   Problem: Safety: Goal: Ability to remain free from injury will improve Outcome: Not Progressing   Problem: Skin Integrity: Goal: Risk for impaired skin integrity will decrease Outcome: Not Progressing   Problem: Safety: Goal: Non-violent Restraint(s) Outcome: Progressing

## 2024-06-13 ENCOUNTER — Encounter (HOSPITAL_COMMUNITY)
Admission: EM | Disposition: A | Discharge status: Other Acute Inpatient Hospital | Payer: Self-pay | Source: Home / Self Care | Attending: Internal Medicine

## 2024-06-13 ENCOUNTER — Encounter (HOSPITAL_COMMUNITY): Payer: Self-pay | Admitting: Certified Registered"

## 2024-06-13 DIAGNOSIS — I639 Cerebral infarction, unspecified: Secondary | ICD-10-CM | POA: Diagnosis not present

## 2024-06-13 LAB — BASIC METABOLIC PANEL WITH GFR
Anion gap: 15 (ref 5–15)
BUN: 15 mg/dL (ref 8–23)
CO2: 17 mmol/L — ABNORMAL LOW (ref 22–32)
Calcium: 8.7 mg/dL — ABNORMAL LOW (ref 8.9–10.3)
Chloride: 106 mmol/L (ref 98–111)
Creatinine, Ser: 0.88 mg/dL (ref 0.61–1.24)
GFR, Estimated: >60 mL/min (ref >60)
Glucose, Bld: 94 mg/dL (ref 70–99)
Potassium: 4.1 mmol/L (ref 3.5–5.1)
Sodium: 138 mmol/L (ref 135–145)

## 2024-06-13 LAB — CBC
HCT: 44.9 % (ref 39.0–52.0)
Hemoglobin: 15.6 g/dL (ref 13.0–17.0)
MCH: 37.6 pg — ABNORMAL HIGH (ref 26.0–34.0)
MCHC: 34.7 g/dL (ref 30.0–36.0)
MCV: 108.2 fL — ABNORMAL HIGH (ref 80.0–100.0)
Platelets: 218 10*3/uL (ref 150–400)
RBC: 4.15 MIL/uL — ABNORMAL LOW (ref 4.22–5.81)
RDW: 13.9 % (ref 11.5–15.5)
WBC: 5.6 10*3/uL (ref 4.0–10.5)
nRBC: 0 % (ref 0.0–0.2)

## 2024-06-13 SURGERY — ENDARTERECTOMY, CAROTID
Anesthesia: General | Laterality: Left

## 2024-06-13 MED ORDER — LISINOPRIL 2.5 MG PO TABS
5.0000 mg | ORAL_TABLET | Freq: Every day | ORAL | Status: DC
  Administered 2024-06-13 – 2024-06-14 (×2): 5 mg via ORAL
  Filled 2024-06-13 (×2): qty 2

## 2024-06-13 MED ORDER — QUETIAPINE FUMARATE 25 MG PO TABS
25.0000 mg | ORAL_TABLET | Freq: Every day | ORAL | Status: DC
  Administered 2024-06-13: 25 mg via ORAL
  Filled 2024-06-13: qty 1

## 2024-06-13 MED ORDER — PANTOPRAZOLE SODIUM 40 MG PO TBEC
40.0000 mg | DELAYED_RELEASE_TABLET | Freq: Every day | ORAL | Status: DC
  Administered 2024-06-14: 40 mg via ORAL
  Filled 2024-06-13: qty 1

## 2024-06-13 NOTE — Progress Notes (Signed)
  Progress Note   Patient: Ronald Lowe FMW:[id number] DOB: 03-Mar-1956 DOA: 06/06/2024     6 DOS: the patient was seen and examined on 06/13/2024 at 9:36AM      Brief hospital course: 68 y.o. M with HTN, polysubstance use, AF not on AC due to nonadherence, smoking, CKD IIIa baseline 1.4, COPD who presented with confusion and right arm weakness.  Found to have acute stroke, left-sided carotid disease.  Vascular surgery consulted.     Assessment and Plan: Acute left MCA multifocal CVA likely due to symptomatic proximal left ICA stenosis Left ICA stenosis See prior summary - Continue aspirin , Plavix  - High intensity statin not started due to LDL less than 66, but no contraindication     Acute metabolic encephalopathy He remains delirious.  This is a combination of stroke, likely alcohol withdrawal. - Continue empiric high-dose thiamine  - Start short term Seroquel at night - PT/OT - Standard delirium precautions, stop tele, avoid Beers list medications      Hypomagnesemia Hypokalemia     EtOH use disorder with withdrawal Mild delirium tremens See above   Acute kidney injury on chronic kidney disease     Hypertension BP >130s - Resume lisinopril    Paroxysmal Atrial Fibrillation No change, not on AC, agree with Neurology, not a good candidate in setting of alcohol use, fall risk and noncompliance   COPD No evidence of flare - Continue ICS/LABA/LAMA                  Subjective: Patient remains confused.  Still in a Posey belt.  No fever, no respiratory distress, no concerns per nursing.     Physical Exam: BP (!) 139/90 (BP Location: Left Arm)   Pulse 94   Temp 98.6 F (37 C) (Oral)   Resp 17   Ht 6' (1.829 m)   Wt 68.8 kg   SpO2 99%   BMI 20.57 kg/m   Thin adult male, lying in bed, interactive prevailing and confused, still in waist belt Heart rate tachycardic, irregular, no murmurs, no peripheral edema Respiratory rate normal, lungs clear, no  rales or wheezes Abdomen soft, no tenderness to palpation, no guarding Responds to questions but incoherent, dysarthric due to edentulism, generalized weakness, right arm weakness worse than left    Data Reviewed: Basic metabolic panel shows bicarb slightly low, otherwise electrolytes and renal function normal CBC normal    Family Communication:     Disposition: Status is: Inpatient         Author: Lonni SHAUNNA Dalton, MD 06/13/2024 12:03 PM  For on call review [email or web address].

## 2024-06-13 NOTE — Progress Notes (Signed)
 Physical Therapy Treatment Patient Details Name: Ronald Lowe MRN: [id number] DOB: 1956/06/27 Today's Date: 06/13/2024   History of Present Illness Pt is a 68 y.o. male admitted 6/19. He presented to the ED with confusion, R side weakness, slurred speech, and L gaze. MRI revealed multiple acute infarcts in the L MCA and left PCA territories. PMH: daily alcohol use, HTN, a fib not on anticoagulation (secondary to cost per family, per chart review patient reported not liking how the medication made him feel), ongoing smoking, hep C    PT Comments  Pt received in supine and agreeable to session. Pt requires increased redirection, cues, and assist throughout session due to impaired cognition. Pt able to tolerate increased gait distance this session with varying min-mod A +2. Pt able to stand at the sink to perform hygiene tasks, however pt demonstrates increased B knee flexion (R>L) with fatigue requiring a seated rest break. Pt demonstrates some decreased awareness on R side and difficulty with coordination and initiation. Pt continues to benefit from PT services to progress toward functional mobility goals.    If plan is discharge home, recommend the following: Two people to help with walking and/or transfers;Two people to help with bathing/dressing/bathroom;Assistance with cooking/housework;Assist for transportation   Can travel by private vehicle        Equipment Recommendations  Other (comment) (TBD)    Recommendations for Other Services Rehab consult     Precautions / Restrictions Precautions Precautions: Fall;Other (comment) Recall of Precautions/Restrictions: Impaired Precaution/Restrictions Comments: posey belt Restrictions Weight Bearing Restrictions Per Provider Order: No     Mobility  Bed Mobility Overal bed mobility: Needs Assistance Bed Mobility: Supine to Sit, Sit to Supine     Supine to sit: Max assist Sit to supine: Max assist   General bed mobility comments:  increased assist due to lack of initiation and command following    Transfers Overall transfer level: Needs assistance Equipment used: 2 person hand held assist Transfers: Sit to/from Stand Sit to Stand: Min assist, +2 safety/equipment           General transfer comment: From EOB and chair with min A for power up and stability    Ambulation/Gait Ambulation/Gait assistance: Mod assist, Min assist, +2 physical assistance, +2 safety/equipment Gait Distance (Feet): 5 Feet (+15) Assistive device: 2 person hand held assist Gait Pattern/deviations: Step-through pattern, Ataxic, Decreased stride length       General Gait Details: Pt demonstrates unsteady gait requiring min-mod A +2 for balance and safety throughout. Increased unsteadiness and assist required during turns and backwards/lateral stepping due to impaired cognition and balance. Pt required assist to advance LLE when lateral stepping due to lack of initiation   Stairs             Wheelchair Mobility     Tilt Bed    Modified Rankin (Stroke Patients Only) Modified Rankin (Stroke Patients Only) Pre-Morbid Rankin Score: No symptoms Modified Rankin: Severe disability     Balance Overall balance assessment: Needs assistance Sitting-balance support: Feet supported Sitting balance-Leahy Scale: Fair Sitting balance - Comments: sitting EOB   Standing balance support: Bilateral upper extremity supported, During functional activity, Reliant on assistive device for balance Standing balance-Leahy Scale: Poor Standing balance comment: with 2 HHA and min-mod A +2                            Communication Communication Communication: Impaired Factors Affecting Communication: Difficulty expressing self;Reduced clarity of  speech  Cognition Arousal: Alert Behavior During Therapy: WFL for tasks assessed/performed   PT - Cognitive impairments: No family/caregiver present to determine baseline, Difficult to  assess, Orientation, Awareness, Memory, Attention, Problem solving, Safety/Judgement Difficult to assess due to: Impaired communication                     PT - Cognition Comments: Pt requires redirection and increased cues througout session due to impaired cognition Following commands: Impaired Following commands impaired: Follows one step commands inconsistently, Follows one step commands with increased time    Cueing Cueing Techniques: Verbal cues, Gestural cues, Tactile cues  Exercises      General Comments        Pertinent Vitals/Pain Pain Assessment Pain Assessment: Faces Faces Pain Scale: No hurt     PT Goals (current goals can now be found in the care plan section) Acute Rehab PT Goals Patient Stated Goal: unable to state PT Goal Formulation: Patient unable to participate in goal setting Time For Goal Achievement: 06/21/24 Progress towards PT goals: Progressing toward goals    Frequency    Min 3X/week           Co-evaluation PT/OT/SLP Co-Evaluation/Treatment: Yes Reason for Co-Treatment: For patient/therapist safety;To address functional/ADL transfers;Necessary to address cognition/behavior during functional activity PT goals addressed during session: Mobility/safety with mobility;Balance;Proper use of DME        AM-PAC PT 6 Clicks Mobility   Outcome Measure  Help needed turning from your back to your side while in a flat bed without using bedrails?: A Lot Help needed moving from lying on your back to sitting on the side of a flat bed without using bedrails?: A Lot Help needed moving to and from a bed to a chair (including a wheelchair)?: A Lot Help needed standing up from a chair using your arms (e.g., wheelchair or bedside chair)?: A Lot Help needed to walk in hospital room?: Total Help needed climbing 3-5 steps with a railing? : Total 6 Click Score: 10    End of Session Equipment Utilized During Treatment: Gait belt Activity Tolerance:  Patient tolerated treatment well Patient left: in bed;with call bell/phone within reach;with restraints reapplied;with bed alarm set Nurse Communication: Mobility status PT Visit Diagnosis: Other abnormalities of gait and mobility (R26.89);Hemiplegia and hemiparesis Hemiplegia - Right/Left: Right Hemiplegia - caused by: Cerebral infarction     Time: 1122-1157 PT Time Calculation (min) (ACUTE ONLY): 35 min  Charges:    $Gait Training: 8-22 mins PT General Charges $$ ACUTE PT VISIT: 1 Visit                     Darryle George, PTA Acute Rehabilitation Services Secure Chat Preferred  [phone]    Darryle George 06/13/2024, 1:07 PM

## 2024-06-13 NOTE — Progress Notes (Signed)
 Speech Language Pathology Treatment: Dysphagia;Cognitive-Linguistic  Patient Details Name: Ronald Lowe MRN: [id number] DOB: Aug 13, 1956 Today's Date: 06/13/2024 Time: 1253-1310 SLP Time Calculation (min) (ACUTE ONLY): 17 min  Assessment / Plan / Recommendation Clinical Impression  Pt remains distractible and is only attentive to POs for short periods of time. He accepted very small pieces of solids and required cueing to attend to mastication. Up to Mod verbal cues were needed to ensure pt achieved oral clearance and used a liquid wash. No s/s of aspiration were observed with thin liquids. Overall, he presents with what is suspected to be cognitively-based dysphagia and is not yet demonstrating readiness to advance diet. Recommend continuing Dys 2 diet with thin liquids.   Pt is disoriented to place, time, and situation but was receptive to Max cueing for carryover of reorientation efforts. His attention is impaired, prohibiting participation in structured tasks. He was unable to store four novel words across multiple attempts. He is ~50% intelligible at the conversation level with limited awareness of listener misunderstanding. Pt describes working PTA but otherwise, unsure of his baseline. Recommend ongoing SLP f/u to target cognition and dysphagia, >3 hrs/day. Will continue following acutely.    HPI HPI: Ronald Lowe is a 68 yo male presenting to ED 6/19 with R sided weakness and dysarthria. MRI shows multiple acute infarcts in the L MCA and L PCA territories, including the L basal ganglia, L posterior limb of the internal capsule, L anterior insula, overlying L frontal and parietal lobes, and L occipital lobe. Failed yale due to lethargy. PMH includes daily ETOH use, HTN, A-fib not on anticoagulation, ongoing tobacco use, hepatitis C      SLP Plan  Continue with current plan of care          Recommendations  Diet recommendations: Dysphagia 2 (fine chop);Thin liquid Liquids provided  via: Cup;Straw Medication Administration: Whole meds with liquid Supervision: Staff to assist with self feeding;Full supervision/cueing for compensatory strategies Compensations: Minimize environmental distractions;Slow rate;Small sips/bites Postural Changes and/or Swallow Maneuvers: Seated upright 90 degrees                  Oral care BID;Staff/trained caregiver to provide oral care   Frequent or constant Supervision/Assistance Dysarthria and anarthria (R47.1);Cognitive communication deficit (R41.841);Dysphagia, unspecified (R13.10)     Continue with current plan of care     Damien Blumenthal, M.A., CCC-SLP Speech Language Pathology, Acute Rehabilitation Services  Secure Chat preferred [phone]   06/13/2024, 1:21 PM

## 2024-06-13 NOTE — Plan of Care (Signed)
  Problem: Education: Goal: Knowledge of disease or condition will improve Outcome: Progressing Goal: Knowledge of secondary prevention will improve (MUST DOCUMENT ALL) Outcome: Progressing Goal: Knowledge of patient specific risk factors will improve (DELETE if not current risk factor) Outcome: Progressing   Problem: Ischemic Stroke/TIA Tissue Perfusion: Goal: Complications of ischemic stroke/TIA will be minimized Outcome: Progressing   Problem: Coping: Goal: Will verbalize positive feelings about self Outcome: Progressing Goal: Will identify appropriate support needs Outcome: Progressing   Problem: Health Behavior/Discharge Planning: Goal: Ability to manage health-related needs will improve Outcome: Progressing Goal: Goals will be collaboratively established with patient/family Outcome: Progressing   Problem: Self-Care: Goal: Ability to participate in self-care as condition permits will improve Outcome: Progressing Goal: Verbalization of feelings and concerns over difficulty with self-care will improve Outcome: Progressing Goal: Ability to communicate needs accurately will improve Outcome: Progressing   Problem: Nutrition: Goal: Risk of aspiration will decrease Outcome: Progressing Goal: Dietary intake will improve Outcome: Progressing   Problem: Education: Goal: Knowledge of General Education information will improve Description: Including pain rating scale, medication(s)/side effects and non-pharmacologic comfort measures Outcome: Progressing   Problem: Health Behavior/Discharge Planning: Goal: Ability to manage health-related needs will improve Outcome: Progressing   Problem: Clinical Measurements: Goal: Ability to maintain clinical measurements within normal limits will improve Outcome: Progressing Goal: Will remain free from infection Outcome: Progressing Goal: Diagnostic test results will improve Outcome: Progressing Goal: Respiratory complications will  improve Outcome: Progressing Goal: Cardiovascular complication will be avoided Outcome: Progressing   Problem: Nutrition: Goal: Adequate nutrition will be maintained Outcome: Progressing   Problem: Activity: Goal: Risk for activity intolerance will decrease Outcome: Progressing   Problem: Coping: Goal: Level of anxiety will decrease Outcome: Progressing   Problem: Nutrition: Goal: Adequate nutrition will be maintained Outcome: Progressing   Problem: Pain Managment: Goal: General experience of comfort will improve and/or be controlled Outcome: Progressing   Problem: Safety: Goal: Ability to remain free from injury will improve Outcome: Progressing

## 2024-06-13 NOTE — Progress Notes (Signed)
 Inpatient Rehab Admissions Coordinator:    CIR following, Insurance remains pending. I spoke with Dr. [name] who feels that if Pt. Is approved for CIR, endarterectomy could wait until CIR is complete and could be done on outpatient basis. I will continue to follow for potential admit pending insurance auth and bed availability.   Leita Kleine, MS, CCC-SLP Rehab Admissions Coordinator  [phone] (celll) [phone] (office)

## 2024-06-13 NOTE — H&P (Incomplete)
 Physical Medicine and Rehabilitation Admission H&P    Chief Complaint  Patient presents with   Code Stroke  : HPI: Ronald Lowe is a 68 year old right-handed male with history significant for hypertension, hepatitis C, hyperlipidemia, paroxysmal atrial fibrillation noncompliant with anticoagulation, GERD, CKD with creatinine baseline 0.94-1.53, COPD followed by pulmonary services, alcohol/tobacco use.  Per chart review patient lives with spouse.  1 level home one-step to entry.  Modified independent prior to admission and reportedly still working.  Presented 06/06/2024 with altered mental status and right side weakness.  Cranial CT scan showed subtle hypoattenuation in the anterior left insula concerning for acute infarct.  Moderate chronic microvascular ischemic disease.  CTA with occluded proximal left M3 MCA.  Critical nearly occlusive stenosis of left carotid bifurcation.  Degree of stenosis was difficult to quantify given the degree of bulky calcific atherosclerosis but likely at 80%.  Approximately 55% stenosis of the right ICA.  Patient did not receive tPA.  MRI showed multiple acute infarcts in the left MCA and left PCA territories, including left basal ganglia, left posterior limb of the internal capsule, left anterior insula and overlying left frontal and parietal lobes in the left occipital lobe.  No mass effect.  Admission chemistries positive cocaine benzos and amphetamines, alcohol negative, creatinine 1.53, total bilirubin 2.0.  Echocardiogram with ejection fraction of 60 to 65% no wall motion abnormalities grade 1 diastolic dysfunction.  Vascular surgery Dr. [name] for symptomatic left carotid stenosis and at this time wished to hold on any surgical intervention until mental status improved and this could be addressed as an outpatient.  He was cleared to begin aspirin  and Plavix  for CVA prophylaxis.  Subcutaneous Lovenox  for DVT prophylaxis.  Currently on dysphagia #2 thin liquid diet.   Therapy evaluations completed due to patient decreased functional mobility was admitted for a comprehensive rehab program  Review of Systems  Constitutional:  Negative for chills and fever.  HENT:  Negative for hearing loss.   Eyes:  Negative for blurred vision and double vision.  Respiratory:  Negative for cough and wheezing.        Shortness of breath with heavy exertion  Cardiovascular:  Positive for palpitations.  Gastrointestinal:  Positive for constipation. Negative for heartburn, nausea and vomiting.       GERD  Genitourinary:  Positive for urgency. Negative for dysuria, flank pain and hematuria.  Musculoskeletal:  Positive for myalgias.  Skin:  Negative for rash.  Neurological:  Positive for dizziness, weakness and headaches.  All other systems reviewed and are negative.  Past Medical History:  Diagnosis Date   Aortic atherosclerosis (HCC)    Cervical spinal stenosis    Chronic kidney disease    COPD (chronic obstructive pulmonary disease) (HCC)    Coronary artery calcification seen on CT scan    ETOH abuse    a.) 28+ standard drinks/week   GERD (gastroesophageal reflux disease)    History of amiodarone  therapy    a.) in the past; self discontinued   History of medication noncompliance    a.) reported 10/2022 that he has been off NOAC for several months secondary to vertiginous symptoms, not acting like himself, and depression; b.) off/not taking as prescribed: antiarrhythmics, antihypertensives, statins, and MDIs   Hyperlipidemia    Hypertension    Long term current use of anticoagulant    a.) rivaroxaban    PAF (paroxysmal atrial fibrillation) (HCC)    a.) CHA2DS2VASc = 3 (age, HTN, vascular disease history);  b.) rate/rhythm maintained  on oral carvediolol (self discontinued diltiazem  + amiodarone  + metoprolol ); chronically anticoagulated with rivaroxaban  (switched from apixaban  11/17/2022)   Tobacco abuse    Past Surgical History:  Procedure Laterality Date    SHOULDER ARTHROSCOPY WITH SUBACROMIAL DECOMPRESSION, ROTATOR CUFF REPAIR AND BICEP TENDON REPAIR Left 12/07/2022   Procedure: SHOULDER ARTHROSCOPY WITH DEBRIDEMENT, DECOMPRESSION, ROTATOR CUFF REPAIR AND BICEPS TENODESIS.- RNFA;  Surgeon: Edie Norleen PARAS, MD;  Location: [facility] ORS;  Service: Orthopedics;  Laterality: Left;   SHOULDER ARTHROSCOPY WITH SUBACROMIAL DECOMPRESSION, ROTATOR CUFF REPAIR AND BICEP TENDON REPAIR Left 02/02/2023   Procedure: LEFT SHOULDER ARTHROSCOPY WITH DEBRIDEMENT AND REPAIR OF RECURRENT ROTATOR CUFF TEAR OF LEFT SHOULDER;  Surgeon: Edie Norleen PARAS, MD;  Location: [facility];  Service: Orthopedics;  Laterality: Left;   TONSILLECTOMY     Family History  Problem Relation Age of Onset   Emphysema Father    CAD Father        CABG   Stroke Mother    CAD Brother 23       CABG   Hypertension Brother    Social History:  reports that he has been smoking cigarettes. He started smoking about 53 years ago. He has a 13.4 pack-year smoking history. He has quit using smokeless tobacco. He reports current alcohol use of about 28.0 standard drinks of alcohol per week. He reports current drug use. Drug: Marijuana. Allergies: No Known Allergies Medications Prior to Admission  Medication Sig Dispense Refill   albuterol  (VENTOLIN  HFA) 108 (90 Base) MCG/ACT inhaler INHALE 2 PUFFS BY MOUTH EVERY 4 HOURS AS NEEDED FOR WHEEZE OR FOR SHORTNESS OF BREATH 18 each 5   famotidine  (PEPCID ) 40 MG tablet TAKE 1 TABLET EVERY DAY 90 tablet 3   hydrOXYzine  (VISTARIL ) 25 MG capsule TAKE 1 CAPSULE (25 MG TOTAL) BY MOUTH EVERY 6 (SIX) HOURS AS NEEDED. 360 capsule 1   TRELEGY ELLIPTA  100-62.5-25 MCG/ACT AEPB INHALE 1 PUFF EVERY DAY 180 each 0   lisinopril  (ZESTRIL ) 5 MG tablet TAKE 1 TABLET EVERY DAY (Patient not taking: Reported on 06/07/2024) 90 tablet 3      Home: Home Living Family/patient expects to be discharged to:: Private residence Living Arrangements: Spouse/significant other Available Help at  Discharge: Family, Available 24 hours/day Type of Home: House Home Access: Stairs to enter [employer] of Steps: 2 Entrance Stairs-Rails: Right, Left Home Layout: One level Bathroom Shower/Tub: Engineer, manufacturing systems: Standard Bathroom Accessibility: Yes Home Equipment: None Additional Comments: Pt unable to provide hx. Most info obtained from previous admission (2022).  Lives With: Spouse   Functional History: Prior Function Prior Level of Function : Independent/Modified Independent, Patient poor historian/Family not available, Working/employed Mobility Comments: 3 falls  Functional Status:  Mobility: Bed Mobility Overal bed mobility: Needs Assistance Bed Mobility: Supine to Sit, Sit to Supine Supine to sit: Max assist Sit to supine: Max assist General bed mobility comments: increased assist due to lack of initiation and command following Transfers Overall transfer level: Needs assistance Equipment used: 2 person hand held assist Transfers: Sit to/from Stand Sit to Stand: Min assist, +2 safety/equipment General transfer comment: From EOB and chair with arm rests with min A for power up and stability Ambulation/Gait Ambulation/Gait assistance: Mod assist, Min assist, +2 physical assistance, +2 safety/equipment Gait Distance (Feet): 5 Feet (+15) Assistive device: 2 person hand held assist Gait Pattern/deviations: Step-through pattern, Ataxic, Decreased stride length General Gait Details: Pt demonstrates unsteady gait requiring min-mod A +2 for balance and safety throughout. Increased unsteadiness and assist required  during turns and backwards/lateral stepping due to impaired cognition and balance. Pt required assist to advance LLE when lateral stepping due to lack of initiation    ADL: ADL Overall ADL's : Needs assistance/impaired Eating/Feeding: NPO Grooming: Moderate assistance Grooming Details (indicate cue type and reason): unable to bring hand all  the way to his face Upper Body Bathing: Cueing for safety Lower Body Bathing: Total assistance, +2 for physical assistance, +2 for safety/equipment Upper Body Dressing : Maximal assistance Lower Body Dressing: Maximal assistance, Bed level Lower Body Dressing Details (indicate cue type and reason): to don socks, did not lift RLE from bed to assist Toilet Transfer: +2 for physical assistance, +2 for safety/equipment, Minimal assistance, Ambulation, Cueing for sequencing Toilet Transfer Details (indicate cue type and reason): 2 person HHA Toileting- Clothing Manipulation and Hygiene: Total assistance, +2 for physical assistance, +2 for safety/equipment Functional mobility during ADLs: +2 for physical assistance, +2 for safety/equipment, Minimal assistance, Moderate assistance, Cueing for safety, Cueing for sequencing (2 person HHA) General ADL Comments: limited by impaired cognition, midline awareness and weakness.  Cognition: Cognition Overall Cognitive Status: No family/caregiver present to determine baseline cognitive functioning Arousal/Alertness: Awake/alert Orientation Level: Oriented to person, Oriented to situation, Disoriented to place, Disoriented to time Attention: Sustained Sustained Attention: Impaired Sustained Attention Impairment: Verbal basic, Functional basic Memory: Impaired Memory Impairment: Decreased recall of new information Awareness: Impaired Awareness Impairment: Intellectual impairment Problem Solving: Impaired Problem Solving Impairment: Functional basic Cognition Arousal: Alert Behavior During Therapy: WFL for tasks assessed/performed Overall Cognitive Status: No family/caregiver present to determine baseline cognitive functioning  Physical Exam: Blood pressure (!) 169/115, pulse 77, temperature 98.4 F (36.9 C), resp. rate 18, height 6' (1.829 m), weight 68.8 kg, SpO2 100%. Physical Exam  Neurological:     Comments: Patient is alert and makes eye  contact with examiner.  Speech is dysarthric.  He was able to answer some basic questions with some delay in processing and very limited medical historian.  He was able to name 2/4 and able to repeat with dysarthric speech.    Results for orders placed or performed during the hospital encounter of 06/06/24 (from the past 48 hours)  Basic metabolic panel with GFR     Status: Abnormal   Collection Time: 06/13/24  4:23 AM  Result Value Ref Range   Sodium 138 135 - 145 mmol/L   Potassium 4.1 3.5 - 5.1 mmol/L   Chloride 106 98 - 111 mmol/L   CO2 17 (L) 22 - 32 mmol/L   Glucose, Bld 94 70 - 99 mg/dL    Comment: Glucose reference range applies only to samples taken after fasting for at least 8 hours.   BUN 15 8 - 23 mg/dL   Creatinine, Ser 9.11 0.61 - 1.24 mg/dL   Calcium  8.7 (L) 8.9 - 10.3 mg/dL   GFR, Estimated >39 >39 mL/min    Comment: (NOTE) Calculated using the CKD-EPI Creatinine Equation (2021)    Anion gap 15 5 - 15    Comment: Performed at [facility], [address], [place], [place] [address]  CBC     Status: Abnormal   Collection Time: 06/13/24  4:23 AM  Result Value Ref Range   WBC 5.6 4.0 - 10.5 K/uL   RBC 4.15 (L) 4.22 - 5.81 MIL/uL   Hemoglobin 15.6 13.0 - 17.0 g/dL   HCT 55.0 60.9 - 47.9 %   MCV 108.2 (H) 80.0 - 100.0 fL   MCH 37.6 (H) 26.0 - 34.0  pg   MCHC 34.7 30.0 - 36.0 g/dL   RDW 86.0 88.4 - 84.4 %   Platelets 218 150 - 400 K/uL   nRBC 0.0 0.0 - 0.2 %    Comment: Performed at [facility], [address], [place], [place] [address]   No results found.    Blood pressure (!) 169/115, pulse 77, temperature 98.4 F (36.9 C), resp. rate 18, height 6' (1.829 m), weight 68.8 kg, SpO2 100%.  Medical Problem List and Plan: 1. Functional deficits secondary to left MCA scattered infarct likely etiology due to symptomatic proximal ICA stenosis  -patient may *** shower  -ELOS/Goals: *** 2.  Antithrombotics: -DVT/anticoagulation:  Pharmaceutical:  Lovenox   -antiplatelet therapy: Aspirin  81 mg daily and Plavix  75 mg daily until after left carotid surgery  3. Pain Management: Tylenol  as needed 4. Mood/Behavior/Sleep: Provide emotional support  -antipsychotic agents: N/A 5. Neuropsych/cognition: This patient is not capable of making decisions on his own behalf. 6. Skin/Wound Care: Routine skin checks 7. Fluids/Electrolytes/Nutrition: Routine in and outs with follow-up chemistries 8.  Dysphagia.  Dysphagia #2 thin liquids.  Follow-up speech therapy 9.  COPD/tobacco use.  Continue inhalers.  NicoDerm patch.  Provide counseling 10.  PAF.  Noncompliant anticoagulation.  Cardiac rate controlled 11.  History of alcohol use.  Monitor for any signs of withdrawal.  Provide counseling 12.  AKI/CKD.  Follow-up chemistries 13.  GERD.  Protonix  14.  Hypertension.  Patient on lisinopril  5 mg day prior to admission but noncompliant.  Monitor with increased mobility 15.  Symptomatic left ICA stenosis.  Follow-up outpatient vascular surgery Dr. [name] Toribio JINNY Pegge, PA-C 06/14/2024

## 2024-06-13 NOTE — Progress Notes (Signed)
 [facility] PROGRESS NOTE  ASSESSMENT / PLAN: Ronald Lowe is a 68 y.o. male with symptomatic left carotid stenosis. Delirium continues, and patient is only oriented to self. Will plan to delay endarterectomy until his mental status has cleared.   SUBJECTIVE: Confused. Thinks he's in [place]. Does not know the year. Does seem to know his name.   OBJECTIVE: BP 100/76 (BP Location: Right Leg)   Pulse 89   Temp 97.8 F (36.6 C) (Oral)   Resp 18   Ht 6' (1.829 m)   Wt 68.8 kg   SpO2 100%   BMI 20.57 kg/m   Intake/Output Summary (Last 24 hours) at 06/13/2024 0734 Last data filed at 06/12/2024 1800 Gross per 24 hour  Intake 698.44 ml  Output 200 ml  Net 498.44 ml    No distress Thin, disheveled man in no distress AXO x 1 No CN abnormalities. Moves all extremities.     Latest Ref Rng & Units 06/13/2024    4:23 AM 06/10/2024    4:56 AM 06/08/2024    6:53 AM  CBC  WBC 4.0 - 10.5 K/uL 5.6  6.1  9.0   Hemoglobin 13.0 - 17.0 g/dL 84.3  85.7  83.1   Hematocrit 39.0 - 52.0 % 44.9  39.2  46.4   Platelets 150 - 400 K/uL 218  193  178         Latest Ref Rng & Units 06/13/2024    4:23 AM 06/12/2024    4:41 AM 06/11/2024    4:45 AM  CMP  Glucose 70 - 99 mg/dL 94  888  96   BUN 8 - 23 mg/dL 15  9  7    Creatinine 0.61 - 1.24 mg/dL 9.11  9.16  9.13   Sodium 135 - 145 mmol/L 138  135  135   Potassium 3.5 - 5.1 mmol/L 4.1  3.9  3.6   Chloride 98 - 111 mmol/L 106  103  105   CO2 22 - 32 mmol/L 17  19  20    Calcium  8.9 - 10.3 mg/dL 8.7  8.9  8.8     Estimated Creatinine Clearance: 77.1 mL/min (by C-G formula based on SCr of 0.88 mg/dL).  [name]. [name], MD [facility] Phone Number: [phone] 06/13/2024 7:34 AM

## 2024-06-13 NOTE — Progress Notes (Signed)
 Occupational Therapy Treatment Patient Details Name: Ronald Lowe MRN: [id number] DOB: 1956-09-03 Today's Date: 06/13/2024   History of present illness Pt is a 68 y.o. male admitted 6/19. He presented to the ED with confusion, R side weakness, slurred speech, and L gaze. MRI revealed multiple acute infarcts in the L MCA and left PCA territories. PMH: daily alcohol use, HTN, a fib not on anticoagulation (secondary to cost per family, per chart review patient reported not liking how the medication made him feel), ongoing smoking, hep C   OT comments  Pt is progressing towards OT goals this session. Remains hard to understand with impaired cognition and potentially impaired vision?!?! His ability to verbally identify objects was very inconsistent, decreased task initiation for all movement and functional grooming. Pt max A for bed mobility, min A +2 to mod A +2 for in room mobility with 2 person HHA.Pt was able to stand at sink for approx 7 min before requiring seated rest break with no awareness of RLE slowly buckling. Please see cognition and vision sections below for further assessment in those areas. POC remains appropriate at this time and OT will continue to follow acutely.       If plan is discharge home, recommend the following:  A lot of help with walking and/or transfers;Two people to help with walking and/or transfers;A lot of help with bathing/dressing/bathroom;Two people to help with bathing/dressing/bathroom;Assistance with cooking/housework;Assistance with feeding;Direct supervision/assist for medications management;Direct supervision/assist for financial management;Assist for transportation;Help with stairs or ramp for entrance;Supervision due to cognitive status   Equipment Recommendations  BSC/3in1;Other (comment)    Recommendations for Other Services Rehab consult    Precautions / Restrictions Precautions Precautions: Fall;Other (comment) Recall of Precautions/Restrictions:  Impaired Precaution/Restrictions Comments: posey belt Restrictions Weight Bearing Restrictions Per Provider Order: No       Mobility Bed Mobility Overal bed mobility: Needs Assistance Bed Mobility: Supine to Sit, Sit to Supine     Supine to sit: Max assist Sit to supine: Max assist   General bed mobility comments: increased assist due to lack of initiation and command following    Transfers Overall transfer level: Needs assistance Equipment used: 2 person hand held assist Transfers: Sit to/from Stand Sit to Stand: Min assist, +2 safety/equipment           General transfer comment: From EOB and chair with arm rests with min A for power up and stability     Balance Overall balance assessment: Needs assistance Sitting-balance support: Feet supported Sitting balance-Leahy Scale: Fair Sitting balance - Comments: sitting EOB Postural control: Right lateral lean, Posterior lean Standing balance support: Bilateral upper extremity supported, During functional activity, Reliant on assistive device for balance Standing balance-Leahy Scale: Poor Standing balance comment: with 2 HHA and min-mod A +2                           ADL either performed or assessed with clinical judgement   ADL Overall ADL's : Needs assistance/impaired     Grooming: Moderate assistance Grooming Details (indicate cue type and reason): unable to bring hand all the way to his face Upper Body Bathing: Cueing for safety           Lower Body Dressing: Maximal assistance;Bed level Lower Body Dressing Details (indicate cue type and reason): to don socks, did not lift RLE from bed to assist Toilet Transfer: +2 for physical assistance;+2 for safety/equipment;Minimal assistance;Ambulation;Cueing for sequencing Toilet Transfer Details (indicate cue  type and reason): 2 person HHA         Functional mobility during ADLs: +2 for physical assistance;+2 for safety/equipment;Minimal  assistance;Moderate assistance;Cueing for safety;Cueing for sequencing (2 person HHA) General ADL Comments: limited by impaired cognition, midline awareness and weakness.    Extremity/Trunk Assessment Upper Extremity Assessment Upper Extremity Assessment: RUE deficits/detail RUE Deficits / Details: Moving against gravity. He was unable to bring his hand to his nose adn required hand over hand to place hand on walker however he was able to sustain his grasp on the walker RUE Coordination: decreased gross motor;decreased fine motor LUE Deficits / Details: per wife pt has chronic L shoulder limitations. he used LUE functionall during bed mobility, transfers and balance LUE Coordination: decreased fine motor;decreased gross motor   Lower Extremity Assessment RLE Deficits / Details: AROM on command (SLR and ankle pump), moving in synergistic pattern, unable to follow commands for MMT        Vision   Vision Assessment?: Vision impaired- to be further tested in functional context Additional Comments: reports double vision, but does not go away with one eye covered. but unable to hit targets with finger undershooting and to the right typically.   Perception     Praxis     Communication Communication Communication: Impaired Factors Affecting Communication: Difficulty expressing self;Reduced clarity of speech   Cognition Arousal: Alert Behavior During Therapy: WFL for tasks assessed/performed Cognition: Cognition impaired   Orientation impairments: Time, Situation, Place (heaven and the four seasons) Awareness: Intellectual awareness impaired, Online awareness impaired Memory impairment (select all impairments): Short-term memory, Working memory Attention impairment (select first level of impairment): Sustained attention Executive functioning impairment (select all impairments): Initiation, Organization, Reasoning, Problem solving, Sequencing OT - Cognition Comments: Pt with varied  ability to identify items and answer orientation questions. Thought it was August, knew Trump was president, identified phone and some colors - but when asked to say his ABC's he answered 1,2,3. When asked to count to 10 he said 2, 4, 6, 8, 10. Pt sometimes unintelligble, and suspect that he uses humor to deflect and hide difficulties at times. Very complex to break down cognition at this time.                 Following commands: Impaired Following commands impaired: Follows one step commands inconsistently, Follows one step commands with increased time      Cueing   Cueing Techniques: Verbal cues, Gestural cues, Tactile cues  Exercises      Shoulder Instructions       General Comments      Pertinent Vitals/ Pain       Pain Assessment Pain Assessment: Faces Faces Pain Scale: No hurt Pain Intervention(s): Monitored during session, Repositioned  Home Living                                          Prior Functioning/Environment              Frequency  Min 2X/week        Progress Toward Goals  OT Goals(current goals can now be found in the care plan section)  Progress towards OT goals: Progressing toward goals  Acute Rehab OT Goals Patient Stated Goal: get better OT Goal Formulation: With patient Time For Goal Achievement: 06/21/24 Potential to Achieve Goals: Good ADL Goals Pt Will Perform Grooming: with set-up Pt Will Perform  Upper Body Dressing: with set-up Pt Will Perform Lower Body Dressing: with mod assist;sit to/from stand Pt Will Transfer to Toilet: with mod assist;bedside commode;stand pivot transfer Additional ADL Goal #1: Pt will follow simple 1 step commands 75% of the time during ADLs  Plan      Co-evaluation    PT/OT/SLP Co-Evaluation/Treatment: Yes Reason for Co-Treatment: For patient/therapist safety;To address functional/ADL transfers;Necessary to address cognition/behavior during functional activity PT goals addressed  during session: Mobility/safety with mobility;Balance;Proper use of DME OT goals addressed during session: ADL's and self-care      AM-PAC OT 6 Clicks Daily Activity     Outcome Measure   Help from another person eating meals?: A Little Help from another person taking care of personal grooming?: A Lot Help from another person toileting, which includes using toliet, bedpan, or urinal?: Total Help from another person bathing (including washing, rinsing, drying)?: A Lot Help from another person to put on and taking off regular upper body clothing?: A Lot Help from another person to put on and taking off regular lower body clothing?: A Lot 6 Click Score: 12    End of Session Equipment Utilized During Treatment: Gait belt  OT Visit Diagnosis: Unsteadiness on feet (R26.81);Other abnormalities of gait and mobility (R26.89);Muscle weakness (generalized) (M62.81);Hemiplegia and hemiparesis Hemiplegia - Right/Left: Right Hemiplegia - dominant/non-dominant: Dominant Hemiplegia - caused by: Cerebral infarction   Activity Tolerance Patient tolerated treatment well   Patient Left in bed;with call bell/phone within reach;with bed alarm set;with family/visitor present;with restraints reapplied   Nurse Communication Mobility status        Time: 8877-8842 OT Time Calculation (min): 35 min  Charges: OT General Charges $OT Visit: 1 Visit OT Treatments $Self Care/Home Management : 8-22 mins  Ronald Lowe OTR/L Acute Rehabilitation Services Office: [phone]  Ronald Lowe [facility] 06/13/2024, 1:59 PM

## 2024-06-14 ENCOUNTER — Encounter (HOSPITAL_COMMUNITY): Payer: Self-pay | Admitting: Physical Medicine & Rehabilitation

## 2024-06-14 ENCOUNTER — Other Ambulatory Visit: Payer: Self-pay

## 2024-06-14 ENCOUNTER — Inpatient Hospital Stay (HOSPITAL_COMMUNITY)
Admission: AD | Admit: 2024-06-14 | Discharge: 2024-06-25 | DRG: 057 | Disposition: A | Discharge status: Home / Self Care | Source: Intra-hospital | Attending: Physical Medicine & Rehabilitation | Admitting: Physical Medicine & Rehabilitation

## 2024-06-14 DIAGNOSIS — I700 Atherosclerosis of aorta: Secondary | ICD-10-CM | POA: Diagnosis present

## 2024-06-14 DIAGNOSIS — Z8619 Personal history of other infectious and parasitic diseases: Secondary | ICD-10-CM

## 2024-06-14 DIAGNOSIS — I63512 Cerebral infarction due to unspecified occlusion or stenosis of left middle cerebral artery: Secondary | ICD-10-CM | POA: Diagnosis not present

## 2024-06-14 DIAGNOSIS — I69320 Aphasia following cerebral infarction: Secondary | ICD-10-CM

## 2024-06-14 DIAGNOSIS — M4802 Spinal stenosis, cervical region: Secondary | ICD-10-CM | POA: Diagnosis present

## 2024-06-14 DIAGNOSIS — K219 Gastro-esophageal reflux disease without esophagitis: Secondary | ICD-10-CM | POA: Diagnosis present

## 2024-06-14 DIAGNOSIS — Z7902 Long term (current) use of antithrombotics/antiplatelets: Secondary | ICD-10-CM

## 2024-06-14 DIAGNOSIS — I69351 Hemiplegia and hemiparesis following cerebral infarction affecting right dominant side: Principal | ICD-10-CM

## 2024-06-14 DIAGNOSIS — F1010 Alcohol abuse, uncomplicated: Secondary | ICD-10-CM | POA: Diagnosis present

## 2024-06-14 DIAGNOSIS — N1831 Chronic kidney disease, stage 3a: Secondary | ICD-10-CM | POA: Diagnosis not present

## 2024-06-14 DIAGNOSIS — I69391 Dysphagia following cerebral infarction: Secondary | ICD-10-CM | POA: Diagnosis not present

## 2024-06-14 DIAGNOSIS — Z823 Family history of stroke: Secondary | ICD-10-CM | POA: Diagnosis not present

## 2024-06-14 DIAGNOSIS — Z8249 Family history of ischemic heart disease and other diseases of the circulatory system: Secondary | ICD-10-CM | POA: Diagnosis not present

## 2024-06-14 DIAGNOSIS — Z79899 Other long term (current) drug therapy: Secondary | ICD-10-CM

## 2024-06-14 DIAGNOSIS — R4701 Aphasia: Secondary | ICD-10-CM | POA: Diagnosis not present

## 2024-06-14 DIAGNOSIS — Z91148 Patient's other noncompliance with medication regimen for other reason: Secondary | ICD-10-CM | POA: Diagnosis not present

## 2024-06-14 DIAGNOSIS — F17210 Nicotine dependence, cigarettes, uncomplicated: Secondary | ICD-10-CM | POA: Diagnosis present

## 2024-06-14 DIAGNOSIS — R32 Unspecified urinary incontinence: Secondary | ICD-10-CM | POA: Diagnosis not present

## 2024-06-14 DIAGNOSIS — I639 Cerebral infarction, unspecified: Principal | ICD-10-CM

## 2024-06-14 DIAGNOSIS — R414 Neurologic neglect syndrome: Secondary | ICD-10-CM | POA: Diagnosis not present

## 2024-06-14 DIAGNOSIS — E785 Hyperlipidemia, unspecified: Secondary | ICD-10-CM | POA: Diagnosis present

## 2024-06-14 DIAGNOSIS — I480 Paroxysmal atrial fibrillation: Secondary | ICD-10-CM | POA: Diagnosis present

## 2024-06-14 DIAGNOSIS — J449 Chronic obstructive pulmonary disease, unspecified: Secondary | ICD-10-CM | POA: Diagnosis present

## 2024-06-14 DIAGNOSIS — I69322 Dysarthria following cerebral infarction: Secondary | ICD-10-CM | POA: Diagnosis not present

## 2024-06-14 DIAGNOSIS — Z7982 Long term (current) use of aspirin: Secondary | ICD-10-CM

## 2024-06-14 DIAGNOSIS — Z825 Family history of asthma and other chronic lower respiratory diseases: Secondary | ICD-10-CM | POA: Diagnosis not present

## 2024-06-14 DIAGNOSIS — Z7901 Long term (current) use of anticoagulants: Secondary | ICD-10-CM

## 2024-06-14 DIAGNOSIS — I129 Hypertensive chronic kidney disease with stage 1 through stage 4 chronic kidney disease, or unspecified chronic kidney disease: Secondary | ICD-10-CM | POA: Diagnosis present

## 2024-06-14 DIAGNOSIS — N179 Acute kidney failure, unspecified: Secondary | ICD-10-CM | POA: Diagnosis present

## 2024-06-14 DIAGNOSIS — Z7951 Long term (current) use of inhaled steroids: Secondary | ICD-10-CM

## 2024-06-14 DIAGNOSIS — I2510 Atherosclerotic heart disease of native coronary artery without angina pectoris: Secondary | ICD-10-CM | POA: Diagnosis present

## 2024-06-14 DIAGNOSIS — R451 Restlessness and agitation: Secondary | ICD-10-CM | POA: Diagnosis not present

## 2024-06-14 DIAGNOSIS — R410 Disorientation, unspecified: Secondary | ICD-10-CM | POA: Diagnosis not present

## 2024-06-14 DIAGNOSIS — I6522 Occlusion and stenosis of left carotid artery: Secondary | ICD-10-CM | POA: Diagnosis not present

## 2024-06-14 DIAGNOSIS — I69393 Ataxia following cerebral infarction: Secondary | ICD-10-CM

## 2024-06-14 DIAGNOSIS — I6523 Occlusion and stenosis of bilateral carotid arteries: Secondary | ICD-10-CM | POA: Diagnosis present

## 2024-06-14 DIAGNOSIS — K5901 Slow transit constipation: Secondary | ICD-10-CM | POA: Diagnosis not present

## 2024-06-14 DIAGNOSIS — F1290 Cannabis use, unspecified, uncomplicated: Secondary | ICD-10-CM | POA: Diagnosis present

## 2024-06-14 DIAGNOSIS — J439 Emphysema, unspecified: Secondary | ICD-10-CM

## 2024-06-14 DIAGNOSIS — I10 Essential (primary) hypertension: Secondary | ICD-10-CM | POA: Diagnosis not present

## 2024-06-14 MED ORDER — ASPIRIN 325 MG PO TABS: 325.0000 mg | ORAL_TABLET | Freq: Every day | ORAL | Status: DC

## 2024-06-14 MED ORDER — FOLIC ACID 1 MG PO TABS
1.0000 mg | ORAL_TABLET | Freq: Every day | ORAL | Status: DC
  Administered 2024-06-15 – 2024-06-25 (×11): 1 mg via ORAL
  Filled 2024-06-14 (×11): qty 1

## 2024-06-14 MED ORDER — ADULT MULTIVITAMIN W/MINERALS CH
1.0000 | ORAL_TABLET | Freq: Every day | ORAL | Status: DC
  Administered 2024-06-15 – 2024-06-25 (×11): 1 via ORAL
  Filled 2024-06-14 (×11): qty 1

## 2024-06-14 MED ORDER — VITAMIN B-12 1000 MCG PO TABS
1000.0000 ug | ORAL_TABLET | Freq: Every day | ORAL | Status: DC
Start: 2024-06-15 — End: 2024-06-25
  Administered 2024-06-15 – 2024-06-25 (×11): 1000 ug via ORAL
  Filled 2024-06-14 (×11): qty 1

## 2024-06-14 MED ORDER — QUETIAPINE FUMARATE 25 MG PO TABS: 25.0000 mg | ORAL_TABLET | Freq: Every day | ORAL | Status: DC

## 2024-06-14 MED ORDER — ENOXAPARIN SODIUM 40 MG/0.4ML IJ SOSY: 40.0000 mg | PREFILLED_SYRINGE | INTRAMUSCULAR | Status: DC

## 2024-06-14 MED ORDER — NICOTINE 14 MG/24HR TD PT24: 14.0000 mg | MEDICATED_PATCH | Freq: Every day | TRANSDERMAL | Status: DC

## 2024-06-14 MED ORDER — ARFORMOTEROL TARTRATE 15 MCG/2ML IN NEBU
15.0000 ug | INHALATION_SOLUTION | Freq: Two times a day (BID) | RESPIRATORY_TRACT | Status: DC
  Administered 2024-06-14 – 2024-06-25 (×18): 15 ug via RESPIRATORY_TRACT
  Filled 2024-06-14 (×19): qty 2

## 2024-06-14 MED ORDER — BUDESONIDE 0.5 MG/2ML IN SUSP
0.5000 mg | Freq: Two times a day (BID) | RESPIRATORY_TRACT | Status: DC
  Administered 2024-06-14 – 2024-06-25 (×19): 0.5 mg via RESPIRATORY_TRACT
  Filled 2024-06-14 (×22): qty 2

## 2024-06-14 MED ORDER — THIAMINE HCL 100 MG/ML IJ SOLN
100.0000 mg | Freq: Every day | INTRAMUSCULAR | Status: DC
  Administered 2024-06-15: 100 mg via INTRAVENOUS
  Filled 2024-06-14: qty 1

## 2024-06-14 MED ORDER — CLOPIDOGREL BISULFATE 75 MG PO TABS: 75.0000 mg | ORAL_TABLET | Freq: Every day | ORAL | Status: DC

## 2024-06-14 MED ORDER — ASPIRIN 81 MG PO TBEC
81.0000 mg | DELAYED_RELEASE_TABLET | Freq: Every day | ORAL | Status: DC
  Administered 2024-06-15 – 2024-06-25 (×11): 81 mg via ORAL
  Filled 2024-06-14 (×11): qty 1

## 2024-06-14 MED ORDER — THIAMINE HCL 100 MG/ML IJ SOLN: 100.0000 mg | Freq: Every day | INTRAMUSCULAR | Status: DC

## 2024-06-14 MED ORDER — ALBUTEROL SULFATE (2.5 MG/3ML) 0.083% IN NEBU: 2.5000 mg | INHALATION_SOLUTION | RESPIRATORY_TRACT | Status: DC | PRN

## 2024-06-14 MED ORDER — LISINOPRIL 5 MG PO TABS
5.0000 mg | ORAL_TABLET | Freq: Every day | ORAL | Status: DC
  Administered 2024-06-15 – 2024-06-20 (×6): 5 mg via ORAL
  Filled 2024-06-14 (×6): qty 1

## 2024-06-14 MED ORDER — ASPIRIN 300 MG RE SUPP: 300.0000 mg | Freq: Every day | RECTAL | Status: DC

## 2024-06-14 MED ORDER — ENOXAPARIN SODIUM 40 MG/0.4ML IJ SOSY
40.0000 mg | PREFILLED_SYRINGE | INTRAMUSCULAR | Status: DC
  Administered 2024-06-14 – 2024-06-18 (×5): 40 mg via SUBCUTANEOUS
  Filled 2024-06-14 (×5): qty 0.4

## 2024-06-14 MED ORDER — CLOPIDOGREL BISULFATE 75 MG PO TABS
75.0000 mg | ORAL_TABLET | Freq: Every day | ORAL | Status: DC
  Administered 2024-06-15 – 2024-06-25 (×11): 75 mg via ORAL
  Filled 2024-06-14 (×11): qty 1

## 2024-06-14 MED ORDER — ATORVASTATIN CALCIUM 10 MG PO TABS: 10.0000 mg | ORAL_TABLET | Freq: Every day | ORAL | Status: DC

## 2024-06-14 MED ORDER — QUETIAPINE FUMARATE 25 MG PO TABS
25.0000 mg | ORAL_TABLET | Freq: Every day | ORAL | Status: AC
  Administered 2024-06-14 – 2024-06-15 (×2): 25 mg via ORAL
  Filled 2024-06-14 (×2): qty 1

## 2024-06-14 MED ORDER — REVEFENACIN 175 MCG/3ML IN SOLN
175.0000 ug | Freq: Every day | RESPIRATORY_TRACT | Status: DC
  Administered 2024-06-16 – 2024-06-25 (×9): 175 ug via RESPIRATORY_TRACT
  Filled 2024-06-14 (×12): qty 3

## 2024-06-14 MED ORDER — ADULT MULTIVITAMIN W/MINERALS CH: 1.0000 | ORAL_TABLET | Freq: Every day | ORAL | Status: DC

## 2024-06-14 MED ORDER — TRAZODONE HCL 50 MG PO TABS
50.0000 mg | ORAL_TABLET | Freq: Every evening | ORAL | Status: DC | PRN
  Administered 2024-06-17 – 2024-06-25 (×4): 50 mg via ORAL
  Filled 2024-06-14 (×6): qty 1

## 2024-06-14 MED ORDER — PANTOPRAZOLE SODIUM 40 MG PO TBEC
40.0000 mg | DELAYED_RELEASE_TABLET | Freq: Every day | ORAL | Status: DC
  Administered 2024-06-15 – 2024-06-25 (×11): 40 mg via ORAL
  Filled 2024-06-14 (×11): qty 1

## 2024-06-14 MED ORDER — ACETAMINOPHEN 160 MG/5ML PO SOLN: 650.0000 mg | ORAL | Status: DC | PRN

## 2024-06-14 MED ORDER — ACETAMINOPHEN 325 MG PO TABS
650.0000 mg | ORAL_TABLET | ORAL | Status: DC | PRN
  Administered 2024-06-20: 650 mg via ORAL
  Filled 2024-06-14: qty 2

## 2024-06-14 MED ORDER — ENSURE PLUS HIGH PROTEIN PO LIQD: 237.0000 mL | Freq: Two times a day (BID) | ORAL | Status: DC

## 2024-06-14 MED ORDER — NICOTINE 14 MG/24HR TD PT24
14.0000 mg | MEDICATED_PATCH | Freq: Every day | TRANSDERMAL | Status: DC
  Administered 2024-06-15 – 2024-06-25 (×11): 14 mg via TRANSDERMAL
  Filled 2024-06-14 (×11): qty 1

## 2024-06-14 MED ORDER — FOLIC ACID 1 MG PO TABS: 1.0000 mg | ORAL_TABLET | Freq: Every day | ORAL | Status: DC

## 2024-06-14 MED ORDER — ACETAMINOPHEN 650 MG RE SUPP: 650.0000 mg | RECTAL | Status: DC | PRN

## 2024-06-14 MED ORDER — ENSURE PLUS HIGH PROTEIN PO LIQD
237.0000 mL | Freq: Two times a day (BID) | ORAL | Status: DC
  Administered 2024-06-15 – 2024-06-24 (×13): 237 mL via ORAL

## 2024-06-14 MED ORDER — CYANOCOBALAMIN 1000 MCG PO TABS
1000.0000 ug | ORAL_TABLET | Freq: Every day | ORAL | Status: DC
Start: 2024-06-15 — End: 2024-06-24

## 2024-06-14 MED ORDER — QUETIAPINE FUMARATE 25 MG PO TABS
25.0000 mg | ORAL_TABLET | Freq: Two times a day (BID) | ORAL | Status: DC | PRN
  Administered 2024-06-16 – 2024-06-23 (×9): 25 mg via ORAL
  Filled 2024-06-14 (×11): qty 1

## 2024-06-14 NOTE — Progress Notes (Signed)
 Signed     Expand All Collapse All PMR Admission Coordinator Pre-Admission Assessment   Patient: Ronald Lowe is an 68 y.o., male MRN: [id number] DOB: 1956/05/02 Height: 6' (182.9 cm) Weight: 68.8 kg   Insurance Information HMO:  yes   PPO:      PCP:      IPA:      80/20:      OTHER:  PRIMARY: [employer]      Policy#: [id number]      Subscriber: pt CM Name: lorraine      Pt. Approved on 6/26 by Caswell Solian at [facility] for 7 days from day of admit. Auth good for 5 days   Phone#:[phone]   Fax#: [phone]     Pre-Cert#: [id number]      Employer:  Benefits:  Phone #:      Name:  Eff. Date: 12/20/23     Deduct: 257, 119.25 met      Out of Pocket Max: 9350       Life Max: none CIR: 2185 copay/admission      SNF: 0/day days 1-20, 214/day days 21-100 Outpatient: 80%     Co-Pay: 20% Home Health: 100%      DME: 80%     Co-Pay: 20% Providers: in network  SECONDARY:   Medicaid of [place]     Policy#:   [id number]       Phone#:    Financial Counselor:       Phone#:    The Engineer, materials Information Summary" for patients in Inpatient Rehabilitation Facilities with attached "Privacy Act Statement-Health Care Records" was provided and verbally reviewed with: Patient and Family   Emergency Contact Information Contact Information       Name Relation Home Work Mobile    Hicks,[name] Spouse [phone]   [phone]    Maritza Mercy Lin     [phone]         Other Contacts   None on File        Current Medical History  Patient Admitting Diagnosis: CVA, withdrawals History of Present Illness: Ronald Lowe is a 68 year old male with past medical history significant for HTN, HLD, paroxysmal atrial fibrillation noncompliant with anticoagulation, GERD, CKD, COPD, EtOH/tobacco use disorder, GERD who presented to [facility] ED on 06/06/2024 via EMS after family found him sitting on the floor confused with right arm weakness.  Last known normal was earlier in the morning at 7:30  AM when he left for work.  Additionally family concerned about increasing episodes of confusion, possible underlying dementia, complicated by his heavy alcohol use.  On arrival to the ED, code stroke was initiated. MR brain without contrast with multiple acute infarcts left MCA, left PCA, left basal ganglia, left posterior limb of the internal capsule, left anterior insula, overlying left frontal and parietal lobes of the left occipital lobe without mass effect. CT angiogram head/neck with occluded proximal left M3 MCA, nearly occlusive stenosis left carotid bifurcation, 55% stenosis right ICA, moderate V2 vertebral artery stenosis due to compression from adjacent osteophyte. UDS positive for THC, cocaine, benzodiazepines, amphetamines. Hemoglobin A1c 4.2. LDL 66. Carotid ultrasound with 60 to 79% stenosis left ICA. Pt. With signs of withdrawal and started on CIWA protocol. He was seen by PT/OT/SLP and they recommended CIR to assist return to PLOF.  Complete NIHSS TOTAL: 7   Patient's medical record from [facility]  has been reviewed by the rehabilitation admission coordinator and physician.   Past Medical History  Past Medical History:  Diagnosis Date   Aortic atherosclerosis (HCC)     Cervical spinal stenosis     Chronic kidney disease     COPD (chronic obstructive pulmonary disease) (HCC)     Coronary artery calcification seen on CT scan     ETOH abuse      a.) 28+ standard drinks/week   GERD (gastroesophageal reflux disease)     History of amiodarone  therapy      a.) in the past; self discontinued   History of medication noncompliance      a.) reported 10/2022 that he has been off NOAC for several months secondary to vertiginous symptoms, not acting like himself, and depression; b.) off/not taking as prescribed: antiarrhythmics, antihypertensives, statins, and MDIs   Hyperlipidemia     Hypertension     Long term current use of anticoagulant      a.) rivaroxaban     PAF (paroxysmal atrial fibrillation) (HCC)      a.) CHA2DS2VASc = 3 (age, HTN, vascular disease history);  b.) rate/rhythm maintained on oral carvediolol (self discontinued diltiazem  + amiodarone  + metoprolol ); chronically anticoagulated with rivaroxaban  (switched from apixaban  11/17/2022)   Tobacco abuse            Has the patient had major surgery during 100 days prior to admission? No   Family History   family history includes CAD in his father; CAD (age of onset: 2) in his brother; Emphysema in his father; Hypertension in his brother; Stroke in his mother.   Current Medications  Current Medications    Current Facility-Administered Medications:    acetaminophen  (TYLENOL ) tablet 650 mg, 650 mg, Oral, Q4H PRN **OR** acetaminophen  (TYLENOL ) 160 MG/5ML solution 650 mg, 650 mg, Per Tube, Q4H PRN **OR** acetaminophen  (TYLENOL ) suppository 650 mg, 650 mg, Rectal, Q4H PRN, Foust, Katy L, NP   albuterol  (PROVENTIL ) (2.5 MG/3ML) 0.083% nebulizer solution 2.5 mg, 2.5 mg, Nebulization, Q4H PRN, Foust, Katy L, NP   arformoterol  (BROVANA ) nebulizer solution 15 mcg, 15 mcg, Nebulization, BID, [place], Eric J, DO, 15 mcg at 06/09/24 9160   aspirin  suppository 300 mg, 300 mg, Rectal, Daily, 300 mg at 06/07/24 1246 **OR** aspirin  tablet 325 mg, 325 mg, Oral, Daily, Bhagat, Srishti L, MD, 325 mg at 06/09/24 0848   budesonide  (PULMICORT ) nebulizer solution 0.5 mg, 0.5 mg, Nebulization, BID, [place], Eric J, DO, 0.5 mg at 06/09/24 9160   clopidogrel  (PLAVIX ) tablet 75 mg, 75 mg, Oral, Daily, Lehner, Erin C, NP, 75 mg at 06/09/24 0848   cyanocobalamin  (VITAMIN B12) tablet 1,000 mcg, 1,000 mcg, Oral, Daily, Jerri Pfeiffer, MD, 1,000 mcg at 06/09/24 1326   enoxaparin  (LOVENOX ) injection 40 mg, 40 mg, Subcutaneous, Q24H, Foust, Katy L, NP, 40 mg at 06/08/24 1744   folic acid  (FOLVITE ) tablet 1 mg, 1 mg, Oral, Daily, Foust, Katy L, NP, 1 mg at 06/09/24 0848   haloperidol  lactate (HALDOL ) injection 2 mg, 2 mg,  Intramuscular, Q6H PRN, [place], Camellia PARAS, DO, 2 mg at 06/09/24 9470   labetalol  (NORMODYNE ) injection 10 mg, 10 mg, Intravenous, Q2H PRN, Foust, Katy L, NP   lactated ringers  infusion, , Intravenous, Continuous, [place], Camellia PARAS, DO, Last Rate: 75 mL/hr at 06/09/24 0857, New Bag at 06/09/24 0857   LORazepam  (ATIVAN ) injection 1 mg, 1 mg, Intravenous, Once, Rosemarie Eather RAMAN, MD   LORazepam  (ATIVAN ) tablet 1-4 mg, 1-4 mg, Oral, Q1H PRN **OR** LORazepam  (ATIVAN ) injection 1-4 mg, 1-4 mg, Intravenous, Q1H PRN, Foust, Katy L, NP, 2 mg at 06/09/24 0030  multivitamin with minerals tablet 1 tablet, 1 tablet, Oral, Daily, Foust, Katy L, NP, 1 tablet at 06/09/24 0848   nicotine  (NICODERM CQ  - dosed in mg/24 hours) patch 14 mg, 14 mg, Transdermal, Daily, Foust, Katy L, NP, 14 mg at 06/09/24 0857   pantoprazole  (PROTONIX ) injection 40 mg, 40 mg, Intravenous, Q24H, Foust, Katy L, NP, 40 mg at 06/09/24 0848   revefenacin  (YUPELRI ) nebulizer solution 175 mcg, 175 mcg, Nebulization, Daily, [place], Eric J, DO, 175 mcg at 06/09/24 9160   sodium chloride  flush (NS) 0.9 % injection 3-10 mL, 3-10 mL, Intravenous, PRN, Foust, Katy L, NP   [COMPLETED] thiamine  (VITAMIN B1) 500 mg in sodium chloride  0.9 % 50 mL IVPB, 500 mg, Intravenous, Q8H, Last Rate: 110 mL/hr at 06/09/24 0025, 500 mg at 06/09/24 0025 **FOLLOWED BY** thiamine  (VITAMIN B1) 250 mg in sodium chloride  0.9 % 50 mL IVPB, 250 mg, Intravenous, Daily, Last Rate: 105 mL/hr at 06/09/24 0853, 250 mg at 06/09/24 0853 **FOLLOWED BY** [START ON 06/15/2024] thiamine  (VITAMIN B1) injection 100 mg, 100 mg, Intravenous, Daily, Bhagat, Srishti L, MD     Patients Current Diet:  Diet Order                  DIET DYS 2 Room service appropriate? Yes with Assist; Fluid consistency: Thin  Diet effective now                         Precautions / Restrictions Precautions Precautions: Fall, Other (comment) Precaution/Restrictions Comments: R forearm xray pending at time of  eval Restrictions Weight Bearing Restrictions Per Provider Order: No    Has the patient had 2 or more falls or a fall with injury in the past year? Yes   Prior Activity Level Community (5-7x/wk): Pt. active in the community PTA   Prior Functional Level Self Care: Did the patient need help bathing, dressing, using the toilet or eating? Independent   Indoor Mobility: Did the patient need assistance with walking from room to room (with or without device)? Independent   Stairs: Did the patient need assistance with internal or external stairs (with or without device)? Independent   Functional Cognition: Did the patient need help planning regular tasks such as shopping or remembering to take medications? Independent   Patient Information Are you of Hispanic, Latino/a,or Spanish origin?: A. No, not of Hispanic, Latino/a, or Spanish origin (proxy) What is your race?: A. White Do you need or want an interpreter to communicate with a doctor or health care staff?: 0. No   Patient's Response To:  Health Literacy and Transportation Is the patient able to respond to health literacy and transportation needs?: No (proxy) Health Literacy - How often do you need to have someone help you when you read instructions, pamphlets, or other written material from your doctor or pharmacy?: Patient unable to respond In the past 12 months, has lack of transportation kept you from medical appointments or from getting medications?: No In the past 12 months, has lack of transportation kept you from meetings, work, or from getting things needed for daily living?: No   Journalist, newspaper / Equipment Home Equipment: None   Prior Device Use: Indicate devices/aids used by the patient prior to current illness, exacerbation or injury? None of the above   Current Functional Level Cognition   Arousal/Alertness: Awake/alert Overall Cognitive Status: No family/caregiver present to determine baseline cognitive  functioning Orientation Level: Oriented to person Attention: Sustained Sustained Attention: Impaired  Sustained Attention Impairment: Verbal basic, Functional basic Memory: Impaired Memory Impairment: Decreased recall of new information Awareness: Impaired Awareness Impairment: Intellectual impairment Problem Solving: Impaired Problem Solving Impairment: Functional basic    Extremity Assessment (includes Sensation/Coordination)   Upper Extremity Assessment: RUE deficits/detail, LUE deficits/detail RUE Deficits / Details: difficult to assess, seemingly reported pain somewhere on the R extremeity. R forearm xray reult pending at the time of evaluation. Pt actively moving arm some, difficult to assess due to impaired cognition RUE Coordination: decreased gross motor, decreased fine motor LUE Deficits / Details: Pt able to lift arm against gravity, does not hold against any resistance. Difficult to truly assess due to impaired cognition LUE Coordination: decreased fine motor, decreased gross motor  Lower Extremity Assessment: Defer to PT evaluation RLE Deficits / Details: AROM on command (SLR and ankle pump), moving in synergistic pattern, unable to follow commands for MMT     ADLs   Overall ADL's : Needs assistance/impaired Eating/Feeding: NPO Grooming: Maximal assistance Upper Body Bathing: Cueing for safety Lower Body Bathing: Total assistance, +2 for physical assistance, +2 for safety/equipment Upper Body Dressing : Maximal assistance Lower Body Dressing: +2 for physical assistance, Total assistance, +2 for safety/equipment Toilet Transfer: Total assistance, +2 for physical assistance, +2 for safety/equipment Toileting- Clothing Manipulation and Hygiene: Total assistance, +2 for physical assistance, +2 for safety/equipment Functional mobility during ADLs: Maximal assistance, +2 for physical assistance, +2 for safety/equipment General ADL Comments: assist due to impaired cognition,  poor sitting balance, R weakness,     Mobility   Overal bed mobility: Needs Assistance Bed Mobility: Supine to Sit, Sit to Supine Supine to sit: +2 for physical assistance, Mod assist, HOB elevated Sit to supine: +2 for physical assistance, Mod assist, HOB elevated General bed mobility comments: assist with trunk and RLE     Transfers   General transfer comment: unable to safely progress beyond edge of stretcher in ED     Ambulation / Gait / Stairs / Wheelchair Mobility         Posture / Balance Dynamic Sitting Balance Sitting balance - Comments: mod assist to maintain sitting balance, increased R lean with fatigue Balance Overall balance assessment: Needs assistance Sitting-balance support: Bilateral upper extremity supported Sitting balance-Leahy Scale: Poor Sitting balance - Comments: mod assist to maintain sitting balance, increased R lean with fatigue Postural control: Right lateral lean     Special needs/care consideration Bowel and Bladder incontinence and External Urinary Catheter    Previous Home Environment (from acute therapy documentation) Living Arrangements: Spouse/significant other  Lives With: Spouse Available Help at Discharge: Family Type of Home: House Home Layout: One level Home Access: Stairs to enter Secretary/administrator of Steps: 1 Bathroom Shower/Tub: Engineer, manufacturing systems: Standard Bathroom Accessibility: Yes How Accessible: Accessible via walker Home Care Services: No Additional Comments: Pt unable to provide hx. Most info obtained from previous admission (2022).   Discharge Living Setting Plans for Discharge Living Setting: Patient's home Type of Home at Discharge: House Discharge Home Layout: One level Discharge Home Access: Stairs to enter Entrance Stairs-Rails: None Entrance Stairs-Number of Steps: 1 Discharge Bathroom Shower/Tub: Tub/shower unit Discharge Bathroom Toilet: Standard Discharge Bathroom Accessibility: Yes How  Accessible: Accessible via walker Does the patient have any problems obtaining your medications?: No   Social/Family/Support Systems Patient Roles: Spouse Contact Information: [phone] Anticipated Caregiver: Maeola Irving Caregiver Availability: 24/7 Discharge Plan Discussed with Primary Caregiver: Yes Is Caregiver In Agreement with Plan?: Yes Does Caregiver/Family have Issues with Lodging/Transportation while Pt  is in Rehab?: No   Goals Patient/Family Goal for Rehab: PT/OT/SLPMin A Expected length of stay: 14-16 days Pt/Family Agrees to Admission and willing to participate: Yes Program Orientation Provided & Reviewed with Pt/Caregiver Including Roles  & Responsibilities: Yes   Decrease burden of Care through IP rehab admission: not anticipated   Possible need for SNF placement upon discharge: not anticipated   Patient Condition: This patient's medical and functional status has changed since the consult dated: 06/11/24 in which the Rehabilitation Physician determined and documented that the patient's condition is appropriate for intensive rehabilitative care in an inpatient rehabilitation facility. See History of Present Illness (above) for medical update. Functional changes are: Pt currently Min A +2-Mod A +2 with mobility and Max A with ADLs. Patient's medical and functional status update has been discussed with the Rehabilitation physician and patient remains appropriate for inpatient rehabilitation. Will admit to inpatient rehab today.   Preadmission Screen Completed By:  Leita KATHEE Kleine, 06/09/2024 2:36 PM ______________________________________________________________________   Discussed status with Dr. Emeline on 06/14/24 at 11:09 AM and received approval for admission today.   Admission Coordinator:  Leita KATHEE Kleine, CCC-SLP, time 11:09 AM/Date 06/14/24     Assessment/Plan: Diagnosis: L MCA CVA d/t L ICA stenosis Does the need for close, 24 hr/day Medical supervision in  concert with the patient's rehab needs make it unreasonable for this patient to be served in a less intensive setting? Yes Co-Morbidities requiring supervision/potential complications: Alcohol withdrawal/mild Delirium tremens, metabolic encephalopathy/delirium, hypomagnasemia, hypokalemia, AKI on CKD, HTN, A fib, COPD Due to bladder management, bowel management, safety, skin/wound care, disease management, medication administration, pain management, and patient education, does the patient require 24 hr/day rehab nursing? Yes Does the patient require coordinated care of a physician, rehab nurse, PT, OT, and SLP to address physical and functional deficits in the context of the above medical diagnosis(es)? Yes Addressing deficits in the following areas: balance, endurance, locomotion, strength, transferring, bowel/bladder control, bathing, dressing, feeding, grooming, toileting, cognition, speech, language, and psychosocial support Can the patient actively participate in an intensive therapy program of at least 3 hrs of therapy 5 days a week? Yes The potential for patient to make measurable gains while on inpatient rehab is good Anticipated functional outcomes upon discharge from inpatient rehab: min assist PT, min assist OT, min assist SLP Estimated rehab length of stay to reach the above functional goals is: 14-16 days Anticipated discharge destination: Home 10. Overall Rehab/Functional Prognosis: good     MD Signature:   Joesph JAYSON Emeline, DO 06/14/2024

## 2024-06-14 NOTE — Progress Notes (Signed)
 Inpatient Rehab Admissions Coordinator:  There is a bed available for pt in CIR today. Dr. Jonel is aware and in agreement. Pt, pt's wife, TOC and NSG aware.   Tinnie Yvone Cohens, MS, CCC-SLP Admissions Coordinator [phone]

## 2024-06-14 NOTE — H&P (Signed)
 Physical Medicine and Rehabilitation Admission H&P        Chief Complaint  Patient presents with   Code Stroke  : HPI: Ronald Lowe is a 68 year old right-handed male with history significant for hypertension, hepatitis C, hyperlipidemia, paroxysmal atrial fibrillation noncompliant with anticoagulation, GERD, CKD with creatinine baseline 0.94-1.53, COPD followed by pulmonary services, alcohol/tobacco use.  Per chart review patient lives with spouse.  1 level home one-step to entry.  Modified independent prior to admission and reportedly still working.  Presented 06/06/2024 with altered mental status and right side weakness.  Cranial CT scan showed subtle hypoattenuation in the anterior left insula concerning for acute infarct.  Moderate chronic microvascular ischemic disease.  CTA with occluded proximal left M3 MCA.  Critical nearly occlusive stenosis of left carotid bifurcation.  Degree of stenosis was difficult to quantify given the degree of bulky calcific atherosclerosis but likely at 80%.  Approximately 55% stenosis of the right ICA.  Patient did not receive tPA.  MRI showed multiple acute infarcts in the left MCA and left PCA territories, including left basal ganglia, left posterior limb of the internal capsule, left anterior insula and overlying left frontal and parietal lobes in the left occipital lobe.  No mass effect.  Admission chemistries positive cocaine benzos and amphetamines, alcohol negative, creatinine 1.53, total bilirubin 2.0.  Echocardiogram with ejection fraction of 60 to 65% no wall motion abnormalities grade 1 diastolic dysfunction.  Vascular surgery Dr. [name] for symptomatic left carotid stenosis and at this time wished to hold on any surgical intervention until mental status improved and this could be addressed as an outpatient.  He was cleared to begin aspirin  and Plavix  for CVA prophylaxis.  Subcutaneous Lovenox  for DVT prophylaxis.  Currently on dysphagia #2 thin  liquid diet.  Therapy evaluations completed due to patient decreased functional mobility was admitted for a comprehensive rehab program   Review of Systems  Constitutional:  Negative for chills and fever.  HENT:  Negative for hearing loss.   Eyes:  Negative for blurred vision and double vision.  Respiratory:  Negative for cough and wheezing.        Shortness of breath with heavy exertion  Cardiovascular:  Positive for palpitations.  Gastrointestinal:  Positive for constipation. Negative for heartburn, nausea and vomiting.       GERD  Genitourinary:  Positive for urgency. Negative for dysuria, flank pain and hematuria.  Musculoskeletal:  Positive for myalgias.  Skin:  Negative for rash.  Neurological:  Positive for dizziness, weakness and headaches.  All other systems reviewed and are negative.       Past Medical History:  Diagnosis Date   Aortic atherosclerosis (HCC)     Cervical spinal stenosis     Chronic kidney disease     COPD (chronic obstructive pulmonary disease) (HCC)     Coronary artery calcification seen on CT scan     ETOH abuse      a.) 28+ standard drinks/week   GERD (gastroesophageal reflux disease)     History of amiodarone  therapy      a.) in the past; self discontinued   History of medication noncompliance      a.) reported 10/2022 that he has been off NOAC for several months secondary to vertiginous symptoms, not acting like himself, and depression; b.) off/not taking as prescribed: antiarrhythmics, antihypertensives, statins, and MDIs   Hyperlipidemia     Hypertension     Long term current use of anticoagulant  a.) rivaroxaban    PAF (paroxysmal atrial fibrillation) (HCC)      a.) CHA2DS2VASc = 3 (age, HTN, vascular disease history);  b.) rate/rhythm maintained on oral carvediolol (self discontinued diltiazem  + amiodarone  + metoprolol ); chronically anticoagulated with rivaroxaban  (switched from apixaban  11/17/2022)   Tobacco abuse               Past  Surgical History:  Procedure Laterality Date   SHOULDER ARTHROSCOPY WITH SUBACROMIAL DECOMPRESSION, ROTATOR CUFF REPAIR AND BICEP TENDON REPAIR Left 12/07/2022    Procedure: SHOULDER ARTHROSCOPY WITH DEBRIDEMENT, DECOMPRESSION, ROTATOR CUFF REPAIR AND BICEPS TENODESIS.- RNFA;  Surgeon: Edie Norleen PARAS, MD;  Location: [facility];  Service: Orthopedics;  Laterality: Left;   SHOULDER ARTHROSCOPY WITH SUBACROMIAL DECOMPRESSION, ROTATOR CUFF REPAIR AND BICEP TENDON REPAIR Left 02/02/2023    Procedure: LEFT SHOULDER ARTHROSCOPY WITH DEBRIDEMENT AND REPAIR OF RECURRENT ROTATOR CUFF TEAR OF LEFT SHOULDER;  Surgeon: Edie Norleen PARAS, MD;  Location: [facility];  Service: Orthopedics;  Laterality: Left;   TONSILLECTOMY                 Family History  Problem Relation Age of Onset   Emphysema Father     CAD Father          CABG   Stroke Mother     CAD Brother 27        CABG   Hypertension Brother          Social History:  reports that he has been smoking cigarettes. He started smoking about 53 years ago. He has a 13.4 pack-year smoking history. He has quit using smokeless tobacco. He reports current alcohol use of about 28.0 standard drinks of alcohol per week. He reports current drug use. Drug: Marijuana. Allergies:  Allergies  No Known Allergies         Medications Prior to Admission  Medication Sig Dispense Refill   albuterol  (VENTOLIN  HFA) 108 (90 Base) MCG/ACT inhaler INHALE 2 PUFFS BY MOUTH EVERY 4 HOURS AS NEEDED FOR WHEEZE OR FOR SHORTNESS OF BREATH 18 each 5   famotidine  (PEPCID ) 40 MG tablet TAKE 1 TABLET EVERY DAY 90 tablet 3   hydrOXYzine  (VISTARIL ) 25 MG capsule TAKE 1 CAPSULE (25 MG TOTAL) BY MOUTH EVERY 6 (SIX) HOURS AS NEEDED. 360 capsule 1   TRELEGY ELLIPTA  100-62.5-25 MCG/ACT AEPB INHALE 1 PUFF EVERY DAY 180 each 0   lisinopril  (ZESTRIL ) 5 MG tablet TAKE 1 TABLET EVERY DAY (Patient not taking: Reported on 06/07/2024) 90 tablet 3              Home: Home Living Family/patient  expects to be discharged to:: Private residence Living Arrangements: Spouse/significant other Available Help at Discharge: Family, Available 24 hours/day Type of Home: House Home Access: Stairs to enter [employer] of Steps: 2 Entrance Stairs-Rails: Right, Left Home Layout: One level Bathroom Shower/Tub: Engineer, manufacturing systems: Standard Bathroom Accessibility: Yes Home Equipment: None Additional Comments: Pt unable to provide hx. Most info obtained from previous admission (2022).  Lives With: Spouse   Functional History: Prior Function Prior Level of Function : Independent/Modified Independent, Patient poor historian/Family not available, Working/employed Mobility Comments: 3 falls   Functional Status:  Mobility: Bed Mobility Overal bed mobility: Needs Assistance Bed Mobility: Supine to Sit, Sit to Supine Supine to sit: Max assist Sit to supine: Max assist General bed mobility comments: increased assist due to lack of initiation and command following Transfers Overall transfer level: Needs assistance Equipment used: 2 person hand held assist  Transfers: Sit to/from Stand Sit to Stand: Min assist, +2 safety/equipment General transfer comment: From EOB and chair with arm rests with min A for power up and stability Ambulation/Gait Ambulation/Gait assistance: Mod assist, Min assist, +2 physical assistance, +2 safety/equipment Gait Distance (Feet): 5 Feet (+15) Assistive device: 2 person hand held assist Gait Pattern/deviations: Step-through pattern, Ataxic, Decreased stride length General Gait Details: Pt demonstrates unsteady gait requiring min-mod A +2 for balance and safety throughout. Increased unsteadiness and assist required during turns and backwards/lateral stepping due to impaired cognition and balance. Pt required assist to advance LLE when lateral stepping due to lack of initiation   ADL: ADL Overall ADL's : Needs  assistance/impaired Eating/Feeding: NPO Grooming: Moderate assistance Grooming Details (indicate cue type and reason): unable to bring hand all the way to his face Upper Body Bathing: Cueing for safety Lower Body Bathing: Total assistance, +2 for physical assistance, +2 for safety/equipment Upper Body Dressing : Maximal assistance Lower Body Dressing: Maximal assistance, Bed level Lower Body Dressing Details (indicate cue type and reason): to don socks, did not lift RLE from bed to assist Toilet Transfer: +2 for physical assistance, +2 for safety/equipment, Minimal assistance, Ambulation, Cueing for sequencing Toilet Transfer Details (indicate cue type and reason): 2 person HHA Toileting- Clothing Manipulation and Hygiene: Total assistance, +2 for physical assistance, +2 for safety/equipment Functional mobility during ADLs: +2 for physical assistance, +2 for safety/equipment, Minimal assistance, Moderate assistance, Cueing for safety, Cueing for sequencing (2 person HHA) General ADL Comments: limited by impaired cognition, midline awareness and weakness.   Cognition: Cognition Overall Cognitive Status: No family/caregiver present to determine baseline cognitive functioning Arousal/Alertness: Awake/alert Orientation Level: Oriented to person, Oriented to situation, Disoriented to place, Disoriented to time Attention: Sustained Sustained Attention: Impaired Sustained Attention Impairment: Verbal basic, Functional basic Memory: Impaired Memory Impairment: Decreased recall of new information Awareness: Impaired Awareness Impairment: Intellectual impairment Problem Solving: Impaired Problem Solving Impairment: Functional basic Cognition Arousal: Alert Behavior During Therapy: WFL for tasks assessed/performed Overall Cognitive Status: No family/caregiver present to determine baseline cognitive functioning   Physical Exam: Blood pressure (!) 169/115, pulse 77, temperature 98.4 F (36.9  C), resp. rate 18, height 6' (1.829 m), weight 68.8 kg, SpO2 100%. Physical Exam   PE: Constitution: Appropriate appearance for age. No apparent distress  Resp: No respiratory distress. No accessory muscle usage. on RA and CTAB Cardio: Well perfused appearance. No peripheral edema. Abdomen: Nondistended. Nontender.   Psych: Appropriate mood and affect. Skin: PIV intact  Neuro: AAOx3; not time with cues + concentration and memory deficits Can spell world backwards, cannot add change. Cannot compare objects. dysarthria; mostly fluent speech.   DTRs: Reflexes were 2+ in bilateral achilles, patella, biceps, BR and triceps. Babinsky: flexor responses b/l.   Hoffmans: negative b/l Sensory exam: revealed normal sensation in all dermatomal regions in bilateral upper extremities and bilateral lower extremities Motor exam: Poor participation; grossly 4+/5 RUE and RLE; 5/5 LUE and LLE Coordination: Fine motor coordination was normal.      Neurological:     Comments: Patient is alert and makes eye contact with examiner.  Speech is dysarthric.  He was able to answer some basic questions with some delay in processing and very limited medical historian.  He was able to name 2/4 and able to repeat with dysarthric speech.      Lab Results Last 48 Hours        Results for orders placed or performed during the hospital encounter of 06/06/24 (from the  past 48 hours)  Basic metabolic panel with GFR     Status: Abnormal    Collection Time: 06/13/24  4:23 AM  Result Value Ref Range    Sodium 138 135 - 145 mmol/L    Potassium 4.1 3.5 - 5.1 mmol/L    Chloride 106 98 - 111 mmol/L    CO2 17 (L) 22 - 32 mmol/L    Glucose, Bld 94 70 - 99 mg/dL      Comment: Glucose reference range applies only to samples taken after fasting for at least 8 hours.    BUN 15 8 - 23 mg/dL    Creatinine, Ser 9.11 0.61 - 1.24 mg/dL    Calcium  8.7 (L) 8.9 - 10.3 mg/dL    GFR, Estimated >39 >39 mL/min      Comment:  (NOTE) Calculated using the CKD-EPI Creatinine Equation (2021)      Anion gap 15 5 - 15      Comment: Performed at [facility], [address], [place], [place] [address]  CBC     Status: Abnormal    Collection Time: 06/13/24  4:23 AM  Result Value Ref Range    WBC 5.6 4.0 - 10.5 K/uL    RBC 4.15 (L) 4.22 - 5.81 MIL/uL    Hemoglobin 15.6 13.0 - 17.0 g/dL    HCT 55.0 60.9 - 47.9 %    MCV 108.2 (H) 80.0 - 100.0 fL    MCH 37.6 (H) 26.0 - 34.0 pg    MCHC 34.7 30.0 - 36.0 g/dL    RDW 86.0 88.4 - 84.4 %    Platelets 218 150 - 400 K/uL    nRBC 0.0 0.0 - 0.2 %      Comment: Performed at [facility], [address] [place], [place] [address]      Imaging Results (Last 48 hours)  No results found.         Blood pressure (!) 169/115, pulse 77, temperature 98.4 F (36.9 C), resp. rate 18, height 6' (1.829 m), weight 68.8 kg, SpO2 100%.   Medical Problem List and Plan: 1. Functional deficits secondary to left MCA scattered infarct likely etiology due to symptomatic proximal ICA stenosis             -patient may  shower             -ELOS/Goals: 14-16 days, Min A PT/OT/SLP   - stable for IRF  2.  Antithrombotics: -DVT/anticoagulation:  Pharmaceutical: Lovenox              -antiplatelet therapy: Aspirin  81 mg daily and Plavix  75 mg daily until after left carotid surgery  3. Pain Management: Tylenol  as needed 4. Mood/Behavior/Sleep/alcohol withdrawal/delirium: Provide emotional support             -antipsychotic agents: seroquel  25 mg at bedtime + BID PRN   - Veil bed for high fall risk, attempting OOB, confusion   - delirium precautions   - add sleep log, trazodone 50 mg at bedtime PRN for sleep  5. Neuropsych/cognition: This patient is not capable of making decisions on his own behalf. 6. Skin/Wound Care: Routine skin checks 7. Fluids/Electrolytes/Nutrition: Routine in and outs with follow-up chemistries 8.  Dysphagia.  Dysphagia #2 thin liquids.  Follow-up speech  therapy 9.  COPD/tobacco use.  Continue inhalers.  NicoDerm patch.  Provide counseling 10.  PAF.  Noncompliant anticoagulation.  Cardiac rate controlled 11.  History of alcohol use.  Monitor for any  signs of withdrawal.  Provide counseling 12.  AKI/CKD.  Follow-up chemistries 13.  GERD.  Protonix  14.  Hypertension.  Patient on lisinopril  5 mg day prior to admission but noncompliant.  Monitor with increased mobility 15.  Symptomatic left ICA stenosis.  Follow-up outpatient vascular surgery Dr. [name] Toribio JINNY Pegge, PA-C 06/14/2024  I have examined the patient independently and edited the note for HPI, ROS, exam, assessment, and plan as appropriate. I am in agreement with the above recommendations.   Ronald JAYSON Likes, DO 06/14/2024

## 2024-06-14 NOTE — Progress Notes (Signed)
 Unable to obtain patient height and weight due to enclosure bed, confusion and inability to transfer to standing scale. Will pass along in report for tomorrow's day shift nurse to work with therapy to obtain

## 2024-06-14 NOTE — Discharge Summary (Signed)
 Physician Discharge Summary   Patient: Ronald Lowe MRN: [id number] DOB: 14-Nov-1956  Admit date:     06/06/2024  Discharge date: 06/14/24  Discharge Physician: Lonni SHAUNNA Dalton   PCP: Tanda Bleacher, MD     Recommendations at discharge:  Follow up with Neurology for stroke in 6-8 weeks Check LDL and LFTs in 6 weeks on new Lipitor       Hospital Course: 68 y.o. M with HTN, polysubstance use, AF not on AC due to nonadherence, smoking, CKD IIIa baseline 1.4, COPD who presented with confusion and right arm weakness.  Found to have acute stroke, left-sided carotid disease.  Vascular surgery consulted.   Acute left MCA multifocal CVA likely due to symptomatic proximal left ICA stenosis Left ICA stenosis - Non-invasive angiography showed occluded proximal left M3 MCA, nearly occlusive stenosis at left carotid bifurcation, right ICA stenosis 55%, moderate V2 vertebral stenosis. - Carotid Doppler showed 60 to 79% stenosis left ICA - Echocardiogram showed no cardiogenic source of embolism - LDL less than 70 - Started on aspirin  and Plavix  - Evaluation for arrhythmia/atrial fibrillation: none present on monitoring - tPA not given because outside window  - Dysphagia screen ordered in ER - PT eval ordered - Smoking cessation teaching provided     Acute metabolic encephalopathy He remains delirious.  This is a combination of stroke, likely alcohol-related cognitive impairment.  No improvement with high dose thiamine .   - Continue thiamine  - PT/OT - Continue short term Seroquel  at night, taper off after rehab      Hypomagnesemia Hypokalemia  Treated and resolved.   EtOH use disorder with withdrawal Mild delirium tremens He does not appear to be in active withdrawal.  No physiologic signs of withdrawal any more, CIWAs were <7 for more than 48 hours before being stopped.     Acute kidney injury on chronic kidney disease stage IIIa  AKI resolved    Hypertension On  lisinopril    Paroxysmal Atrial Fibrillation No change, not on AC, agree with Neurology, not a good candidate in setting of alcohol use, fall risk and noncompliance   COPD No evidence of flare stable on Trelegy              The [place]  Controlled Substances Registry was reviewed for this patient prior to discharge.    Disposition: Inpatient rehab Diet recommendation:  Discharge Diet Orders (From admission, onward)     Start     Ordered   06/14/24 0000  Diet - low sodium heart healthy        06/14/24 1037             DISCHARGE MEDICATION: Allergies as of 06/14/2024   No Known Allergies      Medication List     STOP taking these medications    hydrOXYzine  25 MG capsule Commonly known as: VISTARIL        TAKE these medications    albuterol  108 (90 Base) MCG/ACT inhaler Commonly known as: VENTOLIN  HFA INHALE 2 PUFFS BY MOUTH EVERY 4 HOURS AS NEEDED FOR WHEEZE OR FOR SHORTNESS OF BREATH   aspirin  325 MG tablet Take 1 tablet (325 mg total) by mouth daily. Start taking on: June 15, 2024   atorvastatin  10 MG tablet Commonly known as: Lipitor Take 1 tablet (10 mg total) by mouth daily.   clopidogrel  75 MG tablet Commonly known as: PLAVIX  Take 1 tablet (75 mg total) by mouth daily. Start taking on: June 15, 2024   cyanocobalamin  1000  MCG tablet Take 1 tablet (1,000 mcg total) by mouth daily. Start taking on: June 15, 2024   famotidine  40 MG tablet Commonly known as: PEPCID  TAKE 1 TABLET EVERY DAY   feeding supplement Liqd Take 237 mLs by mouth 2 (two) times daily between meals.   folic acid  1 MG tablet Commonly known as: FOLVITE  Take 1 tablet (1 mg total) by mouth daily. Start taking on: June 15, 2024   lisinopril  5 MG tablet Commonly known as: ZESTRIL  TAKE 1 TABLET EVERY DAY   multivitamin with minerals Tabs tablet Take 1 tablet by mouth daily. Start taking on: June 15, 2024   nicotine  14 mg/24hr patch Commonly known as:  NICODERM CQ  - dosed in mg/24 hours Place 1 patch (14 mg total) onto the skin daily. Start taking on: June 15, 2024   QUEtiapine  25 MG tablet Commonly known as: SEROQUEL  Take 1 tablet (25 mg total) by mouth at bedtime.   thiamine  100 MG/ML injection Commonly known as: VITAMIN B1 Inject 1 mL (100 mg total) into the vein daily. Start taking on: June 15, 2024   Trelegy Ellipta  100-62.5-25 MCG/ACT Aepb Generic drug: Fluticasone -Umeclidin-Vilant INHALE 1 PUFF EVERY DAY        Follow-up Information     [facility]. Schedule an appointment as soon as possible for a visit in 1 month(s).   Specialty: Neurology Why: stroke clinic Contact information: [address] [place]  [address] [phone]                Discharge Instructions     Ambulatory referral to Neurology   Complete by: As directed    Follow up with stroke clinic NP at [facility] in about 4-6 weeks. Thanks.   Diet - low sodium heart healthy   Complete by: As directed    Increase activity slowly   Complete by: As directed        Discharge Exam: Filed Weights   06/06/24 2200 06/06/24 2236  Weight: 68.8 kg 68.8 kg    General: Pt is alert, awake, not in acute distress Cardiovascular: RRR, nl S1-S2, no murmurs appreciated.   No LE edema.   Respiratory: Normal respiratory rate and rhythm.  CTAB without rales or wheezes. Abdominal: Abdomen soft and non-tender.  No distension or HSM.   Neuro/Psych:   Confused but cooperative.  Dysarthric at baseline. Right arm weakness.   Condition at discharge: Fair  The results of significant diagnostics from this hospitalization (including imaging, microbiology, ancillary and laboratory) are listed below for reference.   Imaging Studies: VAS US  UPPER EXTREMITY VENOUS DUPLEX Result Date: 06/10/2024 UPPER VENOUS STUDY  Patient Name:  Ronald Lowe  Date of Exam:   06/09/2024 Medical Rec #: [id number]       Accession #:     [id number] Date of Birth: May 25, 1956       Patient Gender: M Patient Age:   17 years Exam Location:  [facility] Procedure:      VAS US  UPPER EXTREMITY VENOUS DUPLEX Referring Phys: ARY XU --------------------------------------------------------------------------------  Indications: Swelling Risk Factors: None identified. Comparison Study: No prior studies. Performing Technologist: Cordella Collet RVT  Examination Guidelines: A complete evaluation includes B-mode imaging, spectral Doppler, color Doppler, and power Doppler as needed of all accessible portions of each vessel. Bilateral testing is considered an integral part of a complete examination. Limited examinations for reoccurring indications may be performed as noted.  Right Findings: +----------+------------+---------+-----------+----------+-------+ RIGHT     CompressiblePhasicitySpontaneousPropertiesSummary +----------+------------+---------+-----------+----------+-------+  IJV           Full       Yes       Yes                      +----------+------------+---------+-----------+----------+-------+ Subclavian               Yes       Yes                      +----------+------------+---------+-----------+----------+-------+ Axillary      Full       Yes       Yes                      +----------+------------+---------+-----------+----------+-------+ Brachial      Full                                          +----------+------------+---------+-----------+----------+-------+ Radial        Full                                          +----------+------------+---------+-----------+----------+-------+ Ulnar         Full                                          +----------+------------+---------+-----------+----------+-------+ Cephalic      Full                                          +----------+------------+---------+-----------+----------+-------+ Basilic       Full                                           +----------+------------+---------+-----------+----------+-------+  Left Findings: +----------+------------+---------+-----------+----------+-------+ LEFT      CompressiblePhasicitySpontaneousPropertiesSummary +----------+------------+---------+-----------+----------+-------+ Subclavian               Yes       Yes                      +----------+------------+---------+-----------+----------+-------+  Summary:  Right: No evidence of deep vein thrombosis in the upper extremity. No evidence of superficial vein thrombosis in the upper extremity.  Left: No evidence of thrombosis in the subclavian.  *See table(s) above for measurements and observations.  Diagnosing physician: Lonni Gaskins MD Electronically signed by Lonni Gaskins MD on 06/10/2024 at 8:20:12 AM.    Final    VAS US  CAROTID Result Date: 06/10/2024 Carotid Arterial Duplex Study Patient Name:  Ronald Lowe  Date of Exam:   06/07/2024 Medical Rec #: [id number]       Accession #:    [id number] Date of Birth: [birth year]       Patient Gender: M Patient Age:   9 years Exam Location:  [facility] Procedure:      VAS US  CAROTID Referring Phys: PRAMOD SETHI --------------------------------------------------------------------------------  Indications:       Carotid artery disease. Risk  Factors:      Hypertension, hyperlipidemia, current smoker, coronary artery                    disease, PAD. Other Factors:     GERD, COPD, EtOH, CKD. Comparison Study:  CT Angio neck - Right ICA approximately 55% stenosis.                    Left ICA, nearly occlusive stenosis at the left carotid                    bifurcation with bulky calcific artherosclerosis. Performing Technologist: Ricka Sturdivant-Jones RDMS, RVT  Examination Guidelines: A complete evaluation includes B-mode imaging, spectral Doppler, color Doppler, and power Doppler as needed of all accessible portions of each vessel. Bilateral testing is considered an  integral part of a complete examination. Limited examinations for reoccurring indications may be performed as noted.  Right Carotid Findings: +----------+--------+--------+--------+------------------+---------+           PSV cm/sEDV cm/sStenosisPlaque DescriptionComments  +----------+--------+--------+--------+------------------+---------+ CCA Prox  78      18                                          +----------+--------+--------+--------+------------------+---------+ CCA Distal79      22                                          +----------+--------+--------+--------+------------------+---------+ ICA Prox  111     38      1-39%   calcific          Shadowing +----------+--------+--------+--------+------------------+---------+ ICA Mid   102     22                                          +----------+--------+--------+--------+------------------+---------+ ICA Distal78      30                                          +----------+--------+--------+--------+------------------+---------+ ECA       137     26                                          +----------+--------+--------+--------+------------------+---------+ +----------+--------+-------+----------------+-------------------+           PSV cm/sEDV cmsDescribe        Arm Pressure (mmHG) +----------+--------+-------+----------------+-------------------+ Subclavian120            Multiphasic, WNL                    +----------+--------+-------+----------------+-------------------+ +---------+--------+--+--------+--+---------+ VertebralPSV cm/s57EDV cm/s15Antegrade +---------+--------+--+--------+--+---------+  Left Carotid Findings: +----------+--------+--------+--------+------------------+---------+           PSV cm/sEDV cm/sStenosisPlaque DescriptionComments  +----------+--------+--------+--------+------------------+---------+ CCA Prox  61      13                                           +----------+--------+--------+--------+------------------+---------+  CCA Distal44      11                                          +----------+--------+--------+--------+------------------+---------+ ICA Prox  311     90      60-79%  calcific          Shadowing +----------+--------+--------+--------+------------------+---------+ ICA Mid   49      18                                          +----------+--------+--------+--------+------------------+---------+ ICA Distal49      18                                          +----------+--------+--------+--------+------------------+---------+ ECA       43      13                                          +----------+--------+--------+--------+------------------+---------+ +----------+--------+--------+----------------+-------------------+           PSV cm/sEDV cm/sDescribe        Arm Pressure (mmHG) +----------+--------+--------+----------------+-------------------+ [id number]             Multiphasic, WNL                    +----------+--------+--------+----------------+-------------------+ +---------+--------+--+--------+--+---------+ VertebralPSV cm/s85EDV cm/s19Antegrade +---------+--------+--+--------+--+---------+   Summary: Right Carotid: Velocities in the right ICA are consistent with a 1-39% stenosis.                ICA velocities suggest upper end of scale. May be underestimated                due to heavey calcified plaque shadowing. Left Carotid: Velocities in the left ICA are consistent with a 60-79% stenosis.               ICA velocities suggest upper end of scale. May be underestimated               due to heavy calcified plaque shadowing.  *See table(s) above for measurements and observations.  Electronically signed by Eather Popp MD on 06/10/2024 at 6:44:39 AM.    Final    ECHOCARDIOGRAM COMPLETE Result Date: 06/09/2024    ECHOCARDIOGRAM REPORT   Patient Name:   Ronald Lowe Date of Exam:  06/09/2024 Medical Rec #:  [id number]      Height:       72.0 in Accession #:    [id number]     Weight:       151.7 lb Date of Birth:  21-Jun-1956      BSA:          1.894 m Patient Age:    67 years       BP:           98/73 mmHg Patient Gender: M              HR:           85 bpm. Exam Location:  Inpatient Procedure: 2D Echo, Cardiac Doppler and Color Doppler (Both  Spectral and Color            Flow Doppler were utilized during procedure). Indications:    Stroke I63.9  History:        Patient has no prior history of Echocardiogram examinations.                 Stroke and COPD, Arrythmias:Atrial Fibrillation,                 Signs/Symptoms:Hypotension; Risk Factors:Hypertension,                 Dyslipidemia and Current Smoker.  Sonographer:    Thea Norlander RCS Referring Phys: ERIN C LEHNER  Sonographer Comments: Image acquisition challenging due to uncooperative patient. IMPRESSIONS  1. Left ventricular ejection fraction, by estimation, is 60 to 65%. The left ventricle has normal function. The left ventricle has no regional wall motion abnormalities. There is severe asymmetric left ventricular hypertrophy of the septal segment. Left  ventricular diastolic parameters are consistent with Grade I diastolic dysfunction (impaired relaxation).  2. Right ventricular systolic function is normal. The right ventricular size is normal.  3. A small pericardial effusion is present. The pericardial effusion is localized near the right ventricle. There is no evidence of cardiac tamponade.  4. The mitral valve is abnormal. Trivial mitral valve regurgitation.  5. The aortic valve was not well visualized. Aortic valve regurgitation is not visualized. Aortic valve sclerosis/calcification is present, without any evidence of aortic stenosis. Comparison(s): No prior Echocardiogram. FINDINGS  Left Ventricle: Left ventricular ejection fraction, by estimation, is 60 to 65%. The left ventricle has normal function. The left ventricle has no  regional wall motion abnormalities. The left ventricular internal cavity size was normal in size. There is  severe asymmetric left ventricular hypertrophy of the septal segment. Left ventricular diastolic parameters are consistent with Grade I diastolic dysfunction (impaired relaxation). Indeterminate filling pressures. Right Ventricle: The right ventricular size is normal. No increase in right ventricular wall thickness. Right ventricular systolic function is normal. Left Atrium: Left atrial size was normal in size. Right Atrium: Right atrial size was normal in size. Pericardium: A small pericardial effusion is present. The pericardial effusion is localized near the right ventricle. There is no evidence of cardiac tamponade. Mitral Valve: The mitral valve is abnormal. Mild mitral annular calcification. Trivial mitral valve regurgitation. Tricuspid Valve: The tricuspid valve is not well visualized. Tricuspid valve regurgitation is not demonstrated. Aortic Valve: The aortic valve was not well visualized. Aortic valve regurgitation is not visualized. Aortic valve sclerosis/calcification is present, without any evidence of aortic stenosis. Aortic valve peak gradient measures 8.8 mmHg. Pulmonic Valve: The pulmonic valve was not well visualized. Pulmonic valve regurgitation is not visualized. Aorta: The aortic root and ascending aorta are structurally normal, with no evidence of dilitation. IAS/Shunts: The interatrial septum was not well visualized.  LEFT VENTRICLE PLAX 2D LVIDd:         3.00 cm Diastology LVIDs:         2.10 cm LV e' medial:    5.00 cm/s LV PW:         1.10 cm LV E/e' medial:  11.9 LV IVS:        1.70 cm LV e' lateral:   8.49 cm/s                        LV E/e' lateral: 7.0  RIGHT VENTRICLE RV S prime:     18.60 cm/s TAPSE (  M-mode): 2.7 cm LEFT ATRIUM             Index        RIGHT ATRIUM           Index LA diam:        3.60 cm 1.90 cm/m   RA Area:     13.30 cm LA Vol (A2C):   43.8 ml 23.12 ml/m  RA  Volume:   26.30 ml  13.88 ml/m LA Vol (A4C):   54.0 ml 28.51 ml/m LA Biplane Vol: 52.1 ml 27.50 ml/m  AORTIC VALVE AV Vmax:      148.00 cm/s AV Peak Grad: 8.8 mmHg LVOT Vmax:    98.07 cm/s LVOT Vmean:   65.167 cm/s LVOT VTI:     0.154 m MITRAL VALVE MV Area (PHT): 2.50 cm    SHUNTS MV Decel Time: 303 msec    Systemic VTI: 0.15 m MV E velocity: 59.70 cm/s MV A velocity: 84.40 cm/s MV E/A ratio:  0.71 Vinie Maxcy MD Electronically signed by Vinie Maxcy MD Signature Date/Time: 06/09/2024/4:12:17 PM    Final    ECHOCARDIOGRAM COMPLETE Result Date: 06/07/2024    ECHOCARDIOGRAM REPORT   Patient Name:   Ronald Lowe Date of Exam: 06/07/2024 Medical Rec #:  [id number]      Height:       72.0 in Accession #:    [id number]     Weight:       151.7 lb Date of Birth:  09/12/1956      BSA:          1.894 m Patient Age:    67 years       BP:           11/11 mmHg Patient Gender: M              HR:           1 bpm. Exam Location:  Inpatient Procedure: 2D Echo (Both Spectral and Color Flow Doppler were utilized during            procedure). Indications:    stroke  History:        Patient has no prior history of Echocardiogram examinations.  Sonographer:    Koleen Popper Referring Phys: [id number] SRISHTI L BHAGAT IMPRESSIONS  1. Echo not perfomred due to patient non compliance.  2. Left ventricular ejection fraction, by estimation, is Not done%. The left ventricle has not done function. Left ventricular endocardial border not optimally defined to evaluate regional wall motion. The left ventricular internal cavity size was not done. Left ventricular diastolic function could not be evaluated.  3. Right ventricular systolic function was not well visualized. The right ventricular size is not well visualized.  4. The mitral valve was not assessed. No evidence of mitral valve regurgitation.  5. The aortic valve was not assessed. Aortic valve regurgitation is not visualized.  6. Aortic Not imaged. FINDINGS  Left Ventricle: Left  ventricular ejection fraction, by estimation, is Not done%. The left ventricle has not done function. Left ventricular endocardial border not optimally defined to evaluate regional wall motion. Strain was performed and the global longitudinal strain is indeterminate. The left ventricular internal cavity size was not done. Suboptimal image quality limits for assessment of left ventricular hypertrophy. Left ventricular diastolic function could not be evaluated. Right Ventricle: The right ventricular size is not well visualized. Right vetricular wall thickness was not assessed. Right ventricular systolic function was not well visualized. Left Atrium:  Left atrial size was not assessed. Right Atrium: Right atrial size was not assessed. Pericardium: The pericardium was not assessed. Mitral Valve: The mitral valve was not assessed. No evidence of mitral valve regurgitation. Tricuspid Valve: The tricuspid valve is not assessed. Tricuspid valve regurgitation is not demonstrated. Aortic Valve: The aortic valve was not assessed. Aortic valve regurgitation is not visualized. Pulmonic Valve: The pulmonic valve was not assessed. Pulmonic valve regurgitation is not visualized. Aorta: Not imaged and the ascending aorta was not well visualized. IAS/Shunts: The interatrial septum was not assessed. Additional Comments: Echo not perfomred due to patient non compliance. 3D was performed not requiring image post processing on an independent workstation and was indeterminate. Maude Emmer MD Electronically signed by Maude Emmer MD Signature Date/Time: 06/07/2024/3:38:19 PM    Final    DG Shoulder Right Result Date: 06/07/2024 CLINICAL DATA:  Right shoulder pain EXAM: RIGHT SHOULDER - 3 VIEW COMPARISON:  None Available. FINDINGS: There is no evidence of fracture or dislocation. Mild degenerative changes of the glenohumeral joint. Soft tissues are unremarkable. IMPRESSION: No acute fracture or dislocation. Electronically Signed   By:  Limin  Xu M.D.   On: 06/07/2024 14:45   DG Forearm Right Result Date: 06/07/2024 CLINICAL DATA:  144615 Pain 144615 EXAM: RIGHT FOREARM - 2 VIEW COMPARISON:  None Available. FINDINGS: No acute fracture or dislocation. There is no evidence of arthropathy or other focal bone abnormality. Soft tissues are unremarkable. IMPRESSION: No acute fracture or dislocation. Electronically Signed   By: Rogelia Myers M.D.   On: 06/07/2024 10:57   DG Chest Portable 1 View Result Date: 06/07/2024 EXAM: 1 VIEW XRAY OF THE CHEST 06/07/2024 01:01:32 AM COMPARISON: CT chest dated 01/29/2024. CLINICAL HISTORY: Coughing. FINDINGS: LUNGS AND PLEURA: No focal pulmonary opacity. No pulmonary edema. No pleural effusion. No pneumothorax. HEART AND MEDIASTINUM: No acute abnormality of the cardiac and mediastinal silhouettes. Thoracic aortic atherosclerosis. BONES AND SOFT TISSUES: No acute osseous abnormality. IMPRESSION: 1. No acute findings. Electronically signed by: Pinkie Pebbles MD 06/07/2024 01:04 AM EDT RP Workstation: [id number]   MR BRAIN WO CONTRAST Result Date: 06/06/2024 CLINICAL DATA:  Neuro deficit, acute, stroke suspected EXAM: MRI HEAD WITHOUT CONTRAST TECHNIQUE: Multiplanar, multiecho pulse sequences of the brain and surrounding structures were obtained without intravenous contrast. COMPARISON:  Same day CTA head/neck and CT head. FINDINGS: Brain: Multiple acute infarcts in the left MCA and left PCA territories, including the left basal ganglia, left posterior limb of the internal capsule, left anterior insula, overlying left frontal and parietal lobes and the left occipital lobe. Mild associated edema in the anterior insula. No mass effect. No midline shift. Additional moderate patchy T2/FLAIR hyperintensities, compatible with chronic microvascular ischemic disease. No mass lesion or hydrocephalus. No extra-axial fluid collections. Vascular: Better evaluated on same day CTA. Skull and upper cervical spine: Normal  marrow signal. Sinuses/Orbits: Clear sinuses.  No acute orbital findings. Other: No mastoid effusions. IMPRESSION: Multiple acute infarcts in the left MCA and left PCA territories, including the left basal ganglia, left posterior limb of the internal capsule, left anterior insula, overlying left frontal and parietal lobes and the left occipital lobe. No mass effect. Electronically Signed   By: Gilmore GORMAN Molt M.D.   On: 06/06/2024 23:29   CT ANGIO HEAD NECK W WO CM W PERF (CODE STROKE) Result Date: 06/06/2024 CLINICAL DATA:  Neuro deficit, acute, stroke suspected EXAM: CT ANGIOGRAPHY HEAD AND NECK CT PERFUSION BRAIN TECHNIQUE: Multidetector CT imaging of the head and neck was performed using the  standard protocol during bolus administration of intravenous contrast. Multiplanar CT image reconstructions and MIPs were obtained to evaluate the vascular anatomy. Carotid stenosis measurements (when applicable) are obtained utilizing NASCET criteria, using the distal internal carotid diameter as the denominator. Multiphase CT imaging of the brain was performed following IV bolus contrast injection. Subsequent parametric perfusion maps were calculated using RAPID software. RADIATION DOSE REDUCTION: This exam was performed according to the departmental dose-optimization program which includes automated exposure control, adjustment of the mA and/or kV according to patient size and/or use of iterative reconstruction technique. CONTRAST:  OMNIPAQUE  IOHEXOL  350 MG/ML SOLN COMPARISON:  Same day CT head. FINDINGS: CTA NECK FINDINGS Aortic arch: Aortic atherosclerosis. Great vessel origins are patent. Right carotid system: Atherosclerosis at the carotid bifurcation and involving the proximal ICA with approximately 55% stenosis. Left carotid system: Critical, nearly occlusive stenosis at the left carotid bifurcation. The degree of stenosis is difficult to quantify given the degree of bulky calcific atherosclerosis but  likely at least 80%. More distal ICA is patent but diminutive. Vertebral arteries: Right dominant. Mild bilateral vertebral artery origin stenosis. Moderate right V2 vertebral artery stenosis due to compression from adjacent osteophyte. Skeleton: Severe multilevel degenerative change in the cervical spine. No acute abnormality on limited assessment. Other neck: No acute abnormality on limited assessment. Upper chest: Emphysema.  Visualized lung apices are clear. Review of the MIP images confirms the above findings CTA HEAD FINDINGS Anterior circulation: Bilateral intracranial ICAs are patent without hemodynamically significant stenosis. Bilateral M1 MCAs and the ACAs are patent. Hypoplastic left A1 ACA. Occluded proximal left M3 MCA just above the anterior sylvian fissure (for example see series 8, images 112 through 114). Posterior circulation: Bilateral intradural vertebral arteries, basilar artery and bilateral posterior cerebral arteries are patent without proximal hemodynamically significant stenosis. Venous sinuses: As permitted by contrast timing, patent. Review of the MIP images confirms the above findings CT Brain Perfusion Findings: ASPECTS: 9 CBF (<30%) Volume: 0mL Perfusion (Tmax>6.0s) volume: Mismatch Volume: spanning multiple vascular territories bilaterally both infratentorial and supratentorial and therefore likely largely artifactual. Infarction Location:No core infarct identified. IMPRESSION: CTA: 1. Occluded proximal left M3 MCA. 2. Critical, nearly occlusive stenosis at the left carotid bifurcation. The degree of stenosis is difficult to quantify given the degree of bulky calcific atherosclerosis but likely at least 80%. 3. Approximately 55% stenosis of the right ICA. 4. Moderate right V2 vertebral artery stenosis due to compression from adjacent osteophyte. CTP: Nondiagnostic perfusion. Findings discussed with Dr. Jerrie via telephone at 10:30 p.m. Electronically Signed   By:  Gilmore GORMAN Molt M.D.   On: 06/06/2024 22:48   CT HEAD CODE STROKE WO CONTRAST Result Date: 06/06/2024 CLINICAL DATA:  Code stroke.  Neuro deficit, acute, stroke suspected EXAM: CT HEAD WITHOUT CONTRAST TECHNIQUE: Contiguous axial images were obtained from the base of the skull through the vertex without intravenous contrast. RADIATION DOSE REDUCTION: This exam was performed according to the departmental dose-optimization program which includes automated exposure control, adjustment of the mA and/or kV according to patient size and/or use of iterative reconstruction technique. COMPARISON:  None Available. FINDINGS: Brain: No evidence of acute infarction, hemorrhage, hydrocephalus, extra-axial collection or mass lesion/mass effect. Moderate patchy white matter hypodensities, compatible with chronic microvascular ischemic disease. Vascular: No hyperdense vessel identified. Skull: Normal. Negative for fracture or focal lesion. Sinuses/Orbits: Clear sinuses.  No acute orbital findings. Other: No mastoid effusions. ASPECTS [facility] Stroke Program Early CT Score) Total score (0-10 with 10 being normal): 9. IMPRESSION: 1.  Subtle hypoattenuation in the anterior left insula, concerning for acute infarct. ASPECTS 9. 2. Moderate chronic microvascular ischemic disease. Findings discussed with Dr. Jerrie via telephone at 10:30 p.m. Electronically Signed   By: Gilmore GORMAN Molt M.D.   On: 06/06/2024 22:37    Microbiology: Results for orders placed or performed during the hospital encounter of 11/16/21  Resp Panel by RT-PCR (Flu A&B, Covid) Nasopharyngeal Swab     Status: Abnormal   Collection Time: 11/17/21 12:10 AM   Specimen: Nasopharyngeal Swab; Nasopharyngeal(NP) swabs in vial transport medium  Result Value Ref Range Status   SARS Coronavirus 2 by RT PCR NEGATIVE NEGATIVE Final    Comment: (NOTE) SARS-CoV-2 target nucleic acids are NOT DETECTED.  The SARS-CoV-2 RNA is generally detectable in upper  respiratory specimens during the acute phase of infection. The lowest concentration of SARS-CoV-2 viral copies this assay can detect is 138 copies/mL. A negative result does not preclude SARS-Cov-2 infection and should not be used as the sole basis for treatment or other patient management decisions. A negative result may occur with  improper specimen collection/handling, submission of specimen other than nasopharyngeal swab, presence of viral mutation(s) within the areas targeted by this assay, and inadequate number of viral copies(<138 copies/mL). A negative result must be combined with clinical observations, patient history, and epidemiological information. The expected result is Negative.  Fact Sheet for Patients:  BloggerCourse.com  Fact Sheet for Healthcare Providers:  SeriousBroker.it  This test is no t yet approved or cleared by the [place]  FDA and  has been authorized for detection and/or diagnosis of SARS-CoV-2 by FDA under an Emergency Use Authorization (EUA). This EUA will remain  in effect (meaning this test can be used) for the duration of the COVID-19 declaration under Section 564(b)(1) of the Act, 21 U.S.C.section 360bbb-3(b)(1), unless the authorization is terminated  or revoked sooner.       Influenza A by PCR POSITIVE (A) NEGATIVE Final   Influenza B by PCR NEGATIVE NEGATIVE Final    Comment: (NOTE) The Xpert Xpress SARS-CoV-2/FLU/RSV plus assay is intended as an aid in the diagnosis of influenza from Nasopharyngeal swab specimens and should not be used as a sole basis for treatment. Nasal washings and aspirates are unacceptable for Xpert Xpress SARS-CoV-2/FLU/RSV testing.  Fact Sheet for Patients: BloggerCourse.com  Fact Sheet for Healthcare Providers: SeriousBroker.it  This test is not yet approved or cleared by the [place]  FDA and has been  authorized for detection and/or diagnosis of SARS-CoV-2 by FDA under an Emergency Use Authorization (EUA). This EUA will remain in effect (meaning this test can be used) for the duration of the COVID-19 declaration under Section 564(b)(1) of the Act, 21 U.S.C. section 360bbb-3(b)(1), unless the authorization is terminated or revoked.  Performed at [facility], [address] [place], [place] [address]   Blood culture (routine single)     Status: None   Collection Time: 11/17/21 12:30 AM   Specimen: BLOOD  Result Value Ref Range Status   Specimen Description BLOOD LEFT ANTECUBITAL  Final   Special Requests   Final    BOTTLES DRAWN AEROBIC AND ANAEROBIC Blood Culture results may not be optimal due to an inadequate volume of blood received in culture bottles   Culture   Final    NO GROWTH 5 DAYS Performed at [facility], [address], [place], [place] [address]    Report Status 11/22/2021 FINAL  Final    Labs: CBC: Recent Labs  Lab  06/08/24 0653 06/10/24 0456 06/13/24 0423  WBC 9.0 6.1 5.6  HGB 16.8 14.2 15.6  HCT 46.4 39.2 44.9  MCV 103.3* 102.6* 108.2*  PLT 178 193 218   Basic Metabolic Panel: Recent Labs  Lab 06/08/24 0653 06/09/24 0521 06/10/24 0456 06/11/24 0445 06/12/24 0441 06/13/24 0423  NA 139 137 137 135 135 138  K 3.5 3.5 3.3* 3.6 3.9 4.1  CL 106 108 109 105 103 106  CO2 15* 15* 16* 20* 19* 17*  GLUCOSE 75 76 84 96 111* 94  BUN 14 12 12  7* 9 15  CREATININE 1.07 1.04 1.09 0.86 0.83 0.88  CALCIUM  8.9 8.6* 8.7* 8.8* 8.9 8.7*  MG 1.5*  --  1.4* 1.8  --   --   PHOS  --   --  3.2  --   --   --    Liver Function Tests: Recent Labs  Lab 06/08/24 0653 06/09/24 0521 06/10/24 0456  AST 40 36 38  ALT 20 19 20   ALKPHOS 54 52 44  BILITOT 2.2* 1.7* 1.7*  PROT 6.4* 6.4* 5.7*  ALBUMIN 3.7 3.7 3.2*   CBG: No results for input(s): GLUCAP in the last 168 hours.  Discharge time spent: approximately 45 minutes spent on discharge  counseling, evaluation of patient on day of discharge, and coordination of discharge planning with nursing, social work, pharmacy and case management  Signed: Lonni SHAUNNA Dalton, MD [facility] 06/14/2024

## 2024-06-14 NOTE — Progress Notes (Signed)
 Inpatient Rehabilitation Admission Medication Review by a Pharmacist  A complete drug regimen review was completed for this patient to identify any potential clinically significant medication issues.  High Risk Drug Classes Is patient taking? Indication by Medication  Antipsychotic Yes Quetiapine  - agitation   Anticoagulant Yes Lovenox  - VTE ppx  Antibiotic No   Opioid No   Antiplatelet Yes Aspirin , Clopidogrel  - CVA  Hypoglycemics/insulin No   Vasoactive Medication Yes Lisinopril  - HTN  Chemotherapy No   Other Yes Brovana , Pulmicort , Yupelri , prn albuterol  - COPD Folic acid , thiamine , cyanocobalamin - supplement Pantoprazole  - Reflux     Type of Medication Issue Identified Description of Issue Recommendation(s)  Drug Interaction(s) (clinically significant)     Duplicate Therapy     Allergy     No Medication Administration End Date     Incorrect Dose     Additional Drug Therapy Needed     Significant med changes from prior encounter (inform family/care partners about these prior to discharge).    Other       Clinically significant medication issues were identified that warrant physician communication and completion of prescribed/recommended actions by midnight of the next day:  No  Name of provider notified for urgent issues identified:   Provider Method of Notification:     Pharmacist comments: Aspirin  dose to 81 mg per neurology note  Time spent performing this drug regimen review (minutes):  20 minutes  Thank you. Olam Monte, PharmD 06/14/2024 4:00 PM

## 2024-06-14 NOTE — TOC Transition Note (Signed)
 Transition of Care [facility]) - Discharge Note   Patient Details  Name: Ronald Lowe MRN: [id number] Date of Birth: [birth year]  [facility] CM/SW Contact:  Almarie CHRISTELLA Goodie, LCSW Phone Number: 06/14/2024, 11:52 AM   Clinical Narrative:   Patient discharging to CIR, no further acute TOC needs.    Final next level of care: IP Rehab Facility Barriers to Discharge: Barriers Resolved   Patient Goals and CMS Choice   CMS Medicare.gov Compare Post Acute Care list provided to:: Patient Represenative (must comment) Choice offered to / list presented to : Spouse      Discharge Placement                       Discharge Plan and Services Additional resources added to the After Visit Summary for     Discharge Planning Services: CM Consult Post Acute Care Choice: IP Rehab                               Social Drivers of Health (SDOH) Interventions SDOH Screenings   Food Insecurity: No Food Insecurity (09/29/2023)  Housing: Low Risk  (09/29/2023)  Transportation Needs: No Transportation Needs (09/29/2023)  Utilities: Not At Risk (09/29/2023)  Alcohol Screen: Low Risk  (09/29/2023)  Depression (PHQ2-9): Low Risk  (02/22/2024)  Financial Resource Strain: Low Risk  (09/29/2023)  Physical Activity: Inactive (09/29/2023)  Social Connections: Moderately Integrated (09/29/2023)  Stress: No Stress Concern Present (09/29/2023)  Tobacco Use: High Risk (06/07/2024)  Health Literacy: Adequate Health Literacy (09/29/2023)     Readmission Risk Interventions     No data to display

## 2024-06-14 NOTE — Progress Notes (Signed)
 Signed              Physical Medicine and Rehabilitation Consult Reason for Consult:Rehab Referring Physician: Dr. Gretta     HPI: Ronald Lowe is a 68 y.o. male with PMH of polysubstance abuse, atrial fibrillation, hyperlipidemia, COPD, tobacco use, hypertension, A-fib noncompliant with anticoagulation, GERD, CKD, COPD who presented to [facility], ED after he was found confused with right arm weakness.  MRI of the brain showed multiple acute infarcts in left MCA and left PCA territories, neurology suspects likely due to symptomatic proximal left ICA stenosis.  CTA with occluded proximal left M3 MCA, critical stenosis of left carotid bifurcation, stenosis of right ICA, right V2 vertebral artery stenosis due to compression from osteophyte.  Patient had UDS with THC, cocaine, benzos, amphetamines.  He also history of alcohol and tobacco abuse.  Patient is scheduled tentatively for left carotid endarterectomy on Thursday with Dr. [name].  He is on at D2 thin diet for dysphagia.  Seen by OT and PT and found to have functional deficit felt to be candidate for CIR.   Per chart review lives in 1 level home with 2 steps to enter, family able to assist 24 hours/day after discharge.        Home: Home Living Family/patient expects to be discharged to:: Private residence Living Arrangements: Spouse/significant other Available Help at Discharge: Family, Available 24 hours/day Type of Home: House Home Access: Stairs to enter [employer] of Steps: 2 Entrance Stairs-Rails: Right, Left Home Layout: One level Bathroom Shower/Tub: Engineer, manufacturing systems: Standard Bathroom Accessibility: Yes Home Equipment: None Additional Comments: Pt unable to provide hx. Most info obtained from previous admission (2022).  Lives With: Spouse  Functional History: Prior Function Prior Level of Function : Independent/Modified Independent, Patient poor historian/Family not available,  Working/employed Mobility Comments: 3 falls Functional Status:  Mobility: Bed Mobility Overal bed mobility: Needs Assistance Bed Mobility: Supine to Sit, Sit to Supine Supine to sit: Mod assist, +2 for safety/equipment, +2 for physical assistance Sit to supine: Mod assist, +2 for physical assistance, +2 for safety/equipment General bed mobility comments: assist for BLE advancement and trunk management. increased cues and time Transfers Overall transfer level: Needs assistance Equipment used: Rolling walker (2 wheels) Transfers: Sit to/from Stand Sit to Stand: Mod assist, +2 physical assistance, +2 safety/equipment General transfer comment: From EOB with mod A +2 for power up and balance due to R lateral lean. Pt able to take a few side steps towards HOB with max A +2 for weight shifting and advancing feet. Pt more pivoting feet rather than clearing. Increased R lean with prolonged standing and stepping. Ambulation/Gait General Gait Details: unable to progress   ADL: ADL Overall ADL's : Needs assistance/impaired Eating/Feeding: NPO Grooming: Moderate assistance Grooming Details (indicate cue type and reason): unable to bring hand all the way to his face Upper Body Bathing: Cueing for safety Lower Body Bathing: Total assistance, +2 for physical assistance, +2 for safety/equipment Upper Body Dressing : Maximal assistance Lower Body Dressing: +2 for physical assistance, Total assistance, +2 for safety/equipment Toilet Transfer: Maximal assistance, +2 for physical assistance, +2 for safety/equipment Toilet Transfer Details (indicate cue type and reason): simualated at the EOB Toileting- Clothing Manipulation and Hygiene: Total assistance, +2 for physical assistance, +2 for safety/equipment Functional mobility during ADLs: Moderate assistance, +2 for physical assistance, +2 for safety/equipment, Rolling walker (2 wheels) General ADL Comments: limited by impiared cognition, midline  awareness and weakness. session focused on OOB transfers and  midline posture   Cognition: Cognition Overall Cognitive Status: No family/caregiver present to determine baseline cognitive functioning Arousal/Alertness: Awake/alert Orientation Level: Oriented to person, Disoriented to place, Disoriented to time, Disoriented to situation Attention: Sustained Sustained Attention: Impaired Sustained Attention Impairment: Verbal basic, Functional basic Memory: Impaired Memory Impairment: Decreased recall of new information Awareness: Impaired Awareness Impairment: Intellectual impairment Problem Solving: Impaired Problem Solving Impairment: Functional basic Cognition Arousal: Lethargic Behavior During Therapy: Restless Overall Cognitive Status: No family/caregiver present to determine baseline cognitive functioning     Review of Systems  Constitutional:  Negative for fever.  HENT:  Negative for congestion.   Eyes:  Positive for blurred vision. Negative for double vision.  Respiratory:  Negative for shortness of breath.   Cardiovascular:  Negative for chest pain.  Gastrointestinal:  Positive for constipation. Negative for abdominal pain, diarrhea, nausea and vomiting.  Genitourinary: Negative.   Musculoskeletal:  Negative for back pain and joint pain.  Neurological:  Positive for speech change. Negative for sensory change and headaches.  Psychiatric/Behavioral:  The patient does not have insomnia.               Past Medical History:  Diagnosis Date   Aortic atherosclerosis (HCC)     Cervical spinal stenosis     Chronic kidney disease     COPD (chronic obstructive pulmonary disease) (HCC)     Coronary artery calcification seen on CT scan     ETOH abuse      a.) 28+ standard drinks/week   GERD (gastroesophageal reflux disease)     History of amiodarone  therapy      a.) in the past; self discontinued   History of medication noncompliance      a.) reported 10/2022 that he has  been off NOAC for several months secondary to vertiginous symptoms, not acting like himself, and depression; b.) off/not taking as prescribed: antiarrhythmics, antihypertensives, statins, and MDIs   Hyperlipidemia     Hypertension     Long term current use of anticoagulant      a.) rivaroxaban    PAF (paroxysmal atrial fibrillation) (HCC)      a.) CHA2DS2VASc = 3 (age, HTN, vascular disease history);  b.) rate/rhythm maintained on oral carvediolol (self discontinued diltiazem  + amiodarone  + metoprolol ); chronically anticoagulated with rivaroxaban  (switched from apixaban  11/17/2022)   Tobacco abuse                        Past Surgical History:  Procedure Laterality Date   SHOULDER ARTHROSCOPY WITH SUBACROMIAL DECOMPRESSION, ROTATOR CUFF REPAIR AND BICEP TENDON REPAIR Left 12/07/2022    Procedure: SHOULDER ARTHROSCOPY WITH DEBRIDEMENT, DECOMPRESSION, ROTATOR CUFF REPAIR AND BICEPS TENODESIS.- RNFA;  Surgeon: Edie Norleen PARAS, MD;  Location: [facility] OR[facility];  Service: Orthopedics;  Laterality: Left;   SHOULDER ARTHROSCOPY WITH SUBACROMIAL DECOMPRESSION, ROTATOR CUFF REPAIR AND BICEP TENDON REPAIR Left 02/02/2023    Procedure: LEFT SHOULDER ARTHROSCOPY WITH DEBRIDEMENT AND REPAIR OF RECURRENT ROTATOR CUFF TEAR OF LEFT SHOULDER;  Surgeon: Edie Norleen PARAS, MD;  Location: [facility];  Service: Orthopedics;  Laterality: Left;   TONSILLECTOMY                          Family History  Problem Relation Age of Onset   Emphysema Father     CAD Father          CABG   Stroke Mother     CAD Brother 2  CABG   Hypertension Brother            Social History:  reports that he has been smoking cigarettes. He started smoking about 53 years ago. He has a 13.4 pack-year smoking history. He has quit using smokeless tobacco. He reports current alcohol use of about 28.0 standard drinks of alcohol per week. He reports current drug use. Drug: Marijuana. Allergies:  Allergies  No Known Allergies                   Medications Prior to Admission  Medication Sig Dispense Refill   albuterol  (VENTOLIN  HFA) 108 (90 Base) MCG/ACT inhaler INHALE 2 PUFFS BY MOUTH EVERY 4 HOURS AS NEEDED FOR WHEEZE OR FOR SHORTNESS OF BREATH 18 each 5   famotidine  (PEPCID ) 40 MG tablet TAKE 1 TABLET EVERY DAY 90 tablet 3   hydrOXYzine  (VISTARIL ) 25 MG capsule TAKE 1 CAPSULE (25 MG TOTAL) BY MOUTH EVERY 6 (SIX) HOURS AS NEEDED. 360 capsule 1   TRELEGY ELLIPTA  100-62.5-25 MCG/ACT AEPB INHALE 1 PUFF EVERY DAY 180 each 0   lisinopril  (ZESTRIL ) 5 MG tablet TAKE 1 TABLET EVERY DAY (Patient not taking: Reported on 06/07/2024) 90 tablet 3              Blood pressure (!) 139/90, pulse 89, temperature 98.7 F (37.1 C), resp. rate 20, height 6' (1.829 m), weight 68.8 kg, SpO2 100%. Physical Exam   General: No apparent distress, chronically ill appearing, in restraints HEENT: Head is normocephalic, atraumatic, mucus membranes dry Neck: Supple without JVD or lymphadenopathy Heart: Reg rate and rhythm. No murmurs rubs or gallops Chest: CTA bilaterally without wheezes, rales, or rhonchi; no distress Abdomen: Soft, non-tender, non-distended, bowel sounds positive. Extremities: No clubbing, cyanosis, or edema. Pulses are 2+ Psych: Pt's affect is appropriate. Pt is cooperative Skin: Clean and intact without signs of breakdown Neuro:     Mental Status: Alert and awake, alert to person and situation, hospital not date, able to tell me his age, confused, often calling for his wife, poor safety awareness  Speech/Languate: Names 3/4 items, able to repeat partially,  following simple commands, + dysarthria CRANIAL NERVES: II: PERRL. Visual fields difficult to assess III, IV, VI: EOM intact, no gaze preference or deviation V: normal sensation bilaterally VII: mild R facial weakness VIII: normal hearing to speech IX, X: normal palatal elevation XI: 5/5 head turn and 5/5 shoulder shrug bilaterally XII: Tongue midline     MOTOR: RUE: 3/5  Deltoid, 4-/5 Biceps, 3/5 Triceps,4/5 Grip LUE: 4/5 Deltoid, 4+/5 Biceps, 4+/5 Triceps, 4+/5 Grip RLE: HF 4/5, KE 4+/5, ADF 4-/5, APF 4/5 LLE: HF 4+/5, KE 4+/5, ADF 4/5, APF 4+/5   SENSORY: Normal to touch all 4 extremities   Coordination: Normal finger to nose slow right more than left       Lab Results Last 24 Hours           Results for orders placed or performed during the hospital encounter of 06/06/24 (from the past 24 hours)  Basic metabolic panel with GFR     Status: Abnormal    Collection Time: 06/11/24  4:45 AM  Result Value Ref Range    Sodium 135 135 - 145 mmol/L    Potassium 3.6 3.5 - 5.1 mmol/L    Chloride 105 98 - 111 mmol/L    CO2 20 (L) 22 - 32 mmol/L    Glucose, Bld 96 70 - 99 mg/dL    BUN 7 (L) 8 -  23 mg/dL    Creatinine, Ser 9.13 0.61 - 1.24 mg/dL    Calcium  8.8 (L) 8.9 - 10.3 mg/dL    GFR, Estimated >39 >39 mL/min    Anion gap 10 5 - 15  Magnesium      Status: None    Collection Time: 06/11/24  4:45 AM  Result Value Ref Range    Magnesium  1.8 1.7 - 2.4 mg/dL        Imaging Results (Last 48 hours)  VAS US  UPPER EXTREMITY VENOUS DUPLEX Result Date: 06/10/2024 UPPER VENOUS STUDY  Patient Name:  CLARA SMOLEN  Date of Exam:   06/09/2024 Medical Rec #: [id number]       Accession #:    [id number] Date of Birth: [birth year]       Patient Gender: M Patient Age:   55 years Exam Location:  [facility] Procedure:      VAS US  UPPER EXTREMITY VENOUS DUPLEX Referring Phys: ARY XU --------------------------------------------------------------------------------  Indications: Swelling Risk Factors: None identified. Comparison Study: No prior studies. Performing Technologist: Cordella Collet RVT  Examination Guidelines: A complete evaluation includes B-mode imaging, spectral Doppler, color Doppler, and power Doppler as needed of all accessible portions of each vessel. Bilateral testing is considered an integral part of a complete examination. Limited examinations for  reoccurring indications may be performed as noted.  Right Findings: +----------+------------+---------+-----------+----------+-------+ RIGHT     CompressiblePhasicitySpontaneousPropertiesSummary +----------+------------+---------+-----------+----------+-------+ IJV           Full       Yes       Yes                      +----------+------------+---------+-----------+----------+-------+ Subclavian               Yes       Yes                      +----------+------------+---------+-----------+----------+-------+ Axillary      Full       Yes       Yes                      +----------+------------+---------+-----------+----------+-------+ Brachial      Full                                          +----------+------------+---------+-----------+----------+-------+ Radial        Full                                          +----------+------------+---------+-----------+----------+-------+ Ulnar         Full                                          +----------+------------+---------+-----------+----------+-------+ Cephalic      Full                                          +----------+------------+---------+-----------+----------+-------+ Basilic       Full                                          +----------+------------+---------+-----------+----------+-------+  Left Findings: +----------+------------+---------+-----------+----------+-------+ LEFT      CompressiblePhasicitySpontaneousPropertiesSummary +----------+------------+---------+-----------+----------+-------+ Subclavian               Yes       Yes                      +----------+------------+---------+-----------+----------+-------+  Summary:  Right: No evidence of deep vein thrombosis in the upper extremity. No evidence of superficial vein thrombosis in the upper extremity.  Left: No evidence of thrombosis in the subclavian.  *See table(s) above for measurements and observations.   Diagnosing physician: Lonni Gaskins MD Electronically signed by Lonni Gaskins MD on 06/10/2024 at 8:20:12 AM.    Final     ECHOCARDIOGRAM COMPLETE Result Date: 06/09/2024    ECHOCARDIOGRAM REPORT   Patient Name:   AUGUSTE TEBBETTS Date of Exam: 06/09/2024 Medical Rec #:  [id number]      Height:       72.0 in Accession #:    [id number]     Weight:       151.7 lb Date of Birth:  1956/04/17      BSA:          1.894 m Patient Age:    67 years       BP:           98/73 mmHg Patient Gender: M              HR:           85 bpm. Exam Location:  Inpatient Procedure: 2D Echo, Cardiac Doppler and Color Doppler (Both Spectral and Color            Flow Doppler were utilized during procedure). Indications:    Stroke I63.9  History:        Patient has no prior history of Echocardiogram examinations.                 Stroke and COPD, Arrythmias:Atrial Fibrillation,                 Signs/Symptoms:Hypotension; Risk Factors:Hypertension,                 Dyslipidemia and Current Smoker.  Sonographer:    Thea Norlander RCS Referring Phys: ERIN C LEHNER  Sonographer Comments: Image acquisition challenging due to uncooperative patient. IMPRESSIONS  1. Left ventricular ejection fraction, by estimation, is 60 to 65%. The left ventricle has normal function. The left ventricle has no regional wall motion abnormalities. There is severe asymmetric left ventricular hypertrophy of the septal segment. Left  ventricular diastolic parameters are consistent with Grade I diastolic dysfunction (impaired relaxation).  2. Right ventricular systolic function is normal. The right ventricular size is normal.  3. A small pericardial effusion is present. The pericardial effusion is localized near the right ventricle. There is no evidence of cardiac tamponade.  4. The mitral valve is abnormal. Trivial mitral valve regurgitation.  5. The aortic valve was not well visualized. Aortic valve regurgitation is not visualized. Aortic valve  sclerosis/calcification is present, without any evidence of aortic stenosis. Comparison(s): No prior Echocardiogram. FINDINGS  Left Ventricle: Left ventricular ejection fraction, by estimation, is 60 to 65%. The left ventricle has normal function. The left ventricle has no regional wall motion abnormalities. The left ventricular internal cavity size was normal in size. There is  severe asymmetric left ventricular hypertrophy of the septal segment. Left ventricular diastolic parameters are consistent with Grade I diastolic dysfunction (impaired relaxation). Indeterminate filling pressures.  Right Ventricle: The right ventricular size is normal. No increase in right ventricular wall thickness. Right ventricular systolic function is normal. Left Atrium: Left atrial size was normal in size. Right Atrium: Right atrial size was normal in size. Pericardium: A small pericardial effusion is present. The pericardial effusion is localized near the right ventricle. There is no evidence of cardiac tamponade. Mitral Valve: The mitral valve is abnormal. Mild mitral annular calcification. Trivial mitral valve regurgitation. Tricuspid Valve: The tricuspid valve is not well visualized. Tricuspid valve regurgitation is not demonstrated. Aortic Valve: The aortic valve was not well visualized. Aortic valve regurgitation is not visualized. Aortic valve sclerosis/calcification is present, without any evidence of aortic stenosis. Aortic valve peak gradient measures 8.8 mmHg. Pulmonic Valve: The pulmonic valve was not well visualized. Pulmonic valve regurgitation is not visualized. Aorta: The aortic root and ascending aorta are structurally normal, with no evidence of dilitation. IAS/Shunts: The interatrial septum was not well visualized.  LEFT VENTRICLE PLAX 2D LVIDd:         3.00 cm Diastology LVIDs:         2.10 cm LV e' medial:    5.00 cm/s LV PW:         1.10 cm LV E/e' medial:  11.9 LV IVS:        1.70 cm LV e' lateral:   8.49 cm/s                         LV E/e' lateral: 7.0  RIGHT VENTRICLE RV S prime:     18.60 cm/s TAPSE (M-mode): 2.7 cm LEFT ATRIUM             Index        RIGHT ATRIUM           Index LA diam:        3.60 cm 1.90 cm/m   RA Area:     13.30 cm LA Vol (A2C):   43.8 ml 23.12 ml/m  RA Volume:   26.30 ml  13.88 ml/m LA Vol (A4C):   54.0 ml 28.51 ml/m LA Biplane Vol: 52.1 ml 27.50 ml/m  AORTIC VALVE AV Vmax:      148.00 cm/s AV Peak Grad: 8.8 mmHg LVOT Vmax:    98.07 cm/s LVOT Vmean:   65.167 cm/s LVOT VTI:     0.154 m MITRAL VALVE MV Area (PHT): 2.50 cm    SHUNTS MV Decel Time: 303 msec    Systemic VTI: 0.15 m MV E velocity: 59.70 cm/s MV A velocity: 84.40 cm/s MV E/A ratio:  0.71 Vinie Maxcy MD Electronically signed by Vinie Maxcy MD Signature Date/Time: 06/09/2024/4:12:17 PM    Final     ECHOCARDIOGRAM COMPLETE Result Date: 06/07/2024    ECHOCARDIOGRAM REPORT   Patient Name:   JUBAL RADEMAKER Date of Exam: 06/07/2024 Medical Rec #:  [id number]      Height:       72.0 in Accession #:    [id number]     Weight:       151.7 lb Date of Birth:  January 06, 1956      BSA:          1.894 m Patient Age:    67 years       BP:           11/11 mmHg Patient Gender: M              HR:  1 bpm. Exam Location:  Inpatient Procedure: 2D Echo (Both Spectral and Color Flow Doppler were utilized during            procedure). Indications:    stroke  History:        Patient has no prior history of Echocardiogram examinations.  Sonographer:    Koleen Popper Referring Phys: [id number] SRISHTI L BHAGAT IMPRESSIONS  1. Echo not perfomred due to patient non compliance.  2. Left ventricular ejection fraction, by estimation, is Not done%. The left ventricle has not done function. Left ventricular endocardial border not optimally defined to evaluate regional wall motion. The left ventricular internal cavity size was not done. Left ventricular diastolic function could not be evaluated.  3. Right ventricular systolic function was not well visualized.  The right ventricular size is not well visualized.  4. The mitral valve was not assessed. No evidence of mitral valve regurgitation.  5. The aortic valve was not assessed. Aortic valve regurgitation is not visualized.  6. Aortic Not imaged. FINDINGS  Left Ventricle: Left ventricular ejection fraction, by estimation, is Not done%. The left ventricle has not done function. Left ventricular endocardial border not optimally defined to evaluate regional wall motion. Strain was performed and the global longitudinal strain is indeterminate. The left ventricular internal cavity size was not done. Suboptimal image quality limits for assessment of left ventricular hypertrophy. Left ventricular diastolic function could not be evaluated. Right Ventricle: The right ventricular size is not well visualized. Right vetricular wall thickness was not assessed. Right ventricular systolic function was not well visualized. Left Atrium: Left atrial size was not assessed. Right Atrium: Right atrial size was not assessed. Pericardium: The pericardium was not assessed. Mitral Valve: The mitral valve was not assessed. No evidence of mitral valve regurgitation. Tricuspid Valve: The tricuspid valve is not assessed. Tricuspid valve regurgitation is not demonstrated. Aortic Valve: The aortic valve was not assessed. Aortic valve regurgitation is not visualized. Pulmonic Valve: The pulmonic valve was not assessed. Pulmonic valve regurgitation is not visualized. Aorta: Not imaged and the ascending aorta was not well visualized. IAS/Shunts: The interatrial septum was not assessed. Additional Comments: Echo not perfomred due to patient non compliance. 3D was performed not requiring image post processing on an independent workstation and was indeterminate. Maude Emmer MD Electronically signed by Maude Emmer MD Signature Date/Time: 06/07/2024/3:38:19 PM    Final        Assessment/Plan: Diagnosis: L MCA, L PCA scattered infracts Does the need for  close, 24 hr/day medical supervision in concert with the patient's rehab needs make it unreasonable for this patient to be served in a less intensive setting? Yes Co-Morbidities requiring supervision/potential complications:  -Carotid stenosis, Afib, HTN, polysubstance abuse, Dysphagia, Medication noncompliance, HLD, Tobacco use Due to bladder management, bowel management, safety, skin/wound care, disease management, medication administration, pain management, and patient education, does the patient require 24 hr/day rehab nursing? Yes Does the patient require coordinated care of a physician, rehab nurse, therapy disciplines of PT/OT/SLP to address physical and functional deficits in the context of the above medical diagnosis(es)? Yes Addressing deficits in the following areas: balance, endurance, locomotion, strength, transferring, bowel/bladder control, bathing, dressing, feeding, grooming, toileting, cognition, speech, language, swallowing, and psychosocial support Can the patient actively participate in an intensive therapy program of at least 3 hrs of therapy per day at least 5 days per week? Yes The potential for patient to make measurable gains while on inpatient rehab is excellent Anticipated functional outcomes upon discharge from inpatient  rehab are supervision and min assist  with PT, supervision and min assist with OT, supervision and min assist with SLP. Estimated rehab length of stay to reach the above functional goals is: 10-14 Anticipated discharge destination: Home Overall Rehab/Functional Prognosis: good   POST ACUTE RECOMMENDATIONS: This patient's condition is appropriate for continued rehabilitative care in the following setting: CIR Patient has agreed to participate in recommended program. Yes Note that insurance prior authorization may be required for reimbursement for recommended care.   Comment: Pt would be candidate for CIR when medically stable.  Currently there is plan  for tenative L carotid endarterectomy on Thursday. Would like to confirm disposition plan/family support.      I have personally performed a face to face diagnostic evaluation of this patient. Additionally, I have examined the patient's medical record including any pertinent labs and radiographic images.

## 2024-06-14 NOTE — Progress Notes (Signed)
 This RN called spouse to notify of patient arrival on unit and need for enclosure bed; all questions answered.

## 2024-06-14 NOTE — Progress Notes (Signed)
 Patient discharged to [facility], 4W room #10

## 2024-06-15 DIAGNOSIS — I63512 Cerebral infarction due to unspecified occlusion or stenosis of left middle cerebral artery: Secondary | ICD-10-CM | POA: Diagnosis not present

## 2024-06-15 DIAGNOSIS — I10 Essential (primary) hypertension: Secondary | ICD-10-CM

## 2024-06-15 MED ORDER — THIAMINE MONONITRATE 100 MG PO TABS
100.0000 mg | ORAL_TABLET | Freq: Every day | ORAL | Status: DC
  Administered 2024-06-16 – 2024-06-25 (×10): 100 mg via ORAL
  Filled 2024-06-15 (×10): qty 1

## 2024-06-15 NOTE — Plan of Care (Deleted)
  Problem: RH Cognition - SLP Goal: RH LTG Patient will demonstrate orientation with cues Description:  LTG:  Patient will demonstrate orientation to person/place/time/situation with cues (SLP)   Flowsheets (Taken 06/15/2024 1536) LTG Patient will demonstrate orientation to:  Person  Place  Time  Situation LTG: Patient will demonstrate orientation using cueing (SLP): Minimal Assistance - Patient > 75%   Problem: RH Comprehension Communication Goal: LTG Patient will comprehend basic/complex auditory (SLP) Description: LTG: Patient will comprehend basic/complex auditory information with cues (SLP). Flowsheets (Taken 06/15/2024 1536) LTG: Patient will comprehend: Basic auditory information LTG: Patient will comprehend auditory information with cueing (SLP): Minimal Assistance - Patient > 75%   Problem: RH Expression Communication Goal: LTG Patient will increase speech intelligibility (SLP) Description: LTG: Patient will increase speech intelligibility at word/phrase/conversation level with cues, % of the time (SLP) Flowsheets (Taken 06/15/2024 1536) LTG: Patient will increase speech intelligibility (SLP): Supervision Level: Conversation level   Problem: RH Memory Goal: LTG Patient will use memory compensatory aids to (SLP) Description: LTG:  Patient will use memory compensatory aids to recall biographical/new, daily complex information with cues (SLP) Flowsheets (Taken 06/15/2024 1536) LTG: Patient will use memory compensatory aids to (SLP): Moderate Assistance - Patient 50 - 74%   Problem: RH Attention Goal: LTG Patient will demonstrate this level of attention during functional activites (SLP) Description: LTG:  Patient will will demonstrate this level of attention during functional activites (SLP) Flowsheets (Taken 06/15/2024 1536) Patient will demonstrate during cognitive/linguistic activities the attention type of: Sustained Patient will demonstrate this level of attention during  cognitive/linguistic activities in: Controlled LTG: Patient will demonstrate this level of attention during cognitive/linguistic activities with assistance of (SLP): Minimal Assistance - Patient > 75%   Problem: RH Awareness Goal: LTG: Patient will demonstrate awareness during functional activites type of (SLP) Description: LTG: Patient will demonstrate awareness during functional activites type of (SLP) Flowsheets (Taken 06/15/2024 1536) Patient will demonstrate during cognitive/linguistic activities awareness type of: Emergent LTG: Patient will demonstrate awareness during cognitive/linguistic activities with assistance of (SLP): Supervision

## 2024-06-15 NOTE — Progress Notes (Signed)
 PROGRESS NOTE   Subjective/Complaints:  Pt doing well, slept well. Pain managed overall. LBM a couple days ago per pt, but per documentation he may have had some overnight? Documented as bowel incontinence but nothing more about the BM is written-- last documented BM with characteristics is 06/12/24. Urinary incontinence reported. No other complaints or concerns but pt cognition limits history.   ROS: limited by cognition/speech.   Objective:   No results found. Recent Labs    06/13/24 0423  WBC 5.6  HGB 15.6  HCT 44.9  PLT 218   Recent Labs    06/13/24 0423  NA 138  K 4.1  CL 106  CO2 17*  GLUCOSE 94  BUN 15  CREATININE 0.88  CALCIUM  8.7*        Intake/Output Summary (Last 24 hours) at 06/15/2024 1224 Last data filed at 06/14/2024 1904 Gross per 24 hour  Intake 236 ml  Output --  Net 236 ml        Physical Exam: Vital Signs Blood pressure (!) 146/98, pulse 87, temperature (!) 97.4 F (36.3 C), temperature source Oral, resp. rate 18, weight 64.8 kg, SpO2 100%.  Constitution: Appropriate appearance for age. No apparent distress, resting in enclosure bed but SLP at bedside.  Resp: No respiratory distress. No accessory muscle usage. on RA and CTAB Cardio: Well perfused appearance. No peripheral edema. RRR, no m/r/g Abdomen: Nondistended. Nontender.  Soft, +BS throughout Psych: Appropriate mood and affect, though difficult to understand.  Skin: PIV intact LUE Neuro: dysarthria noted  PRIOR EXAMS:  Neuro: AAOx3; not time with cues + concentration and memory deficits Can spell world backwards, cannot add change. Cannot compare objects. dysarthria; mostly fluent speech.    DTRs: Reflexes were 2+ in bilateral achilles, patella, biceps, BR and triceps. Babinsky: flexor responses b/l.   Hoffmans: negative b/l Sensory exam: revealed normal sensation in all dermatomal regions in bilateral upper  extremities and bilateral lower extremities Motor exam: Poor participation; grossly 4+/5 RUE and RLE; 5/5 LUE and LLE Coordination: Fine motor coordination was normal.         Neurological:     Comments: Patient is alert and makes eye contact with examiner.  Speech is dysarthric.  He was able to answer some basic questions with some delay in processing and very limited medical historian.  He was able to name 2/4 and able to repeat with dysarthric speech.   Assessment/Plan: 1. Functional deficits which require 3+ hours per day of interdisciplinary therapy in a comprehensive inpatient rehab setting. Physiatrist is providing close team supervision and 24 hour management of active medical problems listed below. Physiatrist and rehab team continue to assess barriers to discharge/monitor patient progress toward functional and medical goals  Care Tool:  Bathing    Body parts bathed by patient: Face, Right arm   Body parts bathed by helper: Right arm, Left arm, Face, Left lower leg, Right lower leg, Left upper leg, Right upper leg, Buttocks, Front perineal area, Abdomen, Chest     Bathing assist Assist Level: Total Assistance - Patient < 25%     Upper Body Dressing/Undressing Upper body dressing   What is the patient wearing?: Pull over  shirt    Upper body assist Assist Level: Maximal Assistance - Patient 25 - 49%    Lower Body Dressing/Undressing Lower body dressing      What is the patient wearing?: Pants, Underwear/pull up     Lower body assist Assist for lower body dressing: Total Assistance - Patient < 25%     Toileting Toileting    Toileting assist Assist for toileting: Total Assistance - Patient < 25%     Transfers Chair/bed transfer  Transfers assist     Chair/bed transfer assist level: 2 Helpers (+2 HHA min-mod A)     Locomotion Ambulation   Ambulation assist              Walk 10 feet activity   Assist           Walk 50 feet  activity   Assist           Walk 150 feet activity   Assist           Walk 10 feet on uneven surface  activity   Assist           Wheelchair     Assist               Wheelchair 50 feet with 2 turns activity    Assist            Wheelchair 150 feet activity     Assist          Blood pressure (!) 146/98, pulse 87, temperature (!) 97.4 F (36.3 C), temperature source Oral, resp. rate 18, weight 64.8 kg, SpO2 100%.  Medical Problem List and Plan: 1. Functional deficits secondary to left MCA scattered infarct likely etiology due to symptomatic proximal ICA stenosis             -patient may  shower             -ELOS/Goals: 14-16 days, Min A PT/OT/SLP             -Continue CIR   2.  Antithrombotics: -DVT/anticoagulation:  Pharmaceutical: Lovenox  40mg  daily  -antiplatelet therapy: Aspirin  81 mg daily and Plavix  75 mg daily until after left carotid surgery  3. Pain Management: Tylenol  as needed 4. Mood/Behavior/Sleep/alcohol withdrawal/delirium: Provide emotional support             -antipsychotic agents: seroquel  25 mg at bedtime + BID PRN              - Veil bed for high fall risk, attempting OOB, confusion              - delirium precautions              - add sleep log, trazodone 50 mg at bedtime PRN for sleep   5. Neuropsych/cognition: This patient is not capable of making decisions on his own behalf. 6. Skin/Wound Care: Routine skin checks 7. Fluids/Electrolytes/Nutrition: Routine in and outs with follow-up chemistries -Continue vitamins and supplements 8.  Dysphagia.  Dysphagia #2 thin liquids.  Follow-up speech therapy 9.  COPD/tobacco use.  Continue inhalers.  NicoDerm patch.  Provide counseling 10.  PAF.  Noncompliant anticoagulation.  Cardiac rate controlled 11.  History of alcohol use.  Monitor for any signs of withdrawal.  Provide counseling -06/15/24 pt on IV thiamine -- weekday team to address if this can be switched to PO  at this point. Will leave for now since this was part of his delirium tx  12.  AKI/CKD.  Follow-up chemistries Monday 13.  GERD.  Protonix  40mg  daily 14.  Hypertension.  Patient on lisinopril  5 mg day prior to admission but noncompliant.  Monitor with increased mobility  -06/15/24 BP fine until this morning-- monitor for trend Vitals:   06/14/24 1555 06/14/24 2044 06/15/24 0442  BP: 111/75 128/86 (!) 146/98    15.  Symptomatic left ICA stenosis.  Follow-up outpatient vascular surgery Dr. [name] 16. Bowel management: pt unsure of LBM but seems to have documented incontinence but no description of BMs so unclear if this is an error-- last actual documented BM was 6/25.  -06/15/24 for now, leave meds off, but will ask nursing to please document his BMs    LOS: 1 days A FACE TO [facility] EVALUATION WAS PERFORMED  [address] 06/15/2024, 12:24 PM

## 2024-06-15 NOTE — Discharge Summary (Signed)
 Physician Discharge Summary  Patient ID: Ronald Lowe MRN: [id number] DOB/AGE: 06-11-1956 68 y.o.  Admit date: 06/14/2024 Discharge date: 06/25/2024  Discharge Diagnoses:  Principal Problem:   Left middle cerebral artery stroke [facility]) DVT prophylaxis Mood stabilization Dysphagia COPD/tobacco use PAF History of alcohol use as well as marijuana AKI/CKD GERD Hypertension Symptomatic left ICA stenosis Hyperlipidemia Hepatitis C  Discharged Condition: Stable  Significant Diagnostic Studies: VAS US  UPPER EXTREMITY VENOUS DUPLEX Result Date: 06/10/2024 UPPER VENOUS STUDY  Patient Name:  Ronald Lowe  Date of Exam:   06/09/2024 Medical Rec #: [id number]       Accession #:    [id number] Date of Birth: June 21, 1956       Patient Gender: M Patient Age:   19 years Exam Location:  [facility] Procedure:      VAS US  UPPER EXTREMITY VENOUS DUPLEX Referring Phys: ARY XU --------------------------------------------------------------------------------  Indications: Swelling Risk Factors: None identified. Comparison Study: No prior studies. Performing Technologist: Cordella Collet RVT  Examination Guidelines: A complete evaluation includes B-mode imaging, spectral Doppler, color Doppler, and power Doppler as needed of all accessible portions of each vessel. Bilateral testing is considered an integral part of a complete examination. Limited examinations for reoccurring indications may be performed as noted.  Right Findings: +----------+------------+---------+-----------+----------+-------+ RIGHT     CompressiblePhasicitySpontaneousPropertiesSummary +----------+------------+---------+-----------+----------+-------+ IJV           Full       Yes       Yes                      +----------+------------+---------+-----------+----------+-------+ Subclavian               Yes       Yes                      +----------+------------+---------+-----------+----------+-------+ Axillary       Full       Yes       Yes                      +----------+------------+---------+-----------+----------+-------+ Brachial      Full                                          +----------+------------+---------+-----------+----------+-------+ Radial        Full                                          +----------+------------+---------+-----------+----------+-------+ Ulnar         Full                                          +----------+------------+---------+-----------+----------+-------+ Cephalic      Full                                          +----------+------------+---------+-----------+----------+-------+ Basilic       Full                                          +----------+------------+---------+-----------+----------+-------+  Left Findings: +----------+------------+---------+-----------+----------+-------+ LEFT      CompressiblePhasicitySpontaneousPropertiesSummary +----------+------------+---------+-----------+----------+-------+ Subclavian               Yes       Yes                      +----------+------------+---------+-----------+----------+-------+  Summary:  Right: No evidence of deep vein thrombosis in the upper extremity. No evidence of superficial vein thrombosis in the upper extremity.  Left: No evidence of thrombosis in the subclavian.  *See table(s) above for measurements and observations.  Diagnosing physician: Lonni Gaskins MD Electronically signed by Lonni Gaskins MD on 06/10/2024 at 8:20:12 AM.    Final    VAS US  CAROTID Result Date: 06/10/2024 Carotid Arterial Duplex Study Patient Name:  Ronald Lowe  Date of Exam:   06/07/2024 Medical Rec #: [id number]       Accession #:    [id number] Date of Birth: 09/13/1956       Patient Gender: M Patient Age:   63 years Exam Location:  [facility] Procedure:      VAS US  CAROTID Referring Phys: PRAMOD SETHI  --------------------------------------------------------------------------------  Indications:       Carotid artery disease. Risk Factors:      Hypertension, hyperlipidemia, current smoker, coronary artery                    disease, PAD. Other Factors:     GERD, COPD, EtOH, CKD. Comparison Study:  CT Angio neck - Right ICA approximately 55% stenosis.                    Left ICA, nearly occlusive stenosis at the left carotid                    bifurcation with bulky calcific artherosclerosis. Performing Technologist: Ricka Sturdivant-Jones RDMS, RVT  Examination Guidelines: A complete evaluation includes B-mode imaging, spectral Doppler, color Doppler, and power Doppler as needed of all accessible portions of each vessel. Bilateral testing is considered an integral part of a complete examination. Limited examinations for reoccurring indications may be performed as noted.  Right Carotid Findings: +----------+--------+--------+--------+------------------+---------+           PSV cm/sEDV cm/sStenosisPlaque DescriptionComments  +----------+--------+--------+--------+------------------+---------+ CCA Prox  78      18                                          +----------+--------+--------+--------+------------------+---------+ CCA Distal79      22                                          +----------+--------+--------+--------+------------------+---------+ ICA Prox  111     38      1-39%   calcific          Shadowing +----------+--------+--------+--------+------------------+---------+ ICA Mid   102     22                                          +----------+--------+--------+--------+------------------+---------+ ICA Distal78      30                                          +----------+--------+--------+--------+------------------+---------+  ECA       137     26                                          +----------+--------+--------+--------+------------------+---------+  +----------+--------+-------+----------------+-------------------+           PSV cm/sEDV cmsDescribe        Arm Pressure (mmHG) +----------+--------+-------+----------------+-------------------+ Subclavian120            Multiphasic, WNL                    +----------+--------+-------+----------------+-------------------+ +---------+--------+--+--------+--+---------+ VertebralPSV cm/s57EDV cm/s15Antegrade +---------+--------+--+--------+--+---------+  Left Carotid Findings: +----------+--------+--------+--------+------------------+---------+           PSV cm/sEDV cm/sStenosisPlaque DescriptionComments  +----------+--------+--------+--------+------------------+---------+ CCA Prox  61      13                                          +----------+--------+--------+--------+------------------+---------+ CCA Distal44      11                                          +----------+--------+--------+--------+------------------+---------+ ICA Prox  311     90      60-79%  calcific          Shadowing +----------+--------+--------+--------+------------------+---------+ ICA Mid   49      18                                          +----------+--------+--------+--------+------------------+---------+ ICA Distal49      18                                          +----------+--------+--------+--------+------------------+---------+ ECA       43      13                                          +----------+--------+--------+--------+------------------+---------+ +----------+--------+--------+----------------+-------------------+           PSV cm/sEDV cm/sDescribe        Arm Pressure (mmHG) +----------+--------+--------+----------------+-------------------+ [id number]             Multiphasic, WNL                    +----------+--------+--------+----------------+-------------------+ +---------+--------+--+--------+--+---------+ VertebralPSV cm/s85EDV  cm/s19Antegrade +---------+--------+--+--------+--+---------+   Summary: Right Carotid: Velocities in the right ICA are consistent with a 1-39% stenosis.                ICA velocities suggest upper end of scale. May be underestimated                due to heavey calcified plaque shadowing. Left Carotid: Velocities in the left ICA are consistent with a 60-79% stenosis.               ICA velocities suggest upper end of scale. May be underestimated  due to heavy calcified plaque shadowing.  *See table(s) above for measurements and observations.  Electronically signed by Eather Popp MD on 06/10/2024 at 6:44:39 AM.    Final    ECHOCARDIOGRAM COMPLETE Result Date: 06/09/2024    ECHOCARDIOGRAM REPORT   Patient Name:   Ronald Lowe Date of Exam: 06/09/2024 Medical Rec #:  [id number]      Height:       72.0 in Accession #:    [id number]     Weight:       151.7 lb Date of Birth:  1956-10-16      BSA:          1.894 m Patient Age:    67 years       BP:           98/73 mmHg Patient Gender: M              HR:           85 bpm. Exam Location:  Inpatient Procedure: 2D Echo, Cardiac Doppler and Color Doppler (Both Spectral and Color            Flow Doppler were utilized during procedure). Indications:    Stroke I63.9  History:        Patient has no prior history of Echocardiogram examinations.                 Stroke and COPD, Arrythmias:Atrial Fibrillation,                 Signs/Symptoms:Hypotension; Risk Factors:Hypertension,                 Dyslipidemia and Current Smoker.  Sonographer:    Thea Norlander RCS Referring Phys: ERIN C LEHNER  Sonographer Comments: Image acquisition challenging due to uncooperative patient. IMPRESSIONS  1. Left ventricular ejection fraction, by estimation, is 60 to 65%. The left ventricle has normal function. The left ventricle has no regional wall motion abnormalities. There is severe asymmetric left ventricular hypertrophy of the septal segment. Left  ventricular diastolic  parameters are consistent with Grade I diastolic dysfunction (impaired relaxation).  2. Right ventricular systolic function is normal. The right ventricular size is normal.  3. A small pericardial effusion is present. The pericardial effusion is localized near the right ventricle. There is no evidence of cardiac tamponade.  4. The mitral valve is abnormal. Trivial mitral valve regurgitation.  5. The aortic valve was not well visualized. Aortic valve regurgitation is not visualized. Aortic valve sclerosis/calcification is present, without any evidence of aortic stenosis. Comparison(s): No prior Echocardiogram. FINDINGS  Left Ventricle: Left ventricular ejection fraction, by estimation, is 60 to 65%. The left ventricle has normal function. The left ventricle has no regional wall motion abnormalities. The left ventricular internal cavity size was normal in size. There is  severe asymmetric left ventricular hypertrophy of the septal segment. Left ventricular diastolic parameters are consistent with Grade I diastolic dysfunction (impaired relaxation). Indeterminate filling pressures. Right Ventricle: The right ventricular size is normal. No increase in right ventricular wall thickness. Right ventricular systolic function is normal. Left Atrium: Left atrial size was normal in size. Right Atrium: Right atrial size was normal in size. Pericardium: A small pericardial effusion is present. The pericardial effusion is localized near the right ventricle. There is no evidence of cardiac tamponade. Mitral Valve: The mitral valve is abnormal. Mild mitral annular calcification. Trivial mitral valve regurgitation. Tricuspid Valve: The tricuspid valve is not well visualized. Tricuspid valve regurgitation is  not demonstrated. Aortic Valve: The aortic valve was not well visualized. Aortic valve regurgitation is not visualized. Aortic valve sclerosis/calcification is present, without any evidence of aortic stenosis. Aortic valve peak  gradient measures 8.8 mmHg. Pulmonic Valve: The pulmonic valve was not well visualized. Pulmonic valve regurgitation is not visualized. Aorta: The aortic root and ascending aorta are structurally normal, with no evidence of dilitation. IAS/Shunts: The interatrial septum was not well visualized.  LEFT VENTRICLE PLAX 2D LVIDd:         3.00 cm Diastology LVIDs:         2.10 cm LV e' medial:    5.00 cm/s LV PW:         1.10 cm LV E/e' medial:  11.9 LV IVS:        1.70 cm LV e' lateral:   8.49 cm/s                        LV E/e' lateral: 7.0  RIGHT VENTRICLE RV S prime:     18.60 cm/s TAPSE (M-mode): 2.7 cm LEFT ATRIUM             Index        RIGHT ATRIUM           Index LA diam:        3.60 cm 1.90 cm/m   RA Area:     13.30 cm LA Vol (A2C):   43.8 ml 23.12 ml/m  RA Volume:   26.30 ml  13.88 ml/m LA Vol (A4C):   54.0 ml 28.51 ml/m LA Biplane Vol: 52.1 ml 27.50 ml/m  AORTIC VALVE AV Vmax:      148.00 cm/s AV Peak Grad: 8.8 mmHg LVOT Vmax:    98.07 cm/s LVOT Vmean:   65.167 cm/s LVOT VTI:     0.154 m MITRAL VALVE MV Area (PHT): 2.50 cm    SHUNTS MV Decel Time: 303 msec    Systemic VTI: 0.15 m MV E velocity: 59.70 cm/s MV A velocity: 84.40 cm/s MV E/A ratio:  0.71 Vinie Maxcy MD Electronically signed by Vinie Maxcy MD Signature Date/Time: 06/09/2024/4:12:17 PM    Final    ECHOCARDIOGRAM COMPLETE Result Date: 06/07/2024    ECHOCARDIOGRAM REPORT   Patient Name:   Ronald Lowe Date of Exam: 06/07/2024 Medical Rec #:  [id number]      Height:       72.0 in Accession #:    [id number]     Weight:       151.7 lb Date of Birth:  10/22/1956      BSA:          1.894 m Patient Age:    67 years       BP:           11/11 mmHg Patient Gender: M              HR:           1 bpm. Exam Location:  Inpatient Procedure: 2D Echo (Both Spectral and Color Flow Doppler were utilized during            procedure). Indications:    stroke  History:        Patient has no prior history of Echocardiogram examinations.  Sonographer:     Koleen Popper Referring Phys: [id number] SRISHTI L BHAGAT IMPRESSIONS  1. Echo not perfomred due to patient non compliance.  2. Left ventricular ejection fraction, by estimation, is Not  done%. The left ventricle has not done function. Left ventricular endocardial border not optimally defined to evaluate regional wall motion. The left ventricular internal cavity size was not done. Left ventricular diastolic function could not be evaluated.  3. Right ventricular systolic function was not well visualized. The right ventricular size is not well visualized.  4. The mitral valve was not assessed. No evidence of mitral valve regurgitation.  5. The aortic valve was not assessed. Aortic valve regurgitation is not visualized.  6. Aortic Not imaged. FINDINGS  Left Ventricle: Left ventricular ejection fraction, by estimation, is Not done%. The left ventricle has not done function. Left ventricular endocardial border not optimally defined to evaluate regional wall motion. Strain was performed and the global longitudinal strain is indeterminate. The left ventricular internal cavity size was not done. Suboptimal image quality limits for assessment of left ventricular hypertrophy. Left ventricular diastolic function could not be evaluated. Right Ventricle: The right ventricular size is not well visualized. Right vetricular wall thickness was not assessed. Right ventricular systolic function was not well visualized. Left Atrium: Left atrial size was not assessed. Right Atrium: Right atrial size was not assessed. Pericardium: The pericardium was not assessed. Mitral Valve: The mitral valve was not assessed. No evidence of mitral valve regurgitation. Tricuspid Valve: The tricuspid valve is not assessed. Tricuspid valve regurgitation is not demonstrated. Aortic Valve: The aortic valve was not assessed. Aortic valve regurgitation is not visualized. Pulmonic Valve: The pulmonic valve was not assessed. Pulmonic valve regurgitation is not  visualized. Aorta: Not imaged and the ascending aorta was not well visualized. IAS/Shunts: The interatrial septum was not assessed. Additional Comments: Echo not perfomred due to patient non compliance. 3D was performed not requiring image post processing on an independent workstation and was indeterminate. Maude Emmer MD Electronically signed by Maude Emmer MD Signature Date/Time: 06/07/2024/3:38:19 PM    Final    DG Shoulder Right Result Date: 06/07/2024 CLINICAL DATA:  Right shoulder pain EXAM: RIGHT SHOULDER - 3 VIEW COMPARISON:  None Available. FINDINGS: There is no evidence of fracture or dislocation. Mild degenerative changes of the glenohumeral joint. Soft tissues are unremarkable. IMPRESSION: No acute fracture or dislocation. Electronically Signed   By: Limin  Xu M.D.   On: 06/07/2024 14:45   DG Forearm Right Result Date: 06/07/2024 CLINICAL DATA:  144615 Pain 144615 EXAM: RIGHT FOREARM - 2 VIEW COMPARISON:  None Available. FINDINGS: No acute fracture or dislocation. There is no evidence of arthropathy or other focal bone abnormality. Soft tissues are unremarkable. IMPRESSION: No acute fracture or dislocation. Electronically Signed   By: Rogelia Myers M.D.   On: 06/07/2024 10:57   DG Chest Portable 1 View Result Date: 06/07/2024 EXAM: 1 VIEW XRAY OF THE CHEST 06/07/2024 01:01:32 AM COMPARISON: CT chest dated 01/29/2024. CLINICAL HISTORY: Coughing. FINDINGS: LUNGS AND PLEURA: No focal pulmonary opacity. No pulmonary edema. No pleural effusion. No pneumothorax. HEART AND MEDIASTINUM: No acute abnormality of the cardiac and mediastinal silhouettes. Thoracic aortic atherosclerosis. BONES AND SOFT TISSUES: No acute osseous abnormality. IMPRESSION: 1. No acute findings. Electronically signed by: Pinkie Pebbles MD 06/07/2024 01:04 AM EDT RP Workstation: [id number]   MR BRAIN WO CONTRAST Result Date: 06/06/2024 CLINICAL DATA:  Neuro deficit, acute, stroke suspected EXAM: MRI HEAD WITHOUT CONTRAST  TECHNIQUE: Multiplanar, multiecho pulse sequences of the brain and surrounding structures were obtained without intravenous contrast. COMPARISON:  Same day CTA head/neck and CT head. FINDINGS: Brain: Multiple acute infarcts in the left MCA and left PCA territories, including the left  basal ganglia, left posterior limb of the internal capsule, left anterior insula, overlying left frontal and parietal lobes and the left occipital lobe. Mild associated edema in the anterior insula. No mass effect. No midline shift. Additional moderate patchy T2/FLAIR hyperintensities, compatible with chronic microvascular ischemic disease. No mass lesion or hydrocephalus. No extra-axial fluid collections. Vascular: Better evaluated on same day CTA. Skull and upper cervical spine: Normal marrow signal. Sinuses/Orbits: Clear sinuses.  No acute orbital findings. Other: No mastoid effusions. IMPRESSION: Multiple acute infarcts in the left MCA and left PCA territories, including the left basal ganglia, left posterior limb of the internal capsule, left anterior insula, overlying left frontal and parietal lobes and the left occipital lobe. No mass effect. Electronically Signed   By: Gilmore GORMAN Molt M.D.   On: 06/06/2024 23:29   CT ANGIO HEAD NECK W WO CM W PERF (CODE STROKE) Result Date: 06/06/2024 CLINICAL DATA:  Neuro deficit, acute, stroke suspected EXAM: CT ANGIOGRAPHY HEAD AND NECK CT PERFUSION BRAIN TECHNIQUE: Multidetector CT imaging of the head and neck was performed using the standard protocol during bolus administration of intravenous contrast. Multiplanar CT image reconstructions and MIPs were obtained to evaluate the vascular anatomy. Carotid stenosis measurements (when applicable) are obtained utilizing NASCET criteria, using the distal internal carotid diameter as the denominator. Multiphase CT imaging of the brain was performed following IV bolus contrast injection. Subsequent parametric perfusion maps were calculated  using RAPID software. RADIATION DOSE REDUCTION: This exam was performed according to the departmental dose-optimization program which includes automated exposure control, adjustment of the mA and/or kV according to patient size and/or use of iterative reconstruction technique. CONTRAST:  OMNIPAQUE  IOHEXOL  350 MG/ML SOLN COMPARISON:  Same day CT head. FINDINGS: CTA NECK FINDINGS Aortic arch: Aortic atherosclerosis. Great vessel origins are patent. Right carotid system: Atherosclerosis at the carotid bifurcation and involving the proximal ICA with approximately 55% stenosis. Left carotid system: Critical, nearly occlusive stenosis at the left carotid bifurcation. The degree of stenosis is difficult to quantify given the degree of bulky calcific atherosclerosis but likely at least 80%. More distal ICA is patent but diminutive. Vertebral arteries: Right dominant. Mild bilateral vertebral artery origin stenosis. Moderate right V2 vertebral artery stenosis due to compression from adjacent osteophyte. Skeleton: Severe multilevel degenerative change in the cervical spine. No acute abnormality on limited assessment. Other neck: No acute abnormality on limited assessment. Upper chest: Emphysema.  Visualized lung apices are clear. Review of the MIP images confirms the above findings CTA HEAD FINDINGS Anterior circulation: Bilateral intracranial ICAs are patent without hemodynamically significant stenosis. Bilateral M1 MCAs and the ACAs are patent. Hypoplastic left A1 ACA. Occluded proximal left M3 MCA just above the anterior sylvian fissure (for example see series 8, images 112 through 114). Posterior circulation: Bilateral intradural vertebral arteries, basilar artery and bilateral posterior cerebral arteries are patent without proximal hemodynamically significant stenosis. Venous sinuses: As permitted by contrast timing, patent. Review of the MIP images confirms the above findings CT Brain Perfusion Findings: ASPECTS:  9 CBF (<30%) Volume: 0mL Perfusion (Tmax>6.0s) volume: Mismatch Volume: spanning multiple vascular territories bilaterally both infratentorial and supratentorial and therefore likely largely artifactual. Infarction Location:No core infarct identified. IMPRESSION: CTA: 1. Occluded proximal left M3 MCA. 2. Critical, nearly occlusive stenosis at the left carotid bifurcation. The degree of stenosis is difficult to quantify given the degree of bulky calcific atherosclerosis but likely at least 80%. 3. Approximately 55% stenosis of the right ICA. 4. Moderate right V2 vertebral artery stenosis  due to compression from adjacent osteophyte. CTP: Nondiagnostic perfusion. Findings discussed with Dr. Jerrie via telephone at 10:30 p.m. Electronically Signed   By: Gilmore GORMAN Molt M.D.   On: 06/06/2024 22:48   CT HEAD CODE STROKE WO CONTRAST Result Date: 06/06/2024 CLINICAL DATA:  Code stroke.  Neuro deficit, acute, stroke suspected EXAM: CT HEAD WITHOUT CONTRAST TECHNIQUE: Contiguous axial images were obtained from the base of the skull through the vertex without intravenous contrast. RADIATION DOSE REDUCTION: This exam was performed according to the departmental dose-optimization program which includes automated exposure control, adjustment of the mA and/or kV according to patient size and/or use of iterative reconstruction technique. COMPARISON:  None Available. FINDINGS: Brain: No evidence of acute infarction, hemorrhage, hydrocephalus, extra-axial collection or mass lesion/mass effect. Moderate patchy white matter hypodensities, compatible with chronic microvascular ischemic disease. Vascular: No hyperdense vessel identified. Skull: Normal. Negative for fracture or focal lesion. Sinuses/Orbits: Clear sinuses.  No acute orbital findings. Other: No mastoid effusions. ASPECTS [facility] Stroke Program Early CT Score) Total score (0-10 with 10 being normal): 9. IMPRESSION: 1. Subtle hypoattenuation in the anterior  left insula, concerning for acute infarct. ASPECTS 9. 2. Moderate chronic microvascular ischemic disease. Findings discussed with Dr. Jerrie via telephone at 10:30 p.m. Electronically Signed   By: Gilmore GORMAN Molt M.D.   On: 06/06/2024 22:37    Labs:  Basic Metabolic Panel: Recent Labs  Lab 06/24/24 0521  NA 135  K 3.9  CL 105  CO2 22  GLUCOSE 101*  BUN 12  CREATININE 1.09  CALCIUM  8.9    CBC: Recent Labs  Lab 06/24/24 0521  WBC 6.4  HGB 13.6  HCT 38.2*  MCV 103.0*  PLT 383    CBG: No results for input(s): GLUCAP in the last 168 hours.  Family history.  Father with emphysema and CAD mother with stroke.  Denies any colon cancer or esophageal cancer or rectal cancer  Brief HPI:   Ronald Lowe is a 68 y.o. right-handed male with history significant for hypertension hepatitis C hyperlipidemia paroxysmal atrial fibrillation noncompliant with anticoagulation GERD CKD with creatinine baseline 0.94-1.53 COPD followed by pulmonary services alcohol and tobacco use.  Per chart review lives with spouse.  Modified independent prior to admission reportedly still working.  Presented 06/06/2024 with altered mental status and right side weakness.  Cranial CT scan showed subtle hypoattenuation in the anterior left insula concerning for acute infarct.  Moderate chronic microvascular ischemic disease.  CTA with occluded proximal left M3 MCA.  Critical nearly occlusive stenosis of the left carotid bifurcation.  Degree of stenosis was difficult to quantify given the degree of bulky calcific atherosclerosis but likely an 80%.  Approximately 55% stenosis of the right ICA.  Patient did not receive tPA.  MRI showed multiple acute infarcts in the left MCA and left PCA territories including left basal ganglia left posterior limb of internal capsule left anterior insula and overlying left frontal and parietal lobes in the left occipital lobe.  No mass effect.  Admission chemistries positive cocaine benzos  and amphetamines alcohol negative creatinine 1.53 total bilirubin 2.0.  Echocardiogram with ejection fraction of 60 to 65% no wall motion abnormalities grade 1 diastolic dysfunction.  Vascular surgery Dr. [name] for symptomatic left carotid stenosis and at this time wished to hold on any surgical intervention until mental status improved and this could be addressed as an outpatient.  He was cleared to begin aspirin  and Plavix  for CVA prophylaxis.  Subcutaneous Lovenox  for DVT prophylaxis.  Currently  on dysphagia #2 thin liquid diet.  Therapy evaluations completed due to patient decreased functional mobility was admitted for a comprehensive rehab program.   Hospital Course: Ronald Lowe was admitted to rehab 06/14/2024 for inpatient therapies to consist of PT, ST and OT at least three hours five days a week. Past admission physiatrist, therapy team and rehab RN have worked together to provide customized collaborative inpatient rehab.  Pertaining to patient's left MCA scattered infarct likely etiology due to symptomatic proximal ICA stenosis remained stable.  Patient would remain on aspirin  and Plavix  therapy.  Lovenox  for DVT prophylaxis.  Symptomatic left ICA stenosis follow-up outpatient Dr. [name] for possible surgical intervention.  Mood stabilization behavior with noted history of alcohol use monitoring for any signs of withdrawal enclosure bed added for patient's fall risk as well as delirium precautions with Seroquel  as indicated and trazodone  to help aid in sleep.  An enclosure bed was initially used for safety due to patient's decreased safety awareness.  He was on a dysphagia #3 thin liquid diet addressed and advanced as per speech therapy.  History of COPD with tobacco use continued inhalers NicoDerm patch added providing counts regards to cessation of nicotine  products.  Urine drug screen was positive for marijuana again receiving counsel regards to cessation of illicit drug use.  Patient with history  of PAF noncompliant with anticoagulation cardiac rate controlled.  AKI/CKD chemistries remained stable follow-up outpatient.  Blood pressure is controlled and monitored patient had been on lisinopril  prior to admission follow-up outpatient.   Blood pressures were monitored on TID basis and remained controlled and monitored     Rehab course: During patient's stay in rehab weekly team conferences were held to monitor patient's progress, set goals and discuss barriers to discharge. At admission, patient required min mod assist 5 feet to person hand-held assist minimal assist sit to stand  He/She  has had improvement in activity tolerance, balance, postural control as well as ability to compensate for deficits. He/She has had improvement in functional use RUE/LUE  and RLE/LLE as well as improvement in awareness.  Patient performed supine to sit edge of bed with supervision.  Occasional verbal cues needed for safety.  Perform sit to stand transfers from edge of bed wheelchair with contact-guard light minimal assist for safety awareness.  Ambulates around the nursing day room and from day room 2 at end of session using no assistive device with overall contact-guard minimal assist.  Supervision upper body bathing contact-guard lower body bathing supervision upper body dressing contact-guard lower body dressing.  SLP follow-up patient showing improvement in attention.  Scored a 14/30 on SLUMS.  Significant improvement in cognitive function he continues to present with impairments warranting continued speech intervention answering basic yes/no questions 83% accurate.  Full family teaching completed plan discharge to home.       Disposition:  Discharge disposition: 01-Home or Self Care        Diet: Mechanical soft  Special Instructions: No driving smoking or alcohol  Medications at discharge. 1.  Tylenol  as needed 2.  Plavix  75 mg p.o. daily 3.  Vitamin B12 1000 mcg p.o. daily 4.  Folic acid  1  mg p.o. daily 5.  Lisinopril  20 mg p.o. daily 6.  Multivitamin daily 7.  NicoDerm patch taper as directed 8.  Pepcid  40 mg p.o. daily 9.  Aspirin  81 mg p.o. daily 10.  Seroquel  25 mg p.o. twice daily as needed agitation 11.  Trelegy Ellipta  100-60 2.5-25 mcg 1 puff daily 12.  Thiamine  100 mg p.o. daily 13.  Trazodone  50 mg p.o. nightly as needed sleep 14.  Albuterol  inhaler 2 puffs every 6 hours as needed 15.  Vitamin B12 1000 mcg daily 16.  Lipitor 10 mg daily   30-35 minutes were spent completing discharge summary and discharge planning  Discharge Instructions     Ambulatory referral to Neurology   Complete by: As directed    An appointment is requested in approximately: Left MCA infarction   Ambulatory referral to Physical Medicine Rehab   Complete by: As directed    Moderate complexity follow-up 1 to 2 weeks left MCA   Ambulatory referral to Physical Medicine Rehab   Complete by: As directed         Follow-up Information     Kirsteins, Prentice BRAVO, MD Follow up.   Specialty: Physical Medicine and Rehabilitation Why: Office to call for appointment Contact information: [address] [place] [address] [phone]         [name] [name], MD Follow up.   Specialties: Vascular Surgery, Interventional Cardiology Why: Call for appointment Contact information: [address] [place] [address] [phone]                 Signed: Toribio Ronald Lowe 06/25/2024, 4:49 AM

## 2024-06-15 NOTE — Evaluation (Signed)
 Occupational Therapy Assessment and Plan  Patient Details  Name: Ronald Lowe MRN: [id number] Date of Birth: 08-31-1956  OT Diagnosis: altered mental status, ataxia, cognitive deficits, disturbance of vision, and muscle weakness (generalized) Rehab Potential: Rehab Potential (ACUTE ONLY): Fair ELOS: 14-16 days   Today's Date: 06/15/2024 OT Individual Time: 8954-8841 OT Individual Time Calculation (min): 73 min     Hospital Problem: Principal Problem:   Left middle cerebral artery stroke [facility])   Past Medical History:  Past Medical History:  Diagnosis Date   Aortic atherosclerosis (HCC)    Cervical spinal stenosis    Chronic kidney disease    COPD (chronic obstructive pulmonary disease) (HCC)    Coronary artery calcification seen on CT scan    ETOH abuse    a.) 28+ standard drinks/week   GERD (gastroesophageal reflux disease)    History of amiodarone  therapy    a.) in the past; self discontinued   History of medication noncompliance    a.) reported 10/2022 that he has been off NOAC for several months secondary to vertiginous symptoms, not acting like himself, and depression; b.) off/not taking as prescribed: antiarrhythmics, antihypertensives, statins, and MDIs   Hyperlipidemia    Hypertension    Long term current use of anticoagulant    a.) rivaroxaban    PAF (paroxysmal atrial fibrillation) (HCC)    a.) CHA2DS2VASc = 3 (age, HTN, vascular disease history);  b.) rate/rhythm maintained on oral carvediolol (self discontinued diltiazem  + amiodarone  + metoprolol ); chronically anticoagulated with rivaroxaban  (switched from apixaban  11/17/2022)   Tobacco abuse    Past Surgical History:  Past Surgical History:  Procedure Laterality Date   SHOULDER ARTHROSCOPY WITH SUBACROMIAL DECOMPRESSION, ROTATOR CUFF REPAIR AND BICEP TENDON REPAIR Left 12/07/2022   Procedure: SHOULDER ARTHROSCOPY WITH DEBRIDEMENT, DECOMPRESSION, ROTATOR CUFF REPAIR AND BICEPS TENODESIS.- RNFA;  Surgeon: Edie Norleen PARAS, MD;  Location: [facility] ORS;  Service: Orthopedics;  Laterality: Left;   SHOULDER ARTHROSCOPY WITH SUBACROMIAL DECOMPRESSION, ROTATOR CUFF REPAIR AND BICEP TENDON REPAIR Left 02/02/2023   Procedure: LEFT SHOULDER ARTHROSCOPY WITH DEBRIDEMENT AND REPAIR OF RECURRENT ROTATOR CUFF TEAR OF LEFT SHOULDER;  Surgeon: Edie Norleen PARAS, MD;  Location: [facility] ORS;  Service: Orthopedics;  Laterality: Left;   TONSILLECTOMY      Assessment & Plan Clinical Impression: Ronald Lowe is a 68 year old right-handed male with history significant for hypertension, hepatitis C, hyperlipidemia, paroxysmal atrial fibrillation noncompliant with anticoagulation, GERD, CKD with creatinine baseline 0.94-1.53, COPD followed by pulmonary services, alcohol/tobacco use. Per chart review patient lives with spouse. 1 level home one-step to entry. Modified independent prior to admission and reportedly still working. Presented 06/06/2024 with altered mental status and right side weakness. Cranial CT scan showed subtle hypoattenuation in the anterior left insula concerning for acute infarct. Moderate chronic microvascular ischemic disease. CTA with occluded proximal left M3 MCA. Critical nearly occlusive stenosis of left carotid bifurcation. Degree of stenosis was difficult to quantify given the degree of bulky calcific atherosclerosis but likely at 80%. Approximately 55% stenosis of the right ICA. Patient did not receive tPA. MRI showed multiple acute infarcts in the left MCA and left PCA territories, including left basal ganglia, left posterior limb of the internal capsule, left anterior insula and overlying left frontal and parietal lobes in the left occipital lobe. No mass effect. Admission chemistries positive cocaine benzos and amphetamines, alcohol negative, creatinine 1.53, total bilirubin 2.0. Echocardiogram with ejection fraction of 60 to 65% no wall motion abnormalities grade 1 diastolic dysfunction. Vascular surgery Dr. [name] for  symptomatic left carotid stenosis and at this time wished to hold on any surgical intervention until mental status improved and this could be addressed as an outpatient. He was cleared to begin aspirin  and Plavix  for CVA prophylaxis. Subcutaneous Lovenox  for DVT prophylaxis. Currently on dysphagia #2 thin liquid diet. Patient transferred to CIR on 06/14/2024 .    Patient currently requires mod-max A with basic self-care skills secondary to muscle weakness, decreased cardiorespiratoy endurance, unbalanced muscle activation, decreased coordination, and decreased motor planning, decreased visual perceptual skills and decreased visual motor skills, decreased attention to right and decreased motor planning, decreased initiation, decreased attention, decreased awareness, decreased problem solving, decreased safety awareness, decreased memory, and delayed processing, central origin, and decreased sitting balance, decreased standing balance, decreased postural control, and decreased balance strategies.  Prior to hospitalization, patient could complete ADLs with modified independent .  Patient will benefit from skilled intervention to decrease level of assist with basic self-care skills and increase independence with basic self-care skills prior to discharge home with care partner.  Anticipate patient will require 24 hour supervision and follow up home health.  OT - End of Session Activity Tolerance: Decreased this session Endurance Deficit: Yes OT Assessment Rehab Potential (ACUTE ONLY): Fair OT Patient demonstrates impairments in the following area(s): Balance;Behavior;Cognition;Endurance;Motor;Perception;Safety;Sensory;Vision OT Basic ADL's Functional Problem(s): Grooming;Bathing;Dressing;Toileting OT Transfers Functional Problem(s): Tub/Shower;Toilet OT Additional Impairment(s): Fuctional Use of Upper Extremity OT Plan OT Intensity: Minimum of 1-2 x/day, 45 to 90 minutes OT Frequency: 5 out of 7 days OT  Duration/Estimated Length of Stay: 14-16 days OT Treatment/Interventions: Balance/vestibular training;Self Care/advanced ADL retraining;UE/LE Coordination activities;Cognitive remediation/compensation;Functional mobility training;Skin care/wound managment;Visual/perceptual remediation/compensation;Community reintegration;Neuromuscular re-education;Splinting/orthotics;Wheelchair propulsion/positioning;Discharge planning;Pain management;Therapeutic Activities;Disease mangement/prevention;Patient/family education;Therapeutic Exercise;DME/adaptive equipment instruction;Psychosocial support;UE/LE Strength taining/ROM OT Self Feeding Anticipated Outcome(s): SUP OT Basic Self-Care Anticipated Outcome(s): Min A OT Toileting Anticipated Outcome(s): Min A OT Bathroom Transfers Anticipated Outcome(s): CGA OT Recommendation Patient destination: Home Follow Up Recommendations: Home health OT Equipment Recommended: To be determined   OT Evaluation Precautions/Restrictions  Precautions Precautions: Fall;Other (comment) Recall of Precautions/Restrictions: Impaired Restrictions Weight Bearing Restrictions Per Provider Order: No General Chart Reviewed: Yes Additional Pertinent History: hypertension, hepatitis C, hyperlipidemia, paroxysmal atrial fibrillation noncompliant with anticoagulation, GERD, CKD with creatinine baseline 0.94-1.53, COPD Family/Caregiver Present: No Vital Signs   Pain Pain Assessment Pain Scale: 0-10 Pain Score: 0-No pain Home Living/Prior Functioning Home Living Family/patient expects to be discharged to:: Private residence Living Arrangements: Spouse/significant other Available Help at Discharge: Family, Available 24 hours/day Type of Home: House Home Access: Stairs to enter [employer] of Steps: 2 Entrance Stairs-Rails: Right, Left Home Layout: One level Bathroom Shower/Tub: Engineer, manufacturing systems: Standard Bathroom Accessibility: Yes Additional  Comments: Pt unable to provide hx. Most info obtained from chart review. No family present during evaluation.  Lives With: Spouse IADL History Homemaking Responsibilities:  (unsure d/t Pt poor historian) Current License:  (unsure) Occupation: Other (comment) (per pt report and chart review he was working some PTA) Prior Function Level of Independence: Independent with basic ADLs, Independent with homemaking with ambulation, Independent with gait, Independent with transfers  Able to Take Stairs?: Yes Vision Baseline Vision/History: 1 Wears glasses Ability to See in Adequate Light: 0 Adequate Patient Visual Report: Diplopia (difficult to assess 2/2 dysarthria and cognitive deficits) Vision Assessment?: Vision impaired- to be further tested in functional context Additional Comments: Pt unable to follow commands for formal vision assessment; Pt was able to read large print of therapist's name tag. Noted to have difficulty seeing straw when drinking  from cup with potential double vision present. Would benefit from additional vision assessment. Perception  Perception: Impaired Perception-Other Comments: Pt with increased difficulty turning to the R; potential R inattention present Praxis Praxis: Impaired Praxis Impairment Details: Motor planning;Ideomotor Praxis-Other Comments: Would benefit from additional assessment; Pt with difficulty following commands during eval Cognition Cognition Overall Cognitive Status: No family/caregiver present to determine baseline cognitive functioning Arousal/Alertness: Awake/alert Orientation Level: Person;Place;Situation Person: Oriented Place: Disoriented Situation: Disoriented Memory: Impaired Memory Impairment: Decreased recall of new information Attention: Sustained Sustained Attention: Impaired Sustained Attention Impairment: Verbal basic;Functional basic Awareness: Impaired Awareness Impairment: Intellectual impairment Problem Solving:  Impaired Problem Solving Impairment: Functional basic Executive Function:  (all impaired d/t lower level deficits) Safety/Judgment: Impaired Brief Interview for Mental Status (BIMS) Repetition of Three Words (First Attempt): 1 (difficult to complete assessment d/t cognitive deficits and dysarthria) Temporal Orientation: Year: Missed by 1 year Temporal Orientation: Month: Missed by more than 1 month Temporal Orientation: Day: Incorrect Recall: Sock: No, could not recall Recall: Blue: Yes, after cueing (a color) Recall: Bed: No, could not recall BIMS Summary Score: 4 Sensation Sensation Light Touch: Impaired by gross assessment Hot/Cold: Not tested Proprioception: Impaired by gross assessment Stereognosis: Not tested Additional Comments: Pt would benefit from additional sensory assessment; presents to be impaired in R UE Coordination Gross Motor Movements are Fluid and Coordinated: No Fine Motor Movements are Fluid and Coordinated: No Coordination and Movement Description: trunk and U/LE ataxia present Finger Nose Finger Test: difficulty following commands during assessment, bilateral undershooting with dysmetria Motor  Motor Motor: Ataxia;Motor impersistence Motor - Skilled Clinical Observations: Mild R sided weakness; ataxic, uncoordinated motor movements  Trunk/Postural Assessment  Cervical Assessment Cervical Assessment: Exceptions to [facility] (mild forward head) Thoracic Assessment Thoracic Assessment: Exceptions to [facility] (rounded shoulders) Lumbar Assessment Lumbar Assessment: Within Functional Limits Postural Control Postural Control: Deficits on evaluation Trunk Control: decreased Righting Reactions: delayed Protective Responses: delayed  Balance Balance Balance Assessed: Yes Static Sitting Balance Static Sitting - Balance Support: Feet supported Static Sitting - Level of Assistance: 5: Stand by assistance Dynamic Sitting Balance Dynamic Sitting - Balance  Support: Feet unsupported;During functional activity Dynamic Sitting - Level of Assistance: 4: Min assist Sitting balance - Comments: sitting EOB Static Standing Balance Static Standing - Balance Support: Right upper extremity supported Static Standing - Level of Assistance: 4: Min assist Dynamic Standing Balance Dynamic Standing - Balance Support: Right upper extremity supported Dynamic Standing - Level of Assistance: 4: Min assist;3: Mod assist Extremity/Trunk Assessment RUE Assessment RUE Assessment: Exceptions to [facility] General Strength Comments: 4/5 overall with ataxic, uncoordinated motor movements present LUE Assessment LUE Assessment: Exceptions to [facility] General Strength Comments: limited shoulder ROM- flexion/abduction ~45*; 4+/5 otherwise  Care Tool Care Tool Self Care Eating Eating activity did not occur: Refused      Oral Care    Oral Care Assist Level: Total assistance - Patient < 25%    Bathing   Body parts bathed by patient: Face;Right arm Body parts bathed by helper: Right arm;Left arm;Face;Left lower leg;Right lower leg;Left upper leg;Right upper leg;Buttocks;Front perineal area;Abdomen;Chest   Assist Level: Total Assistance - Patient < 25%    Upper Body Dressing(including orthotics)   What is the patient wearing?: Pull over shirt   Assist Level: Maximal Assistance - Patient 25 - 49%    Lower Body Dressing (excluding footwear)   What is the patient wearing?: Pants;Underwear/pull up Assist for lower body dressing: Total Assistance - Patient < 25%    Putting on/Taking off footwear  What is the patient wearing?: Socks Assist for footwear: Dependent - Patient 0%       Care Tool Toileting Toileting activity   Assist for toileting: Total Assistance - Patient < 25%     Care Tool Bed Mobility Roll left and right activity   Roll left and right assist level: Maximal Assistance - Patient 25 - 49%    Sit to lying activity   Sit to lying assist level: Maximal  Assistance - Patient 25 - 49%    Lying to sitting on side of bed activity   Lying to sitting on side of bed assist level: the ability to move from lying on the back to sitting on the side of the bed with no back support.: Maximal Assistance - Patient 25 - 49%     Care Tool Transfers Sit to stand transfer   Sit to stand assist level: Minimal Assistance - Patient > 75%    Chair/bed transfer   Chair/bed transfer assist level: 2 Helpers (+2 HHA min-mod A)     Toilet transfer   Assist Level: 2 Helpers (+2 HHA min-mod A)     Care Tool Cognition  Expression of Ideas and Wants Expression of Ideas and Wants: 2. Frequent difficulty - frequently exhibits difficulty with expressing needs and ideas  Understanding Verbal and Non-Verbal Content Understanding Verbal and Non-Verbal Content: 2. Sometimes understands - understands only basic conversations or simple, direct phrases. Frequently requires cues to understand   Memory/Recall Ability Memory/Recall Ability : None of the above were recalled   Refer to Care Plan for Long Term Goals  SHORT TERM GOAL WEEK 1 OT Short Term Goal 1 (Week 1): Pt will complete UB bathing with min A OT Short Term Goal 2 (Week 1): Pt will complete toilet transfers with min A OT Short Term Goal 3 (Week 1): Pt will attend to functional task in quiet environment for >1 min with SUP OT Short Term Goal 4 (Week 1): Pt will participate in additional vision assessment with SUP  Recommendations for other services: None    Skilled Therapeutic Intervention Skilled Therapeutic Interventions/Progress Updates:  1:1 OT evaluation and intervention initiated with skilled education provided on OT role, goals, and POC. Pt received sleeping in posey bed, waking upon OT arrival. Pt presenting to be disoriented, however pleasant receptive to skilled OT session reporting 0/10 pain- OT offering intermittent rest breaks, repositioning, and therapeutic support to optimize participation in  therapy session. Gentle reorientation provided throughout session. Pt completed ADLs at levels listed below this session with increased assistance required d/t cognitive deficits, decreased coordination, ataxia, balance deficits, and weakness. D/t Pt's cognitive deficits, completing formal vision, strength, and sensory assessment was limited and Pt would benefit from additional assessment in these areas. Max multimodal cues required throughout session to support task initiation and overall participation in ADLs. Pt would benefit from continued OT services in IPR setting. Pt was left resting in posey bed with call bell in reach and all needs met.   ADL ADL Eating: Not assessed Grooming: Maximal assistance Where Assessed-Grooming: Edge of bed Upper Body Bathing: Maximal assistance Where Assessed-Upper Body Bathing: Edge of bed Lower Body Bathing: Dependent Where Assessed-Lower Body Bathing: Edge of bed Upper Body Dressing: Maximal assistance Where Assessed-Upper Body Dressing: Edge of bed Lower Body Dressing: Dependent Where Assessed-Lower Body Dressing: Edge of bed Toileting: Dependent Where Assessed-Toileting: Bedside Commode Toilet Transfer: Moderate assistance;Maximal assistance (+2 HHA min/mod A) Toilet Transfer Method: Stand pivot Toilet Transfer Equipment: Gaffer:  Not assessed Walk-In Shower Transfer: Not assessed Mobility  Bed Mobility Bed Mobility: Supine to Sit;Sit to Supine Supine to Sit: Maximal Assistance - Patient - Patient 25-49% Sit to Supine: Maximal Assistance - Patient 25-49% Transfers Sit to Stand: Minimal Assistance - Patient > 75% Stand to Sit: Minimal Assistance - Patient > 75%   Discharge Criteria: Patient will be discharged from OT if patient refuses treatment 3 consecutive times without medical reason, if treatment goals not met, if there is a change in medical status, if patient makes no progress towards goals or if patient is  discharged from hospital.  The above assessment, treatment plan, treatment alternatives and goals were discussed and mutually agreed upon: by patient  Katheryn SHAUNNA [place]ines 06/15/2024, 12:08 PM

## 2024-06-15 NOTE — Plan of Care (Signed)
  Problem: RH Balance Goal: LTG Patient will maintain dynamic standing balance (PT) Description: LTG:  Patient will maintain dynamic standing balance with assistance during mobility activities (PT) Flowsheets (Taken 06/15/2024 1543) LTG: Pt will maintain dynamic standing balance during mobility activities with:: Contact Guard/Touching assist   Problem: Sit to Stand Goal: LTG:  Patient will perform sit to stand with assistance level (PT) Description: LTG:  Patient will perform sit to stand with assistance level (PT) Flowsheets (Taken 06/15/2024 1543) LTG: PT will perform sit to stand in preparation for functional mobility with assistance level: Contact Guard/Touching assist   Problem: RH Bed Mobility Goal: LTG Patient will perform bed mobility with assist (PT) Description: LTG: Patient will perform bed mobility with assistance, with/without cues (PT). Flowsheets (Taken 06/15/2024 1543) LTG: Pt will perform bed mobility with assistance level of: Supervision/Verbal cueing   Problem: RH Bed to Chair Transfers Goal: LTG Patient will perform bed/chair transfers w/assist (PT) Description: LTG: Patient will perform bed to chair transfers with assistance (PT). Flowsheets (Taken 06/15/2024 1543) LTG: Pt will perform Bed to Chair Transfers with assistance level: Contact Guard/Touching assist   Problem: RH Car Transfers Goal: LTG Patient will perform car transfers with assist (PT) Description: LTG: Patient will perform car transfers with assistance (PT). Flowsheets (Taken 06/15/2024 1543) LTG: Pt will perform car transfers with assist:: Contact Guard/Touching assist   Problem: RH Ambulation Goal: LTG Patient will ambulate in controlled environment (PT) Description: LTG: Patient will ambulate in a controlled environment, # of feet with assistance (PT). Flowsheets (Taken 06/15/2024 1543) LTG: Pt will ambulate in controlled environ  assist needed:: Contact Guard/Touching assist LTG: Ambulation distance  in controlled environment: 150' Goal: LTG Patient will ambulate in home environment (PT) Description: LTG: Patient will ambulate in home environment, # of feet with assistance (PT). Flowsheets (Taken 06/15/2024 1543) LTG: Pt will ambulate in home environ  assist needed:: Contact Guard/Touching assist LTG: Ambulation distance in home environment: 50'   Problem: RH Stairs Goal: LTG Patient will ambulate up and down stairs w/assist (PT) Description: LTG: Patient will ambulate up and down # of stairs with assistance (PT) Flowsheets (Taken 06/15/2024 1543) LTG: Pt will ambulate up/down stairs assist needed:: Contact Guard/Touching assist LTG: Pt will  ambulate up and down number of stairs: 2 per home set up

## 2024-06-15 NOTE — Discharge Instructions (Addendum)
 Inpatient Rehab Discharge Instructions  Ronald Lowe Discharge date and time: No discharge date for patient encounter.   Activities/Precautions/ Functional Status: Activity: As tolerated Diet: Mechanical soft Wound Care: Routine skin checks Functional status:  ___ No restrictions     ___ Walk up steps independently ___ 24/7 supervision/assistance   ___ Walk up steps with assistance ___ Intermittent supervision/assistance  ___ Bathe/dress independently ___ Walk with walker     _x__ Bathe/dress with assistance ___ Walk Independently    ___ Shower independently ___ Walk with assistance    ___ Shower with assistance ___ No alcohol     ___ Return to work/school ________  Special Instructions: No driving smoking or alcohol    COMMUNITY REFERRALS UPON DISCHARGE:    Outpatient: PT      OT     ST                Agency:[facility]- [place] location  Phone:[phone]              Appointment Date/Time: *Please expect follow-up within 7-10 business days to schedule your appointment. If you have not received follow-up, be sure to contact the site directly.*   Medical Equipment/Items Ordered: 3in1 bedside commode                                                  Agency/Supplier:[facility] [phone]   My questions have been answered and I understand these instructions. I will adhere to these goals and the provided educational materials after my discharge from the hospital.  Patient/Caregiver Signature _______________________________ Date __________  Clinician Signature _______________________________________ Date __________  Please bring this form and your medication list with you to all your follow-up doctor's appointments. STROKE/TIA DISCHARGE INSTRUCTIONS SMOKING Cigarette smoking nearly doubles your risk of having a stroke & is the single most alterable risk factor  If you smoke or have smoked in the last 12 months, you are advised to quit smoking for your health.  Most of the excess cardiovascular risk related to smoking disappears within a year of stopping. Ask you doctor about anti-smoking medications [place] Quit Line: 1-800-QUIT NOW Free Smoking Cessation Classes [phone]  CHOLESTEROL Know your levels; limit fat & cholesterol in your diet  Lipid Panel     Component Value Date/Time   CHOL 141 06/07/2024 0836   CHOL 137 07/01/2022 1039   TRIG 79 06/07/2024 0836   HDL 59 06/07/2024 0836   HDL 69 07/01/2022 1039   CHOLHDL 2.4 06/07/2024 0836   VLDL 16 06/07/2024 0836   LDLCALC 66 06/07/2024 0836   LDLCALC 54 07/01/2022 1039     Many patients benefit from treatment even if their cholesterol is at goal. Goal: Total Cholesterol (CHOL) less than 160 Goal:  Triglycerides (TRIG) less than 150 Goal:  HDL greater than 40 Goal:  LDL (LDLCALC) less than 100   BLOOD PRESSURE American Stroke Association blood pressure target is less that 120/80 mm/Hg  Your discharge blood pressure is:  BP: (!) 160/87 Monitor your blood pressure Limit your salt and alcohol intake Many individuals will require more than one medication for high blood pressure  DIABETES (A1c is a blood sugar average for last 3 months) Goal HGBA1c is under 7% (HBGA1c is blood sugar average for last 3 months)  Diabetes: No known diagnosis of diabetes  Lab Results  Component Value Date   HGBA1C 4.2 (L) 06/07/2024    Your HGBA1c can be lowered with medications, healthy diet, and exercise. Check your blood sugar as directed by your physician Call your physician if you experience unexplained or low blood sugars.  PHYSICAL ACTIVITY/REHABILITATION Goal is 30 minutes at least 4 days per week  Activity: Increase activity slowly, Therapies: Physical Therapy: Home Health Return to work:  Activity decreases your risk of heart attack and stroke and makes your heart stronger.  It helps control your weight and blood pressure; helps you relax and can improve your mood. Participate in a regular  exercise program. Talk with your doctor about the best form of exercise for you (dancing, walking, swimming, cycling).  DIET/WEIGHT Goal is to maintain a healthy weight  Your discharge diet is:  Diet Order             DIET DYS 2 Room service appropriate? Yes with Assist; Fluid consistency: Thin  Diet effective now                   liquids Your height is:    Your current weight is: Weight: 64.8 kg Your Body Mass Index (BMI) is:  BMI (Calculated): 19.38 Following the type of diet specifically designed for you will help prevent another stroke. Your goal weight range is:   Your goal Body Mass Index (BMI) is 19-24. Healthy food habits can help reduce 3 risk factors for stroke:  High cholesterol, hypertension, and excess weight.  RESOURCES Stroke/Support Group:  Call [phone]   STROKE EDUCATION PROVIDED/REVIEWED AND GIVEN TO PATIENT Stroke warning signs and symptoms How to activate emergency medical system (call 911). Medications prescribed at discharge. Need for follow-up after discharge. Personal risk factors for stroke. Pneumonia vaccine given: No Flu vaccine given: No My questions have been answered, the writing is legible, and I understand these instructions.  I will adhere to these goals & educational materials that have been provided to me after my discharge from the hospital.

## 2024-06-15 NOTE — Evaluation (Signed)
 Physical Therapy Assessment and Plan  Patient Details  Name: Ronald Lowe MRN: [id number] Date of Birth: 24-Mar-1956  PT Diagnosis: Abnormality of gait, Ataxia, Difficulty walking, Impaired cognition, Impaired sensation, and Muscle weakness Rehab Potential: Good ELOS: 2-3 weeks   Today's Date: 06/15/2024 PT Individual Time: 8697-8587 PT Individual Time Calculation (min): 70 min    Hospital Problem: Principal Problem:   Left middle cerebral artery stroke [facility])   Past Medical History:  Past Medical History:  Diagnosis Date   Aortic atherosclerosis (HCC)    Cervical spinal stenosis    Chronic kidney disease    COPD (chronic obstructive pulmonary disease) (HCC)    Coronary artery calcification seen on CT scan    ETOH abuse    a.) 28+ standard drinks/week   GERD (gastroesophageal reflux disease)    History of amiodarone  therapy    a.) in the past; self discontinued   History of medication noncompliance    a.) reported 10/2022 that he has been off NOAC for several months secondary to vertiginous symptoms, not acting like himself, and depression; b.) off/not taking as prescribed: antiarrhythmics, antihypertensives, statins, and MDIs   Hyperlipidemia    Hypertension    Long term current use of anticoagulant    a.) rivaroxaban    PAF (paroxysmal atrial fibrillation) (HCC)    a.) CHA2DS2VASc = 3 (age, HTN, vascular disease history);  b.) rate/rhythm maintained on oral carvediolol (self discontinued diltiazem  + amiodarone  + metoprolol ); chronically anticoagulated with rivaroxaban  (switched from apixaban  11/17/2022)   Tobacco abuse    Past Surgical History:  Past Surgical History:  Procedure Laterality Date   SHOULDER ARTHROSCOPY WITH SUBACROMIAL DECOMPRESSION, ROTATOR CUFF REPAIR AND BICEP TENDON REPAIR Left 12/07/2022   Procedure: SHOULDER ARTHROSCOPY WITH DEBRIDEMENT, DECOMPRESSION, ROTATOR CUFF REPAIR AND BICEPS TENODESIS.- RNFA;  Surgeon: Edie Norleen PARAS, MD;  Location: [facility] ORS;   Service: Orthopedics;  Laterality: Left;   SHOULDER ARTHROSCOPY WITH SUBACROMIAL DECOMPRESSION, ROTATOR CUFF REPAIR AND BICEP TENDON REPAIR Left 02/02/2023   Procedure: LEFT SHOULDER ARTHROSCOPY WITH DEBRIDEMENT AND REPAIR OF RECURRENT ROTATOR CUFF TEAR OF LEFT SHOULDER;  Surgeon: Edie Norleen PARAS, MD;  Location: [facility] ORS;  Service: Orthopedics;  Laterality: Left;   TONSILLECTOMY      Assessment & Plan Clinical Impression: Patient is a 68 year old right-handed male with history significant for hypertension, hepatitis C, hyperlipidemia, paroxysmal atrial fibrillation noncompliant with anticoagulation, GERD, CKD with creatinine baseline 0.94-1.53, COPD followed by pulmonary services, alcohol/tobacco use. Per chart review patient lives with spouse. 1 level home one-step to entry. Modified independent prior to admission and reportedly still working. Presented 06/06/2024 with altered mental status and right side weakness. Cranial CT scan showed subtle hypoattenuation in the anterior left insula concerning for acute infarct. Moderate chronic microvascular ischemic disease. CTA with occluded proximal left M3 MCA. Critical nearly occlusive stenosis of left carotid bifurcation. Degree of stenosis was difficult to quantify given the degree of bulky calcific atherosclerosis but likely at 80%. Approximately 55% stenosis of the right ICA. Patient did not receive tPA. MRI showed multiple acute infarcts in the left MCA and left PCA territories, including left basal ganglia, left posterior limb of the internal capsule, left anterior insula and overlying left frontal and parietal lobes in the left occipital lobe. No mass effect. Admission chemistries positive cocaine benzos and amphetamines, alcohol negative, creatinine 1.53, total bilirubin 2.0. Echocardiogram with ejection fraction of 60 to 65% no wall motion abnormalities grade 1 diastolic dysfunction. Vascular surgery Dr. [name] for symptomatic left carotid stenosis and at  this time wished to hold on any surgical intervention until mental status improved and this could be addressed as an outpatient. He was cleared to begin aspirin  and Plavix  for CVA prophylaxis. Subcutaneous Lovenox  for DVT prophylaxis. Currently on dysphagia #2 thin liquid diet. Therapy evaluations completed due to patient decreased functional mobility was admitted for a comprehensive rehab program.  Patient currently requires max with mobility secondary to muscle weakness, decreased cardiorespiratoy endurance, impaired timing and sequencing, unbalanced muscle activation, decreased coordination, and decreased motor planning, decreased visual acuity and decreased visual perceptual skills, decreased attention to right and decreased motor planning, decreased initiation, decreased attention, decreased awareness, decreased problem solving, decreased safety awareness, decreased memory, and delayed processing, and decreased standing balance, decreased postural control, and decreased balance strategies.  Prior to hospitalization, patient was modified independent  with mobility and lived with Spouse in a House home.  Home access is 2Stairs to enter.  Patient will benefit from skilled PT intervention to maximize safe functional mobility, minimize fall risk, and decrease caregiver burden for planned discharge home with 24 hour supervision.  Anticipate patient will benefit from follow up HH at discharge.  PT - End of Session Activity Tolerance: Tolerates 30+ min activity with multiple rests Endurance Deficit: Yes Endurance Deficit Description: rest breaks with all mobility tasks PT Assessment Rehab Potential (ACUTE/IP ONLY): Good PT Barriers to Discharge: Decreased caregiver support;Inaccessible home environment;Home environment access/layout;Behavior;Incontinence PT Barriers to Discharge Comments: level of assist at this time exceeds abilities of caregiver PT Patient demonstrates impairments in the following  area(s): Behavior;Balance;Endurance;Motor;Perception;Safety;Sensory PT Transfers Functional Problem(s): Bed Mobility;Bed to Chair;Car PT Locomotion Functional Problem(s): Ambulation;Stairs;Wheelchair Mobility PT Plan PT Intensity: Minimum of 1-2 x/day ,45 to 90 minutes PT Frequency: 5 out of 7 days PT Duration Estimated Length of Stay: 2-3 weeks PT Treatment/Interventions: Ambulation/gait training;Disease management/prevention;Stair training;Pain management;Visual/perceptual remediation/compensation;Balance/vestibular training;DME/adaptive equipment instruction;Patient/family education;Therapeutic Activities;Cognitive remediation/compensation;Psychosocial support;Therapeutic Exercise;Community reintegration;Functional mobility training;Skin care/wound management;UE/LE Strength taining/ROM;Discharge planning;Neuromuscular re-education;UE/LE Coordination activities PT Transfers Anticipated Outcome(s): CGA PT Locomotion Anticipated Outcome(s): CGA ambulatory PT Recommendation Recommendations for Other Services: None Follow Up Recommendations: Outpatient PT Patient destination: Home Equipment Recommended: To be determined   PT Evaluation Precautions/Restrictions Precautions Precautions: Fall;Other (comment) Recall of Precautions/Restrictions: Impaired Precaution/Restrictions Comments: posey belt Restrictions Weight Bearing Restrictions Per Provider Order: No Pain Interference Pain Interference Pain Effect on Sleep: 8. Unable to answer Pain Interference with Therapy Activities: 8. Unable to answer Pain Interference with Day-to-Day Activities: 8. Unable to answer Home Living/Prior Functioning Home Living Available Help at Discharge: Family;Available 24 hours/day Type of Home: House Home Access: Stairs to enter [employer] of Steps: 2 Entrance Stairs-Rails: Right;Left Home Layout: One level Bathroom Shower/Tub: Engineer, manufacturing systems: Standard Bathroom  Accessibility: Yes Additional Comments: Pt unable to provide hx. Most info obtained from chart review. No family present during evaluation.  Lives With: Spouse Prior Function Level of Independence: Independent with basic ADLs;Independent with homemaking with ambulation;Independent with gait;Independent with transfers  Able to Take Stairs?: Yes Vision/Perception  Vision - History Ability to See in Adequate Light: 0 Adequate Vision - Assessment Additional Comments: Pt unable to follow commands for formal vision assessment; Pt was able to read large print of therapist's name tag. Noted to have difficulty seeing straw when drinking from cup with potential double vision present. Would benefit from additional vision assessment. Perception Perception: Impaired Preception Impairment Details: Inattention/Neglect;Spatial orientation Perception-Other Comments: Pt with increased difficulty turning to the R; potential R inattention present Praxis Praxis: Impaired Praxis Impairment Details: Motor planning;Ideomotor Praxis-Other Comments:  Would benefit from additional assessment; Pt with difficulty following commands during eval  Cognition Overall Cognitive Status: No family/caregiver present to determine baseline cognitive functioning Arousal/Alertness: Awake/alert Orientation Level: Oriented to person Year: Other (Comment) Month: May Day of Week: Incorrect Attention: Sustained Sustained Attention: Impaired Sustained Attention Impairment: Verbal basic;Functional basic Memory: Impaired Memory Impairment: Decreased recall of new information Awareness: Impaired Awareness Impairment: Intellectual impairment Problem Solving: Impaired Problem Solving Impairment: Functional basic Executive Function:  (impaired 2/2 lower level deficits) Behaviors: Restless;Verbal agitation;Confabulation;Poor frustration tolerance;Impulsive Safety/Judgment: Impaired Sensation Sensation Light Touch: Impaired by gross  assessment Hot/Cold: Not tested Proprioception: Impaired by gross assessment Stereognosis: Not tested Additional Comments: Pt would benefit from additional sensory assessment; presents to be impaired in R UE, unable to formally assess secondary to cognitive and communication deficits Coordination Gross Motor Movements are Fluid and Coordinated: No Fine Motor Movements are Fluid and Coordinated: No Coordination and Movement Description: trunk and U/LE ataxia present Finger Nose Finger Test: difficulty following commands during assessment, bilateral undershooting with dysmetria Motor  Motor Motor: Ataxia;Motor impersistence Motor - Skilled Clinical Observations: Mild R sided weakness; ataxic, uncoordinated motor movements  Trunk/Postural Assessment  Cervical Assessment Cervical Assessment: Exceptions to [facility] (forward head) Thoracic Assessment Thoracic Assessment: Exceptions to [facility] (rounded shoulders) Lumbar Assessment Lumbar Assessment: Within Functional Limits Postural Control Postural Control: Deficits on evaluation Trunk Control: decreased Righting Reactions: delayed Protective Responses: delayed  Balance Balance Balance Assessed: Yes Static Sitting Balance Static Sitting - Balance Support: Feet supported Static Sitting - Level of Assistance: 5: Stand by assistance Dynamic Sitting Balance Dynamic Sitting - Balance Support: Feet unsupported;During functional activity Dynamic Sitting - Level of Assistance: 4: Min assist Sitting balance - Comments: sitting EOB Static Standing Balance Static Standing - Balance Support: Right upper extremity supported Static Standing - Level of Assistance: 4: Min assist Dynamic Standing Balance Dynamic Standing - Balance Support: Right upper extremity supported Dynamic Standing - Level of Assistance: 3: Mod assist Extremity Assessment  RUE Assessment RUE Assessment: Exceptions to [facility] General Strength Comments: 4/5 overall with ataxic,  uncoordinated motor movements present LUE Assessment LUE Assessment: Exceptions to [facility] General Strength Comments: limited shoulder ROM- flexion/abduction ~45*; 4+/5 otherwise RLE Assessment RLE Assessment: Exceptions to [facility] General Strength Comments: grossly 3/5 functionally, unable to formally assess due to cog deficits LLE Assessment LLE Assessment: Exceptions to [facility] General Strength Comments: grossly 3/5 functionally, unable to formally assess due to cog deficits  Care Tool Care Tool Bed Mobility Roll left and right activity   Roll left and right assist level: Maximal Assistance - Patient 25 - 49%    Sit to lying activity   Sit to lying assist level: Moderate Assistance - Patient 50 - 74%    Lying to sitting on side of bed activity   Lying to sitting on side of bed assist level: the ability to move from lying on the back to sitting on the side of the bed with no back support.: Maximal Assistance - Patient 25 - 49%     Care Tool Transfers Sit to stand transfer   Sit to stand assist level: Minimal Assistance - Patient > 75%    Chair/bed transfer   Chair/bed transfer assist level: Moderate Assistance - Patient 50 - 74%    Car transfer Car transfer activity did not occur: Safety/medical concerns (unsafe to complete due to cognitive deficits)        Care Tool Locomotion Ambulation   Assist level: 2 helpers Assistive device: Hand held assist Max distance: 180'  Walk 10 feet activity   Assist level: 2 helpers Assistive device: Hand held assist   Walk 50 feet with 2 turns activity   Assist level: 2 helpers Assistive device: Hand held assist  Walk 150 feet activity   Assist level: 2 helpers Assistive device: Hand held assist  Walk 10 feet on uneven surfaces activity Walk 10 feet on uneven surfaces activity did not occur: Safety/medical concerns      Stairs   Assist level: Moderate Assistance - Patient - 50 - 74% Stairs assistive device: 2 hand rails Max number of  stairs: 1 (4 step in //bars)  Walk up/down 1 step activity   Walk up/down 1 step (curb) assist level: Moderate Assistance - Patient - 50 - 74% Walk up/down 1 step or curb assistive device: 2 hand rails  Walk up/down 4 steps activity Walk up/down 4 steps activity did not occur: Safety/medical concerns      Walk up/down 12 steps activity Walk up/down 12 steps activity did not occur: Safety/medical concerns      Pick up small objects from floor Pick up small object from the floor (from standing position) activity did not occur: Safety/medical concerns      Wheelchair Is the patient using a wheelchair?: Yes Type of Wheelchair: Manual   Wheelchair assist level: Total Assistance - Patient < 25% Max wheelchair distance: 150'  Wheel 50 feet with 2 turns activity   Assist Level: Total Assistance - Patient < 25%  Wheel 150 feet activity   Assist Level: Total Assistance - Patient < 25%    Refer to Care Plan for Long Term Goals  SHORT TERM GOAL WEEK 1 PT Short Term Goal 1 (Week 1): Pt will complete bed mobility with min assist consistently PT Short Term Goal 2 (Week 1): Pt will complete transfers with min assist consistently PT Short Term Goal 3 (Week 1): Pt will ambulate 150' with min assist consistently PT Short Term Goal 4 (Week 1): Pt will complete up/down 4 steps with BHRs min assist  Recommendations for other services: None   Skilled Therapeutic Intervention Evaluation completed (see details above and below) with education on PT POC and goals and individual treatment initiated with focus on gait training and therapeutic activities to promote participation with self care tasks with emphasis on sequencing. Pt dysarthric and tangential throughout session, unable to report pain however in no acute distress. Pt completes transfers with min/modA +2 progress to mod assist x1 throughout session, pt demonstrating decreased sequencing, possible visual impairments with pt having difficulty turning  completely to sit in [facility] requiring mod assist for turning hips to chair.  Pt completes bed mobility with max assist secondary to impaired sequencing and command following. Pt completes gait 180' with min/mod assist +2 HHA, cues for increasing step length with pt demonstrating decreased stride with RLE, truncal/BLE ataxia. Pt ambulates 77' with mod assist +1 R HHA with cues for attention to task. Pt self propels WC 150' with BLEs total assist and max verbal cues for BLE foot propulsion secondary to impaired sequencing. Pt attempts up/down 1 4 step in //bars secondary to impaired safety awareness, requires mod assist for 2 reps before impulsively sitting and requesting to use restroom. Pt returned to room and completes ambulatory transfer with mod assist to bathroom, requires max assist for managing LB dressing and total assist for periarea hygiene with max cues for sequencing tasks. Pt continent of bowel, charted. Pt ambulates to sink to complete hand hygiene, requires max multimodal cues and  hand over hand assist to sequence hand washing in sink with pt demonstrating dysmetria. Pt returns to posey bed with max assist, remains supine with all needs within reach, call light in place, posey bed enclosed at end of session.   Mobility Bed Mobility Bed Mobility: Supine to Sit;Sit to Supine Supine to Sit: Maximal Assistance - Patient - Patient 25-49% Sit to Supine: Maximal Assistance - Patient 25-49% Transfers Transfers: Sit to Stand;Stand to Sit;Stand Pivot Transfers Sit to Stand: Minimal Assistance - Patient > 75% Stand to Sit: Minimal Assistance - Patient > 75% Stand Pivot Transfers: Moderate Assistance - Patient 50 - 74% Stand Pivot Transfer Details: Verbal cues for precautions/safety;Verbal cues for gait pattern;Verbal cues for sequencing;Verbal cues for technique Stand Pivot Transfer Details (indicate cue type and reason): pt demonstrating poor sequencing and premature sitting Transfer (Assistive  device): 1 person hand held assist Locomotion  Gait Ambulation: Yes Gait Assistance: 2 Helpers Gait Distance (Feet): 180 Feet Gait Assistance Details: Manual facilitation for weight shifting;Verbal cues for gait pattern;Verbal cues for precautions/safety;Verbal cues for technique;Verbal cues for sequencing Gait Assistance Details: assist for weightshifting to promote improved foot clearance Gait Gait: Yes Gait Pattern: Impaired Gait Pattern: Ataxic;Decreased stride length;Decreased hip/knee flexion - right;Decreased hip/knee flexion - left Gait velocity: decreased Stairs / Additional Locomotion Stairs: Yes Stairs Assistance: Moderate Assistance - Patient 50 - 74% (completed in //bars) Stair Management Technique: Two rails Number of Stairs: 1 Height of Stairs: 4 Wheelchair Mobility Wheelchair Mobility: Yes Wheelchair Assistance: Total Assistance - Patient <25% Wheelchair Propulsion: Both lower extermities Wheelchair Parts Management: Needs assistance   Discharge Criteria: Patient will be discharged from PT if patient refuses treatment 3 consecutive times without medical reason, if treatment goals not met, if there is a change in medical status, if patient makes no progress towards goals or if patient is discharged from hospital.  The above assessment, treatment plan, treatment alternatives and goals were discussed and mutually agreed upon: No family available/patient unable  Reche Ohara PT, DPT 06/15/2024, 3:33 PM

## 2024-06-15 NOTE — Plan of Care (Signed)
  Problem: RH Cognition - SLP Goal: RH LTG Patient will demonstrate orientation with cues Description:  LTG:  Patient will demonstrate orientation to person/place/time/situation with cues (SLP)   Flowsheets (Taken 06/15/2024 1536) LTG Patient will demonstrate orientation to:  Person  Place  Time  Situation LTG: Patient will demonstrate orientation using cueing (SLP): Minimal Assistance - Patient > 75%   Problem: RH Comprehension Communication Goal: LTG Patient will comprehend basic/complex auditory (SLP) Description: LTG: Patient will comprehend basic/complex auditory information with cues (SLP). Flowsheets (Taken 06/15/2024 1536) LTG: Patient will comprehend: Basic auditory information LTG: Patient will comprehend auditory information with cueing (SLP): Minimal Assistance - Patient > 75%   Problem: RH Expression Communication Goal: LTG Patient will increase speech intelligibility (SLP) Description: LTG: Patient will increase speech intelligibility at word/phrase/conversation level with cues, % of the time (SLP) Flowsheets (Taken 06/15/2024 1536) LTG: Patient will increase speech intelligibility (SLP): Supervision Level: Conversation level   Problem: RH Memory Goal: LTG Patient will use memory compensatory aids to (SLP) Description: LTG:  Patient will use memory compensatory aids to recall biographical/new, daily complex information with cues (SLP) Flowsheets (Taken 06/15/2024 1536) LTG: Patient will use memory compensatory aids to (SLP): Moderate Assistance - Patient 50 - 74%   Problem: RH Attention Goal: LTG Patient will demonstrate this level of attention during functional activites (SLP) Description: LTG:  Patient will will demonstrate this level of attention during functional activites (SLP) Flowsheets (Taken 06/15/2024 1536) Patient will demonstrate during cognitive/linguistic activities the attention type of: Sustained Patient will demonstrate this level of attention during  cognitive/linguistic activities in: Controlled LTG: Patient will demonstrate this level of attention during cognitive/linguistic activities with assistance of (SLP): Minimal Assistance - Patient > 75%   Problem: RH Awareness Goal: LTG: Patient will demonstrate awareness during functional activites type of (SLP) Description: LTG: Patient will demonstrate awareness during functional activites type of (SLP) Flowsheets (Taken 06/15/2024 1536) Patient will demonstrate during cognitive/linguistic activities awareness type of: Emergent LTG: Patient will demonstrate awareness during cognitive/linguistic activities with assistance of (SLP): Supervision   Problem: RH Swallowing Goal: LTG Patient will consume least restrictive diet using compensatory strategies with assistance (SLP) Description: LTG:  Patient will consume least restrictive diet using compensatory strategies with assistance (SLP) Flowsheets (Taken 06/15/2024 1537) LTG: Pt Patient will consume least restrictive diet using compensatory strategies with assistance of (SLP): Supervision

## 2024-06-15 NOTE — Plan of Care (Signed)
  Problem: Safety: Goal: Non-violent Restraint(s) Outcome: Progressing   Problem: Safety: Goal: Non-violent Restraint(s) Outcome: Progressing   Problem: Consults Goal: RH STROKE PATIENT EDUCATION Description: See Patient Education module for education specifics  Outcome: Not Progressing   Problem: RH BOWEL ELIMINATION Goal: RH STG MANAGE BOWEL WITH ASSISTANCE Description: STG Manage Bowel with with min Assistance. Outcome: Not Progressing Goal: RH STG MANAGE BOWEL W/MEDICATION W/ASSISTANCE Description: STG Manage Bowel with Medication with min Assistance. Outcome: Not Progressing   Problem: RH BLADDER ELIMINATION Goal: RH STG MANAGE BLADDER WITH ASSISTANCE Description: STG Manage Bladder With min Assistance Outcome: Not Progressing   Problem: RH SAFETY Goal: RH STG ADHERE TO SAFETY PRECAUTIONS W/ASSISTANCE/DEVICE Description: STG Adhere to Safety Precautions With min Assistance/Device. Outcome: Not Progressing   Problem: RH COGNITION-NURSING Goal: RH STG USES MEMORY AIDS/STRATEGIES W/ASSIST TO PROBLEM SOLVE Description: STG Uses Memory Aids/Strategies With supervision Assistance to Problem Solve. Outcome: Not Progressing   Problem: RH PAIN MANAGEMENT Goal: RH STG PAIN MANAGED AT OR BELOW PT'S PAIN GOAL Description: Pain < 4 with prns Outcome: Not Progressing   Problem: RH KNOWLEDGE DEFICIT Goal: RH STG INCREASE KNOWLEDGE OF HYPERTENSION Description: Patient and wife will be able to manage HTN using educational resources for medications and dietary modification independently Outcome: Not Progressing Goal: RH STG INCREASE KNOWLEDGE OF DYSPHAGIA/FLUID INTAKE Description: Patient and wife will be able to manage dysphagia using educational resources for medications and dietary modification independently Outcome: Not Progressing Goal: RH STG INCREASE KNOWLEGDE OF HYPERLIPIDEMIA Description: Patient and wife will be able to manage HLD using educational resources for  medications and dietary modification independently Outcome: Not Progressing Goal: RH STG INCREASE KNOWLEDGE OF STROKE PROPHYLAXIS Description: Patient and wife will be able to manage secondary risks using educational resources for medications and dietary modification independently Outcome: Not Progressing

## 2024-06-15 NOTE — Plan of Care (Signed)
  Problem: RH Balance Goal: LTG Patient will maintain dynamic standing with ADLs (OT) Description: LTG:  Patient will maintain dynamic standing balance with assist during activities of daily living (OT)  Flowsheets (Taken 06/15/2024 1215) LTG: Pt will maintain dynamic standing balance during ADLs with: Contact Guard/Touching assist   Problem: RH Eating Goal: LTG Patient will perform eating w/assist, cues/equip (OT) Description: LTG: Patient will perform eating with assist, with/without cues using equipment (OT) Flowsheets (Taken 06/15/2024 1215) LTG: Pt will perform eating with assistance level of: Supervision/Verbal cueing   Problem: RH Grooming Goal: LTG Patient will perform grooming w/assist,cues/equip (OT) Description: LTG: Patient will perform grooming with assist, with/without cues using equipment (OT) Flowsheets (Taken 06/15/2024 1215) LTG: Pt will perform grooming with assistance level of: Supervision/Verbal cueing   Problem: RH Bathing Goal: LTG Patient will bathe all body parts with assist levels (OT) Description: LTG: Patient will bathe all body parts with assist levels (OT) Flowsheets (Taken 06/15/2024 1215) LTG: Pt will perform bathing with assistance level/cueing: Minimal Assistance - Patient > 75%   Problem: RH Dressing Goal: LTG Patient will perform upper body dressing (OT) Description: LTG Patient will perform upper body dressing with assist, with/without cues (OT). Flowsheets (Taken 06/15/2024 1215) LTG: Pt will perform upper body dressing with assistance level of: Supervision/Verbal cueing Goal: LTG Patient will perform lower body dressing w/assist (OT) Description: LTG: Patient will perform lower body dressing with assist, with/without cues in positioning using equipment (OT) Flowsheets (Taken 06/15/2024 1215) LTG: Pt will perform lower body dressing with assistance level of: Minimal Assistance - Patient > 75%   Problem: RH Toileting Goal: LTG Patient will perform  toileting task (3/3 steps) with assistance level (OT) Description: LTG: Patient will perform toileting task (3/3 steps) with assistance level (OT)  Flowsheets (Taken 06/15/2024 1215) LTG: Pt will perform toileting task (3/3 steps) with assistance level: Minimal Assistance - Patient > 75%   Problem: RH Functional Use of Upper Extremity Goal: LTG Patient will use RT/LT upper extremity as a (OT) Description: LTG: Patient will use right/left upper extremity as a stabilizer/gross assist/diminished/nondominant/dominant level with assist, with/without cues during functional activity (OT) Flowsheets (Taken 06/15/2024 1215) LTG: Use of upper extremity in functional activities: RUE as dominant level LTG: Pt will use upper extremity in functional activity with assistance level of: Supervision/Verbal cueing   Problem: RH Toilet Transfers Goal: LTG Patient will perform toilet transfers w/assist (OT) Description: LTG: Patient will perform toilet transfers with assist, with/without cues using equipment (OT) Flowsheets (Taken 06/15/2024 1215) LTG: Pt will perform toilet transfers with assistance level of: Contact Guard/Touching assist   Problem: RH Tub/Shower Transfers Goal: LTG Patient will perform tub/shower transfers w/assist (OT) Description: LTG: Patient will perform tub/shower transfers with assist, with/without cues using equipment (OT) Flowsheets (Taken 06/15/2024 1215) LTG: Pt will perform tub/shower stall transfers with assistance level of: Minimal Assistance - Patient > 75%   Problem: RH Attention Goal: LTG Patient will demonstrate this level of attention during functional activites (OT) Description: LTG:  Patient will demonstrate this level of attention during functional activites  (OT) Flowsheets (Taken 06/15/2024 1215) Patient will demonstrate this level of attention during functional activites: Sustained Patient will demonstrate above attention level in the following environment: Home LTG:  Patient will demonstrate this level of attention during functional activites (OT): Supervision

## 2024-06-15 NOTE — Progress Notes (Addendum)
 Patient remained restless and confused throughout the shift. Patient pulled out his IV; site assessed with no signs of bleeding.

## 2024-06-15 NOTE — Progress Notes (Signed)
 Patient refused his breathing treatment this morning. PA aware

## 2024-06-15 NOTE — Progress Notes (Signed)
 PHARMACIST - PHYSICIAN COMMUNICATION  DR:   Carilyn  CONCERNING: IV to Oral Route Change Policy  RECOMMENDATION: This patient is receiving thiamine  by the intravenous route.  Based on criteria approved by the Pharmacy and Therapeutics Committee, the intravenous medication(s) is/are being converted to the equivalent oral dose form(s).   DESCRIPTION: These criteria include: The patient is eating (either orally or via tube) and/or has been taking other orally administered medications for a least 24 hours The patient has no evidence of active gastrointestinal bleeding or impaired GI absorption (gastrectomy, short bowel, patient on TNA or NPO).  If you have questions about this conversion, please contact the Pharmacy Department  []   [phone] )  [facility] []   [phone] )  [facility] [x]   [phone] )  [facility] []   [phone] )  [facility] []   [phone] )  [facility]

## 2024-06-15 NOTE — Evaluation (Addendum)
 Speech Language Pathology Assessment and Plan  Patient Details  Name: JSAON YOO MRN: [id number] Date of Birth: 1956/06/18  SLP Diagnosis: Cognitive Impairments;Dysarthria  Rehab Potential: Good ELOS: 14-16 days    Today's Date: 06/15/2024 SLP Individual Time: 0900-1000 SLP Individual Time Calculation (min): 60 min   Hospital Problem: Principal Problem:   Left middle cerebral artery stroke [facility])  Past Medical History:  Past Medical History:  Diagnosis Date   Aortic atherosclerosis (HCC)    Cervical spinal stenosis    Chronic kidney disease    COPD (chronic obstructive pulmonary disease) (HCC)    Coronary artery calcification seen on CT scan    ETOH abuse    a.) 28+ standard drinks/week   GERD (gastroesophageal reflux disease)    History of amiodarone  therapy    a.) in the past; self discontinued   History of medication noncompliance    a.) reported 10/2022 that he has been off NOAC for several months secondary to vertiginous symptoms, not acting like himself, and depression; b.) off/not taking as prescribed: antiarrhythmics, antihypertensives, statins, and MDIs   Hyperlipidemia    Hypertension    Long term current use of anticoagulant    a.) rivaroxaban    PAF (paroxysmal atrial fibrillation) (HCC)    a.) CHA2DS2VASc = 3 (age, HTN, vascular disease history);  b.) rate/rhythm maintained on oral carvediolol (self discontinued diltiazem  + amiodarone  + metoprolol ); chronically anticoagulated with rivaroxaban  (switched from apixaban  11/17/2022)   Tobacco abuse    Past Surgical History:  Past Surgical History:  Procedure Laterality Date   SHOULDER ARTHROSCOPY WITH SUBACROMIAL DECOMPRESSION, ROTATOR CUFF REPAIR AND BICEP TENDON REPAIR Left 12/07/2022   Procedure: SHOULDER ARTHROSCOPY WITH DEBRIDEMENT, DECOMPRESSION, ROTATOR CUFF REPAIR AND BICEPS TENODESIS.- RNFA;  Surgeon: Edie Norleen PARAS, MD;  Location: [facility] ORS;  Service: Orthopedics;  Laterality: Left;   SHOULDER  ARTHROSCOPY WITH SUBACROMIAL DECOMPRESSION, ROTATOR CUFF REPAIR AND BICEP TENDON REPAIR Left 02/02/2023   Procedure: LEFT SHOULDER ARTHROSCOPY WITH DEBRIDEMENT AND REPAIR OF RECURRENT ROTATOR CUFF TEAR OF LEFT SHOULDER;  Surgeon: Edie Norleen PARAS, MD;  Location: [facility] ORS;  Service: Orthopedics;  Laterality: Left;   TONSILLECTOMY      Assessment / Plan / Recommendation Clinical Impression  Zyeir Dymek. Voth is a 68 year old right-handed male with history significant for hypertension, hepatitis C, hyperlipidemia, paroxysmal atrial fibrillation noncompliant with anticoagulation, GERD, CKD with creatinine baseline 0.94-1.53, COPD followed by pulmonary services, alcohol/tobacco use. Per chart review patient lives with spouse. 1 level home one-step to entry. Modified independent prior to admission and reportedly still working. Presented 06/06/2024 with altered mental status and right side weakness. Cranial CT scan showed subtle hypoattenuation in the anterior left insula concerning for acute infarct. Moderate chronic microvascular ischemic disease. CTA with occluded proximal left M3 MCA. Critical nearly occlusive stenosis of left carotid bifurcation. Degree of stenosis was difficult to quantify given the degree of bulky calcific atherosclerosis but likely at 80%. Approximately 55% stenosis of the right ICA. Patient did not receive tPA. MRI showed multiple acute infarcts in the left MCA and left PCA territories, including left basal ganglia, left posterior limb of the internal capsule, left anterior insula and overlying left frontal and parietal lobes in the left occipital lobe. No mass effect. Admission chemistries positive cocaine benzos and amphetamines, alcohol negative, creatinine 1.53, total bilirubin 2.0. Echocardiogram with ejection fraction of 60 to 65% no wall motion abnormalities grade 1 diastolic dysfunction. Vascular surgery Dr. [name] for symptomatic left carotid stenosis and at this time wished to hold on  any  surgical intervention until mental status improved and this could be addressed as an outpatient. He was cleared to begin aspirin  and Plavix  for CVA prophylaxis. Subcutaneous Lovenox  for DVT prophylaxis. Currently on dysphagia #2 thin liquid diet. Therapy evaluations completed due to patient decreased functional mobility was admitted for a comprehensive rehab program    Cognitive-Linguistic Evaluation  Pt was seen for skilled ST evaluation at bedside. Pt was lying awake in enclosure bed upon SLP arrival. Session was limited, as pt was tangential with altered mental status. Pt did attempt to answer basic questions, but required several repetitions. SLP attempted to administer SLUMs. See scores below.   [place] Mental Status (SLUMS Examination)  Orientation: 0/3 Delayed Recall w/ Interference: 0/5 Numeric Calculation and Registration: 0/3 Immediate Recall w/ Interference (Generative naming): 1/3 Registration and Digit Span: 0/2 Visual Spatial/Exec Functioning: 1/6 Executive Functioning/Extrapolation:  4/8 Total: 6/30 (unable to determine if pt graduated highschool, if not, score is out of 27)  Cognition is noted to be severely impaired. Unable to fully identify presence of language impairment due to severity of cognition. Monitor as cognition improves.   Swallowing Pt not agreeable to solid trials this date. Pt was seen with x1 sip of water from straw- no overt s/sx observed. He refused medication in apple sauce while NSG was in room, spitting his pills from his mouth. He became agitated with NSG, SLP assisted to diffuse situation. Pt was able to be redirected easily. Rec continuation of current diet, with skilled observation at meal time.   Speech Pt's speech intelligibility was judged to be ~50% to an unknown listener given limited context. When asked to speak louder, pt's intelligibility improved.   Pt would benefit from skilled ST services to further maximize functional communication.      Skilled Therapeutic Interventions          Cognitive-linguistic evaluation  SLP Assessment  Patient will need skilled Speech Lanaguage Pathology Services during CIR admission    Recommendations  Medication Administration: Whole meds with liquid Supervision: Staff to assist with self feeding;Full supervision/cueing for compensatory strategies Compensations: Minimize environmental distractions;Slow rate;Small sips/bites Postural Changes and/or Swallow Maneuvers: Seated upright 90 degrees Oral Care Recommendations: Oral care BID;Staff/trained caregiver to provide oral care Patient destination: Home Follow up Recommendations: Outpatient SLP Equipment Recommended: None recommended by SLP    SLP Frequency 3 to 5 out of 7 days   SLP Duration  SLP Intensity  SLP Treatment/Interventions 14-16 days  Minumum of 1-2 x/day, 30 to 90 minutes  Cognitive remediation/compensation;Internal/external aids;Environmental controls;Multimodal communication approach;Speech/Language facilitation;Cueing hierarchy;Functional tasks;Dysphagia/aspiration precaution training;Patient/family education;Therapeutic Activities    Pain Pain Assessment Pain Scale: 0-10 Pain Score: 0-No pain  Prior Functioning Type of Home: House  Lives With: Spouse Available Help at Discharge: Family;Available 24 hours/day  SLP Evaluation Cognition Overall Cognitive Status: No family/caregiver present to determine baseline cognitive functioning Arousal/Alertness: Awake/alert Orientation Level: Oriented to person Year: Other (Comment) Month: May Day of Week: Incorrect Attention: Sustained Sustained Attention: Impaired Sustained Attention Impairment: Verbal basic;Functional basic Memory: Impaired Memory Impairment: Decreased recall of new information Awareness: Impaired Awareness Impairment: Intellectual impairment Problem Solving: Impaired Problem Solving Impairment: Functional basic Executive Function:  (impaired  2/2 lower level deficits) Behaviors: Restless;Verbal agitation;Confabulation;Poor frustration tolerance;Impulsive Safety/Judgment: Impaired  Comprehension Auditory Comprehension Overall Auditory Comprehension: Impaired Yes/No Questions: Impaired Basic Biographical Questions: 76-100% accurate Basic Immediate Environment Questions: 0-24% accurate Complex Questions: 0-24% accurate Commands: Impaired One Step Basic Commands: 25-49% accurate Conversation: Simple Interfering Components: Attention EffectiveTechniques: Extra processing time;Repetition  Visual Recognition/Discrimination Discrimination: Not tested Reading Comprehension Reading Status: Not tested Expression Expression Primary Mode of Expression: Verbal Verbal Expression Overall Verbal Expression: Appears within functional limits for tasks assessed Written Expression Dominant Hand: Right Oral Motor Oral Motor/Sensory Function Overall Oral Motor/Sensory Function: Mild impairment Facial ROM: Reduced right;Suspected CN VII (facial) dysfunction Facial Symmetry: Abnormal symmetry right;Suspected CN VII (facial) dysfunction Facial Strength: Reduced right;Suspected CN VII (facial) dysfunction Facial Sensation: Within Functional Limits Lingual ROM: Within Functional Limits Lingual Symmetry: Within Functional Limits Lingual Strength: Within Functional Limits Lingual Sensation: Within Functional Limits Motor Speech Overall Motor Speech: Impaired Respiration: Within functional limits Phonation: Normal Resonance: Within functional limits Articulation: Impaired Level of Impairment: Phrase Intelligibility: Intelligibility reduced Word: 25-49% accurate Phrase: 25-49% accurate Sentence: 25-49% accurate Conversation: 25-49% accurate Motor Planning: Within functional limits  Care Tool Care Tool Cognition Ability to hear (with hearing aid or hearing appliances if normally used Ability to hear (with hearing aid or hearing  appliances if normally used): 0. Adequate - no difficulty in normal conservation, social interaction, listening to TV   Expression of Ideas and Wants Expression of Ideas and Wants: 2. Frequent difficulty - frequently exhibits difficulty with expressing needs and ideas   Understanding Verbal and Non-Verbal Content Understanding Verbal and Non-Verbal Content: 2. Sometimes understands - understands only basic conversations or simple, direct phrases. Frequently requires cues to understand  Memory/Recall Ability Memory/Recall Ability : None of the above were recalled   PMSV Assessment  PMSV Trial Intelligibility: Intelligibility reduced Word: 25-49% accurate Phrase: 25-49% accurate Sentence: 25-49% accurate Conversation: 25-49% accurate  Bedside Swallowing Assessment General    Oral Care Assessment   Ice Chips Ice chips: Not tested Thin Liquid Thin Liquid: Within functional limits Presentation: Straw Nectar Thick Nectar Thick Liquid: Not tested Honey Thick Honey Thick Liquid: Not tested Puree Puree: Not tested Presentation: Spoon Other Comments: Refused; seen with 1 bite of puree for meds. Solid Solid: Not tested Other Comments: Refused PO BSE Assessment Risk for Aspiration Impact on safety and function: Mild aspiration risk Other Related Risk Factors: Cognitive impairment  Short Term Goals: Week 1: SLP Short Term Goal 1 (Week 1): Pt will orient to date using external aid with modA verbal cues SLP Short Term Goal 2 (Week 1): Pt will answer basic yes/no questions with modA  verbal cues SLP Short Term Goal 3 (Week 1): Pt will sustain attention to participate in further cognitive assessment SLP Short Term Goal 4 (Week 1): Pt will tolerate least restrictive diet with minA verbal cues  Refer to Care Plan for Long Term Goals  Recommendations for other services: None   Discharge Criteria: Patient will be discharged from SLP if patient refuses treatment 3 consecutive times  without medical reason, if treatment goals not met, if there is a change in medical status, if patient makes no progress towards goals or if patient is discharged from hospital.  The above assessment, treatment plan, treatment alternatives and goals were discussed and mutually agreed upon: by patient  Lacinda KANDICE Lorenzo 06/15/2024, 3:24 PM

## 2024-06-16 DIAGNOSIS — K5901 Slow transit constipation: Secondary | ICD-10-CM

## 2024-06-16 DIAGNOSIS — I10 Essential (primary) hypertension: Secondary | ICD-10-CM | POA: Diagnosis not present

## 2024-06-16 DIAGNOSIS — R451 Restlessness and agitation: Secondary | ICD-10-CM

## 2024-06-16 DIAGNOSIS — I63512 Cerebral infarction due to unspecified occlusion or stenosis of left middle cerebral artery: Secondary | ICD-10-CM | POA: Diagnosis not present

## 2024-06-16 NOTE — Progress Notes (Signed)
 Physical Therapy Session Note  Patient Details  Name: Ronald Lowe MRN: [id number] Date of Birth: [birth year]  Today's Date: 06/16/2024 PT Individual Time: 1300-1350 PT Individual Time Calculation (min): 50 min  and Today's Date: 06/16/2024 PT Missed Time: 10 Minutes Missed Time Reason: Patient fatigue  Short Term Goals: Week 1:  PT Short Term Goal 1 (Week 1): Pt will complete bed mobility with min assist consistently PT Short Term Goal 2 (Week 1): Pt will complete transfers with min assist consistently PT Short Term Goal 3 (Week 1): Pt will ambulate 150' with min assist consistently PT Short Term Goal 4 (Week 1): Pt will complete up/down 4 steps with BHRs min assist  Skilled Therapeutic Interventions/Progress Updates:      Pt supine in bed upon arrival. Pt agreeable to therapy. Pt denies any pain.   Vitals assessed: BP 126/89 HR 93  Pt in veil bed. Pt performed supine to sit with supervision.   Pt demos significant impulsivity throughout session. Pt reports need to use bathroom x3 however upon sitting on toilet unable.   Sit to stand throughout session  with min A. Ambulation 3x~30 feet with R HHA and +2 for WC follow as needed intermittently.   Pt required max verbal cues for navigating turn for ambulatory transfers to [facility], bed, and WC.   While seated pt matched 9 playing cards to by color, number and suit. With demonstration and max verbal cues pt able to appropriately identify the item 50% of the time. Pt demonstrates increased difficulty identifying difference between suits but pt able to approriately identify numbers.   Pt matched appropriate colored clip, and squigz to matching colored dot, however pt verbalized each of the colors as black initially. Pt reports being color blind.   Pt supine in veil bed at end of session with all needs within reach.     Therapy Documentation Precautions:  Precautions Precautions: Fall, Other (comment) Recall of  Precautions/Restrictions: Impaired Precaution/Restrictions Comments: posey belt Restrictions Weight Bearing Restrictions Per Provider Order: No  Therapy/Group: Individual Therapy  [facility] [place], [place], DPT  06/16/2024, 8:02 AM

## 2024-06-16 NOTE — Plan of Care (Signed)
  Problem: Consults Goal: RH STROKE PATIENT EDUCATION Description: See Patient Education module for education specifics  Outcome: Progressing   Problem: RH BOWEL ELIMINATION Goal: RH STG MANAGE BOWEL WITH ASSISTANCE Description: STG Manage Bowel with with min Assistance. Outcome: Progressing Goal: RH STG MANAGE BOWEL W/MEDICATION W/ASSISTANCE Description: STG Manage Bowel with Medication with min Assistance. Outcome: Progressing   Problem: RH BLADDER ELIMINATION Goal: RH STG MANAGE BLADDER WITH ASSISTANCE Description: STG Manage Bladder With min Assistance Outcome: Progressing   Problem: RH SAFETY Goal: RH STG ADHERE TO SAFETY PRECAUTIONS W/ASSISTANCE/DEVICE Description: STG Adhere to Safety Precautions With min Assistance/Device. Outcome: Progressing   Problem: RH COGNITION-NURSING Goal: RH STG USES MEMORY AIDS/STRATEGIES W/ASSIST TO PROBLEM SOLVE Description: STG Uses Memory Aids/Strategies With supervision Assistance to Problem Solve. Outcome: Progressing   Problem: RH PAIN MANAGEMENT Goal: RH STG PAIN MANAGED AT OR BELOW PT'S PAIN GOAL Description: Pain < 4 with prns Outcome: Progressing   Problem: RH KNOWLEDGE DEFICIT Goal: RH STG INCREASE KNOWLEDGE OF HYPERTENSION Description: Patient and wife will be able to manage HTN using educational resources for medications and dietary modification independently Outcome: Progressing Goal: RH STG INCREASE KNOWLEDGE OF DYSPHAGIA/FLUID INTAKE Description: Patient and wife will be able to manage dysphagia using educational resources for medications and dietary modification independently Outcome: Progressing Goal: RH STG INCREASE KNOWLEGDE OF HYPERLIPIDEMIA Description: Patient and wife will be able to manage HLD using educational resources for medications and dietary modification independently Outcome: Progressing Goal: RH STG INCREASE KNOWLEDGE OF STROKE PROPHYLAXIS Description: Patient and wife will be able to manage secondary  risks using educational resources for medications and dietary modification independently Outcome: Progressing   Problem: Safety: Goal: Non-violent Restraint(s) Outcome: Progressing

## 2024-06-16 NOTE — Progress Notes (Addendum)
 PROGRESS NOTE   Subjective/Complaints:  Pt doing fine but wife at bedside today and VERY upset about enclosure bed. She was unaware that he would be placed in a cage. Discussed this at length, wife understanding and agreeable with plan to reassess tomorrow and see if weaning this is an option. Nursing reporting although he's not showing much impulsiveness, he does at times show mild agitation and isn't always redirectable.   Pt states he slept ok. Denies pain. LBM yesterday per documentation, pt wasn't sure. States he's urinating ok, but urinary incontinence reported. No other complaints or concerns but pt cognition limits history.   ROS: limited by cognition/speech.   Objective:   No results found. No results for input(s): WBC, HGB, HCT, PLT in the last 72 hours.  No results for input(s): NA, K, CL, CO2, GLUCOSE, BUN, CREATININE, CALCIUM  in the last 72 hours.       Intake/Output Summary (Last 24 hours) at 06/16/2024 0920 Last data filed at 06/15/2024 1800 Gross per 24 hour  Intake 100 ml  Output --  Net 100 ml        Physical Exam: Vital Signs Blood pressure (!) 151/107, pulse 89, temperature 98.4 F (36.9 C), temperature source Oral, resp. rate 18, weight 64.8 kg, SpO2 99%.  Constitution: Appropriate appearance for age. No apparent distress, resting in enclosure bed with wife at bedside.  Resp: No respiratory distress. No accessory muscle usage. on RA and CTAB Cardio: Well perfused appearance. No peripheral edema. RRR, no m/r/g Abdomen: Nondistended. Nontender.  Soft, +BS throughout Psych: Appropriate mood and affect, though difficult to understand.  Skin: PIV intact LUE Neuro: dysarthria noted  PRIOR EXAMS:  Neuro: AAOx3; not time with cues + concentration and memory deficits Can spell world backwards, cannot add change. Cannot compare objects. dysarthria; mostly fluent speech.     DTRs: Reflexes were 2+ in bilateral achilles, patella, biceps, BR and triceps. Babinsky: flexor responses b/l.   Hoffmans: negative b/l Sensory exam: revealed normal sensation in all dermatomal regions in bilateral upper extremities and bilateral lower extremities Motor exam: Poor participation; grossly 4+/5 RUE and RLE; 5/5 LUE and LLE Coordination: Fine motor coordination was normal.         Neurological:     Comments: Patient is alert and makes eye contact with examiner.  Speech is dysarthric.  He was able to answer some basic questions with some delay in processing and very limited medical historian.  He was able to name 2/4 and able to repeat with dysarthric speech.   Assessment/Plan: 1. Functional deficits which require 3+ hours per day of interdisciplinary therapy in a comprehensive inpatient rehab setting. Physiatrist is providing close team supervision and 24 hour management of active medical problems listed below. Physiatrist and rehab team continue to assess barriers to discharge/monitor patient progress toward functional and medical goals  Care Tool:  Bathing    Body parts bathed by patient: Face, Right arm   Body parts bathed by helper: Right arm, Left arm, Face, Left lower leg, Right lower leg, Left upper leg, Right upper leg, Buttocks, Front perineal area, Abdomen, Chest     Bathing assist Assist Level: Total Assistance - Patient <  25%     Upper Body Dressing/Undressing Upper body dressing   What is the patient wearing?: Pull over shirt    Upper body assist Assist Level: Maximal Assistance - Patient 25 - 49%    Lower Body Dressing/Undressing Lower body dressing      What is the patient wearing?: Pants, Underwear/pull up     Lower body assist Assist for lower body dressing: Total Assistance - Patient < 25%     Toileting Toileting    Toileting assist Assist for toileting: Total Assistance - Patient < 25%     Transfers Chair/bed transfer  Transfers  assist     Chair/bed transfer assist level: Moderate Assistance - Patient 50 - 74%     Locomotion Ambulation   Ambulation assist      Assist level: 2 helpers Assistive device: Hand held assist Max distance: 180'   Walk 10 feet activity   Assist     Assist level: 2 helpers Assistive device: Hand held assist   Walk 50 feet activity   Assist    Assist level: 2 helpers Assistive device: Hand held assist    Walk 150 feet activity   Assist    Assist level: 2 helpers Assistive device: Hand held assist    Walk 10 feet on uneven surface  activity   Assist Walk 10 feet on uneven surfaces activity did not occur: Safety/medical concerns         Wheelchair     Assist Is the patient using a wheelchair?: Yes Type of Wheelchair: Manual    Wheelchair assist level: Total Assistance - Patient < 25% Max wheelchair distance: 150'    Wheelchair 50 feet with 2 turns activity    Assist        Assist Level: Total Assistance - Patient < 25%   Wheelchair 150 feet activity     Assist      Assist Level: Total Assistance - Patient < 25%   Blood pressure (!) 151/107, pulse 89, temperature 98.4 F (36.9 C), temperature source Oral, resp. rate 18, weight 64.8 kg, SpO2 99%.  Medical Problem List and Plan: 1. Functional deficits secondary to left MCA scattered infarct likely etiology due to symptomatic proximal ICA stenosis             -patient may  shower             -ELOS/Goals: 14-16 days, Min A PT/OT/SLP             -Continue CIR   2.  Antithrombotics: -DVT/anticoagulation:  Pharmaceutical: Lovenox  40mg  daily  -antiplatelet therapy: Aspirin  81 mg daily and Plavix  75 mg daily until after left carotid surgery  3. Pain Management: Tylenol  as needed 4. Mood/Behavior/Sleep/alcohol withdrawal/delirium: Provide emotional support             -antipsychotic agents: seroquel  25 mg at bedtime + BID PRN              - Veil bed for high fall risk,  attempting OOB, confusion              - delirium precautions              - add sleep log, trazodone 50 mg at bedtime PRN for sleep -06/16/24 pt's wife very upset about enclosure bed, myself and Scientist, research (physical sciences) at bedside to explain situation, wife understanding and more agreeable -hasn't shown much impulsivity but is not easily redirectable yet and does show some agitated behaviors at times -if appropriate,  would consider weaning enclosure bed this week   5. Neuropsych/cognition: This patient is not capable of making decisions on his own behalf. 6. Skin/Wound Care: Routine skin checks 7. Fluids/Electrolytes/Nutrition: Routine in and outs with follow-up chemistries -Continue vitamins and supplements 8.  Dysphagia.  Dysphagia #2 thin liquids.  Follow-up speech therapy 9.  COPD/tobacco use.  Continue inhalers.  NicoDerm patch.  Provide counseling 10.  PAF.  Noncompliant anticoagulation.  Cardiac rate controlled 11.  History of alcohol use.  Monitor for any signs of withdrawal.  Provide counseling -06/15/24 pt on IV thiamine -- weekday team to address if this can be switched to PO at this point. Will leave for now since this was part of his delirium tx  12.  AKI/CKD.  Follow-up chemistries Monday 13.  GERD.  Protonix  40mg  daily 14.  Hypertension.  Patient on lisinopril  5 mg day prior to admission but noncompliant.  Monitor with increased mobility  -06/15/24 BP fine until this morning-- monitor for trend -06/16/24 BP a bit up today, but monitor trend, if continued to be elevated then would increase lisinopril .  Vitals:   06/14/24 1555 06/14/24 2044 06/15/24 0442 06/15/24 1420  BP: 111/75 128/86 (!) 146/98 (!) 158/97   06/15/24 2214 06/16/24 0326  BP: (!) 158/96 (!) 151/107    15.  Symptomatic left ICA stenosis.  Follow-up outpatient vascular surgery Dr. [name] 16. Bowel management: pt unsure of LBM but seems to have documented incontinence but no description of BMs so unclear if this is an  error-- last actual documented BM was 6/25.  -06/15/24 for now, leave meds off, but will ask nursing to please document his BMs -06/16/24 LBM yesterday, monitor   I spent >8mins performing patient care related activities, including prolonged face to face time, documentation time, discussion of enclosure bed with patient and nursing staff, and overall coordination of care.   LOS: 2 days A FACE TO FACE EVALUATION WAS PERFORMED  [address] 06/16/2024, 9:20 AM

## 2024-06-16 NOTE — Plan of Care (Signed)
  Problem: RH SAFETY Goal: RH STG ADHERE TO SAFETY PRECAUTIONS W/ASSISTANCE/DEVICE Description: STG Adhere to Safety Precautions With min Assistance/Device. Outcome: Not Progressing   Problem: RH PAIN MANAGEMENT Goal: RH STG PAIN MANAGED AT OR BELOW PT'S PAIN GOAL Description: Pain < 4 with prns Outcome: Progressing   Problem: Safety: Goal: Non-violent Restraint(s) Outcome: Not Progressing

## 2024-06-16 NOTE — Progress Notes (Signed)
 Speech Language Pathology Daily Session Note  Patient Details  Name: Ronald Lowe MRN: [id number] Date of Birth: [birth year]  Today's Date: 06/16/2024 SLP Individual Time: 1100-1202 SLP Individual Time Calculation (min): 62 min  Short Term Goals: Week 1: SLP Short Term Goal 1 (Week 1): Pt will orient to date using external aid with modA verbal cues SLP Short Term Goal 2 (Week 1): Pt will answer basic yes/no questions with modA  verbal cues SLP Short Term Goal 3 (Week 1): Pt will sustain attention to participate in further cognitive assessment SLP Short Term Goal 4 (Week 1): Pt will tolerate least restrictive diet with minA verbal cues  Skilled Therapeutic Interventions:  Pt seen for ST targeting dysphagia management and cognitive-linguistic goals. Pt alert lying in enclosure bed with wife, [name], present. She reports the pt has been crying more today due to worrying about her. Pt denied pain. Pt oriented to situation but not to place, month, or year. He had improved attention this date compared to eval note from yesterday. SLP facilitated PO trials to further assess dysphagia and risk for aspiration/choking. Prolonged mastication and mild-mod lingual residue observed with regular texture graham cracker.  Delayed cough x2 noted across trials with thin water via straw but may have been d/t positioning. An occasional baseline cough was noted as well. Although pt was able to chew regular texture trial effectively, a more challenging food may not be safe and the pt continuously laid back while eating and required direct instruction to remain upright during PO, placing him at an increased risk for choking. Recommend continue Dys 2/thin with full supervision at this time and continue dysphagia management.   SLP then facilitated cognitive-linguistic tasks targeting orientation, command following, and answering biographical questions. Pt answered questions correctly in ~50% of opportunities even when  naming family members. His wife provided the following history: 2 sons Deward ODESSIA Cornet, stepdaughter Kenney, 4 grandchildren, and a cat named Maury. 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 He followed 1-step directions with 100% acc IND and 2-step directions with 33% acc when provided max VC. He had 0% acc using an external memory aid to orient himself to the date when provided errorless learning. Wife estimates speech intelligibility to be ~80% for her. At baseline, he did not take his meds but IND managed his bills/appointments etc. She does report baseline STM concerns in the past year. Pt left in enclosure bed with call bell in reach and Nursing present. Continue ST POC.   Pain Pain Assessment Pain Scale: 0-10 Pain Score: 0-No pain Faces Pain Scale: No hurt  Therapy/Group: Individual Therapy  Waddell JONETTA Novak, MA CCC-SLP 06/16/2024, 12:22 PM

## 2024-06-16 NOTE — Progress Notes (Signed)
 Occupational Therapy Session Note  Patient Details  Name: Ronald Lowe MRN: [id number] Date of Birth: November 26, 1956  Today's Date: 06/16/2024 OT Individual Time: [phone] OT Individual Time Calculation (min): 67 min  8 mins missed d/t delay in care   Short Term Goals: Week 1:  OT Short Term Goal 1 (Week 1): Pt will complete UB bathing with min A OT Short Term Goal 2 (Week 1): Pt will complete toilet transfers with min A OT Short Term Goal 3 (Week 1): Pt will attend to functional task in quiet environment for >1 min with SUP OT Short Term Goal 4 (Week 1): Pt will participate in additional vision assessment with SUP  Skilled Therapeutic Interventions/Progress Updates:  Pt greeted supine in veil bed, pt agreeable to OT intervention.    Wife present during session.   Transfers/bed mobility/functional mobility: pt completed supine>sit with MIN A to elevate trunk. Pt completed stand pivot to w/c with no AD and MIN- MODA d/t impulsivity. Pt completed sit>stands with no AD with MINA.   Therapeutic activity: pt completed dynamic standing balance task with pt instructed to stand from mat table and reach dynamically to remove squigz from mirror to challenge reactive balance and further assess RUE coordination. Pt with ataxic reach in RUE but was able to remove squigz from mirror in standing with MIN A d/t posterior lean.   Graded task up and attempted to have pt place squigz on mirror in a specific spot to challenge targeted reaching with pt unable to motor plan how to place squigz on mirror needing hand over hand assist to complete task from sitting and standing.    ADLs:  Grooming: pt was able to wash his face at sink with MAX A for sequencing.  UB dressing:donned [place] shirt with MAX A  LB dressing: donned pants from EOB with MAX A d/t pt having difficulty with orientation of pants. Pt stood with MODA to pull pants to waist line.    Bathing: wife preferred to assist with bathing with pt needing  overall total A  d/t impaired attention, problem solving and R inattention.    Education: discussed current DME at home and anticipated goals at DC with wife while pt received breathing treatment.   Cognition: pt disoriented throughout session and following commands with + time and max multimodal cues.                 Ended session with pt seated in w/c with all needs within reach and posey belt donned, wife present and nurse aware.                  Therapy Documentation Precautions:  Precautions Precautions: Fall, Other (comment) Recall of Precautions/Restrictions: Impaired Precaution/Restrictions Comments: posey belt Restrictions Weight Bearing Restrictions Per Provider Order: No  Pain: No pain indicated.    Therapy/Group: Individual Therapy  Ronald Lowe 06/16/2024, 12:20 PM

## 2024-06-17 DIAGNOSIS — R4701 Aphasia: Secondary | ICD-10-CM

## 2024-06-17 DIAGNOSIS — I63512 Cerebral infarction due to unspecified occlusion or stenosis of left middle cerebral artery: Secondary | ICD-10-CM | POA: Diagnosis not present

## 2024-06-17 DIAGNOSIS — I6522 Occlusion and stenosis of left carotid artery: Secondary | ICD-10-CM

## 2024-06-17 DIAGNOSIS — I10 Essential (primary) hypertension: Secondary | ICD-10-CM | POA: Diagnosis not present

## 2024-06-17 LAB — CBC WITH DIFFERENTIAL/PLATELET
Abs Immature Granulocytes: 0.13 10*3/uL — ABNORMAL HIGH (ref 0.00–0.07)
Basophils Absolute: 0.1 10*3/uL (ref 0.0–0.1)
Basophils Relative: 2 %
Eosinophils Absolute: 0.2 10*3/uL (ref 0.0–0.5)
Eosinophils Relative: 3 %
HCT: 40.8 % (ref 39.0–52.0)
Hemoglobin: 14.6 g/dL (ref 13.0–17.0)
Immature Granulocytes: 2 %
Lymphocytes Relative: 18 %
Lymphs Abs: 1 10*3/uL (ref 0.7–4.0)
MCH: 37.1 pg — ABNORMAL HIGH (ref 26.0–34.0)
MCHC: 35.8 g/dL (ref 30.0–36.0)
MCV: 103.6 fL — ABNORMAL HIGH (ref 80.0–100.0)
Monocytes Absolute: 0.9 10*3/uL (ref 0.1–1.0)
Monocytes Relative: 16 %
Neutro Abs: 3.2 10*3/uL (ref 1.7–7.7)
Neutrophils Relative %: 59 %
Platelets: 319 10*3/uL (ref 150–400)
RBC: 3.94 MIL/uL — ABNORMAL LOW (ref 4.22–5.81)
RDW: 13.1 % (ref 11.5–15.5)
WBC: 5.4 10*3/uL (ref 4.0–10.5)
nRBC: 0 % (ref 0.0–0.2)

## 2024-06-17 LAB — COMPREHENSIVE METABOLIC PANEL WITH GFR
ALT: 28 U/L (ref 0–44)
AST: 38 U/L (ref 15–41)
Albumin: 3.6 g/dL (ref 3.5–5.0)
Alkaline Phosphatase: 42 U/L (ref 38–126)
Anion gap: 10 (ref 5–15)
BUN: 12 mg/dL (ref 8–23)
CO2: 25 mmol/L (ref 22–32)
Calcium: 9.1 mg/dL (ref 8.9–10.3)
Chloride: 101 mmol/L (ref 98–111)
Creatinine, Ser: 1.1 mg/dL (ref 0.61–1.24)
GFR, Estimated: >60 mL/min (ref >60)
Glucose, Bld: 94 mg/dL (ref 70–99)
Potassium: 3.7 mmol/L (ref 3.5–5.1)
Sodium: 136 mmol/L (ref 135–145)
Total Bilirubin: 1.1 mg/dL (ref 0.0–1.2)
Total Protein: 6.6 g/dL (ref 6.5–8.1)

## 2024-06-17 NOTE — Progress Notes (Addendum)
 PROGRESS NOTE   Subjective/Complaints:  Sitting at nurses station , aphasic, confused, thinks he's in ER, cannot tell me he's had a stroke   ROS: limited by cognition/speech.   Objective:   No results found. Recent Labs    06/17/24 0524  WBC 5.4  HGB 14.6  HCT 40.8  PLT 319    Recent Labs    06/17/24 0524  NA 136  K 3.7  CL 101  CO2 25  GLUCOSE 94  BUN 12  CREATININE 1.10  CALCIUM  9.1         Intake/Output Summary (Last 24 hours) at 06/17/2024 1058 Last data filed at 06/17/2024 0744 Gross per 24 hour  Intake 544 ml  Output --  Net 544 ml        Physical Exam: Vital Signs Blood pressure 134/68, pulse 63, temperature 98 F (36.7 C), resp. rate 16, weight 64.8 kg, SpO2 99%.  Constitution: Appropriate appearance for age. No apparent distress, resting in enclosure bed with wife at bedside.  Resp: No respiratory distress. No accessory muscle usage. on RA and CTAB Cardio: Well perfused appearance. No peripheral edema. RRR, no m/r/g Abdomen: Nondistended. Nontender.  Soft, +BS throughout Psych: Appropriate mood and affect, though difficult to understand.  Skin: PIV intact LUE Neuro: dysarthria noted  PRIOR EXAMS:  Neuro: AAOx2; not time with cues  dysarthria; mostly fluent speech. Needs repetition to follow simple commands    DTRs: Reflexes were 2+ in bilateral achilles, patella, biceps, BR and triceps.  Sensory exam: difficult to assess due to aphasia and reduced concentration  Motor exam: Poor participation; grossly 4+/5 RUE and RLE; 5/5 LUE and LLE Coordination: ataxic RUE        Neurological:     Comments: Patient is alert and makes eye contact with examiner.  Speech is dysarthric.  He was able to answer some basic questions with some delay in processing and very limited medical historian.  Assessment/Plan: 1. Functional deficits which require 3+ hours per day of interdisciplinary therapy  in a comprehensive inpatient rehab setting. Physiatrist is providing close team supervision and 24 hour management of active medical problems listed below. Physiatrist and rehab team continue to assess barriers to discharge/monitor patient progress toward functional and medical goals  Care Tool:  Bathing    Body parts bathed by patient: Face   Body parts bathed by helper: Right arm, Left arm, Face, Left lower leg, Right lower leg, Left upper leg, Right upper leg, Buttocks, Front perineal area, Abdomen, Chest     Bathing assist Assist Level: Total Assistance - Patient < 25%     Upper Body Dressing/Undressing Upper body dressing   What is the patient wearing?: Pull over shirt    Upper body assist Assist Level: Maximal Assistance - Patient 25 - 49%    Lower Body Dressing/Undressing Lower body dressing      What is the patient wearing?: Pants, Underwear/pull up     Lower body assist Assist for lower body dressing: Total Assistance - Patient < 25%     Toileting Toileting    Toileting assist Assist for toileting: Total Assistance - Patient < 25%     Transfers Chair/bed transfer  Transfers assist     Chair/bed transfer assist level: Moderate Assistance - Patient 50 - 74%     Locomotion Ambulation   Ambulation assist      Assist level: 2 helpers Assistive device: Hand held assist Max distance: 180'   Walk 10 feet activity   Assist     Assist level: 2 helpers Assistive device: Hand held assist   Walk 50 feet activity   Assist    Assist level: 2 helpers Assistive device: Hand held assist    Walk 150 feet activity   Assist    Assist level: 2 helpers Assistive device: Hand held assist    Walk 10 feet on uneven surface  activity   Assist Walk 10 feet on uneven surfaces activity did not occur: Safety/medical concerns         Wheelchair     Assist Is the patient using a wheelchair?: Yes Type of Wheelchair: Manual    Wheelchair  assist level: Total Assistance - Patient < 25% Max wheelchair distance: 150'    Wheelchair 50 feet with 2 turns activity    Assist        Assist Level: Total Assistance - Patient < 25%   Wheelchair 150 feet activity     Assist      Assist Level: Total Assistance - Patient < 25%   Blood pressure 134/68, pulse 63, temperature 98 F (36.7 C), resp. rate 16, weight 64.8 kg, SpO2 99%.  Medical Problem List and Plan: 1. Functional deficits secondary to left MCA scattered infarct likely etiology due to symptomatic proximal ICA stenosis             -patient may  shower             -ELOS/Goals: 14-16 days, Min A PT/OT/SLP             -Continue CIR   2.  Antithrombotics: -DVT/anticoagulation:  Pharmaceutical: Lovenox  40mg  daily  -antiplatelet therapy: Aspirin  81 mg daily and Plavix  75 mg daily until after left carotid surgery  3. Pain Management: Tylenol  as needed 4. Mood/Behavior/Sleep/alcohol withdrawal/delirium: Provide emotional support             -antipsychotic agents: seroquel  25 mg at bedtime + BID PRN              - Veil bed for high fall risk, attempting OOB, confusion              - delirium precautions              - add sleep log, trazodone 50 mg at bedtime PRN for sleep -06/16/24 pt's wife very upset about enclosure bed, myself and Scientist, research (physical sciences) at bedside to explain situation, wife understanding and more agreeable -hasn't shown much impulsivity but is not easily redirectable yet and does show some agitated behaviors at times -if appropriate, would consider weaning enclosure bed this week   5. Neuropsych/cognition: This patient is not capable of making decisions on his own behalf. 6. Skin/Wound Care: Routine skin checks 7. Fluids/Electrolytes/Nutrition: Routine in and outs with follow-up chemistries -Continue vitamins and supplements 8.  Dysphagia.  Dysphagia #2 thin liquids.  Follow-up speech therapy 9.  COPD/tobacco use.  Continue inhalers.  NicoDerm  patch.  Provide counseling 10.  PAF.  Noncompliant anticoagulation.  Cardiac rate controlled 11.  History of alcohol use.  Monitor for any signs of withdrawal.  Provide counseling -06/15/24 pt on IV thiamine -- weekday team to address if this can be switched to  PO at this point. Will leave for now since this was part of his delirium tx  12.  AKI/CKD.  Follow-up chemistries Monday 13.  GERD.  Protonix  40mg  daily 14.  Hypertension.  Patient on lisinopril  5 mg day prior to admission but noncompliant.  Monitor with increased mobility  -06/15/24 BP fine until this morning-- monitor for trend -06/16/24 BP a bit up today, but monitor trend, if continued to be elevated then would increase lisinopril .  Vitals:   06/14/24 1555 06/14/24 2044 06/15/24 0442 06/15/24 1420  BP: 111/75 128/86 (!) 146/98 (!) 158/97   06/15/24 2214 06/16/24 0326 06/16/24 1306 06/16/24 1900  BP: (!) 158/96 (!) 151/107 126/89 (!) 160/87   06/17/24 0622  BP: 134/68    15.  Symptomatic left ICA stenosis.  Follow-up outpatient vascular surgery Dr. [name] 16. Bowel management: pt unsure of LBM but seems to have documented incontinence but no description of BMs so unclear if this is an error-- last actual documented BM was 6/25.  -06/15/24 for now, leave meds off, but will ask nursing to please document his BMs -06/16/24 LBM yesterday, monitor   LOS: 3 days A FACE TO FACE EVALUATION WAS PERFORMED  Prentice FORBES Compton 06/17/2024, 10:58 AM

## 2024-06-17 NOTE — Plan of Care (Signed)
  Problem: Consults Goal: RH STROKE PATIENT EDUCATION Description: See Patient Education module for education specifics  Outcome: Progressing   Problem: RH BOWEL ELIMINATION Goal: RH STG MANAGE BOWEL WITH ASSISTANCE Description: STG Manage Bowel with with min Assistance. Outcome: Progressing Goal: RH STG MANAGE BOWEL W/MEDICATION W/ASSISTANCE Description: STG Manage Bowel with Medication with min Assistance. Outcome: Progressing   Problem: RH BLADDER ELIMINATION Goal: RH STG MANAGE BLADDER WITH ASSISTANCE Description: STG Manage Bladder With min Assistance Outcome: Progressing   Problem: RH SAFETY Goal: RH STG ADHERE TO SAFETY PRECAUTIONS W/ASSISTANCE/DEVICE Description: STG Adhere to Safety Precautions With min Assistance/Device. Outcome: Progressing   Problem: RH COGNITION-NURSING Goal: RH STG USES MEMORY AIDS/STRATEGIES W/ASSIST TO PROBLEM SOLVE Description: STG Uses Memory Aids/Strategies With supervision Assistance to Problem Solve. Outcome: Progressing

## 2024-06-17 NOTE — Progress Notes (Signed)
  Progress Note    06/17/2024 8:15 AM * No surgery found *  Subjective:  confused.  Eating breakfast at the front desk.   Vitals:   06/16/24 1900 06/17/24 0622  BP: (!) 160/87 134/68  Pulse: 86 63  Resp: 16 16  Temp: 98.2 F (36.8 C) 98 F (36.7 C)  SpO2: 96% 99%   Physical Exam: Lungs:  non labored Extremities:  moving ext well Neurologic: confused; alert but not oriented  CBC    Component Value Date/Time   WBC 5.4 06/17/2024 0524   RBC 3.94 (L) 06/17/2024 0524   HGB 14.6 06/17/2024 0524   HGB 15.8 07/01/2022 1039   HCT 40.8 06/17/2024 0524   HCT 45.1 07/01/2022 1039   PLT 319 06/17/2024 0524   PLT 280 07/01/2022 1039   MCV 103.6 (H) 06/17/2024 0524   MCV 103 (H) 07/01/2022 1039   MCH 37.1 (H) 06/17/2024 0524   MCHC 35.8 06/17/2024 0524   RDW 13.1 06/17/2024 0524   RDW 13.7 07/01/2022 1039   LYMPHSABS 1.0 06/17/2024 0524   LYMPHSABS 0.8 07/01/2022 1039   MONOABS 0.9 06/17/2024 0524   EOSABS 0.2 06/17/2024 0524   EOSABS 0.2 07/01/2022 1039   BASOSABS 0.1 06/17/2024 0524   BASOSABS 0.1 07/01/2022 1039    BMET    Component Value Date/Time   NA 136 06/17/2024 0524   NA 133 (L) 07/01/2022 1039   K 3.7 06/17/2024 0524   CL 101 06/17/2024 0524   CO2 25 06/17/2024 0524   GLUCOSE 94 06/17/2024 0524   BUN 12 06/17/2024 0524   BUN 7 (L) 07/01/2022 1039   CREATININE 1.10 06/17/2024 0524   CALCIUM  9.1 06/17/2024 0524   GFRNONAA >60 06/17/2024 0524   GFRAA 58 (L) 09/08/2020 0910    INR    Component Value Date/Time   INR 1.0 06/06/2024 2212     Intake/Output Summary (Last 24 hours) at 06/17/2024 0815 Last data filed at 06/17/2024 0100 Gross per 24 hour  Intake 534 ml  Output --  Net 534 ml     Assessment/Plan:  68 y.o. male with symptomatic L ICA stenosis  Patient is alert but not oriented.  Pleasantly confused this morning eating breakfast at the nurses' desk.  He will likely follow up with Dr. Wilbern an outpatient to further discuss left  carotid endarterectomy.  Continue aspirin  and plavix .   Donnice Sender, PA-C [facility] [phone] 06/17/2024 8:15 AM

## 2024-06-17 NOTE — Progress Notes (Signed)
 Speech Language Pathology Daily Session Note  Patient Details  Name: Ronald Lowe MRN: [id number] Date of Birth: [birth year]  Today's Date: 06/17/2024 SLP Individual Time: 1003-1059 SLP Individual Time Calculation (min): 56 min  Short Term Goals: Week 1: SLP Short Term Goal 1 (Week 1): Pt will orient to date using external aid with modA verbal cues SLP Short Term Goal 2 (Week 1): Pt will answer basic yes/no questions with modA  verbal cues SLP Short Term Goal 3 (Week 1): Pt will sustain attention to participate in further cognitive assessment SLP Short Term Goal 4 (Week 1): Pt will tolerate least restrictive diet with minA verbal cues  Skilled Therapeutic Interventions:  Patient was seen in am to address cognitive re- training and speech intelligibility. Pt alert and received seated at nurses station. He appropriately responded to clinician greeting and was agreeable for transition to speech therapy office. Assigned nurse requesting to present meds thus pt returned to his room where he accepted meds crushed in puree and consumed thin liquid via cup edge without difficulty. Once in speech office, pt presented with D3 trials, noted increase in impulsivity and restlessness noted with pt reaching for multiple objects, undoing alarm belt and attempting to stand. Pt warranting max cues with temporary successful redirection. Pt consuming D3 solids with mildly prolonged but perceived functional mastication c/b complete oral clearance. He was without s/s aspiration. Pt may benefit from additional trials of D3 consistencies to assess potential for diet upgrade. As restlessness and impulsivity persisted change of scenery completed. While maneuvering through halls, pt identified upcoming holiday given cues to attend to decorations. Given biographical information he answered yes/ no questions with mod A. Pt returned to nurses station at conclusion of session. SLP to continue POC.   Pain Pain Assessment Pain  Scale: 0-10 Pain Score: 0-No pain  Therapy/Group: Individual Therapy  Joane GORMAN Fuss 06/17/2024, 12:52 PM

## 2024-06-17 NOTE — Progress Notes (Signed)
 Occupational Therapy Session Note  Patient Details  Name: Ronald Lowe MRN: [id number] Date of Birth: [birth year]  Today's Date: 06/17/2024 OT Individual Time: [phone] OT Individual Time Calculation (min): 72 min    Short Term Goals: Week 1:  OT Short Term Goal 1 (Week 1): Pt will complete UB bathing with min A OT Short Term Goal 2 (Week 1): Pt will complete toilet transfers with min A OT Short Term Goal 3 (Week 1): Pt will attend to functional task in quiet environment for >1 min with SUP OT Short Term Goal 4 (Week 1): Pt will participate in additional vision assessment with SUP  Skilled Therapeutic Interventions/Progress Updates:   Pt greeted at nurses station, pt agreeable to OT intervention.      Transfers/bed mobility/functional mobility: pt completed functional ambulation with R HHA with MINA greater than a household distance and w/c follow.   Therapeutic activity:  Pt completed various therapeutic activities with a focus on improving RUE motor planning, R sided attention, problem solving and attention to task.   -pt completed seated/standing nuts/bolts task with pt instructed to screw nuts on bolts with RUE. Pt unable to complete task in standing d/t increased demand therefore downgraded task to siting however pt unable to complete task without MAXA. Pt in semi distracting environment and required max cues to attend to task.  -pt completed seated color matching task with pt instructed to match colored letters to colored dots with pt completing task with ~ 20 % accuracy.  -pt completed seated functioanl reaching task with BITS with pt instructed to reach to BITS screen to tap stimulus on screen with RUE. Pt completed task with hand over hand assist with 37% accuracy, in 10 mins with 27.98 sec reaction time. Pt noted to undershoot and over shoot reaching task.  - pt completed seated clothespin task with pt instructed to remove clothespins from line with RUE. Pt completed task with  max multimodal cues noted to over shoot and undershoot with RUE. Attempted to don clothespin on line however pt unable to motor plan task with RUE. -pt completed seated ball tosses to challenge reactionary balance with pt completing task with close supervision no true LOB but noted to wobble when catching ball. Had pt count reps to increase attention to task and problem solving.   ADLs:  Transfers: ambulatory toilet transfer with Hand held [facility] Toileting: pt completed 3/3 toileting tasks with MINA d/t impulsivity, continent urine void.    Cognition: pt disoriented throughout session asking where his dog is and pretending to smoke cigarettes, pt attempted to eat flat discs in gym.                 Ended session with pt seated at nurses station. Therapy Documentation Precautions:  Precautions Precautions: Fall, Other (comment) Recall of Precautions/Restrictions: Impaired Precaution/Restrictions Comments: posey belt Restrictions Weight Bearing Restrictions Per Provider Order: No  Pain: no pain indicated    Therapy/Group: Individual Therapy  Ronald Lowe [facility] 06/17/2024, 12:08 PM

## 2024-06-17 NOTE — Progress Notes (Signed)
 Physical Therapy Session Note  Patient Details  Name: Ronald Lowe MRN: [id number] Date of Birth: 01-17-1956  Today's Date: 06/17/2024 PT Individual Time: 1400-1406 PT Individual Time Calculation (min): 6 min  And Today's Date: 06/17/2024 PT Individual Time: 8458-8371 PT Individual Time Calculation (min): 47 min And Today's Date: 06/17/2024 PT Missed Time: 7 Minutes Missed Time Reason: Patient fatigue   Short Term Goals: Week 1:  PT Short Term Goal 1 (Week 1): Pt will complete bed mobility with min assist consistently PT Short Term Goal 2 (Week 1): Pt will complete transfers with min assist consistently PT Short Term Goal 3 (Week 1): Pt will ambulate 150' with min assist consistently PT Short Term Goal 4 (Week 1): Pt will complete up/down 4 steps with BHRs min assist  Skilled Therapeutic Interventions/Progress Updates:  Patient supine in veil bed and asleep on entrance to room. Patient initially not waking to verbal stimulus. And allowed to sleep. Returned in and pt still sleeping, not aroused with verbal cues. Pt noted to shiver and covered with blanket.   Returned later in afternoon and pt alert and initially agreeable to PT session.   Patient with no pain complaint at start of session. Seated upright in veil bed with legs crossed and facing across bed to door.   Therapeutic Activity: Bed Mobility: Pt performed mobility within veil bed with supervision/ mod I. He is able to lie down and then sit back up requiring no physical help.   Attempted to encourage pt to leave bed to walk in unit and pt agreeable, but then does not move to sit on EOB. Attempt to have pt initiate move to EOB with presentation of shoes on floor and asking to help pt don shoes. Pt is able to lean forward and look at shoes but is unable to initiate movement toward completing task of donning shoes.   Never pulling on pt's arms or attempting to provide physical assist to help pt out of bed. Pt does engage  in conversations and even relates that he wishes he could walk up to the door. Informed pt that this therapist would ambulate with him for increased safety but pt never initiates move to EOB. Encouraged several other ways to leave bed but pt unaware/ unable to move to EOB.   Patient sidelying in veil bed at end of session with brakes locked, no alarm set, veil bed zippered up, and all needs within reach. Related to pt that dinner arrival is soon and RN/ NT would get patient up and into w/c so that he can be supervised for meal. Pt agreeable.    Therapy Documentation Precautions:  Precautions Precautions: Fall, Other (comment) Recall of Precautions/Restrictions: Impaired Precaution/Restrictions Comments: posey belt Restrictions Weight Bearing Restrictions Per Provider Order: No  Pain: Pain Assessment Pain Scale: 0-10 Pain Score: 0-No pain  Therapy/Group: Individual Therapy  Mliss DELENA Milliner 06/17/2024, 2:07 PM

## 2024-06-17 NOTE — Progress Notes (Signed)
 Inpatient Rehabilitation  Patient information reviewed and entered into eRehab system by [facility]. Karen Kays., CCC/SLP, PPS Coordinator.  Information including medical coding, functional ability and quality indicators will be reviewed and updated through discharge.

## 2024-06-17 NOTE — IPOC Note (Signed)
 Overall Plan of Care [facility]) Patient Details Name: Ronald Lowe MRN: [id number] DOB: 12-29-1955  Admitting Diagnosis: Left middle cerebral artery stroke [facility])  Hospital Problems: Principal Problem:   Left middle cerebral artery stroke [facility])     Functional Problem List: Nursing Pain, Safety, Bladder, Bowel, Endurance, Medication Management, Nutrition  PT Behavior, Balance, Endurance, Motor, Perception, Safety, Sensory  OT Balance, Behavior, Cognition, Endurance, Motor, Perception, Safety, Sensory, Vision  SLP Cognition  TR         Basic ADL's: OT Grooming, Bathing, Dressing, Toileting     Advanced  ADL's: OT       Transfers: PT Bed Mobility, Bed to Chair, Banker, Toilet     Locomotion: PT Ambulation, Stairs, Wheelchair Mobility     Additional Impairments: OT Fuctional Use of Upper Extremity  SLP Communication, Social Cognition comprehension Memory, Attention, Awareness, Problem Solving, Social Interaction  TR      Anticipated Outcomes Item Anticipated Outcome  Self Feeding SUP  Swallowing  spv   Basic self-care  Min A  Toileting  Min A   Bathroom Transfers CGA  Bowel/Bladder  manage bowel and bladder w mod I assist  Transfers  CGA  Locomotion  CGA ambulatory  Communication  minA  Cognition  modA  Pain  Pain < 4 wtih prns  Safety/Judgment  manage safety w cues   Therapy Plan: PT Intensity: Minimum of 1-2 x/day ,45 to 90 minutes PT Frequency: 5 out of 7 days PT Duration Estimated Length of Stay: 2-3 weeks OT Intensity: Minimum of 1-2 x/day, 45 to 90 minutes OT Frequency: 5 out of 7 days OT Duration/Estimated Length of Stay: 14-16 days SLP Intensity: Minumum of 1-2 x/day, 30 to 90 minutes SLP Frequency: 3 to 5 out of 7 days SLP Duration/Estimated Length of Stay: 14-16 days   Team Interventions: Nursing Interventions Patient/Family Education, Pain Management, Dysphagia/Aspiration Precaution Training, Discharge Planning, Medication  Management, Bladder Management, Bowel Management, Disease Management/Prevention, Cognitive Remediation/Compensation  PT interventions Ambulation/gait training, Disease management/prevention, Stair training, Pain management, Visual/perceptual remediation/compensation, Warden/ranger, DME/adaptive equipment instruction, Patient/family education, Therapeutic Activities, Cognitive remediation/compensation, Psychosocial support, Therapeutic Exercise, Community reintegration, Functional mobility training, Skin care/wound management, UE/LE Strength taining/ROM, Discharge planning, Neuromuscular re-education, UE/LE Coordination activities  OT Interventions Balance/vestibular training, Self Care/advanced ADL retraining, UE/LE Coordination activities, Cognitive remediation/compensation, Functional mobility training, Skin care/wound managment, Visual/perceptual remediation/compensation, Community reintegration, Neuromuscular re-education, Splinting/orthotics, Wheelchair propulsion/positioning, Discharge planning, Pain management, Therapeutic Activities, Disease mangement/prevention, Patient/family education, Therapeutic Exercise, DME/adaptive equipment instruction, Psychosocial support, UE/LE Strength taining/ROM  SLP Interventions Cognitive remediation/compensation, Internal/external aids, Environmental controls, Multimodal communication approach, Speech/Language facilitation, Cueing hierarchy, Functional tasks, Dysphagia/aspiration precaution training, Patient/family education, Therapeutic Activities  TR Interventions    SW/CM Interventions     Barriers to Discharge MD  Medical stability  Nursing Lack of/limited family support, Home environment access/layout, Incontinence, Nutrition means, Neurogenic Bowel & Bladder, Medication compliance 1 level 2 ste bil rail w spouse;Additionally family concerned about increasing episodes of confusion, possible underlying dementia, complicated by his heavy alcohol  use.  PT Decreased caregiver support, Inaccessible home environment, Home environment access/layout, Behavior, Incontinence level of assist at this time exceeds abilities of caregiver  OT      SLP      SW       Team Discharge Planning: Destination: PT-Home ,OT- Home , SLP-Home Projected Follow-up: PT-Outpatient PT, OT-  Home health OT, SLP-Outpatient SLP Projected Equipment Needs: PT-To be determined, OT- To be determined, SLP-None recommended by SLP Equipment Details:  PT- , OT-  Patient/family involved in discharge planning: PT- Patient unable/family or caregiver not available,  OT-Patient, SLP-Patient  MD ELOS: 10-14d Medical Rehab Prognosis:  Fair Assessment: The patient has been admitted for CIR therapies with the diagnosis of Left MCA infarct. The team will be addressing functional mobility, strength, stamina, balance, safety, adaptive techniques and equipment, self-care, bowel and bladder mgt, patient and caregiver education, confusion. Goals have been set at Supervision . Anticipated discharge destination is home .        See Team Conference Notes for weekly updates to the plan of care

## 2024-06-18 DIAGNOSIS — I63512 Cerebral infarction due to unspecified occlusion or stenosis of left middle cerebral artery: Secondary | ICD-10-CM | POA: Diagnosis not present

## 2024-06-18 DIAGNOSIS — I10 Essential (primary) hypertension: Secondary | ICD-10-CM | POA: Diagnosis not present

## 2024-06-18 DIAGNOSIS — R4701 Aphasia: Secondary | ICD-10-CM | POA: Diagnosis not present

## 2024-06-18 NOTE — Progress Notes (Addendum)
 Physical Therapy Session Note  Patient Details  Name: Ronald Lowe MRN: [id number] Date of Birth: 04-16-1956  Today's Date: 06/18/2024 PT Individual Time: 0847-0930 PT Individual Time Calculation (min): 43 min   Short Term Goals: Week 1:  PT Short Term Goal 1 (Week 1): Pt will complete bed mobility with min assist consistently PT Short Term Goal 2 (Week 1): Pt will complete transfers with min assist consistently PT Short Term Goal 3 (Week 1): Pt will ambulate 150' with min assist consistently PT Short Term Goal 4 (Week 1): Pt will complete up/down 4 steps with BHRs min assist  Skilled Therapeutic Interventions/Progress Updates: Patient supine in bed with wife present on entrance to room. Patient alert and agreeable to PT session.   Patient with no complaints of pain during session. Beginning of session focused on building pt rapport. Pt stated desire to go home and frustration being here, but pleasant throughout session. Pt required assistance with recall of reason being here due to stroke vs pt report of having had a heart attack. PTA further emphasized rationale of pt having to be in veil bed to pt and pt wife due to impulsiveness and safety with pt and wife seemingly more understanding after discussion.  Therapeutic Activity: Bed Mobility: Pt performed supine<sit on EOB with supervision. VC required for pt to avoid using B LE's to build momentum from rocking towards EOB to avoid kicking bar on veil bed (and to decrease risk of straining posterior chain musculature), or whomever is assisting pt OOB. Transfers: Pt performed sit<>stand transfer from EOB<WC with CGA/light minA for safety awareness. Pt also performed sit<>stand transfers throughout session in preparation for functional mobility with CGA, and VC for hand placement to control decent, and to ensure back of knees touch sitting surface as pt presents with decreased awareness to R side, and would attempt to sit at an angle. - Pt  performed self hygiene of washing face/B UE's with wash cloth appropriately without assistance while sitting in WC  Pt ambulated around nsg/day room loop x 1, and from day room<room (at end of session) using no AD with overall CGA/minA. Pt demonstrated the following gait deviations with therapist providing the described cuing and facilitation for improvement:  - Decreased BOS - R heel-strike in longer transition to achieve full heel-toe pattern - imbalances noted when turning corners due to decreased standing balance and narrow BOS  Neuromuscular Re-ed: NMR facilitated during session with focus on dynamic standing balance/coordination. - Pt performed cone taps with B LE's and minA and increased time required to coordinate step placement and cues to maintain neutral BOS. Pt also presented with motor impersistence as pt would tap top of cone, then tap the side a few reps with R LE, which required max cuing to maintain sequence.   NMR performed for improvements in motor control and coordination, balance, sequencing, judgement, and self confidence/ efficacy in performing all aspects of mobility at highest level of independence.   Patient sitting EOB at end of session with brakes locked, wife present and hand off to SLP.      Therapy Documentation Precautions:  Precautions Precautions: Fall, Other (comment) Recall of Precautions/Restrictions: Impaired Precaution/Restrictions Comments: posey belt Restrictions Weight Bearing Restrictions Per Provider Order: No  Therapy/Group: Individual Therapy  Azzam Mehra PTA 06/18/2024, 12:53 PM
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
## 2024-06-18 NOTE — Plan of Care (Signed)
  Problem: Consults Goal: RH STROKE PATIENT EDUCATION Description: See Patient Education module for education specifics  Outcome: Progressing   Problem: RH BOWEL ELIMINATION Goal: RH STG MANAGE BOWEL WITH ASSISTANCE Description: STG Manage Bowel with with min Assistance. Outcome: Progressing Goal: RH STG MANAGE BOWEL W/MEDICATION W/ASSISTANCE Description: STG Manage Bowel with Medication with min Assistance. Outcome: Progressing   Problem: RH BLADDER ELIMINATION Goal: RH STG MANAGE BLADDER WITH ASSISTANCE Description: STG Manage Bladder With min Assistance Outcome: Progressing   Problem: RH SAFETY Goal: RH STG ADHERE TO SAFETY PRECAUTIONS W/ASSISTANCE/DEVICE Description: STG Adhere to Safety Precautions With min Assistance/Device. Outcome: Progressing   Problem: RH COGNITION-NURSING Goal: RH STG USES MEMORY AIDS/STRATEGIES W/ASSIST TO PROBLEM SOLVE Description: STG Uses Memory Aids/Strategies With supervision Assistance to Problem Solve. Outcome: Progressing

## 2024-06-18 NOTE — Progress Notes (Signed)
   06/18/24 0700  Behavioral Plan Guideline  Behavior to decrease/eliminate Increase participation;Decrease impulsivity;Decrease restlessness;Decrease incontinence  Changes to environment Lights on, blinds open during the day, off and closed at night;Minimize in room stimulation;Move patient closer to nursing station;Door open when staff not in room;Drinks/food out of reach;Place signs in room to remind pt to call for assistance  Minimize in room stimulation  TV off  Interventions Enclosure bed;Bed alarm setting;Sit at nurses station for one-on-one supervision;Limit nighttime interruptions  Bed alarm setting Medium sensitivity  Recommendations for interactions with patient Remain calm and reduce environment stimulation with agitation;Redirect behaviors to alternate tasks/topics;DO NOT argue with patient;Provide gentle orientation or indirect orientation;Offer toileting with every interaction;Offer fluids every interaction;Provide breaks from restraints every interaction without agitated behaviors;Offer mobilizing in the room, on the unit, sitting at nurses' station with restlessness: see safety plan for mobility recommendation (w/c v ambulation);Offer patient preferred music  In Attendance at Behavior Plan Meeting  OT;PT;SLP;RN

## 2024-06-18 NOTE — Progress Notes (Signed)
 Occupational Therapy Session Note  Patient Details  Name: Ronald Lowe MRN: [id number] Date of Birth: [birth year]  Today's Date: 06/18/2024 OT Individual Time: 1100-1200 OT Individual Time Calculation (min): 60 min    Short Term Goals: Week 1:  OT Short Term Goal 1 (Week 1): Pt will complete UB bathing with min A OT Short Term Goal 2 (Week 1): Pt will complete toilet transfers with min A OT Short Term Goal 3 (Week 1): Pt will attend to functional task in quiet environment for >1 min with SUP OT Short Term Goal 4 (Week 1): Pt will participate in additional vision assessment with SUP  Skilled Therapeutic Interventions/Progress Updates:    Pt received in wc in room with his wife present. Pt in good spirits and very cooperative and motivated today. I explained the rehab process of seeing what exactly he could do for himself to determine what he needs to work on in therapy. Pt understood and demonstrated good motivation as he ambulated to bathroom, stood up several times, toileted, showered and dressed with only CGA in standing.  Good R side awareness and coordination.   He was oriented and he talked about the need to press the call light button when his wife is not present.  Discussed the need to work on his balance and R side strength.  Pt resting in wc in room with wife and call belt on.    Therapy Documentation Precautions:  Precautions Precautions: Fall, Other (comment) Recall of Precautions/Restrictions: Impaired Precaution/Restrictions Comments: posey belt Restrictions Weight Bearing Restrictions Per Provider Order: No      Pain: Pain Assessment Pain Score: 0-No pain ADL: ADL Eating: Supervision/safety Grooming: Supervision/safety Where Assessed-Grooming: Edge of bed Upper Body Bathing: Supervision/safety Where Assessed-Upper Body Bathing: Shower Lower Body Bathing: Contact guard Where Assessed-Lower Body Bathing: Shower Upper Body Dressing: Supervision/safety Where  Assessed-Upper Body Dressing: Chair Lower Body Dressing: Contact guard Where Assessed-Lower Body Dressing: Chair Toileting: Contact guard Where Assessed-Toileting: Teacher, adult education: Furniture conservator/restorer Method: Proofreader: Gaffer: Not assessed Film/video editor: Administrator, arts Method: Designer, industrial/product: Grab bars, Transfer tub bench   Therapy/Group: Individual Therapy  Tresa Jolley 06/18/2024, 12:49 PM

## 2024-06-18 NOTE — Progress Notes (Signed)
 Speech Language Pathology Daily Session Note  Patient Details  Name: Ronald Lowe MRN: [id number] Date of Birth: June 23, 1956  Today's Date: 06/18/2024 SLP Individual Time: 9068-8967; 1432- 1518 SLP Individual Time Calculation (min): 61 min; 46 min  Short Term Goals: Week 1: SLP Short Term Goal 1 (Week 1): Pt will orient to date using external aid with modA verbal cues SLP Short Term Goal 2 (Week 1): Pt will answer basic yes/no questions with modA  verbal cues SLP Short Term Goal 3 (Week 1): Pt will sustain attention to participate in further cognitive assessment SLP Short Term Goal 4 (Week 1): Pt will tolerate least restrictive diet with minA verbal cues  Skilled Therapeutic Interventions:  Session 1: Patient was seen in am to address dysphagia management, cognitive re- training, and speech intelligibility. Direct handoff from PT with pt alert and seated upright on EOB. His wife was present and an active participant throughout session. Pt responded appropriately to SLP greeting and able to engage in meaningful greeting dialogue. SLP noted significantly improved speech intelligibility from previous date. Pt indep oriented to hospital and stroke. He was oriented to temporal concepts given min A. Pt's wife brought in family photos thus SLP engaged pt in looking and engaging with photos to assess attention. Pt sustained attention to task for ~5 minutes. SLP further trained pt in call button with pt able to return demo use of call button and reasons to use it. Intellectual awareness observed through pt identification of balance changes post CVA.  Pt agreeable to complete session outside thus pt transferred via [facility] for remainder of session. Pt consumed Dys 3 solids demonstrating adequate oral skills with no s/s aspiration. SLP recommending diet upgrade to Dys 3 solids with continued full supervision due to impulsivity. SLP engaged pt in rapport building in order to establish trust and also to assess  cognition and speech intelligibility. Pt discussed former jobs, family, and riding motorcycles. Pt noted with imprecise articulation and decreased vocal intensity though speech intelligibility judged to be about 75% intelligible in phrases and sentences. Pt responsive to cues to increase vocal intensity or slow rate when asked. Upon navigating back to his room he recalled floor number and was able to identify some turns to take in order to locate his room.   Session 2: Pt seen in PM to address cognitive re-training. Pt was alert and in enclosure bed upon SLP arrival. He introduced this SLP to his family friend, Ronald Lowe. Pt agreeable to complete session with family friend present. Given significant improvement in attention, SLP facilitated completion of SLUMS. Pt scored a 14/30 (WNL >26). Strengths noted in orientation. Deficits noted in executive function, language, delayed and immediate recall, and mental manipulation. Though pt presents with significant improvement in cognitive function he continues to present with impairments warranting continued speech intervention. In remaining minutes of session, pt answered basic yes/no questions with 83% acc with min A. Pt assisted to bathroom at conclusion of session with min verbal cues for safety awareness. Pt left in enclosure bed. SLP to continue POC.   Pain Pain Assessment Pain Scale: 0-10 Pain Score: 0-No pain  Therapy/Group: Individual Therapy  Ronald Lowe 06/18/2024, 11:46 AM

## 2024-06-18 NOTE — Progress Notes (Signed)
 PROGRESS NOTE   Subjective/Complaints:  No issues overnight, patient remains confused but is alert speaking with wife this morning.  We discussed the location of the stroke as well as the deficits encountered with this type of stroke.  We discussed the need for the enclosure bed but the goal of return to home with 24-hour supervision without need for enclosure bed.  ROS: limited by cognition/speech.   Objective:   No results found. Recent Labs    06/17/24 0524  WBC 5.4  HGB 14.6  HCT 40.8  PLT 319    Recent Labs    06/17/24 0524  NA 136  K 3.7  CL 101  CO2 25  GLUCOSE 94  BUN 12  CREATININE 1.10  CALCIUM  9.1         Intake/Output Summary (Last 24 hours) at 06/18/2024 9187 Last data filed at 06/18/2024 0500 Gross per 24 hour  Intake 460 ml  Output 350 ml  Net 110 ml        Physical Exam: Vital Signs Blood pressure 133/88, pulse 65, temperature 98.2 F (36.8 C), resp. rate 16, weight 64.8 kg, SpO2 98%.  Oriented to person and place but not time General: No acute distress Mood and affect are appropriate Heart: Regular rate and rhythm no rubs murmurs or extra sounds Lungs: Clear to auscultation, breathing unlabored, no rales or wheezes Abdomen: Positive bowel sounds, soft nontender to palpation, nondistended Extremities: No clubbing, cyanosis, or edema Skin: No evidence of breakdown, no evidence of rash   dysarthria; mostly fluent speech. Needs repetition to follow simple commands    DTRs: Reflexes were 2+ in bilateral achilles, patella, biceps, BR and triceps.  Sensory exam: difficult to assess due to aphasia and reduced concentration  Motor exam: Poor participation; grossly 4+/5 RUE and RLE; 5/5 LUE and LLE Coordination: ataxic RUE        Neurological:     Comments: Patient is alert and makes eye contact with examiner.  Speech is dysarthric.  He was able to answer some basic questions with  some delay in processing and very limited medical historian.  Assessment/Plan: 1. Functional deficits which require 3+ hours per day of interdisciplinary therapy in a comprehensive inpatient rehab setting. Physiatrist is providing close team supervision and 24 hour management of active medical problems listed below. Physiatrist and rehab team continue to assess barriers to discharge/monitor patient progress toward functional and medical goals  Care Tool:  Bathing    Body parts bathed by patient: Face   Body parts bathed by helper: Right arm, Left arm, Face, Left lower leg, Right lower leg, Left upper leg, Right upper leg, Buttocks, Front perineal area, Abdomen, Chest     Bathing assist Assist Level: Total Assistance - Patient < 25%     Upper Body Dressing/Undressing Upper body dressing   What is the patient wearing?: Pull over shirt    Upper body assist Assist Level: Maximal Assistance - Patient 25 - 49%    Lower Body Dressing/Undressing Lower body dressing      What is the patient wearing?: Pants, Underwear/pull up     Lower body assist Assist for lower body dressing: Total Assistance - Patient <  25%     Toileting Toileting    Toileting assist Assist for toileting: Total Assistance - Patient < 25%     Transfers Chair/bed transfer  Transfers assist     Chair/bed transfer assist level: Moderate Assistance - Patient 50 - 74%     Locomotion Ambulation   Ambulation assist      Assist level: 2 helpers Assistive device: Hand held assist Max distance: 180'   Walk 10 feet activity   Assist     Assist level: 2 helpers Assistive device: Hand held assist   Walk 50 feet activity   Assist    Assist level: 2 helpers Assistive device: Hand held assist    Walk 150 feet activity   Assist    Assist level: 2 helpers Assistive device: Hand held assist    Walk 10 feet on uneven surface  activity   Assist Walk 10 feet on uneven surfaces activity  did not occur: Safety/medical concerns         Wheelchair     Assist Is the patient using a wheelchair?: Yes Type of Wheelchair: Manual    Wheelchair assist level: Total Assistance - Patient < 25% Max wheelchair distance: 150'    Wheelchair 50 feet with 2 turns activity    Assist        Assist Level: Total Assistance - Patient < 25%   Wheelchair 150 feet activity     Assist      Assist Level: Total Assistance - Patient < 25%   Blood pressure 133/88, pulse 65, temperature 98.2 F (36.8 C), resp. rate 16, weight 64.8 kg, SpO2 98%.  Medical Problem List and Plan: 1. Functional deficits secondary to left MCA scattered infarct likely etiology due to symptomatic proximal ICA stenosis             -patient may  shower             -ELOS/Goals: 14-16 days, Min A PT/OT/SLP             -Continue CIR   2.  Antithrombotics: -DVT/anticoagulation:  Pharmaceutical: Lovenox  40mg  daily  -antiplatelet therapy: Aspirin  81 mg daily and Plavix  75 mg daily until after left carotid surgery  3. Pain Management: Tylenol  as needed 4. Mood/Behavior/Sleep/alcohol withdrawal/delirium: Provide emotional support             -antipsychotic agents: seroquel  25 mg at bedtime + BID PRN              - Veil bed for high fall risk, attempting OOB, confusion              - delirium precautions              - add sleep log, trazodone 50 mg at bedtime PRN for sleep Continue enclosure bed for now discussed with care team in a.m. although I do think he is going to need this for at least another week   5. Neuropsych/cognition: This patient is not capable of making decisions on his own behalf. 6. Skin/Wound Care: Routine skin checks 7. Fluids/Electrolytes/Nutrition: Routine in and outs with follow-up chemistries -Continue vitamins and supplements 8.  Dysphagia.  Dysphagia #2 thin liquids.  Follow-up speech therapy 9.  COPD/tobacco use.  Continue inhalers.  NicoDerm patch.  Provide counseling 10.   PAF.  Noncompliant anticoagulation.  Cardiac rate controlled 11.  History of alcohol use.  Monitor for any signs of withdrawal.  Provide counseling -06/15/24 pt on IV thiamine -- weekday team to address if  this can be switched to PO at this point. Will leave for now since this was part of his delirium tx  12.  AKI/CKD.  Follow-up chemistries Monday 13.  GERD.  Protonix  40mg  daily 14.  Hypertension.  Patient on lisinopril  5 mg day prior to admission but noncompliant.  Monitor with increased mobility  Controlled 06/18/2024 Vitals:   06/14/24 1555 06/14/24 2044 06/15/24 0442 06/15/24 1420  BP: 111/75 128/86 (!) 146/98 (!) 158/97   06/15/24 2214 06/16/24 0326 06/16/24 1306 06/16/24 1900  BP: (!) 158/96 (!) 151/107 126/89 (!) 160/87   06/17/24 0622 06/17/24 1518 06/17/24 1944 06/18/24 0514  BP: 134/68 133/74 133/77 133/88    15.  Symptomatic left ICA stenosis.  Follow-up outpatient vascular surgery Dr. [name] 16. Bowel management: Now having regular continent stools   LOS: 4 days A FACE TO FACE EVALUATION WAS PERFORMED  Ronald Lowe 06/18/2024, 8:12 AM

## 2024-06-18 NOTE — Plan of Care (Signed)
  Problem: RH Balance Goal: LTG Patient will maintain dynamic standing with ADLs (OT) Description: LTG:  Patient will maintain dynamic standing balance with assist during activities of daily living (OT)  Flowsheets (Taken 06/18/2024 1246) LTG: Pt will maintain dynamic standing balance during ADLs with: Supervision/Verbal cueing   Problem: RH Bathing Goal: LTG Patient will bathe all body parts with assist levels (OT) Description: LTG: Patient will bathe all body parts with assist levels (OT) Flowsheets (Taken 06/18/2024 1246) LTG: Pt will perform bathing with assistance level/cueing: Supervision/Verbal cueing LTG: Position pt will perform bathing: Shower   Problem: RH Dressing Goal: LTG Patient will perform upper body dressing (OT) Description: LTG Patient will perform upper body dressing with assist, with/without cues (OT). Flowsheets (Taken 06/18/2024 1246) LTG: Pt will perform upper body dressing with assistance level of: Set up assist Goal: LTG Patient will perform lower body dressing w/assist (OT) Description: LTG: Patient will perform lower body dressing with assist, with/without cues in positioning using equipment (OT) Flowsheets (Taken 06/18/2024 1246) LTG: Pt will perform lower body dressing with assistance level of: Supervision/Verbal cueing   Problem: RH Toileting Goal: LTG Patient will perform toileting task (3/3 steps) with assistance level (OT) Description: LTG: Patient will perform toileting task (3/3 steps) with assistance level (OT)  Flowsheets (Taken 06/18/2024 1246) LTG: Pt will perform toileting task (3/3 steps) with assistance level: Supervision/Verbal cueing   Problem: RH Functional Use of Upper Extremity Goal: LTG Patient will use RT/LT upper extremity as a (OT) Description: LTG: Patient will use right/left upper extremity as a stabilizer/gross assist/diminished/nondominant/dominant level with assist, with/without cues during functional activity (OT) Flowsheets (Taken  06/18/2024 1246) LTG: Pt will use upper extremity in functional activity with assistance level of: Independent   Problem: RH Toilet Transfers Goal: LTG Patient will perform toilet transfers w/assist (OT) Description: LTG: Patient will perform toilet transfers with assist, with/without cues using equipment (OT) Flowsheets (Taken 06/18/2024 1246) LTG: Pt will perform toilet transfers with assistance level of: Supervision/Verbal cueing   Problem: RH Tub/Shower Transfers Goal: LTG Patient will perform tub/shower transfers w/assist (OT) Description: LTG: Patient will perform tub/shower transfers with assist, with/without cues using equipment (OT) Flowsheets (Taken 06/18/2024 1246) LTG: Pt will perform tub/shower stall transfers with assistance level of: Supervision/Verbal cueing LTG: Pt will perform tub/shower transfers from: Tub/shower combination

## 2024-06-19 DIAGNOSIS — I10 Essential (primary) hypertension: Secondary | ICD-10-CM | POA: Diagnosis not present

## 2024-06-19 DIAGNOSIS — I63512 Cerebral infarction due to unspecified occlusion or stenosis of left middle cerebral artery: Secondary | ICD-10-CM | POA: Diagnosis not present

## 2024-06-19 DIAGNOSIS — R4701 Aphasia: Secondary | ICD-10-CM | POA: Diagnosis not present

## 2024-06-19 MED ORDER — ASPIRIN 81 MG PO TBEC: 81.0000 mg | DELAYED_RELEASE_TABLET | Freq: Every day | ORAL | Status: AC

## 2024-06-19 MED ORDER — ACETAMINOPHEN 325 MG PO TABS: 650.0000 mg | ORAL_TABLET | ORAL | Status: AC | PRN

## 2024-06-19 NOTE — Progress Notes (Signed)
 Speech Language Pathology Daily Session Note  Patient Details  Name: INIOLUWA BARIS MRN: [id number] Date of Birth: [birth year]  Today's Date: 06/19/2024 SLP Individual Time: 0932-1017 SLP Individual Time Calculation (min): 45 min  Short Term Goals: Week 1: SLP Short Term Goal 1 (Week 1): Pt will orient to date using external aid with modA verbal cues SLP Short Term Goal 2 (Week 1): Pt will answer basic yes/no questions with modA  verbal cues SLP Short Term Goal 3 (Week 1): Pt will sustain attention to participate in further cognitive assessment SLP Short Term Goal 4 (Week 1): Pt will tolerate least restrictive diet with minA verbal cues  Skilled Therapeutic Interventions:  Patient was seen in am to address cognitive re- training and speech intelligibility. Pt was alert upon SLP arrival, upright in enclosure bed. Pt denied pain. Initially pt not oriented to temporal concepts or situation. Given re- training of information he later recalled temporal and situational concepts with sup A. Pt continues to demonstrate awareness of physical and cognitive deficits as indicated by commenting on participation in PT where he noted balance and endurance were poor. He also commented that his memory and speech has changed since stroke. Pt able to verbalize tasks completed in PT session prior to SLP arrival. SLP initiated SLOP speech intelligibility strategies and examples of utilization. SLP engaged pt in a structures task where pt was challenged to utilize strategies in order to communicate an unknown topic. Pt warranting mod A to achieve 75% intelligibility at the phrase level. Pt assisted to bathroom where he benefited from min A for safety awareness. Pt identified call button and its use prior to SLP exit. He was left upright in enclosure bed with call button within reach. SLP to continue POC.   Pain Pain Assessment Pain Scale: 0-10 Pain Score: 0-No pain  Therapy/Group: Individual Therapy  Joane GORMAN Fuss 06/19/2024, 11:50 AM

## 2024-06-19 NOTE — Progress Notes (Signed)
 PROGRESS NOTE   Subjective/Complaints:  Continues to require enclosure bed due to poor right sided awareness although impulsivity is improving    ROS: limited by cognition/speech.   Objective:   No results found. Recent Labs    06/17/24 0524  WBC 5.4  HGB 14.6  HCT 40.8  PLT 319    Recent Labs    06/17/24 0524  NA 136  K 3.7  CL 101  CO2 25  GLUCOSE 94  BUN 12  CREATININE 1.10  CALCIUM  9.1         Intake/Output Summary (Last 24 hours) at 06/19/2024 0830 Last data filed at 06/19/2024 0815 Gross per 24 hour  Intake 892 ml  Output 800 ml  Net 92 ml        Physical Exam: Vital Signs Blood pressure (!) 143/85, pulse 95, temperature 97.9 F (36.6 C), temperature source Oral, resp. rate 18, weight 64.8 kg, SpO2 97%.  Oriented to person and place but not time General: No acute distress Mood and affect are appropriate Heart: Regular rate and rhythm no rubs murmurs or extra sounds Lungs: Clear to auscultation, breathing unlabored, no rales or wheezes Abdomen: Positive bowel sounds, soft nontender to palpation, nondistended Extremities: No clubbing, cyanosis, or edema Skin: No evidence of breakdown, no evidence of rash   dysarthria; mostly fluent speech. Needs repetition to follow simple commands    DTRs: Reflexes were 2+ in bilateral achilles, patella, biceps, BR and triceps. Able to name pen, glasses and stethoscope Sensory exam: difficult to assess due to aphasia and reduced concentration  RIght neglect +/- HH noted  Motor exam: Poor participation; grossly 4+/5 RUE and RLE; 5/5 LUE and LLE Coordination: ataxic RUE        Neurological:     Comments: Patient is alert and makes eye contact with examiner.  Speech is dysarthric.  He was able to answer some basic questions with some delay in processing and very limited medical historian.  Assessment/Plan: 1. Functional deficits which require 3+ hours  per day of interdisciplinary therapy in a comprehensive inpatient rehab setting. Physiatrist is providing close team supervision and 24 hour management of active medical problems listed below. Physiatrist and rehab team continue to assess barriers to discharge/monitor patient progress toward functional and medical goals  Care Tool:  Bathing    Body parts bathed by patient: Right arm, Left arm, Chest, Abdomen, Front perineal area, Buttocks, Right upper leg, Face, Left lower leg, Right lower leg, Left upper leg   Body parts bathed by helper: Right arm, Left arm, Face, Left lower leg, Right lower leg, Left upper leg, Right upper leg, Buttocks, Front perineal area, Abdomen, Chest     Bathing assist Assist Level: Contact Guard/Touching assist     Upper Body Dressing/Undressing Upper body dressing   What is the patient wearing?: Pull over shirt    Upper body assist Assist Level: Supervision/Verbal cueing    Lower Body Dressing/Undressing Lower body dressing      What is the patient wearing?: Pants, Underwear/pull up     Lower body assist Assist for lower body dressing: Contact Guard/Touching assist     Toileting Toileting    Toileting assist  Assist for toileting: Contact Guard/Touching assist     Transfers Chair/bed transfer  Transfers assist     Chair/bed transfer assist level: Contact Guard/Touching assist     Locomotion Ambulation   Ambulation assist      Assist level: Minimal Assistance - Patient > 75% Assistive device: Hand held assist Max distance: 180   Walk 10 feet activity   Assist     Assist level: Minimal Assistance - Patient > 75% Assistive device: Hand held assist   Walk 50 feet activity   Assist    Assist level: Minimal Assistance - Patient > 75% Assistive device: Hand held assist    Walk 150 feet activity   Assist    Assist level: Minimal Assistance - Patient > 75% Assistive device: Hand held assist    Walk 10 feet on  uneven surface  activity   Assist Walk 10 feet on uneven surfaces activity did not occur: Safety/medical concerns         Wheelchair     Assist Is the patient using a wheelchair?: Yes Type of Wheelchair: Manual    Wheelchair assist level: Total Assistance - Patient < 25% Max wheelchair distance: 150'    Wheelchair 50 feet with 2 turns activity    Assist        Assist Level: Total Assistance - Patient < 25%   Wheelchair 150 feet activity     Assist      Assist Level: Total Assistance - Patient < 25%   Blood pressure (!) 143/85, pulse 95, temperature 97.9 F (36.6 C), temperature source Oral, resp. rate 18, weight 64.8 kg, SpO2 97%.  Medical Problem List and Plan: 1. Functional deficits secondary to left MCA scattered infarct likely etiology due to symptomatic proximal ICA stenosis             -patient may  shower             -ELOS/Goals: 14-16 days, Min A PT/OT/SLP             -Continue CIR Team conference today please see physician documentation under team conference tab, met with team  to discuss problems,progress, and goals. Formulized individual treatment plan based on medical history, underlying problem and comorbidities.    2.  Antithrombotics: -DVT/anticoagulation:  Pharmaceutical: Lovenox  40mg  daily  -antiplatelet therapy: Aspirin  81 mg daily and Plavix  75 mg daily until after left carotid surgery  3. Pain Management: Tylenol  as needed 4. Mood/Behavior/Sleep/alcohol withdrawal/delirium: Provide emotional support             -antipsychotic agents: seroquel  25 mg at bedtime + BID PRN              - Veil bed for high fall risk, attempting OOB, confusion              - delirium precautions              - add sleep log, trazodone 50 mg at bedtime PRN for sleep Continue enclosure bed for now discuss with care team today   5. Neuropsych/cognition: This patient is not capable of making decisions on his own behalf. 6. Skin/Wound Care: Routine skin  checks 7. Fluids/Electrolytes/Nutrition: Routine in and outs with follow-up chemistries -Continue vitamins and supplements 8.  Dysphagia.  Dysphagia #2 thin liquids.  Follow-up speech therapy 9.  COPD/tobacco use.  Continue inhalers.  NicoDerm patch.  Provide counseling 10.  PAF.  Noncompliant anticoagulation.  Cardiac rate controlled 11.  History of alcohol use.  Monitor  for any signs of withdrawal.  Provide counseling -06/15/24 pt on IV thiamine -- weekday team to address if this can be switched to PO at this point. Will leave for now since this was part of his delirium tx  12.  AKI/CKD.  Follow-up chemistries Monday 13.  GERD.  Protonix  40mg  daily 14.  Hypertension.  Patient on lisinopril  5 mg day prior to admission but noncompliant.  Monitor with increased mobility  Controlled 06/18/2024 Vitals:   06/15/24 1420 06/15/24 2214 06/16/24 0326 06/16/24 1306  BP: (!) 158/97 (!) 158/96 (!) 151/107 126/89   06/16/24 1900 06/17/24 0622 06/17/24 1518 06/17/24 1944  BP: (!) 160/87 134/68 133/74 133/77   06/18/24 0514 06/18/24 1248 06/18/24 1937 06/19/24 0347  BP: 133/88 107/70 (!) 141/85 (!) 143/85    15.  Symptomatic left ICA stenosis.  Follow-up outpatient vascular surgery Dr. [name] 16. Bowel management: Now having regular continent stools   LOS: 5 days A FACE TO FACE EVALUATION WAS PERFORMED  Ronald Lowe 06/19/2024, 8:30 AM

## 2024-06-19 NOTE — Progress Notes (Signed)
 Patient ID: Ronald Lowe, male   DOB: 1956/05/16, 68 y.o.   MRN: [id number]  SW met with pt in room to inform on d/c date. No family in room.   1510- SW spoke with pt wife  Ronald Lowe to provide updates from team conference, and d/c date 7/8. SW discussed family edu. Fam edu Monday 1pm-4pm.    Graeme Jude, MSW, LCSW Office: [phone] Cell: [phone] Fax: [phone]

## 2024-06-19 NOTE — Progress Notes (Signed)
 Physical Therapy Session Note  Patient Details  Name: Ronald Lowe MRN: [id number] Date of Birth: July 19, 1956  Today's Date: 06/19/2024 PT Individual Time: 0805-0902; 1305 - 1401 PT Individual Time Calculation (min): 57 min; 56 min   Short Term Goals: Week 1:  PT Short Term Goal 1 (Week 1): Pt will complete bed mobility with min assist consistently PT Short Term Goal 2 (Week 1): Pt will complete transfers with min assist consistently PT Short Term Goal 3 (Week 1): Pt will ambulate 150' with min assist consistently PT Short Term Goal 4 (Week 1): Pt will complete up/down 4 steps with BHRs min assist  SESSION 1 Skilled Therapeutic Interventions/Progress Updates: Patient standing in room with nsg present on entrance to room. Patient alert and agreeable to PT session.   Patient reported no pain during session.   Therapeutic Activity: Bed Mobility: Pt performed sit<supine from EOB with supervision. Transfers: Pt performed sit<>stand transfers throughout session with CGA and VC to increase awareness when sitting as pt will attempt to try to sit with B UE on sitting surface which leads to B LE twisting (max cuing throughout session).   Pt ambulating around room looking for hat and sat on WC with VC to increase awareness on R side as pt sand very close to edge and almost slid off.   Gait Training:  Pt ambulated from room<>day room gym using no AD with CGA/light minA. Pt demonstrated the following gait deviations with therapist providing the described cuing and facilitation for improvement:  - Decreased R LE step clearance/length with cues to increase - Decreased weight shift to R - Decreased awareness to R side (ambulating close to R side of hallway, and lightly grazing past objects on R).  Neuromuscular Re-ed: NMR facilitated during session with focus on proprioceptive feedback to R side, ambulatory balance, . - Pt ambulating around nsg/day room loop while holding tidal tank with cues to  move around with B UE's. Pt with deviated gait and moments of catching R LE on floor or on heel of L foot with light minA provided to assist but pt also demonstrated stepping strategy to maintain standing balance. Pt also cued to increase R foot step/clearance. Pt also presented with distractibility with environment and people walking around.  - Pt ambulating around nsg/day room loop with 5lb ankle weight donned R LE. Pt cued throughout to increase step clearance/length on R LE. Pt with decreased weight shift to L  - Pt now with tidal tank added in L UE to improve L weight shift with slight increase. Pt with same cues to increase step length/clearance on R. 2 rounds performed (5lb ankle weight still donned R LE) with rest break required.   NMR performed for improvements in motor control and coordination, balance, sequencing, judgement, and self confidence/ efficacy in performing all aspects of mobility at highest level of independence.   Patient supine in bed at end of session with brakes locked, veil closed, and all needs within reach.  SESSION 2 Skilled Therapeutic Interventions/Progress Updates: Patient sitting in [facility] with nsg obtaining vitals on entrance to room. Patient alert and agreeable to PT session.   Patient reported no pain during session. Pt son arrived at beginning and PTA updated pt on CLOF, veil bed potentially being d/c tomorrow per MD order (7/3).   Therapeutic Activity: Transfers: Pt performed sit<>stand transfers throughout session with supervision/CGA in preparation for functional mobility. Provided VC for pt to ensure hips directly in front of sitting surface vs at  angle (max cuing throughout session for safety).  Gait Training:  Pt ambulated throughout session (from room<day room<main gym< room = 200'+) with CGA. Pt demonstrated the following gait deviations with therapist providing the described cuing and facilitation for improvement:  - Decreased weight shift to L leading to  decreased step clearance/length R LE.  (Pt ambulated from day room gym<main gym<room following NMRE with noted improvement in weight shift to L, leading to increase in step clearance/length on R LE all with CGA).   Neuromuscular Re-ed: NMR facilitated during session with focus on weight shifting, proprioceptive feedback, R sided awareness/safety awareness to environment. - Pt ambulating nsg/day room loop with tidal tank in L UE to improve L weight shift with slight increase. Pt also with 5lb ankle weight donned R LE to increase step length/clearance. 2 rounds performed (x2 around loop each round with rest break required).  - Pt instructed to find numbers on one table called out in sequence by PTA (2 numbers at a time) and to match those numbers with same numbers on other table in sequential order while having to navigate bowling pins and hurdles. Pt with CGA/light minA for safety and max cuing to recall sequence/safely navigate obstacles without knocking them over.   NMR performed for improvements in motor control and coordination, balance, sequencing, judgement, and self confidence/ efficacy in performing all aspects of mobility at highest level of independence.   Patient sitting in WC at end of session with brakes locked, son present (son informed to let nsg know when leaving to transfer pt back to veil bed), belt alarm set, and all needs within reach.       Therapy Documentation Precautions:  Precautions Precautions: Fall, Other (comment) Recall of Precautions/Restrictions: Impaired Precaution/Restrictions Comments: posey belt Restrictions Weight Bearing Restrictions Per Provider Order: No  Therapy/Group: Individual Therapy  Chloie Loney PTA 06/19/2024, 3:16 PM

## 2024-06-19 NOTE — Patient Care Conference (Signed)
 Inpatient RehabilitationTeam Conference and Plan of Care Update Date: 06/19/2024   Time: 10:16 AM    Patient Name: Ronald Lowe      Medical Record Number: [id number]  Date of Birth: November 13, 1956 Sex: Male         Room/Bed: 4W10C/4W10C-01 Payor Info: Payor: [employer] MEDICARE / Plan: [employer] MEDICARE HMO / Product Type: *No Product type* /    Admit Date/Time:  06/14/2024  3:32 PM  Primary Diagnosis:  Left middle cerebral artery stroke [facility])  Hospital Problems: Principal Problem:   Left middle cerebral artery stroke [facility])    Expected Discharge Date: Expected Discharge Date: 06/25/24  Team Members Present: Physician leading conference: Dr. Prentice Compton Social Worker Present: Graeme Jude, LCSW Nurse Present: Barnie Ronde, RN PT Present: Sherlean Perks, PT;Dominic Waverly, PTA OT Present: Recardo Maxwell, OT SLP Present: Recardo Mole, SLP     Current Status/Progress Goal Weekly Team Focus  Bowel/Bladder   Continent of bowel and bladder. Assissted with urinal   Pt remains free from S/S of consipation   Offer toileting q 2 hours during day and q 4 at hs. Provided bowel medication per orders.    Swallow/Nutrition/ Hydration   upgraded to Dys 3 solids   supervision  compensatory strategies, awareness, trials of regular    ADL's   CGA overall   supervision overall   pt/fam education, ADL training, dynamic balance, cognition    Mobility   Bed mobility = supervision; Transfers = CGA/light minA for safety awareness and HHA; Ambulation = CGA/minA; motor impersistence R LE noted to be present   supervision/CGA  Barriers = Decreased safety awarenss, behavioral plan; Focus = dynamic standing balance, dual-tasks, gait training w/o AD, coordination, safety awareness.    Communication   mod dysarthria - improved from 6/30 now ~75% intelligble, likely some language deficits suspect word finding based on subtest on SLUMS   supervision   implement strategies, awareness     Safety/Cognition/ Behavioral Observations  mod cognitive defiicts in attention, recall, problem solving, awareness   min A   awareness, implement strategies, pt/ caregiver education    Pain   Denies pain. Pt shows no signs on pain.   Pt stated pain level <2 out of 10.   Assess for S/S of pain q shift and PRN.    Skin   Skin intact.   PT remains free from skin breakdown  Assess skin q shift and PRN.      Discharge Planning:  TBA. Per EMR, pt will d/c to home with his wife. SW will confirm there are no barriers to discharge.   Team Discussion: Patient post left MCA CVA with fluent aphasia and homonymous hemianopsia and right neglect/inattention with dysarthria.   Patient on target to meet rehab goals: Currently doing better as confusion clears and decreased impulsivity.  Able to ambulate without an assistive device and CGA but tends to drag his left foot due to trouble weight shifting.  Continue to work on problem solving and awareness. Needs mod assist for cognition.   *See Care Plan and progress notes for long and short-term goals.   Revisions to Treatment Plan:  Behavior Plan Activation Enclosure bed to tele-sitter 06/20/24 Vision assessment   Teaching Needs: Safety, medications, dietary modification, transfers, toileting, etc.   Current Barriers to Discharge: Decreased caregiver support and Behavior  Possible Resolutions to Barriers: Family education OP follow up services     Medical Summary Current Status: Aphasia, right neglect plus minus homonymous hemianopsia.  Impulsivity, poor safety  awareness, ambulation distance improving  Barriers to Discharge: Medical stability   Possible Resolutions to Barriers/Weekly Focus: May discontinue enoxaparin , continue enclosure bed with goal of weaning   Continued Need for Acute Rehabilitation Level of Care: The patient requires daily medical management by a physician with specialized training in physical medicine and  rehabilitation for the following reasons: Direction of a multidisciplinary physical rehabilitation program to maximize functional independence : Yes Medical management of patient stability for increased activity during participation in an intensive rehabilitation regime.: Yes Analysis of laboratory values and/or radiology reports with any subsequent need for medication adjustment and/or medical intervention. : Yes   I attest that I was present, lead the team conference, and concur with the assessment and plan of the team.   Fredericka Sober B 06/19/2024, 2:15 PM

## 2024-06-19 NOTE — Plan of Care (Signed)
  Problem: Consults Goal: RH STROKE PATIENT EDUCATION Description: See Patient Education module for education specifics  Outcome: Progressing   Problem: RH BOWEL ELIMINATION Goal: RH STG MANAGE BOWEL WITH ASSISTANCE Description: STG Manage Bowel with with min Assistance. Outcome: Progressing Goal: RH STG MANAGE BOWEL W/MEDICATION W/ASSISTANCE Description: STG Manage Bowel with Medication with min Assistance. Outcome: Progressing   Problem: RH BLADDER ELIMINATION Goal: RH STG MANAGE BLADDER WITH ASSISTANCE Description: STG Manage Bladder With min Assistance Outcome: Progressing   Problem: RH COGNITION-NURSING Goal: RH STG USES MEMORY AIDS/STRATEGIES W/ASSIST TO PROBLEM SOLVE Description: STG Uses Memory Aids/Strategies With supervision Assistance to Problem Solve. Outcome: Progressing

## 2024-06-19 NOTE — Care Management (Signed)
 Inpatient Rehabilitation Center Individual Statement of Services  Patient Name:  Ronald Lowe  Date:  06/19/2024  Welcome to the Inpatient Rehabilitation Center.  Our goal is to provide you with an individualized program based on your diagnosis and situation, designed to meet your specific needs.  With this comprehensive rehabilitation program, you will be expected to participate in at least 3 hours of rehabilitation therapies Monday-Friday, with modified therapy programming on the weekends.  Your rehabilitation program will include the following services:  Physical Therapy (PT), Occupational Therapy (OT), Speech Therapy (ST), 24 hour per day rehabilitation nursing, Therapeutic Recreaction (TR), Psychology, Neuropsychology, Care Coordinator, Rehabilitation Medicine, Nutrition Services, Pharmacy Services, and Other  Weekly team conferences will be held on Wednesdays to discuss your progress.  Your Inpatient Rehabilitation Care Coordinator will talk with you frequently to get your input and to update you on team discussions.  Team conferences with you and your family in attendance may also be held.  Expected length of stay: 14-16 days    Overall anticipated outcome: Supervision   Depending on your progress and recovery, your program may change. Your Inpatient Rehabilitation Care Coordinator will coordinate services and will keep you informed of any changes. Your Inpatient Rehabilitation Care Coordinator's name and contact numbers are listed  below.  The following services may also be recommended but are not provided by the Inpatient Rehabilitation Center:  Driving Evaluations Home Health Rehabiltiation Services Outpatient Rehabilitation Services Vocational Rehabilitation   Arrangements will be made to provide these services after discharge if needed.  Arrangements include referral to agencies that provide these services.  Your insurance has been verified to be:  [employer]  Your  primary doctor is:  Raguel Blush  Pertinent information will be shared with your doctor and your insurance company.  Inpatient Rehabilitation Care Coordinator:  Graeme Feliciana SILK [phone] or [phone]  Information discussed with and copy given to patient by: Graeme DELENA Feliciana, 06/19/2024, 10:19 AM

## 2024-06-19 NOTE — Progress Notes (Signed)
 Occupational Therapy Session Note  Patient Details  Name: Ronald Lowe MRN: [id number] Date of Birth: March 11, 1956  Today's Date: 06/19/2024 OT Individual Time: 1035-1120 OT Individual Time Calculation (min): 45 min    Short Term Goals: Week 1:  OT Short Term Goal 1 (Week 1): Pt will complete UB bathing with min A OT Short Term Goal 2 (Week 1): Pt will complete toilet transfers with min A OT Short Term Goal 3 (Week 1): Pt will attend to functional task in quiet environment for >1 min with SUP OT Short Term Goal 4 (Week 1): Pt will participate in additional vision assessment with SUP  Skilled Therapeutic Interventions/Progress Updates:    Pt received in enclosure bed ready for therapy.  Pt sat to EOB and donned shoes and tied them himself with extra time.  Pt ambulated to gym with CGA demonstrating improved attention to R side as he did not catch his R toe at all. In gym assessed R hand strength and FMC. L grasp 70 lbs R grasp 75 lbs  9 hole peg test L - 47 seconds R- 59 seconds   Pt unable to manipulate a pen to control it to write name or a line in a straight line.  The lines were wavy.  Provided pt will med soft theraputty.  Pt worked on squeezing putty with full grasp and then made it into a log and worked on tip pinch with squeezing log.    Reassessed peripheral vision and pt did not demonstrate visual field loss. He also demonstrated good attention to R side with tying shoes and during ambulation.   Pt participated well and followed directions well. Pt relaxed and in good spirits today.   Pt ambulated back to room and opted to sit in w/c.  Belt alarm on with all needs met.   Therapy Documentation Precautions:  Precautions Precautions: Fall, Other (comment) Recall of Precautions/Restrictions: Impaired Precaution/Restrictions Comments: posey belt Restrictions Weight Bearing Restrictions Per Provider Order: No   Pain: Pain Assessment Pain Scale: 0-10 Pain Score:  0-No pain ADL: ADL Eating: Supervision/safety Grooming: Supervision/safety Where Assessed-Grooming: Edge of bed Upper Body Bathing: Supervision/safety Where Assessed-Upper Body Bathing: Shower Lower Body Bathing: Contact guard Where Assessed-Lower Body Bathing: Shower Upper Body Dressing: Supervision/safety Where Assessed-Upper Body Dressing: Chair Lower Body Dressing: Contact guard Where Assessed-Lower Body Dressing: Chair Toileting: Contact guard Where Assessed-Toileting: Teacher, adult education: Furniture conservator/restorer Method: Proofreader: Gaffer: Not assessed Film/video editor: Administrator, arts Method: Designer, industrial/product: Grab bars, Transfer tub bench   Therapy/Group: Individual Therapy  Ronald Lowe 06/19/2024, 9:45 AM

## 2024-06-20 NOTE — Progress Notes (Signed)
 Speech Language Pathology Daily Session Note  Patient Details  Name: Ronald Lowe MRN: [id number] Date of Birth: Oct 27, 1956  Today's Date: 06/20/2024 SLP Individual Time: 1015-1100 SLP Individual Time Calculation (min): 45 min  Short Term Goals: Week 1: SLP Short Term Goal 1 (Week 1): Pt will orient to date using external aid with modA verbal cues SLP Short Term Goal 2 (Week 1): Pt will answer basic yes/no questions with modA  verbal cues SLP Short Term Goal 3 (Week 1): Pt will sustain attention to participate in further cognitive assessment SLP Short Term Goal 4 (Week 1): Pt will tolerate least restrictive diet with minA verbal cues  Skilled Therapeutic Interventions:   Pt and his wife were greeted in his room. He was awake/alert in his recliner upon SLP arrival. He frequently required cues and verbal encouragement to attempt true responses vs humorous ones, but was overall cooperative throughout tx tasks targeting cognition and language. Majority of tasks were conversational vs structured to encourage pt responses. He was able to sustain attention w/ minA overall and was oriented x 3 (required cues for reason). Remaining info provided via errorless learning. He benefited from maxA to complete concrete generative naming task (3 items), however, success improved to minA when completing a mildly specific responsive naming task. He then requested assistance to the bathroom and pt's wife assisted w/ hand held assist as needed. He was continent of bladder and required modA cues for attention and problem solving throughout. Throughout functional task, he required only s cues to follow directions. At the end of tx tasks, he was left in his bed with the alarm set and call light within reach. Telesitter in view as well. Recommend cont ST per POC.   Pain  No pain reported  Therapy/Group: Individual Therapy  Recardo DELENA Mole 06/20/2024, 1:15 PM

## 2024-06-20 NOTE — Progress Notes (Signed)
 Spoke and talk to pt's wife and explained pt is back into enclosure bed. She explained that she just wanted to check on well being and speak to him. I explained the pt is calm and in the enclosure bed and stable VS.

## 2024-06-20 NOTE — Progress Notes (Signed)
 Pt bp systolic has been running high 170s-190s. Checked twice on the dinamap and verified manually. Final bp is 172/102

## 2024-06-20 NOTE — Progress Notes (Signed)
 Physical Therapy Session Note  Patient Details  Name: Ronald Lowe MRN: [id number] Date of Birth: March 29, 1956  Today's Date: 06/20/2024 PT Individual Time: 1400-1430 PT Individual Time Calculation (min): 30 min   Short Term Goals: Week 1:  PT Short Term Goal 1 (Week 1): Pt will complete bed mobility with min assist consistently PT Short Term Goal 2 (Week 1): Pt will complete transfers with min assist consistently PT Short Term Goal 3 (Week 1): Pt will ambulate 150' with min assist consistently PT Short Term Goal 4 (Week 1): Pt will complete up/down 4 steps with BHRs min assist  Skilled Therapeutic Interventions/Progress Updates: Patient sitting in recliner following SLP session on entrance to room. Patient alert and agreeable to PT session.   Patient reported no pain during session. Pt ambulated to toilet from recliner in room with CGA/close supervision. Pt voided bladder standing at toilet without incident close supervision. Pt washed hands and ambulated from room<main gym where PTA noticed pt's front of pants were soiled. Pt ambulated back to room to change clothes. Pt doffed personal pants with supervision while standing, and donned personal pants (after PTA donned brief) with minA for time management. Pt cleaned B LE's with clean cloth for hygiene. Pt ambulated from room<day room gym. Overall, pt required CGA/minA to safely navigate hallway as pt presented with increase in confusion, distractibility, and decrease in R sided awareness, and bumped into objects periodically. Pt in day room gym tossing big beach ball back and forth with + 2. Pt required static toss for few rounds to adjust coordination. Pt then ambulated with cues to catch ball, and to toss back to + 2 with pt demonstrating gait deviations/imbalances (scissoring occasionally) but taking necessary stepping strategy in direction of LOB (although delayed reaction). Pt performed with overall CGA/minA to prevent LOB. Pt ambulated back to  room and sat to recliner with pt recalling instructions to call nsg if needing to transfer back to bed or use the toilet. Pt performed NMRE in minimally distracting environment.   Patient sitting in recliner at end of session with brakes locked, belt alarm set, and all needs within reach.      Therapy Documentation Precautions:  Precautions Precautions: Fall, Other (comment) Recall of Precautions/Restrictions: Impaired Precaution/Restrictions Comments: posey belt Restrictions Weight Bearing Restrictions Per Provider Order: No  Therapy/Group: Individual Therapy  Tremane Spurgeon PTA 06/20/2024, 3:15 PM

## 2024-06-20 NOTE — Progress Notes (Signed)
 Speech Language Pathology Daily Session Note  Patient Details  Name: Ronald Lowe MRN: [id number] Date of Birth: 1956-04-03  Today's Date: 06/20/2024 SLP Individual Time: 1300-1357 SLP Individual Time Calculation (min): 57 min  Short Term Goals: Week 1: SLP Short Term Goal 1 (Week 1): Pt will orient to date using external aid with modA verbal cues SLP Short Term Goal 2 (Week 1): Pt will answer basic yes/no questions with modA  verbal cues SLP Short Term Goal 3 (Week 1): Pt will sustain attention to participate in further cognitive assessment SLP Short Term Goal 4 (Week 1): Pt will tolerate least restrictive diet with minA verbal cues  Skilled Therapeutic Interventions: Skilled therapy session focused on cognitive goals. Patient oriented to self, situation, location and time. Patient required use of external aid for age. SLP targeted memory through recall of biographical information. Patient recalled wife and childrens names, though unable to identify grandchildrens names given pictures. Patient correctly recalled location of home and general information regarding job history. Patient with poor sustained attention and awareness requiring modA to attend to task and demonstrate intellectual awareness of deficits. SLP targeted problem solving through simple money management task. Patient required modA to count change according to verbalized amount. Patient left in chair with alarm set and call bell in reach. Continue POC  Pain None reported   Therapy/Group: Individual Therapy  Adelyne Marchese M.A., CCC-SLP 06/20/2024, 7:40 AM

## 2024-06-20 NOTE — Progress Notes (Signed)
 This RN called provider to request enclosure bed d/t patient's continued restlessness, agitation and confusion.Patient has required 1:1 sitter since admin of PRN seroquel  at 1440. Patient only alert to self and anxious to leave for the bar. Patient has been toileted x3 in past 2-3 hours and supervised for dinner eating approx 40% of meal

## 2024-06-20 NOTE — Progress Notes (Incomplete)
 PROGRESS NOTE   Subjective/Complaints:  Per therapy team and nursing , impulsibvity and safety awareness improved will d/c enclosure bed today and use telesitter    ROS: limited by cognition/speech.   Objective:   No results found. No results for input(s): WBC, HGB, HCT, PLT in the last 72 hours.   No results for input(s): NA, K, CL, CO2, GLUCOSE, BUN, CREATININE, CALCIUM  in the last 72 hours.        Intake/Output Summary (Last 24 hours) at 06/20/2024 0847 Last data filed at 06/20/2024 0820 Gross per 24 hour  Intake 657 ml  Output --  Net 657 ml        Physical Exam: Vital Signs Blood pressure (!) 172/102, pulse 65, temperature 97.8 F (36.6 C), temperature source Oral, resp. rate 18, weight 64.8 kg, SpO2 96%.  Oriented to person and place but not time General: No acute distress Mood and affect are appropriate Heart: Regular rate and rhythm no rubs murmurs or extra sounds Lungs: Clear to auscultation, breathing unlabored, no rales or wheezes Abdomen: Positive bowel sounds, soft nontender to palpation, nondistended Extremities: No clubbing, cyanosis, or edema Skin: No evidence of breakdown, no evidence of rash   dysarthria; mostly fluent speech. Needs repetition to follow simple commands    DTRs: Reflexes were 2+ in bilateral achilles, patella, biceps, BR and triceps.  Sensory exam: difficult to assess due to aphasia and reduced concentration  RIght neglect +/- HH noted  Motor exam: Poor participation; grossly 4+/5 RUE and RLE; 5/5 LUE and LLE Coordination: ataxic RUE        Neurological:     Comments: Patient is alert and makes eye contact with examiner.  Speech is dysarthric.  He was able to answer some basic questions with some delay in processing and very limited medical historian.  Assessment/Plan: 1. Functional deficits which require 3+ hours per day of interdisciplinary  therapy in a comprehensive inpatient rehab setting. Physiatrist is providing close team supervision and 24 hour management of active medical problems listed below. Physiatrist and rehab team continue to assess barriers to discharge/monitor patient progress toward functional and medical goals  Care Tool:  Bathing    Body parts bathed by patient: Right arm, Left arm, Chest, Abdomen, Front perineal area, Buttocks, Right upper leg, Face, Left lower leg, Right lower leg, Left upper leg   Body parts bathed by helper: Right arm, Left arm, Face, Left lower leg, Right lower leg, Left upper leg, Right upper leg, Buttocks, Front perineal area, Abdomen, Chest     Bathing assist Assist Level: Contact Guard/Touching assist     Upper Body Dressing/Undressing Upper body dressing   What is the patient wearing?: Pull over shirt    Upper body assist Assist Level: Supervision/Verbal cueing    Lower Body Dressing/Undressing Lower body dressing      What is the patient wearing?: Pants, Underwear/pull up     Lower body assist Assist for lower body dressing: Contact Guard/Touching assist     Toileting Toileting    Toileting assist Assist for toileting: Contact Guard/Touching assist     Transfers Chair/bed transfer  Transfers assist     Chair/bed transfer assist level: Contact  Guard/Touching assist     Locomotion Ambulation   Ambulation assist      Assist level: Minimal Assistance - Patient > 75% Assistive device: Hand held assist Max distance: 180   Walk 10 feet activity   Assist     Assist level: Minimal Assistance - Patient > 75% Assistive device: Hand held assist   Walk 50 feet activity   Assist    Assist level: Minimal Assistance - Patient > 75% Assistive device: Hand held assist    Walk 150 feet activity   Assist    Assist level: Minimal Assistance - Patient > 75% Assistive device: Hand held assist    Walk 10 feet on uneven surface   activity   Assist Walk 10 feet on uneven surfaces activity did not occur: Safety/medical concerns         Wheelchair     Assist Is the patient using a wheelchair?: Yes Type of Wheelchair: Manual    Wheelchair assist level: Total Assistance - Patient < 25% Max wheelchair distance: 150'    Wheelchair 50 feet with 2 turns activity    Assist        Assist Level: Total Assistance - Patient < 25%   Wheelchair 150 feet activity     Assist      Assist Level: Total Assistance - Patient < 25%   Blood pressure (!) 172/102, pulse 65, temperature 97.8 F (36.6 C), temperature source Oral, resp. rate 18, weight 64.8 kg, SpO2 96%.  Medical Problem List and Plan: 1. Functional deficits secondary to left MCA scattered infarct likely etiology due to symptomatic proximal ICA stenosis             -patient may  shower             -ELOS/Goals: 14-16 days, Min A PT/OT/SLP             -Continue CIR ? HH vs neglect     2.  Antithrombotics: -DVT/anticoagulation:  Pharmaceutical: Lovenox  40mg  daily  -antiplatelet therapy: Aspirin  81 mg daily and Plavix  75 mg daily until after left carotid surgery  3. Pain Management: Tylenol  as needed 4. Mood/Behavior/Sleep/alcohol withdrawal/delirium: Provide emotional support             -antipsychotic agents: seroquel  25 mg at bedtime + BID PRN          D/C enclosure bed today 7/3              - add sleep log, trazodone 50 mg at bedtime PRN for sleep Continue enclosure bed for now discuss with care team today   5. Neuropsych/cognition: This patient is not capable of making decisions on his own behalf. 6. Skin/Wound Care: Routine skin checks 7. Fluids/Electrolytes/Nutrition: Routine in and outs with follow-up chemistries -Continue vitamins and supplements 8.  Dysphagia.  Dysphagia #2 thin liquids.  Follow-up speech therapy 9.  COPD/tobacco use.  Continue inhalers.  NicoDerm patch.  Provide counseling 10.  PAF.  Noncompliant  anticoagulation.  Cardiac rate controlled 11.  History of alcohol use.  Monitor for any signs of withdrawal.  Provide counseling Po thiamine  12.  AKI/CKD.  Follow-up chemistries Monday 13.  GERD.  Protonix  40mg  daily 14.  Hypertension.  Patient on lisinopril  5 mg day prior to admission but noncompliant.  Monitor with increased mobility  Controlled 06/18/2024 Vitals:   06/16/24 1306 06/16/24 1900 06/17/24 0622 06/17/24 1518  BP: 126/89 (!) 160/87 134/68 133/74   06/17/24 1944 06/18/24 0514 06/18/24 1248 06/18/24 1937  BP:  133/77 133/88 107/70 (!) 141/85   06/19/24 0347 06/19/24 1311 06/19/24 2320 06/20/24 0630  BP: (!) 143/85 138/85 (!) 157/85 (!) 172/102    15.  Symptomatic left ICA stenosis.  Follow-up outpatient vascular surgery Dr. [name] 16. Bowel management: Now having regular continent stools   LOS: 6 days A FACE TO FACE EVALUATION WAS PERFORMED  Prentice FORBES [place] 06/20/2024, 8:47 AM

## 2024-06-20 NOTE — Progress Notes (Signed)
 Patient required PRN dose of quetiapine  d/t agitation. Patient bed alarm and telesitter alarm sounded;staff immediately arrived to room to meet patient at the door confused and wanting to go outside. After several minutes staff was able to calm patient down, admin above PRN med seated in [facility] and taken to nurses station.

## 2024-06-20 NOTE — Progress Notes (Signed)
 This RN attempted to reach wife [name] at both home and cell phone number provided. Voicemail was full and unable to leave message to notify wife that patient has been placed back into enclosure bed d/t continued agitation, confusion and restlessness.

## 2024-06-20 NOTE — Progress Notes (Signed)
 Occupational Therapy Session Note  Patient Details  Name: Ronald Lowe MRN: [id number] Date of Birth: Jun 09, 1956  Today's Date: 06/20/2024 OT Individual Time: 9152-9061 OT Individual Time Calculation (min): 51 min  9 mins missed d/t pt receiving breathing treatment   Short Term Goals: Week 1:  OT Short Term Goal 1 (Week 1): Pt will complete UB bathing with min A OT Short Term Goal 2 (Week 1): Pt will complete toilet transfers with min A OT Short Term Goal 3 (Week 1): Pt will attend to functional task in quiet environment for >1 min with SUP OT Short Term Goal 4 (Week 1): Pt will participate in additional vision assessment with SUP  Skilled Therapeutic Interventions/Progress Updates:  Pt greeted seated in recliner, pt agreeable to OT intervention.      Transfers/bed mobility/functional mobility:  Pt completed functional ambulation with no AD and CGA.   family education provided to [place]( pts wife) on the below topics: -always using gait belt for functional mobility and guarding pt on R side. -recommendation of 24/7 supervision for all ADLS and functional ambulation  - general assist needed for ADLS and functional mobility ( I.e CGA- MINA) -general education provided on R sided inattention especially during functional ambulation  - discussed DME needs, pts wife requesting BSC to be used over toilet and as a shower chair -pt able to demonstrate ability to step over tub with close CGA. Pts wife assisted pt with shower transfer with same level of assist.  - pt already has grab bar in shower on R side.  -wife reports pt sleeps on the couch however pt was able to demo ability to sit>stand from compliant recliner with supervision.     Therapeutic activity:  Wife expresses concern about pt walking across uneven surfaces therefore set- up obstacle course with pt completing functional ambulation across cobblestone surface and uneven ground with close CGA- MINA. Pt did have one LOB to R side  needed MIN A to recover. Pts wife able to assist pt across same obstacle course with MINA.   Worked on stair training in an effort to improve R sided attention as pt noted to not place R foot fully on step. Pt completed 3 trials of stairs with MINA- CGA, MIN intermittent cues needed to make sure R foot was fully on step. Wife also completed hands on assist for stair navigation with good carryover and safe handling technique.   Cognition:  Pt continues to be pleasantly confused but able to problem solve approaching 4th of July holiday. Pt following commands with 100% accuracy.   Ended session with pt seated in recliner with all needs within reach and chair alarm activated.                    Therapy Documentation Precautions:  Precautions Precautions: Fall, Other (comment) Recall of Precautions/Restrictions: Impaired Precaution/Restrictions Comments: posey belt Restrictions Weight Bearing Restrictions Per Provider Order: No General: General OT Amount of Missed Time: 9 Minutes  Pain: no pain     Therapy/Group: Individual Therapy  [name] [facility] 06/20/2024, 10:09 AM

## 2024-06-20 NOTE — Plan of Care (Signed)
  Problem: Consults Goal: RH STROKE PATIENT EDUCATION Description: See Patient Education module for education specifics  Outcome: Progressing   Problem: RH BOWEL ELIMINATION Goal: RH STG MANAGE BOWEL WITH ASSISTANCE Description: STG Manage Bowel with with min Assistance. Outcome: Progressing Goal: RH STG MANAGE BOWEL W/MEDICATION W/ASSISTANCE Description: STG Manage Bowel with Medication with min Assistance. Outcome: Progressing   Problem: RH BLADDER ELIMINATION Goal: RH STG MANAGE BLADDER WITH ASSISTANCE Description: STG Manage Bladder With min Assistance Outcome: Progressing   Problem: RH SAFETY Goal: RH STG ADHERE TO SAFETY PRECAUTIONS W/ASSISTANCE/DEVICE Description: STG Adhere to Safety Precautions With min Assistance/Device. Outcome: Progressing   Problem: RH COGNITION-NURSING Goal: RH STG USES MEMORY AIDS/STRATEGIES W/ASSIST TO PROBLEM SOLVE Description: STG Uses Memory Aids/Strategies With supervision Assistance to Problem Solve. Outcome: Progressing   Problem: RH PAIN MANAGEMENT Goal: RH STG PAIN MANAGED AT OR BELOW PT'S PAIN GOAL Description: Pain < 4 with prns Outcome: Progressing   Problem: RH KNOWLEDGE DEFICIT Goal: RH STG INCREASE KNOWLEDGE OF HYPERTENSION Description: Patient and wife will be able to manage HTN using educational resources for medications and dietary modification independently Outcome: Progressing Goal: RH STG INCREASE KNOWLEDGE OF DYSPHAGIA/FLUID INTAKE Description: Patient and wife will be able to manage dysphagia using educational resources for medications and dietary modification independently Outcome: Progressing Goal: RH STG INCREASE KNOWLEGDE OF HYPERLIPIDEMIA Description: Patient and wife will be able to manage HLD using educational resources for medications and dietary modification independently Outcome: Progressing Goal: RH STG INCREASE KNOWLEDGE OF STROKE PROPHYLAXIS Description: Patient and wife will be able to manage secondary  risks using educational resources for medications and dietary modification independently Outcome: Progressing   Problem: Safety: Goal: Non-violent Restraint(s) Outcome: Progressing

## 2024-06-21 MED ORDER — LISINOPRIL 10 MG PO TABS
10.0000 mg | ORAL_TABLET | Freq: Every day | ORAL | Status: DC
  Administered 2024-06-22 – 2024-06-24 (×3): 10 mg via ORAL
  Filled 2024-06-21 (×3): qty 1

## 2024-06-21 NOTE — Group Note (Signed)
 Patient Details Name: Ronald Lowe MRN: [id number] DOB: [birth year] Today's Date: 06/21/2024  Time Calculation: OT Group Time Calculation OT Group Start Time: 1000 OT Group Stop Time: 1100 OT Group Time Calculation (min): 60 min      Group Description: Dance Group: Pt participated in dance group with an emphasis on social interaction, motor planning, increasing overall activity tolerance and bimanual tasks. All songs were selected by group members. Dance moves included AROM of BUE/BLE gross motor movements with an emphasis on building functional endurance.   Individual level documentation: Patient completed group from sitting level. Patient needed supervision to complete various dance moves with cues for attention.  Patient needed min modifications during group.  Pain:  0/10  Precautions:  Fall, confusion   Katheryn SHAUNNA [place] 06/21/2024, 12:46 PM

## 2024-06-21 NOTE — Plan of Care (Signed)
  Problem: Consults Goal: RH STROKE PATIENT EDUCATION Description: See Patient Education module for education specifics  Outcome: Progressing   Problem: RH BOWEL ELIMINATION Goal: RH STG MANAGE BOWEL WITH ASSISTANCE Description: STG Manage Bowel with with min Assistance. Outcome: Progressing Goal: RH STG MANAGE BOWEL W/MEDICATION W/ASSISTANCE Description: STG Manage Bowel with Medication with min Assistance. Outcome: Progressing   Problem: RH BLADDER ELIMINATION Goal: RH STG MANAGE BLADDER WITH ASSISTANCE Description: STG Manage Bladder With min Assistance Outcome: Progressing   Problem: RH SAFETY Goal: RH STG ADHERE TO SAFETY PRECAUTIONS W/ASSISTANCE/DEVICE Description: STG Adhere to Safety Precautions With min Assistance/Device. Outcome: Progressing   Problem: RH COGNITION-NURSING Goal: RH STG USES MEMORY AIDS/STRATEGIES W/ASSIST TO PROBLEM SOLVE Description: STG Uses Memory Aids/Strategies With supervision Assistance to Problem Solve. Outcome: Progressing   Problem: RH PAIN MANAGEMENT Goal: RH STG PAIN MANAGED AT OR BELOW PT'S PAIN GOAL Description: Pain < 4 with prns Outcome: Progressing   Problem: RH KNOWLEDGE DEFICIT Goal: RH STG INCREASE KNOWLEDGE OF HYPERTENSION Description: Patient and wife will be able to manage HTN using educational resources for medications and dietary modification independently Outcome: Progressing Goal: RH STG INCREASE KNOWLEDGE OF DYSPHAGIA/FLUID INTAKE Description: Patient and wife will be able to manage dysphagia using educational resources for medications and dietary modification independently Outcome: Progressing Goal: RH STG INCREASE KNOWLEGDE OF HYPERLIPIDEMIA Description: Patient and wife will be able to manage HLD using educational resources for medications and dietary modification independently Outcome: Progressing Goal: RH STG INCREASE KNOWLEDGE OF STROKE PROPHYLAXIS Description: Patient and wife will be able to manage secondary  risks using educational resources for medications and dietary modification independently Outcome: Progressing   Problem: Safety: Goal: Non-violent Restraint(s) Outcome: Progressing

## 2024-06-21 NOTE — Progress Notes (Signed)
 Physical Therapy Session Note  Patient Details  Name: Ronald Lowe MRN: [id number] Date of Birth: [birth year]  Today's Date: 06/21/2024 PT Individual Time: 1134-1200 PT Individual Time Calculation (min): 26 min   Short Term Goals: Week 1:  PT Short Term Goal 1 (Week 1): Pt will complete bed mobility with min assist consistently PT Short Term Goal 2 (Week 1): Pt will complete transfers with min assist consistently PT Short Term Goal 3 (Week 1): Pt will ambulate 150' with min assist consistently PT Short Term Goal 4 (Week 1): Pt will complete up/down 4 steps with BHRs min assist  Skilled Therapeutic Interventions/Progress Updates: Patient sitting in recliner on entrance to room. Patient alert and agreeable to PT session.   Patient reported no pain during session. Pt required mod/max cuing throughout session to understand cues during transfers and interventions (continues to demonstrate confusion). Pt performed sit<>stand transfers throughout session with CGA/close supervision in preparation for functional transfers. Provided VC for pt to increase awareness when sitting as pt would sit unsafely with one hip not on sitting surface. Pt ambulated from room<>day room gym with CGA and VC to continue to increase awareness to R side.Pt ambulating around nsg/day room loop with cues to look in various directions while maintaining forward cadence with overall CGA/light minA to safely navigate obstacles in hallway on R side. Pt seemingly better with navigating R sided awareness this session vs yesterday's session.  Pt ambulating around day room/nsg loop while tossing small beach ball in air and then catching while having to navigate hallway obstacles. Pt also cued to pick up ball from ground when it fell. Pt demonstrated quick reaction to grab ball in air without LOB (CGA/light minA required for safety). Pt required VC throughout to increase awareness to R side when attempting to catch to avoid bumping into  object. Pt reported dizziness and sat to rest with vitals check (recorded below). Pt performed another round without issue or rest break. Pt performed sit<supine in veil bed with supervision.   NMR performed for improvements in motor control and coordination, balance, sequencing, judgement, and self confidence/ efficacy in performing all aspects of mobility at highest level of independence.   Patient supine in bed at end of session with brakes locked, veil closed, and all needs within reach.      Therapy Documentation Precautions:  Precautions Precautions: Fall, Other (comment) Recall of Precautions/Restrictions: Impaired Precaution/Restrictions Comments: posey belt Restrictions Weight Bearing Restrictions Per Provider Order: No  HR: 84 bpm (R radial pulse)  141/84 (100)  Therapy/Group: Individual Therapy  Aishi Courts PTA 06/21/2024, 12:34 PM

## 2024-06-21 NOTE — Progress Notes (Signed)
 Speech Language Pathology Weekly Progress and Session Note  Patient Details  Name: Ronald Lowe MRN: [id number] Date of Birth: August 19, 1956  Beginning of progress report period: June 15, 2024 End of progress report period: June 21, 2024  Today's Date: 06/21/2024 SLP Individual Time: 1245-1345 SLP Individual Time Calculation (min): 60 min  Short Term Goals: Week 1: SLP Short Term Goal 1 (Week 1): Pt will orient to date using external aid with modA verbal cues SLP Short Term Goal 1 - Progress (Week 1): Met SLP Short Term Goal 2 (Week 1): Pt will answer basic yes/no questions with modA  verbal cues SLP Short Term Goal 2 - Progress (Week 1): Met SLP Short Term Goal 3 (Week 1): Pt will sustain attention to participate in further cognitive assessment SLP Short Term Goal 3 - Progress (Week 1): Met SLP Short Term Goal 4 (Week 1): Pt will tolerate least restrictive diet with minA verbal cues SLP Short Term Goal 4 - Progress (Week 1): Met    New Short Term Goals: Week 2: SLP Short Term Goal 1 (Week 2): STG = LTG due to ELOS  Weekly Progress Updates: Pt has made great gains and has met 4 of 4 STGs this reporting period due to improved communication, cognition and dysphagia. Currently, patient continues to require mod A for cognition, modA for communication and minA for use of safe swallowing strategies during consumption of D3/thin diet.  Pt/family eduction ongoing. Pt would benefit from continued ST intervention to maximize swallowing safety. communication and cognition in order to maximize functional independence at d/c.    Intensity: Minumum of 1-2 x/day, 30 to 90 minutes Frequency: 3 to 5 out of 7 days Duration/Length of Stay: 7/8 Treatment/Interventions: Cognitive remediation/compensation;Internal/external aids;Environmental controls;Multimodal communication approach;Speech/Language facilitation;Cueing hierarchy;Functional tasks;Dysphagia/aspiration precaution training;Patient/family  education;Therapeutic Activities   Daily Session  Skilled Therapeutic Interventions:  Skilled therapy session focused on cognitive, dysphagia and communication goals. SLP facilitated session by observing patient consume regular/thin textures. Patient with timely mastication, adequate oral clearance and no s/sx of aspiration. Continue current diet with full supervision. SLP targeted cognitive goals through orientation task. Patient oriented to self, situation, location and todays holiday. Patient continues to recall incorrect age. Patient required modA to answer functional (verbal) problem solving situations, though majority of cues were needed to sustain attention to task and reduce verbosity. At the conclusion of the session, SLP targeted communication. Patient answered simple yes/no questions with 100% accuracy and complex yes/no with 90% increasing to 100% given x1 repetition. Patient was left in chair at nurses station. Continue POC      Pain None reported   Therapy/Group: Individual Therapy  [name] M.A., CCC-SLP 06/21/2024, 10:40 AM

## 2024-06-21 NOTE — Progress Notes (Signed)
 Occupational Therapy Weekly Progress Note  Patient Details  Name: Ronald Lowe MRN: [id number] Date of Birth: 1956-08-05  Beginning of progress report period: June 15, 2024 End of progress report period: June 21, 2024  Today's Date: 06/21/2024  Patient has met 3 of 4 short term goals.  Pt currently completes UB ADLS with set- up assist and MODA for LB ADLS. Pt completes ambulatory ADL transfers with MIN A- CGA depending on cognition. Pt continues to present with impaired safety awareness and attention needing up to MAX multimodal cues at times for R sided attention and safety awareness. Pt continues to present with deficits in problem solving in relation to ADL safety.   Patient continues to demonstrate the following deficits: {impairments:3041632} and therefore will continue to benefit from skilled OT intervention to enhance overall performance with {ADL/iADL:3041649}.  Patient {LTG progression:3041653}.  {plan of rjmz:6958345}  OT Short Term Goals Week 1:  OT Short Term Goal 1 (Week 1): Pt will complete UB bathing with min A OT Short Term Goal 1 - Progress (Week 1): Met OT Short Term Goal 2 (Week 1): Pt will complete toilet transfers with min A OT Short Term Goal 2 - Progress (Week 1): Met OT Short Term Goal 3 (Week 1): Pt will attend to functional task in quiet environment for >1 min with SUP OT Short Term Goal 3 - Progress (Week 1): Met OT Short Term Goal 4 (Week 1): Pt will participate in additional vision assessment with SUP OT Short Term Goal 4 - Progress (Week 1): Progressing toward goal Week 2:  OT Short Term Goal 1 (Week 2): STG= LTGs   Therapy Documentation Precautions:  Precautions Precautions: Fall, Other (comment) Recall of Precautions/Restrictions: Impaired Precaution/Restrictions Comments: posey belt Restrictions Weight Bearing Restrictions Per Provider Order: No     Therapy/Group: Individual Therapy  Ronald Lowe [facility] 06/21/2024, 9:13 AM

## 2024-06-21 NOTE — Progress Notes (Signed)
 Occupational Therapy Session Note  Patient Details  Name: Ronald Lowe MRN: [id number] Date of Birth: 02/17/1956  Today's Date: 06/21/2024 OT Individual Time: 9163-9067 OT Individual Time Calculation (min): 56 min    Short Term Goals: Week 1:  OT Short Term Goal 1 (Week 1): Pt will complete UB bathing with min A OT Short Term Goal 1 - Progress (Week 1): Met OT Short Term Goal 2 (Week 1): Pt will complete toilet transfers with min A OT Short Term Goal 2 - Progress (Week 1): Met OT Short Term Goal 3 (Week 1): Pt will attend to functional task in quiet environment for >1 min with SUP OT Short Term Goal 3 - Progress (Week 1): Met OT Short Term Goal 4 (Week 1): Pt will participate in additional vision assessment with SUP OT Short Term Goal 4 - Progress (Week 1): Progressing toward goal  Skilled Therapeutic Interventions/Progress Updates: Pt greeted supine in veil bed, pt agreeable to OT intervention.      Transfers/bed mobility/functional mobility: pt completed supine>sit with supervision. Ambulatory ADL transfers with [facility] for + safety d/t elements of shower.   ADLs:  Grooming: pt donned deo with set up assist  UB dressing:pt donned [place] shirt with set- up assist.  LB dressing: pt donned pants with MODA needing assist to thread pants but able to assist with pulling pants to waist line.  Footwear: donned socks with total A for time mgmt  Bathing: pt completed bathing from sitting/standing with close CGA for balance and safety. Emphasis on forced use of RUE and visual scanning to R side.  Transfers: ambulatory ADL transfers with MIN HHA  Self feeding: pt completed self feeding with overall set- up assist with a focus on R sided attention and visual scanning to R side to locate needed items on tray. Pt needed MIN intermittent cues to locate beverages and supplies.    Cognition:  Pt repotrs he was  in the back of pick up trunk last night with the doctors pictured on the wall. Pt able  to problem solve correct July 4th holiday.   Ended session with pt seated in recliner with all needs within reach and chair alarm activated.                    Therapy Documentation Precautions:  Precautions Precautions: Fall, Other (comment) Recall of Precautions/Restrictions: Impaired Precaution/Restrictions Comments: posey belt Restrictions Weight Bearing Restrictions Per Provider Order: No  Pain: No pain    Therapy/Group: Individual Therapy  [name] [facility] 06/21/2024, 12:09 PM

## 2024-06-21 NOTE — Progress Notes (Signed)
 PROGRESS NOTE   Subjective/Complaints: Attempted to d/c enclosure bed yesterday but was mildly agitated and active starting yesterday pm  Continues to require enclosure bed , was trying to get OOB yesterday pm , still with high fall risk , right neglect    ROS: limited by cognition/speech.   Objective:   No results found. No results for input(s): WBC, HGB, HCT, PLT in the last 72 hours.   No results for input(s): NA, K, CL, CO2, GLUCOSE, BUN, CREATININE, CALCIUM  in the last 72 hours.        Intake/Output Summary (Last 24 hours) at 06/21/2024 0810 Last data filed at 06/20/2024 1805 Gross per 24 hour  Intake 600 ml  Output --  Net 600 ml        Physical Exam: Vital Signs Blood pressure (!) 177/80, pulse 65, temperature 98 F (36.7 C), resp. rate 19, weight 64.8 kg, SpO2 94%.  Oriented to person and place but not time General: No acute distress Mood and affect are appropriate Heart: Regular rate and rhythm no rubs murmurs or extra sounds Lungs: Clear to auscultation, breathing unlabored, no rales or wheezes Abdomen: Positive bowel sounds, soft nontender to palpation, nondistended Extremities: No clubbing, cyanosis, or edema Skin: No evidence of breakdown, no evidence of rash   dysarthria; mostly fluent speech. Needs repetition to follow simple commands    DTRs: Reflexes were 2+ in bilateral achilles, patella, biceps, BR and triceps. Able to name pen, glasses and stethoscope Sensory exam: difficult to assess due to aphasia and reduced concentration  RIght neglect +/- HH noted  Motor exam: Poor participation; grossly 4+/5 RUE and RLE; 5/5 LUE and LLE Coordination: ataxic RUE        Neurological:     Comments: Patient is alert and makes eye contact with examiner.  Speech is dysarthric.  He was able to answer some basic questions with some delay in processing and very limited medical  historian.  Assessment/Plan: 1. Functional deficits which require 3+ hours per day of interdisciplinary therapy in a comprehensive inpatient rehab setting. Physiatrist is providing close team supervision and 24 hour management of active medical problems listed below. Physiatrist and rehab team continue to assess barriers to discharge/monitor patient progress toward functional and medical goals  Care Tool:  Bathing    Body parts bathed by patient: Right arm, Left arm, Chest, Abdomen, Front perineal area, Buttocks, Right upper leg, Face, Left lower leg, Right lower leg, Left upper leg   Body parts bathed by helper: Right arm, Left arm, Face, Left lower leg, Right lower leg, Left upper leg, Right upper leg, Buttocks, Front perineal area, Abdomen, Chest     Bathing assist Assist Level: Contact Guard/Touching assist     Upper Body Dressing/Undressing Upper body dressing   What is the patient wearing?: Pull over shirt    Upper body assist Assist Level: Supervision/Verbal cueing    Lower Body Dressing/Undressing Lower body dressing      What is the patient wearing?: Pants, Underwear/pull up     Lower body assist Assist for lower body dressing: Contact Guard/Touching assist     Toileting Toileting    Toileting assist Assist for toileting: Contact  Guard/Touching assist     Transfers Chair/bed transfer  Transfers assist     Chair/bed transfer assist level: Contact Guard/Touching assist     Locomotion Ambulation   Ambulation assist      Assist level: Minimal Assistance - Patient > 75% Assistive device: Hand held assist Max distance: 180   Walk 10 feet activity   Assist     Assist level: Minimal Assistance - Patient > 75% Assistive device: Hand held assist   Walk 50 feet activity   Assist    Assist level: Minimal Assistance - Patient > 75% Assistive device: Hand held assist    Walk 150 feet activity   Assist    Assist level: Minimal Assistance -  Patient > 75% Assistive device: Hand held assist    Walk 10 feet on uneven surface  activity   Assist Walk 10 feet on uneven surfaces activity did not occur: Safety/medical concerns         Wheelchair     Assist Is the patient using a wheelchair?: Yes Type of Wheelchair: Manual    Wheelchair assist level: Total Assistance - Patient < 25% Max wheelchair distance: 150'    Wheelchair 50 feet with 2 turns activity    Assist        Assist Level: Total Assistance - Patient < 25%   Wheelchair 150 feet activity     Assist      Assist Level: Total Assistance - Patient < 25%   Blood pressure (!) 177/80, pulse 65, temperature 98 F (36.7 C), resp. rate 19, weight 64.8 kg, SpO2 94%.  Medical Problem List and Plan: 1. Functional deficits secondary to left MCA scattered infarct likely etiology due to symptomatic proximal ICA stenosis             -patient may  shower             -ELOS/Goals: 7/8 Min A PT/OT/SLP             -Continue CIR     2.  Antithrombotics: -DVT/anticoagulation:  Pharmaceutical: Lovenox  40mg  daily  -antiplatelet therapy: Aspirin  81 mg daily and Plavix  75 mg daily until after left carotid surgery  3. Pain Management: Tylenol  as needed 4. Mood/Behavior/Sleep/alcohol withdrawal/delirium: Provide emotional support             -antipsychotic agents: seroquel  25 mg at bedtime + BID PRN              - Veil bed for high fall risk, attempting OOB, confusion              - delirium precautions              - add sleep log, trazodone  50 mg at bedtime PRN for sleep Continue enclosure bed for now will try to d/c Mon or Tues   5. Neuropsych/cognition: This patient is not capable of making decisions on his own behalf. 6. Skin/Wound Care: Routine skin checks 7. Fluids/Electrolytes/Nutrition: Routine in and outs with follow-up chemistries -Continue vitamins and supplements Appetitie ~50% meals cont to encourage , cont BID Ensure  8.  Dysphagia.   Dysphagia #2 thin liquids.  Follow-up speech therapy 9.  COPD/tobacco use.  Continue inhalers.  NicoDerm patch.  Provide counseling 10.  PAF.  Noncompliant anticoagulation.  Cardiac rate controlled 11.  History of alcohol use.  Monitor for any signs of withdrawal.  Provide counseling Cont thiamine  100mg  po daily 12.  AKI/CKD.  Follow-up chemistries Monday 13.  GERD.  Protonix  40mg   daily 14.  Hypertension.  Patient on lisinopril  5 mg day prior to admission but noncompliant.  Monitor with increased mobility Elevated BP increase lisinopril  to 10mg  on 7/4 Vitals:   06/17/24 1944 06/18/24 0514 06/18/24 1248 06/18/24 1937  BP: 133/77 133/88 107/70 (!) 141/85   06/19/24 0347 06/19/24 1311 06/19/24 2320 06/20/24 0630  BP: (!) 143/85 138/85 (!) 157/85 (!) 172/102   06/20/24 1451 06/20/24 1605 06/20/24 2315 06/21/24 0558  BP: (!) 150/99 (!) 173/96 (!) 148/100 (!) 177/80    15.  Symptomatic left ICA stenosis.  Follow-up outpatient vascular surgery Dr. [name] 16. Bowel management: Now having regular continent stools   LOS: 7 days A FACE TO FACE EVALUATION WAS PERFORMED  Ronald Lowe 06/21/2024, 8:10 AM

## 2024-06-22 DIAGNOSIS — I6522 Occlusion and stenosis of left carotid artery: Secondary | ICD-10-CM

## 2024-06-22 DIAGNOSIS — R410 Disorientation, unspecified: Secondary | ICD-10-CM

## 2024-06-22 NOTE — Progress Notes (Signed)
 Physical Therapy Session Note  Patient Details  Name: Ronald Lowe MRN: [id number] Date of Birth: 11-Oct-1956  Today's Date: 06/22/2024 PT Individual Time: 1003-1115 PT Individual Time Calculation (min): 72 min   Short Term Goals: Week 1:  PT Short Term Goal 1 (Week 1): Pt will complete bed mobility with min assist consistently PT Short Term Goal 2 (Week 1): Pt will complete transfers with min assist consistently PT Short Term Goal 3 (Week 1): Pt will ambulate 150' with min assist consistently PT Short Term Goal 4 (Week 1): Pt will complete up/down 4 steps with BHRs min assist  Skilled Therapeutic Interventions/Progress Updates: Patient L sidelying in bed with wife present on entrance to room. Patient alert and agreeable to PT session.   Patient reported L mid/latissimus dorsi pain/tightness during session (manual therapy + therex performed with pt reporting decrease in pain/tightness). PTA discussed pt's CLOF to pt's wife ahead of family ed with emphasis on pt's safety awareness deficit, especially on R side as pt tends to graze close to objects on R side, as well as pt's fluctuation with confusion/restlessness (PTA encouraged pt's wife to ask questions, if any, to pts attending medical team on Monday about pt's medical presentation.   Therapeutic Activity: Bed Mobility: Pt performed supine<>sit on EOB with supervision.  Transfers: Pt performed sit<>stand transfers throughout session with supervision with no AD in preparation for functional mobility. Pt demonstrated improvement in safely sitting with only requiring one VC throughout session to ensure pt sits with B hips aligned with sitting surface vs at an angle.  Pt ambulated throughout session with no AD and VC to continue to increase awareness to R side to avoid bumping into objects (pt demonstrated improvement this session vs previous sessions). Pt with overall supervision.   Neuromuscular Re-ed: - Pt tossing bean bags to [employer]  with pt taking step to toss with supervision. Pt demonstrated taking stepping strategies to maintain standing balance without intervention (pt, however, presented with some discoordination with reactive response (needing more than one step to catch balance). Pt performed 5 rounds with pt improving reactive response to maintain standing balance with only one moment of crossing L LE over R LE that required CGA for safety. Pt also improved coordination as rounds progressed. Pt tossed bags with R (affected) UE all rounds.  - Pt ambulating around nsg/day room loop tossing big beach ball to rehab tech with pt demonstrating improvement in stepping strategies this session vs previous session with this PTA 2 days ago performing same task. Pt required seated rest break due to R LE fatigue (pt with antalgic-like gait on R LE).  - Pt ambulated 300'+ with CGA while PTA provided perturbations. Pt demonstrated increase in ability to maintain standing balance with appropriate reactive responses when needed.   NMR performed for improvements in motor control and coordination, balance, sequencing, judgement, and self confidence/ efficacy in performing all aspects of mobility at highest level of independence.   Therapeutic Exercise: Pt performed the following exercises following manual therapy. - Passive stretch of L lats  - L UE row with emphases on stretch to L lats with pt cued to maintain 90* bend in L elbow, and when contracting to retract L scapula with eccentric control to follow  Manual Therapy: Palpation of L latissimus dorsi/middle of back paraspinals performed with trigger points/tension noted. Education and rationale provided with pt agreeing to participate in intervention. - Trigger point release to stated area with soft tissue mobilization to follow throughout.  Patient  supine in veil bed at end of session with brakes locked and all needs within reach.      Therapy Documentation Precautions:   Precautions Precautions: Fall, Other (comment) Recall of Precautions/Restrictions: Impaired Precaution/Restrictions Comments: posey belt Restrictions Weight Bearing Restrictions Per Provider Order: No  Therapy/Group: Individual Therapy  Kyle Luppino PTA 06/22/2024, 12:25 PM

## 2024-06-22 NOTE — Progress Notes (Signed)
 Physical Therapy Weekly Progress Note  Patient Details  Name: Ronald Lowe MRN: [id number] Date of Birth: 1956/10/25  Beginning of progress report period: June 15, 2024 End of progress report period: June 22, 2024  Patient has met 3 of 4 short term goals. Pt is making functional progress towards LTG's, and has improved since evaluation by demonstrating ambulation with supervision (increase R LE clearance and step length and L weight shift), and performs transfers with supervision and bed mobility independently. Pt continues to demonstrate deceased R sided attention, and occasionally bumps into objects on R, but has improved since evaluation with pt aware of deficit. Pt set to have family education 7/7, and will perform steps to assess that day as well with hand rail per environment set up.  Patient continues to demonstrate the following deficits {impairments:3041632} and therefore will continue to benefit from skilled PT intervention to increase functional independence with mobility.  Patient {LTG progression:3041653}.  {plan of rjmz:6958345}  PT Short Term Goals Week 1:  PT Short Term Goal 1 (Week 1): Pt will complete bed mobility with min assist consistently PT Short Term Goal 1 - Progress (Week 1): Met PT Short Term Goal 2 (Week 1): Pt will complete transfers with min assist consistently PT Short Term Goal 2 - Progress (Week 1): Met PT Short Term Goal 3 (Week 1): Pt will ambulate 150' with min assist consistently PT Short Term Goal 3 - Progress (Week 1): Met PT Short Term Goal 4 (Week 1): Pt will complete up/down 4 steps with BHRs min assist PT Short Term Goal 4 - Progress (Week 1): Met  Skilled Therapeutic Interventions/Progress Updates:      Therapy Documentation Precautions:  Precautions Precautions: Fall, Other (comment) Recall of Precautions/Restrictions: Impaired Precaution/Restrictions Comments: posey belt Restrictions Weight Bearing Restrictions Per Provider Order:  No   Tila Millirons PTA   06/22/2024, 1:01 PM

## 2024-06-22 NOTE — Progress Notes (Signed)
 PROGRESS NOTE   Subjective/Complaints:  Pt doing ok, but slept poorly d/t having a nightmare per wife. Woke up at 3am.  Denies pain. LBM 1hr ago per pt but not documented. Urinating fine. No other complaints or concerns.    ROS: limited by cognition/speech.   Objective:   No results found. No results for input(s): WBC, HGB, HCT, PLT in the last 72 hours.   No results for input(s): NA, K, CL, CO2, GLUCOSE, BUN, CREATININE, CALCIUM  in the last 72 hours.        Intake/Output Summary (Last 24 hours) at 06/22/2024 1214 Last data filed at 06/22/2024 0723 Gross per 24 hour  Intake 240 ml  Output 100 ml  Net 140 ml        Physical Exam: Vital Signs Blood pressure (!) 153/91, pulse 74, temperature 98.2 F (36.8 C), temperature source Oral, resp. rate 20, weight 64.8 kg, SpO2 99%.   General: No acute distress, sitting up in chair with wife at bedside Mood and affect are appropriate mostly, gets a little irritated at his wife midway though but remains calm Heart: Regular rate and rhythm no rubs murmurs or extra sounds Lungs: Clear to auscultation, breathing unlabored, no rales or wheezes, getting nebs during eval Abdomen: Positive bowel sounds, soft nontender to palpation, nondistended Extremities: No clubbing, cyanosis, or edema Skin: No evidence of breakdown, no evidence of rash over exposed surfaces Neuro: dysarthria; mostly fluent speech. Needs some repetition to follow simple commands    PRIOR EXAMS:  Oriented to person and place but not time DTRs: Reflexes were 2+ in bilateral achilles, patella, biceps, BR and triceps. Able to name pen, glasses and stethoscope Sensory exam: difficult to assess due to aphasia and reduced concentration  RIght neglect +/- HH noted  Motor exam: Poor participation; grossly 4+/5 RUE and RLE; 5/5 LUE and LLE Coordination: ataxic RUE        Neurological:      Comments: Patient is alert and makes eye contact with examiner.  Speech is dysarthric.  He was able to answer some basic questions with some delay in processing and very limited medical historian.  Assessment/Plan: 1. Functional deficits which require 3+ hours per day of interdisciplinary therapy in a comprehensive inpatient rehab setting. Physiatrist is providing close team supervision and 24 hour management of active medical problems listed below. Physiatrist and rehab team continue to assess barriers to discharge/monitor patient progress toward functional and medical goals  Care Tool:  Bathing    Body parts bathed by patient: Right arm, Left arm, Chest, Abdomen, Front perineal area, Buttocks, Right upper leg, Face, Left lower leg, Right lower leg, Left upper leg   Body parts bathed by helper: Right arm, Left arm, Face, Left lower leg, Right lower leg, Left upper leg, Right upper leg, Buttocks, Front perineal area, Abdomen, Chest     Bathing assist Assist Level: Contact Guard/Touching assist     Upper Body Dressing/Undressing Upper body dressing   What is the patient wearing?: Pull over shirt    Upper body assist Assist Level: Set up assist    Lower Body Dressing/Undressing Lower body dressing      What is the patient  wearing?: Pants, Underwear/pull up     Lower body assist Assist for lower body dressing: Moderate Assistance - Patient 50 - 74%     Toileting Toileting    Toileting assist Assist for toileting: Contact Guard/Touching assist     Transfers Chair/bed transfer  Transfers assist     Chair/bed transfer assist level: Contact Guard/Touching assist     Locomotion Ambulation   Ambulation assist      Assist level: Minimal Assistance - Patient > 75% Assistive device: Hand held assist Max distance: 180   Walk 10 feet activity   Assist     Assist level: Minimal Assistance - Patient > 75% Assistive device: Hand held assist   Walk 50 feet  activity   Assist    Assist level: Minimal Assistance - Patient > 75% Assistive device: Hand held assist    Walk 150 feet activity   Assist    Assist level: Minimal Assistance - Patient > 75% Assistive device: Hand held assist    Walk 10 feet on uneven surface  activity   Assist Walk 10 feet on uneven surfaces activity did not occur: Safety/medical concerns         Wheelchair     Assist Is the patient using a wheelchair?: Yes Type of Wheelchair: Manual    Wheelchair assist level: Total Assistance - Patient < 25% Max wheelchair distance: 150'    Wheelchair 50 feet with 2 turns activity    Assist        Assist Level: Total Assistance - Patient < 25%   Wheelchair 150 feet activity     Assist      Assist Level: Total Assistance - Patient < 25%   Blood pressure (!) 153/91, pulse 74, temperature 98.2 F (36.8 C), temperature source Oral, resp. rate 20, weight 64.8 kg, SpO2 99%.  Medical Problem List and Plan: 1. Functional deficits secondary to left MCA scattered infarct likely etiology due to symptomatic proximal ICA stenosis             -patient may  shower             -ELOS/Goals: 7/8 Min A PT/OT/SLP             -Continue CIR     2.  Antithrombotics: -DVT/anticoagulation:  Pharmaceutical: Lovenox  40mg  daily  -antiplatelet therapy: Aspirin  81 mg daily and Plavix  75 mg daily until after left carotid surgery  3. Pain Management: Tylenol  as needed 4. Mood/Behavior/Sleep/alcohol withdrawal/delirium: Provide emotional support             -antipsychotic agents: seroquel  25 mg at bedtime + BID PRN              - Veil bed for high fall risk, attempting OOB, confusion              - delirium precautions              - add sleep log, trazodone  50 mg at bedtime PRN for sleep Continue enclosure bed for now will try to d/c Mon or Tues  5. Neuropsych/cognition: This patient is not capable of making decisions on his own behalf. 6. Skin/Wound Care:  Routine skin checks 7. Fluids/Electrolytes/Nutrition: Routine in and outs with follow-up chemistries -Continue vitamins and supplements Appetitie ~50% meals cont to encourage , cont BID Ensure  8.  Dysphagia.  Dysphagia #3 thin liquids.  Follow-up speech therapy 9.  COPD/tobacco use.  Continue inhalers.  NicoDerm patch.  Provide counseling 10.  PAF.  Noncompliant anticoagulation.  Cardiac rate controlled 11.  History of alcohol use.  Monitor for any signs of withdrawal.  Provide counseling Cont thiamine  100mg  po daily 12.  AKI/CKD.  Follow-up chemistries Monday 13.  GERD.  Protonix  40mg  daily 14.  Hypertension.  Patient on lisinopril  5 mg day prior to admission but noncompliant.  Monitor with increased mobility Elevated BP increase lisinopril  to 10mg  on 7/4  -06/22/24 BPs a little up still, but downtrending with recent med change Vitals:   06/18/24 1937 06/19/24 0347 06/19/24 1311 06/19/24 2320  BP: (!) 141/85 (!) 143/85 138/85 (!) 157/85   06/20/24 0630 06/20/24 1451 06/20/24 1605 06/20/24 2315  BP: (!) 172/102 (!) 150/99 (!) 173/96 (!) 148/100   06/21/24 0558 06/21/24 1440 06/21/24 1929 06/22/24 0413  BP: (!) 177/80 (!) 154/88 (!) 143/99 (!) 153/91    15.  Symptomatic left ICA stenosis.  Follow-up outpatient vascular surgery Dr. [name] 16. Bowel management: Now having regular continent stools  -06/22/24 per pt had BM today but not documented well; monitor for now   LOS: 8 days A FACE TO FACE EVALUATION WAS PERFORMED  [address] 06/22/2024, 12:14 PM

## 2024-06-22 NOTE — Plan of Care (Signed)
  Problem: Consults Goal: RH STROKE PATIENT EDUCATION Description: See Patient Education module for education specifics  Outcome: Progressing   Problem: RH BOWEL ELIMINATION Goal: RH STG MANAGE BOWEL WITH ASSISTANCE Description: STG Manage Bowel with with min Assistance. Outcome: Progressing Goal: RH STG MANAGE BOWEL W/MEDICATION W/ASSISTANCE Description: STG Manage Bowel with Medication with min Assistance. Outcome: Progressing   Problem: RH BLADDER ELIMINATION Goal: RH STG MANAGE BLADDER WITH ASSISTANCE Description: STG Manage Bladder With min Assistance Outcome: Progressing   Problem: RH SAFETY Goal: RH STG ADHERE TO SAFETY PRECAUTIONS W/ASSISTANCE/DEVICE Description: STG Adhere to Safety Precautions With min Assistance/Device. Outcome: Progressing   Problem: RH COGNITION-NURSING Goal: RH STG USES MEMORY AIDS/STRATEGIES W/ASSIST TO PROBLEM SOLVE Description: STG Uses Memory Aids/Strategies With supervision Assistance to Problem Solve. Outcome: Progressing   Problem: RH PAIN MANAGEMENT Goal: RH STG PAIN MANAGED AT OR BELOW PT'S PAIN GOAL Description: Pain < 4 with prns Outcome: Progressing   Problem: RH KNOWLEDGE DEFICIT Goal: RH STG INCREASE KNOWLEDGE OF HYPERTENSION Description: Patient and wife will be able to manage HTN using educational resources for medications and dietary modification independently Outcome: Progressing Goal: RH STG INCREASE KNOWLEDGE OF DYSPHAGIA/FLUID INTAKE Description: Patient and wife will be able to manage dysphagia using educational resources for medications and dietary modification independently Outcome: Progressing Goal: RH STG INCREASE KNOWLEGDE OF HYPERLIPIDEMIA Description: Patient and wife will be able to manage HLD using educational resources for medications and dietary modification independently Outcome: Progressing Goal: RH STG INCREASE KNOWLEDGE OF STROKE PROPHYLAXIS Description: Patient and wife will be able to manage secondary  risks using educational resources for medications and dietary modification independently Outcome: Progressing   Problem: Safety: Goal: Non-violent Restraint(s) Outcome: Progressing

## 2024-06-22 NOTE — Progress Notes (Signed)
 Occupational Therapy Session Note  Patient Details  Name: LANI MENDIOLA MRN: [id number] Date of Birth: [birth year]  Today's Date: 06/22/2024 OT Individual Time: 0807-0900 OT Individual Time Calculation (min): 53 min    Short Term Goals: Week 1:  OT Short Term Goal 1 (Week 1): Pt will complete UB bathing with min A OT Short Term Goal 1 - Progress (Week 1): Met OT Short Term Goal 2 (Week 1): Pt will complete toilet transfers with min A OT Short Term Goal 2 - Progress (Week 1): Met OT Short Term Goal 3 (Week 1): Pt will attend to functional task in quiet environment for >1 min with SUP OT Short Term Goal 3 - Progress (Week 1): Met OT Short Term Goal 4 (Week 1): Pt will participate in additional vision assessment with SUP OT Short Term Goal 4 - Progress (Week 1): Progressing toward goal  Skilled Therapeutic Interventions/Progress Updates:   Patient resting in bed at the time of arrival with family present.  Patient reports  sleeping through the night with no report of pain at the time of treatment. The pt  was able to come from bed LOF to EOB with CGA. The pt was able to transfer from the EOB to w/c LOF with CGA. The pt was able to use his feet to travel to the sink at w/c LOF with vc's.  The pt was able to cleanse  his mouth and wash his face with s/uA.  The pt went on to complete a simulated task in UB/LB dressing with  theraband. The pt was able to donn and doff the theraband on the UB , incorporating the hemi techniques secondary to shld contracture with the L shld SBA. The pt was able to donn/ doff the theraband for LB with initial demonstration and vc's using opposite arm, opposite leg at MinA.  The pt was able to come from sit to stand with CGA for ambulating to the restroom. The pt was able to doff his LB garments, a brief and pants with SBA.  The pt was able to have a BM and complete toileting hygiene with close S. The pt was able to donn his LB garments with SBA.  The pt was able to ambulate  to the sink area and wash his hands with close S. The pt went on to complete UB exercise using a 3lb dumb bell 1 set of 10 for BUE.  At the end of the session, the pt was able sit EOB with the bed side table out front and his call light nearby and his bed alarm activated.   Therapy Documentation Precautions:  Precautions Precautions: Fall, Other (comment) Recall of Precautions/Restrictions: Impaired Precaution/Restrictions Comments: posey belt Restrictions Weight Bearing Restrictions Per Provider Order: No  Therapy/Group: Individual Therapy  Elvera JONETTA Mace 06/22/2024, 3:46 PM

## 2024-06-22 NOTE — Progress Notes (Signed)
 [facility]  Subjective  -making progress in CIR.  Still some mild agitation requiring enclosure bed at night.   Objective (!) 153/91 74 98.2 F (36.8 C) (Oral) 20 99%  Intake/Output Summary (Last 24 hours) at 06/22/2024 0852 Last data filed at 06/22/2024 0723 Gross per 24 hour  Intake 240 ml  Output 100 ml  Net 140 ml    Right upper and lower extremity 4+ out of 5 and left upper and lower extremity 5 out of 5  Laboratory Lab Results: No results for input(s): WBC, HGB, HCT, PLT in the last 72 hours. BMET No results for input(s): NA, K, CL, CO2, GLUCOSE, BUN, CREATININE, CALCIUM  in the last 72 hours.  COAG Lab Results  Component Value Date   INR 1.0 06/06/2024   INR 1.0 11/17/2021   INR 1.0 04/07/2021   No results found for: PTT  Assessment/Planning:  Patient was seen in consultation in the hospital for symptomatic left carotid stenosis and offered carotid endarterectomy.  Unfortunately he had ongoing cognitive issues with confusion.  We ultimately elected to delay carotid surgery until his mental status improved and he went to CIR.  Making progress.  More oriented today.  Still some agitation requiring enclosure bed at night.  Discussed with him and his wife still plan for carotid endarterectomy and family states they would prefer to get home first and are looking at discharge early next week from rehab.  I will arrange follow-up in 2 to 3 weeks with me or Dr. [name] pending availability to discuss carotid endarterectomy.  Call vascular with questions or concerns.  Ronald Lowe 06/22/2024 8:52 AM --

## 2024-06-23 NOTE — Progress Notes (Signed)
 Occupational Therapy Session Note  Patient Details  Name: Ronald Lowe MRN: [id number] Date of Birth: 1956-02-20  Today's Date: 06/23/2024 OT Individual Time: 9199-9154 OT Individual Time Calculation (min): 45 min    Short Term Goals: Week 1:  OT Short Term Goal 1 (Week 1): Pt will complete UB bathing with min A OT Short Term Goal 1 - Progress (Week 1): Met OT Short Term Goal 2 (Week 1): Pt will complete toilet transfers with min A OT Short Term Goal 2 - Progress (Week 1): Met OT Short Term Goal 3 (Week 1): Pt will attend to functional task in quiet environment for >1 min with SUP OT Short Term Goal 3 - Progress (Week 1): Met OT Short Term Goal 4 (Week 1): Pt will participate in additional vision assessment with SUP OT Short Term Goal 4 - Progress (Week 1): Progressing toward goal  Skilled Therapeutic Interventions/Progress Updates:    Patient in bed at the time of arrival, Patient indicated that he rested during the night with no report of pain at the time of treatment. Patient in agreement with completing shower in preparation for  his day. The pt was able to ambulate to the shower with CGA free of device.The pt was able to doff his UB/LB clothing items, a over head shirt and shorts with close S. The pt was able to stand for toileting using the grab bar with close S. The pt was able to ambulate to the shower with CGA for placement on the shower chair.  The pt was able to  bath his UB/LB with vc's for attention to task and  close S  for safety.  The pt was able to dry himself off and  donn his UB clothing items,  a over head shirt  with close S.  The pt was able to donn his LB clothing items, underwear and shorts with close S. The pt was able to apply deo with s/uA.  The pt was able to ambulate from the restroom to the sink with close S. The pt was able to clean his mouth with close S.  The patient was able to walk  down the hall > 50 ft and return to his room with CGA. At the end of the  session, the pt returned to bed LOF with all additional needs addressed prior to exiting the room.    Therapy Documentation Precautions:  Precautions Precautions: Fall, Other (comment) Recall of Precautions/Restrictions: Impaired Precaution/Restrictions Comments: posey belt Restrictions Weight Bearing Restrictions Per Provider Order: No  Therapy/Group: Individual Therapy  Elvera JONETTA Mace 06/23/2024, 4:22 PM

## 2024-06-23 NOTE — Progress Notes (Signed)
 PROGRESS NOTE   Subjective/Complaints:  Pt doing fine, slept well. Denies pain. Pt unsure of LBM, though it was 3 days ago but yesterday he said he'd had a BM. Documentation states LBM 7/4. Urinating fine. No other complaints or concerns but history limited d/t cognition.    ROS: limited by cognition/speech.   Objective:   No results found. No results for input(s): WBC, HGB, HCT, PLT in the last 72 hours.   No results for input(s): NA, K, CL, CO2, GLUCOSE, BUN, CREATININE, CALCIUM  in the last 72 hours.        Intake/Output Summary (Last 24 hours) at 06/23/2024 1118 Last data filed at 06/23/2024 0750 Gross per 24 hour  Intake 945 ml  Output --  Net 945 ml        Physical Exam: Vital Signs Blood pressure (!) 166/114, pulse 69, temperature 98.2 F (36.8 C), temperature source Oral, resp. rate 18, weight 64.8 kg, SpO2 100%.   General: No acute distress, resting in enclosure bed, comfortable Mood and affect are appropriate mostly, easily redirected during exam Heart: Regular rate and rhythm no rubs murmurs or extra sounds Lungs: Clear to auscultation, breathing unlabored, no rales or wheezes, getting nebs during eval Abdomen: Positive bowel sounds, soft nontender to palpation, nondistended Extremities: No clubbing, cyanosis, or edema Skin: No evidence of breakdown, no evidence of rash over exposed surfaces Neuro: dysarthria; mostly fluent speech, more intelligible than prior weekend. Needs some repetition to follow simple commands. Easily redirected today but a little impulsive at times.    PRIOR EXAMS:  Oriented to person and place but not time DTRs: Reflexes were 2+ in bilateral achilles, patella, biceps, BR and triceps. Able to name pen, glasses and stethoscope Sensory exam: difficult to assess due to aphasia and reduced concentration  RIght neglect +/- HH noted  Motor exam: Poor  participation; grossly 4+/5 RUE and RLE; 5/5 LUE and LLE Coordination: ataxic RUE        Neurological:     Comments: Patient is alert and makes eye contact with examiner.  Speech is dysarthric.  He was able to answer some basic questions with some delay in processing and very limited medical historian.  Assessment/Plan: 1. Functional deficits which require 3+ hours per day of interdisciplinary therapy in a comprehensive inpatient rehab setting. Physiatrist is providing close team supervision and 24 hour management of active medical problems listed below. Physiatrist and rehab team continue to assess barriers to discharge/monitor patient progress toward functional and medical goals  Care Tool:  Bathing    Body parts bathed by patient: Right arm, Left arm, Chest, Abdomen, Front perineal area, Buttocks, Right upper leg, Face, Left lower leg, Right lower leg, Left upper leg   Body parts bathed by helper: Right arm, Left arm, Face, Left lower leg, Right lower leg, Left upper leg, Right upper leg, Buttocks, Front perineal area, Abdomen, Chest     Bathing assist Assist Level: Contact Guard/Touching assist     Upper Body Dressing/Undressing Upper body dressing   What is the patient wearing?: Pull over shirt    Upper body assist Assist Level: Set up assist    Lower Body Dressing/Undressing Lower body dressing  What is the patient wearing?: Pants, Underwear/pull up     Lower body assist Assist for lower body dressing: Moderate Assistance - Patient 50 - 74%     Toileting Toileting    Toileting assist Assist for toileting: Contact Guard/Touching assist     Transfers Chair/bed transfer  Transfers assist     Chair/bed transfer assist level: Supervision/Verbal cueing     Locomotion Ambulation   Ambulation assist      Assist level: Supervision/Verbal cueing Assistive device: Hand held assist Max distance: 200   Walk 10 feet activity   Assist     Assist  level: Supervision/Verbal cueing Assistive device: No Device   Walk 50 feet activity   Assist    Assist level: Supervision/Verbal cueing Assistive device: No Device    Walk 150 feet activity   Assist    Assist level: Supervision/Verbal cueing Assistive device: No Device    Walk 10 feet on uneven surface  activity   Assist Walk 10 feet on uneven surfaces activity did not occur: Safety/medical concerns   Assist level: Supervision/Verbal cueing Assistive device:  (no device)   Wheelchair     Assist Is the patient using a wheelchair?: Yes Type of Wheelchair: Manual    Wheelchair assist level: Total Assistance - Patient < 25% Max wheelchair distance: 150'    Wheelchair 50 feet with 2 turns activity    Assist        Assist Level: Total Assistance - Patient < 25%   Wheelchair 150 feet activity     Assist      Assist Level: Total Assistance - Patient < 25%   Blood pressure (!) 166/114, pulse 69, temperature 98.2 F (36.8 C), temperature source Oral, resp. rate 18, weight 64.8 kg, SpO2 100%.  Medical Problem List and Plan: 1. Functional deficits secondary to left MCA scattered infarct likely etiology due to symptomatic proximal ICA stenosis             -patient may  shower             -ELOS/Goals: 7/8 Min A PT/OT/SLP             -Continue CIR     2.  Antithrombotics: -DVT/anticoagulation:  Pharmaceutical: Lovenox  40mg  daily  -antiplatelet therapy: Aspirin  81 mg daily and Plavix  75 mg daily until after left carotid surgery  3. Pain Management: Tylenol  as needed 4. Mood/Behavior/Sleep/alcohol withdrawal/delirium: Provide emotional support             -antipsychotic agents: seroquel  25 mg at bedtime + BID PRN              - Veil bed for high fall risk, attempting OOB, confusion              - delirium precautions              - add sleep log, trazodone  50 mg at bedtime PRN for sleep Continue enclosure bed for now will try to d/c Mon or Tues--  still a bit impulsive today 7/6 but more redirectable 5. Neuropsych/cognition: This patient is not capable of making decisions on his own behalf. 6. Skin/Wound Care: Routine skin checks 7. Fluids/Electrolytes/Nutrition: Routine in and outs with follow-up chemistries -Continue vitamins and supplements Appetitie ~50% meals cont to encourage , cont BID Ensure  8.  Dysphagia.  Dysphagia #3 thin liquids.  Follow-up speech therapy 9.  COPD/tobacco use.  Continue inhalers.  NicoDerm patch.  Provide counseling 10.  PAF.  Noncompliant anticoagulation.  Cardiac rate controlled 11.  History of alcohol use.  Monitor for any signs of withdrawal.  Provide counseling Cont thiamine  100mg  po daily 12.  AKI/CKD.  Follow-up chemistries Monday 13.  GERD.  Protonix  40mg  daily 14.  Hypertension.  Patient on lisinopril  5 mg day prior to admission but noncompliant.  Monitor with increased mobility -Elevated BP increase lisinopril  to 10mg  on 7/4  -7/5-6/25 BPs a little up still, but downtrending with recent med change Vitals:   06/19/24 2320 06/20/24 0630 06/20/24 1451 06/20/24 1605  BP: (!) 157/85 (!) 172/102 (!) 150/99 (!) 173/96   06/20/24 2315 06/21/24 0558 06/21/24 1440 06/21/24 1929  BP: (!) 148/100 (!) 177/80 (!) 154/88 (!) 143/99   06/22/24 0413 06/22/24 1429 06/22/24 1945 06/23/24 0521  BP: (!) 153/91 117/82 137/88 (!) 166/114    15.  Symptomatic left ICA stenosis.  Follow-up outpatient vascular surgery Dr. [name] 16. Bowel management: Now having regular continent stools  -06/22/24 per pt had BM today but not documented well; monitor for now  -06/23/24 LBM 7/4 per documentation; monitor.    LOS: 9 days A FACE TO FACE EVALUATION WAS PERFORMED  [address] 06/23/2024, 11:18 AM

## 2024-06-24 ENCOUNTER — Other Ambulatory Visit (HOSPITAL_COMMUNITY): Payer: Self-pay

## 2024-06-24 LAB — CBC
HCT: 38.2 % — ABNORMAL LOW (ref 39.0–52.0)
Hemoglobin: 13.6 g/dL (ref 13.0–17.0)
MCH: 36.7 pg — ABNORMAL HIGH (ref 26.0–34.0)
MCHC: 35.6 g/dL (ref 30.0–36.0)
MCV: 103 fL — ABNORMAL HIGH (ref 80.0–100.0)
Platelets: 383 K/uL (ref 150–400)
RBC: 3.71 MIL/uL — ABNORMAL LOW (ref 4.22–5.81)
RDW: 12.6 % (ref 11.5–15.5)
WBC: 6.4 K/uL (ref 4.0–10.5)
nRBC: 0 % (ref 0.0–0.2)

## 2024-06-24 LAB — BASIC METABOLIC PANEL WITH GFR
Anion gap: 8 (ref 5–15)
BUN: 12 mg/dL (ref 8–23)
CO2: 22 mmol/L (ref 22–32)
Calcium: 8.9 mg/dL (ref 8.9–10.3)
Chloride: 105 mmol/L (ref 98–111)
Creatinine, Ser: 1.09 mg/dL (ref 0.61–1.24)
GFR, Estimated: >60 mL/min (ref >60)
Glucose, Bld: 101 mg/dL — ABNORMAL HIGH (ref 70–99)
Potassium: 3.9 mmol/L (ref 3.5–5.1)
Sodium: 135 mmol/L (ref 135–145)

## 2024-06-24 MED ORDER — QUETIAPINE FUMARATE 25 MG PO TABS
25.0000 mg | ORAL_TABLET | Freq: Two times a day (BID) | ORAL | 0 refills | Status: DC | PRN
  Filled 2024-06-24: qty 30, 15d supply, fill #0

## 2024-06-24 MED ORDER — FAMOTIDINE 40 MG PO TABS
40.0000 mg | ORAL_TABLET | Freq: Every day | ORAL | 0 refills | Status: DC
  Filled 2024-06-24: qty 30, 30d supply, fill #0

## 2024-06-24 MED ORDER — THIAMINE HCL 100 MG/ML IJ SOLN
100.0000 mg | Freq: Every day | INTRAMUSCULAR | 0 refills | Status: DC
  Filled 2024-06-24: qty 30, 30d supply, fill #0

## 2024-06-24 MED ORDER — LISINOPRIL 20 MG PO TABS
20.0000 mg | ORAL_TABLET | Freq: Every day | ORAL | Status: DC
  Administered 2024-06-25: 20 mg via ORAL
  Filled 2024-06-24: qty 1

## 2024-06-24 MED ORDER — CYANOCOBALAMIN 1000 MCG PO TABS
1000.0000 ug | ORAL_TABLET | Freq: Every day | ORAL | 0 refills | Status: DC
  Filled 2024-06-24: qty 30, 30d supply, fill #0

## 2024-06-24 MED ORDER — FOLIC ACID 1 MG PO TABS
1.0000 mg | ORAL_TABLET | Freq: Every day | ORAL | 0 refills | Status: DC
  Filled 2024-06-24: qty 30, 30d supply, fill #0

## 2024-06-24 MED ORDER — LISINOPRIL 10 MG PO TABS
10.0000 mg | ORAL_TABLET | Freq: Once | ORAL | Status: AC
  Administered 2024-06-24: 10 mg via ORAL
  Filled 2024-06-24: qty 1

## 2024-06-24 MED ORDER — LISINOPRIL 10 MG PO TABS
10.0000 mg | ORAL_TABLET | Freq: Every day | ORAL | 0 refills | Status: DC
  Filled 2024-06-24: qty 30, 30d supply, fill #0

## 2024-06-24 MED ORDER — CLOPIDOGREL BISULFATE 75 MG PO TABS
75.0000 mg | ORAL_TABLET | Freq: Every day | ORAL | 0 refills | Status: DC
  Filled 2024-06-24: qty 30, 30d supply, fill #0

## 2024-06-24 MED ORDER — LISINOPRIL 20 MG PO TABS
20.0000 mg | ORAL_TABLET | Freq: Every day | ORAL | 0 refills | Status: DC
  Filled 2024-06-24: qty 30, 30d supply, fill #0

## 2024-06-24 MED ORDER — TRAZODONE HCL 50 MG PO TABS
50.0000 mg | ORAL_TABLET | Freq: Every evening | ORAL | 0 refills | Status: DC | PRN
  Filled 2024-06-24: qty 30, 30d supply, fill #0

## 2024-06-24 MED ORDER — ALBUTEROL SULFATE HFA 108 (90 BASE) MCG/ACT IN AERS
1.0000 | INHALATION_SPRAY | Freq: Four times a day (QID) | RESPIRATORY_TRACT | 5 refills | Status: DC | PRN
Start: 2024-06-24 — End: 2024-09-25
  Filled 2024-06-24: qty 6.7, 30d supply, fill #0

## 2024-06-24 MED ORDER — ATORVASTATIN CALCIUM 10 MG PO TABS
10.0000 mg | ORAL_TABLET | Freq: Every day | ORAL | 0 refills | Status: DC
  Filled 2024-06-24: qty 30, 30d supply, fill #0

## 2024-06-24 MED ORDER — NICOTINE 14 MG/24HR TD PT24
MEDICATED_PATCH | TRANSDERMAL | 8 refills | Status: DC
  Filled 2024-06-24: qty 28, fill #0

## 2024-06-24 NOTE — Progress Notes (Signed)
 Occupational Therapy Session Note  Patient Details  Name: Ronald Lowe MRN: [id number] Date of Birth: [birth year]  Today's Date: 06/24/2024 OT Individual Time: 1305-1401 OT Individual Time Calculation (min): 56 min    Short Term Goals: Week 2:  OT Short Term Goal 1 (Week 2): STG= LTGs  Skilled Therapeutic Interventions/Progress Updates:  Pt greeted asleep in supine but easily able to arouse and reports your back!, pt agreeable to OT intervention.      Transfers/bed mobility/functional mobility: pt completed supine>sit MODI. Pt completed all functional ambulation with no Ad and supervision, pt continues to need cues to maintain awareness to R side as pt did trip over corner of bed on R side but able to recover LOB.   Therapeutic activity: pt completed dual processing task with pt instructed to walk around the nurses station while balancing bean bag ontop of cone in R hand. While pt was ambulating, asked pt various categorical questions such as what do these 2 words have in common: lincoln, carter, and carver. Pt completed all categorical questions with 90% accuracy.   Pt completed visual scanning task during functional ambulation with pt asked to locate needed items on R side. Pt missed 1 item and has to back track 2x before locating item on his R side.    ADLs:  Grooming: pt completed standing grooming at sink with supervision needing cues to locate paper towels Footwear: pt donned shoes with set- up assist.   Transfers: ambulatory toilet transfer with no AD and supervision.  Toileting: pt completed 3/3 toileting tasks with supervision needing cues to keep toilet seat down to sit to urinate.  Self feeding:     Education:  Pts wife sherry has been present for prior OT sessions but did revies the below topics: -always using gait belt for functional mobility -recommendation on 24/7 supervision for safety with all ADLS and functional mobility  - general assist needed for ADLS and  functional mobility ( I.e supervision) -recommendation to try to not sit for longer than 1 hour at a time -general education provided on R inattention and potential use of highlighter tape to outline door frames on R side -education provided on safety precautions ( sequencing/problem solving through ADLS) with family able to demonstrate understanding -education provided on appropriate cues needed when pt attempting to problem solve through higher level ADL task -decreasing clutter/fall hazards in the home - discussed DME needs; BSC only  Ronald Lowe demonstrated appropriate level of assist with ADLs and functional mobility. [name] assisted pt in/out of tub shower to [facility] with supervision.    Cognition: pt oriented to day of week and year but disoriented to month. Pt oriented to this therapist but did ask is that snow out there? When looking at the bright reflection of the roof top                Ended session with pt supine in bed with all needs within reach and bed alarm activated.                    Therapy Documentation Precautions:  Precautions Precautions: Fall, Other (comment) Recall of Precautions/Restrictions: Impaired Precaution/Restrictions Comments: posey belt Restrictions Weight Bearing Restrictions Per Provider Order: No  Pain: no pain     Therapy/Group: Individual Therapy  Ronald Lowe 06/24/2024, 3:08 PM

## 2024-06-24 NOTE — Progress Notes (Signed)
 Patient ID: Ronald Lowe, male   DOB: 03-Feb-1956, 68 y.o.   MRN: [id number]  SW met with pt and pt wife to discuss family edu to be completed today. Wife reports she is not feeling well but will make an effort to be here until 3:30pm today. Preferred outpatient location is [facility].   SW faxed referral.   Graeme Jude, MSW, LCSW Office: [phone] Cell: [phone] Fax: [phone]

## 2024-06-24 NOTE — Progress Notes (Signed)
 Physical Therapy Session Note  Patient Details  Name: Ronald Lowe MRN: [id number] Date of Birth: 05/14/1956  Today's Date: 06/24/2024 PT Individual Time: 1500-1530 PT Individual Time Calculation (min): 30 min   Short Term Goals: Week 2:  PT Short Term Goal 1 (Week 2): STG = LTG due to ELOS  Skilled Therapeutic Interventions/Progress Updates:       Pt up in room without staff assistance on arrival - pt wearing regular socks (not hospital non-skid or shoes). Pt educated on fall prevention and need for proper shoe wear to prevent slip/fall. Pt also educated on need for staff assistance for all mobility given unsteadiness and decreased safety awareness.  Pt ambulated >257ft with supervision and no AD through rehab hallways with cues for safety awareness and R attention. Pt able to complete car transfer at supervision level with general cues for safety awareness and for approach. Reminded patient to abstain from driving for safety concerns and to speak with his MD before clearance. Pt in agreement. Pt able to ambulate through hospital at supervision level - taken outside to practice unlevel gait which he did so at supervision level. Continued with ongoing education on fall prevention, monitoring his energy levels, and becoming more aware of environment in unfamiliar space.   Pt instructed on returning to CIR floor from 1st floor. Patient needing max cues for direction and max question cues for orientation on directions. Pt struggled with reading basic signs and following these directions to lead back to his room. Patient ended session sitting in recliner with his needs met, chair pad alarm on, call bell within reach.    Therapy Documentation Precautions:  Precautions Precautions: Fall, Other (comment) Recall of Precautions/Restrictions: Impaired Precaution/Restrictions Comments: posey belt Restrictions Weight Bearing Restrictions Per Provider Order: No   Therapy/Group: Individual  Therapy  Sherlean SHAUNNA Perks 06/24/2024, 12:24 PM

## 2024-06-24 NOTE — Progress Notes (Signed)
 Speech Language Pathology Discharge Summary  Patient Details  Name: Ronald Lowe MRN: [id number] Date of Birth: 07-Apr-1956  Date of Discharge from SLP service:June 24, 2024  Today's Date: 06/24/2024 SLP Individual Time: 1400-1444 SLP Individual Time Calculation (min): 44 min   Skilled Therapeutic Interventions:   Skilled therapy session focused on cognitive and communication goals. This session was scheduled for family education, though patients wife felt ill and therefore left. SLP instead completed re-evaluation of patients communication and cognitive skills. Patient oriented to self, situation, location (independently) and general time (given minA). SLP targeted memory goals through paragraph retention and delayed recall task. Initially, patient answered comprehension questions with 50% accuracy, however with use of repetition, patient recalled answers after a 10 minute delay with 100% accuracy.  SLP targeted communication through confrontational and responsive naming task. Patient scored with 100% accuracy. Patient with increased frustration and difficulty during generational naming tasks (animals, fruits) naming 7 in one minute span. Patient aware of difficulty and required minA to sustain attention to task. SLP reviewed results of re-evaluation and patient with no questions or concerns. Patient left in bed with alarm set and call bell in reach.      Patient has met 5 of 7 long term goals.  Patient to discharge at overall Min;Supervision;Mod level.  Reasons goals not met: cont to require increased cues for awareness and memory   Clinical Impression/Discharge Summary:  Pt has made fair gains and has met 5 of 7 LTG's this admission due to improved swallowing, communication and cognition. Pt is currently an overall supervision-minA for orientation, attention and communication (speech intelligibility, expression, reception) however continues to require at least modA for emergent awareness and  memory. Patient requires supervision cues for utilization of swallowing compensatory strategies to minimize overt s/sx of aspiration with D3/thin diet. Pt/family education complete and pt will discharge home with 24 hour supervision from friends/family/etc. Pt would benefit from f/u ST services to maximize communication and cognition in order to maximize functional independence.   Care Partner:  Caregiver Able to Provide Assistance: Yes  Type of Caregiver Assistance: Physical;Cognitive  Recommendation:  Outpatient SLP;24 hour supervision/assistance  Rationale for SLP Follow Up: Maximize cognitive function and independence;Maximize functional communication   Equipment: n/a   Reasons for discharge: Discharged from hospital   Patient/Family Agrees with Progress Made and Goals Achieved: Yes    Staisha Winiarski M.A., CCC-SLP 06/24/2024, 2:35 PM

## 2024-06-24 NOTE — Progress Notes (Signed)
 Physical Therapy Discharge Summary  Patient Details  Name: Ronald Lowe MRN: [id number] Date of Birth: [birth year]  Date of Discharge from PT service:June 24, 2024  Patient has met 8 of 8 long term goals due to improved activity tolerance, improved balance, improved postural control, increased strength, ability to compensate for deficits, functional use of  right upper extremity and right lower extremity, improved attention, improved awareness, and improved coordination.  Patient to discharge at an ambulatory level Supervision.   Patient's care partner is independent to provide the necessary physical assistance at discharge.  Reasons goals not met: n/a  Functional Outcome Measures: BERG 44/56 5xSTS 14.5 seconds  Recommendation:  Patient will benefit from ongoing skilled PT services in outpatient setting to continue to advance safe functional mobility, address ongoing impairments in R sided awareness, dynamic standing and gait, safety awareness, dual-cog tasks, and minimize fall risk.  Equipment: No equipment provided  Reasons for discharge: treatment goals met and discharge from hospital  Patient/family agrees with progress made and goals achieved: Yes  PT Discharge Precautions/Restrictions Restrictions Weight Bearing Restrictions Per Provider Order: No Pain Interference Pain Interference Pain Effect on Sleep: 0. Does not apply - I have not had any pain or hurting in the past 5 days Pain Interference with Therapy Activities: 0. Does not apply - I have not received rehabilitationtherapy in the past 5 days Pain Interference with Day-to-Day Activities: 1. Rarely or not at all Vision/Perception  Vision - History Ability to See in Adequate Light: 0 Adequate Vision - Assessment Eye Alignment: Within Functional Limits Ocular Range of Motion: Within Functional Limits Alignment/Gaze Preference: Within Defined Limits Tracking/Visual Pursuits: Able to track stimulus in all quads without  difficulty Saccades: Within functional limits Convergence: Within functional limits Additional Comments: pt requried MAX multimodal cues to follow commands related to visual assessments Perception Perception: Impaired Preception Impairment Details: Inattention/Neglect Perception-Other Comments: R inattention but improved from eval  Cognition Overall Cognitive Status: Impaired/Different from baseline Orientation Level: Oriented X4 Sensation Sensation Light Touch: Appears Intact Hot/Cold: Appears Intact Proprioception: Impaired by gross assessment Stereognosis: Not tested Coordination Gross Motor Movements are Fluid and Coordinated: No Fine Motor Movements are Fluid and Coordinated: No Motor  Motor Motor: Other (comment) Motor - Skilled Clinical Observations: Mild R sided weakness; ataxic, uncoordinated motor movements  Mobility Bed Mobility Bed Mobility: Supine to Sit;Sit to Supine Supine to Sit: Independent Sit to Supine: Independent Transfers Transfers: Sit to Stand;Stand to Sit;Stand Pivot Transfers Sit to Stand: Independent Stand to Sit: Independent Stand Pivot Transfers: Supervision/Verbal cueing Stand Pivot Transfer Details: Verbal cues for precautions/safety Transfer (Assistive device): None Locomotion  Gait Ambulation: Yes Gait Assistance: Contact Guard/Touching assist Gait Distance (Feet): 250 Feet Assistive device: None Gait Assistance Details: Verbal cues for precautions/safety;Verbal cues for safe use of DME/AE Gait Gait: Yes Gait Pattern: Within Functional Limits Gait Pattern: Decreased stride length;Decreased hip/knee flexion - right;Decreased hip/knee flexion - left (general unsteadiness) Stairs / Additional Locomotion Stairs: Yes Stairs Assistance: Contact Guard/Touching assist Stair Management Technique: One rail Right;Alternating pattern;Forwards Number of Stairs: 12 Height of Stairs: 6 Pick up small object from the floor assist level: Contact  Guard/Touching assist Wheelchair Mobility Wheelchair Mobility: No  Trunk/Postural Assessment  Cervical Assessment Cervical Assessment: Within Functional Limits Thoracic Assessment Thoracic Assessment: Exceptions to [facility] (decreased active rotation L > R, mild kyphosis) Lumbar Assessment Lumbar Assessment: Within Functional Limits Postural Control Postural Control: Deficits on evaluation Righting Reactions: delayed  Balance Balance Balance Assessed: Yes Standardized Balance Assessment Standardized Balance Assessment: Lars  Balance Test Berg Balance Test Sit to Stand: Able to stand without using hands and stabilize independently Standing Unsupported: Able to stand safely 2 minutes Sitting with Back Unsupported but Feet Supported on Floor or Stool: Able to sit safely and securely 2 minutes Stand to Sit: Sits safely with minimal use of hands Transfers: Able to transfer safely, minor use of hands Standing Unsupported with Eyes Closed: Able to stand 10 seconds safely Standing Ubsupported with Feet Together: Able to place feet together independently and stand for 1 minute with supervision From Standing, Reach Forward with Outstretched Arm: Can reach forward >12 cm safely (5) From Standing Position, Pick up Object from Floor: Able to pick up shoe, needs supervision From Standing Position, Turn to Look Behind Over each Shoulder: Looks behind one side only/other side shows less weight shift Turn 360 Degrees: Able to turn 360 degrees safely one side only in 4 seconds or less Standing Unsupported, Alternately Place Feet on Step/Stool: Able to complete >2 steps/needs minimal assist Standing Unsupported, One Foot in Front: Able to plae foot ahead of the other independently and hold 30 seconds Standing on One Leg: Tries to lift leg/unable to hold 3 seconds but remains standing independently Total Score: 44 Extremity Assessment      RLE Assessment RLE Assessment: Within Functional Limits LLE  Assessment LLE Assessment: Within Functional Limits   Carlia Bomkamp P Daine Croker PT, DTP, CSRS 06/24/2024, 11:53 AM

## 2024-06-24 NOTE — Progress Notes (Signed)
 Inpatient Rehabilitation Discharge Medication Review by a Pharmacist  A complete drug regimen review was completed for this patient to identify any potential clinically significant medication issues.  High Risk Drug Classes Is patient taking? Indication by Medication  Antipsychotic Yes Quetiapine  - agitation   Anticoagulant No   Antibiotic No   Opioid No   Antiplatelet Yes Aspirin , Clopidogrel  - CVA  Hypoglycemics/insulin No   Vasoactive Medication Yes Lisinopril  - HTN  Chemotherapy No   Other Yes Trelegy, prn albuterol  - COPD Folic acid , thiamine , cyanocobalamin - supplement Tylenol  - pain Nicotine  patch - tobacco Lipitor - HLD Famotidine  - reflux     Type of Medication Issue Identified Description of Issue Recommendation(s)  Drug Interaction(s) (clinically significant)     Duplicate Therapy     Allergy     No Medication Administration End Date     Incorrect Dose     Additional Drug Therapy Needed     Significant med changes from prior encounter (inform family/care partners about these prior to discharge).    Other       Clinically significant medication issues were identified that warrant physician communication and completion of prescribed/recommended actions by midnight of the next day:  No  Name of provider notified for urgent issues identified:   Provider Method of Notification:     Pharmacist comments: Aspirin  dose to 81 mg per neurology note  Time spent performing this drug regimen review (minutes):  20 minutes  Sergio Batch, PharmD, [place], AAHIVP, CPP Infectious Disease Pharmacist 06/24/2024 8:28 AM

## 2024-06-24 NOTE — Progress Notes (Signed)
 Physical Therapy Session Note  Patient Details  Name: Ronald Lowe MRN: [id number] Date of Birth: Dec 17, 1956  Today's Date: 06/24/2024 PT Individual Time: 1115-1200 PT Individual Time Calculation (min): 45 min   Short Term Goals: Week 2:  PT Short Term Goal 1 (Week 2): STG = LTG due to ELOS  Skilled Therapeutic Interventions/Progress Updates:      Pt sitting in recliner to start with his wife, [name], present throughout session for family education and to prepare for DC tomorrow. [name] nor the patient have any specific questions or concerns re: DC plan. [name] reports she has been with the patient daily and understands the patient's limitations and deficits.   Pt completes sit<>stand independently from recliner. Ambulates with no AD or UE support with his wife providing CGA while guarding on his R side. Pt ambulates ~144ft to main gym. Seated rest break provided prior to initiating stair training. Pt able to navigate up/down x12, 6 steps, using 1 hand rail on his R with a reciprocal stepping pattern for both ascent and descent. CGA needed for stair training for safety and awareness.   Balance testing completed using BERG and 5xSTS. See below for details.  Five times Sit to Stand Test (FTSS) Method: Use a straight back chair with a solid seat that is 16-18" high. Ask participant to sit on the chair with arms folded across their chest.   Instructions: "Stand up and sit down as quickly as possible 5 times, keeping your arms folded across your chest."   Measurement: Stop timing when the participant stands the 5th time.  TIME: __14.5__ (in seconds)  Times > 13.6 seconds is associated with increased disability and morbidity (Guralnik, 2000) Times > 15 seconds is predictive of recurrent falls in healthy individuals aged 75 and older (Buatois, et al., 2008) Normal performance values in community dwelling individuals aged 53 and older (Bohannon, 2006): 60-69 years: 11.4 seconds 70-79  years: 12.6 seconds 80-89 years: 14.8 seconds  MCID: >= 2.3 seconds for Vestibular Disorders (Meretta, 2006)  Pt ended session in his room (needing directional cues to locate). Left with his wife present and his needs met.    Therapy Documentation Precautions:  Precautions Precautions: Fall, Other (comment) Recall of Precautions/Restrictions: Impaired Precaution/Restrictions Comments: posey belt Restrictions Weight Bearing Restrictions Per Provider Order: No  Balance: Balance Balance Assessed: Yes Standardized Balance Assessment Standardized Balance Assessment: Berg Balance Test Berg Balance Test Sit to Stand: Able to stand without using hands and stabilize independently Standing Unsupported: Able to stand safely 2 minutes Sitting with Back Unsupported but Feet Supported on Floor or Stool: Able to sit safely and securely 2 minutes Stand to Sit: Sits safely with minimal use of hands Transfers: Able to transfer safely, minor use of hands Standing Unsupported with Eyes Closed: Able to stand 10 seconds safely Standing Ubsupported with Feet Together: Able to place feet together independently and stand for 1 minute with supervision From Standing, Reach Forward with Outstretched Arm: Can reach forward >12 cm safely (5) From Standing Position, Pick up Object from Floor: Able to pick up shoe, needs supervision From Standing Position, Turn to Look Behind Over each Shoulder: Looks behind one side only/other side shows less weight shift Turn 360 Degrees: Able to turn 360 degrees safely one side only in 4 seconds or less Standing Unsupported, Alternately Place Feet on Step/Stool: Able to complete >2 steps/needs minimal assist Standing Unsupported, One Foot in Front: Able to plae foot ahead of the other independently and hold 30 seconds Standing  on One Leg: Tries to lift leg/unable to hold 3 seconds but remains standing independently Total Score: 44    Therapy/Group: Individual  Therapy  Ronald Lowe Ronald Lowe 06/24/2024, 11:52 AM

## 2024-06-24 NOTE — Progress Notes (Signed)
 Occupational Therapy Discharge Summary  Patient Details  Name: Ronald Lowe MRN: [id number] Date of Birth: 1956-01-02  Date of Discharge from OT service:{Time; dates multiple:304500300}  {CHL IP REHAB OT TIME CALCULATIONS:304400400}   Patient has met 11 of 11 long term goals due to {due un:6958348}.  Patient to discharge at overall {LOA:3049010} level.  Patient's care partner {care partner:3041650} to provide the necessary {assistance:3041652} assistance at discharge.    Reasons goals not met: NA  Recommendation:  Patient will benefit from ongoing skilled OT services in {setting:3041680} to continue to advance functional skills in the area of {ADL/iADL:3041649}.  Equipment: BSC  Reasons for discharge: {Reason for discharge:3049018}  Patient/family agrees with progress made and goals achieved: {Pt/Family agree with progress/goals:3049020}  OT Discharge Precautions/Restrictions  Restrictions Weight Bearing Restrictions Per Provider Order: No  ADL ADL Eating: Supervision/safety Grooming: Supervision/safety Where Assessed-Grooming: Standing at sink Upper Body Bathing: Supervision/safety Where Assessed-Upper Body Bathing: Shower Lower Body Bathing: Supervision/safety Where Assessed-Lower Body Bathing: Shower Upper Body Dressing: Setup Where Assessed-Upper Body Dressing: Chair Lower Body Dressing: Supervision/safety Where Assessed-Lower Body Dressing: Chair Toileting: Supervision/safety Where Assessed-Toileting: Teacher, adult education: Distant supervision Statistician Method: Proofreader: Engineer, technical sales: Close supervison Web designer Method: Ship broker: Grab bars, Other (comment) (bSC) Walk-In Engineer, structural: Administrator, arts Method: Designer, industrial/product: Grab bars, Sales promotion account executive Baseline Vision/History: 1 Wears glasses Patient Visual Report: No  change from baseline Vision Assessment?: Yes Eye Alignment: Within Functional Limits Ocular Range of Motion: Within Functional Limits Alignment/Gaze Preference: Within Defined Limits Tracking/Visual Pursuits: Able to track stimulus in all quads without difficulty Saccades: Within functional limits Convergence: Within functional limits Additional Comments: pt requried MAX multimodal cues to follow commands related to visual assessments Perception  Perception: Impaired Perception-Other Comments: R inattention but improved from eval Praxis Praxis: Impaired Praxis Impairment Details: Motor planning Praxis-Other Comments: incoordinaton on RUE but greatly improved from eval Cognition Cognition Overall Cognitive Status: Impaired/Different from baseline Brief Interview for Mental Status (BIMS) Repetition of Three Words (First Attempt): 3 Temporal Orientation: Year: Correct Temporal Orientation: Month: Missed by 6 days to 1 month Temporal Orientation: Day: Correct Recall: Sock: Yes, no cue required Recall: Blue: Yes, no cue required Recall: Bed: Yes, after cueing (a piece of furniture) BIMS Summary Score: 13 Sensation Sensation Light Touch: Appears Intact Hot/Cold: Appears Intact Proprioception: Impaired by gross assessment Stereognosis: Not tested Coordination Gross Motor Movements are Fluid and Coordinated: No Fine Motor Movements are Fluid and Coordinated: No Motor  Motor Motor: Other (comment) Motor - Skilled Clinical Observations: Mild R sided weakness; ataxic, uncoordinated motor movements Motor - Discharge Observations: Mild R sided weakness; ataxic, uncoordinated motor movements Mobility  Bed Mobility Bed Mobility: Supine to Sit;Sit to Supine Supine to Sit: Independent Sit to Supine: Independent Transfers Sit to Stand: Independent Stand to Sit: Independent  Trunk/Postural Assessment  Cervical Assessment Cervical Assessment: Within Functional Limits Thoracic  Assessment Thoracic Assessment: Exceptions to [facility] (decreased active rotation L > R, mild kyphosis) Lumbar Assessment Lumbar Assessment: Within Functional Limits Postural Control Postural Control: Deficits on evaluation Righting Reactions: delayed  Balance Balance Balance Assessed: Yes Standardized Balance Assessment Standardized Balance Assessment: Berg Balance Test Berg Balance Test Sit to Stand: Able to stand without using hands and stabilize independently Standing Unsupported: Able to stand safely 2 minutes Sitting with Back Unsupported but Feet Supported on Floor or Stool: Able to sit safely and securely 2 minutes Stand to Sit: Sits safely with minimal  use of hands Transfers: Able to transfer safely, minor use of hands Standing Unsupported with Eyes Closed: Able to stand 10 seconds safely Standing Ubsupported with Feet Together: Able to place feet together independently and stand for 1 minute with supervision From Standing, Reach Forward with Outstretched Arm: Can reach forward >12 cm safely (5) From Standing Position, Pick up Object from Floor: Able to pick up shoe, needs supervision From Standing Position, Turn to Look Behind Over each Shoulder: Looks behind one side only/other side shows less weight shift Turn 360 Degrees: Able to turn 360 degrees safely one side only in 4 seconds or less Standing Unsupported, Alternately Place Feet on Step/Stool: Able to complete >2 steps/needs minimal assist Standing Unsupported, One Foot in Front: Able to plae foot ahead of the other independently and hold 30 seconds Standing on One Leg: Tries to lift leg/unable to hold 3 seconds but remains standing independently Total Score: 44 Extremity/Trunk Assessment RUE Assessment RUE Assessment: Exceptions to [facility] Passive Range of Motion (PROM) Comments: WFL Active Range of Motion (AROM) Comments: WFL General Strength Comments: 4/5 overall LUE Assessment LUE Assessment: Exceptions to [facility] General  Strength Comments: limited shoulder ROM- flexion/abduction ~45*; 4+/5 otherwise   Ronald Lowe 06/24/2024, 1:44 PM

## 2024-06-24 NOTE — Progress Notes (Incomplete Revision)
 Patient ID: Ronald Lowe, male   DOB: 12/26/1955, 68 y.o.   MRN: [id number]   SW met with pt and pt wife to discuss family edu to be completed today. Wife reports she is not feeling well but will make an effort to be here until 3:30pm today. Preferred outpatient location is [facility].   Graeme Jude, MSW, LCSW Office: [phone] Cell: [phone] Fax: [phone]

## 2024-06-24 NOTE — Plan of Care (Signed)
  Problem: RH Memory Goal: LTG Patient will use memory compensatory aids to (SLP) Description: LTG:  Patient will use memory compensatory aids to recall biographical/new, daily complex information with cues (SLP) Outcome: Not Met (cont to require atleast modA)   Problem: RH Awareness Goal: LTG: Patient will demonstrate awareness during functional activites type of (SLP) Description: LTG: Patient will demonstrate awareness during functional activites type of (SLP) Outcome: Not Met (cont to require modA0   Problem: RH Cognition - SLP Goal: RH LTG Patient will demonstrate orientation with cues Description:  LTG:  Patient will demonstrate orientation to person/place/time/situation with cues (SLP)   Outcome: Completed/Met   Problem: RH Comprehension Communication Goal: LTG Patient will comprehend basic/complex auditory (SLP) Description: LTG: Patient will comprehend basic/complex auditory information with cues (SLP). Outcome: Completed/Met   Problem: RH Expression Communication Goal: LTG Patient will increase speech intelligibility (SLP) Description: LTG: Patient will increase speech intelligibility at word/phrase/conversation level with cues, % of the time (SLP) Outcome: Completed/Met   Problem: RH Attention Goal: LTG Patient will demonstrate this level of attention during functional activites (SLP) Description: LTG:  Patient will will demonstrate this level of attention during functional activites (SLP) Outcome: Completed/Met   Problem: RH Swallowing Goal: LTG Patient will consume least restrictive diet using compensatory strategies with assistance (SLP) Description: LTG:  Patient will consume least restrictive diet using compensatory strategies with assistance (SLP) Outcome: Completed/Met

## 2024-06-24 NOTE — Progress Notes (Signed)
 PROGRESS NOTE   Subjective/Complaints:  Safety awareness improving, planning to go home tomorrow, discussed with nursing will trial out of enclosure bed today.  Blood work reviewed   ROS: limited by cognition/speech.   Objective:   No results found. Recent Labs    06/24/24 0521  WBC 6.4  HGB 13.6  HCT 38.2*  PLT 383     Recent Labs    06/24/24 0521  NA 135  K 3.9  CL 105  CO2 22  GLUCOSE 101*  BUN 12  CREATININE 1.09  CALCIUM  8.9          Intake/Output Summary (Last 24 hours) at 06/24/2024 0958 Last data filed at 06/24/2024 0300 Gross per 24 hour  Intake 354 ml  Output 300 ml  Net 54 ml        Physical Exam: Vital Signs Blood pressure 137/89, pulse 88, temperature 98 F (36.7 C), temperature source Oral, resp. rate 18, weight 64.8 kg, SpO2 98%.   General: No acute distress, resting in enclosure bed, comfortable Mood and affect are appropriate mostly, easily redirected during exam Heart: Regular rate and rhythm no rubs murmurs or extra sounds Lungs: Clear to auscultation, breathing unlabored, no rales or wheezes, getting nebs during eval Abdomen: Positive bowel sounds, soft nontender to palpation, nondistended Extremities: No clubbing, cyanosis, or edema Skin: No evidence of breakdown, no evidence of rash over exposed surfaces Neuro: dysarthria; fluent aphasia, follows simple commands, sitting at nursing desk not attempting to get out of wheelchair. PRIOR EXAMS:  Oriented to person and place but not time DTRs: Reflexes were 2+ in bilateral achilles, patella, biceps, BR and triceps. Able to name pen, glasses and stethoscope Sensory exam: difficult to assess due to aphasia and reduced concentration  RIght neglect +/- HH noted  Motor exam: Poor participation; grossly 5-/5 RUE and RLE; 5/5 LUE and LLE Coordination: ataxic RUE        Neurological:     Comments: Patient is alert and makes eye  contact with examiner.  Speech is dysarthric.  He was able to answer some basic questions with some delay in processing and very limited medical historian.  Assessment/Plan: 1. Functional deficits which require 3+ hours per day of interdisciplinary therapy in a comprehensive inpatient rehab setting. Physiatrist is providing close team supervision and 24 hour management of active medical problems listed below. Physiatrist and rehab team continue to assess barriers to discharge/monitor patient progress toward functional and medical goals  Care Tool:  Bathing    Body parts bathed by patient: Right arm, Left arm, Chest, Abdomen, Front perineal area, Buttocks, Right upper leg, Face, Left lower leg, Right lower leg, Left upper leg   Body parts bathed by helper: Right arm, Left arm, Face, Left lower leg, Right lower leg, Left upper leg, Right upper leg, Buttocks, Front perineal area, Abdomen, Chest     Bathing assist Assist Level: Contact Guard/Touching assist     Upper Body Dressing/Undressing Upper body dressing   What is the patient wearing?: Pull over shirt    Upper body assist Assist Level: Set up assist    Lower Body Dressing/Undressing Lower body dressing      What is  the patient wearing?: Pants, Underwear/pull up     Lower body assist Assist for lower body dressing: Moderate Assistance - Patient 50 - 74%     Toileting Toileting    Toileting assist Assist for toileting: Contact Guard/Touching assist     Transfers Chair/bed transfer  Transfers assist     Chair/bed transfer assist level: Supervision/Verbal cueing     Locomotion Ambulation   Ambulation assist      Assist level: Supervision/Verbal cueing Assistive device: Hand held assist Max distance: 200   Walk 10 feet activity   Assist     Assist level: Supervision/Verbal cueing Assistive device: No Device   Walk 50 feet activity   Assist    Assist level: Supervision/Verbal cueing Assistive  device: No Device    Walk 150 feet activity   Assist    Assist level: Supervision/Verbal cueing Assistive device: No Device    Walk 10 feet on uneven surface  activity   Assist Walk 10 feet on uneven surfaces activity did not occur: Safety/medical concerns   Assist level: Supervision/Verbal cueing Assistive device:  (no device)   Wheelchair     Assist Is the patient using a wheelchair?: Yes Type of Wheelchair: Manual    Wheelchair assist level: Total Assistance - Patient < 25% Max wheelchair distance: 150'    Wheelchair 50 feet with 2 turns activity    Assist        Assist Level: Total Assistance - Patient < 25%   Wheelchair 150 feet activity     Assist      Assist Level: Total Assistance - Patient < 25%   Blood pressure 137/89, pulse 88, temperature 98 F (36.7 C), temperature source Oral, resp. rate 18, weight 64.8 kg, SpO2 98%.  Medical Problem List and Plan: 1. Functional deficits secondary to left MCA scattered infarct likely etiology due to symptomatic proximal ICA stenosis             -patient may  shower             -ELOS/Goals: 7/8 Min A PT/OT/SLP             -Continue CIR     2.  Antithrombotics: -DVT/anticoagulation:  Pharmaceutical: Lovenox  40mg  daily  -antiplatelet therapy: Aspirin  81 mg daily and Plavix  75 mg daily until after left carotid surgery  3. Pain Management: Tylenol  as needed 4. Mood/Behavior/Sleep/alcohol withdrawal/delirium: Provide emotional support             -antipsychotic agents: seroquel  25 mg at bedtime + BID PRN              - Discontinue enclosure bed, will use TeleSitter 5. Neuropsych/cognition: This patient is not capable of making decisions on his own behalf. 6. Skin/Wound Care: Routine skin checks 7. Fluids/Electrolytes/Nutrition: Routine in and outs with follow-up chemistries -Continue vitamins and supplements Appetitie ~50% meals cont to encourage , cont BID Ensure  8.  Dysphagia.  Dysphagia #3  thin liquids.  Follow-up speech therapy 9.  COPD/tobacco use.  Continue inhalers.  NicoDerm patch.  Provide counseling 10.  PAF.  Noncompliant anticoagulation.  Cardiac rate controlled 11.  History of alcohol use.  Monitor for any signs of withdrawal.  Provide counseling Cont thiamine  100mg  po daily 12.  AKI/CKD.  Follow-up chemistries Monday 13.  GERD.  Protonix  40mg  daily 14.  Hypertension.  Patient on lisinopril  5 mg day prior to admission but noncompliant.  Monitor with increased mobility -Elevated BP increase lisinopril  to 10mg  on 7/4  -7/5-6/25  BPs a little up still, but downtrending with recent med change Vitals:   06/20/24 1605 06/20/24 2315 06/21/24 0558 06/21/24 1440  BP: (!) 173/96 (!) 148/100 (!) 177/80 (!) 154/88   06/21/24 1929 06/22/24 0413 06/22/24 1429 06/22/24 1945  BP: (!) 143/99 (!) 153/91 117/82 137/88   06/23/24 0521 06/23/24 1343 06/23/24 2035 06/24/24 0429  BP: (!) 166/114 (!) 160/97 (!) 182/101 137/89  Elevated once again will increase lisinopril , will need primary care follow-up after discharge 15.  Symptomatic left ICA stenosis.  Follow-up outpatient vascular surgery Dr. [name] 16. Bowel management: Now having regular continent stools  -06/22/24 per pt had BM today but not documented well; monitor for now  -06/23/24 LBM 7/4 per documentation; monitor.    LOS: 10 days A FACE TO FACE EVALUATION WAS PERFORMED  Ronald Lowe 06/24/2024, 9:58 AM

## 2024-06-24 NOTE — Plan of Care (Signed)
 Agitation in afternoons and night terrors; requiring enclosure bed for safety as unable to de-escalate verbally or with medications.

## 2024-06-25 ENCOUNTER — Other Ambulatory Visit (HOSPITAL_COMMUNITY): Payer: Self-pay

## 2024-06-25 NOTE — Plan of Care (Signed)

## 2024-06-25 NOTE — Progress Notes (Signed)
 PROGRESS NOTE   Subjective/Complaints:  Out of enclosure bed yesterday  Anxious to go home , awaiting wife  Discussed no driving and no return to work   ROS: limited by cognition/speech.   Objective:   No results found. Recent Labs    06/24/24 0521  WBC 6.4  HGB 13.6  HCT 38.2*  PLT 383     Recent Labs    06/24/24 0521  NA 135  K 3.9  CL 105  CO2 22  GLUCOSE 101*  BUN 12  CREATININE 1.09  CALCIUM  8.9          Intake/Output Summary (Last 24 hours) at 06/25/2024 0828 Last data filed at 06/24/2024 1700 Gross per 24 hour  Intake 340 ml  Output --  Net 340 ml        Physical Exam: Vital Signs Blood pressure 120/82, pulse 93, temperature 98 F (36.7 C), resp. rate 18, weight 64.8 kg, SpO2 96%.   General: No acute distress, resting in enclosure bed, comfortable Mood and affect are appropriate mostly, easily redirected during exam Heart: Regular rate and rhythm no rubs murmurs or extra sounds Lungs: Clear to auscultation, breathing unlabored, no rales or wheezes, getting nebs during eval Abdomen: Positive bowel sounds, soft nontender to palpation, nondistended Extremities: No clubbing, cyanosis, or edema Skin: No evidence of breakdown, no evidence of rash over exposed surfaces Neuro: dysarthria; fluent aphasia, follows simple commands, sitting at nursing desk not attempting to get out of wheelchair. PRIOR EXAMS:  Oriented to person and place but not time DTRs: Reflexes were 2+ in bilateral achilles, patella, biceps, BR and triceps. Able to name pen, glasses and stethoscope Sensory exam: difficult to assess due to aphasia and reduced concentration  RIght neglect +/- HH noted  Motor exam: Poor participation; grossly 5-/5 RUE and RLE; 5/5 LUE and LLE Coordination: ataxic RUE        Neurological:     Comments: Patient is alert and makes eye contact with examiner.  Speech is dysarthric.  He was able  to answer some basic questions with some delay in processing and very limited medical historian.  Assessment/Plan: 1. Functional deficits which require 3+ hours per day of interdisciplinary therapy in a comprehensive inpatient rehab setting. Physiatrist is providing close team supervision and 24 hour management of active medical problems listed below. Physiatrist and rehab team continue to assess barriers to discharge/monitor patient progress toward functional and medical goals  Care Tool:  Bathing    Body parts bathed by patient: Right arm, Left arm, Chest, Abdomen, Front perineal area, Buttocks, Right upper leg, Face, Left lower leg, Right lower leg, Left upper leg   Body parts bathed by helper: Right arm, Left arm, Face, Left lower leg, Right lower leg, Left upper leg, Right upper leg, Buttocks, Front perineal area, Abdomen, Chest     Bathing assist Assist Level: Supervision/Verbal cueing     Upper Body Dressing/Undressing Upper body dressing   What is the patient wearing?: Pull over shirt    Upper body assist Assist Level: Set up assist    Lower Body Dressing/Undressing Lower body dressing      What is the patient wearing?: Pants  Lower body assist Assist for lower body dressing: Supervision/Verbal cueing     Toileting Toileting    Toileting assist Assist for toileting: Supervision/Verbal cueing     Transfers Chair/bed transfer  Transfers assist     Chair/bed transfer assist level: Supervision/Verbal cueing     Locomotion Ambulation   Ambulation assist      Assist level: Supervision/Verbal cueing Assistive device: No Device Max distance: 200   Walk 10 feet activity   Assist     Assist level: Supervision/Verbal cueing Assistive device: No Device   Walk 50 feet activity   Assist    Assist level: Supervision/Verbal cueing Assistive device: No Device    Walk 150 feet activity   Assist    Assist level: Supervision/Verbal  cueing Assistive device: No Device    Walk 10 feet on uneven surface  activity   Assist Walk 10 feet on uneven surfaces activity did not occur: Safety/medical concerns   Assist level: Supervision/Verbal cueing Assistive device:  (no device)   Wheelchair     Assist Is the patient using a wheelchair?: No Type of Wheelchair: Manual    Wheelchair assist level: Total Assistance - Patient < 25% Max wheelchair distance: 150'    Wheelchair 50 feet with 2 turns activity    Assist        Assist Level: Total Assistance - Patient < 25%   Wheelchair 150 feet activity     Assist      Assist Level: Total Assistance - Patient < 25%   Blood pressure 120/82, pulse 93, temperature 98 F (36.7 C), resp. rate 18, weight 64.8 kg, SpO2 96%.  Medical Problem List and Plan: 1. Functional deficits secondary to left MCA scattered infarct likely etiology due to symptomatic proximal ICA stenosis             -patient may  shower             -ELOS/Goals: 7/8 Min A PT/OT/SLP             -Continue CIR     2.  Antithrombotics: -DVT/anticoagulation:  Pharmaceutical: Lovenox  40mg  daily  -antiplatelet therapy: Aspirin  81 mg daily and Plavix  75 mg daily until after left carotid surgery  3. Pain Management: Tylenol  as needed 4. Mood/Behavior/Sleep/alcohol withdrawal/delirium: Provide emotional support             -antipsychotic agents: seroquel  25 mg at bedtime + BID PRN              - Discontinue enclosure bed, will use TeleSitter 5. Neuropsych/cognition: This patient is not capable of making decisions on his own behalf. 6. Skin/Wound Care: Routine skin checks 7. Fluids/Electrolytes/Nutrition: Routine in and outs with follow-up chemistries -Continue vitamins and supplements Appetitie ~50% meals cont to encourage , cont BID Ensure  8.  Dysphagia.  Dysphagia #3 thin liquids.  Follow-up speech therapy 9.  COPD/tobacco use.  Continue inhalers.  NicoDerm patch.  Provide counseling 10.   PAF.  Noncompliant anticoagulation.  Cardiac rate controlled 11.  History of alcohol use.  Monitor for any signs of withdrawal.  Provide counseling Cont thiamine  100mg  po daily 12.  AKI/CKD.  Follow-up chemistries Monday 13.  GERD.  Protonix  40mg  daily 14.  Hypertension.  Patient on lisinopril  5 mg day prior to admission but noncompliant.  Monitor with increased mobility -Elevated BP increase lisinopril  to 10mg  on 7/4  -7/5-6/25 BPs a little up still, but downtrending with recent med change Vitals:   06/21/24 1929 06/22/24 0413 06/22/24 1429  06/22/24 1945  BP: (!) 143/99 (!) 153/91 117/82 137/88   06/23/24 0521 06/23/24 1343 06/23/24 2035 06/24/24 0429  BP: (!) 166/114 (!) 160/97 (!) 182/101 137/89   06/24/24 1012 06/24/24 1236 06/24/24 1955 06/25/24 0427  BP: 132/84 (!) 146/92 (!) 143/78 120/82  Elevated once again will increase lisinopril , will need primary care follow-up after discharge 15.  Symptomatic left ICA stenosis.  Follow-up outpatient vascular surgery Dr. [name] 16. Bowel management: Now having regular continent stools     LOS: 11 days A FACE TO FACE EVALUATION WAS PERFORMED  Ronald Lowe 06/25/2024, 8:28 AM

## 2024-06-25 NOTE — Progress Notes (Signed)
 Inpatient Rehabilitation Care Coordinator Discharge Note   Patient Details  Name: Ronald Lowe MRN: [id number] Date of Birth: 1956/11/21   Discharge location: D/c to home with his wife  Length of Stay: 10 days  Discharge activity level: Supervision  Home/community participation: Limited  Patient response un:Yzjouy Literacy - How often do you need to have someone help you when you read instructions, pamphlets, or other written material from your doctor or pharmacy?: Rarely  Patient response un:Dnrpjo Isolation - How often do you feel lonely or isolated from those around you?: Rarely  Services provided included: MD, PT, RD, OT, SLP, CM, RN, TR, Pharmacy, Neuropsych, SW  Financial Services:  Field seismologist Utilized: Private Insurance [employer]  Choices offered to/list presented to: Patient and wife  Follow-up services arranged:  Outpatient, DME    Outpatient Servicies: [facility] location for PT/OT/SLP DME : [employer] [facility] (shipped to home)    Patient response to transportation need: Is the patient able to respond to transportation needs?: Yes In the past 12 months, has lack of transportation kept you from medical appointments or from getting medications?: No In the past 12 months, has lack of transportation kept you from meetings, work, or from getting things needed for daily living?: No   Patient/Family verbalized understanding of follow-up arrangements:  Yes  Individual responsible for coordination of the follow-up plan: contact pt wife [name]  Confirmed correct DME delivered: Graeme DELENA Jude 06/25/2024    Comments (or additional information):fam edu completed  Summary of Stay    Date/Time Discharge Planning CSW  06/19/24 1021 TBA. Per EMR, pt will d/c to home with his wife. SW will confirm there are no barriers to discharge. AAC       Jolie Strohecker A Jude

## 2024-06-26 ENCOUNTER — Telehealth: Payer: Self-pay

## 2024-06-26 NOTE — Transitions of Care (Post Inpatient/ED Visit) (Signed)
   06/26/2024  Name: JAIEL SARACENO MRN: [id number] DOB: 1956/06/20  Today's TOC FU Call Status: Today's TOC FU Call Status:: Unsuccessful Call (1st Attempt) Unsuccessful Call (1st Attempt) Date: 06/26/24  Attempted to reach the patient regarding the most recent Inpatient/ED visit.  Follow Up Plan: Additional outreach attempts will be made to reach the patient to complete the Transitions of Care (Post Inpatient/ED visit) call.   Signature  Slater Diesel, RN

## 2024-06-27 ENCOUNTER — Telehealth: Payer: Self-pay

## 2024-06-27 NOTE — Therapy (Signed)
 OUTPATIENT OCCUPATIONAL THERAPY NEURO EVALUATION  Patient Name: Ronald Lowe MRN: [id number] DOB:[birth year], 68 y.o., male Today's Date: 07/01/2024  PCP: Raguel Blush MD REFERRING PROVIDER: Dr. Carilyn  END OF SESSION:  OT End of Session - 07/01/24 0956     Visit Number 1    Number of Visits 13    Date for OT Re-Evaluation 09/23/24    Authorization Type [employer] and [employer]    Authorization - Visit Number 1    Progress Note Due on Visit 10    OT Start Time 0850    OT Stop Time 0930    OT Time Calculation (min) 40 min    Activity Tolerance Patient tolerated treatment well    Behavior During Therapy WFL for tasks assessed/performed          Past Medical History:  Diagnosis Date   Aortic atherosclerosis (HCC)    Cervical spinal stenosis    Chronic kidney disease    COPD (chronic obstructive pulmonary disease) (HCC)    Coronary artery calcification seen on CT scan    ETOH abuse    a.) 28+ standard drinks/week   GERD (gastroesophageal reflux disease)    History of amiodarone  therapy    a.) in the past; self discontinued   History of medication noncompliance    a.) reported 10/2022 that he has been off NOAC for several months secondary to vertiginous symptoms, not acting like himself, and depression; b.) off/not taking as prescribed: antiarrhythmics, antihypertensives, statins, and MDIs   Hyperlipidemia    Hypertension    Long term current use of anticoagulant    a.) rivaroxaban    PAF (paroxysmal atrial fibrillation) (HCC)    a.) CHA2DS2VASc = 3 (age, HTN, vascular disease history);  b.) rate/rhythm maintained on oral carvediolol (self discontinued diltiazem  + amiodarone  + metoprolol ); chronically anticoagulated with rivaroxaban  (switched from apixaban  11/17/2022)   Tobacco abuse    Past Surgical History:  Procedure Laterality Date   SHOULDER ARTHROSCOPY WITH SUBACROMIAL DECOMPRESSION, ROTATOR CUFF REPAIR AND BICEP TENDON REPAIR Left 12/07/2022   Procedure: SHOULDER  ARTHROSCOPY WITH DEBRIDEMENT, DECOMPRESSION, ROTATOR CUFF REPAIR AND BICEPS TENODESIS.- RNFA;  Surgeon: Edie Norleen PARAS, MD;  Location: [facility];  Service: Orthopedics;  Laterality: Left;   SHOULDER ARTHROSCOPY WITH SUBACROMIAL DECOMPRESSION, ROTATOR CUFF REPAIR AND BICEP TENDON REPAIR Left 02/02/2023   Procedure: LEFT SHOULDER ARTHROSCOPY WITH DEBRIDEMENT AND REPAIR OF RECURRENT ROTATOR CUFF TEAR OF LEFT SHOULDER;  Surgeon: Edie Norleen PARAS, MD;  Location: [facility];  Service: Orthopedics;  Laterality: Left;   TONSILLECTOMY     Patient Active Problem List   Diagnosis Date Noted   Left middle cerebral artery stroke (HCC) 06/14/2024   Hyperlipidemia 06/07/2024   Right arm pain 06/07/2024   Acute cerebrovascular accident (CVA) (HCC) 06/07/2024   Acute CVA (cerebrovascular accident) (HCC) 06/06/2024   Status post left rotator cuff repair 02/02/2023   Foraminal stenosis of cervical region 03/23/2022   Left arm weakness 03/23/2022   Paroxysmal atrial fibrillation (HCC) 12/22/2021   Secondary hypercoagulable state (HCC) 12/22/2021   Medication management 10/20/2021   Chronic obstructive pulmonary disease (HCC) 10/19/2021   Alcohol withdrawal delirium (HCC) 06/17/2021   Atrial fibrillation with rapid ventricular response (HCC) 06/17/2021   Macrocytic anemia 06/17/2021   COPD exacerbation (HCC) 06/14/2021   Multifocal pneumonia    CAP (community acquired pneumonia) 06/13/2021   Emphysema lung (HCC) 03/17/2021   Positive hepatitis C antibody test 10/02/2019   Tobacco dependence 09/24/2019   Alcohol use disorder 09/24/2019   Acute kidney  injury (HCC) 06/12/2014   Orthostatic hypotension 06/12/2014   EKG abnormality 06/12/2014   Elevated blood pressure reading in office with diagnosis of hypertension 06/12/2014   HTN (hypertension) 04/12/2013    ONSET DATE: 06/06/24  REFERRING DIAG: P36.487 (ICD-10-CM) - Cerebral infarction due to unspecified occlusion or stenosis of left middle cerebral artery    THERAPY DIAG:  Muscle weakness (generalized) - Plan: Ot plan of care cert/re-cert  Stiffness of left shoulder, not elsewhere classified - Plan: Ot plan of care cert/re-cert  Other lack of coordination - Plan: Ot plan of care cert/re-cert  Frontal lobe and executive function deficit - Plan: Ot plan of care cert/re-cert  Attention and concentration deficit - Plan: Ot plan of care cert/re-cert  Visuospatial deficit - Plan: Ot plan of care cert/re-cert  Rationale for Evaluation and Treatment: Rehabilitation  SUBJECTIVE:   SUBJECTIVE STATEMENT: Pt reports pain in his side Pt accompanied by: significant other  PERTINENT HISTORY: Pt is a 68 y.o. male admitted 6/19. He presented to the ED with confusion, R side weakness, slurred speech, and L gaze. MRI revealed multiple acute infarcts in the L MCA and left PCA territories. PMH: daily alcohol use, HTN, a fib not on anticoagulation (secondary to cost per family, per chart review patient reported not liking how the medication made him feel), ongoing smoking, hep C D/c from CIR 06/25/24      PRECAUTIONS: Fall, will monitor  WEIGHT BEARING RESTRICTIONS: No  PAIN:  Are you having pain? Yes: NPRS scale: 3/10 Pain location: left side Pain description: aching Aggravating factors: sitting Relieving factors: movement, heat  FALLS: Has patient fallen in last 6 months? Yes. Number of falls multiple, will monitor in functional context   LIVING ENVIRONMENT: Lives with: lives with their spouse Lives in: House/apartment   PLOF: Independent  PATIENT GOALS: improve independence  OBJECTIVE:  Note: Objective measures were completed at Evaluation unless otherwise noted.  HAND DOMINANCE: Right  ADLs: Overall ADLs: increased time required Transfers/ambulation related to ADLs:  UB Dressing: supervision LB Dressing:supervision Toileting: supervision Bathing: min A/ supervision Tub Shower transfers: tub shower uses BSC, has grab bar, min  A Equipment: bed side commode  IADLs:Dependent for home management prior to CVA  Community mobility: supervision Medication management: neighbor is Teacher, English as a foreign language: Engineer, technical sales, wife to helps  Handwriting: 50% legible  MOBILITY STATUS: supervision    ACTIVITY TOLERANCE: Activity tolerance: decreased activity tolerance since CVA   UPPER EXTREMITY ROM:  Pt with history of shoulder surgery x 2 that did not go well and he has had limited use since then  Active ROM Right eval Left eval  Shoulder flexion 120 45  Shoulder abduction  45  Shoulder adduction    Shoulder extension    Shoulder internal rotation    Shoulder external rotation    Elbow flexion    Elbow extension -25   Wrist flexion    Wrist extension    Wrist ulnar deviation    Wrist radial deviation    Wrist pronation    Wrist supination    (Blank rows = not tested)  UPPER EXTREMITY MMT:     MMT Right eval Left eval  Shoulder flexion 4/5 3-/5  Shoulder abduction    Shoulder adduction    Shoulder extension    Shoulder internal rotation    Shoulder external rotation    Middle trapezius    Lower trapezius    Elbow flexion 3+/5 4/5  Elbow extension 3+/5 4-/5  Wrist flexion  Wrist extension    Wrist ulnar deviation    Wrist radial deviation    Wrist pronation    Wrist supination    (Blank rows = not tested)  HAND FUNCTION: Grip strength: Right: 65 lbs; Left: 65 lbs  COORDINATION: 9 Hole Peg test: Right: 69 secs sec; Left: 40.73 sec  SENSATION: WFL    COGNITION: Overall cognitive status: Impaired, short term memory deficits, not oriented to day, date or month, orineted to year and why he is attending therapy Dificutly following simple commands, decreased sustained attention in a quiet environment VISION: Subjective report: Pt reports blurry vision at times  Baseline vision: Wears glasses all the time   VISION ASSESSMENT: R inattention Tracking/Visual pursuits: Decreased  smoothness of eye movement to Right superior field and Decreased smoothness of eye movement to Right inferior field  Number cancellation task: 19/37 missed 51%, pt only completed almost 1/2 page of items with increased time due to decreased attention. Items were missed on both sides but greater on right     OBSERVATIONS: Pt accompanied by his wife demonstrates poor awareness and cognitive deficits                                                                                                                             TREATMENT DATE: 07/01/24- eval          PATIENT EDUCATION: Education details: role of OT, potential goals Person educated: Patient and Spouse Education method: Explanation Education comprehension: verbalized understanding  HOME EXERCISE PROGRAM: n/a   GOALS: Goals reviewed with patient? Yes  SHORT TERM GOALS: Target date: 07/31/24  I with HEP.  Goal status: INITIAL  2.  Pt will perform all basic ADLS mod I  Goal status: INITIAL  3.  Pt will sustain attention to a functional or visual perceptual task x 15 mins with no more than min re-directs.  Goal status: INITIAL  4.  Pt will perform tabletop scanning with 75% or better accuracy.  Goal status: INITIAL  5. Pt / wife will verbalize understanding of compensatory strategies for vision and cognition. Goal status: INITIAL  6. Pt will demonstrate improved RUE fine motor coordination as evidenced by decreasing 9 hole peg test score to 60 secs or less  Goal status: inital  LONG TERM GOALS: Target date: 09/23/24  I with updated HEP  Goal status: INITIAL  2.  Pt will perform environmental scanning tasks in a busy environment with 85% or better accuracy.  Goal status: INITIAL  3.  Pt will perform table top scanning activities with 85% or better accuracy  Goal status: INITIAL  4.  Pt will demonstrate improved RUE fine motor coordination as eveidnced by decreasing 9 hole peg test score to 50 secs  or less  Goal status: inital   5.  Pt will demonstrate improved RUE functional use as evidenced by retrieving a 3 lbs weight from shelf at 125* without drops.  Goal status: INITIAL  ASSESSMENT:  CLINICAL IMPRESSION:  Pt is a 68 y.o. male hopitalized 6/19. He presented to the ED with confusion, R side weakness, slurred speech, and L gaze. MRI revealed multiple acute infarcts in the L MCA and left PCA territories. PMH: daily alcohol use, HTN, a fib not on anticoagulation (secondary to cost per family, per chart review patient reported not liking how the medication made him feel), ongoing smoking, hep C,  hx of shoulder surgery x 2 to LUE.D/C from CIR 06/25/24 Pt presents with the performance deficits below. Pt can benefit from skilled occupational therapy to address these deficits in order to maximize pt's safety and I with ADLs/ IADLs.    PERFORMANCE DEFICITS: in functional skills including ADLs, IADLs, coordination, dexterity, ROM, strength, Fine motor control, Gross motor control, mobility, balance, endurance, decreased knowledge of precautions, decreased knowledge of use of DME, vision, and UE functional use, cognitive skills including attention, memory, orientation, problem solving, safety awareness, sequencing, thought, and understand, and psychosocial skills including coping strategies, environmental adaptation, habits, interpersonal interactions, and routines and behaviors.   IMPAIRMENTS: are limiting patient from ADLs, IADLs, rest and sleep, education, play, leisure, and social participation.   CO-MORBIDITIES: may have co-morbidities  that affects occupational performance. Patient will benefit from skilled OT to address above impairments and improve overall function.  MODIFICATION OR ASSISTANCE TO COMPLETE EVALUATION: Min-Moderate modification of tasks or assist with assess necessary to complete an evaluation.  OT OCCUPATIONAL PROFILE AND HISTORY: Detailed assessment: Review of records  and additional review of physical, cognitive, psychosocial history related to current functional performance.  CLINICAL DECISION MAKING: LOW - limited treatment options, no task modification necessary  REHAB POTENTIAL: Good  EVALUATION COMPLEXITY: Low    PLAN:  OT FREQUENCY: 1x/week plus eval  OT DURATION: 12 weeks  PLANNED INTERVENTIONS: 97168 OT Re-evaluation, 97535 self care/ADL training, 02889 therapeutic exercise, 97530 therapeutic activity, 97112 neuromuscular re-education, 97140 manual therapy, 97116 gait training, 02964 ultrasound, 97018 paraffin, 02989 moist heat, 97010 cryotherapy, 97129 Cognitive training (first 15 min), 02869 Cognitive training(each additional 15 min), passive range of motion, balance training, functional mobility training, visual/perceptual remediation/compensation, psychosocial skills training, energy conservation, coping strategies training, patient/family education, and DME and/or AE instructions  RECOMMENDED OTHER SERVICES: ST  CONSULTED AND AGREED WITH PLAN OF CARE: Patient and family member/caregiver  PLAN FOR NEXT SESSION: inital HEP for RUE coordination, visual scanning strategies, visual perceptual task with cognitve component, peg design or pipe tree.   Markeith Jue, OT 07/01/2024, 12:37 PM

## 2024-06-27 NOTE — Transitions of Care (Post Inpatient/ED Visit) (Signed)
   06/27/2024  Name: Ronald Lowe MRN: [id number] DOB: Dec 20, 1955  Today's TOC FU Call Status: Today's TOC FU Call Status:: Unsuccessful Call (2nd Attempt) Unsuccessful Call (1st Attempt) Date: 06/26/24 Unsuccessful Call (2nd Attempt) Date: 06/27/24  Attempted to reach the patient regarding the most recent Inpatient/ED visit.  Follow Up Plan: Additional outreach attempts will be made to reach the patient to complete the Transitions of Care (Post Inpatient/ED visit) call.   Signature  Slater Diesel, RN

## 2024-07-01 ENCOUNTER — Encounter: Payer: Self-pay | Admitting: Speech Pathology

## 2024-07-01 ENCOUNTER — Ambulatory Visit: Admitting: Occupational Therapy

## 2024-07-01 ENCOUNTER — Other Ambulatory Visit: Payer: Self-pay

## 2024-07-01 ENCOUNTER — Encounter: Payer: Self-pay | Admitting: Occupational Therapy

## 2024-07-01 ENCOUNTER — Encounter: Payer: Self-pay | Admitting: Physical Therapy

## 2024-07-01 ENCOUNTER — Ambulatory Visit: Attending: Physician Assistant | Admitting: Physical Therapy

## 2024-07-01 ENCOUNTER — Telehealth: Payer: Self-pay

## 2024-07-01 ENCOUNTER — Ambulatory Visit: Admitting: Speech Pathology

## 2024-07-01 DIAGNOSIS — R471 Dysarthria and anarthria: Secondary | ICD-10-CM | POA: Diagnosis not present

## 2024-07-01 DIAGNOSIS — M25612 Stiffness of left shoulder, not elsewhere classified: Secondary | ICD-10-CM

## 2024-07-01 DIAGNOSIS — R41844 Frontal lobe and executive function deficit: Secondary | ICD-10-CM

## 2024-07-01 DIAGNOSIS — M6281 Muscle weakness (generalized): Secondary | ICD-10-CM | POA: Insufficient documentation

## 2024-07-01 DIAGNOSIS — R41840 Attention and concentration deficit: Secondary | ICD-10-CM | POA: Diagnosis not present

## 2024-07-01 DIAGNOSIS — R278 Other lack of coordination: Secondary | ICD-10-CM | POA: Insufficient documentation

## 2024-07-01 DIAGNOSIS — R41842 Visuospatial deficit: Secondary | ICD-10-CM

## 2024-07-01 DIAGNOSIS — I639 Cerebral infarction, unspecified: Secondary | ICD-10-CM | POA: Diagnosis not present

## 2024-07-01 DIAGNOSIS — R41841 Cognitive communication deficit: Secondary | ICD-10-CM | POA: Diagnosis not present

## 2024-07-01 NOTE — Progress Notes (Signed)
   07/01/24 0001  Standardized Balance Assessment  Standardized Balance Assessment Dynamic Gait Index  Dynamic Gait Index  Level Surface 3  Change in Gait Speed 2  Gait with Horizontal Head Turns 3  Gait with Vertical Head Turns 3  Gait and Pivot Turn 3  Step Over Obstacle 3  Step Around Obstacles 3  Steps 3  Total Score 23

## 2024-07-01 NOTE — Therapy (Signed)
 OUTPATIENT PHYSICAL THERAPY EVALUATION   Patient Name: Ronald Lowe MRN: [id number] DOB:03-Feb-1956, 68 y.o., male Today's Date: 07/01/2024  END OF SESSION:  PT End of Session - 07/01/24 1009     Visit Number 1    Number of Visits 1    Date for PT Re-Evaluation 07/01/24    Authorization Type [employer]    Authorization Time Period 07/01/24 to 07/01/24    PT Start Time 0930    PT Stop Time 0956   skilled eval complete- will benefit the most from focus on OT/speech to avoid overlapping interventions   PT Time Calculation (min) 26 min    Activity Tolerance Patient tolerated treatment well    Behavior During Therapy [facility] for tasks assessed/performed          Past Medical History:  Diagnosis Date   Aortic atherosclerosis (HCC)    Cervical spinal stenosis    Chronic kidney disease    COPD (chronic obstructive pulmonary disease) (HCC)    Coronary artery calcification seen on CT scan    ETOH abuse    a.) 28+ standard drinks/week   GERD (gastroesophageal reflux disease)    History of amiodarone  therapy    a.) in the past; self discontinued   History of medication noncompliance    a.) reported 10/2022 that he has been off NOAC for several months secondary to vertiginous symptoms, not acting like himself, and depression; b.) off/not taking as prescribed: antiarrhythmics, antihypertensives, statins, and MDIs   Hyperlipidemia    Hypertension    Long term current use of anticoagulant    a.) rivaroxaban    PAF (paroxysmal atrial fibrillation) (HCC)    a.) CHA2DS2VASc = 3 (age, HTN, vascular disease history);  b.) rate/rhythm maintained on oral carvediolol (self discontinued diltiazem  + amiodarone  + metoprolol ); chronically anticoagulated with rivaroxaban  (switched from apixaban  11/17/2022)   Tobacco abuse    Past Surgical History:  Procedure Laterality Date   SHOULDER ARTHROSCOPY WITH SUBACROMIAL DECOMPRESSION, ROTATOR CUFF REPAIR AND BICEP TENDON REPAIR Left 12/07/2022   Procedure:  SHOULDER ARTHROSCOPY WITH DEBRIDEMENT, DECOMPRESSION, ROTATOR CUFF REPAIR AND BICEPS TENODESIS.- RNFA;  Surgeon: Edie Norleen PARAS, MD;  Location: [facility];  Service: Orthopedics;  Laterality: Left;   SHOULDER ARTHROSCOPY WITH SUBACROMIAL DECOMPRESSION, ROTATOR CUFF REPAIR AND BICEP TENDON REPAIR Left 02/02/2023   Procedure: LEFT SHOULDER ARTHROSCOPY WITH DEBRIDEMENT AND REPAIR OF RECURRENT ROTATOR CUFF TEAR OF LEFT SHOULDER;  Surgeon: Edie Norleen PARAS, MD;  Location: [facility];  Service: Orthopedics;  Laterality: Left;   TONSILLECTOMY     Patient Active Problem List   Diagnosis Date Noted   Left middle cerebral artery stroke (HCC) 06/14/2024   Hyperlipidemia 06/07/2024   Right arm pain 06/07/2024   Acute cerebrovascular accident (CVA) (HCC) 06/07/2024   Acute CVA (cerebrovascular accident) (HCC) 06/06/2024   Status post left rotator cuff repair 02/02/2023   Foraminal stenosis of cervical region 03/23/2022   Left arm weakness 03/23/2022   Paroxysmal atrial fibrillation (HCC) 12/22/2021   Secondary hypercoagulable state (HCC) 12/22/2021   Medication management 10/20/2021   Chronic obstructive pulmonary disease (HCC) 10/19/2021   Alcohol withdrawal delirium (HCC) 06/17/2021   Atrial fibrillation with rapid ventricular response (HCC) 06/17/2021   Macrocytic anemia 06/17/2021   COPD exacerbation (HCC) 06/14/2021   Multifocal pneumonia    CAP (community acquired pneumonia) 06/13/2021   Emphysema lung (HCC) 03/17/2021   Positive hepatitis C antibody test 10/02/2019   Tobacco dependence 09/24/2019   Alcohol use disorder 09/24/2019   Acute kidney injury (HCC) 06/12/2014  Orthostatic hypotension 06/12/2014   EKG abnormality 06/12/2014   Elevated blood pressure reading in office with diagnosis of hypertension 06/12/2014   HTN (hypertension) 04/12/2013    PCP: Tanda Bleacher MD   REFERRING PROVIDER: Pegge Toribio PARAS, PA-C  REFERRING DIAG: Diagnosis [phone] (ICD-10-CM) - Cerebral infarction due  to unspecified occlusion or stenosis of left middle cerebral artery  THERAPY DIAG:  Acute cerebrovascular accident (CVA) [facility])  Rationale for Evaluation and Treatment: Rehabilitation  ONSET DATE: CVA found on MRI 06/06/24  SUBJECTIVE:   SUBJECTIVE STATEMENT:  Had my stroke. According to my doctors, everything is hard for me. I'm a prankster.   Per SO: he is doing well, sometimes he leans to the right. Parts of his brain cannot focus on what he's supposed to being either. Has a hard time focusing with R eye after a certain point.   PERTINENT HISTORY: See above  PAIN:  Are you having pain? No  PRECAUTIONS: Fall and Other: possible R neglect  RED FLAGS: None   WEIGHT BEARING RESTRICTIONS: No  FALLS:  Has patient fallen in last 6 months? Yes. Number of falls several due to having mini strokes per SO, per pt liquor falls   LIVING ENVIRONMENT: Lives with: lives with their family Lives in: Mobile home Stairs: ramp to enter, no stairs inside  Has following equipment at home: Single point cane, Walker - 2 wheeled, shower chair, bed side commode, Grab bars, and Ramped entry  OCCUPATION: retired  PLOF: Independent, Independent with basic ADLs, Independent with gait, and Independent with transfers  PATIENT GOALS: get better   NEXT MD VISIT: [place] 07/04/24  OBJECTIVE:  Note: Objective measures were completed at Evaluation unless otherwise noted.  DIAGNOSTIC FINDINGS:  Narrative & Impression  CLINICAL DATA:  Neuro deficit, acute, stroke suspected   EXAM: MRI HEAD WITHOUT CONTRAST   TECHNIQUE: Multiplanar, multiecho pulse sequences of the brain and surrounding structures were obtained without intravenous contrast.   COMPARISON:  Same day CTA head/neck and CT head.   FINDINGS: Brain: Multiple acute infarcts in the left MCA and left PCA territories, including the left basal ganglia, left posterior limb of the internal capsule, left anterior insula, overlying  left frontal and parietal lobes and the left occipital lobe. Mild associated edema in the anterior insula. No mass effect. No midline shift. Additional moderate patchy T2/FLAIR hyperintensities, compatible with chronic microvascular ischemic disease. No mass lesion or hydrocephalus. No extra-axial fluid collections.   Vascular: Better evaluated on same day CTA.   Skull and upper cervical spine: Normal marrow signal.   Sinuses/Orbits: Clear sinuses.  No acute orbital findings.   Other: No mastoid effusions.   IMPRESSION: Multiple acute infarcts in the left MCA and left PCA territories, including the left basal ganglia, left posterior limb of the internal capsule, left anterior insula, overlying left frontal and parietal lobes and the left occipital lobe. No mass effect.     Electronically Signed   By: Gilmore GORMAN Molt M.D.   On: 06/06/2024 23:29    PATIENT SURVEYS:   ABC   COGNITION: Overall cognitive status: hx of impulsivity in CIR, poor safety awareness noted         LOWER EXTREMITY MMT:  MMT Right eval Left eval  Hip flexion 4 4+  Hip extension    Hip abduction    Hip adduction    Hip internal rotation    Hip external rotation    Knee flexion 4+ 4+  Knee extension 4+ 4+  Ankle dorsiflexion 5  5  Ankle plantarflexion    Ankle inversion    Ankle eversion     (Blank rows = not tested)    FUNCTIONAL TESTS:  5 times sit to stand: 14.5 seconds no UEs  3 minute walk test: 435ft steady with gait but needed Max cues for path finding and navigation  Dynamic Gait Index:     07/01/24 0001  Standardized Balance Assessment  Standardized Balance Assessment Dynamic Gait Index  Dynamic Gait Index  Level Surface 3  Change in Gait Speed 2  Gait with Horizontal Head Turns 3  Gait with Vertical Head Turns 3  Gait and Pivot Turn 3  Step Over Obstacle 3  Step Around Obstacles 3  Steps 3  Total Score 23      GAIT: Distance walked: 436ft Assistive device  utilized: None Level of assistance: Complete Independence Comments: gait pattern slow but likely heavily affected                                                                                                                                 TREATMENT DATE:   07/01/24  Eval only     PATIENT EDUCATION:  Education details: exam findings, no skilled PT needs at this time- strength, balance WNL; did need a lot of help with pathfinding/navigation but scoring well on all objective measures. Findings/concerns with cognition/sequencing/R neglect best addressed between OT/Speech so that we are not overlapping services  Person educated: Patient and Spouse Education method: Explanation and Demonstration Education comprehension: verbalized understanding, returned demonstration, and needs further education  HOME EXERCISE PROGRAM: N/A   ASSESSMENT:  CLINICAL IMPRESSION: Patient is a 68 y.o. M who was seen today for physical therapy evaluation and treatment for Diagnosis I63.512 (ICD-10-CM) - Cerebral infarction due to unspecified occlusion or stenosis of left middle cerebral artery. He did very well with targeted PT objective measures, with main limitations related to cognition and pathfinding/navigation as well as some R sided neglect. Will benefit the most from focus on skilled OT and speech services, holding off on PT for now to avoid overlapping skilled interventions.   OBJECTIVE IMPAIRMENTS: decreased coordination and decreased safety awareness.   ACTIVITY LIMITATIONS: locomotion level  PARTICIPATION LIMITATIONS: driving, shopping, community activity, and yard work  PERSONAL FACTORS: Behavior pattern, Education, Fitness, Past/current experiences, Social background, and Time since onset of injury/illness/exacerbation are also affecting patient's functional outcome.   REHAB POTENTIAL: Fair hx of heavy drinking, poor medical literacy  CLINICAL DECISION MAKING:  Stable/uncomplicated  EVALUATION COMPLEXITY: Low   GOALS: Goals reviewed with patient? No  SHORT TERM GOALS: Target date: 07/01/24  PT eval and all appropriate education to have been completed  Baseline: Goal status: MET 07/01/24     LONG TERM GOALS: Target date: N/A      PLAN:  PT FREQUENCY: eval only   PT DURATION: other: eval only   PLANNED INTERVENTIONS:Eval only  PLAN FOR NEXT SESSION: eval only, focus on  speech and OT moving forward   Josette Rough, PT, DPT 07/01/24 10:13 AM

## 2024-07-01 NOTE — Therapy (Signed)
 OUTPATIENT SPEECH LANGUAGE PATHOLOGY EVALUATION   Patient Name: Ronald Lowe MRN: [id number] DOB:February 28, 1956, 68 y.o., male Today's Date: 07/01/2024  PCP: Tanda Bleacher MD REFERRING PROVIDER: Pegge Toribio PARAS, PA-C  END OF SESSION:  End of Session - 07/01/24 1021     Visit Number 1    Date for SLP Re-Evaluation 09/01/24    SLP Start Time 1015    SLP Stop Time  1100    SLP Time Calculation (min) 45 min    Activity Tolerance Patient tolerated treatment well          Past Medical History:  Diagnosis Date   Aortic atherosclerosis (HCC)    Cervical spinal stenosis    Chronic kidney disease    COPD (chronic obstructive pulmonary disease) (HCC)    Coronary artery calcification seen on CT scan    ETOH abuse    a.) 28+ standard drinks/week   GERD (gastroesophageal reflux disease)    History of amiodarone  therapy    a.) in the past; self discontinued   History of medication noncompliance    a.) reported 10/2022 that he has been off NOAC for several months secondary to vertiginous symptoms, not acting like himself, and depression; b.) off/not taking as prescribed: antiarrhythmics, antihypertensives, statins, and MDIs   Hyperlipidemia    Hypertension    Long term current use of anticoagulant    a.) rivaroxaban    PAF (paroxysmal atrial fibrillation) (HCC)    a.) CHA2DS2VASc = 3 (age, HTN, vascular disease history);  b.) rate/rhythm maintained on oral carvediolol (self discontinued diltiazem  + amiodarone  + metoprolol ); chronically anticoagulated with rivaroxaban  (switched from apixaban  11/17/2022)   Tobacco abuse    Past Surgical History:  Procedure Laterality Date   SHOULDER ARTHROSCOPY WITH SUBACROMIAL DECOMPRESSION, ROTATOR CUFF REPAIR AND BICEP TENDON REPAIR Left 12/07/2022   Procedure: SHOULDER ARTHROSCOPY WITH DEBRIDEMENT, DECOMPRESSION, ROTATOR CUFF REPAIR AND BICEPS TENODESIS.- RNFA;  Surgeon: Edie Norleen PARAS, MD;  Location: [facility];  Service: Orthopedics;  Laterality:  Left;   SHOULDER ARTHROSCOPY WITH SUBACROMIAL DECOMPRESSION, ROTATOR CUFF REPAIR AND BICEP TENDON REPAIR Left 02/02/2023   Procedure: LEFT SHOULDER ARTHROSCOPY WITH DEBRIDEMENT AND REPAIR OF RECURRENT ROTATOR CUFF TEAR OF LEFT SHOULDER;  Surgeon: Edie Norleen PARAS, MD;  Location: [facility];  Service: Orthopedics;  Laterality: Left;   TONSILLECTOMY     Patient Active Problem List   Diagnosis Date Noted   Left middle cerebral artery stroke (HCC) 06/14/2024   Hyperlipidemia 06/07/2024   Right arm pain 06/07/2024   Acute cerebrovascular accident (CVA) (HCC) 06/07/2024   Acute CVA (cerebrovascular accident) (HCC) 06/06/2024   Status post left rotator cuff repair 02/02/2023   Foraminal stenosis of cervical region 03/23/2022   Left arm weakness 03/23/2022   Paroxysmal atrial fibrillation (HCC) 12/22/2021   Secondary hypercoagulable state (HCC) 12/22/2021   Medication management 10/20/2021   Chronic obstructive pulmonary disease (HCC) 10/19/2021   Alcohol withdrawal delirium (HCC) 06/17/2021   Atrial fibrillation with rapid ventricular response (HCC) 06/17/2021   Macrocytic anemia 06/17/2021   COPD exacerbation (HCC) 06/14/2021   Multifocal pneumonia    CAP (community acquired pneumonia) 06/13/2021   Emphysema lung (HCC) 03/17/2021   Positive hepatitis C antibody test 10/02/2019   Tobacco dependence 09/24/2019   Alcohol use disorder 09/24/2019   Acute kidney injury (HCC) 06/12/2014   Orthostatic hypotension 06/12/2014   EKG abnormality 06/12/2014   Elevated blood pressure reading in office with diagnosis of hypertension 06/12/2014   HTN (hypertension) 04/12/2013    ONSET DATE:  06/06/24  REFERRING DIAG: P36.487 (ICD-10-CM) - Cerebral infarction due to unspecified occlusion or stenosis of left middle cerebral artery   THERAPY DIAG:  Cognitive communication deficit  Rationale for Evaluation and Treatment: Rehabilitation  SUBJECTIVE:   SUBJECTIVE STATEMENT: Pt was pleasant and  cooperative.   Pt accompanied by: self and significant other; [name]  PERTINENT HISTORY: Past medical history significant for HTN, HLD, paroxysmal atrial fibrillation noncompliant with anticoagulation, GERD, CKD, COPD, EtOH/tobacco use disorder, GERD   PAIN:  Are you having pain? No pain; but is constipated   FALLS: Has patient fallen in last 6 months?  Yes; 3 falls (prior to hospital stay)  LIVING ENVIRONMENT: Lives with: lives with their spouse Lives in: Mobile home  PLOF:  Level of assistance: Independent with ADLs, Independent with IADLs Employment: Retired; English as a second language teacher   PATIENT GOALS: focus   OBJECTIVE:  Note: Objective measures were completed at Evaluation unless otherwise noted.  DIAGNOSTIC FINDINGS:   MR BRAIN WO CONTRAST 06/06/2024  IMPRESSION: Multiple acute infarcts in the left MCA and left PCA territories, including the left basal ganglia, left posterior limb of the internal capsule, left anterior insula, overlying left frontal and parietal lobes and the left occipital lobe. No mass effect.     Electronically Signed   By: Gilmore GORMAN Molt M.D.   On: 06/06/2024 23:29  COGNITION: Overall cognitive status: Impaired Areas of impairment:  Attention: Impaired: Sustained, Selective, Alternating, Divided Memory: Impaired: Working Development worker, community Awareness: Impaired: Emergent Executive function: Impaired: Impulse control, Problem solving, Organization, Planning, Error awareness, Self-correction, and Slow processing Behavior: Restless, Impulsive, and reportedly becomes easily agitated at home Functional deficits: impacting ability  to do iADLs  COGNITIVE COMMUNICATION: Following directions: Follows multi-step commands inconsistently and with increased time Auditory comprehension: Impaired: requires repetition Verbal expression: WFL Functional communication: Impaired: Not recalling details in conversations or experiences  ORAL MOTOR  EXAMINATION: Overall status: Did not assess Comments: NA  STANDARDIZED ASSESSMENTS: Initiated CLQT Oriented to year- not oriented to month, date, or DOW Oriented to DOB, not to age     Cognitive Linguistic Quick Test: AGE - 18 - 69   The Cognitive Linguistic Quick Test (CLQT) was administered to assess the relative status of five cognitive domains: attention, memory, language, executive functioning, and visuospatial skills. Scores from 10 tasks were used to estimate severity ratings (standardized for age groups 18-69 years and 70-89 years) for each domain, a clock drawing task, as well as an overall composite severity rating of cognition.       Task Score Criterion Cut Scores  Personal Facts 7/8 8  Symbol Cancellation 0/12 11  Confrontation Naming -/10 10  Clock Drawing  0/13 12  Story Retelling 2/10 6  Symbol Trails -/10 9  Generative Naming 3/9 5  Design Memory -/6 5  Mazes  -/8 7  Design Generation -/13 6      PATIENT REPORTED OUTCOME MEASURES (PROM): To complete in subsequent sessions  TREATMENT DATE:     PATIENT EDUCATION: Education details: SLP role; Cognitive-Communication Person educated: Patient and Spouse Education method: Explanation Education comprehension: verbalized understanding and needs further education   GOALS: Goals reviewed with patient? No  SHORT TERM GOALS: Target date: 08/01/24  Pt will complete CLQT and PROMs Baseline:  Goal status: INITIAL  2. Pt will orient to date using external aid independently Baseline: Goal status: INITIAL  3.  Pt will sustain attention to task for 5 minutes with min verbal cues Baseline:  Goal status: INITIAL  4 .  Pt/wife will recall 3 strategies to support deficits at home  Baseline:  Goal status: INITIAL    LONG TERM GOALS: Target date: 09/01/24  Pt will be oriented x4 with  minA verbal cues  Baseline:  Goal status: INITIAL  2.  Pt/wife will report improvement in attention during daily activities  Baseline:  Goal status: INITIAL  3.  Pt/wife will report successful use of memory aids at home to support recall of information  Baseline:  Goal status: INITIAL    ASSESSMENT:  CLINICAL IMPRESSION: Pt is a 68 yo male who presents to [facility] for evaluation post CVA. Pt was accompanied by wife today who assisted in providing history. Pt and wife endorse changes in cognition, pt rating himself 80% back to baseline and wife rating him 60% back to baseline. Wife reports she just wants him to be able to do basic stuff like go to the grocery and get something. Wife reports his attention is the most concerning. Pt was not fully oriented at today's assessment. SLP initiated CLQT which showed impairments in cognition. To complete next session. SLP observed poor attention, answering different questions than what were asked, and impaired orientation. Speech intelligibility was notably reduced ~90%; however, wife and pt report this is not a problem to them currently. Will continue to monitor and address as needed. Pt noted to joke or not answer questions straight-forward, but once question was repeated, he would answer correctly. Wife is currently stressed with caregiving workload. SLP offered resources, but pt shared she is planning to look for someone to come inside the home to help. SLP rec skilled ST services to address cognitive-communication impairment to increase independence with basic tasks and decrease caregiver burden.    OBJECTIVE IMPAIRMENTS: include attention, memory, awareness, and executive functioning. These impairments are limiting patient from managing medications, managing appointments, managing finances, household responsibilities, and effectively communicating at home and in community. Factors affecting potential to achieve goals and functional outcome are ability  to learn/carryover information.. Patient will benefit from skilled SLP services to address above impairments and improve overall function.  REHAB POTENTIAL: Good  PLAN:  SLP FREQUENCY: 1x/week  SLP DURATION: 8 weeks  PLANNED INTERVENTIONS: Environmental controls, Cueing hierachy, Cognitive reorganization, Internal/external aids, Functional tasks, SLP instruction and feedback, Compensatory strategies, Patient/family education, and [id number] Treatment of speech (30 or 45 min)     [employer], CCC-SLP 07/01/2024, 10:22 AM

## 2024-07-01 NOTE — Transitions of Care (Post Inpatient/ED Visit) (Signed)
   07/01/2024  Name: Ronald Lowe MRN: [id number] DOB: 07-09-1956  Today's TOC FU Call Status: Today's TOC FU Call Status:: Unsuccessful Call (3rd Attempt) Unsuccessful Call (1st Attempt) Date: 06/26/24 Unsuccessful Call (2nd Attempt) Date: 06/27/24 Unsuccessful Call (3rd Attempt) Date: 07/01/24  Attempted to reach the patient regarding the most recent Inpatient/ED visit.  Follow Up Plan: No further outreach attempts will be made at this time. We have been unable to contact the patient.  I also called [phone] for [name] and the voicemail was full.   Letter sent to patient requesting he contact [facility] to schedule a follow up appointment as we have not been able to reach him.    Signature Slater Diesel, RN

## 2024-07-04 ENCOUNTER — Encounter: Admitting: Physical Medicine & Rehabilitation

## 2024-07-04 ENCOUNTER — Telehealth: Payer: Self-pay

## 2024-07-04 NOTE — Telephone Encounter (Signed)
 Error

## 2024-07-04 NOTE — Telephone Encounter (Signed)
 Copied from CRM [id number]. Topic: Clinical - Medical Advice >> Jul 03, 2024  2:41 PM Sophia H wrote: Reason for CRM: Patients wife is calling in requesting to speak to nurse regarding the patient. States he was in the hospital for a series of mini strokes and since he has been home he has only used the restroom once. States she has given him stool softeners/OTC meds to help but not working. Wants to know if there is anything provider can send in on his behalf  Please contact wife [phone] [facility]    [id number] - [place], [place] [address] - [address]

## 2024-07-05 ENCOUNTER — Other Ambulatory Visit: Payer: Self-pay | Admitting: Family Medicine

## 2024-07-05 MED ORDER — POLYETHYLENE GLYCOL 3350 17 GM/SCOOP PO POWD: 17.0000 g | Freq: Two times a day (BID) | ORAL | 1 refills | Status: DC | PRN

## 2024-07-05 NOTE — Telephone Encounter (Signed)
Called pt   No answer   LVM to call back.

## 2024-07-08 ENCOUNTER — Other Ambulatory Visit: Payer: Self-pay | Admitting: *Deleted

## 2024-07-08 MED ORDER — LISINOPRIL 20 MG PO TABS: 20.0000 mg | ORAL_TABLET | Freq: Every day | ORAL | 0 refills | Status: DC

## 2024-07-10 NOTE — Therapy (Incomplete)
 OUTPATIENT OCCUPATIONAL THERAPY NEURO TREATMENT  Patient Name: Ronald Lowe MRN: [id number] DOB:1956/11/28, 68 y.o., male Today's Date: 07/10/2024  PCP: Raguel Blush MD REFERRING PROVIDER: Dr. Carilyn  END OF SESSION:    Past Medical History:  Diagnosis Date   Aortic atherosclerosis (HCC)    Cervical spinal stenosis    Chronic kidney disease    COPD (chronic obstructive pulmonary disease) (HCC)    Coronary artery calcification seen on CT scan    ETOH abuse    a.) 28+ standard drinks/week   GERD (gastroesophageal reflux disease)    History of amiodarone  therapy    a.) in the past; self discontinued   History of medication noncompliance    a.) reported 10/2022 that he has been off NOAC for several months secondary to vertiginous symptoms, not acting like himself, and depression; b.) off/not taking as prescribed: antiarrhythmics, antihypertensives, statins, and MDIs   Hyperlipidemia    Hypertension    Long term current use of anticoagulant    a.) rivaroxaban    PAF (paroxysmal atrial fibrillation) (HCC)    a.) CHA2DS2VASc = 3 (age, HTN, vascular disease history);  b.) rate/rhythm maintained on oral carvediolol (self discontinued diltiazem  + amiodarone  + metoprolol ); chronically anticoagulated with rivaroxaban  (switched from apixaban  11/17/2022)   Tobacco abuse    Past Surgical History:  Procedure Laterality Date   SHOULDER ARTHROSCOPY WITH SUBACROMIAL DECOMPRESSION, ROTATOR CUFF REPAIR AND BICEP TENDON REPAIR Left 12/07/2022   Procedure: SHOULDER ARTHROSCOPY WITH DEBRIDEMENT, DECOMPRESSION, ROTATOR CUFF REPAIR AND BICEPS TENODESIS.- RNFA;  Surgeon: Edie Norleen PARAS, MD;  Location: [facility];  Service: Orthopedics;  Laterality: Left;   SHOULDER ARTHROSCOPY WITH SUBACROMIAL DECOMPRESSION, ROTATOR CUFF REPAIR AND BICEP TENDON REPAIR Left 02/02/2023   Procedure: LEFT SHOULDER ARTHROSCOPY WITH DEBRIDEMENT AND REPAIR OF RECURRENT ROTATOR CUFF TEAR OF LEFT SHOULDER;  Surgeon: Edie Norleen PARAS, MD;  Location: [facility];  Service: Orthopedics;  Laterality: Left;   TONSILLECTOMY     Patient Active Problem List   Diagnosis Date Noted   Left middle cerebral artery stroke (HCC) 06/14/2024   Hyperlipidemia 06/07/2024   Right arm pain 06/07/2024   Acute cerebrovascular accident (CVA) (HCC) 06/07/2024   Acute CVA (cerebrovascular accident) (HCC) 06/06/2024   Status post left rotator cuff repair 02/02/2023   Foraminal stenosis of cervical region 03/23/2022   Left arm weakness 03/23/2022   Paroxysmal atrial fibrillation (HCC) 12/22/2021   Secondary hypercoagulable state (HCC) 12/22/2021   Medication management 10/20/2021   Chronic obstructive pulmonary disease (HCC) 10/19/2021   Alcohol withdrawal delirium (HCC) 06/17/2021   Atrial fibrillation with rapid ventricular response (HCC) 06/17/2021   Macrocytic anemia 06/17/2021   COPD exacerbation (HCC) 06/14/2021   Multifocal pneumonia    CAP (community acquired pneumonia) 06/13/2021   Emphysema lung (HCC) 03/17/2021   Positive hepatitis C antibody test 10/02/2019   Tobacco dependence 09/24/2019   Alcohol use disorder 09/24/2019   Acute kidney injury (HCC) 06/12/2014   Orthostatic hypotension 06/12/2014   EKG abnormality 06/12/2014   Elevated blood pressure reading in office with diagnosis of hypertension 06/12/2014   HTN (hypertension) 04/12/2013    ONSET DATE: 06/06/24  REFERRING DIAG: P36.487 (ICD-10-CM) - Cerebral infarction due to unspecified occlusion or stenosis of left middle cerebral artery   THERAPY DIAG:  No diagnosis found.  Rationale for Evaluation and Treatment: Rehabilitation  SUBJECTIVE:   SUBJECTIVE STATEMENT: *** Pt reports pain in his side  Pt accompanied by: significant other  PERTINENT HISTORY: Pt is a 68 y.o. male admitted 6/19. He presented  to the ED with confusion, R side weakness, slurred speech, and L gaze. MRI revealed multiple acute infarcts in the L MCA and left PCA territories. PMH: daily  alcohol use, HTN, a fib not on anticoagulation (secondary to cost per family, per chart review patient reported not liking how the medication made him feel), ongoing smoking, hep C D/c from CIR 06/25/24      PRECAUTIONS: Fall, will monitor  WEIGHT BEARING RESTRICTIONS: No  PAIN:  Are you having pain? Yes: NPRS scale: 3/10 Pain location: left side Pain description: aching Aggravating factors: sitting Relieving factors: movement, heat  FALLS: Has patient fallen in last 6 months? Yes. Number of falls multiple, will monitor in functional context   LIVING ENVIRONMENT: Lives with: lives with their spouse Lives in: House/apartment   PLOF: Independent  PATIENT GOALS: improve independence  OBJECTIVE:  Note: Objective measures were completed at Evaluation unless otherwise noted.  HAND DOMINANCE: Right  ADLs: Overall ADLs: increased time required Transfers/ambulation related to ADLs:  UB Dressing: supervision LB Dressing:supervision Toileting: supervision Bathing: min A/ supervision Tub Shower transfers: tub shower uses BSC, has grab bar, min A Equipment: bed side commode  IADLs:Dependent for home management prior to CVA  Community mobility: supervision Medication management: neighbor is Teacher, English as a foreign language: Engineer, technical sales, wife to helps  Handwriting: 50% legible  MOBILITY STATUS: supervision    ACTIVITY TOLERANCE: Activity tolerance: decreased activity tolerance since CVA   UPPER EXTREMITY ROM:  Pt with history of shoulder surgery x 2 that did not go well and he has had limited use since then  Active ROM Right eval Left eval  Shoulder flexion 120 45  Shoulder abduction  45  Shoulder adduction    Shoulder extension    Shoulder internal rotation    Shoulder external rotation    Elbow flexion    Elbow extension -25   Wrist flexion    Wrist extension    Wrist ulnar deviation    Wrist radial deviation    Wrist pronation    Wrist supination    (Blank  rows = not tested)  UPPER EXTREMITY MMT:     MMT Right eval Left eval  Shoulder flexion 4/5 3-/5  Shoulder abduction    Shoulder adduction    Shoulder extension    Shoulder internal rotation    Shoulder external rotation    Middle trapezius    Lower trapezius    Elbow flexion 3+/5 4/5  Elbow extension 3+/5 4-/5  Wrist flexion    Wrist extension    Wrist ulnar deviation    Wrist radial deviation    Wrist pronation    Wrist supination    (Blank rows = not tested)  HAND FUNCTION: Grip strength: Right: 65 lbs; Left: 65 lbs  COORDINATION: 9 Hole Peg test: Right: 69 secs sec; Left: 40.73 sec  SENSATION: WFL    COGNITION: Overall cognitive status: Impaired, short term memory deficits, not oriented to day, date or month, orineted to year and why he is attending therapy Dificutly following simple commands, decreased sustained attention in a quiet environment VISION: Subjective report: Pt reports blurry vision at times  Baseline vision: Wears glasses all the time   VISION ASSESSMENT: R inattention Tracking/Visual pursuits: Decreased smoothness of eye movement to Right superior field and Decreased smoothness of eye movement to Right inferior field  Number cancellation task: 19/37 missed 51%, pt only completed almost 1/2 page of items with increased time due to decreased attention. Items were missed on both sides  but greater on right     OBSERVATIONS: Pt accompanied by his wife demonstrates poor awareness and cognitive deficits                                                                                                                             TREATMENT DATE:   ***:  ***   07/01/24- eval      PATIENT EDUCATION: Education details: *** role of OT, potential goals Person educated: Patient and Spouse Education method: Explanation Education comprehension: verbalized understanding  HOME EXERCISE PROGRAM: n/a   GOALS: Goals reviewed with patient?  Yes  SHORT TERM GOALS: Target date: 07/31/24  I with HEP.  Goal status: INITIAL  2.  Pt will perform all basic ADLS mod I  Goal status: INITIAL  3.  Pt will sustain attention to a functional or visual perceptual task x 15 mins with no more than min re-directs.  Goal status: INITIAL  4.  Pt will perform tabletop scanning with 75% or better accuracy.  Goal status: INITIAL  5. Pt / wife will verbalize understanding of compensatory strategies for vision and cognition. Goal status: INITIAL  6. Pt will demonstrate improved RUE fine motor coordination as evidenced by decreasing 9 hole peg test score to 60 secs or less  Goal status: inital  LONG TERM GOALS: Target date: 09/23/24  I with updated HEP  Goal status: INITIAL  2.  Pt will perform environmental scanning tasks in a busy environment with 85% or better accuracy.  Goal status: INITIAL  3.  Pt will perform table top scanning activities with 85% or better accuracy  Goal status: INITIAL  4.  Pt will demonstrate improved RUE fine motor coordination as eveidnced by decreasing 9 hole peg test score to 50 secs or less  Goal status: inital   5.  Pt will demonstrate improved RUE functional use as evidenced by retrieving a 3 lbs weight from shelf at 125* without drops.  Goal status: INITIAL   ASSESSMENT:  CLINICAL IMPRESSION: *** Pt is a 68 y.o. male hopitalized 6/19. He presented to the ED with confusion, R side weakness, slurred speech, and L gaze. MRI revealed multiple acute infarcts in the L MCA and left PCA territories. PMH: daily alcohol use, HTN, a fib not on anticoagulation (secondary to cost per family, per chart review patient reported not liking how the medication made him feel), ongoing smoking, hep C,  hx of shoulder surgery x 2 to LUE.D/C from CIR 06/25/24 Pt presents with the performance deficits below. Pt can benefit from skilled occupational therapy to address these deficits in order to maximize pt's safety and I  with ADLs/ IADLs.    PERFORMANCE DEFICITS: in functional skills including ADLs, IADLs, coordination, dexterity, ROM, strength, Fine motor control, Gross motor control, mobility, balance, endurance, decreased knowledge of precautions, decreased knowledge of use of DME, vision, and UE functional use, cognitive skills including attention, memory, orientation, problem solving, safety awareness, sequencing, thought, and  understand, and psychosocial skills including coping strategies, environmental adaptation, habits, interpersonal interactions, and routines and behaviors.   IMPAIRMENTS: are limiting patient from ADLs, IADLs, rest and sleep, education, play, leisure, and social participation.   CO-MORBIDITIES: may have co-morbidities  that affects occupational performance. Patient will benefit from skilled OT to address above impairments and improve overall function.  MODIFICATION OR ASSISTANCE TO COMPLETE EVALUATION: Min-Moderate modification of tasks or assist with assess necessary to complete an evaluation.  OT OCCUPATIONAL PROFILE AND HISTORY: Detailed assessment: Review of records and additional review of physical, cognitive, psychosocial history related to current functional performance.  CLINICAL DECISION MAKING: LOW - limited treatment options, no task modification necessary  REHAB POTENTIAL: Good  EVALUATION COMPLEXITY: Low    PLAN:  OT FREQUENCY: 1x/week plus eval  OT DURATION: 12 weeks  PLANNED INTERVENTIONS: 97168 OT Re-evaluation, 97535 self care/ADL training, 02889 therapeutic exercise, 97530 therapeutic activity, 97112 neuromuscular re-education, 97140 manual therapy, 97116 gait training, 02964 ultrasound, 97018 paraffin, 02989 moist heat, 97010 cryotherapy, 97129 Cognitive training (first 15 min), 02869 Cognitive training(each additional 15 min), passive range of motion, balance training, functional mobility training, visual/perceptual remediation/compensation, psychosocial  skills training, energy conservation, coping strategies training, patient/family education, and DME and/or AE instructions  RECOMMENDED OTHER SERVICES: ST  CONSULTED AND AGREED WITH PLAN OF CARE: Patient and family member/caregiver  PLAN FOR NEXT SESSION: *** inital HEP for RUE coordination, visual scanning strategies, visual perceptual task with cognitve component, peg design or pipe tree.   Estanislao Harmon, OTR/L 07/10/2024, 9:10 PM

## 2024-07-11 ENCOUNTER — Ambulatory Visit: Admitting: Speech Pathology

## 2024-07-11 ENCOUNTER — Ambulatory Visit: Admitting: Occupational Therapy

## 2024-07-16 ENCOUNTER — Other Ambulatory Visit: Payer: Self-pay | Admitting: Family Medicine

## 2024-07-16 DIAGNOSIS — J439 Emphysema, unspecified: Secondary | ICD-10-CM

## 2024-07-16 NOTE — Telephone Encounter (Unsigned)
 Copied from CRM [id number]. Topic: Clinical - Medication Refill >> Jul 16, 2024  2:23 PM Charlet HERO wrote: Medication: TRELEGY ELLIPTA  100-62.5-25 MCG/ACT AEPB  Has the patient contacted their pharmacy? No Wife is calling informed she can order  This is the patient's preferred pharmacy:  [facility] [address], [place] - [address] [place] [address] Phone: [phone] Fax: [phone]   Is this the correct pharmacy for this prescription? Yes If no, delete pharmacy and type the correct one.   Has the prescription been filled recently? Yes  Is the patient out of the medication? Yes  Has the patient been seen for an appointment in the last year OR does the patient have an upcoming appointment? Yes  Can we respond through MyChart? No  Agent: Please be advised that Rx refills may take up to 3 business days. We ask that you follow-up with your pharmacy.

## 2024-07-17 ENCOUNTER — Ambulatory Visit: Admitting: Speech Pathology

## 2024-07-17 ENCOUNTER — Other Ambulatory Visit: Payer: Self-pay | Admitting: Family Medicine

## 2024-07-17 ENCOUNTER — Ambulatory Visit: Admitting: Occupational Therapy

## 2024-07-17 DIAGNOSIS — J439 Emphysema, unspecified: Secondary | ICD-10-CM

## 2024-07-17 NOTE — Telephone Encounter (Signed)
 Signed 07/17/24, duplicate request.  Requested Prescriptions  Pending Prescriptions Disp Refills   TRELEGY ELLIPTA  100-62.5-25 MCG/ACT AEPB 180 each 0    Sig: INHALE 1 PUFF EVERY DAY     Off-Protocol Failed - 07/17/2024  2:15 PM      Failed - Medication not assigned to a protocol, review manually.      Passed - Valid encounter within last 12 months    Recent Outpatient Visits           4 months ago Rash and nonspecific skin eruption   [facility], [place]   7 months ago Essential hypertension   [facility] [facility], MD   1 year ago Essential hypertension   [facility] [facility], MD   2 years ago Essential hypertension   [place] [facility], [place]   2 years ago Tendinitis of left shoulder   [facility] [place], PA-C

## 2024-07-22 NOTE — Progress Notes (Deleted)
 [facility] [place]  ASSESSMENT / PLAN: Ronald Lowe is a 68 y.o. male with symptomatic left carotid artery stenosis. Post stroke delirium and agitation prompted exclusion from urgent carotid endarterectomy. Polysubstance abuse.   Recommend:  Abstinence from all tobacco products. Blood glucose control with goal A1c < 7%. Blood pressure control with goal blood pressure < 130/80 mmHg. Lipid reduction therapy with goal LDL-C < 55 mg/dL. Aspirin  81mg  by mouth daily. Clopidogrel  75mg  by mouth daily. Atorvastatin  40-80mg  PO QD (or other high intensity statin therapy).    CHIEF COMPLAINT: ***  HISTORY OF PRESENT ILLNESS: Ronald Lowe is a 68 y.o. male ***  VASCULAR SURGICAL HISTORY: ***  VASCULAR RISK FACTORS: {FINDINGS; POSITIVE NEGATIVE:[id number]} history of stroke / transient ischemic attack. {FINDINGS; POSITIVE NEGATIVE:820-259-8902} history of coronary artery disease. *** history of PCI. *** history of CABG.  {FINDINGS; POSITIVE NEGATIVE:[id number]} history of diabetes mellitus. Last A1c ***. {FINDINGS; POSITIVE NEGATIVE:820-259-8902} history of smoking. *** actively smoking. {FINDINGS; POSITIVE NEGATIVE:820-259-8902} history of hypertension. *** drug regimen with *** control. {FINDINGS; POSITIVE NEGATIVE:[id number]} history of chronic kidney disease.  Last GFR ***. CKD {stage:30421363}. {FINDINGS; POSITIVE NEGATIVE:820-259-8902} history of chronic obstructive pulmonary disease, treated with ***.  FUNCTIONAL STATUS: ECOG performance status: {findings; ecog performance status:31780} Ambulatory status: {TNHAmbulation:25868}  CAREY 1 AND 3 YEAR INDEX Male (2pts) 75-79 or 80-84 (2pts) >84 (3pts) Dependence in toileting (1pt) Partial or full dependence in dressing (1pt) History of malignant neoplasm (2pts) CHF (3pts) COPD (1pts) CKD (3pts)  0-3 pts 6% 1 year mortality ; 21% 3 year mortality 4-5 pts 12% 1 year mortality ; 36% 3 year mortality >5 pts 21%  1 year mortality; 54% 3 year mortality   Past Medical History:  Diagnosis Date   Aortic atherosclerosis (HCC)    Cervical spinal stenosis    Chronic kidney disease    COPD (chronic obstructive pulmonary disease) (HCC)    Coronary artery calcification seen on CT scan    ETOH abuse    a.) 28+ standard drinks/week   GERD (gastroesophageal reflux disease)    History of amiodarone  therapy    a.) in the past; self discontinued   History of medication noncompliance    a.) reported 10/2022 that he has been off NOAC for several months secondary to vertiginous symptoms, not acting like himself, and depression; b.) off/not taking as prescribed: antiarrhythmics, antihypertensives, statins, and MDIs   Hyperlipidemia    Hypertension    Long term current use of anticoagulant    a.) rivaroxaban    PAF (paroxysmal atrial fibrillation) (HCC)    a.) CHA2DS2VASc = 3 (age, HTN, vascular disease history);  b.) rate/rhythm maintained on oral carvediolol (self discontinued diltiazem  + amiodarone  + metoprolol ); chronically anticoagulated with rivaroxaban  (switched from apixaban  11/17/2022)   Tobacco abuse     Past Surgical History:  Procedure Laterality Date   SHOULDER ARTHROSCOPY WITH SUBACROMIAL DECOMPRESSION, ROTATOR CUFF REPAIR AND BICEP TENDON REPAIR Left 12/07/2022   Procedure: SHOULDER ARTHROSCOPY WITH DEBRIDEMENT, DECOMPRESSION, ROTATOR CUFF REPAIR AND BICEPS TENODESIS.- RNFA;  Surgeon: Edie Norleen PARAS, MD;  Location: [facility] ORS;  Service: Orthopedics;  Laterality: Left;   SHOULDER ARTHROSCOPY WITH SUBACROMIAL DECOMPRESSION, ROTATOR CUFF REPAIR AND BICEP TENDON REPAIR Left 02/02/2023   Procedure: LEFT SHOULDER ARTHROSCOPY WITH DEBRIDEMENT AND REPAIR OF RECURRENT ROTATOR CUFF TEAR OF LEFT SHOULDER;  Surgeon: Edie Norleen PARAS, MD;  Location: [facility] OR[facility];  Service: Orthopedics;  Laterality: Left;   TONSILLECTOMY      Family History  Problem Relation Age of Onset  Emphysema Father    CAD Father        CABG    Stroke Mother    CAD Brother 56       CABG   Hypertension Brother     Social History   Socioeconomic History   Marital status: Married    Spouse name: [name] Irving   Number of children: 2   Years of education: Not on file   Highest education level: Not on file  Occupational History   Occupation: retired  Tobacco Use   Smoking status: Every Day    Current packs/day: 0.25    Average packs/day: 0.3 packs/day for 53.6 years (13.4 ttl pk-yrs)    Types: Cigarettes    Start date: 12/19/1970   Smokeless tobacco: Former   Tobacco comments:    Smokes 3 packs of cigarettes a week. 06/30/23 Tay  Vaping Use   Vaping status: Never Used  Substance and Sexual Activity   Alcohol use: Yes    Alcohol/week: 28.0 standard drinks of alcohol    Types: 14 Cans of beer, 14 Shots of liquor per week   Drug use: Yes    Types: Marijuana    Comment: + cocaine UDS 06/16/2021   Sexual activity: Yes    Partners: Female    Birth control/protection: None  Other Topics Concern   Not on file  Social History Narrative   Lives with wife.   Social Drivers of Corporate investment banker Strain: Low Risk  (09/29/2023)   Overall Financial Resource Strain (CARDIA)    Difficulty of Paying Living Expenses: Not hard at all  Food Insecurity: No Food Insecurity (09/29/2023)   Hunger Vital Sign    Worried About Running Out of Food in the Last Year: Never true    Ran Out of Food in the Last Year: Never true  Transportation Needs: No Transportation Needs (09/29/2023)   PRAPARE - Administrator, Civil Service (Medical): No    Lack of Transportation (Non-Medical): No  Physical Activity: Inactive (09/29/2023)   Exercise Vital Sign    Days of Exercise per Week: 0 days    Minutes of Exercise per Session: 0 min  Stress: No Stress Concern Present (09/29/2023)   [employer] of Occupational Health - Occupational Stress Questionnaire    Feeling of Stress : Not at all  Social Connections: Moderately  Integrated (09/29/2023)   Social Connection and Isolation Panel    Frequency of Communication with Friends and Family: More than three times a week    Frequency of Social Gatherings with Friends and Family: More than three times a week    Attends Religious Services: More than 4 times per year    Active Member of [employer] or Organizations: No    Attends Banker Meetings: Never    Marital Status: Married  Catering manager Violence: Not At Risk (09/29/2023)   Humiliation, Afraid, Rape, and Kick questionnaire    Fear of Current or Ex-Partner: No    Emotionally Abused: No    Physically Abused: No    Sexually Abused: No    No Known Allergies  Current Outpatient Medications  Medication Sig Dispense Refill   polyethylene glycol powder (GLYCOLAX /MIRALAX ) 17 GM/SCOOP powder Take 17 g by mouth 2 (two) times daily as needed. 3350 g 1   acetaminophen  (TYLENOL ) 325 MG tablet Take 2 tablets (650 mg total) by mouth every 4 (four) hours as needed for mild pain (pain score 1-3) or fever (or temp > 37.5  C (99.5 F)).     albuterol  (VENTOLIN  HFA) 108 (90 Base) MCG/ACT inhaler Inhale 1-2 puffs into the lungs every 6 (six) hours as needed for wheezing or shortness of breath. 6.7 g 5   aspirin  EC 81 MG tablet Take 1 tablet (81 mg total) by mouth daily. Swallow whole.     atorvastatin  (LIPITOR) 10 MG tablet Take 1 tablet (10 mg total) by mouth daily. 30 tablet 0   clopidogrel  (PLAVIX ) 75 MG tablet Take 1 tablet (75 mg total) by mouth daily. 30 tablet 0   cyanocobalamin  1000 MCG tablet Take 1 tablet (1,000 mcg total) by mouth daily. 30 tablet 0   famotidine  (PEPCID ) 40 MG tablet Take 1 tablet (40 mg total) by mouth daily. 30 tablet 0   folic acid  (FOLVITE ) 1 MG tablet Take 1 tablet (1 mg total) by mouth daily. 30 tablet 0   lisinopril  (ZESTRIL ) 20 MG tablet Take 1 tablet (20 mg total) by mouth daily. 15 tablet 0   Multiple Vitamin (MULTIVITAMIN WITH MINERALS) TABS tablet Take 1 tablet by mouth daily.      nicotine  (NICODERM CQ  - DOSED IN MG/24 HOURS) 14 mg/24hr patch 14 mg patch daily x 1 week then 7 mg patch daily x 3 weeks and stop 28 patch 8   QUEtiapine  (SEROQUEL ) 25 MG tablet Take 1 tablet (25 mg total) by mouth 2 (two) times daily as needed (aggitation). 30 tablet 0   thiamine  (VITAMIN B1) 100 MG/ML injection Inject 1 mL (100 mg total) into the vein daily. 30 mL 0   traZODone  (DESYREL ) 50 MG tablet Take 1 tablet (50 mg total) by mouth at bedtime as needed for sleep. 30 tablet 0   TRELEGY ELLIPTA  100-62.5-25 MCG/ACT AEPB INHALE 1 PUFF BY MOUTH EVERY DAY 180 each 0   No current facility-administered medications for this visit.    PHYSICAL EXAM There were no vitals filed for this visit.  Constitutional: *** appearing. *** distress. Appears *** nourished.  Neurologic: CN ***. *** focal findings. *** sensory loss. Psychiatric: *** Mood and affect symmetric and appropriate. Eyes: *** No icterus. No conjunctival pallor. Ears, nose, throat: *** mucous membranes moist. Midline trachea.  Cardiac: *** rate and rhythm.  Respiratory: *** unlabored. Abdominal: *** soft, non-tender, non-distended.  Peripheral vascular: *** Extremity: *** edema. *** cyanosis. *** pallor.  Skin: *** gangrene. *** ulceration.  Lymphatic: *** Stemmer's sign. *** palpable lymphadenopathy.    PERTINENT LABORATORY AND RADIOLOGIC DATA  Most recent CBC    Latest Ref Rng & Units 06/24/2024    5:21 AM 06/17/2024    5:24 AM 06/13/2024    4:23 AM  CBC  WBC 4.0 - 10.5 K/uL 6.4  5.4  5.6   Hemoglobin 13.0 - 17.0 g/dL 86.3  85.3  84.3   Hematocrit 39.0 - 52.0 % 38.2  40.8  44.9   Platelets 150 - 400 K/uL 383  319  218      Most recent CMP    Latest Ref Rng & Units 06/24/2024    5:21 AM 06/17/2024    5:24 AM 06/13/2024    4:23 AM  CMP  Glucose 70 - 99 mg/dL 898  94  94   BUN 8 - 23 mg/dL 12  12  15    Creatinine 0.61 - 1.24 mg/dL 8.90  8.89  9.11   Sodium 135 - 145 mmol/L 135  136  138   Potassium 3.5 - 5.1  mmol/L 3.9  3.7  4.1   Chloride 98 -  111 mmol/L 105  101  106   CO2 22 - 32 mmol/L 22  25  17    Calcium  8.9 - 10.3 mg/dL 8.9  9.1  8.7   Total Protein 6.5 - 8.1 g/dL  6.6    Total Bilirubin 0.0 - 1.2 mg/dL  1.1    Alkaline Phos 38 - 126 U/L  42    AST 15 - 41 U/L  38    ALT 0 - 44 U/L  28      Renal function CrCl cannot be calculated (Patient's most recent lab result is older than the maximum 21 days allowed.).  Hgb A1c MFr Bld (%)  Date Value  06/07/2024 4.2 (L)    LDL Chol Calc (NIH)  Date Value Ref Range Status  07/01/2022 54 0 - 99 mg/dL Final   LDL Cholesterol  Date Value Ref Range Status  06/07/2024 66 0 - 99 mg/dL Final    Comment:           Total Cholesterol/HDL:CHD Risk Coronary Heart Disease Risk Table                     Men   Women  1/2 Average Risk   3.4   3.3  Average Risk       5.0   4.4  2 X Average Risk   9.6   7.1  3 X Average Risk  23.4   11.0        Use the calculated Patient Ratio above and the CHD Risk Table to determine the patient's CHD Risk.        ATP III CLASSIFICATION (LDL):  <100     mg/dL   Optimal  899-870  mg/dL   Near or Above                    Optimal  130-159  mg/dL   Borderline  839-810  mg/dL   High  >809     mg/dL   Very High Performed at [facility], [address] [place], [place] [address]      Vascular Imaging: ***  [name]. [name], MD FACS [facility] Phone Number: [phone] 07/22/2024 3:08 PM   Total time spent on preparing this encounter including chart review, data review, collecting history, examining the patient, and coordinating care: {tnhtimebilling:26202} {billinglist:27273}  Portions of this report may have been transcribed using voice recognition software.  Every effort has been made to ensure accuracy; however, inadvertent computerized transcription errors may still be present.

## 2024-07-23 ENCOUNTER — Ambulatory Visit: Attending: Vascular Surgery | Admitting: Vascular Surgery

## 2024-07-24 ENCOUNTER — Ambulatory Visit: Admitting: Occupational Therapy

## 2024-07-24 ENCOUNTER — Other Ambulatory Visit: Payer: Self-pay | Admitting: Cardiovascular Disease

## 2024-07-24 ENCOUNTER — Ambulatory Visit: Admitting: Speech Pathology

## 2024-08-07 ENCOUNTER — Ambulatory Visit: Admitting: Family Medicine

## 2024-08-15 ENCOUNTER — Other Ambulatory Visit (HOSPITAL_COMMUNITY): Payer: Self-pay

## 2024-08-22 ENCOUNTER — Encounter: Payer: Self-pay | Admitting: Physical Medicine & Rehabilitation

## 2024-08-22 ENCOUNTER — Encounter: Attending: Physical Medicine & Rehabilitation | Admitting: Physical Medicine & Rehabilitation

## 2024-08-22 VITALS — BP 169/94 | HR 79 | Ht 72.0 in | Wt 152.8 lb

## 2024-08-22 DIAGNOSIS — I69319 Unspecified symptoms and signs involving cognitive functions following cerebral infarction: Secondary | ICD-10-CM | POA: Diagnosis not present

## 2024-08-22 DIAGNOSIS — I63512 Cerebral infarction due to unspecified occlusion or stenosis of left middle cerebral artery: Secondary | ICD-10-CM | POA: Diagnosis not present

## 2024-08-22 NOTE — Patient Instructions (Signed)
 Please make appt with Dr Raguel Blush

## 2024-08-22 NOTE — Progress Notes (Signed)
 Subjective:    Patient ID: Ronald Lowe, male    DOB: 06/26/1956, 68 y.o.   MRN: [id number] 68 y.o. right-handed male with history significant for hypertension hepatitis C hyperlipidemia paroxysmal atrial fibrillation noncompliant with anticoagulation GERD CKD with creatinine baseline 0.94-1.53 COPD followed by pulmonary services alcohol and tobacco use.  Per chart review lives with spouse.  Modified independent prior to admission reportedly still working.  Presented 06/06/2024 with altered mental status and right side weakness.  Cranial CT scan showed subtle hypoattenuation in the anterior left insula concerning for acute infarct.  Moderate chronic microvascular ischemic disease.  CTA with occluded proximal left M3 MCA.  Critical nearly occlusive stenosis of the left carotid bifurcation.  Degree of stenosis was difficult to quantify given the degree of bulky calcific atherosclerosis but likely an 80%.  Approximately 55% stenosis of the right ICA.  Patient did not receive tPA.  MRI showed multiple acute infarcts in the left MCA and left PCA territories including left basal ganglia left posterior limb of internal capsule left anterior insula and overlying left frontal and parietal lobes in the left occipital lobe.  No mass effect.  Admission chemistries positive cocaine benzos and amphetamines alcohol negative creatinine 1.53 total bilirubin 2.0.  Echocardiogram with ejection fraction of 60 to 65% no wall motion abnormalities grade 1 diastolic dysfunction.  Vascular surgery Dr. [name] for symptomatic left carotid stenosis and at this time wished to hold on any surgical intervention until mental status improved and this could be addressed as an outpatient.  He was cleared to begin aspirin  and Plavix  for CVA prophylaxis.  Subcutaneous Lovenox  for DVT prophylaxis.  Currently on dysphagia #2 thin liquid diet.  Therapy evaluations completed due to patient decreased functional mobility was admitted for a  comprehensive rehab program.     Hospital Course: Ronald Lowe was admitted to rehab 06/14/2024 for inpatient therapies to consist of PT, ST and OT at least three hours five days a week. Past admission physiatrist, therapy team and rehab RN have worked together to provide customized collaborative inpatient rehab.  Pertaining to patient's left MCA scattered infarct likely etiology due to symptomatic proximal ICA stenosis remained stable.  Patient would remain on aspirin  and Plavix  therapy.  Lovenox  for DVT prophylaxis.  Symptomatic left ICA stenosis follow-up outpatient Dr. [name] for possible surgical intervention.  Mood stabilization behavior with noted history of alcohol use monitoring for any signs of withdrawal enclosure bed added for patient's fall risk as well as delirium precautions with Seroquel  as indicated and trazodone  to help aid in sleep.  An enclosure bed was initially used for safety due to patient's decreased safety awareness.  He was on a dysphagia #3 thin liquid diet addressed and advanced as per speech therapy.  History of COPD with tobacco use continued inhalers NicoDerm patch added providing counts regards to cessation of nicotine  products.  Urine drug screen was positive for marijuana again receiving counsel regards to cessation of illicit drug use.  Patient with history of PAF noncompliant with anticoagulation cardiac rate controlled.  AKI/CKD chemistries remained stable follow-up outpatient.  Blood pressure is controlled and monitored patient had been on lisinopril  prior to admission follow-up outpatient.     Blood pressures were monitored on TID basis and remained controlled and monitored         Rehab course: During patient's stay in rehab weekly team conferences were held to monitor patient's progress, set goals and discuss barriers to discharge. At admission, patient required min mod assist  5 feet to person hand-held assist minimal assist sit to stand   He/She  has had  improvement in activity tolerance, balance, postural control as well as ability to compensate for deficits. He/She has had improvement in functional use RUE/LUE  and RLE/LLE as well as improvement in awareness.  Patient performed supine to sit edge of bed with supervision.  Occasional verbal cues needed for safety.  Perform sit to stand transfers from edge of bed wheelchair with contact-guard light minimal assist for safety awareness.  Ambulates around the nursing day room and from day room 2 at end of session using no assistive device with overall contact-guard minimal assist.  Supervision upper body bathing contact-guard lower body bathing supervision upper body dressing contact-guard lower body dressing.  SLP follow-up patient showing improvement in attention.  Scored a 14/30 on SLUMS.  Significant improvement in cognitive function he continues to present with impairments warranting continued speech intervention answering basic yes/no questions 83% accurate.  Full family teaching completed plan discharge to home.     Admit date: 06/14/2024 Discharge date: 06/25/2024 HPI 68 year old male with left posterior branch MCA infarct.  He has left ICA stenosis and surgical intervention is being considered.  Unfortunately he had missed his vascular surgery follow-up visit and has another 1 scheduled later this month. According to wife doing fairly well from a mobility standpoint no falls.  He does not require an assistive device.  She has concerns about his cognitive status.  He is confused at times and sometimes even urinates in areas outside of the bathroom. Some problems with dressing , dons shirt backwards   Less patience than prior to CVA The patient has not followed up with his primary care physician yet The patient has not followed up with neurologist  Pain Inventory Average Pain 5 Pain Right Now 5 My pain is Patient reports no pain  LOCATION OF PAIN  no pain  BOWEL Number of stools per week:  6 Oral laxative use No  Type of laxative No Enema or suppository use No  History of colostomy No  Incontinent No  BLADDER Normal In and out cath, frequency N/A Able to self cath No  Bladder incontinence No  Frequent urination No  Leakage with coughing No  Difficulty starting stream No  Incomplete bladder emptying No    Mobility walk without assistance how many minutes can you walk? 7 ability to climb steps?  yes do you drive?  no Do you have any goals in this area?  yes  Function retired Do you have any goals in this area?  yes  Neuro/Psych No problems in this area confusion depression Disoriented at times spouse reports patient has a lot of confusion and disoriented.  Spouse reports patient he has issue with remembering things and has a fear that people or spouse is trying to steal from him  Prior Studies Any changes since last visit?  no  Physicians involved in your care PCP: Raguel Blush, MD Cardiologist:  Maude Emmer, MD   Family History  Problem Relation Age of Onset   Emphysema Father    CAD Father        CABG   Stroke Mother    CAD Brother 23       CABG   Hypertension Brother    Social History   Socioeconomic History   Marital status: Married    Spouse name: [name] Irving   Number of children: 2   Years of education: Not on file   Highest  education level: Not on file  Occupational History   Occupation: retired  Tobacco Use   Smoking status: Every Day    Current packs/day: 0.25    Average packs/day: 0.3 packs/day for 53.7 years (13.4 ttl pk-yrs)    Types: Cigarettes    Start date: 12/19/1970   Smokeless tobacco: Former   Tobacco comments:    Smokes 3 packs of cigarettes a week. 06/30/23 Tay  Vaping Use   Vaping status: Never Used  Substance and Sexual Activity   Alcohol use: Yes    Alcohol/week: 28.0 standard drinks of alcohol    Types: 14 Cans of beer, 14 Shots of liquor per week   Drug use: Yes    Types: Marijuana    Comment: +  cocaine UDS 06/16/2021   Sexual activity: Yes    Partners: Female    Birth control/protection: None  Other Topics Concern   Not on file  Social History Narrative   Lives with wife.   Social Drivers of Corporate investment banker Strain: Low Risk  (09/29/2023)   Overall Financial Resource Strain (CARDIA)    Difficulty of Paying Living Expenses: Not hard at all  Food Insecurity: No Food Insecurity (09/29/2023)   Hunger Vital Sign    Worried About Running Out of Food in the Last Year: Never true    Ran Out of Food in the Last Year: Never true  Transportation Needs: No Transportation Needs (09/29/2023)   PRAPARE - Administrator, Civil Service (Medical): No    Lack of Transportation (Non-Medical): No  Physical Activity: Inactive (09/29/2023)   Exercise Vital Sign    Days of Exercise per Week: 0 days    Minutes of Exercise per Session: 0 min  Stress: No Stress Concern Present (09/29/2023)   [employer] of Occupational Health - Occupational Stress Questionnaire    Feeling of Stress : Not at all  Social Connections: Moderately Integrated (09/29/2023)   Social Connection and Isolation Panel    Frequency of Communication with Friends and Family: More than three times a week    Frequency of Social Gatherings with Friends and Family: More than three times a week    Attends Religious Services: More than 4 times per year    Active Member of [employer] or Organizations: No    Attends Engineer, structural: Never    Marital Status: Married   Past Surgical History:  Procedure Laterality Date   SHOULDER ARTHROSCOPY WITH SUBACROMIAL DECOMPRESSION, ROTATOR CUFF REPAIR AND BICEP TENDON REPAIR Left 12/07/2022   Procedure: SHOULDER ARTHROSCOPY WITH DEBRIDEMENT, DECOMPRESSION, ROTATOR CUFF REPAIR AND BICEPS TENODESIS.- RNFA;  Surgeon: Edie Norleen PARAS, MD;  Location: [facility];  Service: Orthopedics;  Laterality: Left;   SHOULDER ARTHROSCOPY WITH SUBACROMIAL DECOMPRESSION, ROTATOR  CUFF REPAIR AND BICEP TENDON REPAIR Left 02/02/2023   Procedure: LEFT SHOULDER ARTHROSCOPY WITH DEBRIDEMENT AND REPAIR OF RECURRENT ROTATOR CUFF TEAR OF LEFT SHOULDER;  Surgeon: Edie Norleen PARAS, MD;  Location: [facility];  Service: Orthopedics;  Laterality: Left;   TONSILLECTOMY     Past Medical History:  Diagnosis Date   Aortic atherosclerosis (HCC)    Cervical spinal stenosis    Chronic kidney disease    COPD (chronic obstructive pulmonary disease) (HCC)    Coronary artery calcification seen on CT scan    ETOH abuse    a.) 28+ standard drinks/week   GERD (gastroesophageal reflux disease)    History of amiodarone  therapy    a.) in the past; self discontinued  History of medication noncompliance    a.) reported 10/2022 that he has been off NOAC for several months secondary to vertiginous symptoms, not acting like himself, and depression; b.) off/not taking as prescribed: antiarrhythmics, antihypertensives, statins, and MDIs   Hyperlipidemia    Hypertension    Long term current use of anticoagulant    a.) rivaroxaban    PAF (paroxysmal atrial fibrillation) (HCC)    a.) CHA2DS2VASc = 3 (age, HTN, vascular disease history);  b.) rate/rhythm maintained on oral carvediolol (self discontinued diltiazem  + amiodarone  + metoprolol ); chronically anticoagulated with rivaroxaban  (switched from apixaban  11/17/2022)   Tobacco abuse    BP (!) 169/94 (BP Location: Left Arm, Patient Position: Sitting, Cuff Size: Normal)   Pulse 79   Ht 6' (1.829 m)   Wt 152 lb 12.8 oz (69.3 kg)   SpO2 94%   BMI 20.72 kg/m   Opioid Risk Score:   Fall Risk Score:  `1  Depression screen PHQ 2/9     08/22/2024    1:48 PM 02/22/2024    8:32 AM 12/06/2023   10:17 AM 09/29/2023    2:23 PM 06/20/2023    9:44 AM 12/06/2022    1:49 PM 12/01/2022    3:12 PM  Depression screen PHQ 2/9  Decreased Interest 3 1 0 0 0 0 0  Down, Depressed, Hopeless 3 0 0 0 0 0 0  PHQ - 2 Score 6 1 0 0 0 0 0  Altered sleeping 3  0   0    Tired, decreased energy 3  0   0   Change in appetite 3  0   0   Feeling bad or failure about yourself  3  0   0   Trouble concentrating 3  0   0   Moving slowly or fidgety/restless 3  0   0   Suicidal thoughts 0  0   0   PHQ-9 Score 24  0   0   Difficult doing work/chores Very difficult  Not difficult at all   Not difficult at all       Review of Systems  Constitutional:  Positive for appetite change and unexpected weight change.       Weight gain  Respiratory:  Positive for apnea, cough and shortness of breath.        Emphysema  Cardiovascular:  Positive for palpitations.       Hypertension AFIB  Endocrine:       High blood sugar  Neurological:        Confusion, memory loss CVA       Objective:   Physical Exam Oriented to person and place and year but not month and date. Speech is fluent no evidence of dysarthria Mood and affect frequent inappropriate joking, no lability or agitation noted. Visual fields are intact There is no evidence of facial droop. Finger-nose-finger testing is intact. Motor strength is 5/5 bilateral deltoid bicep tricep grip as well as hip flexor knee extensor ankle dorsiflexion plantarflexion Ambulates without assistive device no evidence of toe drag or knee instability he is able to toe walk and heel walk.      Assessment & Plan:   1.  Left branch MCA infarct with excellent recovery of strength and mobility.  He still has some problems with ADLs such as dressing which appear to be mainly cognitive based. We discussed the cognitive deficits related to his stroke however in some ways it is similar to a sudden onset dementia however  we do not expect it to be progressive and instead may improve at least to some degree over the course of 6 to 12 months. Discussed recommendations for follow-up with neurology referral placed Discussed recommendations for follow-up with neuropsychology referral placed Discussed recommendation for PCP follow-up wife  will call. Unfortunately the patient has refused to go to outpatient therapy because they treat him like a child

## 2024-09-10 ENCOUNTER — Ambulatory Visit: Attending: Vascular Surgery | Admitting: Vascular Surgery

## 2024-09-10 ENCOUNTER — Other Ambulatory Visit: Payer: Self-pay

## 2024-09-10 ENCOUNTER — Telehealth: Payer: Self-pay

## 2024-09-10 ENCOUNTER — Encounter: Payer: Self-pay | Admitting: Vascular Surgery

## 2024-09-10 VITALS — BP 172/117 | HR 68 | Temp 98.0°F | Ht 72.0 in | Wt 153.0 lb

## 2024-09-10 DIAGNOSIS — I6522 Occlusion and stenosis of left carotid artery: Secondary | ICD-10-CM

## 2024-09-10 MED ORDER — CLOPIDOGREL BISULFATE 75 MG PO TABS: 75.0000 mg | ORAL_TABLET | Freq: Every day | ORAL | 0 refills | Status: DC

## 2024-09-10 NOTE — Progress Notes (Signed)
 [facility] [place]  ASSESSMENT / PLAN: Ronald Lowe is a 68 y.o. male with symptomatic left carotid artery stenosis.   Recommend:  Abstinence from all tobacco products. Blood glucose control with goal A1c < 7%. Blood pressure control with goal blood pressure < 130/80 mmHg. Lipid reduction therapy with goal LDL-C < 55 mg/dL. Aspirin  81mg  by mouth daily. Atorvastatin  40-80mg  PO QD (or other high intensity statin therapy).  Patient would benefit from carotid revascularization at this time.  I have completed Share Decision Making with Ronald Lowe and his wife prior to surgery.  Conversations included: -Discussion of all treatment options including carotid endarterectomy (CEA), CAS (which includes transcarotid artery revascularization (TCAR)), and optimal medical therapy (OMT)). -Explanation of risks and benefits for each option specific to Ronald Lowe's clinical situation. -Integration of clinical guidelines as it relates to the patient's history and co-morbidities -Discussion and incorporation of Ronald Lowe and their personal preferences and priorities in choosing a treatment plan.  I think carotid endarterectomy would be the best option for the patient.  Both he and his wife agree.  We reviewed the risk of stroke, cranial nerve injury, hematoma.  He and his wife seem to understand.  Patient does have a personal history of alcoholism and has suffered some issues with cognition and behavior after his most recent stroke.  He will need close attention postoperatively, and I am hopeful that we can discharge him safely before he enters a withdrawal window.  CHIEF COMPLAINT: Recent stroke  HISTORY OF PRESENT ILLNESS: Ronald Lowe is a 68 y.o. male referred to clinic for evaluation of symptomatic left carotid artery stenosis.  Our group had previously seen the patient while he was in inpatient.  He suffered a left MCA stroke, and was found to have fairly  significant left carotid artery stenosis which is calcific.  He was offered carotid endarterectomy.  Unfortunately he became agitated and could not participate in care, and so urgent carotid was deferred to the outpatient setting.  He returns to care today with his wife.  His wife reports that he is having some persistent behavioral and memory issues.  He has reduced his drinking and tobacco use.  He has not suffered any more stroke or TIA symptoms.  We reviewed the risk, benefits, and alternatives to optimal medical therapy, surgical therapy, and stenting.  The patient's wife strongly prefers to avoid stenting.  Past Medical History:  Diagnosis Date   Aortic atherosclerosis    Cervical spinal stenosis    Chronic kidney disease    COPD (chronic obstructive pulmonary disease) (HCC)    Coronary artery calcification seen on CT scan    ETOH abuse    a.) 28+ standard drinks/week   GERD (gastroesophageal reflux disease)    History of amiodarone  therapy    a.) in the past; self discontinued   History of medication noncompliance    a.) reported 10/2022 that he has been off NOAC for several months secondary to vertiginous symptoms, not acting like himself, and depression; b.) off/not taking as prescribed: antiarrhythmics, antihypertensives, statins, and MDIs   Hyperlipidemia    Hypertension    Long term current use of anticoagulant    a.) rivaroxaban    PAF (paroxysmal atrial fibrillation) (HCC)    a.) CHA2DS2VASc = 3 (age, HTN, vascular disease history);  b.) rate/rhythm maintained on oral carvediolol (self discontinued diltiazem  + amiodarone  + metoprolol ); chronically anticoagulated with rivaroxaban  (switched from apixaban  11/17/2022)   Tobacco abuse  Past Surgical History:  Procedure Laterality Date   SHOULDER ARTHROSCOPY WITH SUBACROMIAL DECOMPRESSION, ROTATOR CUFF REPAIR AND BICEP TENDON REPAIR Left 12/07/2022   Procedure: SHOULDER ARTHROSCOPY WITH DEBRIDEMENT, DECOMPRESSION, ROTATOR CUFF  REPAIR AND BICEPS TENODESIS.- RNFA;  Surgeon: Edie Norleen PARAS, MD;  Location: [facility] ORS;  Service: Orthopedics;  Laterality: Left;   SHOULDER ARTHROSCOPY WITH SUBACROMIAL DECOMPRESSION, ROTATOR CUFF REPAIR AND BICEP TENDON REPAIR Left 02/02/2023   Procedure: LEFT SHOULDER ARTHROSCOPY WITH DEBRIDEMENT AND REPAIR OF RECURRENT ROTATOR CUFF TEAR OF LEFT SHOULDER;  Surgeon: Edie Norleen PARAS, MD;  Location: [facility] OR[facility];  Service: Orthopedics;  Laterality: Left;   TONSILLECTOMY      Family History  Problem Relation Age of Onset   Emphysema Father    CAD Father        CABG   Stroke Mother    CAD Brother 16       CABG   Hypertension Brother     Social History   Socioeconomic History   Marital status: Married    Spouse name: Ronald Lowe   Number of children: 2   Years of education: Not on file   Highest education level: Not on file  Occupational History   Occupation: retired  Tobacco Use   Smoking status: Every Day    Current packs/day: 0.25    Average packs/day: 0.3 packs/day for 53.7 years (13.4 ttl pk-yrs)    Types: Cigarettes    Start date: 12/19/1970   Smokeless tobacco: Former   Tobacco comments:    Smokes 3 packs of cigarettes a week. 06/30/23 Tay  Vaping Use   Vaping status: Never Used  Substance and Sexual Activity   Alcohol use: Yes    Alcohol/week: 28.0 standard drinks of alcohol    Types: 14 Cans of beer, 14 Shots of liquor per week   Drug use: Yes    Types: Marijuana    Comment: + cocaine UDS 06/16/2021   Sexual activity: Yes    Partners: Female    Birth control/protection: None  Other Topics Concern   Not on file  Social History Narrative   Lives with wife.   Social Drivers of Corporate investment banker Strain: Low Risk  (09/29/2023)   Overall Financial Resource Strain (CARDIA)    Difficulty of Paying Living Expenses: Not hard at all  Food Insecurity: No Food Insecurity (09/29/2023)   Hunger Vital Sign    Worried About Running Out of Food in the Last Year: Never  true    Ran Out of Food in the Last Year: Never true  Transportation Needs: No Transportation Needs (09/29/2023)   PRAPARE - Administrator, Civil Service (Medical): No    Lack of Transportation (Non-Medical): No  Physical Activity: Inactive (09/29/2023)   Exercise Vital Sign    Days of Exercise per Week: 0 days    Minutes of Exercise per Session: 0 min  Stress: No Stress Concern Present (09/29/2023)   [employer] of Occupational Health - Occupational Stress Questionnaire    Feeling of Stress : Not at all  Social Connections: Moderately Integrated (09/29/2023)   Social Connection and Isolation Panel    Frequency of Communication with Friends and Family: More than three times a week    Frequency of Social Gatherings with Friends and Family: More than three times a week    Attends Religious Services: More than 4 times per year    Active Member of [employer] or Organizations: No    Attends Banker  Meetings: Never    Marital Status: Married  Catering manager Violence: Not At Risk (09/29/2023)   Humiliation, Afraid, Rape, and Kick questionnaire    Fear of Current or Ex-Partner: No    Emotionally Abused: No    Physically Abused: No    Sexually Abused: No    No Known Allergies  Current Outpatient Medications  Medication Sig Dispense Refill   acetaminophen  (TYLENOL ) 325 MG tablet Take 2 tablets (650 mg total) by mouth every 4 (four) hours as needed for mild pain (pain score 1-3) or fever (or temp > 37.5 C (99.5 F)).     albuterol  (VENTOLIN  HFA) 108 (90 Base) MCG/ACT inhaler Inhale 1-2 puffs into the lungs every 6 (six) hours as needed for wheezing or shortness of breath. 6.7 g 5   aspirin  EC 81 MG tablet Take 1 tablet (81 mg total) by mouth daily. Swallow whole.     atorvastatin  (LIPITOR) 10 MG tablet Take 1 tablet (10 mg total) by mouth daily. 30 tablet 0   clopidogrel  (PLAVIX ) 75 MG tablet Take 1 tablet (75 mg total) by mouth daily. 30 tablet 0    cyanocobalamin  1000 MCG tablet Take 1 tablet (1,000 mcg total) by mouth daily. 30 tablet 0   famotidine  (PEPCID ) 40 MG tablet Take 1 tablet (40 mg total) by mouth daily. 30 tablet 0   folic acid  (FOLVITE ) 1 MG tablet Take 1 tablet (1 mg total) by mouth daily. 30 tablet 0   hydrOXYzine  (VISTARIL ) 25 MG capsule Take by mouth.     lisinopril  (ZESTRIL ) 20 MG tablet TAKE 1 TABLET BY MOUTH EVERY DAY 90 tablet 0   Multiple Vitamin (MULTIVITAMIN WITH MINERALS) TABS tablet Take 1 tablet by mouth daily.     nicotine  (NICODERM CQ  - DOSED IN MG/24 HOURS) 14 mg/24hr patch 14 mg patch daily x 1 week then 7 mg patch daily x 3 weeks and stop 28 patch 8   polyethylene glycol powder (GLYCOLAX /MIRALAX ) 17 GM/SCOOP powder Take 17 g by mouth 2 (two) times daily as needed. 3350 g 1   QUEtiapine  (SEROQUEL ) 25 MG tablet Take 1 tablet (25 mg total) by mouth 2 (two) times daily as needed (aggitation). 30 tablet 0   thiamine  (VITAMIN B1) 100 MG/ML injection Inject 1 mL (100 mg total) into the vein daily. 30 mL 0   traZODone  (DESYREL ) 50 MG tablet Take 1 tablet (50 mg total) by mouth at bedtime as needed for sleep. 30 tablet 0   TRELEGY ELLIPTA  100-62.5-25 MCG/ACT AEPB INHALE 1 PUFF BY MOUTH EVERY DAY 180 each 0   No current facility-administered medications for this visit.    PHYSICAL EXAM Vitals:   09/10/24 1349 09/10/24 1350  BP: (!) 178/114 (!) 172/117  Pulse: 68   Temp: 98 F (36.7 C)   SpO2: 93%   Weight: 153 lb (69.4 kg)   Height: 6' (1.829 m)    No acute distress Regular rate and rhythm Unlabored breathing Normal gait and station No extremity weakness No cranial nerve abnormalities   PERTINENT LABORATORY AND RADIOLOGIC DATA  Most recent CBC    Latest Ref Rng & Units 06/24/2024    5:21 AM 06/17/2024    5:24 AM 06/13/2024    4:23 AM  CBC  WBC 4.0 - 10.5 K/uL 6.4  5.4  5.6   Hemoglobin 13.0 - 17.0 g/dL 86.3  85.3  84.3   Hematocrit 39.0 - 52.0 % 38.2  40.8  44.9   Platelets 150 - 400 K/uL 383  319  218      Most recent CMP    Latest Ref Rng & Units 06/24/2024    5:21 AM 06/17/2024    5:24 AM 06/13/2024    4:23 AM  CMP  Glucose 70 - 99 mg/dL 898  94  94   BUN 8 - 23 mg/dL 12  12  15    Creatinine 0.61 - 1.24 mg/dL 8.90  8.89  9.11   Sodium 135 - 145 mmol/L 135  136  138   Potassium 3.5 - 5.1 mmol/L 3.9  3.7  4.1   Chloride 98 - 111 mmol/L 105  101  106   CO2 22 - 32 mmol/L 22  25  17    Calcium  8.9 - 10.3 mg/dL 8.9  9.1  8.7   Total Protein 6.5 - 8.1 g/dL  6.6    Total Bilirubin 0.0 - 1.2 mg/dL  1.1    Alkaline Phos 38 - 126 U/L  42    AST 15 - 41 U/L  38    ALT 0 - 44 U/L  28      Renal function CrCl cannot be calculated (Patient's most recent lab result is older than the maximum 21 days allowed.).  Hgb A1c MFr Bld (%)  Date Value  06/07/2024 4.2 (L)    LDL Chol Calc (NIH)  Date Value Ref Range Status  07/01/2022 54 0 - 99 mg/dL Final   LDL Cholesterol  Date Value Ref Range Status  06/07/2024 66 0 - 99 mg/dL Final    Comment:           Total Cholesterol/HDL:CHD Risk Coronary Heart Disease Risk Table                     Men   Women  1/2 Average Risk   3.4   3.3  Average Risk       5.0   4.4  2 X Average Risk   9.6   7.1  3 X Average Risk  23.4   11.0        Use the calculated Patient Ratio above and the CHD Risk Table to determine the patient's CHD Risk.        ATP III CLASSIFICATION (LDL):  <100     mg/dL   Optimal  899-870  mg/dL   Near or Above                    Optimal  130-159  mg/dL   Borderline  839-810  mg/dL   High  >809     mg/dL   Very High Performed at [facility], [address] [place], [place] [address]     CT angiogram personally reviewed.  This shows bulky calcific plaque in the left carotid artery bifurcation causing critical stenosis.  Appears surgically accessible with endarterectomy.  [name]. [name], MD FACS [facility] Phone Number: [phone] 09/10/2024 4:50  PM   Total time spent on preparing this encounter including chart review, data review, collecting history, examining the patient, and coordinating care: 60 minutes  Portions of this report may have been transcribed using voice recognition software.  Every effort has been made to ensure accuracy; however, inadvertent computerized transcription errors may still be present.

## 2024-09-10 NOTE — Telephone Encounter (Signed)
 Copied from CRM [id number]. Topic: General - Other >> Sep 10, 2024  4:11 PM Debby BROCKS wrote: Reason for CRM: Patient would like all of his medications sent to a new pharmacy: [id number] - [place], [place] - [address]  He also wants refills but does not know what the names of the medications are and would like Dr. Tanda or one of the nurses to go through his list of medications to see what he needs and doesn't need to be refilled

## 2024-09-10 NOTE — H&P (View-Only) (Signed)
 [facility] [place]  ASSESSMENT / PLAN: Ronald Lowe is a 68 y.o. male with symptomatic left carotid artery stenosis.   Recommend:  Abstinence from all tobacco products. Blood glucose control with goal A1c < 7%. Blood pressure control with goal blood pressure < 130/80 mmHg. Lipid reduction therapy with goal LDL-C < 55 mg/dL. Aspirin  81mg  by mouth daily. Atorvastatin  40-80mg  PO QD (or other high intensity statin therapy).  Patient would benefit from carotid revascularization at this time.  I have completed Share Decision Making with Ronald Lowe and his wife prior to surgery.  Conversations included: -Discussion of all treatment options including carotid endarterectomy (CEA), CAS (which includes transcarotid artery revascularization (TCAR)), and optimal medical therapy (OMT)). -Explanation of risks and benefits for each option specific to Ronald Lowe clinical situation. -Integration of clinical guidelines as it relates to the patient's history and co-morbidities -Discussion and incorporation of Ronald Lowe and their personal preferences and priorities in choosing a treatment plan.  I think carotid endarterectomy would be the best option for the patient.  Both he and his wife agree.  We reviewed the risk of stroke, cranial nerve injury, hematoma.  He and his wife seem to understand.  Patient does have a personal history of alcoholism and has suffered some issues with cognition and behavior after his most recent stroke.  He will need close attention postoperatively, and I am hopeful that we can discharge him safely before he enters a withdrawal window.  CHIEF COMPLAINT: Recent stroke  HISTORY OF PRESENT ILLNESS: Ronald Lowe is a 68 y.o. male referred to clinic for evaluation of symptomatic left carotid artery stenosis.  Our group had previously seen the patient while he was in inpatient.  He suffered a left MCA stroke, and was found to have fairly  significant left carotid artery stenosis which is calcific.  He was offered carotid endarterectomy.  Unfortunately he became agitated and could not participate in care, and so urgent carotid was deferred to the outpatient setting.  He returns to care today with his wife.  His wife reports that he is having some persistent behavioral and memory issues.  He has reduced his drinking and tobacco use.  He has not suffered any more stroke or TIA symptoms.  We reviewed the risk, benefits, and alternatives to optimal medical therapy, surgical therapy, and stenting.  The patient's wife strongly prefers to avoid stenting.  Past Medical History:  Diagnosis Date   Aortic atherosclerosis    Cervical spinal stenosis    Chronic kidney disease    COPD (chronic obstructive pulmonary disease) (HCC)    Coronary artery calcification seen on CT scan    ETOH abuse    a.) 28+ standard drinks/week   GERD (gastroesophageal reflux disease)    History of amiodarone  therapy    a.) in the past; self discontinued   History of medication noncompliance    a.) reported 10/2022 that he has been off NOAC for several months secondary to vertiginous symptoms, not acting like himself, and depression; b.) off/not taking as prescribed: antiarrhythmics, antihypertensives, statins, and MDIs   Hyperlipidemia    Hypertension    Long term current use of anticoagulant    a.) rivaroxaban    PAF (paroxysmal atrial fibrillation) (HCC)    a.) CHA2DS2VASc = 3 (age, HTN, vascular disease history);  b.) rate/rhythm maintained on oral carvediolol (self discontinued diltiazem  + amiodarone  + metoprolol ); chronically anticoagulated with rivaroxaban  (switched from apixaban  11/17/2022)   Tobacco abuse  Past Surgical History:  Procedure Laterality Date   SHOULDER ARTHROSCOPY WITH SUBACROMIAL DECOMPRESSION, ROTATOR CUFF REPAIR AND BICEP TENDON REPAIR Left 12/07/2022   Procedure: SHOULDER ARTHROSCOPY WITH DEBRIDEMENT, DECOMPRESSION, ROTATOR CUFF  REPAIR AND BICEPS TENODESIS.- RNFA;  Surgeon: Edie Norleen PARAS, MD;  Location: [facility] ORS;  Service: Orthopedics;  Laterality: Left;   SHOULDER ARTHROSCOPY WITH SUBACROMIAL DECOMPRESSION, ROTATOR CUFF REPAIR AND BICEP TENDON REPAIR Left 02/02/2023   Procedure: LEFT SHOULDER ARTHROSCOPY WITH DEBRIDEMENT AND REPAIR OF RECURRENT ROTATOR CUFF TEAR OF LEFT SHOULDER;  Surgeon: Edie Norleen PARAS, MD;  Location: [facility] ORS;  Service: Orthopedics;  Laterality: Left;   TONSILLECTOMY      Family History  Problem Relation Age of Onset   Emphysema Father    CAD Father        CABG   Stroke Mother    CAD Brother 16       CABG   Hypertension Brother     Social History   Socioeconomic History   Marital status: Married    Spouse name: Ronald Lowe   Number of children: 2   Years of education: Not on file   Highest education level: Not on file  Occupational History   Occupation: retired  Tobacco Use   Smoking status: Every Day    Current packs/day: 0.25    Average packs/day: 0.3 packs/day for 53.7 years (13.4 ttl pk-yrs)    Types: Cigarettes    Start date: 12/19/1970   Smokeless tobacco: Former   Tobacco comments:    Smokes 3 packs of cigarettes a week. 06/30/23 Tay  Vaping Use   Vaping status: Never Used  Substance and Sexual Activity   Alcohol use: Yes    Alcohol/week: 28.0 standard drinks of alcohol    Types: 14 Cans of beer, 14 Shots of liquor per week   Drug use: Yes    Types: Marijuana    Comment: + cocaine UDS 06/16/2021   Sexual activity: Yes    Partners: Female    Birth control/protection: None  Other Topics Concern   Not on file  Social History Narrative   Lives with wife.   Social Drivers of Corporate investment banker Strain: Low Risk  (09/29/2023)   Overall Financial Resource Strain (CARDIA)    Difficulty of Paying Living Expenses: Not hard at all  Food Insecurity: No Food Insecurity (09/29/2023)   Hunger Vital Sign    Worried About Running Out of Food in the Last Year: Never  true    Ran Out of Food in the Last Year: Never true  Transportation Needs: No Transportation Needs (09/29/2023)   PRAPARE - Administrator, Civil Service (Medical): No    Lack of Transportation (Non-Medical): No  Physical Activity: Inactive (09/29/2023)   Exercise Vital Sign    Days of Exercise per Week: 0 days    Minutes of Exercise per Session: 0 min  Stress: No Stress Concern Present (09/29/2023)   [employer] of Occupational Health - Occupational Stress Questionnaire    Feeling of Stress : Not at all  Social Connections: Moderately Integrated (09/29/2023)   Social Connection and Isolation Panel    Frequency of Communication with Friends and Family: More than three times a week    Frequency of Social Gatherings with Friends and Family: More than three times a week    Attends Religious Services: More than 4 times per year    Active Member of [employer] or Organizations: No    Attends Banker  Meetings: Never    Marital Status: Married  Catering manager Violence: Not At Risk (09/29/2023)   Humiliation, Afraid, Rape, and Kick questionnaire    Fear of Current or Ex-Partner: No    Emotionally Abused: No    Physically Abused: No    Sexually Abused: No    No Known Allergies  Current Outpatient Medications  Medication Sig Dispense Refill   acetaminophen  (TYLENOL ) 325 MG tablet Take 2 tablets (650 mg total) by mouth every 4 (four) hours as needed for mild pain (pain score 1-3) or fever (or temp > 37.5 C (99.5 F)).     albuterol  (VENTOLIN  HFA) 108 (90 Base) MCG/ACT inhaler Inhale 1-2 puffs into the lungs every 6 (six) hours as needed for wheezing or shortness of breath. 6.7 g 5   aspirin  EC 81 MG tablet Take 1 tablet (81 mg total) by mouth daily. Swallow whole.     atorvastatin  (LIPITOR) 10 MG tablet Take 1 tablet (10 mg total) by mouth daily. 30 tablet 0   clopidogrel  (PLAVIX ) 75 MG tablet Take 1 tablet (75 mg total) by mouth daily. 30 tablet 0    cyanocobalamin  1000 MCG tablet Take 1 tablet (1,000 mcg total) by mouth daily. 30 tablet 0   famotidine  (PEPCID ) 40 MG tablet Take 1 tablet (40 mg total) by mouth daily. 30 tablet 0   folic acid  (FOLVITE ) 1 MG tablet Take 1 tablet (1 mg total) by mouth daily. 30 tablet 0   hydrOXYzine  (VISTARIL ) 25 MG capsule Take by mouth.     lisinopril  (ZESTRIL ) 20 MG tablet TAKE 1 TABLET BY MOUTH EVERY DAY 90 tablet 0   Multiple Vitamin (MULTIVITAMIN WITH MINERALS) TABS tablet Take 1 tablet by mouth daily.     nicotine  (NICODERM CQ  - DOSED IN MG/24 HOURS) 14 mg/24hr patch 14 mg patch daily x 1 week then 7 mg patch daily x 3 weeks and stop 28 patch 8   polyethylene glycol powder (GLYCOLAX /MIRALAX ) 17 GM/SCOOP powder Take 17 g by mouth 2 (two) times daily as needed. 3350 g 1   QUEtiapine  (SEROQUEL ) 25 MG tablet Take 1 tablet (25 mg total) by mouth 2 (two) times daily as needed (aggitation). 30 tablet 0   thiamine  (VITAMIN B1) 100 MG/ML injection Inject 1 mL (100 mg total) into the vein daily. 30 mL 0   traZODone  (DESYREL ) 50 MG tablet Take 1 tablet (50 mg total) by mouth at bedtime as needed for sleep. 30 tablet 0   TRELEGY ELLIPTA  100-62.5-25 MCG/ACT AEPB INHALE 1 PUFF BY MOUTH EVERY DAY 180 each 0   No current facility-administered medications for this visit.    PHYSICAL EXAM Vitals:   09/10/24 1349 09/10/24 1350  BP: (!) 178/114 (!) 172/117  Pulse: 68   Temp: 98 F (36.7 C)   SpO2: 93%   Weight: 153 lb (69.4 kg)   Height: 6' (1.829 m)    No acute distress Regular rate and rhythm Unlabored breathing Normal gait and station No extremity weakness No cranial nerve abnormalities   PERTINENT LABORATORY AND RADIOLOGIC DATA  Most recent CBC    Latest Ref Rng & Units 06/24/2024    5:21 AM 06/17/2024    5:24 AM 06/13/2024    4:23 AM  CBC  WBC 4.0 - 10.5 K/uL 6.4  5.4  5.6   Hemoglobin 13.0 - 17.0 g/dL 86.3  85.3  84.3   Hematocrit 39.0 - 52.0 % 38.2  40.8  44.9   Platelets 150 - 400 K/uL 383  319  218      Most recent CMP    Latest Ref Rng & Units 06/24/2024    5:21 AM 06/17/2024    5:24 AM 06/13/2024    4:23 AM  CMP  Glucose 70 - 99 mg/dL 898  94  94   BUN 8 - 23 mg/dL 12  12  15    Creatinine 0.61 - 1.24 mg/dL 8.90  8.89  9.11   Sodium 135 - 145 mmol/L 135  136  138   Potassium 3.5 - 5.1 mmol/L 3.9  3.7  4.1   Chloride 98 - 111 mmol/L 105  101  106   CO2 22 - 32 mmol/L 22  25  17    Calcium  8.9 - 10.3 mg/dL 8.9  9.1  8.7   Total Protein 6.5 - 8.1 g/dL  6.6    Total Bilirubin 0.0 - 1.2 mg/dL  1.1    Alkaline Phos 38 - 126 U/L  42    AST 15 - 41 U/L  38    ALT 0 - 44 U/L  28      Renal function CrCl cannot be calculated (Patient's most recent lab result is older than the maximum 21 days allowed.).  Hgb A1c MFr Bld (%)  Date Value  06/07/2024 4.2 (L)    LDL Chol Calc (NIH)  Date Value Ref Range Status  07/01/2022 54 0 - 99 mg/dL Final   LDL Cholesterol  Date Value Ref Range Status  06/07/2024 66 0 - 99 mg/dL Final    Comment:           Total Cholesterol/HDL:CHD Risk Coronary Heart Disease Risk Table                     Men   Women  1/2 Average Risk   3.4   3.3  Average Risk       5.0   4.4  2 X Average Risk   9.6   7.1  3 X Average Risk  23.4   11.0        Use the calculated Patient Ratio above and the CHD Risk Table to determine the patient's CHD Risk.        ATP III CLASSIFICATION (LDL):  <100     mg/dL   Optimal  899-870  mg/dL   Near or Above                    Optimal  130-159  mg/dL   Borderline  839-810  mg/dL   High  >809     mg/dL   Very High Performed at [facility], [address] [place], [place] [address]     CT angiogram personally reviewed.  This shows bulky calcific plaque in the left carotid artery bifurcation causing critical stenosis.  Appears surgically accessible with endarterectomy.  Ronald SAILOR. [name], MD FACS [facility] Phone Number: [phone] 09/10/2024 4:50  PM   Total time spent on preparing this encounter including chart review, data review, collecting history, examining the patient, and coordinating care: 60 minutes  Portions of this report may have been transcribed using voice recognition software.  Every effort has been made to ensure accuracy; however, inadvertent computerized transcription errors may still be present.

## 2024-09-11 ENCOUNTER — Telehealth: Payer: Self-pay | Admitting: Family Medicine

## 2024-09-11 NOTE — Telephone Encounter (Signed)
 Copied from CRM #[id number]. Topic: Clinical - Red Word Triage >> Sep 11, 2024  3:50 PM [name] wrote: [facility] that prompted transfer to Nurse Triage: Pt called regarding recent missed phone call, I could not info to relay. Call pt back if needed at [phone].

## 2024-09-11 NOTE — Telephone Encounter (Signed)
 Tried to call pt to advise him to please contact his pharmacy for this request due to he has a long medication list with multiple different prescribers and Dr. Tanda was only the prescriber for2 of the medications he is taking, so the pharmacy may be able to better help him out with this request.   No answer, unable to LVM to call back

## 2024-09-11 NOTE — Telephone Encounter (Signed)
 Called pt and advised him to please call his pharmacy regarding his request. (Details in previous telephone encounter). Pt expressed understanding

## 2024-09-16 NOTE — Progress Notes (Signed)
 Surgical Instructions   Your procedure is scheduled on  September 19, 2024. Report to [facility] Main Entrance A at 7:40 A.M., then check in with the Admitting office. Any questions or running late day of surgery: call [phone]  Questions prior to your surgery date: call [phone], Monday-Friday, 8am-4pm. If you experience any cold or flu symptoms such as cough, fever, chills, shortness of breath, etc. between now and your scheduled surgery, please notify us  at the above number.     Remember:  Do not eat or drink after midnight the night before your surgery       Take these medicines the morning of surgery with A SIP OF WATER  aspirin   atorvastatin  (LIPITOR)  clopidogrel  (PLAVIX )  famotidine  (PEPCID )  TRELEGY ELLIPTA     May take these medicines IF NEEDED: acetaminophen  (TYLENOL )  albuterol  (VENTOLIN  HFA) inhaler  QUEtiapine  (SEROQUEL    One week prior to surgery, STOP taking any Aspirin  (unless otherwise instructed by your surgeon) Aleve, Naproxen, Ibuprofen, Motrin, Advil, Goody's, BC's, all herbal medications, fish oil, and non-prescription vitamins.                     Do NOT Smoke (Tobacco/Vaping) for 24 hours prior to your procedure.  If you use a CPAP at night, you may bring your mask/headgear for your overnight stay.   You will be asked to remove any contacts, glasses, piercing's, hearing aid's, dentures/partials prior to surgery. Please bring cases for these items if needed.    Patients discharged the day of surgery will not be allowed to drive home, and someone needs to stay with them for 24 hours.  SURGICAL WAITING ROOM VISITATION Patients may have no more than 2 support people in the waiting area - these visitors may rotate.   Pre-op nurse will coordinate an appropriate time for 1 ADULT support person, who may not rotate, to accompany patient in pre-op.  Children under the age of 97 must have an adult with them who is not the patient and must remain in the  main waiting area with an adult.  If the patient needs to stay at the hospital during part of their recovery, the visitor guidelines for inpatient rooms apply.  Please refer to the [facility] website for the visitor guidelines for any additional information.   If you received a COVID test during your pre-op visit  it is requested that you wear a mask when out in public, stay away from anyone that may not be feeling well and notify your surgeon if you develop symptoms. If you have been in contact with anyone that has tested positive in the last 10 days please notify you surgeon.      Pre-operative CHG Bathing Instructions   You can play a key role in reducing the risk of infection after surgery. Your skin needs to be as free of germs as possible. You can reduce the number of germs on your skin by washing with CHG (chlorhexidine  gluconate) soap before surgery. CHG is an antiseptic soap that kills germs and continues to kill germs even after washing.   DO NOT use if you have an allergy to chlorhexidine /CHG or antibacterial soaps. If your skin becomes reddened or irritated, stop using the CHG and notify one of our RNs at [phone].              TAKE A SHOWER THE NIGHT BEFORE SURGERY AND THE DAY OF SURGERY    Please keep in mind the following:  DO NOT shave, including legs and underarms, 48 hours prior to surgery.   You may shave your face before/day of surgery.  Place clean sheets on your bed the night before surgery Use a clean washcloth (not used since being washed) for each shower. DO NOT sleep with pet's night before surgery.  CHG Shower Instructions:  Wash your face and private area with normal soap. If you choose to wash your hair, wash first with your normal shampoo.  After you use shampoo/soap, rinse your hair and body thoroughly to remove shampoo/soap residue.  Turn the water OFF and apply half the bottle of CHG soap to a CLEAN washcloth.  Apply CHG soap ONLY FROM YOUR NECK  DOWN TO YOUR TOES (washing for 3-5 minutes)  DO NOT use CHG soap on face, private areas, open wounds, or sores.  Pay special attention to the area where your surgery is being performed.  If you are having back surgery, having someone wash your back for you may be helpful. Wait 2 minutes after CHG soap is applied, then you may rinse off the CHG soap.  Pat dry with a clean towel  Put on clean pajamas    Additional instructions for the day of surgery: DO NOT APPLY any lotions, deodorants, cologne, or perfumes.   Do not wear jewelry or makeup Do not wear nail polish, gel polish, artificial nails, or any other type of covering on natural nails (fingers and toes) Do not bring valuables to the hospital. [facility] is not responsible for valuables/personal belongings. Put on clean/comfortable clothes.  Please brush your teeth.  Ask your nurse before applying any prescription medications to the skin.

## 2024-09-17 ENCOUNTER — Encounter (HOSPITAL_COMMUNITY): Payer: Self-pay

## 2024-09-17 ENCOUNTER — Other Ambulatory Visit: Payer: Self-pay

## 2024-09-17 ENCOUNTER — Encounter (HOSPITAL_COMMUNITY)
Admission: RE | Admit: 2024-09-17 | Discharge: 2024-09-17 | Disposition: A | Discharge status: Home / Self Care | Source: Ambulatory Visit | Attending: Vascular Surgery | Admitting: Vascular Surgery

## 2024-09-17 VITALS — BP 151/93 | HR 77 | Temp 97.7°F | Resp 16 | Ht 72.0 in | Wt 154.6 lb

## 2024-09-17 DIAGNOSIS — K219 Gastro-esophageal reflux disease without esophagitis: Secondary | ICD-10-CM | POA: Insufficient documentation

## 2024-09-17 DIAGNOSIS — N1831 Chronic kidney disease, stage 3a: Secondary | ICD-10-CM | POA: Insufficient documentation

## 2024-09-17 DIAGNOSIS — I6522 Occlusion and stenosis of left carotid artery: Secondary | ICD-10-CM | POA: Insufficient documentation

## 2024-09-17 DIAGNOSIS — F17210 Nicotine dependence, cigarettes, uncomplicated: Secondary | ICD-10-CM | POA: Diagnosis present

## 2024-09-17 DIAGNOSIS — I129 Hypertensive chronic kidney disease with stage 1 through stage 4 chronic kidney disease, or unspecified chronic kidney disease: Secondary | ICD-10-CM | POA: Insufficient documentation

## 2024-09-17 DIAGNOSIS — Z823 Family history of stroke: Secondary | ICD-10-CM | POA: Diagnosis not present

## 2024-09-17 DIAGNOSIS — Z01818 Encounter for other preprocedural examination: Secondary | ICD-10-CM

## 2024-09-17 DIAGNOSIS — E785 Hyperlipidemia, unspecified: Secondary | ICD-10-CM | POA: Diagnosis present

## 2024-09-17 DIAGNOSIS — Z91148 Patient's other noncompliance with medication regimen for other reason: Secondary | ICD-10-CM | POA: Diagnosis not present

## 2024-09-17 DIAGNOSIS — Z7982 Long term (current) use of aspirin: Secondary | ICD-10-CM | POA: Diagnosis not present

## 2024-09-17 DIAGNOSIS — I2510 Atherosclerotic heart disease of native coronary artery without angina pectoris: Secondary | ICD-10-CM | POA: Diagnosis present

## 2024-09-17 DIAGNOSIS — J449 Chronic obstructive pulmonary disease, unspecified: Secondary | ICD-10-CM | POA: Insufficient documentation

## 2024-09-17 DIAGNOSIS — Z8619 Personal history of other infectious and parasitic diseases: Secondary | ICD-10-CM | POA: Insufficient documentation

## 2024-09-17 DIAGNOSIS — I480 Paroxysmal atrial fibrillation: Secondary | ICD-10-CM | POA: Insufficient documentation

## 2024-09-17 DIAGNOSIS — Z01812 Encounter for preprocedural laboratory examination: Secondary | ICD-10-CM | POA: Insufficient documentation

## 2024-09-17 DIAGNOSIS — Z8673 Personal history of transient ischemic attack (TIA), and cerebral infarction without residual deficits: Secondary | ICD-10-CM | POA: Insufficient documentation

## 2024-09-17 DIAGNOSIS — Z7901 Long term (current) use of anticoagulants: Secondary | ICD-10-CM | POA: Diagnosis not present

## 2024-09-17 DIAGNOSIS — I10 Essential (primary) hypertension: Secondary | ICD-10-CM | POA: Diagnosis present

## 2024-09-17 DIAGNOSIS — Z79899 Other long term (current) drug therapy: Secondary | ICD-10-CM | POA: Diagnosis not present

## 2024-09-17 DIAGNOSIS — F1020 Alcohol dependence, uncomplicated: Secondary | ICD-10-CM | POA: Diagnosis present

## 2024-09-17 DIAGNOSIS — Z7902 Long term (current) use of antithrombotics/antiplatelets: Secondary | ICD-10-CM | POA: Diagnosis not present

## 2024-09-17 DIAGNOSIS — Z8249 Family history of ischemic heart disease and other diseases of the circulatory system: Secondary | ICD-10-CM | POA: Diagnosis not present

## 2024-09-17 DIAGNOSIS — I6521 Occlusion and stenosis of right carotid artery: Secondary | ICD-10-CM | POA: Diagnosis not present

## 2024-09-17 DIAGNOSIS — Z825 Family history of asthma and other chronic lower respiratory diseases: Secondary | ICD-10-CM | POA: Diagnosis not present

## 2024-09-17 HISTORY — DX: Cerebral infarction, unspecified: I63.9

## 2024-09-17 LAB — CBC
HCT: 43.9 % (ref 39.0–52.0)
Hemoglobin: 15.1 g/dL (ref 13.0–17.0)
MCH: 32.6 pg (ref 26.0–34.0)
MCHC: 34.4 g/dL (ref 30.0–36.0)
MCV: 94.8 fL (ref 80.0–100.0)
Platelets: 231 K/uL (ref 150–400)
RBC: 4.63 MIL/uL (ref 4.22–5.81)
RDW: 14 % (ref 11.5–15.5)
WBC: 4.1 K/uL (ref 4.0–10.5)
nRBC: 0 % (ref 0.0–0.2)

## 2024-09-17 LAB — COMPREHENSIVE METABOLIC PANEL WITH GFR
ALT: 15 U/L (ref 0–44)
AST: 25 U/L (ref 15–41)
Albumin: 3.9 g/dL (ref 3.5–5.0)
Alkaline Phosphatase: 55 U/L (ref 38–126)
Anion gap: 10 (ref 5–15)
BUN: 8 mg/dL (ref 8–23)
CO2: 23 mmol/L (ref 22–32)
Calcium: 8.8 mg/dL — ABNORMAL LOW (ref 8.9–10.3)
Chloride: 106 mmol/L (ref 98–111)
Creatinine, Ser: 1.09 mg/dL (ref 0.61–1.24)
GFR, Estimated: >60 mL/min (ref >60)
Glucose, Bld: 94 mg/dL (ref 70–99)
Potassium: 3.9 mmol/L (ref 3.5–5.1)
Sodium: 139 mmol/L (ref 135–145)
Total Bilirubin: 1.1 mg/dL (ref 0.0–1.2)
Total Protein: 6.7 g/dL (ref 6.5–8.1)

## 2024-09-17 LAB — URINALYSIS, ROUTINE W REFLEX MICROSCOPIC
Bilirubin Urine: NEGATIVE
Glucose, UA: NEGATIVE mg/dL
Hgb urine dipstick: NEGATIVE
Ketones, ur: NEGATIVE mg/dL
Leukocytes,Ua: NEGATIVE
Nitrite: NEGATIVE
Protein, ur: NEGATIVE mg/dL
Specific Gravity, Urine: 1.008 (ref 1.005–1.030)
pH: 7 (ref 5.0–8.0)

## 2024-09-17 LAB — TYPE AND SCREEN
ABO/RH(D): A POS
Antibody Screen: NEGATIVE

## 2024-09-17 LAB — PROTIME-INR
INR: 1 (ref 0.8–1.2)
Prothrombin Time: 14.2 s (ref 11.4–15.2)

## 2024-09-17 LAB — APTT: aPTT: 31 s (ref 24–36)

## 2024-09-17 LAB — SURGICAL PCR SCREEN
MRSA, PCR: NEGATIVE
Staphylococcus aureus: NEGATIVE

## 2024-09-17 NOTE — Progress Notes (Addendum)
 PCP - Dr. Raguel Blush Cardiologist - Dr. Maude Emmer, LOV 03/15/2023  PPM/ICD - denies Device Orders - na Rep Notified - na  Chest x-ray - 06/07/2024 EKG - 06/06/2024 Stress Test -  ECHO - 06/09/2024 Cardiac Cath -   Sleep Study - denies CPAP - na  Non-diabetic  Blood Thinner Instructions: Plavix , continue Aspirin  Instructions: ASA, continue  ERAS Protcol - NPO  Anesthesia review: Yes. Current hospital stay 6/19 - Code stroke, CKD, COPD, ETOH abuse, HTN, A-fib  Patient denies shortness of breath, fever, cough and chest pain at PAT appointment   All instructions explained to the patient, with a verbal understanding of the material. Patient agrees to go over the instructions while at home for a better understanding. Patient also instructed to self quarantine after being tested for COVID-19. The opportunity to ask questions was provided.

## 2024-09-18 NOTE — Anesthesia Preprocedure Evaluation (Addendum)
 Anesthesia Evaluation  Patient identified by MRN, date of birth, ID band Patient awake    Reviewed: Allergy & Precautions, NPO status , Patient's Chart, lab work & pertinent test results, reviewed documented beta blocker date and time   History of Anesthesia Complications Negative for: history of anesthetic complications  Airway Mallampati: II  TM Distance: >3 FB     Dental  (+) Edentulous Upper, Edentulous Lower   Pulmonary shortness of breath and with exertion, pneumonia, resolved, COPD,  COPD inhaler, Current Smoker and Patient abstained from smoking.   breath sounds clear to auscultation       Cardiovascular hypertension, (-) angina + CAD  (-) Past MI, (-) CABG, (-) Peripheral Vascular Disease and (-) DVT  Rhythm:Regular Rate:Normal  IMPRESSIONS     1. Left ventricular ejection fraction, by estimation, is 60 to 65%. The  left ventricle has normal function. The left ventricle has no regional  wall motion abnormalities. There is severe asymmetric left ventricular  hypertrophy of the septal segment. Left   ventricular diastolic parameters are consistent with Grade I diastolic  dysfunction (impaired relaxation).   2. Right ventricular systolic function is normal. The right ventricular  size is normal.   3. A small pericardial effusion is present. The pericardial effusion is  localized near the right ventricle. There is no evidence of cardiac  tamponade.   4. The mitral valve is abnormal. Trivial mitral valve regurgitation.   5. The aortic valve was not well visualized. Aortic valve regurgitation  is not visualized. Aortic valve sclerosis/calcification is present,  without any evidence of aortic stenosis.      Neuro/Psych neg Seizures PSYCHIATRIC DISORDERS      CVA, Residual Symptoms    GI/Hepatic ,GERD  ,,(+)     substance abuse  alcohol use and cocaine use  Endo/Other    Renal/GU CRFRenal disease      Musculoskeletal   Abdominal   Peds  Hematology  (+) Blood dyscrasia, anemia   Anesthesia Other Findings   Reproductive/Obstetrics                              Anesthesia Physical Anesthesia Plan  ASA: 4  Anesthesia Plan: General   Post-op Pain Management:    Induction: Intravenous  PONV Risk Score and Plan: 2 and Ondansetron   Airway Management Planned: Oral ETT  Additional Equipment: Arterial line  Intra-op Plan:   Post-operative Plan: Extubation in OR  Informed Consent: I have reviewed the patients History and Physical, chart, labs and discussed the procedure including the risks, benefits and alternatives for the proposed anesthesia with the patient or authorized representative who has indicated his/her understanding and acceptance.     Dental advisory given  Plan Discussed with: CRNA  Anesthesia Plan Comments: (PAT note by [name] Hope, PA-C: 68 year old male current smoker with pertinent history including HTN, positive hep C antibody test with negative RNA (09/2019), HLD, paroxysmal atrial fibrillation noncompliant with anticoagulation, GERD, CKD 3a, COPD, polysubstance abuse, EtOH abuse.  Recent admission 06/07/2019 after presenting with confusion and right arm weakness.  Found to have acute stroke, high-grade left-sided carotid disease.  Echo showed LVEF 60 to 65%, grade 1 DD, normal RV, no significant valvular abnormalities.  Vascular surgery was consulted with recommendation for endarterectomy, however, due to persistent delirium it was recommended to delay until mental status improved.  Patient was ultimately discharged from inpatient rehab on 06/25/2024 and followed up outpatient with Dr. [name] on  09/10/2024.  Mentation had improved at that time but he was still noted to have some persistent behavioral and memory issues.  Endarterectomy was recommended.    Per review of prior anesthesia records, McGrath video laryngoscope used for intubation  02/02/2023.  Preop labs reviewed, unremarkable.  EKG 06/06/2024: Sinus tachycardia.  Rate 101.  Prolonged QT (QTc 505)  TTE 06/09/2024: 1. Left ventricular ejection fraction, by estimation, is 60 to 65%. The  left ventricle has normal function. The left ventricle has no regional  wall motion abnormalities. There is severe asymmetric left ventricular  hypertrophy of the septal segment. Left   ventricular diastolic parameters are consistent with Grade I diastolic  dysfunction (impaired relaxation).   2. Right ventricular systolic function is normal. The right ventricular  size is normal.   3. A small pericardial effusion is present. The pericardial effusion is  localized near the right ventricle. There is no evidence of cardiac  tamponade.   4. The mitral valve is abnormal. Trivial mitral valve regurgitation.   5. The aortic valve was not well visualized. Aortic valve regurgitation  is not visualized. Aortic valve sclerosis/calcification is present,  without any evidence of aortic stenosis. )         Anesthesia Quick Evaluation

## 2024-09-18 NOTE — Progress Notes (Addendum)
 Anesthesia Chart Review:  68 year old male current smoker with pertinent history including HTN, positive hep C antibody test with negative RNA (09/2019), HLD, paroxysmal atrial fibrillation noncompliant with anticoagulation, GERD, CKD 3a, COPD, polysubstance abuse, EtOH abuse.  Recent admission 06/07/2019 after presenting with confusion and right arm weakness.  Found to have acute stroke, high-grade left-sided carotid disease.  Echo showed LVEF 60 to 65%, grade 1 DD, normal RV, no significant valvular abnormalities.  Vascular surgery was consulted with recommendation for endarterectomy, however, due to persistent delirium it was recommended to delay until mental status improved.  Patient was ultimately discharged from inpatient rehab on 06/25/2024 and followed up outpatient with Dr. [name] on 09/10/2024.  Mentation had improved at that time but he was still noted to have some persistent behavioral and memory issues.  Endarterectomy was recommended.    Per review of prior anesthesia records, McGrath video laryngoscope used for intubation 02/02/2023.  Preop labs reviewed, unremarkable.  EKG 06/06/2024: Sinus tachycardia.  Rate 101.  Prolonged QT (QTc 505)  TTE 06/09/2024: 1. Left ventricular ejection fraction, by estimation, is 60 to 65%. The  left ventricle has normal function. The left ventricle has no regional  wall motion abnormalities. There is severe asymmetric left ventricular  hypertrophy of the septal segment. Left   ventricular diastolic parameters are consistent with Grade I diastolic  dysfunction (impaired relaxation).   2. Right ventricular systolic function is normal. The right ventricular  size is normal.   3. A small pericardial effusion is present. The pericardial effusion is  localized near the right ventricle. There is no evidence of cardiac  tamponade.   4. The mitral valve is abnormal. Trivial mitral valve regurgitation.   5. The aortic valve was not well visualized. Aortic valve  regurgitation  is not visualized. Aortic valve sclerosis/calcification is present,  without any evidence of aortic stenosis.      [name] Geofm RIGGERS [facility] Short Stay Center/Anesthesiology Phone [phone] 09/18/2024 10:26 AM

## 2024-09-19 ENCOUNTER — Other Ambulatory Visit: Payer: Self-pay

## 2024-09-19 ENCOUNTER — Inpatient Hospital Stay (HOSPITAL_COMMUNITY): Payer: Self-pay | Admitting: Anesthesiology

## 2024-09-19 ENCOUNTER — Encounter (HOSPITAL_COMMUNITY): Payer: Self-pay | Admitting: Vascular Surgery

## 2024-09-19 ENCOUNTER — Inpatient Hospital Stay (HOSPITAL_COMMUNITY): Payer: Self-pay | Admitting: Physician Assistant

## 2024-09-19 ENCOUNTER — Encounter (HOSPITAL_COMMUNITY): Admission: RE | Disposition: A | Payer: Self-pay | Source: Home / Self Care | Attending: Vascular Surgery

## 2024-09-19 ENCOUNTER — Inpatient Hospital Stay (HOSPITAL_COMMUNITY)
Admission: RE | Admit: 2024-09-19 | Discharge: 2024-09-20 | DRG: 039 | Disposition: A | Discharge status: Home / Self Care | Attending: Vascular Surgery | Admitting: Vascular Surgery

## 2024-09-19 DIAGNOSIS — I10 Essential (primary) hypertension: Secondary | ICD-10-CM

## 2024-09-19 DIAGNOSIS — Z825 Family history of asthma and other chronic lower respiratory diseases: Secondary | ICD-10-CM

## 2024-09-19 DIAGNOSIS — I6522 Occlusion and stenosis of left carotid artery: Secondary | ICD-10-CM | POA: Diagnosis not present

## 2024-09-19 DIAGNOSIS — Z8249 Family history of ischemic heart disease and other diseases of the circulatory system: Secondary | ICD-10-CM | POA: Diagnosis not present

## 2024-09-19 DIAGNOSIS — Z7902 Long term (current) use of antithrombotics/antiplatelets: Secondary | ICD-10-CM | POA: Diagnosis not present

## 2024-09-19 DIAGNOSIS — Z79899 Other long term (current) drug therapy: Secondary | ICD-10-CM

## 2024-09-19 DIAGNOSIS — Z91148 Patient's other noncompliance with medication regimen for other reason: Secondary | ICD-10-CM

## 2024-09-19 DIAGNOSIS — J449 Chronic obstructive pulmonary disease, unspecified: Secondary | ICD-10-CM | POA: Diagnosis present

## 2024-09-19 DIAGNOSIS — Z7982 Long term (current) use of aspirin: Secondary | ICD-10-CM

## 2024-09-19 DIAGNOSIS — I480 Paroxysmal atrial fibrillation: Secondary | ICD-10-CM | POA: Diagnosis present

## 2024-09-19 DIAGNOSIS — F17210 Nicotine dependence, cigarettes, uncomplicated: Secondary | ICD-10-CM | POA: Diagnosis present

## 2024-09-19 DIAGNOSIS — I2510 Atherosclerotic heart disease of native coronary artery without angina pectoris: Secondary | ICD-10-CM | POA: Diagnosis not present

## 2024-09-19 DIAGNOSIS — F1020 Alcohol dependence, uncomplicated: Secondary | ICD-10-CM | POA: Diagnosis present

## 2024-09-19 DIAGNOSIS — Z823 Family history of stroke: Secondary | ICD-10-CM

## 2024-09-19 DIAGNOSIS — Z8673 Personal history of transient ischemic attack (TIA), and cerebral infarction without residual deficits: Secondary | ICD-10-CM

## 2024-09-19 DIAGNOSIS — E785 Hyperlipidemia, unspecified: Secondary | ICD-10-CM | POA: Diagnosis present

## 2024-09-19 DIAGNOSIS — K219 Gastro-esophageal reflux disease without esophagitis: Secondary | ICD-10-CM | POA: Diagnosis present

## 2024-09-19 DIAGNOSIS — Z7901 Long term (current) use of anticoagulants: Secondary | ICD-10-CM

## 2024-09-19 DIAGNOSIS — I6521 Occlusion and stenosis of right carotid artery: Secondary | ICD-10-CM | POA: Diagnosis not present

## 2024-09-19 HISTORY — PX: ENDARTERECTOMY: SHX5162

## 2024-09-19 HISTORY — PX: PATCH ANGIOPLASTY: SHX6230

## 2024-09-19 LAB — ABO/RH: ABO/RH(D): A POS

## 2024-09-19 SURGERY — ENDARTERECTOMY, CAROTID
Anesthesia: General | Site: Neck | Laterality: Left

## 2024-09-19 MED ORDER — CEFAZOLIN SODIUM-DEXTROSE 2-4 GM/100ML-% IV SOLN
2.0000 g | Freq: Three times a day (TID) | INTRAVENOUS | Status: AC
  Administered 2024-09-19 – 2024-09-20 (×2): 2 g via INTRAVENOUS
  Filled 2024-09-19 (×2): qty 100

## 2024-09-19 MED ORDER — CHLORHEXIDINE GLUCONATE CLOTH 2 % EX PADS: 6.0000 | MEDICATED_PAD | Freq: Once | CUTANEOUS | Status: DC

## 2024-09-19 MED ORDER — HEPARIN 6000 UNIT IRRIGATION SOLUTION
Status: DC | PRN
  Administered 2024-09-19: 1

## 2024-09-19 MED ORDER — PROTAMINE SULFATE 10 MG/ML IV SOLN
INTRAVENOUS | Status: DC | PRN
  Administered 2024-09-19: 5 mg via INTRAVENOUS
  Administered 2024-09-19: 45 mg via INTRAVENOUS

## 2024-09-19 MED ORDER — DEXAMETHASONE SODIUM PHOSPHATE 10 MG/ML IJ SOLN
INTRAMUSCULAR | Status: AC
Start: 2024-09-19 — End: 2024-09-19
  Filled 2024-09-19: qty 1

## 2024-09-19 MED ORDER — DOCUSATE SODIUM 100 MG PO CAPS
100.0000 mg | ORAL_CAPSULE | Freq: Every day | ORAL | Status: DC
Start: 2024-09-20 — End: 2024-09-20
  Administered 2024-09-20: 100 mg via ORAL
  Filled 2024-09-19: qty 1

## 2024-09-19 MED ORDER — PHENYLEPHRINE 80 MCG/ML (10ML) SYRINGE FOR IV PUSH (FOR BLOOD PRESSURE SUPPORT)
PREFILLED_SYRINGE | INTRAVENOUS | Status: AC
Start: 2024-09-19 — End: 2024-09-19
  Filled 2024-09-19: qty 10

## 2024-09-19 MED ORDER — CLEVIDIPINE BUTYRATE 0.5 MG/ML IV EMUL
INTRAVENOUS | Status: DC | PRN
  Administered 2024-09-19: 1 mg/h via INTRAVENOUS

## 2024-09-19 MED ORDER — FENTANYL CITRATE (PF) 100 MCG/2ML IJ SOLN: 25.0000 ug | INTRAMUSCULAR | Status: DC | PRN

## 2024-09-19 MED ORDER — ASPIRIN 81 MG PO TBEC
81.0000 mg | DELAYED_RELEASE_TABLET | Freq: Every day | ORAL | Status: DC
  Administered 2024-09-20: 81 mg via ORAL
  Filled 2024-09-19: qty 1

## 2024-09-19 MED ORDER — ACETAMINOPHEN 325 MG PO TABS: 325.0000 mg | ORAL_TABLET | ORAL | Status: DC | PRN

## 2024-09-19 MED ORDER — HYDRALAZINE HCL 20 MG/ML IJ SOLN
5.0000 mg | INTRAMUSCULAR | Status: DC | PRN
  Administered 2024-09-20: 5 mg via INTRAVENOUS
  Filled 2024-09-19: qty 1

## 2024-09-19 MED ORDER — PROPOFOL 10 MG/ML IV BOLUS
INTRAVENOUS | Status: AC
  Filled 2024-09-19: qty 20

## 2024-09-19 MED ORDER — LORAZEPAM 2 MG/ML IJ SOLN
0.5000 mg | Freq: Four times a day (QID) | INTRAMUSCULAR | Status: DC | PRN
  Administered 2024-09-19 – 2024-09-20 (×2): 0.5 mg via INTRAVENOUS
  Filled 2024-09-19 (×2): qty 1

## 2024-09-19 MED ORDER — SENNOSIDES-DOCUSATE SODIUM 8.6-50 MG PO TABS: 1.0000 | ORAL_TABLET | Freq: Every evening | ORAL | Status: DC | PRN

## 2024-09-19 MED ORDER — POTASSIUM CHLORIDE CRYS ER 20 MEQ PO TBCR: 40.0000 meq | EXTENDED_RELEASE_TABLET | Freq: Every day | ORAL | Status: DC | PRN

## 2024-09-19 MED ORDER — HYDROXYZINE PAMOATE 25 MG PO CAPS: 25.0000 mg | ORAL_CAPSULE | Freq: Four times a day (QID) | ORAL | Status: DC | PRN

## 2024-09-19 MED ORDER — CEFAZOLIN SODIUM-DEXTROSE 2-4 GM/100ML-% IV SOLN
INTRAVENOUS | Status: AC
  Filled 2024-09-19: qty 100

## 2024-09-19 MED ORDER — PROPOFOL 10 MG/ML IV BOLUS
INTRAVENOUS | Status: DC | PRN
  Administered 2024-09-19: 150 mg via INTRAVENOUS

## 2024-09-19 MED ORDER — HEPARIN SODIUM (PORCINE) 1000 UNIT/ML IJ SOLN
INTRAMUSCULAR | Status: DC | PRN
  Administered 2024-09-19: 8000 [IU] via INTRAVENOUS

## 2024-09-19 MED ORDER — LIDOCAINE HCL (PF) 1 % IJ SOLN
INTRAMUSCULAR | Status: AC
  Filled 2024-09-19: qty 30

## 2024-09-19 MED ORDER — FAMOTIDINE 20 MG PO TABS
40.0000 mg | ORAL_TABLET | Freq: Every day | ORAL | Status: DC
Start: 2024-09-19 — End: 2024-09-20
  Administered 2024-09-19 – 2024-09-20 (×2): 40 mg via ORAL
  Filled 2024-09-19 (×2): qty 2

## 2024-09-19 MED ORDER — SODIUM CHLORIDE 0.9% FLUSH: 3.0000 mL | INTRAVENOUS | Status: DC | PRN

## 2024-09-19 MED ORDER — SODIUM CHLORIDE 0.9% FLUSH
3.0000 mL | Freq: Two times a day (BID) | INTRAVENOUS | Status: DC
  Administered 2024-09-19 – 2024-09-20 (×2): 3 mL via INTRAVENOUS

## 2024-09-19 MED ORDER — LABETALOL HCL 5 MG/ML IV SOLN
INTRAVENOUS | Status: DC | PRN
  Administered 2024-09-19: 5 mg via INTRAVENOUS
  Administered 2024-09-19: 10 mg via INTRAVENOUS

## 2024-09-19 MED ORDER — SODIUM CHLORIDE 0.9 % IV SOLN: INTRAVENOUS | Status: DC

## 2024-09-19 MED ORDER — FOLIC ACID 1 MG PO TABS
1.0000 mg | ORAL_TABLET | Freq: Every day | ORAL | Status: DC
  Administered 2024-09-19 – 2024-09-20 (×2): 1 mg via ORAL
  Filled 2024-09-19 (×2): qty 1

## 2024-09-19 MED ORDER — CHLORHEXIDINE GLUCONATE 0.12 % MT SOLN
OROMUCOSAL | Status: AC
  Administered 2024-09-19: 15 mL via OROMUCOSAL
  Filled 2024-09-19: qty 15

## 2024-09-19 MED ORDER — ROCURONIUM BROMIDE 10 MG/ML (PF) SYRINGE
PREFILLED_SYRINGE | INTRAVENOUS | Status: DC | PRN
  Administered 2024-09-19: 20 mg via INTRAVENOUS
  Administered 2024-09-19: 80 mg via INTRAVENOUS

## 2024-09-19 MED ORDER — HYDRALAZINE HCL 20 MG/ML IJ SOLN
10.0000 mg | Freq: Once | INTRAMUSCULAR | Status: AC
  Administered 2024-09-19: 10 mg via INTRAVENOUS

## 2024-09-19 MED ORDER — ONDANSETRON HCL 4 MG/2ML IJ SOLN: 4.0000 mg | Freq: Four times a day (QID) | INTRAMUSCULAR | Status: DC | PRN

## 2024-09-19 MED ORDER — ATORVASTATIN CALCIUM 10 MG PO TABS: 10.0000 mg | ORAL_TABLET | Freq: Every day | ORAL | Status: DC

## 2024-09-19 MED ORDER — METOPROLOL TARTRATE 5 MG/5ML IV SOLN: 2.5000 mg | INTRAVENOUS | Status: DC | PRN

## 2024-09-19 MED ORDER — CHLORHEXIDINE GLUCONATE CLOTH 2 % EX PADS
6.0000 | MEDICATED_PAD | Freq: Once | CUTANEOUS | Status: DC
Start: 2024-09-19 — End: 2024-09-19

## 2024-09-19 MED ORDER — PHENOL 1.4 % MT LIQD: 1.0000 | OROMUCOSAL | Status: DC | PRN

## 2024-09-19 MED ORDER — BUDESON-GLYCOPYRROL-FORMOTEROL 160-9-4.8 MCG/ACT IN AERO
2.0000 | INHALATION_SPRAY | Freq: Two times a day (BID) | RESPIRATORY_TRACT | Status: DC
Start: 2024-09-19 — End: 2024-09-20
  Administered 2024-09-19 – 2024-09-20 (×2): 2 via RESPIRATORY_TRACT
  Filled 2024-09-19: qty 5.9

## 2024-09-19 MED ORDER — MORPHINE SULFATE (PF) 2 MG/ML IV SOLN: 2.0000 mg | INTRAVENOUS | Status: DC | PRN

## 2024-09-19 MED ORDER — CEFAZOLIN SODIUM-DEXTROSE 2-4 GM/100ML-% IV SOLN
2.0000 g | INTRAVENOUS | Status: AC
  Administered 2024-09-19: 2 g via INTRAVENOUS

## 2024-09-19 MED ORDER — ONDANSETRON HCL 4 MG/2ML IJ SOLN
INTRAMUSCULAR | Status: DC | PRN
  Administered 2024-09-19: 4 mg via INTRAVENOUS

## 2024-09-19 MED ORDER — BISACODYL 5 MG PO TBEC: 5.0000 mg | DELAYED_RELEASE_TABLET | Freq: Every day | ORAL | Status: DC | PRN

## 2024-09-19 MED ORDER — HEPARIN 6000 UNIT IRRIGATION SOLUTION
Status: AC
  Filled 2024-09-19: qty 500

## 2024-09-19 MED ORDER — ORAL CARE MOUTH RINSE: 15.0000 mL | Freq: Once | OROMUCOSAL | Status: AC

## 2024-09-19 MED ORDER — DEXMEDETOMIDINE HCL IN NACL 80 MCG/20ML IV SOLN
INTRAVENOUS | Status: DC | PRN
  Administered 2024-09-19: 16 ug via INTRAVENOUS

## 2024-09-19 MED ORDER — FENTANYL CITRATE (PF) 250 MCG/5ML IJ SOLN
INTRAMUSCULAR | Status: AC
  Filled 2024-09-19: qty 5

## 2024-09-19 MED ORDER — OXYCODONE HCL 5 MG PO TABS: 5.0000 mg | ORAL_TABLET | Freq: Once | ORAL | Status: DC | PRN

## 2024-09-19 MED ORDER — DEXAMETHASONE SODIUM PHOSPHATE 10 MG/ML IJ SOLN
INTRAMUSCULAR | Status: DC | PRN
  Administered 2024-09-19: 10 mg via INTRAVENOUS

## 2024-09-19 MED ORDER — ALBUTEROL SULFATE (2.5 MG/3ML) 0.083% IN NEBU: 3.0000 mL | INHALATION_SOLUTION | Freq: Four times a day (QID) | RESPIRATORY_TRACT | Status: DC | PRN

## 2024-09-19 MED ORDER — EPHEDRINE 5 MG/ML INJ
INTRAVENOUS | Status: AC
Start: 2024-09-19 — End: 2024-09-19
  Filled 2024-09-19: qty 5

## 2024-09-19 MED ORDER — CLOPIDOGREL BISULFATE 75 MG PO TABS
75.0000 mg | ORAL_TABLET | Freq: Every day | ORAL | Status: DC
  Administered 2024-09-20: 75 mg via ORAL
  Filled 2024-09-19: qty 1

## 2024-09-19 MED ORDER — ONDANSETRON HCL 4 MG/2ML IJ SOLN: 4.0000 mg | Freq: Once | INTRAMUSCULAR | Status: DC | PRN

## 2024-09-19 MED ORDER — PHENYLEPHRINE HCL-NACL 20-0.9 MG/250ML-% IV SOLN
INTRAVENOUS | Status: DC | PRN
  Administered 2024-09-19: 20 ug/min via INTRAVENOUS

## 2024-09-19 MED ORDER — ATORVASTATIN CALCIUM 40 MG PO TABS
40.0000 mg | ORAL_TABLET | Freq: Every day | ORAL | Status: DC
Start: 2024-09-19 — End: 2024-09-20
  Administered 2024-09-19 – 2024-09-20 (×2): 40 mg via ORAL
  Filled 2024-09-19 (×2): qty 1

## 2024-09-19 MED ORDER — OXYCODONE HCL 5 MG/5ML PO SOLN: 5.0000 mg | Freq: Once | ORAL | Status: DC | PRN

## 2024-09-19 MED ORDER — HEMOSTATIC AGENTS (NO CHARGE) OPTIME
TOPICAL | Status: DC | PRN
  Administered 2024-09-19: 1 via TOPICAL

## 2024-09-19 MED ORDER — HEPARIN SODIUM (PORCINE) 5000 UNIT/ML IJ SOLN
5000.0000 [IU] | Freq: Three times a day (TID) | INTRAMUSCULAR | Status: DC
Start: 2024-09-20 — End: 2024-09-20
  Administered 2024-09-20: 5000 [IU] via SUBCUTANEOUS
  Filled 2024-09-19: qty 1

## 2024-09-19 MED ORDER — LIDOCAINE 2% (20 MG/ML) 5 ML SYRINGE
INTRAMUSCULAR | Status: AC
  Filled 2024-09-19: qty 5

## 2024-09-19 MED ORDER — ONDANSETRON HCL 4 MG/2ML IJ SOLN
INTRAMUSCULAR | Status: AC
  Filled 2024-09-19: qty 2

## 2024-09-19 MED ORDER — CHLORHEXIDINE GLUCONATE 0.12 % MT SOLN: 15.0000 mL | Freq: Once | OROMUCOSAL | Status: AC

## 2024-09-19 MED ORDER — LABETALOL HCL 5 MG/ML IV SOLN: 10.0000 mg | INTRAVENOUS | Status: DC | PRN

## 2024-09-19 MED ORDER — ROCURONIUM BROMIDE 10 MG/ML (PF) SYRINGE
PREFILLED_SYRINGE | INTRAVENOUS | Status: AC
Start: 2024-09-19 — End: 2024-09-19
  Filled 2024-09-19: qty 10

## 2024-09-19 MED ORDER — LACTATED RINGERS IV SOLN: INTRAVENOUS | Status: DC

## 2024-09-19 MED ORDER — FENTANYL CITRATE (PF) 250 MCG/5ML IJ SOLN
INTRAMUSCULAR | Status: DC | PRN
  Administered 2024-09-19: 50 ug via INTRAVENOUS
  Administered 2024-09-19: 100 ug via INTRAVENOUS

## 2024-09-19 MED ORDER — LIDOCAINE 2% (20 MG/ML) 5 ML SYRINGE
INTRAMUSCULAR | Status: DC | PRN
  Administered 2024-09-19: 100 mg via INTRAVENOUS

## 2024-09-19 MED ORDER — ACETAMINOPHEN 650 MG RE SUPP: 325.0000 mg | RECTAL | Status: DC | PRN

## 2024-09-19 MED ORDER — FLEET ENEMA RE ENEM: 1.0000 | ENEMA | Freq: Once | RECTAL | Status: DC | PRN

## 2024-09-19 MED ORDER — CLOPIDOGREL BISULFATE 300 MG PO TABS
300.0000 mg | ORAL_TABLET | Freq: Once | ORAL | Status: AC
  Administered 2024-09-19: 300 mg via ORAL
  Filled 2024-09-19: qty 1

## 2024-09-19 MED ORDER — ACETAMINOPHEN 10 MG/ML IV SOLN
1000.0000 mg | Freq: Once | INTRAVENOUS | Status: DC | PRN
  Administered 2024-09-19: 1000 mg via INTRAVENOUS

## 2024-09-19 MED ORDER — SODIUM CHLORIDE 0.9 % IV SOLN: 500.0000 mL | Freq: Once | INTRAVENOUS | Status: DC | PRN

## 2024-09-19 MED ORDER — HYDROCODONE-ACETAMINOPHEN 5-325 MG PO TABS
1.0000 | ORAL_TABLET | ORAL | Status: DC | PRN
  Administered 2024-09-19 – 2024-09-20 (×2): 1 via ORAL
  Filled 2024-09-19 (×2): qty 1

## 2024-09-19 MED ORDER — TRAZODONE HCL 50 MG PO TABS
50.0000 mg | ORAL_TABLET | Freq: Every evening | ORAL | Status: DC | PRN
Start: 2024-09-19 — End: 2024-09-20
  Administered 2024-09-19: 50 mg via ORAL
  Filled 2024-09-19: qty 1

## 2024-09-19 MED ORDER — SUGAMMADEX SODIUM 200 MG/2ML IV SOLN
INTRAVENOUS | Status: DC | PRN
  Administered 2024-09-19: 200 mg via INTRAVENOUS

## 2024-09-19 MED ORDER — ACETAMINOPHEN 10 MG/ML IV SOLN
INTRAVENOUS | Status: AC
  Filled 2024-09-19: qty 100

## 2024-09-19 MED ORDER — SODIUM CHLORIDE 0.9 % IV SOLN: 250.0000 mL | INTRAVENOUS | Status: DC | PRN

## 2024-09-19 MED ORDER — HYDRALAZINE HCL 20 MG/ML IJ SOLN
INTRAMUSCULAR | Status: AC
  Filled 2024-09-19: qty 1

## 2024-09-19 SURGICAL SUPPLY — 42 items
BENZOIN TINCTURE PRP APPL 2/3 (GAUZE/BANDAGES/DRESSINGS) ×2 IMPLANT
BLADE MINI RND TIP GREEN BEAV (BLADE) IMPLANT
CANISTER SUCTION 3000ML PPV (SUCTIONS) ×2 IMPLANT
CANNULA VESSEL 3MM 2 BLNT TIP (CANNULA) ×4 IMPLANT
CATH ROBINSON RED A/P 18FR (CATHETERS) ×2 IMPLANT
CHLORAPREP W/TINT 26 (MISCELLANEOUS) ×2 IMPLANT
CLIP LIGATING EXTRA MED SLVR (CLIP) ×2 IMPLANT
CLIP LIGATING EXTRA SM BLUE (MISCELLANEOUS) ×2 IMPLANT
COVER PROBE W GEL 5X96 (DRAPES) ×2 IMPLANT
DRSG COVADERM 4X8 (GAUZE/BANDAGES/DRESSINGS) ×2 IMPLANT
DRSG TEGADERM 4X4.75 (GAUZE/BANDAGES/DRESSINGS) IMPLANT
ELECTRODE REM PT RTRN 9FT ADLT (ELECTROSURGICAL) ×2 IMPLANT
EVACUATOR SILICONE 100CC (DRAIN) IMPLANT
GAUZE SPONGE 4X4 12PLY STRL (GAUZE/BANDAGES/DRESSINGS) IMPLANT
GLOVE BIO SURGEON STRL SZ8 (GLOVE) ×2 IMPLANT
GOWN STRL REUS W/ TWL LRG LVL3 (GOWN DISPOSABLE) ×4 IMPLANT
GOWN STRL REUS W/ TWL XL LVL3 (GOWN DISPOSABLE) ×2 IMPLANT
HEMOSTAT SNOW SURGICEL 2X4 (HEMOSTASIS) IMPLANT
KIT BASIN OR (CUSTOM PROCEDURE TRAY) ×2 IMPLANT
KIT SHUNT ARGYLE CAROTID ART 6 (VASCULAR PRODUCTS) IMPLANT
KIT TURNOVER KIT B (KITS) ×2 IMPLANT
LOOP VESSEL MINI RED (MISCELLANEOUS) IMPLANT
NDL HYPO 25GX1X1/2 BEV (NEEDLE) IMPLANT
NEEDLE HYPO 25GX1X1/2 BEV (NEEDLE) IMPLANT
PACK CAROTID (CUSTOM PROCEDURE TRAY) ×2 IMPLANT
PAD ARMBOARD POSITIONER FOAM (MISCELLANEOUS) ×4 IMPLANT
PATCH VASC XENOSURE 1X6 (Vascular Products) IMPLANT
POSITIONER HEAD DONUT 9IN (MISCELLANEOUS) ×2 IMPLANT
SOLN 0.9% NACL 1000 ML (IV SOLUTION) ×6 IMPLANT
SOLN 0.9% NACL POUR BTL 1000ML (IV SOLUTION) ×6 IMPLANT
SOLN STERILE WATER 1000 ML (IV SOLUTION) ×2 IMPLANT
SOLN STERILE WATER BTL 1000 ML (IV SOLUTION) ×2 IMPLANT
STRIP CLOSURE SKIN 1/2X4 (GAUZE/BANDAGES/DRESSINGS) ×2 IMPLANT
SUT MNCRL AB 4-0 PS2 18 (SUTURE) ×2 IMPLANT
SUT PROLENE 6 0 BV (SUTURE) ×4 IMPLANT
SUT PROLENE BLUE 7 0 (SUTURE) IMPLANT
SUT SILK 3-0 18XBRD TIE 12 (SUTURE) IMPLANT
SUT VIC AB 2-0 CT1 TAPERPNT 27 (SUTURE) ×2 IMPLANT
SUT VIC AB 3-0 SH 27X BRD (SUTURE) ×2 IMPLANT
SYR CONTROL 10ML LL (SYRINGE) IMPLANT
TAPE UMBILICAL 1/8X30 (MISCELLANEOUS) IMPLANT
TOWEL GREEN STERILE (TOWEL DISPOSABLE) ×2 IMPLANT

## 2024-09-19 NOTE — Anesthesia Procedure Notes (Signed)
 Arterial Line Insertion Start/End10/01/2024 8:15 AM, 09/19/2024 8:20 AM Performed by: Keneth [name] POUR, MD, Scherrie Mast, CRNA, CRNA  Patient location: Pre-op. Preanesthetic checklist: patient identified, IV checked, site marked, risks and benefits discussed, surgical consent, monitors and equipment checked, pre-op evaluation, timeout performed and anesthesia consent Lidocaine  1% used for infiltration radial was placed Catheter size: 20 G Hand hygiene performed , maximum sterile barriers used  and Seldinger technique used Allen's test indicative of satisfactory collateral circulation Attempts: 1 Following insertion, dressing applied. Post procedure assessment: normal and unchanged  Patient tolerated the procedure well with no immediate complications.

## 2024-09-19 NOTE — Progress Notes (Signed)
 Pt is forgetful; per pt's wife last stroke affected his memory. Per pt's wife pt run out of Plavix  a month ago and she made several calls to PCP to refill with no success. The last dose of aspirin  was yesterday.Per pt's wife he had some alcohol last night. BP 191/104. Dr. Keneth is aware.

## 2024-09-19 NOTE — Progress Notes (Signed)
 Pt quite confused but easily redirectable. Wife called for update and informed nurse this is his baseline after previous CVA. Will continue to monitor.

## 2024-09-19 NOTE — Anesthesia Postprocedure Evaluation (Signed)
 Anesthesia Post Note  Patient: Ronald Lowe  Procedure(s) Performed: ENDARTERECTOMY, CAROTID (Left) ANGIOPLASTY, USING PATCH GRAFT BOVINE 1CMX6CM PATCH (Left: Neck)     Patient location during evaluation: PACU Anesthesia Type: General Level of consciousness: awake and alert Pain management: pain level controlled Vital Signs Assessment: post-procedure vital signs reviewed and stable Respiratory status: spontaneous breathing, nonlabored ventilation, respiratory function stable and patient connected to nasal cannula oxygen Cardiovascular status: blood pressure returned to baseline and stable Postop Assessment: no apparent nausea or vomiting Anesthetic complications: no   There were no known notable events for this encounter.  Last Vitals:  Vitals:   09/19/24 1430 09/19/24 1557  BP: (!) 141/81 (!) 162/94  Pulse: 82 89  Resp: 15 14  Temp:  36.6 C  SpO2: 96% 97%    Last Pain:  Vitals:   09/19/24 1557  TempSrc: Oral  PainSc:                  [name] MARLA Cornea

## 2024-09-19 NOTE — Transfer of Care (Signed)
 Immediate Anesthesia Transfer of Care Note  Patient: Ronald Lowe  Procedure(s) Performed: ENDARTERECTOMY, CAROTID (Left) ANGIOPLASTY, USING PATCH GRAFT BOVINE 1CMX6CM PATCH (Left: Neck)  Patient Location: PACU  Anesthesia Type:General  Level of Consciousness: awake  Airway & Oxygen Therapy: Patient Spontanous Breathing  Post-op Assessment: Report given to RN, Post -op Vital signs reviewed and stable.  Post vital signs: Reviewed and stable  Last Vitals:  Vitals Value Taken Time  BP    Temp    Pulse 85 09/19/24 12:14  Resp 22 09/19/24 12:14  SpO2 88 % 09/19/24 12:14  Vitals shown include unfiled device data.  Last Pain:  Vitals:   09/19/24 0750  TempSrc: Oral         Complications: No notable events documented.

## 2024-09-19 NOTE — Progress Notes (Signed)
 PHARMACIST LIPID MONITORING   Ronald Lowe is a 68 y.o. male s/p CEA.  Pharmacy has been consulted to optimize lipid-lowering therapy with the indication of secondary prevention for clinical ASCVD.  Recent Labs:  Lipid Panel (last 6 months):   No results found for: CHOL, TRIG, HDL, CHOLHDL, VLDL, LDLCALC, LDLDIRECT  Hepatic function panel (last 6 months):   Lab Results  Component Value Date   AST 25 09/17/2024   ALT 15 09/17/2024   ALKPHOS 55 09/17/2024   BILITOT 1.1 09/17/2024    SCr (since admission):   Serum creatinine: 1.09 mg/dL 90/69/74 8869 Estimated creatinine clearance: 62.5 mL/min  Current therapy and lipid therapy tolerance Current lipid-lowering therapy: atorvastatin  10mg /day Previous lipid-lowering therapies (if applicable): Atorvastatin  40mg /day Documented or reported allergies or intolerances to lipid-lowering therapies (if applicable): none   Plan:    1.Statin intensity (high intensity recommended for all patients regardless of the LDL):  Add or increase statin to high intensity.  2.Add ezetimibe (if any one of the following):   Not indicated at this time.  3.Refer to lipid clinic:   No  4.Follow-up with:  Primary care provider - Tanda Bleacher, MD  5.Follow-up labs after discharge:  Changes in lipid therapy were made. Check a lipid panel in 8-12 weeks then annually.      Prentice Poisson, PharmD Clinical Pharmacist **Pharmacist phone directory can now be found on [email or web address] ([email or web address]).  Listed under [facility].

## 2024-09-19 NOTE — Anesthesia Procedure Notes (Signed)
 Procedure Name: Intubation Date/Time: 09/19/2024 10:13 AM  Performed by: Vanice Search, RNPre-anesthesia Checklist: Patient identified, Emergency Drugs available, Suction available and Patient being monitored Patient Re-evaluated:Patient Re-evaluated prior to induction Oxygen Delivery Method: Circle System Utilized Preoxygenation: Pre-oxygenation with 100% oxygen Induction Type: IV induction Ventilation: Two handed mask ventilation required and Oral airway inserted - appropriate to patient size Laryngoscope Size: Glidescope and 3 (elective) Grade View: Grade I Tube type: Oral Tube size: 7.5 mm Number of attempts: 1 Airway Equipment and Method: Oral airway, Video-laryngoscopy and Rigid stylet (elective glide) Placement Confirmation: ETT inserted through vocal cords under direct vision, positive ETCO2 and breath sounds checked- equal and bilateral Secured at: 22 cm Tube secured with: Tape Dental Injury: Teeth and Oropharynx as per pre-operative assessment

## 2024-09-19 NOTE — Progress Notes (Signed)
  Progress Note    09/19/2024 2:20 PM * Day of Surgery *  Subjective:  no complaints- seems a little confused. Continues to try to get up and has said he needs to urinate several times. Given urinal but not understanding that he can use it. Denies any pain. Moving all extremities   Vitals:   09/19/24 1330 09/19/24 1345  BP: (!) 162/87 (!) 155/88  Pulse: (!) 59 63  Resp: 12 11  Temp:    SpO2: 95% 98%   Physical Exam: Cardiac:  regular Lungs:  non labored Incisions:  left neck incision dressing is c/d/I without swelling or hematoma Extremities:  moving all extremities without deficits Neurologic: alert and oriented to self, speech coherent  CBC    Component Value Date/Time   WBC 4.1 09/17/2024 1130   RBC 4.63 09/17/2024 1130   HGB 15.1 09/17/2024 1130   HGB 15.8 07/01/2022 1039   HCT 43.9 09/17/2024 1130   HCT 45.1 07/01/2022 1039   PLT 231 09/17/2024 1130   PLT 280 07/01/2022 1039   MCV 94.8 09/17/2024 1130   MCV 103 (H) 07/01/2022 1039   MCH 32.6 09/17/2024 1130   MCHC 34.4 09/17/2024 1130   RDW 14.0 09/17/2024 1130   RDW 13.7 07/01/2022 1039   LYMPHSABS 1.0 06/17/2024 0524   LYMPHSABS 0.8 07/01/2022 1039   MONOABS 0.9 06/17/2024 0524   EOSABS 0.2 06/17/2024 0524   EOSABS 0.2 07/01/2022 1039   BASOSABS 0.1 06/17/2024 0524   BASOSABS 0.1 07/01/2022 1039    BMET    Component Value Date/Time   NA 139 09/17/2024 1130   NA 133 (L) 07/01/2022 1039   K 3.9 09/17/2024 1130   CL 106 09/17/2024 1130   CO2 23 09/17/2024 1130   GLUCOSE 94 09/17/2024 1130   BUN 8 09/17/2024 1130   BUN 7 (L) 07/01/2022 1039   CREATININE 1.09 09/17/2024 1130   CALCIUM  8.8 (L) 09/17/2024 1130   GFRNONAA >60 09/17/2024 1130   GFRAA 58 (L) 09/08/2020 0910    INR    Component Value Date/Time   INR 1.0 09/17/2024 1130     Intake/Output Summary (Last 24 hours) at 09/19/2024 1420 Last data filed at 09/19/2024 1153 Gross per 24 hour  Intake 1050 ml  Output --  Net 1050 ml      Assessment/Plan:  68 y.o. male is s/p left CEA * Day of Surgery *    Neurologically intact Left neck incision is soft, no hematoma. Dressing c/d/i Seems a little confused/ agitated but likely anesthesia wearing off still Hypertensive. Labetalol  and Hydralazine  given. At baseline. Goal SBP <160 To 4 E this afternoon when bed available  Teretha Damme, PA-C [facility] [phone] 09/19/2024 2:20 PM

## 2024-09-19 NOTE — Op Note (Signed)
 DATE OF SERVICE: 09/19/2024  PATIENT:  Ronald Lowe  68 y.o. male  PRE-OPERATIVE DIAGNOSIS: Symptomatic left carotid artery stenosis  POST-OPERATIVE DIAGNOSIS:  Same  PROCEDURE:   Left carotid endarterectomy and bovine pericardial patch angioplasty  SURGEON:  Surgeons and Role:    * [name] [name], MD - Primary  ASSISTANT: Curry Damme, PA-C  An experienced assistant was required given the complexity of this procedure and the standard of surgical care. My assistant helped with exposure through counter tension, suctioning, ligation and retraction to better visualize the surgical field.  My assistant expedited sewing during the case by following my sutures. Wherever I use the term we in the report, my assistant actively helped me with that portion of the procedure.  ANESTHESIA:   general  EBL: 100 mL  BLOOD ADMINISTERED:none  DRAINS: none   LOCAL MEDICATIONS USED:  NONE  SPECIMEN: None  COUNTS: confirmed correct .  TOURNIQUET: None  PATIENT DISPOSITION:  PACU - hemodynamically stable.   Delay start of Pharmacological VTE agent (>24hrs) due to surgical blood loss or risk of bleeding: no  INDICATION FOR PROCEDURE: Ronald Lowe is a 68 y.o. male with symptomatic left carotid artery stenosis.  He suffered a stroke about 3 months ago with significant post stroke delirium and agitation.  As such he was not considered for urgent carotid endarterectomy.  He was seen in the outpatient setting and found to have recovered nicely despite some persistent cognitive and behavioral issues.  After careful discussion of risks, benefits, and alternatives the patient was offered carotid endarterectomy. We specifically discussed risk of stroke, cranial nerve injury, hematoma. The patient understood and wished to proceed.  OPERATIVE FINDINGS: Bulky, near occlusive plaque at the origin of the internal carotid artery extending for several centimeters to the internal carotid artery.  Good  technical result achieved from endarterectomy.  Good result from patch angioplasty.  Completion Doppler exam normal.  Completion duplex exam shows no free flaps or intimal defects.  DESCRIPTION OF PROCEDURE: After identification of the patient in the pre-operative holding area, the patient was transferred to the operating room. The patient was positioned supine on the operating room table. Anesthesia was induced. The left neck was prepped and draped in standard fashion. A surgical pause was performed confirming correct patient, procedure, and operative location.  Intraoperative ultrasound was used to map the course of the carotid artery and its bifurcation.  I was incision was planned on the anterior aspect of the sternocleidomastoid and made with a 10 blade.  Incision was carried down through the subcutaneous tissue and the platysma using Bovie electrocautery.  Subplatysmal flaps were elevated.  An avascular plane was developed over the anterior aspect of the sternocleidomastoid and this muscle was rotated posterior laterally.  The carotid sheath was encountered.  The internal jugular vein was skeletonized for several centimeters.  A facial vein did not overlay the bifurcation as is typically seen.  We did identify the carotid bifurcation and a fairly rotated internal carotid artery.  After several centimeters of adequate skeletonization, soft segments of the internal carotid artery, external carotid artery and common carotid artery were identified.  These vessels were encircled with Silastic Vesseloops.  The common carotid artery was encircled with a Rummel tourniquet.  The patient was systemically heparinized.  Activated clotting time measurements were used at the case confirm adequate anticoagulation.  Clamp was applied to the internal carotid artery, common carotid artery, and external carotid artery.  An anterior arteriotomy was made with 11  blade and extended with Potts scissors.  The plaque was  visualized and a nice endpoint was seen on the internal carotid artery.  Standard endarterectomy was then performed with [facility] developing a medial plane throughout the diseased artery.  The endpoint of the internal carotid artery feathered nicely.  The endpoint of the common carotid artery needed to be cut with Potts scissors.  Eversion endarterectomy was performed of the external carotid artery with good result.  Once the endarterectomy was performed.  The plane was inspected carefully and methodically under loupe magnification.  Any and all debris or loose flaps were removed.  The distal endpoint was tacked with 2 interrupted 7-0 Prolene sutures.  A bovine pericardial patch was prepared for manufactures instructions and brought to the field.  Upon completion of the case instrument and sharps counts were confirmed correct. The patient was transferred to the *** PACU in ***good condition. I was present for all portions of the procedure.  FOLLOW UP PLAN: Assuming a normal postoperative course, *** will see the patient in *** weeks with ***.   [name]. [name], MD [facility] Phone Number: [phone] 09/19/2024 5:46 PM

## 2024-09-19 NOTE — Interval H&P Note (Signed)
 History and Physical Interval Note:  09/19/2024 9:40 AM  Ronald Lowe  has presented today for surgery, with the diagnosis of symptomatic left carotid stenosis.  The various methods of treatment have been discussed with the patient and family. After consideration of risks, benefits and other options for treatment, the patient has consented to  Procedure(s): ENDARTERECTOMY, CAROTID (Left) as a surgical intervention.  The patient's history has been reviewed, patient examined, no change in status, stable for surgery.  I have reviewed the patient's chart and labs.  Questions were answered to the patient's satisfaction.     [name]

## 2024-09-20 ENCOUNTER — Encounter (HOSPITAL_COMMUNITY): Payer: Self-pay | Admitting: Vascular Surgery

## 2024-09-20 DIAGNOSIS — Z48812 Encounter for surgical aftercare following surgery on the circulatory system: Secondary | ICD-10-CM

## 2024-09-20 LAB — CBC
HCT: 41 % (ref 39.0–52.0)
Hemoglobin: 14.4 g/dL (ref 13.0–17.0)
MCH: 33 pg (ref 26.0–34.0)
MCHC: 35.1 g/dL (ref 30.0–36.0)
MCV: 94 fL (ref 80.0–100.0)
Platelets: 228 K/uL (ref 150–400)
RBC: 4.36 MIL/uL (ref 4.22–5.81)
RDW: 13.8 % (ref 11.5–15.5)
WBC: 9.1 K/uL (ref 4.0–10.5)
nRBC: 0 % (ref 0.0–0.2)

## 2024-09-20 LAB — BASIC METABOLIC PANEL WITH GFR
Anion gap: 14 (ref 5–15)
BUN: 16 mg/dL (ref 8–23)
CO2: 20 mmol/L — ABNORMAL LOW (ref 22–32)
Calcium: 8.6 mg/dL — ABNORMAL LOW (ref 8.9–10.3)
Chloride: 104 mmol/L (ref 98–111)
Creatinine, Ser: 1.27 mg/dL — ABNORMAL HIGH (ref 0.61–1.24)
GFR, Estimated: >60 mL/min (ref >60)
Glucose, Bld: 128 mg/dL — ABNORMAL HIGH (ref 70–99)
Potassium: 3.7 mmol/L (ref 3.5–5.1)
Sodium: 138 mmol/L (ref 135–145)

## 2024-09-20 LAB — LIPID PANEL
Cholesterol: 121 mg/dL (ref 0–200)
HDL: 55 mg/dL (ref >40)
LDL Cholesterol: 59 mg/dL (ref 0–99)
Total CHOL/HDL Ratio: 2.2 ratio
Triglycerides: 36 mg/dL (ref <150)
VLDL: 7 mg/dL (ref 0–40)

## 2024-09-20 LAB — POCT ACTIVATED CLOTTING TIME: Activated Clotting Time: 273 s

## 2024-09-20 MED ORDER — HYDROXYZINE HCL 25 MG PO TABS
25.0000 mg | ORAL_TABLET | Freq: Four times a day (QID) | ORAL | Status: DC | PRN
  Administered 2024-09-20: 25 mg via ORAL
  Filled 2024-09-20: qty 1

## 2024-09-20 MED ORDER — ATORVASTATIN CALCIUM 40 MG PO TABS: 40.0000 mg | ORAL_TABLET | Freq: Every day | ORAL | 11 refills | Status: DC

## 2024-09-20 MED ORDER — HYDROCODONE-ACETAMINOPHEN 5-325 MG PO TABS: 1.0000 | ORAL_TABLET | Freq: Four times a day (QID) | ORAL | 0 refills | Status: AC | PRN

## 2024-09-20 NOTE — Progress Notes (Signed)
   09/20/24 1006  TOC Brief Assessment  Insurance and Status Reviewed  Patient has primary care physician Yes  Home environment has been reviewed home w/ spouse  Prior level of function: independent  Prior/Current Home Services No current home services  Social Drivers of Health Review SDOH reviewed no interventions necessary  Readmission risk has been reviewed Yes  Transition of care needs no transition of care needs at this time    Pt stable for transition home today, no HH or DME needs noted. Family to transport home.

## 2024-09-20 NOTE — Progress Notes (Signed)
 Reviewed AVS, patient wife expressed understanding of medications, MD follow up reviewed.   Removed IV, Site clean, dry and intact.  See LDA for information on wounds at discharge. CCMD contacted and informed patients is being discharged.  Patient states all belongings brought to the hospital at time of admission are accounted for and packed to take home.  Patient wife informed and expressed understanding where to pick up discharge medications.  Staff contacted to transport patient to entrance A,  where family member was waiting in vehicle to transport home.

## 2024-09-20 NOTE — Progress Notes (Addendum)
  Progress Note    09/20/2024 7:56 AM 1 Day Post-Op  Subjective:  no complaints   Vitals:   09/20/24 0310 09/20/24 0750  BP: (!) 172/111 (!) 163/101  Pulse:  (!) 55  Resp: 15 16  Temp: 97.7 F (36.5 C) 97.7 F (36.5 C)  SpO2: 99% 97%   Physical Exam: Lungs:  non labored Incisions:  L neck c/d/i Extremities:  moving all ext well Neurologic: CN grossly intact; some confusion  CBC    Component Value Date/Time   WBC 9.1 09/20/2024 0656   RBC 4.36 09/20/2024 0656   HGB 14.4 09/20/2024 0656   HGB 15.8 07/01/2022 1039   HCT 41.0 09/20/2024 0656   HCT 45.1 07/01/2022 1039   PLT 228 09/20/2024 0656   PLT 280 07/01/2022 1039   MCV 94.0 09/20/2024 0656   MCV 103 (H) 07/01/2022 1039   MCH 33.0 09/20/2024 0656   MCHC 35.1 09/20/2024 0656   RDW 13.8 09/20/2024 0656   RDW 13.7 07/01/2022 1039   LYMPHSABS 1.0 06/17/2024 0524   LYMPHSABS 0.8 07/01/2022 1039   MONOABS 0.9 06/17/2024 0524   EOSABS 0.2 06/17/2024 0524   EOSABS 0.2 07/01/2022 1039   BASOSABS 0.1 06/17/2024 0524   BASOSABS 0.1 07/01/2022 1039    BMET    Component Value Date/Time   NA 138 09/20/2024 0656   NA 133 (L) 07/01/2022 1039   K 3.7 09/20/2024 0656   CL 104 09/20/2024 0656   CO2 20 (L) 09/20/2024 0656   GLUCOSE 128 (H) 09/20/2024 0656   BUN 16 09/20/2024 0656   BUN 7 (L) 07/01/2022 1039   CREATININE 1.27 (H) 09/20/2024 0656   CALCIUM  8.6 (L) 09/20/2024 0656   GFRNONAA >60 09/20/2024 0656   GFRAA 58 (L) 09/08/2020 0910    INR    Component Value Date/Time   INR 1.0 09/17/2024 1130     Intake/Output Summary (Last 24 hours) at 09/20/2024 0756 Last data filed at 09/20/2024 0354 Gross per 24 hour  Intake 1610 ml  Output 400 ml  Net 1210 ml     Assessment/Plan:  68 y.o. male is s/p L CEA 1 Day Post-Op   Neuro exam remains at baseline L neck incision well appearing Ambulating in room without difficulty Ok for discharge home this morning   Donnice Sender, PA-C [facility] [phone] 09/20/2024 7:56 AM  VASCULAR STAFF ADDENDUM: I have independently interviewed and examined the patient. I agree with the above.  No change in mental status.  He is at his baseline level of confusion. No cranial nerve weakness.  No extremity weakness. Safer discharge from my standpoint.  [name]. [name], MD [facility] Phone Number: [phone] 09/20/2024 11:07 AM

## 2024-09-20 NOTE — Discharge Instructions (Signed)
   [facility]  Discharge Instructions   Carotid Endarterectomy (CEA)  Please refer to the following instructions for your post-procedure care. Your surgeon or physician assistant will discuss any changes with you.  Activity  You are encouraged to walk as much as you can. You can slowly return to normal activities but must avoid strenuous activity and heavy lifting until your doctor tell you it's OK. Avoid activities such as vacuuming or swinging a golf club. You can drive after one week if you are comfortable and you are no longer taking prescription pain medications. It is normal to feel tired for serval weeks after your surgery. It is also normal to have difficulty with sleep habits, eating, and bowel movements after surgery. These will go away with time.  Bathing/Showering  You may shower after you come home. Do not soak in a bathtub, hot tub, or swim until the incision heals completely.  Incision Care  Shower every day. Clean your incision with mild soap and water. Pat the area dry with a clean towel. You do not need a bandage unless otherwise instructed. Do not apply any ointments or creams to your incision. You may have skin glue on your incision. Do not peel it off. It will come off on its own in about one week. Your incision may feel thickened and raised for several weeks after your surgery. This is normal and the skin will soften over time. For Men Only: It's OK to shave around the incision but do not shave the incision itself for 2 weeks. It is common to have numbness under your chin that could last for several months.  Diet  Resume your normal diet. There are no special food restrictions following this procedure. A low fat/low cholesterol diet is recommended for all patients with vascular disease. In order to heal from your surgery, it is CRITICAL to get adequate nutrition. Your body requires vitamins, minerals, and protein. Vegetables are the best  source of vitamins and minerals. Vegetables also provide the perfect balance of protein. Processed food has little nutritional value, so try to avoid this.        Medications  Resume taking all of your medications unless your doctor or physician assistant tells you not to. If your incision is causing pain, you may take over-the- counter pain relievers such as acetaminophen  (Tylenol ). If you were prescribed a stronger pain medication, please be aware these medications can cause nausea and constipation. Prevent nausea by taking the medication with a snack or meal. Avoid constipation by drinking plenty of fluids and eating foods with a high amount of fiber, such as fruits, vegetables, and grains. Do not take Tylenol  if you are taking prescription pain medications.  Follow Up  Our office will schedule a follow up appointment 2-3 weeks following discharge.  Please call us  immediately for any of the following conditions  Increased pain, redness, drainage (pus) from your incision site. Fever of 101 degrees or higher. If you should develop stroke (slurred speech, difficulty swallowing, weakness on one side of your body, loss of vision) you should call 911 and go to the nearest emergency room.  Reduce your risk of vascular disease:  Stop smoking. If you would like help call [facility] at 1-800-QUIT-NOW (986-078-2376) or  at 864-830-8805. Manage your cholesterol Maintain a desired weight Control your diabetes Keep your blood pressure down  If you have any questions, please call the office at [phone].

## 2024-09-21 NOTE — Discharge Summary (Signed)
 Discharge Summary     Ronald Lowe 30-Jun-1956 68 y.o. male  [id number]  Admission Date: 09/19/2024  Discharge Date: 09/20/24  Physician: Dr. [name]  Admission Diagnosis: Symptomatic carotid artery stenosis, left [I65.22]  Discharge Day services:    See progress note 09/20/24  Hospital Course:  [name] is a 68 year old male who sustained a left-sided CVA related to symptomatic left ICA stenosis.  He was brought in as an outpatient and underwent left carotid endarterectomy by Dr. [name] on 09/19/2024.  He tolerated procedure well and was admitted to the hospital postoperatively.  POD #1 his neuro exam remained at baseline.  His left neck incision was without hematoma.  He was ready for discharge home.  He will follow-up in the office in a few weeks to recheck his incision.  He will continue his aspirin , Plavix , statin daily.  He was prescribed 1 to 2 days of narcotic pain medication for continued postoperative pain control.  He was discharged home with his wife in stable condition.   Recent Labs    09/20/24 0656  NA 138  K 3.7  CL 104  CO2 20*  GLUCOSE 128*  BUN 16  CALCIUM  8.6*   Recent Labs    09/20/24 0656  WBC 9.1  HGB 14.4  HCT 41.0  PLT 228   No results for input(s): INR in the last 72 hours.     Discharge Diagnosis:  Symptomatic carotid artery stenosis, left [I65.22]  Secondary Diagnosis: Patient Active Problem List   Diagnosis Date Noted   Symptomatic carotid artery stenosis, left 09/19/2024   Left middle cerebral artery stroke (HCC) 06/14/2024   Hyperlipidemia 06/07/2024   Right arm pain 06/07/2024   Acute cerebrovascular accident (CVA) (HCC) 06/07/2024   Acute CVA (cerebrovascular accident) (HCC) 06/06/2024   Status post left rotator cuff repair 02/02/2023   Foraminal stenosis of cervical region 03/23/2022   Left arm weakness 03/23/2022   Paroxysmal atrial fibrillation (HCC) 12/22/2021   Secondary hypercoagulable state 12/22/2021    Medication management 10/20/2021   Chronic obstructive pulmonary disease (HCC) 10/19/2021   Alcohol withdrawal delirium (HCC) 06/17/2021   Atrial fibrillation with rapid ventricular response (HCC) 06/17/2021   Macrocytic anemia 06/17/2021   COPD exacerbation (HCC) 06/14/2021   Multifocal pneumonia    CAP (community acquired pneumonia) 06/13/2021   Emphysema lung (HCC) 03/17/2021   Positive hepatitis C antibody test 10/02/2019   Tobacco dependence 09/24/2019   Alcohol use disorder 09/24/2019   Acute kidney injury 06/12/2014   Orthostatic hypotension 06/12/2014   EKG abnormality 06/12/2014   Elevated blood pressure reading in office with diagnosis of hypertension 06/12/2014   HTN (hypertension) 04/12/2013   Past Medical History:  Diagnosis Date   Aortic atherosclerosis    Cervical spinal stenosis    Chronic kidney disease    COPD (chronic obstructive pulmonary disease) (HCC)    Coronary artery calcification seen on CT scan    ETOH abuse    a.) 28+ standard drinks/week   GERD (gastroesophageal reflux disease)    History of amiodarone  therapy    a.) in the past; self discontinued   History of medication noncompliance    a.) reported 10/2022 that he has been off NOAC for several months secondary to vertiginous symptoms, not acting like himself, and depression; b.) off/not taking as prescribed: antiarrhythmics, antihypertensives, statins, and MDIs   Hyperlipidemia    Hypertension    Long term current use of anticoagulant    a.) rivaroxaban    PAF (paroxysmal atrial fibrillation) (HCC)  a.) CHA2DS2VASc = 3 (age, HTN, vascular disease history);  b.) rate/rhythm maintained on oral carvediolol (self discontinued diltiazem  + amiodarone  + metoprolol ); chronically anticoagulated with rivaroxaban  (switched from apixaban  11/17/2022)   Stroke [facility])    Tobacco abuse     Allergies as of 09/20/2024   No Known Allergies      Medication List     TAKE these medications    acetaminophen   325 MG tablet Commonly known as: TYLENOL  Take 2 tablets (650 mg total) by mouth every 4 (four) hours as needed for mild pain (pain score 1-3) or fever (or temp > 37.5 C (99.5 F)).   albuterol  108 (90 Base) MCG/ACT inhaler Commonly known as: VENTOLIN  HFA Inhale 1-2 puffs into the lungs every 6 (six) hours as needed for wheezing or shortness of breath.   aspirin  EC 81 MG tablet Take 1 tablet (81 mg total) by mouth daily. Swallow whole.   atorvastatin  40 MG tablet Commonly known as: LIPITOR Take 1 tablet (40 mg total) by mouth daily. What changed:  medication strength how much to take   clopidogrel  75 MG tablet Commonly known as: PLAVIX  Take 1 tablet (75 mg total) by mouth daily.   cyanocobalamin  1000 MCG tablet Commonly known as: VITAMIN B12 Take 1 tablet (1,000 mcg total) by mouth daily.   famotidine  40 MG tablet Commonly known as: PEPCID  Take 1 tablet (40 mg total) by mouth daily.   folic acid  1 MG tablet Commonly known as: FOLVITE  Take 1 tablet (1 mg total) by mouth daily.   HYDROcodone-acetaminophen  5-325 MG tablet Commonly known as: NORCO/VICODIN Take 1 tablet by mouth every 6 (six) hours as needed for moderate pain (pain score 4-6).   hydrOXYzine  25 MG capsule Commonly known as: VISTARIL  Take 25 mg by mouth every 6 (six) hours as needed for anxiety.   ibuprofen 200 MG tablet Commonly known as: ADVIL Take 200-800 mg by mouth every 6 (six) hours as needed for moderate pain (pain score 4-6).   lisinopril  20 MG tablet Commonly known as: ZESTRIL  TAKE 1 TABLET BY MOUTH EVERY DAY   nicotine  14 mg/24hr patch Commonly known as: NICODERM CQ  - dosed in mg/24 hours 14 mg patch daily x 1 week then 7 mg patch daily x 3 weeks and stop   polyethylene glycol powder 17 GM/SCOOP powder Commonly known as: GLYCOLAX /MIRALAX  Take 17 g by mouth 2 (two) times daily as needed.   QUEtiapine  25 MG tablet Commonly known as: SEROQUEL  Take 1 tablet (25 mg total) by mouth 2 (two)  times daily as needed (aggitation). What changed: how much to take   thiamine  100 MG/ML injection Commonly known as: VITAMIN B1 Inject 1 mL (100 mg total) into the vein daily.   traZODone  50 MG tablet Commonly known as: DESYREL  Take 1 tablet (50 mg total) by mouth at bedtime as needed for sleep.   Trelegy Ellipta  100-62.5-25 MCG/ACT Aepb Generic drug: Fluticasone -Umeclidin-Vilant INHALE 1 PUFF BY MOUTH EVERY DAY         Discharge Instructions:   [facility] Discharge Instructions Carotid Endarterectomy (CEA)  Please refer to the following instructions for your post-procedure care. Your surgeon or physician assistant will discuss any changes with you.  Activity  You are encouraged to walk as much as you can. You can slowly return to normal activities but must avoid strenuous activity and heavy lifting until your doctor tell you it's OK. Avoid activities such as vacuuming or swinging a golf club. You can drive  after one week if you are comfortable and you are no longer taking prescription pain medications. It is normal to feel tired for serval weeks after your surgery. It is also normal to have difficulty with sleep habits, eating, and bowel movements after surgery. These will go away with time.  Bathing/Showering  You may shower after you come home. Do not soak in a bathtub, hot tub, or swim until the incision heals completely.  Incision Care  Shower every day. Clean your incision with mild soap and water. Pat the area dry with a clean towel. You do not need a bandage unless otherwise instructed. Do not apply any ointments or creams to your incision. You may have skin glue on your incision. Do not peel it off. It will come off on its own in about one week. Your incision may feel thickened and raised for several weeks after your surgery. This is normal and the skin will soften over time. For Men Only: It's OK to shave around the incision but do not  shave the incision itself for 2 weeks. It is common to have numbness under your chin that could last for several months.  Diet  Resume your normal diet. There are no special food restrictions following this procedure. A low fat/low cholesterol diet is recommended for all patients with vascular disease. In order to heal from your surgery, it is CRITICAL to get adequate nutrition. Your body requires vitamins, minerals, and protein. Vegetables are the best source of vitamins and minerals. Vegetables also provide the perfect balance of protein. Processed food has little nutritional value, so try to avoid this.  Medications  Resume taking all of your medications unless your doctor or physician assistant tells you not to.  If your incision is causing pain, you may take over-the- counter pain relievers such as acetaminophen  (Tylenol ). If you were prescribed a stronger pain medication, please be aware these medications can cause nausea and constipation.  Prevent nausea by taking the medication with a snack or meal. Avoid constipation by drinking plenty of fluids and eating foods with a high amount of fiber, such as fruits, vegetables, and grains. Do not take Tylenol  if you are taking prescription pain medications.  Follow Up  Our office will schedule a follow up appointment 2-3 weeks following discharge.  Please call us  immediately for any of the following conditions  Increased pain, redness, drainage (pus) from your incision site. Fever of 101 degrees or higher. If you should develop stroke (slurred speech, difficulty swallowing, weakness on one side of your body, loss of vision) you should call 911 and go to the nearest emergency room.  Reduce your risk of vascular disease:  Stop smoking. If you would like help call [facility] at 1-800-QUIT-NOW ((817)679-3499) or [place] at [phone]. Manage your cholesterol Maintain a desired weight Control your diabetes Keep your blood pressure  down  If you have any questions, please call the office at [phone].   Disposition: home  Patient's condition: is Good  Follow up: 1. [facility] in 2 weeks.   Donnice Sender, PA-C [facility] [phone]   --- For [facility] Registry use ---   Modified Rankin score at D/C (0-6): 0  IV medication needed for:  1. Hypertension: No 2. Hypotension: No  Post-op Complications: No  1. Post-op CVA or TIA: No  If yes: Event classification (right eye, left eye, right cortical, left cortical, verterobasilar, other):   If yes: Timing of event (intra-op, <6 hrs post-op, >=6 hrs post-op,  unknown):   2. CN injury: No  If yes: CN  injuried   3. Myocardial infarction: No  If yes: Dx by (EKG or clinical, Troponin):   4.  CHF: No  5.  Dysrhythmia (new): No  6. Wound infection: No  7. Reperfusion symptoms: No  8. Return to OR: No  If yes: return to OR for (bleeding, neurologic, other CEA incision, other):   Discharge medications: Statin use:  Yes ASA use:  Yes   Beta blocker use:  No ACE-Inhibitor use:  Yes  ARB use:  No CCB use: No P2Y12 Antagonist use: Yes, [ ]  Plavix , [ ]  Plasugrel, [ ]  Ticlopinine, [ ]  Ticagrelor, [ ]  Other, [ ]  No for medical reason, [ ]  Non-compliant, [ ]  Not-indicated Anti-coagulant use:  No, [ ]  Warfarin, [ ]  Rivaroxaban , [ ]  Dabigatran,

## 2024-09-23 ENCOUNTER — Telehealth: Payer: Self-pay | Admitting: *Deleted

## 2024-09-23 NOTE — Transitions of Care (Post Inpatient/ED Visit) (Signed)
   09/23/2024  Name: Ronald Lowe MRN: [id number] DOB: 11/09/1956  Today's TOC FU Call Status: Today's TOC FU Call Status:: Unsuccessful Call (1st Attempt) Unsuccessful Call (1st Attempt) Date: 09/23/24  Attempted to reach the patient regarding the most recent Inpatient/ED visit.  Follow Up Plan: Additional outreach attempts will be made to reach the patient to complete the Transitions of Care (Post Inpatient/ED visit) call.   Andrea Dimes RN, BSN [place]  [facility] RN Care Manager [phone]

## 2024-09-24 ENCOUNTER — Telehealth: Payer: Self-pay | Admitting: *Deleted

## 2024-09-24 DIAGNOSIS — I6522 Occlusion and stenosis of left carotid artery: Secondary | ICD-10-CM

## 2024-09-24 NOTE — Transitions of Care (Post Inpatient/ED Visit) (Signed)
 09/24/2024  Name: Ronald Lowe MRN: [id number] DOB: 09/11/1956  Today's TOC FU Call Status: Today's TOC FU Call Status:: Successful TOC FU Call Completed TOC FU Call Complete Date: 09/24/24 Patient's Name and Date of Birth confirmed.  Transition Care Management Follow-up Telephone Call Date of Discharge: 09/20/24 Discharge Facility: [facility]) Type of Discharge: Inpatient Admission Primary Inpatient Discharge Diagnosis:: Carotid Endarterectomy How have you been since you were released from the hospital?: Better Any questions or concerns?: Yes Patient Questions/Concerns:: DPR expressed frustration over medication management and difficulty getting refills. Patient Questions/Concerns Addressed: Other: (Medication review, advised [facility] , looks like 90 day refill on 07/25/24. DPR denies having this medication. Educated on compliance packaging. Pharmacy referral placed.)  Items Reviewed: Did you receive and understand the discharge instructions provided?: Yes Medications obtained,verified, and reconciled?: Yes (Medications Reviewed) Any new allergies since your discharge?: No Dietary orders reviewed?: Yes Type of Diet Ordered:: low fat/low cholesterol diet Do you have support at home?: Yes People in Home [RPT]: spouse Name of Support/Comfort Primary Source: [name]/DPR  Medications Reviewed Today: Medications Reviewed Today     Reviewed by Lucky Andrea LABOR, RN (Registered Nurse) on 09/24/24 at 1510  Med List Status: <None>   Medication Order Taking? Sig Documenting Provider Last Dose Status Informant  acetaminophen  (TYLENOL ) 325 MG tablet [id number] Yes Take 2 tablets (650 mg total) by mouth every 4 (four) hours as needed for mild pain (pain score 1-3) or fever (or temp > 37.5 C (99.5 F)). AngiulliToribio PARAS, PA-C  Active Spouse/Significant Other, Pharmacy Records  albuterol  (VENTOLIN  HFA) 108 [phone] Base) MCG/ACT inhaler [id number]  Inhale 1-2 puffs  into the lungs every 6 (six) hours as needed for wheezing or shortness of breath.  Patient not taking: Reported on 09/24/2024   Pegge Toribio PARAS, PA-C  Active Spouse/Significant Other, Pharmacy Records  aspirin  EC 81 MG tablet [id number] Yes Take 1 tablet (81 mg total) by mouth daily. Swallow whole. Pegge Toribio PARAS, PA-C  Active Spouse/Significant Other, Pharmacy Records  atorvastatin  (LIPITOR) 40 MG tablet [id number] Yes Take 1 tablet (40 mg total) by mouth daily. Bethanie Cough, PA-C  Active   clopidogrel  (PLAVIX ) 75 MG tablet [id number] Yes Take 1 tablet (75 mg total) by mouth daily. [name] [name], MD  Active Spouse/Significant Other, Pharmacy Records  cyanocobalamin  1000 MCG tablet [id number]  Take 1 tablet (1,000 mcg total) by mouth daily.  Patient not taking: Reported on 09/24/2024   Pegge Toribio PARAS, PA-C  Active Spouse/Significant Other, Pharmacy Records  famotidine  (PEPCID ) 40 MG tablet [id number] Yes Take 1 tablet (40 mg total) by mouth daily. Pegge Toribio PARAS, PA-C  Active Spouse/Significant Other, Pharmacy Records  folic acid  (FOLVITE ) 1 MG tablet [id number]  Take 1 tablet (1 mg total) by mouth daily.  Patient not taking: Reported on 09/24/2024   Pegge Toribio PARAS, PA-C  Active Spouse/Significant Other, Pharmacy Records  HYDROcodone-acetaminophen  (NORCO/VICODIN) 5-325 MG tablet [id number] Yes Take 1 tablet by mouth every 6 (six) hours as needed for moderate pain (pain score 4-6). Bethanie Cough, PA-C  Active   hydrOXYzine  (VISTARIL ) 25 MG capsule [id number] Yes Take 25 mg by mouth every 6 (six) hours as needed for anxiety. [provider]  Active Spouse/Significant Other, Pharmacy Records  ibuprofen (ADVIL) 200 MG tablet [id number] Yes Take 200-800 mg by mouth every 6 (six) hours as needed for moderate pain (pain score 4-6). [provider]  Active Spouse/Significant Other, Pharmacy Records  lisinopril  (ZESTRIL ) 20 MG  tablet [id number]  TAKE 1 TABLET BY MOUTH EVERY  DAY  Patient not taking: Reported on 09/24/2024   Delford Maude BROCKS, MD  Active Spouse/Significant Other, Pharmacy Records           Med Note JACKOLYN WADDELL DEL   Tue Sep 17, 2024 11:19 AM) Recent dispenses, spouse does not recall him taking.  nicotine  (NICODERM CQ  - DOSED IN MG/24 HOURS) 14 mg/24hr patch [id number]  14 mg patch daily x 1 week then 7 mg patch daily x 3 weeks and stop  Patient not taking: Reported on 09/24/2024   Pegge Toribio PARAS, PA-C  Active Spouse/Significant Other, Pharmacy Records  polyethylene glycol powder (GLYCOLAX /MIRALAX ) 17 GM/SCOOP powder [id number]  Take 17 g by mouth 2 (two) times daily as needed.  Patient not taking: Reported on 09/24/2024   Tanda Bleacher, MD  Active Spouse/Significant Other, Pharmacy Records  QUEtiapine  (SEROQUEL ) 25 MG tablet [id number]  Take 1 tablet (25 mg total) by mouth 2 (two) times daily as needed (aggitation).  Patient not taking: Reported on 09/24/2024   Pegge Toribio PARAS, PA-C  Active Spouse/Significant Other, Pharmacy Records  thiamine  (VITAMIN B1) 100 MG/ML injection [id number]  Inject 1 mL (100 mg total) into the vein daily.  Patient not taking: Reported on 09/24/2024   Pegge Toribio PARAS, PA-C  Active Spouse/Significant Other, Pharmacy Records  traZODone  (DESYREL ) 50 MG tablet [id number] Yes Take 1 tablet (50 mg total) by mouth at bedtime as needed for sleep. Pegge Toribio PARAS, PA-C  Active Spouse/Significant Other, Pharmacy Records  TRELEGY ELLIPTA  100-62.5-25 MCG/ACT AEPB [id number] Yes INHALE 1 PUFF BY MOUTH EVERY DAY Tanda Bleacher, MD  Active Spouse/Significant Other, Pharmacy Records            Home Care and Equipment/Supplies: Were Home Health Services Ordered?: No Any new equipment or medical supplies ordered?: No  Functional Questionnaire: Do you need assistance with bathing/showering or dressing?: No Do you need assistance with meal preparation?: Yes (wife assists) Do you need assistance with eating?: No Do you have  difficulty maintaining continence: Yes Do you need assistance with getting out of bed/getting out of a chair/moving?: No Do you have difficulty managing or taking your medications?: Yes  Follow up appointments reviewed: PCP Follow-up appointment confirmed?: No (RNCM assisted with scheduling on 10/08/24 with PCP-first available for hospital follow up) MD Provider Line Number:[phone] Given: No Specialist Hospital Follow-up appointment confirmed?: Yes Date of Specialist follow-up appointment?: 10/22/24 Follow-Up Specialty Provider:: [facility]ist Do you need transportation to your follow-up appointment?: No Do you understand care options if your condition(s) worsen?: Yes-patient verbalized understanding  SDOH Interventions Today    Flowsheet Row Most Recent Value  SDOH Interventions   Food Insecurity Interventions Intervention Not Indicated  Housing Interventions Intervention Not Indicated  Transportation Interventions Intervention Not Indicated  Utilities Interventions Intervention Not Indicated    Goals Addressed             This Visit's Progress    V[facility] Transitions of Care (TOC) Care Plan       Problems:  Recent Hospitalization for treatment of CAD Knowledge Deficit Related to disease management and Medication management barrier patient's spouse has difficulty managing medications  Goal:  Over the next 30 days, the patient will not experience hospital readmission  Interventions:  Transitions of Care: Doctor Visits  - discussed the importance of doctor visits Post discharge activity limitations prescribed by provider reviewed Post-op wound/incision care reviewed with patient/caregiver Reviewed Signs and symptoms of infection Advised DPR to  inquire with insurance plan for coverage for assistance in the home Educated on compliance packaging and delivery offered by [facility] Medications reviewed, [facility]  [facility] -looks like last dispense was 90 day on 07/25/24, unsure if it was picked up Pharmacy referral placed for medication management RNCM assisted with scheduling hospital follow up with PCP Reviewed upcoming appointments including:Hospital follow up with PCP on 10/08/24, 10/15/24 with Physical medicine and Rehab and 10/22/24 with Vein and Vascular RNCM will mail a copy of this visit-DPR is unable to access patient's MyChart  Patient Self Care Activities:  Attend all scheduled provider appointments Call pharmacy for medication refills 3-7 days in advance of running out of medications Call provider office for new concerns or questions  Notify RN Care Manager of TOC call rescheduling needs Participate in Transition of Care Program/Attend TOC scheduled calls Take medications as prescribed   Work with the pharmacist to address medication management needs and will continue to work with the clinical team to address health care and disease management related needs  Plan:  Telephone follow up appointment with care management team member scheduled for:  10/01/24 at 2:30pm with [place], RN        Andrea Dimes RN, BSN [place]  [facility] RN Care Manager [phone]

## 2024-09-25 ENCOUNTER — Other Ambulatory Visit: Payer: Self-pay | Admitting: Pharmacist

## 2024-09-25 DIAGNOSIS — J439 Emphysema, unspecified: Secondary | ICD-10-CM

## 2024-09-25 MED ORDER — TRELEGY ELLIPTA 100-62.5-25 MCG/ACT IN AEPB: INHALATION_SPRAY | RESPIRATORY_TRACT | 0 refills | Status: DC

## 2024-09-25 MED ORDER — NICOTINE 14 MG/24HR TD PT24: MEDICATED_PATCH | TRANSDERMAL | 0 refills | Status: AC

## 2024-09-25 MED ORDER — QUETIAPINE FUMARATE 25 MG PO TABS: 25.0000 mg | ORAL_TABLET | Freq: Two times a day (BID) | ORAL | 0 refills | Status: DC | PRN

## 2024-09-25 MED ORDER — LISINOPRIL 20 MG PO TABS: 20.0000 mg | ORAL_TABLET | Freq: Every day | ORAL | 0 refills | Status: DC

## 2024-09-25 MED ORDER — POLYETHYLENE GLYCOL 3350 17 GM/SCOOP PO POWD: 17.0000 g | Freq: Two times a day (BID) | ORAL | 0 refills | Status: AC | PRN

## 2024-09-25 MED ORDER — FAMOTIDINE 40 MG PO TABS: 40.0000 mg | ORAL_TABLET | Freq: Every day | ORAL | 0 refills | Status: DC

## 2024-09-25 MED ORDER — ALBUTEROL SULFATE HFA 108 (90 BASE) MCG/ACT IN AERS: 1.0000 | INHALATION_SPRAY | Freq: Four times a day (QID) | RESPIRATORY_TRACT | 5 refills | Status: DC | PRN

## 2024-09-25 MED ORDER — FOLIC ACID 1 MG PO TABS: 1.0000 mg | ORAL_TABLET | Freq: Every day | ORAL | 0 refills | Status: AC

## 2024-09-25 NOTE — Progress Notes (Signed)
 09/25/2024 Name: Ronald Lowe MRN: [id number] DOB: 11-08-1956  No chief complaint on file.   Ronald Lowe is a 68 y.o. year old male recently discharged from the hospital.   They were referred to the pharmacist by the [facility] Transitions of Care program for assistance in managing medication access.   Briefly, he was admitted to [facility] 6/19 - 06/14/2024 with a CVA. Discharged to IP rehab on 06/14/24 and stayed in rehab until 06/25/2024. Pt was then admitted 10/2 - 09/20/24 for planned L carotid endarterectomy by Dr. [name].   I reviewed his hospital notes. His medication list per the hospital team should be as follows:  - Albuterol  inhaler prn - ASA - Atorvastatin  - Clopidogrel  - Cyanocobalamin  - Famotidine  - Folic acid  - Lisinopril  - MVI - Nicotine  replace, patch - Quetiapine  with instructions to taper off after IP rehab - Thiamine  - Trelegy inhaler   Subjective:  Care Team: Primary Care Provider: Tanda Bleacher, MD ; Next Scheduled Visit: 10/08/2024  Medication Access/Adherence  Current Pharmacy:  [facility]--[id number] [address],  - [address] [place] [address] Phone: [phone] Fax: [phone]  I attempted to contact the patient today. I called his number and the number of his spouse but was unable to reach either. I reviewed his medication list and correlated it with the above. Per my review, he should be all the above medications. However, he informed a [facility] nurse 09/24/24 that he needed refills of the albuterol  inhaler, folic acid , quetiapine . I have sent these to his requested pharmacy. He completed his course of thiamine  and cyanocobalamin . I have removed these two from his list. Lastly, his spouse reported that he was not taking lisinopril , NRT (patch), or the Miralax . However, I did not see a reason for this. It looks like these were never sent to his requested pharmacy. I cannot see a reason as to  why he is not taking these and I cannot get in touch with the patient. I have placed refills for these to last until he sees his PCP.    Objective:  Lab Results  Component Value Date   HGBA1C 4.2 (L) 06/07/2024    Lab Results  Component Value Date   CREATININE 1.27 (H) 09/20/2024   BUN 16 09/20/2024   NA 138 09/20/2024   K 3.7 09/20/2024   CL 104 09/20/2024   CO2 20 (L) 09/20/2024    Lab Results  Component Value Date   CHOL 121 09/20/2024   HDL 55 09/20/2024   LDLCALC 59 09/20/2024   TRIG 36 09/20/2024   CHOLHDL 2.2 09/20/2024    Medications Reviewed Today     Reviewed by Fleeta Tonia Garnette LITTIE, RPH-CPP (Pharmacist) on 09/25/24 at 1137  Med List Status: <None>   Medication Order Taking? Sig Documenting Provider Last Dose Status Informant  acetaminophen  (TYLENOL ) 325 MG tablet [id number]  Take 2 tablets (650 mg total) by mouth every 4 (four) hours as needed for mild pain (pain score 1-3) or fever (or temp > 37.5 C (99.5 F)). Angiulli, Toribio PARAS, PA-C  Active Spouse/Significant Other, Pharmacy Records  albuterol  (VENTOLIN  HFA) 108 [phone] Base) MCG/ACT inhaler [id number]  Inhale 1-2 puffs into the lungs every 6 (six) hours as needed for wheezing or shortness of breath. Newlin, Enobong, MD  Active   aspirin  EC 81 MG tablet [id number]  Take 1 tablet (81 mg total) by mouth daily. Swallow whole. Pegge Toribio PARAS, PA-C  Active Spouse/Significant  Other, Pharmacy Records  atorvastatin  (LIPITOR) 40 MG tablet [id number]  Take 1 tablet (40 mg total) by mouth daily. Bethanie Cough, PA-C  Active   clopidogrel  (PLAVIX ) 75 MG tablet [id number]  Take 1 tablet (75 mg total) by mouth daily. [name] [name], MD  Active Spouse/Significant Other, Pharmacy Records   Patient not taking:   Discontinued 09/25/24 1132 (Completed Course) famotidine  (PEPCID ) 40 MG tablet [id number]  Take 1 tablet (40 mg total) by mouth daily. Pegge Toribio PARAS, PA-C  Active Spouse/Significant Other, Pharmacy Records  folic  acid (FOLVITE ) 1 MG tablet [id number]  Take 1 tablet (1 mg total) by mouth daily. Newlin, Enobong, MD  Active   HYDROcodone-acetaminophen  (NORCO/VICODIN) 5-325 MG tablet [id number]  Take 1 tablet by mouth every 6 (six) hours as needed for moderate pain (pain score 4-6). Bethanie Cough, PA-C  Active     Discontinued 09/25/24 1128 (No longer needed (for PRN medications))   Discontinued 09/25/24 1128 (No longer needed (for PRN medications)) lisinopril  (ZESTRIL ) 20 MG tablet [id number]  Take 1 tablet (20 mg total) by mouth daily. Newlin, Enobong, MD  Active   nicotine  (NICODERM CQ  - DOSED IN MG/24 HOURS) 14 mg/24hr patch [id number]  14 mg patch daily x 1 week then 7 mg patch daily x 3 weeks and stop Newlin, Enobong, MD  Active   polyethylene glycol powder (GLYCOLAX /MIRALAX ) 17 GM/SCOOP powder [id number]  Take 17 g by mouth 2 (two) times daily as needed. Newlin, Enobong, MD  Active   QUEtiapine  (SEROQUEL ) 25 MG tablet [id number]  Take 1 tablet (25 mg total) by mouth 2 (two) times daily as needed (aggitation). Newlin, Enobong, MD  Active    Patient not taking:   Discontinued 09/25/24 1132 (Completed Course) traZODone  (DESYREL ) 50 MG tablet [id number]  Take 1 tablet (50 mg total) by mouth at bedtime as needed for sleep. Pegge Toribio PARAS, PA-C  Active Spouse/Significant Other, Pharmacy Records  TRELEGY ELLIPTA  100-62.5-25 MCG/ACT AEPB [id number]  INHALE 1 PUFF BY MOUTH EVERY DAY Tanda Bleacher, MD  Active Spouse/Significant Other, Pharmacy Records              Assessment/Plan:   Medication Management: - Rxns reviewed. I have refilled what is needed and sent to [facility].  - Attempted to discuss medications with patient and his spouse but they did not answer. - Completed rxns and prn medications for past use have been removed from his profile.  - He sees his PCP in 2 weeks.   Herlene Fleeta Morris, PharmD, JAQUELINE, CPP Clinical Pharmacist [facility] [phone]

## 2024-10-01 ENCOUNTER — Telehealth: Payer: Self-pay

## 2024-10-08 ENCOUNTER — Encounter: Payer: Self-pay | Admitting: Family Medicine

## 2024-10-08 ENCOUNTER — Ambulatory Visit: Admitting: Family Medicine

## 2024-10-08 VITALS — BP 142/91 | HR 91 | Ht 72.0 in | Wt 150.6 lb

## 2024-10-08 DIAGNOSIS — I10 Essential (primary) hypertension: Secondary | ICD-10-CM | POA: Diagnosis not present

## 2024-10-08 DIAGNOSIS — K219 Gastro-esophageal reflux disease without esophagitis: Secondary | ICD-10-CM

## 2024-10-08 DIAGNOSIS — J439 Emphysema, unspecified: Secondary | ICD-10-CM

## 2024-10-08 DIAGNOSIS — I6930 Unspecified sequelae of cerebral infarction: Secondary | ICD-10-CM

## 2024-10-08 DIAGNOSIS — G4700 Insomnia, unspecified: Secondary | ICD-10-CM

## 2024-10-08 DIAGNOSIS — R059 Cough, unspecified: Secondary | ICD-10-CM

## 2024-10-08 DIAGNOSIS — F17200 Nicotine dependence, unspecified, uncomplicated: Secondary | ICD-10-CM

## 2024-10-08 MED ORDER — CLOPIDOGREL BISULFATE 75 MG PO TABS: 75.0000 mg | ORAL_TABLET | Freq: Every day | ORAL | 0 refills | Status: DC

## 2024-10-08 MED ORDER — ALBUTEROL SULFATE HFA 108 (90 BASE) MCG/ACT IN AERS: 1.0000 | INHALATION_SPRAY | Freq: Four times a day (QID) | RESPIRATORY_TRACT | 1 refills | Status: DC | PRN

## 2024-10-08 MED ORDER — TRELEGY ELLIPTA 100-62.5-25 MCG/ACT IN AEPB: INHALATION_SPRAY | RESPIRATORY_TRACT | 0 refills | Status: DC

## 2024-10-08 MED ORDER — FAMOTIDINE 40 MG PO TABS: 40.0000 mg | ORAL_TABLET | Freq: Every day | ORAL | 0 refills | Status: AC

## 2024-10-08 MED ORDER — QUETIAPINE FUMARATE 25 MG PO TABS: 25.0000 mg | ORAL_TABLET | Freq: Two times a day (BID) | ORAL | 1 refills | Status: DC | PRN

## 2024-10-08 MED ORDER — ATORVASTATIN CALCIUM 40 MG PO TABS: 40.0000 mg | ORAL_TABLET | Freq: Every day | ORAL | 0 refills | Status: DC

## 2024-10-08 MED ORDER — TRAZODONE HCL 50 MG PO TABS: 50.0000 mg | ORAL_TABLET | Freq: Every evening | ORAL | 1 refills | Status: AC | PRN

## 2024-10-08 MED ORDER — LISINOPRIL 20 MG PO TABS: 20.0000 mg | ORAL_TABLET | Freq: Every day | ORAL | 0 refills | Status: DC

## 2024-10-08 NOTE — Progress Notes (Unsigned)
 Established Patient Office Visit  Subjective    Patient ID: Ronald Lowe, male    DOB: October 05, 1956  Age: 68 y.o. MRN: [id number]  CC:  Chief Complaint  Patient presents with   Hospitalization Follow-up    Pt's wife would like all of his medications sorted out Pt has been losing weight  Pt has had a bad cough for a week     HPI Ronald Lowe presents with his wife for follow up after hospital discharge. Patient had a CVA related to left sided ICA stenosis. Wife reports that he has some memory deficits since the stroke and that it is increasingly difficult to care for him.  Outpatient Encounter Medications as of 10/08/2024  Medication Sig   aspirin  EC 81 MG tablet Take 1 tablet (81 mg total) by mouth daily. Swallow whole.   folic acid  (FOLVITE ) 1 MG tablet Take 1 tablet (1 mg total) by mouth daily.   HYDROcodone-acetaminophen  (NORCO/VICODIN) 5-325 MG tablet Take 1 tablet by mouth every 6 (six) hours as needed for moderate pain (pain score 4-6).   nicotine  (NICODERM CQ  - DOSED IN MG/24 HOURS) 14 mg/24hr patch 14 mg patch daily x 1 week then 7 mg patch daily x 3 weeks and stop   polyethylene glycol powder (GLYCOLAX /MIRALAX ) 17 GM/SCOOP powder Take 17 g by mouth 2 (two) times daily as needed.   [DISCONTINUED] albuterol  (VENTOLIN  HFA) 108 (90 Base) MCG/ACT inhaler Inhale 1-2 puffs into the lungs every 6 (six) hours as needed for wheezing or shortness of breath.   [DISCONTINUED] atorvastatin  (LIPITOR) 40 MG tablet Take 1 tablet (40 mg total) by mouth daily.   [DISCONTINUED] clopidogrel  (PLAVIX ) 75 MG tablet Take 1 tablet (75 mg total) by mouth daily.   [DISCONTINUED] famotidine  (PEPCID ) 40 MG tablet Take 1 tablet (40 mg total) by mouth daily.   [DISCONTINUED] lisinopril  (ZESTRIL ) 20 MG tablet Take 1 tablet (20 mg total) by mouth daily.   [DISCONTINUED] QUEtiapine  (SEROQUEL ) 25 MG tablet Take 1 tablet (25 mg total) by mouth 2 (two) times daily as needed (aggitation).   [DISCONTINUED]  traZODone  (DESYREL ) 50 MG tablet Take 1 tablet (50 mg total) by mouth at bedtime as needed for sleep.   [DISCONTINUED] TRELEGY ELLIPTA  100-62.5-25 MCG/ACT AEPB INHALE 1 PUFF BY MOUTH EVERY DAY   acetaminophen  (TYLENOL ) 325 MG tablet Take 2 tablets (650 mg total) by mouth every 4 (four) hours as needed for mild pain (pain score 1-3) or fever (or temp > 37.5 C (99.5 F)).   albuterol  (VENTOLIN  HFA) 108 (90 Base) MCG/ACT inhaler Inhale 1-2 puffs into the lungs every 6 (six) hours as needed for wheezing or shortness of breath.   atorvastatin  (LIPITOR) 40 MG tablet Take 1 tablet (40 mg total) by mouth daily.   clopidogrel  (PLAVIX ) 75 MG tablet Take 1 tablet (75 mg total) by mouth daily.   famotidine  (PEPCID ) 40 MG tablet Take 1 tablet (40 mg total) by mouth daily.   lisinopril  (ZESTRIL ) 20 MG tablet Take 1 tablet (20 mg total) by mouth daily.   QUEtiapine  (SEROQUEL ) 25 MG tablet Take 1 tablet (25 mg total) by mouth 2 (two) times daily as needed (aggitation).   traZODone  (DESYREL ) 50 MG tablet Take 1 tablet (50 mg total) by mouth at bedtime as needed for sleep.   TRELEGY ELLIPTA  100-62.5-25 MCG/ACT AEPB INHALE 1 PUFF BY MOUTH EVERY DAY   No facility-administered encounter medications on file as of 10/08/2024.    Past Medical History:  Diagnosis Date  Aortic atherosclerosis    Cervical spinal stenosis    Chronic kidney disease    COPD (chronic obstructive pulmonary disease) (HCC)    Coronary artery calcification seen on CT scan    ETOH abuse    a.) 28+ standard drinks/week   GERD (gastroesophageal reflux disease)    History of amiodarone  therapy    a.) in the past; self discontinued   History of medication noncompliance    a.) reported 10/2022 that he has been off NOAC for several months secondary to vertiginous symptoms, not acting like himself, and depression; b.) off/not taking as prescribed: antiarrhythmics, antihypertensives, statins, and MDIs   Hyperlipidemia    Hypertension    Long  term current use of anticoagulant    a.) rivaroxaban    PAF (paroxysmal atrial fibrillation) (HCC)    a.) CHA2DS2VASc = 3 (age, HTN, vascular disease history);  b.) rate/rhythm maintained on oral carvediolol (self discontinued diltiazem  + amiodarone  + metoprolol ); chronically anticoagulated with rivaroxaban  (switched from apixaban  11/17/2022)   Stroke [facility])    Tobacco abuse     Past Surgical History:  Procedure Laterality Date   ENDARTERECTOMY Left 09/19/2024   Procedure: ENDARTERECTOMY, CAROTID;  Surgeon: [name] [name], MD;  Location: [facility] OR;  Service: Vascular;  Laterality: Left;   PATCH ANGIOPLASTY Left 09/19/2024   Procedure: ANGIOPLASTY, USING PATCH GRAFT BOVINE 1CMX6CM PATCH;  Surgeon: [name] [name], MD;  Location: [facility] OR;  Service: Vascular;  Laterality: Left;   SHOULDER ARTHROSCOPY WITH SUBACROMIAL DECOMPRESSION, ROTATOR CUFF REPAIR AND BICEP TENDON REPAIR Left 12/07/2022   Procedure: SHOULDER ARTHROSCOPY WITH DEBRIDEMENT, DECOMPRESSION, ROTATOR CUFF REPAIR AND BICEPS TENODESIS.- RNFA;  Surgeon: Edie Norleen PARAS, MD;  Location: [facility] ORS;  Service: Orthopedics;  Laterality: Left;   SHOULDER ARTHROSCOPY WITH SUBACROMIAL DECOMPRESSION, ROTATOR CUFF REPAIR AND BICEP TENDON REPAIR Left 02/02/2023   Procedure: LEFT SHOULDER ARTHROSCOPY WITH DEBRIDEMENT AND REPAIR OF RECURRENT ROTATOR CUFF TEAR OF LEFT SHOULDER;  Surgeon: Edie Norleen PARAS, MD;  Location: [facility] ORS;  Service: Orthopedics;  Laterality: Left;   TONSILLECTOMY      Family History  Problem Relation Age of Onset   Emphysema Father    CAD Father        CABG   Stroke Mother    CAD Brother 1       CABG   Hypertension Brother     Social History   Socioeconomic History   Marital status: Married    Spouse name: [name] Irving   Number of children: 2   Years of education: Not on file   Highest education level: Not on file  Occupational History   Occupation: retired  Tobacco Use   Smoking status: Every Day    Current packs/day:  0.25    Average packs/day: 0.3 packs/day for 53.8 years (13.5 ttl pk-yrs)    Types: Cigarettes    Start date: 12/19/1970   Smokeless tobacco: Former   Tobacco comments:    Smokes 3 packs of cigarettes a week. 06/30/23 Tay  Vaping Use   Vaping status: Never Used  Substance and Sexual Activity   Alcohol use: Yes    Alcohol/week: 35.0 standard drinks of alcohol    Types: 14 Cans of beer, 21 Shots of liquor per week   Drug use: Not Currently    Types: Marijuana    Comment: last marijuana 05/2024   Sexual activity: Yes    Partners: Female    Birth control/protection: None  Other Topics Concern   Not on file  Social History  Narrative   Lives with wife.   Social Drivers of Corporate investment banker Strain: Low Risk  (09/29/2023)   Overall Financial Resource Strain (CARDIA)    Difficulty of Paying Living Expenses: Not hard at all  Food Insecurity: No Food Insecurity (09/24/2024)   Hunger Vital Sign    Worried About Running Out of Food in the Last Year: Never true    Ran Out of Food in the Last Year: Never true  Transportation Needs: No Transportation Needs (09/24/2024)   PRAPARE - Administrator, Civil Service (Medical): No    Lack of Transportation (Non-Medical): No  Physical Activity: Inactive (09/29/2023)   Exercise Vital Sign    Days of Exercise per Week: 0 days    Minutes of Exercise per Session: 0 min  Stress: No Stress Concern Present (09/29/2023)   [employer] of Occupational Health - Occupational Stress Questionnaire    Feeling of Stress : Not at all  Social Connections: Moderately Integrated (09/29/2023)   Social Connection and Isolation Panel    Frequency of Communication with Friends and Family: More than three times a week    Frequency of Social Gatherings with Friends and Family: More than three times a week    Attends Religious Services: More than 4 times per year    Active Member of [employer] or Organizations: No    Attends Banker  Meetings: Never    Marital Status: Married  Catering manager Violence: Patient Unable To Answer (09/24/2024)   Humiliation, Afraid, Rape, and Kick questionnaire    Fear of Current or Ex-Partner: Patient unable to answer    Emotionally Abused: Patient unable to answer    Physically Abused: Patient unable to answer    Sexually Abused: Patient unable to answer    Review of Systems  Respiratory:  Positive for cough.   Psychiatric/Behavioral:  Positive for memory loss. The patient has insomnia.   All other systems reviewed and are negative.       Objective    BP (!) 142/91   Pulse 91   Ht 6' (1.829 m)   Wt 150 lb 9.6 oz (68.3 kg)   SpO2 92%   BMI 20.43 kg/m   Physical Exam Vitals and nursing note reviewed.  Constitutional:      General: He is not in acute distress. Cardiovascular:     Rate and Rhythm: Normal rate and regular rhythm.  Pulmonary:     Effort: Pulmonary effort is normal.     Breath sounds: Normal breath sounds.  Abdominal:     Palpations: Abdomen is soft.     Tenderness: There is no abdominal tenderness.  Neurological:     General: No focal deficit present.     Mental Status: He is alert and oriented to person, place, and time.  Psychiatric:        Mood and Affect: Mood normal.        Behavior: Behavior is cooperative.         Assessment & Plan:   Cough, unspecified type  Pulmonary emphysema (HCC) -     Albuterol  Sulfate HFA; Inhale 1-2 puffs into the lungs every 6 (six) hours as needed for wheezing or shortness of breath.  Dispense: 6.7 g; Refill: 1 -     Trelegy Ellipta ; INHALE 1 PUFF BY MOUTH EVERY DAY  Dispense: 180 each; Refill: 0  History of CVA with residual deficit  Elevated blood pressure reading in office with diagnosis of hypertension  Gastroesophageal  reflux disease, unspecified whether esophagitis present  Insomnia, unspecified type  Tobacco dependence  Other orders -     traZODone  HCl; Take 1 tablet (50 mg total) by mouth at  bedtime as needed for sleep.  Dispense: 30 tablet; Refill: 1 -     QUEtiapine  Fumarate; Take 1 tablet (25 mg total) by mouth 2 (two) times daily as needed (aggitation).  Dispense: 30 tablet; Refill: 1 -     Lisinopril ; Take 1 tablet (20 mg total) by mouth daily.  Dispense: 30 tablet; Refill: 0 -     Clopidogrel  Bisulfate; Take 1 tablet (75 mg total) by mouth daily.  Dispense: 30 tablet; Refill: 0 -     Famotidine ; Take 1 tablet (40 mg total) by mouth daily.  Dispense: 30 tablet; Refill: 0 -     Atorvastatin  Calcium ; Take 1 tablet (40 mg total) by mouth daily.  Dispense: 30 tablet; Refill: 0     No follow-ups on file.   Tanda Raguel SQUIBB, MD

## 2024-10-10 DIAGNOSIS — D6869 Other thrombophilia: Secondary | ICD-10-CM | POA: Diagnosis not present

## 2024-10-10 DIAGNOSIS — F1510 Other stimulant abuse, uncomplicated: Secondary | ICD-10-CM | POA: Diagnosis not present

## 2024-10-10 DIAGNOSIS — N189 Chronic kidney disease, unspecified: Secondary | ICD-10-CM | POA: Diagnosis not present

## 2024-10-10 DIAGNOSIS — K219 Gastro-esophageal reflux disease without esophagitis: Secondary | ICD-10-CM | POA: Diagnosis not present

## 2024-10-10 DIAGNOSIS — Z818 Family history of other mental and behavioral disorders: Secondary | ICD-10-CM | POA: Diagnosis not present

## 2024-10-10 DIAGNOSIS — I700 Atherosclerosis of aorta: Secondary | ICD-10-CM | POA: Diagnosis not present

## 2024-10-10 DIAGNOSIS — Z8701 Personal history of pneumonia (recurrent): Secondary | ICD-10-CM | POA: Diagnosis not present

## 2024-10-10 DIAGNOSIS — F17210 Nicotine dependence, cigarettes, uncomplicated: Secondary | ICD-10-CM | POA: Diagnosis not present

## 2024-10-10 DIAGNOSIS — G4733 Obstructive sleep apnea (adult) (pediatric): Secondary | ICD-10-CM | POA: Diagnosis not present

## 2024-10-10 DIAGNOSIS — Z8249 Family history of ischemic heart disease and other diseases of the circulatory system: Secondary | ICD-10-CM | POA: Diagnosis not present

## 2024-10-10 DIAGNOSIS — F411 Generalized anxiety disorder: Secondary | ICD-10-CM | POA: Diagnosis not present

## 2024-10-10 DIAGNOSIS — M4800 Spinal stenosis, site unspecified: Secondary | ICD-10-CM | POA: Diagnosis not present

## 2024-10-10 DIAGNOSIS — I509 Heart failure, unspecified: Secondary | ICD-10-CM | POA: Diagnosis not present

## 2024-10-10 DIAGNOSIS — E1122 Type 2 diabetes mellitus with diabetic chronic kidney disease: Secondary | ICD-10-CM | POA: Diagnosis not present

## 2024-10-10 DIAGNOSIS — I672 Cerebral atherosclerosis: Secondary | ICD-10-CM | POA: Diagnosis not present

## 2024-10-10 DIAGNOSIS — I130 Hypertensive heart and chronic kidney disease with heart failure and stage 1 through stage 4 chronic kidney disease, or unspecified chronic kidney disease: Secondary | ICD-10-CM | POA: Diagnosis not present

## 2024-10-10 DIAGNOSIS — F1220 Cannabis dependence, uncomplicated: Secondary | ICD-10-CM | POA: Diagnosis not present

## 2024-10-10 DIAGNOSIS — F1020 Alcohol dependence, uncomplicated: Secondary | ICD-10-CM | POA: Diagnosis not present

## 2024-10-10 DIAGNOSIS — Z7982 Long term (current) use of aspirin: Secondary | ICD-10-CM | POA: Diagnosis not present

## 2024-10-10 DIAGNOSIS — I69319 Unspecified symptoms and signs involving cognitive functions following cerebral infarction: Secondary | ICD-10-CM | POA: Diagnosis not present

## 2024-10-10 DIAGNOSIS — J439 Emphysema, unspecified: Secondary | ICD-10-CM | POA: Diagnosis not present

## 2024-10-10 DIAGNOSIS — Z85528 Personal history of other malignant neoplasm of kidney: Secondary | ICD-10-CM | POA: Diagnosis not present

## 2024-10-10 DIAGNOSIS — E1144 Type 2 diabetes mellitus with diabetic amyotrophy: Secondary | ICD-10-CM | POA: Diagnosis not present

## 2024-10-10 DIAGNOSIS — I2510 Atherosclerotic heart disease of native coronary artery without angina pectoris: Secondary | ICD-10-CM | POA: Diagnosis not present

## 2024-10-10 DIAGNOSIS — I4891 Unspecified atrial fibrillation: Secondary | ICD-10-CM | POA: Diagnosis not present

## 2024-10-11 ENCOUNTER — Telehealth: Payer: Self-pay | Admitting: *Deleted

## 2024-10-11 NOTE — Patient Instructions (Signed)
 Visit Information  Thank you for taking time to visit with me today. Please don't hesitate to contact me if I can be of assistance to you before our next scheduled telephone appointment.   Following is a copy of your care plan:   Goals Addressed             This Visit's Progress    [facility]I Transitions of Care (TOC) Care Plan       Problems:  Recent Hospitalization for treatment of CAD Knowledge Deficit Related to disease management and Medication management barrier patient's spouse has difficulty managing medications-today DPR reports having a better understanding of Medications  Goal:  Over the next 30 days, the patient will not experience hospital readmission  Interventions:  Transitions of Care: Doctor Visits  - discussed the importance of doctor visits Post discharge activity limitations prescribed by provider reviewed Post-op wound/incision care reviewed with patient/caregiver Reviewed Signs and symptoms of infection Advised DPR to inquire with insurance plan for coverage for assistance in the home-will revisit on 10/16/24 Medications reviewed, DPR reports having all medications and has a better understanding Pharmacy referral placed for medication management-pharmacy was unable to reach patient, per note all medications were reviewed and refills sent, DPR denies needing follow up Reviewed upcoming appointments including:10/15/24 with Physical medicine and Rehab and 10/22/24 with Vein and Vascular RNCM will mail a copy of this visit-DPR is unable to access patient's MyChart  Patient Self Care Activities:  Attend all scheduled provider appointments Call pharmacy for medication refills 3-7 days in advance of running out of medications Call provider office for new concerns or questions  Notify RN Care Manager of [facility] call rescheduling needs Participate in Transition of Care Program/Attend TOC scheduled calls Take medications as prescribed   Work with the pharmacist to address  medication management needs and will continue to work with the clinical team to address health care and disease management related needs  Plan:  Telephone follow up appointment with care management team member scheduled for:  10/16/24 at 3:30 pm         The patient verbalized understanding of instructions, educational materials, and care plan provided today and agreed to receive a mailed copy of patient instructions, educational materials, and care plan.   Telephone follow up appointment with care management team member scheduled for:10/16/24 at 3:30pm  Please call the care guide team at [phone] if you need to cancel or reschedule your appointment.   Please call 1-800-273-TALK (toll free, 24 hour hotline) go to [facility] Urgent [facility] [address], [place] [phone]) call 911 if you are experiencing a Mental Health or Behavioral Health Crisis or need someone to talk to.  Andrea Dimes RN, BSN [place]  [facility] RN Care Manager [phone]

## 2024-10-11 NOTE — Transitions of Care (Post Inpatient/ED Visit) (Signed)
 Transition of Care week 2  Visit Note  10/11/2024  Name: Ronald Lowe MRN: [id number]          DOB: 26-Apr-1956  Situation: Patient enrolled in [facility] 30-day program. Visit completed with DPR/[name] by telephone.   Background:   Initial Transition Care Management Follow-up Telephone Call Discharge Date and Diagnosis: 09/20/24, Carotid Endarterectomy   Past Medical History:  Diagnosis Date   Aortic atherosclerosis    Cervical spinal stenosis    Chronic kidney disease    COPD (chronic obstructive pulmonary disease) (HCC)    Coronary artery calcification seen on CT scan    ETOH abuse    a.) 28+ standard drinks/week   GERD (gastroesophageal reflux disease)    History of amiodarone  therapy    a.) in the past; self discontinued   History of medication noncompliance    a.) reported 10/2022 that he has been off NOAC for several months secondary to vertiginous symptoms, not acting like himself, and depression; b.) off/not taking as prescribed: antiarrhythmics, antihypertensives, statins, and MDIs   Hyperlipidemia    Hypertension    Long term current use of anticoagulant    a.) rivaroxaban    PAF (paroxysmal atrial fibrillation) (HCC)    a.) CHA2DS2VASc = 3 (age, HTN, vascular disease history);  b.) rate/rhythm maintained on oral carvediolol (self discontinued diltiazem  + amiodarone  + metoprolol ); chronically anticoagulated with rivaroxaban  (switched from apixaban  11/17/2022)   Stroke (HCC)    Tobacco abuse     Assessment: Patient Reported Symptoms: Cognitive Cognitive Status: Able to follow simple commands, Alert and oriented to person, place, and time, Normal speech and language skills (DPR reports improvement with patient's memory)      Neurological Neurological Review of Symptoms: No symptoms reported Neurological Management Strategies: Routine screening, Coping strategies, Adequate rest Neurological Self-Management Outcome: 4 (good) Neurological Comment: Scheduled with  Neurology in January  HEENT HEENT Symptoms Reported: Not assessed      Cardiovascular Cardiovascular Symptoms Reported: No symptoms reported (Per DPR)    Respiratory Respiratory Symptoms Reported: Shortness of breath Other Respiratory Symptoms: SOB on exertion Respiratory Management Strategies: Routine screening, Adequate rest, Coping strategies Respiratory Self-Management Outcome: 3 (uncertain)  Endocrine Endocrine Symptoms Reported: No symptoms reported    Gastrointestinal Gastrointestinal Symptoms Reported: No symptoms reported      Genitourinary Genitourinary Symptoms Reported: No symptoms reported    Integumentary Integumentary Symptoms Reported: No symptoms reported    Musculoskeletal Musculoskelatal Symptoms Reviewed: Weakness   Falls in the past year?: Yes Number of falls in past year: 2 or more Was there an injury with Fall?: No Fall Risk Category Calculator: 2 Patient Fall Risk Level: Moderate Fall Risk    Psychosocial Psychosocial Symptoms Reported: No symptoms reported (per DPR)         There were no vitals filed for this visit.  Medications Reviewed Today     Reviewed by Lucky Andrea LABOR, RN (Registered Nurse) on 10/11/24 at 1516  Med List Status: <None>   Medication Order Taking? Sig Documenting Provider Last Dose Status Informant  acetaminophen  (TYLENOL ) 325 MG tablet [id number]  Take 2 tablets (650 mg total) by mouth every 4 (four) hours as needed for mild pain (pain score 1-3) or fever (or temp > 37.5 C (99.5 F)).  Patient not taking: Reported on 10/11/2024   Pegge Toribio PARAS, PA-C  Active Spouse/Significant Other, Pharmacy Records  albuterol  (VENTOLIN  HFA) 108 [phone] Base) MCG/ACT inhaler [id number] Yes Inhale 1-2 puffs into the lungs every 6 (six) hours as needed for  wheezing or shortness of breath. Tanda Bleacher, MD  Active   aspirin  EC 81 MG tablet [id number] Yes Take 1 tablet (81 mg total) by mouth daily. Swallow whole. Pegge Toribio PARAS, PA-C  Active  Spouse/Significant Other, Pharmacy Records  atorvastatin  (LIPITOR) 40 MG tablet [id number] Yes Take 1 tablet (40 mg total) by mouth daily. Tanda Bleacher, MD  Active   clopidogrel  (PLAVIX ) 75 MG tablet [id number] Yes Take 1 tablet (75 mg total) by mouth daily. Tanda Bleacher, MD  Active   famotidine  (PEPCID ) 40 MG tablet [id number] Yes Take 1 tablet (40 mg total) by mouth daily. Tanda Bleacher, MD  Active   folic acid  (FOLVITE ) 1 MG tablet [id number]  Take 1 tablet (1 mg total) by mouth daily.  Patient not taking: Reported on 10/11/2024   Newlin, Enobong, MD  Active   HYDROcodone-acetaminophen  (NORCO/VICODIN) 5-325 MG tablet [id number]  Take 1 tablet by mouth every 6 (six) hours as needed for moderate pain (pain score 4-6).  Patient not taking: Reported on 10/11/2024   Bethanie Cough, PA-C  Active   ibuprofen (ADVIL) 200 MG tablet [id number] Yes Take 600 mg by mouth every 6 (six) hours as needed. [provider]  Active   lisinopril  (ZESTRIL ) 20 MG tablet [id number] Yes Take 1 tablet (20 mg total) by mouth daily. Tanda Bleacher, MD  Active   nicotine  (NICODERM CQ  - DOSED IN MG/24 HOURS) 14 mg/24hr patch [id number]  14 mg patch daily x 1 week then 7 mg patch daily x 3 weeks and stop  Patient not taking: Reported on 10/11/2024   Newlin, Enobong, MD  Active   polyethylene glycol powder (GLYCOLAX /MIRALAX ) 17 GM/SCOOP powder [id number]  Take 17 g by mouth 2 (two) times daily as needed.  Patient not taking: Reported on 10/11/2024   Newlin, Enobong, MD  Active   QUEtiapine  (SEROQUEL ) 25 MG tablet [id number] Yes Take 1 tablet (25 mg total) by mouth 2 (two) times daily as needed (aggitation). Tanda Bleacher, MD  Active   traZODone  (DESYREL ) 50 MG tablet [id number] Yes Take 1 tablet (50 mg total) by mouth at bedtime as needed for sleep. Tanda Bleacher, MD  Active   TRELEGY ELLIPTA  100-62.5-25 MCG/ACT AEPB [id number] Yes INHALE 1 PUFF BY MOUTH EVERY DAY Tanda Bleacher, MD  Active              Recommendation:   Continue Current Plan of Care  Follow Up Plan:   Telephone follow-up in 1 week  Andrea Dimes RN, BSN [place]  Value-Based [facility] RN Care Manager [phone]

## 2024-10-15 ENCOUNTER — Encounter: Attending: Physical Medicine & Rehabilitation | Admitting: Psychology

## 2024-10-15 DIAGNOSIS — I69319 Unspecified symptoms and signs involving cognitive functions following cerebral infarction: Secondary | ICD-10-CM | POA: Insufficient documentation

## 2024-10-15 DIAGNOSIS — I63512 Cerebral infarction due to unspecified occlusion or stenosis of left middle cerebral artery: Secondary | ICD-10-CM | POA: Insufficient documentation

## 2024-10-16 ENCOUNTER — Encounter: Payer: Self-pay | Admitting: *Deleted

## 2024-10-16 ENCOUNTER — Telehealth: Payer: Self-pay | Admitting: *Deleted

## 2024-10-17 ENCOUNTER — Ambulatory Visit: Admitting: Family Medicine

## 2024-10-17 ENCOUNTER — Encounter: Payer: Self-pay | Admitting: *Deleted

## 2024-10-18 ENCOUNTER — Encounter: Payer: Self-pay | Admitting: *Deleted

## 2024-10-22 ENCOUNTER — Observation Stay

## 2024-11-21 ENCOUNTER — Telehealth: Payer: Self-pay

## 2024-11-21 ENCOUNTER — Other Ambulatory Visit: Payer: Self-pay | Admitting: Family Medicine

## 2024-11-21 NOTE — Telephone Encounter (Signed)
 Copied from CRM [id number]. Topic: Medicare AWV >> Nov 21, 2024 11:26 AM Grayce DEL wrote: 12(4/25 Left message asking pt to call to schedule AWV  Last date of AWV: 09/29/23  Please schedule an appointment at any time with [facility] >> Nov 21, 2024  2:11 PM Santiya F wrote: Patient's wife is calling in returning a call from the office. Attempted to schedule AWV but had some issues with the scheduler. She is requesting someone give her a call back to get the AWV scheduled.

## 2024-11-26 ENCOUNTER — Ambulatory Visit: Attending: Vascular Surgery | Admitting: Physician Assistant

## 2024-11-26 VITALS — BP 135/97 | HR 98 | Temp 98.3°F | Wt 151.2 lb

## 2024-11-26 DIAGNOSIS — I6522 Occlusion and stenosis of left carotid artery: Secondary | ICD-10-CM

## 2024-11-26 NOTE — Progress Notes (Signed)
 POST OPERATIVE OFFICE NOTE    CC:  F/u for surgery  HPI:  This is a 68 y.o. male who is s/p left carotid endarterectomy on 09/19/24 by Dr. [name]. This was for symptomatic left ICA stenosis. He tolerated the surgery well and was discharged home POD#1.  Pt returns today for follow up with his wife.  Pt states overall he is doing well. Says he feels a little fullness in his neck but no pain. He denies any visual changes, trouble swallowing, slurred speech, facial drooping, unilateral upper or lower extremity weakness or numbness. His wife has concerns about his progressive memory loss. He apparently has strong family history of Alzheimer's. She explains that he is becoming very challenging for her to care for him especially because he is up most of the night. They had an appointment with Dr. Hayden, a Neuropsychologist, but she says she had to cancel the appointment because at the time she was overwhelmed with all the appointments he had. She is hopeful to get some help. He is medically managed on Aspirin , Statin, Plavix    No Known Allergies  Current Outpatient Medications  Medication Sig Dispense Refill   acetaminophen  (TYLENOL ) 325 MG tablet Take 2 tablets (650 mg total) by mouth every 4 (four) hours as needed for mild pain (pain score 1-3) or fever (or temp > 37.5 C (99.5 F)). (Patient not taking: Reported on 10/11/2024)     albuterol  (VENTOLIN  HFA) 108 (90 Base) MCG/ACT inhaler Inhale 1-2 puffs into the lungs every 6 (six) hours as needed for wheezing or shortness of breath. 6.7 g 1   aspirin  EC 81 MG tablet Take 1 tablet (81 mg total) by mouth daily. Swallow whole.     atorvastatin  (LIPITOR) 40 MG tablet Take 1 tablet (40 mg total) by mouth daily. 30 tablet 0   clopidogrel  (PLAVIX ) 75 MG tablet TAKE 1 TABLET (75 MG TOTAL) BY MOUTH DAILY. 30 tablet 0   famotidine  (PEPCID ) 40 MG tablet Take 1 tablet (40 mg total) by mouth daily. 30 tablet 0   folic acid  (FOLVITE ) 1 MG tablet Take 1 tablet (1 mg  total) by mouth daily. (Patient not taking: Reported on 10/11/2024) 30 tablet 0   HYDROcodone -acetaminophen  (NORCO/VICODIN) 5-325 MG tablet Take 1 tablet by mouth every 6 (six) hours as needed for moderate pain (pain score 4-6). (Patient not taking: Reported on 10/11/2024) 15 tablet 0   ibuprofen (ADVIL) 200 MG tablet Take 600 mg by mouth every 6 (six) hours as needed.     lisinopril  (ZESTRIL ) 20 MG tablet Take 1 tablet (20 mg total) by mouth daily. 30 tablet 0   nicotine  (NICODERM CQ  - DOSED IN MG/24 HOURS) 14 mg/24hr patch 14 mg patch daily x 1 week then 7 mg patch daily x 3 weeks and stop (Patient not taking: Reported on 10/11/2024) 28 patch 0   polyethylene glycol powder (GLYCOLAX /MIRALAX ) 17 GM/SCOOP powder Take 17 g by mouth 2 (two) times daily as needed. (Patient not taking: Reported on 10/11/2024) 3350 g 0   QUEtiapine  (SEROQUEL ) 25 MG tablet Take 1 tablet (25 mg total) by mouth 2 (two) times daily as needed (aggitation). 30 tablet 1   traZODone  (DESYREL ) 50 MG tablet Take 1 tablet (50 mg total) by mouth at bedtime as needed for sleep. 30 tablet 1   TRELEGY ELLIPTA  100-62.5-25 MCG/ACT AEPB INHALE 1 PUFF BY MOUTH EVERY DAY 180 each 0   No current facility-administered medications for this visit.     ROS:  See HPI  Physical Exam:  Today's Vitals   11/26/24 1349 11/26/24 1351  BP: (!) 150/97 (!) 135/97  Pulse: 100 98  Temp: 98.3 F (36.8 C)   TempSrc: Temporal   Weight: 151 lb 3.2 oz (68.6 kg)   PainSc: 0-No pain    Body mass index is 20.51 kg/m.  Incision:  left neck incision has healed very well Extremities:  moving all extremities without deficits Neuro: alert and oriented. Speech coherent   Assessment/Plan:  This is a 68 y.o. male who is s/p:left carotid endarterectomy on 09/19/24 by Dr. [name]. This was for symptomatic left ICA stenosis. He has had no new TIA or stroke like symptoms. His neck incision has healed very well.  - Advised them to follow up with PCP/ Call Dr.  Woodson office to reschedule his missed appointment to discuss his memory issues -continue Aspirin , Statin, Plavix  -He will Follow up in 9 months with carotid duplex   Curry Damme, [facility] [phone]   Clinic MD:  Tomie Gaskins

## 2024-12-23 ENCOUNTER — Ambulatory Visit: Admitting: Neurology

## 2024-12-23 ENCOUNTER — Telehealth: Payer: Self-pay | Admitting: Neurology

## 2024-12-23 ENCOUNTER — Encounter: Payer: Self-pay | Admitting: Neurology

## 2024-12-23 VITALS — BP 151/90 | HR 86 | Ht 73.0 in | Wt 153.0 lb

## 2024-12-23 DIAGNOSIS — R2689 Other abnormalities of gait and mobility: Secondary | ICD-10-CM | POA: Diagnosis not present

## 2024-12-23 DIAGNOSIS — R296 Repeated falls: Secondary | ICD-10-CM | POA: Diagnosis not present

## 2024-12-23 DIAGNOSIS — R410 Disorientation, unspecified: Secondary | ICD-10-CM | POA: Diagnosis not present

## 2024-12-23 DIAGNOSIS — F1090 Alcohol use, unspecified, uncomplicated: Secondary | ICD-10-CM | POA: Diagnosis not present

## 2024-12-23 DIAGNOSIS — Z720 Tobacco use: Secondary | ICD-10-CM | POA: Diagnosis not present

## 2024-12-23 DIAGNOSIS — I63512 Cerebral infarction due to unspecified occlusion or stenosis of left middle cerebral artery: Secondary | ICD-10-CM

## 2024-12-23 MED ORDER — ROSUVASTATIN CALCIUM 10 MG PO TABS: 10.0000 mg | ORAL_TABLET | Freq: Every evening | ORAL | 3 refills | Status: AC

## 2024-12-23 NOTE — Patient Instructions (Addendum)
 Continue current medications including Aspirin  and Plavix  Will refer patient to speech therapy for his cognitive complains and word finding difficulty Referral to physical therapy for gait abnormality and recurrent falls Will switch Lipitor to Crestor  10 mg Continue follow-up PCP Return sooner if symptoms do get worse.

## 2024-12-23 NOTE — Telephone Encounter (Signed)
 Referral for speech therapy sent through [facility] to [facility]. Phone: [phone]

## 2024-12-23 NOTE — Progress Notes (Signed)
 "   [facility]  PATIENT: Ronald Lowe DOB: 09/21/1956  REQUESTING CLINICIAN: Carilyn Lowe BRAVO, MD HISTORY FROM: Patient and spouse  REASON FOR VISIT: Left hemispheric stroke    HISTORICAL  CHIEF COMPLAINT:  Chief Complaint  Patient presents with   New Patient (Initial Visit)    Room 12 With wife Internal referral for recent stroke, cognitive deficits    HISTORY OF PRESENT ILLNESS:  Discussed the use of AI scribe software for clinical note transcription with the patient, who gave verbal consent to proceed.  Ronald Lowe is a 69 year old male with a history of multiple strokes who presents with cognitive deficits and frequent falls. He is accompanied by his wife, Ronald Lowe.  He has been experiencing frequent falls since his discharge from the hospital in the summer. Dizziness, particularly when standing, affects his balance and leads to falls. His last fall occurred a few days ago. He describes his equilibrium as being affected when he stands up, and he tends to fall if he does not pace himself.  He has a history of multiple strokes affecting the left hemisphere of his brain. He experiences difficulty with memory, speech, and writing. His wife notes that he sometimes forgets things and cannot articulate or write them down.  He underwent carotid endarterectomy. He has difficulty noticing objects until they are very close to him. His wife reports that he cannot see things until they are handed to him closely.  He has a history of smoking and alcohol use. He continues to smoke, albeit less than before, and drinks vodka mixed with juice daily. His wife describes him as an alcoholic, noting that he has been drinking since his teens. He experiences neuropathy in his feet, which he describes as painful and sometimes debilitating, affecting his ability to walk.  He is currently taking Plavix  and aspirin  daily. He has been prescribed cholesterol medication, but it  causes leg aches, leading to non-compliance. His wife ensures he takes Plavix  daily.    Brief Hospital  course and summary:   Ronald Lowe is a 69 y.o. right-handed male with history significant for hypertension hepatitis C hyperlipidemia paroxysmal atrial fibrillation noncompliant with anticoagulation GERD CKD with creatinine baseline 0.94-1.53 COPD followed by pulmonary services alcohol and tobacco use.  Per chart review lives with spouse.  Modified independent prior to admission reportedly still working.  Presented 06/06/2024 with altered mental status and right side weakness.  Cranial CT scan showed subtle hypoattenuation in the anterior left insula concerning for acute infarct.  Moderate chronic microvascular ischemic disease.  CTA with occluded proximal left M3 MCA.  Critical nearly occlusive stenosis of the left carotid bifurcation.  Degree of stenosis was difficult to quantify given the degree of bulky calcific atherosclerosis but likely an 80%.  Approximately 55% stenosis of the right ICA.  Patient did not receive tPA.  MRI showed multiple acute infarcts in the left MCA and left PCA territories including left basal ganglia left posterior limb of internal capsule left anterior insula and overlying left frontal and parietal lobes in the left occipital lobe.  No mass effect.  Admission chemistries positive cocaine benzos and amphetamines alcohol negative creatinine 1.53 total bilirubin 2.0.  Echocardiogram with ejection fraction of 60 to 65% no wall motion abnormalities grade 1 diastolic dysfunction.  Vascular surgery Dr. [name] for symptomatic left carotid stenosis and at this time wished to hold on any surgical intervention until mental status improved and this could be addressed as an outpatient.  He was cleared to begin aspirin  and Plavix  for CVA prophylaxis.  Subcutaneous Lovenox  for DVT prophylaxis.  Currently on dysphagia #2 thin liquid diet.  Therapy evaluations completed due to patient decreased  functional mobility was admitted for a comprehensive rehab program.   Ronald Lowe was admitted to rehab 06/14/2024 for inpatient therapies to consist of PT, ST and OT at least three hours five days a week. Past admission physiatrist, therapy team and rehab RN have worked together to provide customized collaborative inpatient rehab.  Pertaining to patient's left MCA scattered infarct likely etiology due to symptomatic proximal ICA stenosis remained stable.  Patient would remain on aspirin  and Plavix  therapy.  Lovenox  for DVT prophylaxis.  Symptomatic left ICA stenosis follow-up outpatient Dr. [name] for possible surgical intervention.  Mood stabilization behavior with noted history of alcohol use monitoring for any signs of withdrawal enclosure bed added for patient's fall risk as well as delirium precautions with Seroquel  as indicated and trazodone  to help aid in sleep.  An enclosure bed was initially used for safety due to patient's decreased safety awareness.  He was on a dysphagia #3 thin liquid diet addressed and advanced as per speech therapy.  History of COPD with tobacco use continued inhalers NicoDerm patch added providing counts regards to cessation of nicotine  products.  Urine drug screen was positive for marijuana again receiving counsel regards to cessation of illicit drug use.  Patient with history of PAF noncompliant with anticoagulation cardiac rate controlled.  AKI/CKD chemistries remained stable follow-up outpatient.  Blood pressure is controlled and monitored patient had been on lisinopril  prior to admission follow-up outpatient.   OTHER MEDICAL CONDITIONS: Alcohol and nicotine  use, CKD, Cervical Spine stenosis, COPD, hypertension, hyperlipidemia   REVIEW OF SYSTEMS: Full 14 system review of systems performed and negative with exception of: As noted in the HPI   ALLERGIES: Allergies[1]  HOME MEDICATIONS: Outpatient Medications Prior to Visit  Medication Sig Dispense Refill    acetaminophen  (TYLENOL ) 325 MG tablet Take 2 tablets (650 mg total) by mouth every 4 (four) hours as needed for mild pain (pain score 1-3) or fever (or temp > 37.5 C (99.5 F)).     albuterol  (VENTOLIN  HFA) 108 (90 Base) MCG/ACT inhaler Inhale 1-2 puffs into the lungs every 6 (six) hours as needed for wheezing or shortness of breath. 6.7 g 1   aspirin  EC 81 MG tablet Take 1 tablet (81 mg total) by mouth daily. Swallow whole.     clopidogrel  (PLAVIX ) 75 MG tablet TAKE 1 TABLET (75 MG TOTAL) BY MOUTH DAILY. 30 tablet 0   famotidine  (PEPCID ) 40 MG tablet Take 1 tablet (40 mg total) by mouth daily. 30 tablet 0   folic acid  (FOLVITE ) 1 MG tablet Take 1 tablet (1 mg total) by mouth daily. 30 tablet 0   HYDROcodone -acetaminophen  (NORCO/VICODIN) 5-325 MG tablet Take 1 tablet by mouth every 6 (six) hours as needed for moderate pain (pain score 4-6). 15 tablet 0   ibuprofen (ADVIL) 200 MG tablet Take 600 mg by mouth every 6 (six) hours as needed.     lisinopril  (ZESTRIL ) 20 MG tablet Take 1 tablet (20 mg total) by mouth daily. 30 tablet 0   polyethylene glycol powder (GLYCOLAX /MIRALAX ) 17 GM/SCOOP powder Take 17 g by mouth 2 (two) times daily as needed. 3350 g 0   QUEtiapine  (SEROQUEL ) 25 MG tablet Take 1 tablet (25 mg total) by mouth 2 (two) times daily as needed (aggitation). 30 tablet 1   traZODone  (DESYREL ) 50  MG tablet Take 1 tablet (50 mg total) by mouth at bedtime as needed for sleep. 30 tablet 1   TRELEGY ELLIPTA  100-62.5-25 MCG/ACT AEPB INHALE 1 PUFF BY MOUTH EVERY DAY 180 each 0   atorvastatin  (LIPITOR) 40 MG tablet Take 1 tablet (40 mg total) by mouth daily. 30 tablet 0   nicotine  (NICODERM CQ  - DOSED IN MG/24 HOURS) 14 mg/24hr patch 14 mg patch daily x 1 week then 7 mg patch daily x 3 weeks and stop (Patient not taking: Reported on 12/23/2024) 28 patch 0   No facility-administered medications prior to visit.    PAST MEDICAL HISTORY: Past Medical History:  Diagnosis Date   Aortic atherosclerosis     Cervical spinal stenosis    Chronic kidney disease    COPD (chronic obstructive pulmonary disease) (HCC)    Coronary artery calcification seen on CT scan    ETOH abuse    a.) 28+ standard drinks/week   GERD (gastroesophageal reflux disease)    History of amiodarone  therapy    a.) in the past; self discontinued   History of medication noncompliance    a.) reported 10/2022 that he has been off NOAC for several months secondary to vertiginous symptoms, not acting like himself, and depression; b.) off/not taking as prescribed: antiarrhythmics, antihypertensives, statins, and MDIs   Hyperlipidemia    Hypertension    Long term current use of anticoagulant    a.) rivaroxaban    PAF (paroxysmal atrial fibrillation) (HCC)    a.) CHA2DS2VASc = 3 (age, HTN, vascular disease history);  b.) rate/rhythm maintained on oral carvediolol (self discontinued diltiazem  + amiodarone  + metoprolol ); chronically anticoagulated with rivaroxaban  (switched from apixaban  11/17/2022)   Stroke (HCC)    Tobacco abuse     PAST SURGICAL HISTORY: Past Surgical History:  Procedure Laterality Date   ENDARTERECTOMY Left 09/19/2024   Procedure: ENDARTERECTOMY, CAROTID;  Surgeon: [name] [name], MD;  Location: [facility] OR;  Service: Vascular;  Laterality: Left;   PATCH ANGIOPLASTY Left 09/19/2024   Procedure: ANGIOPLASTY, USING PATCH GRAFT BOVINE 1CMX6CM PATCH;  Surgeon: [name] [name], MD;  Location: [facility] OR;  Service: Vascular;  Laterality: Left;   SHOULDER ARTHROSCOPY WITH SUBACROMIAL DECOMPRESSION, ROTATOR CUFF REPAIR AND BICEP TENDON REPAIR Left 12/07/2022   Procedure: SHOULDER ARTHROSCOPY WITH DEBRIDEMENT, DECOMPRESSION, ROTATOR CUFF REPAIR AND BICEPS TENODESIS.- RNFA;  Surgeon: Edie Norleen PARAS, MD;  Location: [facility] ORS;  Service: Orthopedics;  Laterality: Left;   SHOULDER ARTHROSCOPY WITH SUBACROMIAL DECOMPRESSION, ROTATOR CUFF REPAIR AND BICEP TENDON REPAIR Left 02/02/2023   Procedure: LEFT SHOULDER ARTHROSCOPY WITH  DEBRIDEMENT AND REPAIR OF RECURRENT ROTATOR CUFF TEAR OF LEFT SHOULDER;  Surgeon: Edie Norleen PARAS, MD;  Location: [facility] ORS;  Service: Orthopedics;  Laterality: Left;   TONSILLECTOMY      FAMILY HISTORY: Family History  Problem Relation Age of Onset   Emphysema Father    CAD Father        CABG   Stroke Mother    CAD Brother 27       CABG   Hypertension Brother     SOCIAL HISTORY: Social History   Socioeconomic History   Marital status: Married    Spouse name: Ronald Lowe Irving   Number of children: 2   Years of education: Not on file   Highest education level: Not on file  Occupational History   Occupation: retired  Tobacco Use   Smoking status: Every Day    Current packs/day: 0.25    Average packs/day: 0.3 packs/day for 54.0 years (  13.5 ttl pk-yrs)    Types: Cigarettes    Start date: 12/19/1970   Smokeless tobacco: Former   Tobacco comments:    Smokes 3 packs of cigarettes a week. 06/30/23 Tay  Vaping Use   Vaping status: Never Used  Substance and Sexual Activity   Alcohol use: Yes    Alcohol/week: 35.0 standard drinks of alcohol    Types: 14 Cans of beer, 21 Shots of liquor per week   Drug use: Not Currently    Types: Marijuana    Comment: last marijuana 05/2024   Sexual activity: Yes    Partners: Female    Birth control/protection: None  Other Topics Concern   Not on file  Social History Narrative   Lives with wife.   Social Drivers of Health   Tobacco Use: High Risk (12/23/2024)   Patient History    Smoking Tobacco Use: Every Day    Smokeless Tobacco Use: Former    Passive Exposure: Not on Actuary Strain: Low Risk (09/29/2023)   Overall Financial Resource Strain (CARDIA)    Difficulty of Paying Living Expenses: Not hard at all  Food Insecurity: No Food Insecurity (09/24/2024)   Epic    Worried About Programme Researcher, Broadcasting/film/video in the Last Year: Never true    Ran Out of Food in the Last Year: Never true  Transportation Needs: No Transportation Needs  (09/24/2024)   Epic    Lack of Transportation (Medical): No    Lack of Transportation (Non-Medical): No  Physical Activity: Inactive (09/29/2023)   Exercise Vital Sign    Days of Exercise per Week: 0 days    Minutes of Exercise per Session: 0 min  Stress: No Stress Concern Present (09/29/2023)   [employer] of Occupational Health - Occupational Stress Questionnaire    Feeling of Stress : Not at all  Social Connections: Moderately Integrated (09/29/2023)   Social Connection and Isolation Panel    Frequency of Communication with Friends and Family: More than three times a week    Frequency of Social Gatherings with Friends and Family: More than three times a week    Attends Religious Services: More than 4 times per year    Active Member of [employer] or Organizations: No    Attends Banker Meetings: Never    Marital Status: Married  Catering Manager Violence: Patient Unable To Answer (09/24/2024)   Epic    Fear of Current or Ex-Partner: Patient unable to answer    Emotionally Abused: Patient unable to answer    Physically Abused: Patient unable to answer    Sexually Abused: Patient unable to answer  Depression (PHQ2-9): High Risk (08/22/2024)   Depression (PHQ2-9)    PHQ-2 Score: 24  Alcohol Screen: Low Risk (09/29/2023)   Alcohol Screen    Last Alcohol Screening Score (AUDIT): 4  Housing: Unknown (09/24/2024)   Epic    Unable to Pay for Housing in the Last Year: No    Number of Times Moved in the Last Year: Not on file    Homeless in the Last Year: No  Utilities: Not At Risk (09/24/2024)   Epic    Threatened with loss of utilities: No  Health Literacy: Adequate Health Literacy (09/29/2023)   B1300 Health Literacy    Frequency of need for help with medical instructions: Never    PHYSICAL EXAM  GENERAL EXAM/CONSTITUTIONAL: Vitals:  Vitals:   12/23/24 1321  BP: (!) 151/90  Pulse: 86  SpO2: 96%  Weight: 153  lb (69.4 kg)  Height: 6' 1 (1.854 m)   Body mass  index is 20.19 kg/m. Wt Readings from Last 3 Encounters:  12/23/24 153 lb (69.4 kg)  11/26/24 151 lb 3.2 oz (68.6 kg)  10/08/24 150 lb 9.6 oz (68.3 kg)   Patient is in no distress; well developed, nourished and groomed; neck is supple  MUSCULOSKELETAL: Gait, strength, tone, movements noted in Neurologic exam below  NEUROLOGIC: MENTAL STATUS:      No data to display         awake, alert, oriented to person, place and time recent and remote memory intact normal attention and concentration language fluent, comprehension intact, naming intact fund of knowledge appropriate  CRANIAL NERVE:  2nd, 3rd, 4th, 6th - Visual fields full to confrontation, extraocular muscles intact, no nystagmus 5th - facial sensation symmetric 7th - facial strength symmetric 8th - hearing intact 9th - palate elevates symmetrically, uvula midline 11th - shoulder shrug symmetric 12th - tongue protrusion midline  MOTOR:  normal bulk and tone, full strength in the BUE, BLE  SENSORY:  normal and symmetric to light touch  COORDINATION:  finger-nose-finger, fine finger movements normal  GAIT/STATION:  Wide base, has to hold on the wall to walk.  Unable to tandem.  Positive Romberg     DIAGNOSTIC DATA (LABS, IMAGING, TESTING) - I reviewed patient records, labs, notes, testing and imaging myself where available.  Lab Results  Component Value Date   WBC 9.1 09/20/2024   HGB 14.4 09/20/2024   HCT 41.0 09/20/2024   MCV 94.0 09/20/2024   PLT 228 09/20/2024      Component Value Date/Time   NA 138 09/20/2024 0656   NA 133 (L) 07/01/2022 1039   K 3.7 09/20/2024 0656   CL 104 09/20/2024 0656   CO2 20 (L) 09/20/2024 0656   GLUCOSE 128 (H) 09/20/2024 0656   BUN 16 09/20/2024 0656   BUN 7 (L) 07/01/2022 1039   CREATININE 1.27 (H) 09/20/2024 0656   CALCIUM  8.6 (L) 09/20/2024 0656   PROT 6.7 09/17/2024 1130   PROT 7.1 07/01/2022 1039   ALBUMIN 3.9 09/17/2024 1130   ALBUMIN 4.5 07/01/2022 1039    AST 25 09/17/2024 1130   ALT 15 09/17/2024 1130   ALKPHOS 55 09/17/2024 1130   BILITOT 1.1 09/17/2024 1130   BILITOT 0.8 07/01/2022 1039   GFRNONAA >60 09/20/2024 0656   GFRAA 58 (L) 09/08/2020 0910   Lab Results  Component Value Date   CHOL 121 09/20/2024   HDL 55 09/20/2024   LDLCALC 59 09/20/2024   TRIG 36 09/20/2024   CHOLHDL 2.2 09/20/2024   Lab Results  Component Value Date   HGBA1C 4.2 (L) 06/07/2024   Lab Results  Component Value Date   VITAMINB12 180 06/07/2024   Lab Results  Component Value Date   TSH 0.872 07/01/2022    MRI Brain 06/06/2024 Multiple acute infarcts in the left MCA and left PCA territories, including the left basal ganglia, left posterior limb of the internal capsule, left anterior insula, overlying left frontal and parietal lobes and the left occipital lobe. No mass effect.  CTA Head and Neck 06/06/2024 1. Occluded proximal left M3 MCA. 2. Critical, nearly occlusive stenosis at the left carotid bifurcation. The degree of stenosis is difficult to quantify given the degree of bulky calcific atherosclerosis but likely at least 80%. 3. Approximately 55% stenosis of the right ICA. 4. Moderate right V2 vertebral artery stenosis due to compression from adjacent osteophyte  ASSESSMENT AND PLAN  69 y.o. year old male with hypertension, hyperlipidemia, COPD, alcohol and nicotine  use who is presenting for follow-up after left hemispheric stroke and-status post left carotid endarterectomy.  Continue aspirin  and Plavix .  He was prescribed Lipitor but tells me it caused muscle ache, so he self discontinued.   Multiple left hemispheric ischemic strokes with cognitive and visual deficits Multiple ischemic strokes affecting the left hemisphere, resulting in cognitive deficits and visual impairments.  Stroke etiology likely large vessel disease but cannot rule out cardioembolic.  He is status post left carotid endarterectomy.  He still complain of speech  difficulty, brain fog, confusion and gait abnormality leading to multiple falls - Referred to speech therapy for cognitive rehabilitation. - Continue Plavix  and aspirin  for stroke prevention.  Gait instability and recurrent falls Gait instability and recurrent falls, likely exacerbated by cognitive deficits and possible alcohol use. Falls occur when standing up, possibly due to dizziness and balance issues. - Referred to physical therapy for gait training and fall prevention.  Alcohol dependence Chronic alcohol dependence with recent reduction in intake. Alcohol use contributes to balance issues and increases the risk of further neurological complications. He is aware of the need to reduce alcohol intake but finds it challenging to quit completely. - Encouraged reduction of alcohol intake to minimize neurological risks.  Tobacco use Continued tobacco use, which increases the risk of further strokes. He has reduced smoking but continues to smoke. - Advised cessation of smoking to reduce stroke risk.  Dyslipidemia Management complicated by muscle aches from cholesterol medication.  Atorvastatin  causes leg aches, leading to non-compliance. - Will switch to Crestor  10 mg   1. Cerebrovascular accident (CVA) due to occlusion of left middle cerebral artery (HCC)   2. Confusion   3. Balance disorder   4. Recurrent falls   5. Alcohol use disorder   6. Nicotine  use      Patient Instructions  Continue current medications including Aspirin  and Plavix  Will refer patient to speech therapy for his cognitive complains and word finding difficulty Referral to physical therapy for gait abnormality and recurrent falls Will switch Lipitor to Crestor  10 mg Continue follow-up PCP Return sooner if symptoms do get worse.  Orders Placed This Encounter  Procedures   Ambulatory referral to Speech Therapy   Ambulatory referral to Physical Therapy    Meds ordered this encounter  Medications    rosuvastatin  (CRESTOR ) 10 MG tablet    Sig: Take 1 tablet (10 mg total) by mouth at bedtime.    Dispense:  90 tablet    Refill:  3    Return if symptoms worsen or fail to improve.    Pastor Falling, MD 12/23/2024, 4:53 PM  [facility] [address], [address] [place], [place] [address] [phone]     [1] No Known Allergies  "

## 2024-12-23 NOTE — Telephone Encounter (Signed)
Referral for physical therapy sent through [facility] to [facility]. Phone: [phone]

## 2025-01-09 ENCOUNTER — Other Ambulatory Visit: Payer: Self-pay | Admitting: Family Medicine

## 2025-01-09 DIAGNOSIS — J439 Emphysema, unspecified: Secondary | ICD-10-CM

## 2025-01-10 ENCOUNTER — Other Ambulatory Visit: Payer: Self-pay

## 2025-01-10 DIAGNOSIS — J439 Emphysema, unspecified: Secondary | ICD-10-CM

## 2025-01-10 MED ORDER — ALBUTEROL SULFATE HFA 108 (90 BASE) MCG/ACT IN AERS: 1.0000 | INHALATION_SPRAY | Freq: Four times a day (QID) | RESPIRATORY_TRACT | 1 refills | Status: AC | PRN

## 2025-01-20 ENCOUNTER — Other Ambulatory Visit: Payer: Self-pay | Admitting: Pharmacist

## 2025-01-20 NOTE — Progress Notes (Signed)
 Pharmacy Quality Measure Review  This patient is appearing on a report for the adherence measure for cholesterol (statin) medications this calendar year.   Medication: rosuvastatin  Last fill date: 12/24/2024 for 30 day supply  Insurance report was not up to date. No action needed at this time. Reminder set to contact [facility] later this week to see if refill can be processed.   Herlene Fleeta Morris, PharmD, JAQUELINE, CPP Clinical Pharmacist [facility] & [facility] [phone]

## 2025-01-24 ENCOUNTER — Other Ambulatory Visit: Payer: Self-pay | Admitting: Pharmacist

## 2025-01-24 NOTE — Progress Notes (Signed)
 Pharmacy Quality Measure Review  This patient is appearing on a report for the adherence measure for cholesterol (statin) medications this calendar year.   Medication: rosuvastatin  Last fill date: 12/24/2024 for 30 day supply  Of note, patient was on atorvastatin  before being changed to rosuvastatin  on 12/23/2024 by Neurologist. Pt was endorsing side effects to the atorvastatin .   Unfortunately, it looks like [facility] filled atorvastatin  on 01/09/2025. I called the pharmacy this morning and had them discontinue the atorvastatin  in their system. They will fill the rosuvastatin  from now on instead. Reminder set to make sure he got rosuvastatin  next week.  Herlene Fleeta Morris, PharmD, JAQUELINE, CPP Clinical Pharmacist [facility] & [facility] [phone]

## 2025-01-29 ENCOUNTER — Ambulatory Visit: Admitting: Speech Pathology

## 2025-01-29 ENCOUNTER — Ambulatory Visit: Admitting: Physical Therapy

## 2025-02-18 ENCOUNTER — Ambulatory Visit: Admitting: Physical Medicine & Rehabilitation
